# Patient Record
Sex: Female | Born: 1950 | Race: White | Hispanic: No | Marital: Married | Smoking: Former smoker
Health system: Southern US, Community
[De-identification: ages and names within clinical notes are randomized; demographics above are authoritative.]

## PROBLEM LIST (undated history)

## (undated) DIAGNOSIS — I5022 Chronic systolic (congestive) heart failure: Secondary | ICD-10-CM

## (undated) DIAGNOSIS — E079 Disorder of thyroid, unspecified: Secondary | ICD-10-CM

## (undated) DIAGNOSIS — F10920 Alcohol use, unspecified with intoxication, uncomplicated: Secondary | ICD-10-CM

## (undated) DIAGNOSIS — I509 Heart failure, unspecified: Secondary | ICD-10-CM

## (undated) DIAGNOSIS — H269 Unspecified cataract: Secondary | ICD-10-CM

## (undated) DIAGNOSIS — I255 Ischemic cardiomyopathy: Secondary | ICD-10-CM

## (undated) DIAGNOSIS — I214 Non-ST elevation (NSTEMI) myocardial infarction: Secondary | ICD-10-CM

## (undated) DIAGNOSIS — E785 Hyperlipidemia, unspecified: Secondary | ICD-10-CM

## (undated) DIAGNOSIS — S0181XA Laceration without foreign body of other part of head, initial encounter: Secondary | ICD-10-CM

## (undated) DIAGNOSIS — I10 Essential (primary) hypertension: Secondary | ICD-10-CM

## (undated) DIAGNOSIS — Z95810 Presence of automatic (implantable) cardiac defibrillator: Secondary | ICD-10-CM

## (undated) DIAGNOSIS — I200 Unstable angina: Secondary | ICD-10-CM

## (undated) DIAGNOSIS — R55 Syncope and collapse: Secondary | ICD-10-CM

## (undated) DIAGNOSIS — R9431 Abnormal electrocardiogram [ECG] [EKG]: Secondary | ICD-10-CM

## (undated) DIAGNOSIS — E871 Hypo-osmolality and hyponatremia: Secondary | ICD-10-CM

## (undated) DIAGNOSIS — I2510 Atherosclerotic heart disease of native coronary artery without angina pectoris: Secondary | ICD-10-CM

## (undated) DIAGNOSIS — I25118 Atherosclerotic heart disease of native coronary artery with other forms of angina pectoris: Secondary | ICD-10-CM

## (undated) DIAGNOSIS — W19XXXA Unspecified fall, initial encounter: Secondary | ICD-10-CM

## (undated) DIAGNOSIS — T7840XA Allergy, unspecified, initial encounter: Secondary | ICD-10-CM

## (undated) DIAGNOSIS — E039 Hypothyroidism, unspecified: Secondary | ICD-10-CM

## (undated) DIAGNOSIS — R079 Chest pain, unspecified: Secondary | ICD-10-CM

## (undated) HISTORY — DX: Chronic systolic (congestive) heart failure: I50.22

## (undated) HISTORY — DX: Ischemic cardiomyopathy: I25.5

## (undated) HISTORY — DX: Laceration without foreign body of other part of head, initial encounter: S01.81XA

## (undated) HISTORY — DX: Alcohol use, unspecified with intoxication, uncomplicated: F10.920

## (undated) HISTORY — DX: Hypothyroidism, unspecified: E03.9

## (undated) HISTORY — DX: Hypo-osmolality and hyponatremia: E87.1

## (undated) HISTORY — DX: Unspecified cataract: H26.9

## (undated) HISTORY — DX: Unspecified fall, initial encounter: W19.XXXA

## (undated) HISTORY — DX: Heart failure, unspecified: I50.9

## (undated) HISTORY — PX: CORONARY ANGIOPLASTY: SHX604

## (undated) HISTORY — DX: Non-ST elevation (NSTEMI) myocardial infarction: I21.4

## (undated) HISTORY — DX: Allergy, unspecified, initial encounter: T78.40XA

## (undated) HISTORY — DX: Abnormal electrocardiogram (ECG) (EKG): R94.31

## (undated) HISTORY — PX: OTHER SURGICAL HISTORY: SHX169

## (undated) HISTORY — PX: CATARACT EXTRACTION, BILATERAL: SHX1313

## (undated) HISTORY — DX: Syncope and collapse: R55

## (undated) HISTORY — PX: HAND SURGERY: SHX662

## (undated) HISTORY — PX: VAGINAL HYSTERECTOMY: SUR661

## (undated) HISTORY — DX: Unstable angina: I20.0

## (undated) HISTORY — DX: Atherosclerotic heart disease of native coronary artery with other forms of angina pectoris: I25.118

## (undated) HISTORY — DX: Chest pain, unspecified: R07.9

## (undated) HISTORY — DX: Essential (primary) hypertension: I10

---

## 2014-04-13 ENCOUNTER — Emergency Department (HOSPITAL_COMMUNITY)
Admission: EM | Admit: 2014-04-13 | Discharge: 2014-04-13 | Disposition: A | Discharge status: Home / Self Care | Payer: Federal, State, Local not specified - PPO | Attending: Emergency Medicine | Admitting: Emergency Medicine

## 2014-04-13 ENCOUNTER — Encounter (HOSPITAL_COMMUNITY): Payer: Self-pay | Admitting: Emergency Medicine

## 2014-04-13 ENCOUNTER — Emergency Department (HOSPITAL_COMMUNITY): Payer: Federal, State, Local not specified - PPO

## 2014-04-13 DIAGNOSIS — Y9289 Other specified places as the place of occurrence of the external cause: Secondary | ICD-10-CM | POA: Diagnosis not present

## 2014-04-13 DIAGNOSIS — S99919A Unspecified injury of unspecified ankle, initial encounter: Secondary | ICD-10-CM | POA: Diagnosis not present

## 2014-04-13 DIAGNOSIS — Z862 Personal history of diseases of the blood and blood-forming organs and certain disorders involving the immune mechanism: Secondary | ICD-10-CM | POA: Insufficient documentation

## 2014-04-13 DIAGNOSIS — X500XXA Overexertion from strenuous movement or load, initial encounter: Secondary | ICD-10-CM | POA: Diagnosis not present

## 2014-04-13 DIAGNOSIS — F17200 Nicotine dependence, unspecified, uncomplicated: Secondary | ICD-10-CM | POA: Diagnosis not present

## 2014-04-13 DIAGNOSIS — Y9389 Activity, other specified: Secondary | ICD-10-CM | POA: Diagnosis not present

## 2014-04-13 DIAGNOSIS — S8990XA Unspecified injury of unspecified lower leg, initial encounter: Secondary | ICD-10-CM | POA: Insufficient documentation

## 2014-04-13 DIAGNOSIS — R296 Repeated falls: Secondary | ICD-10-CM | POA: Diagnosis not present

## 2014-04-13 DIAGNOSIS — Z8639 Personal history of other endocrine, nutritional and metabolic disease: Secondary | ICD-10-CM | POA: Insufficient documentation

## 2014-04-13 DIAGNOSIS — S99921A Unspecified injury of right foot, initial encounter: Secondary | ICD-10-CM

## 2014-04-13 DIAGNOSIS — S99929A Unspecified injury of unspecified foot, initial encounter: Principal | ICD-10-CM

## 2014-04-13 HISTORY — DX: Disorder of thyroid, unspecified: E07.9

## 2014-04-13 NOTE — ED Provider Notes (Signed)
CSN: [id number]     Arrival date & time 04/13/14  6503 History   First MD Initiated Contact with Patient 04/13/14 0719     Chief Complaint  Patient presents with  . Foot Injury     (Consider location/radiation/quality/duration/timing/severity/associated sxs/prior Treatment) HPI 63 [place] Assessment 63 year old female who presents to the emergency department with chief complaint of right foot injury. Injury occurrence 10 hours ago. Patient states that she stepped wrong getting out of a chair and had an inversion of the right foot. She states that she did not have very much pain at the time of the injury and was able to walk and do normal activities last night. This morning when she awoke she noticed a large hematoma and swelling on the foot. She denies severe pain. She is able to ambulate however states that certain movements give her a small amount of pain. She denies numbness or tingling. She denies previous injury to the foot.  Past Medical History  Diagnosis Date  . Thyroid disease    History reviewed. No pertinent past surgical history. No family history on file. History  Substance Use Topics  . Smoking status: Current Every Day Smoker  . Smokeless tobacco: Not on file  . Alcohol Use: Yes   OB History   Grav Para Term Preterm Abortions TAB SAB Ect Mult Living                 Review of Systems  Ten systems reviewed and are negative for acute change, except as noted in the HPI.    Allergies  Review of patient's allergies indicates no known allergies.  Home Medications   Prior to Admission medications   Not on File   BP 144/62  Pulse 82  Temp(Src) 98.2 F (36.8 C) (Oral)  Resp 16  Ht 5\' 3"  (1.6 m)  Wt 120 lb (54.432 kg)  BMI 21.26 kg/m2  SpO2 95% Physical Exam  Nursing note and vitals reviewed. Constitutional: She is oriented to person, place, and time. She appears well-developed and well-nourished. No distress.  HENT:  Head: Normocephalic and atraumatic.   Eyes: Conjunctivae are normal. No scleral icterus.  Neck: Normal range of motion.  Cardiovascular: Normal rate, regular rhythm and normal heart sounds.  Exam reveals no gallop and no friction rub.   No murmur heard. Pulmonary/Chest: Effort normal and breath sounds normal. No respiratory distress.  Abdominal: Soft. Bowel sounds are normal. She exhibits no distension and no mass. There is no tenderness. There is no guarding.  Musculoskeletal: She exhibits edema and tenderness.  There is edema and tenderness of the proximal dorsum of the Right foot  without overt deformity. FROM and strength. Ambulates without difficulty. NVI  Neurological: She is alert and oriented to person, place, and time.  Skin: Skin is warm and dry. She is not diaphoretic.  Psychiatric: She has a normal mood and affect. Her behavior is normal.    ED Course  Procedures (including critical care time) Labs Review Labs Reviewed - No data to display  Imaging Review Dg Ankle Complete Right  04/13/2014   CLINICAL DATA:  Right ankle following fall  EXAM: RIGHT ANKLE - COMPLETE 3+ VIEW  COMPARISON:  None.  FINDINGS: Mild irregularity of the dorsal navicular is noted on the lateral and may represent small avulsion fracture of uncertain chronicity.  No other fracture, subluxation or dislocation identified.  No focal bony lesions are present.  Soft tissue structures are unremarkable.  IMPRESSION: Possible small avulsion fracture of the  dorsal navicular -age indeterminate. Correlate with pain.   Electronically Signed   By: Hassan Rowan M.D.   On: 04/13/2014 07:55   Dg Foot Complete Right  04/13/2014   CLINICAL DATA:  Right foot pain following fall  EXAM: RIGHT FOOT COMPLETE - 3+ VIEW  COMPARISON:  None.  FINDINGS: There is no evidence of fracture or dislocation. There is no evidence of arthropathy or other focal bone abnormality. Soft tissues are unremarkable.  IMPRESSION: Negative.   Electronically Signed   By: Hassan Rowan M.D.   On:  04/13/2014 07:46     EKG Interpretation None      MDM   Final diagnoses:  Foot injury, right, initial encounter    X-rays shows a small avulsion fracture of the navicular. This requires no intervention. Patient X-Ray negative for obvious fracture or dislocation. Pain managed in ED. Pt advised to follow up with orthopedics if symptoms persist for possibility of missed fracture diagnosis. Patient given Ace wrap while in ED, conservative therapy recommended and discussed. Patient will be dc home & is agreeable with above plan.   Margarita Mail, PA-C 04/13/14 [phone]

## 2014-04-13 NOTE — Discharge Instructions (Signed)
You have an avulsion fracture of the right navicular bone. This should heal on its own in 4-6 weeks without intervention. Follow up with Dr. Doran Durand if your symptoms worsen or do not resolve. The information below care instructions and reasons to seek immediate medical care at the emergency department.  Hematoma A hematoma is a collection of blood under the skin, in an organ, in a body space, in a joint space, or in other tissue. The blood can clot to form a lump that you can see and feel. The lump is often firm and may sometimes become sore and tender. Most hematomas get better in a few days to weeks. However, some hematomas may be serious and require medical care. Hematomas can range in size from very small to very large. CAUSES  A hematoma can be caused by a blunt or penetrating injury. It can also be caused by spontaneous leakage from a blood vessel under the skin. Spontaneous leakage from a blood vessel is more likely to occur in older people, especially those taking blood thinners. Sometimes, a hematoma can develop after certain medical procedures. SIGNS AND SYMPTOMS   A firm lump on the body.  Possible pain and tenderness in the area.  Bruising.Blue, dark blue, purple-red, or yellowish skin may appear at the site of the hematoma if the hematoma is close to the surface of the skin. For hematomas in deeper tissues or body spaces, the signs and symptoms may be subtle. For example, an intra-abdominal hematoma may cause abdominal pain, weakness, fainting, and shortness of breath. An intracranial hematoma may cause a headache or symptoms such as weakness, trouble speaking, or a change in consciousness. DIAGNOSIS  A hematoma can usually be diagnosed based on your medical history and a physical exam. Imaging tests may be needed if your health care provider suspects a hematoma in deeper tissues or body spaces, such as the abdomen, head, or chest. These tests may include ultrasonography or a CT scan.    TREATMENT  Hematomas usually go away on their own over time. Rarely does the blood need to be drained out of the body. Large hematomas or those that may affect vital organs will sometimes need surgical drainage or monitoring. HOME CARE INSTRUCTIONS   Apply ice to the injured area:   Put ice in a plastic bag.   Place a towel between your skin and the bag.   Leave the ice on for 20 minutes, 2-3 times a day for the first 1 to 2 days.   After the first 2 days, switch to using warm compresses on the hematoma.   Elevate the injured area to help decrease pain and swelling. Wrapping the area with an elastic bandage may also be helpful. Compression helps to reduce swelling and promotes shrinking of the hematoma. Make sure the bandage is not wrapped too tight.   If your hematoma is on a lower extremity and is painful, crutches may be helpful for a couple days.   Only take over-the-counter or prescription medicines as directed by your health care provider. SEEK IMMEDIATE MEDICAL CARE IF:   You have increasing pain, or your pain is not controlled with medicine.   You have a fever.   You have worsening swelling or discoloration.   Your skin over the hematoma breaks or starts bleeding.   Your hematoma is in your chest or abdomen and you have weakness, shortness of breath, or a change in consciousness.  Your hematoma is on your scalp (caused by a fall  or injury) and you have a worsening headache or a change in alertness or consciousness. MAKE SURE YOU:   Understand these instructions.  Will watch your condition.  Will get help right away if you are not doing well or get worse. Document Released: 03/01/2004 Document Revised: 03/20/2013 Document Reviewed: 12/26/2012 [facility] Patient Information 2015 [place], [place]. This information is not intended to replace advice given to you by your health care provider. Make sure you discuss any questions you have with your health care  provider.

## 2014-04-13 NOTE — ED Notes (Signed)
The pt fell yesterday and twisted her ankle.  She has pain and swelling with bruising tom her l;ateral foot

## 2014-04-13 NOTE — ED Provider Notes (Signed)
Medical screening examination/treatment/procedure(s) were performed by non-physician practitioner and as supervising physician I was immediately available for consultation/collaboration.   EKG Interpretation None       Richarda Blade, MD 04/13/14 1757

## 2016-07-07 ENCOUNTER — Inpatient Hospital Stay (HOSPITAL_COMMUNITY)
Admission: EM | Admit: 2016-07-07 | Discharge: 2016-07-12 | DRG: 281 | Disposition: A | Discharge status: Home / Self Care | Payer: Medicare Other | Attending: Internal Medicine | Admitting: Internal Medicine

## 2016-07-07 ENCOUNTER — Encounter (HOSPITAL_COMMUNITY): Payer: Self-pay | Admitting: Cardiovascular Disease

## 2016-07-07 ENCOUNTER — Encounter (HOSPITAL_COMMUNITY)
Admission: EM | Disposition: A | Discharge status: Home / Self Care | Payer: Self-pay | Source: Home / Self Care | Attending: Internal Medicine

## 2016-07-07 ENCOUNTER — Emergency Department (HOSPITAL_COMMUNITY): Payer: Medicare Other

## 2016-07-07 DIAGNOSIS — Z841 Family history of disorders of kidney and ureter: Secondary | ICD-10-CM

## 2016-07-07 DIAGNOSIS — Z23 Encounter for immunization: Secondary | ICD-10-CM

## 2016-07-07 DIAGNOSIS — R9431 Abnormal electrocardiogram [ECG] [EKG]: Secondary | ICD-10-CM | POA: Diagnosis not present

## 2016-07-07 DIAGNOSIS — E785 Hyperlipidemia, unspecified: Secondary | ICD-10-CM | POA: Diagnosis present

## 2016-07-07 DIAGNOSIS — I2584 Coronary atherosclerosis due to calcified coronary lesion: Secondary | ICD-10-CM | POA: Diagnosis present

## 2016-07-07 DIAGNOSIS — W19XXXA Unspecified fall, initial encounter: Secondary | ICD-10-CM | POA: Diagnosis present

## 2016-07-07 DIAGNOSIS — E039 Hypothyroidism, unspecified: Secondary | ICD-10-CM | POA: Diagnosis present

## 2016-07-07 DIAGNOSIS — F10229 Alcohol dependence with intoxication, unspecified: Secondary | ICD-10-CM | POA: Diagnosis present

## 2016-07-07 DIAGNOSIS — S022XXA Fracture of nasal bones, initial encounter for closed fracture: Secondary | ICD-10-CM | POA: Diagnosis present

## 2016-07-07 DIAGNOSIS — R55 Syncope and collapse: Secondary | ICD-10-CM | POA: Diagnosis not present

## 2016-07-07 DIAGNOSIS — F10920 Alcohol use, unspecified with intoxication, uncomplicated: Secondary | ICD-10-CM

## 2016-07-07 DIAGNOSIS — Y907 Blood alcohol level of 200-239 mg/100 ml: Secondary | ICD-10-CM | POA: Diagnosis present

## 2016-07-07 DIAGNOSIS — I252 Old myocardial infarction: Secondary | ICD-10-CM

## 2016-07-07 DIAGNOSIS — I5022 Chronic systolic (congestive) heart failure: Secondary | ICD-10-CM | POA: Diagnosis present

## 2016-07-07 DIAGNOSIS — F17210 Nicotine dependence, cigarettes, uncomplicated: Secondary | ICD-10-CM | POA: Diagnosis present

## 2016-07-07 DIAGNOSIS — W010XXA Fall on same level from slipping, tripping and stumbling without subsequent striking against object, initial encounter: Secondary | ICD-10-CM | POA: Diagnosis present

## 2016-07-07 DIAGNOSIS — I25118 Atherosclerotic heart disease of native coronary artery with other forms of angina pectoris: Secondary | ICD-10-CM

## 2016-07-07 DIAGNOSIS — I255 Ischemic cardiomyopathy: Secondary | ICD-10-CM | POA: Diagnosis present

## 2016-07-07 DIAGNOSIS — R4182 Altered mental status, unspecified: Secondary | ICD-10-CM | POA: Diagnosis not present

## 2016-07-07 DIAGNOSIS — I2582 Chronic total occlusion of coronary artery: Secondary | ICD-10-CM | POA: Diagnosis present

## 2016-07-07 DIAGNOSIS — S61412A Laceration without foreign body of left hand, initial encounter: Secondary | ICD-10-CM | POA: Diagnosis present

## 2016-07-07 DIAGNOSIS — S0181XA Laceration without foreign body of other part of head, initial encounter: Secondary | ICD-10-CM

## 2016-07-07 DIAGNOSIS — S0101XA Laceration without foreign body of scalp, initial encounter: Secondary | ICD-10-CM | POA: Diagnosis present

## 2016-07-07 DIAGNOSIS — I214 Non-ST elevation (NSTEMI) myocardial infarction: Secondary | ICD-10-CM | POA: Diagnosis not present

## 2016-07-07 DIAGNOSIS — I2510 Atherosclerotic heart disease of native coronary artery without angina pectoris: Secondary | ICD-10-CM | POA: Diagnosis present

## 2016-07-07 DIAGNOSIS — Z79899 Other long term (current) drug therapy: Secondary | ICD-10-CM

## 2016-07-07 DIAGNOSIS — I21A1 Myocardial infarction type 2: Secondary | ICD-10-CM | POA: Diagnosis present

## 2016-07-07 DIAGNOSIS — E871 Hypo-osmolality and hyponatremia: Secondary | ICD-10-CM

## 2016-07-07 HISTORY — DX: Unspecified fall, initial encounter: W19.XXXA

## 2016-07-07 HISTORY — DX: Hyperlipidemia, unspecified: E78.5

## 2016-07-07 LAB — CBC
HEMATOCRIT: 41.2 % (ref 36.0–46.0)
HEMOGLOBIN: 14.6 g/dL (ref 12.0–15.0)
MCH: 30.9 pg (ref 26.0–34.0)
MCHC: 35.4 g/dL (ref 30.0–36.0)
MCV: 87.3 fL (ref 78.0–100.0)
Platelets: 292 10*3/uL (ref 150–400)
RBC: 4.72 MIL/uL (ref 3.87–5.11)
RDW: 14.6 % (ref 11.5–15.5)
WBC: 14 10*3/uL — ABNORMAL HIGH (ref 4.0–10.5)

## 2016-07-07 LAB — COMPREHENSIVE METABOLIC PANEL
ALK PHOS: 58 U/L (ref 38–126)
ALT: 27 U/L (ref 14–54)
AST: 31 U/L (ref 15–41)
Albumin: 3.8 g/dL (ref 3.5–5.0)
Anion gap: 10 (ref 5–15)
BUN: 7 mg/dL (ref 6–20)
CHLORIDE: 94 mmol/L — AB (ref 101–111)
CO2: 21 mmol/L — AB (ref 22–32)
Calcium: 8.9 mg/dL (ref 8.9–10.3)
Creatinine, Ser: 0.68 mg/dL (ref 0.44–1.00)
GFR calc Af Amer: >60 mL/min (ref >60)
GFR calc non Af Amer: >60 mL/min (ref >60)
Glucose, Bld: 91 mg/dL (ref 65–99)
Potassium: 4.5 mmol/L (ref 3.5–5.1)
Sodium: 125 mmol/L — ABNORMAL LOW (ref 135–145)
Total Bilirubin: 0.8 mg/dL (ref 0.3–1.2)
Total Protein: 6.4 g/dL — ABNORMAL LOW (ref 6.5–8.1)

## 2016-07-07 LAB — DIFFERENTIAL
Basophils Absolute: 0.1 10*3/uL (ref 0.0–0.1)
Basophils Relative: 1 %
EOS ABS: 0.1 10*3/uL (ref 0.0–0.7)
Eosinophils Relative: 1 %
Lymphocytes Relative: 20 %
Lymphs Abs: 2.8 10*3/uL (ref 0.7–4.0)
MONO ABS: 0.7 10*3/uL (ref 0.1–1.0)
MONOS PCT: 5 %
Neutro Abs: 10.4 10*3/uL — ABNORMAL HIGH (ref 1.7–7.7)
Neutrophils Relative %: 73 %

## 2016-07-07 LAB — PROTIME-INR
INR: 1.04
Prothrombin Time: 13.7 seconds (ref 11.4–15.2)

## 2016-07-07 LAB — LIPID PANEL
CHOL/HDL RATIO: 2 ratio
Cholesterol: 174 mg/dL (ref 0–200)
HDL: 87 mg/dL (ref >40)
LDL CALC: 70 mg/dL (ref 0–99)
Triglycerides: 87 mg/dL (ref <150)
VLDL: 17 mg/dL (ref 0–40)

## 2016-07-07 LAB — APTT: APTT: 29 s (ref 24–36)

## 2016-07-07 LAB — TROPONIN I: Troponin I: <0.03 ng/mL (ref <0.03)

## 2016-07-07 LAB — ETHANOL: Alcohol, Ethyl (B): 221 mg/dL — ABNORMAL HIGH (ref <5)

## 2016-07-07 SURGERY — INVASIVE LAB ABORTED CASE

## 2016-07-07 MED ORDER — IOPAMIDOL (ISOVUE-370) INJECTION 76%
INTRAVENOUS | Status: AC
  Filled 2016-07-07: qty 200

## 2016-07-07 MED ORDER — SODIUM CHLORIDE 0.9 % IV SOLN
10.0000 mL/h | INTRAVENOUS | Status: DC
  Administered 2016-07-07: 10 mL/h via INTRAVENOUS

## 2016-07-07 MED ORDER — LIDOCAINE HCL (PF) 1 % IJ SOLN
INTRAMUSCULAR | Status: AC
  Filled 2016-07-07: qty 30

## 2016-07-07 MED ORDER — HEPARIN (PORCINE) IN NACL 2-0.9 UNIT/ML-% IJ SOLN
INTRAMUSCULAR | Status: AC
  Filled 2016-07-07: qty 1000

## 2016-07-07 MED ORDER — LIDOCAINE HCL (PF) 1 % IJ SOLN
30.0000 mL | Freq: Once | INTRAMUSCULAR | Status: AC
  Administered 2016-07-07: 30 mL via SUBCUTANEOUS
  Filled 2016-07-07: qty 30

## 2016-07-07 MED ORDER — NITROGLYCERIN 1 MG/10 ML FOR IR/CATH LAB
INTRA_ARTERIAL | Status: AC
  Filled 2016-07-07: qty 10

## 2016-07-07 MED ORDER — TETANUS-DIPHTH-ACELL PERTUSSIS 5-2.5-18.5 LF-MCG/0.5 IM SUSP
0.5000 mL | Freq: Once | INTRAMUSCULAR | Status: AC
  Administered 2016-07-07: 0.5 mL via INTRAMUSCULAR
  Filled 2016-07-07: qty 0.5

## 2016-07-07 MED ORDER — VERAPAMIL HCL 2.5 MG/ML IV SOLN
INTRAVENOUS | Status: AC
  Filled 2016-07-07: qty 2

## 2016-07-07 MED ORDER — ASPIRIN 325 MG PO TABS
325.0000 mg | ORAL_TABLET | Freq: Every day | ORAL | Status: DC
  Administered 2016-07-07 – 2016-07-08 (×2): 325 mg via ORAL
  Filled 2016-07-07 (×2): qty 1

## 2016-07-07 NOTE — ED Notes (Signed)
Pt placed on bedpan

## 2016-07-07 NOTE — ED Notes (Signed)
[facility] contacted to activate Code Stemi 

## 2016-07-07 NOTE — ED Triage Notes (Signed)
Patient came in [place] post fall. Patient had a few beers and shots. Patient was found down on sidewalk supine by husband. Unknown how long patient was down. Unknown LOC. Patient has no complaints of pain. EMS ekg showed elevated in v3, v4. Patient has trauma to her nose and forehead, abrasions to wrists and right knee. Patient confused but alert. Not on blood thinners.

## 2016-07-07 NOTE — Consult Note (Signed)
Patient ID: Pam [name] MRN: [id number]  Admit date: 07/07/2016  Primary Care Physician: Pcp Not In System  Primary Cardiologist: New  HPI: 65 yo female with history of tobacco abuse, HLD, hypothyroidism who presented to the ED via EMS with altered mental status, etoh intoxication. EKG with ST elevation precordial leads. Pt without chest pain or dyspnea. Slurred speech but oriented. Husband is present. He states she had been drinking. She went outside to carry out the trash and did not come back in. 15 minutes later he found her on the ground outside. She was awake but moaning. She has smoked 1 ppd for 50 years. Upon arrival to ED, head laceration noted. Pt taken to CT scanner for stat head CT. Head CT with no evidence of intracranial bleeding. Preliminary read by radiology with no cervical spine fractures. Her nose is broken. Her face is covered in blood. No prior cardiac issues.   During my exam, she has no complaints except for headache. No chest pain or dyspnea   Review of systems complete and found to be negative unless listed above   Past Medical History:  Diagnosis Date  . Hyperlipidemia   . Thyroid disease     Family History  Problem Relation Age of Onset  . Kidney failure Mother     Social History   Social History  . Marital status: Married    Spouse name: N/A  . Number of children: 2  . Years of education: N/A   Occupational History  . Retired-worked for [employer]    Social History Main Topics  . Smoking status: Current Every Day Smoker    Packs/day: 1.00    Years: 50.00    Types: Cigarettes  . Smokeless tobacco: Not on file  . Alcohol use Yes     Comment: she drinks 2-3 days per week  . Drug use: No  . Sexual activity: Not on file   Other Topics Concern  . Not on file   Social History Narrative  . No narrative on file    Past Surgical History:  Procedure Laterality Date  . CATARACT EXTRACTION, BILATERAL      No Known Allergies  Prior to  Admission Meds:  Lipitor Synthroid  Physical Exam: Blood pressure 140/83, pulse 83, resp. rate 14, SpO2 94 %.    General: Well developed, well nourished, awake.  HEENT: OP clear, mucus membranes moist  SKIN: warm, dry. No rashes. Laceration on forehead.  Neuro: She moves her fingers and toes Musculoskeletal: Moves all extremities Psychiatric: Mood and affect normal  Neck: No JVD, no carotid bruits, no thyromegaly, no lymphadenopathy.  Lungs:Clear bilaterally, no wheezes, rhonci, crackles  Cardiovascular: Regular rate and rhythm. No murmurs, gallops or rubs.  Abdomen:Soft. Bowel sounds present. Non-tender.  Extremities: No lower extremity edema. Pulses are 2 + in the bilateral DP/PT.   Labs:   Lab Results  Component Value Date   WBC 14.0 (H) 07/07/2016   HGB 14.6 07/07/2016   HCT 41.2 07/07/2016   MCV 87.3 07/07/2016   PLT 292 07/07/2016     CMP Latest Ref Rng & Units 07/07/2016  Glucose 65 - 99 mg/dL 91  BUN 6 - 20 mg/dL 7  Creatinine 0.44 - 1.00 mg/dL 0.68  Sodium 135 - 145 mmol/L 125(L)  Potassium 3.5 - 5.1 mmol/L 4.5  Chloride 101 - 111 mmol/L 94(L)  CO2 22 - 32 mmol/L 21(L)  Calcium 8.9 - 10.3 mg/dL 8.9  Total Protein 6.5 - 8.1  g/dL 6.4(L)  Total Bilirubin 0.3 - 1.2 mg/dL 0.8  Alkaline Phos 38 - 126 U/L 58  AST 15 - 41 U/L 31  ALT 14 - 54 U/L 27   Troponin: <0.03  EKG: sinus, poor R wave progression through the precordial leads with 0.5 mm ST elevation leads V2-V4.   ASSESSMENT AND PLAN:   1. Abnormal EKG/syncopal event: The patient was found on the ground at home by her husband. She had been drinking heavily tonight. She has had no recent illnesses. Her first EKG by EMS showed ST elevation in leads V2-V4. Code STEMI was called by the ED staff. Upon arrival the ST segment elevation has almost normalized. She is currently in a cervical collar and her c-spine has not yet been cleared. Her face is covered in blood from a forehead laceration. She has no chest pain or  dyspnea. Blood pressure is normal. No ectopy on telemetry. I cannot exclude ACS fully but it is reassuring that her first troponin which was drawn 60 minutes after her event is negative. I have spent 60 minutes in the ED tonight at the patients bedside speaking to her and her husband. She has a high probability of CAD given her history of heavy tobacco abuse. She does not currently have an indication for emergent cardiac cath. She is not an ideal candidate for cath right now anyway given the other issues following her fall.  This could have been a primary arrhythmic event related to coronary vasospasm.  I would have the IM team admit to telemetry. Follow serial cardiac enzymes and serial EKGs.  No heparin right now given her traumatic injuries.  Echo in the am. She may end up needing a cardiac cath before discharge.   Code stemi cancelled.   Darlina Guys, MD 07/07/2016, 8:29 PM

## 2016-07-07 NOTE — ED Notes (Signed)
Gold color ring and gold color earrings removed and given to spouse.

## 2016-07-07 NOTE — ED Provider Notes (Signed)
[facility] DEPT Provider Note   CSN: [id number] Arrival date & time: 07/07/16  [id number]     History   Chief Complaint Chief Complaint  Patient presents with  . Fall  . Facial Injury    HPI Pam [name] is a 65 y.o. female.  HPI  Patient is a 65 year old female who presents via EMS after a fall with possible syncope. Patient is very intoxicated on arrival and unable to provide further history. History obtained from EMS and the patient's husband. Per report, patient had had several shots of alcohol and then attempted to take out the trash. Husband realized he had not seen patient for about 15 minutes, went in search of her and found her lying on her back in the yard with bleeding facial lacerations. She was alert, but confused and unable to get up off the ground. EMS was called. EKG performed by EMS prior to arrival was concerning for possible ST elevations in anterior and lateral leads. Cardiology was alerted prior to patient arrival. On presentation patient is oriented to self only. She admits to drinking alcohol but is amnestic to the other events of today.  Past Medical History:  Diagnosis Date  . Hyperlipidemia   . Thyroid disease     Patient Active Problem List   Diagnosis Date Noted  . Fall 07/07/2016  . Abnormal EKG   . Syncope     Past Surgical History:  Procedure Laterality Date  . ABDOMINAL HYSTERECTOMY    . CATARACT EXTRACTION, BILATERAL      OB History    No data available       Home Medications    Prior to Admission medications   Medication Sig Start Date End Date Taking? Authorizing Provider  atorvastatin (LIPITOR) 40 MG tablet Take 40 mg by mouth daily.   Yes Historical Provider, MD  levothyroxine (SYNTHROID, LEVOTHROID) 25 MCG tablet Take 25 mcg by mouth daily before breakfast.   Yes Historical Provider, MD    Family History Family History  Problem Relation Age of Onset  . Kidney failure Mother     Social History Social History  Substance  Use Topics  . Smoking status: Current Every Day Smoker    Packs/day: 1.00    Years: 50.00    Types: Cigarettes  . Smokeless tobacco: Never Used  . Alcohol use Yes     Comment: she drinks 2-3 days per week     Allergies   Patient has no known allergies.   Review of Systems Review of Systems  Skin: Positive for wound.   ROS limited due to patient intoxicated on arrival.   Physical Exam Updated Vital Signs BP 132/83   Pulse 87   Resp 16   Ht 5\' 3"  (1.6 m)   Wt 54.9 kg   SpO2 94%   BMI 21.43 kg/m   Physical Exam  Constitutional:  Alert, smells strongly of alcohol.   HENT:  Head: Normocephalic.  Abrasion over left and middle forehead with venous oozing. Small irregular laceration over bridge of nose, also with minimal venous oozing.   Eyes: EOM are normal. Pupils are equal, round, and reactive to light.  Neck:  Cervical collar in place  Cardiovascular: Normal rate, regular rhythm, normal heart sounds and intact distal pulses.   No murmur heard. Pulmonary/Chest: Effort normal and breath sounds normal. No respiratory distress. She has no wheezes. She has no rales. She exhibits no tenderness.  Abdominal: Soft. She exhibits no distension. There is no tenderness.  Musculoskeletal:  Abrasions over bilateral dorsal hands. Small 1cm avulson type laceration over dorsum of left hand. Scattered abrasions on bilateral lower extremities.  No bony deformity or TTP over extremities. Pelvis stable to AP/lateral compression. No stepoffs on spine exam.   Neurological: She is alert.  Oriented to self only. Speech is slurred and patient appears intoxicated. At least 3/5 strength in all extremities. Sensation intact to light touch throughout.   Skin: Skin is warm and dry. Capillary refill takes less than 2 seconds.     ED Treatments / Results  Labs (all labs ordered are listed, but only abnormal results are displayed) Labs Reviewed  CBC - Abnormal; Notable for the following:        Result Value   WBC 14.0 (*)    All other components within normal limits  DIFFERENTIAL - Abnormal; Notable for the following:    Neutro Abs 10.4 (*)    All other components within normal limits  COMPREHENSIVE METABOLIC PANEL - Abnormal; Notable for the following:    Sodium 125 (*)    Chloride 94 (*)    CO2 21 (*)    Total Protein 6.4 (*)    All other components within normal limits  ETHANOL - Abnormal; Notable for the following:    Alcohol, Ethyl (B) 221 (*)    All other components within normal limits  PROTIME-INR  APTT  TROPONIN I  LIPID PANEL    EKG  EKG Interpretation  Date/Time:  Thursday July 07 2016 19:12:02 EST Ventricular Rate:  89 PR Interval:    QRS Duration: 103 QT Interval:  400 QTC Calculation: 487 R Axis:   78 Text Interpretation:  Atrial fibrillation Probable anteroseptal infarct, recent Lateral leads are also involved Baseline wander in lead(s) V6 ** ** ACUTE MI / STEMI ** ** Confirmed by LONG MD, JOSHUA [phone]) on 07/07/2016 7:16:25 PM       Radiology Ct Head Wo Contrast  Result Date: 07/07/2016 CLINICAL DATA:  Patient found down on the ground after alcohol consumption. EXAM: CT HEAD WITHOUT CONTRAST CT MAXILLOFACIAL WITHOUT CONTRAST CT CERVICAL SPINE WITHOUT CONTRAST TECHNIQUE: Multidetector CT imaging of the head, cervical spine, and maxillofacial structures were performed using the standard protocol without intravenous contrast. Multiplanar CT image reconstructions of the cervical spine and maxillofacial structures were also generated. COMPARISON:  None. FINDINGS: CT HEAD FINDINGS Brain: No evidence of acute infarction, hemorrhage, hydrocephalus, extra-axial collection or mass lesion/mass effect. Mild generalized atrophy. Vascular: Atherosclerosis of skullbase vasculature without hyperdense vessel or abnormal calcification. Skull: Right frontal scalp hematoma without calvarial fracture. Other: None. CT MAXILLOFACIAL FINDINGS Osseous: Nasal bone fracture,  mildly displaced. There is mild rightward nasal septal swelling with possible fracture posteriorly, no nasal septal hematoma. No additional facial bone fracture. Zygomatic arches are intact. No mandibular fracture, temporomandibular joints are congruent. Orbits: Right supraorbital soft tissue edema. No orbital fracture. Both globes are intact. Sinuses: Clear. Soft tissues: Soft tissue edema about the chin. CT CERVICAL SPINE FINDINGS Alignment: Straightening of normal lordosis. No listhesis, jumped or perched facets. Skull base and vertebrae: No acute fracture. No primary bone lesion or focal pathologic process. Soft tissues and spinal canal: No prevertebral fluid or swelling. No visible canal hematoma. Disc levels: Multilevel disc space narrowing and endplate spurring is most prominent at C5-C6 and C6-C7. There is multilevel facet arthropathy. Upper chest: Emphysema, advanced. Azygos fissure is incidentally noted. Other: Carotid atherosclerosis. IMPRESSION: 1. No acute intracranial abnormality. Right frontal scalp hematoma without skull fracture. 2. Minimally depressed nasal bone fracture.  No additional facial bone fracture. 3. Degenerative change throughout cervical spine. No acute fracture or subluxation. 4. Emphysema noted at the lung apices. 5. Carotid and intracranial atherosclerosis. Electronically Signed   By: Jeb Levering M.D.   On: 07/07/2016 20:38   Ct Cervical Spine Wo Contrast  Result Date: 07/07/2016 CLINICAL DATA:  Patient found down on the ground after alcohol consumption. EXAM: CT HEAD WITHOUT CONTRAST CT MAXILLOFACIAL WITHOUT CONTRAST CT CERVICAL SPINE WITHOUT CONTRAST TECHNIQUE: Multidetector CT imaging of the head, cervical spine, and maxillofacial structures were performed using the standard protocol without intravenous contrast. Multiplanar CT image reconstructions of the cervical spine and maxillofacial structures were also generated. COMPARISON:  None. FINDINGS: CT HEAD FINDINGS Brain:  No evidence of acute infarction, hemorrhage, hydrocephalus, extra-axial collection or mass lesion/mass effect. Mild generalized atrophy. Vascular: Atherosclerosis of skullbase vasculature without hyperdense vessel or abnormal calcification. Skull: Right frontal scalp hematoma without calvarial fracture. Other: None. CT MAXILLOFACIAL FINDINGS Osseous: Nasal bone fracture, mildly displaced. There is mild rightward nasal septal swelling with possible fracture posteriorly, no nasal septal hematoma. No additional facial bone fracture. Zygomatic arches are intact. No mandibular fracture, temporomandibular joints are congruent. Orbits: Right supraorbital soft tissue edema. No orbital fracture. Both globes are intact. Sinuses: Clear. Soft tissues: Soft tissue edema about the chin. CT CERVICAL SPINE FINDINGS Alignment: Straightening of normal lordosis. No listhesis, jumped or perched facets. Skull base and vertebrae: No acute fracture. No primary bone lesion or focal pathologic process. Soft tissues and spinal canal: No prevertebral fluid or swelling. No visible canal hematoma. Disc levels: Multilevel disc space narrowing and endplate spurring is most prominent at C5-C6 and C6-C7. There is multilevel facet arthropathy. Upper chest: Emphysema, advanced. Azygos fissure is incidentally noted. Other: Carotid atherosclerosis. IMPRESSION: 1. No acute intracranial abnormality. Right frontal scalp hematoma without skull fracture. 2. Minimally depressed nasal bone fracture. No additional facial bone fracture. 3. Degenerative change throughout cervical spine. No acute fracture or subluxation. 4. Emphysema noted at the lung apices. 5. Carotid and intracranial atherosclerosis. Electronically Signed   By: Jeb Levering M.D.   On: 07/07/2016 20:38   Ct Maxillofacial Wo Contrast  Result Date: 07/07/2016 CLINICAL DATA:  Patient found down on the ground after alcohol consumption. EXAM: CT HEAD WITHOUT CONTRAST CT MAXILLOFACIAL WITHOUT  CONTRAST CT CERVICAL SPINE WITHOUT CONTRAST TECHNIQUE: Multidetector CT imaging of the head, cervical spine, and maxillofacial structures were performed using the standard protocol without intravenous contrast. Multiplanar CT image reconstructions of the cervical spine and maxillofacial structures were also generated. COMPARISON:  None. FINDINGS: CT HEAD FINDINGS Brain: No evidence of acute infarction, hemorrhage, hydrocephalus, extra-axial collection or mass lesion/mass effect. Mild generalized atrophy. Vascular: Atherosclerosis of skullbase vasculature without hyperdense vessel or abnormal calcification. Skull: Right frontal scalp hematoma without calvarial fracture. Other: None. CT MAXILLOFACIAL FINDINGS Osseous: Nasal bone fracture, mildly displaced. There is mild rightward nasal septal swelling with possible fracture posteriorly, no nasal septal hematoma. No additional facial bone fracture. Zygomatic arches are intact. No mandibular fracture, temporomandibular joints are congruent. Orbits: Right supraorbital soft tissue edema. No orbital fracture. Both globes are intact. Sinuses: Clear. Soft tissues: Soft tissue edema about the chin. CT CERVICAL SPINE FINDINGS Alignment: Straightening of normal lordosis. No listhesis, jumped or perched facets. Skull base and vertebrae: No acute fracture. No primary bone lesion or focal pathologic process. Soft tissues and spinal canal: No prevertebral fluid or swelling. No visible canal hematoma. Disc levels: Multilevel disc space narrowing and endplate spurring is most prominent at C5-C6 and  C6-C7. There is multilevel facet arthropathy. Upper chest: Emphysema, advanced. Azygos fissure is incidentally noted. Other: Carotid atherosclerosis. IMPRESSION: 1. No acute intracranial abnormality. Right frontal scalp hematoma without skull fracture. 2. Minimally depressed nasal bone fracture. No additional facial bone fracture. 3. Degenerative change throughout cervical spine. No acute  fracture or subluxation. 4. Emphysema noted at the lung apices. 5. Carotid and intracranial atherosclerosis. Electronically Signed   By: Jeb Levering M.D.   On: 07/07/2016 20:38    Procedures .Marland KitchenLaceration Repair Date/Time: 07/08/2016 12:42 AM Performed by: Tamala Julian, Priya Matsen B Authorized by: Darnelle Bos B   Consent:    Consent obtained:  Verbal   Consent given by:  Patient Anesthesia (see MAR for exact dosages):    Anesthesia method:  Local infiltration   Local anesthetic:  Lidocaine 1% w/o epi Laceration details:    Location:  Face   Face location:  Nose   Length (cm):  3 Repair type:    Repair type:  Simple Pre-procedure details:    Preparation:  Patient was prepped and draped in usual sterile fashion Exploration:    Contaminated: no   Treatment:    Area cleansed with:  Saline   Amount of cleaning:  Standard   Irrigation solution:  Sterile saline and tap water   Irrigation method:  Syringe Skin repair:    Repair method:  Sutures   Suture size:  5-0   Suture material:  Nylon   Suture technique:  Simple interrupted   Number of sutures:  3 Approximation:    Approximation:  Close Post-procedure details:    Patient tolerance of procedure:  Tolerated well, no immediate complications .Marland KitchenLaceration Repair Date/Time: 07/08/2016 12:43 AM Performed by: Tamala Julian, Vaeda Westall B Authorized by: Darnelle Bos B   Consent:    Consent obtained:  Verbal   Consent given by:  Patient Laceration details:    Location:  Hand   Hand location:  L hand, dorsum   Length (cm):  1 Repair type:    Repair type:  Simple Pre-procedure details:    Preparation:  Patient was prepped and draped in usual sterile fashion Treatment:    Area cleansed with:  Saline   Amount of cleaning:  Standard   Irrigation solution:  Sterile saline   Irrigation method:  Syringe Skin repair:    Repair method:  Sutures   Suture size:  4-0   Suture material:  Prolene   Number of sutures:  1 Approximation:    Approximation:   Loose Post-procedure details:    Patient tolerance of procedure:  Tolerated well, no immediate complications   (including critical care time)  Medications Ordered in ED Medications  0.9 %  sodium chloride infusion (10 mL/hr Intravenous New Bag/Given 07/07/16 1956)  aspirin tablet 325 mg (325 mg Oral Given 07/07/16 2158)  Tdap (BOOSTRIX) injection 0.5 mL (0.5 mLs Intramuscular Given 07/07/16 1951)  lidocaine (PF) (XYLOCAINE) 1 % injection 30 mL (30 mLs Subcutaneous Given 07/07/16 2245)     Initial Impression / Assessment and Plan / ED Course  I have reviewed the triage vital signs and the nursing notes.  Pertinent labs & imaging results that were available during my care of the patient were reviewed by me and considered in my medical decision making (see chart for details).  Clinical Course    The patient is a 65 year old female possible history of hypokalemia and thyroid disease who presents with abnormal EKG and facial lacerations after a fall as described above. Unclear if syncopal episode or mechanical  fall due to intoxication. EKG obtained by EMS prior to arrival was concerning for ST elevations in anterior and lateral leads. Cardiology (Dr. Angelena Form) consulted prior to patient arrival.  On presentation patient is clearly intoxicated, with lacerations over nose and forehead as described above. She has scattered abrasions on her studies were no other signs of trauma. She denies chest pain but is unreliable due to severe intoxication. EKG was repeated and continues to show possible ST elevations in V2, V3 V4 but with considerable artifact. Cardiology reviewed EKG. As patient had a fall with obvious facial injuries recommended stat head CT to rule out intracranial bleed prior to proceeding with a cardiac intervention. CT head, face and cervical spine obtained. Positive for minimally depressed nasal bone fracture but no intracranial or spinal injuries. ASA given. EKG repeated and shows resolved  changes. Initial troponin undetectable. Given these results cardiology did not recommend acute intervention. Advised to admit patient for telemetry monitoring and trending cardiac enzymes.  Facial lacerations and left hand repaired as per procedure above. Will require suture removal in 5-7 days. Other labs reviewed - significant for elevated ETOH, mild hyponatremia. Leukocytosis likely associated with trauma.  [facility] hospitalist consulted for admission. Requested discussion of patient with trauma and ENT. Per trauma surgery discussion, no further interventions. Patient was discussed with Dr. Redmond Baseman (ENT) who does not recommend any acute intervention for nasal bone fracture. Patient will require outpatient follow-up with ENT after discharge for further evaluation. Patient will be admitted to the hospitalist service for telemetry observation and further cardiac rule out.   Patient seen and discussed with Dr. Laverta Baltimore, ED attending  Final Clinical Impressions(s) / ED Diagnoses   Final diagnoses:  Fall, initial encounter  Facial laceration, initial encounter  Alcoholic intoxication without complication ([place])  Hyponatremia    New Prescriptions New Prescriptions   No medications on file     Gibson Ramp, MD 07/08/16 [place], MD 07/08/16 [phone]

## 2016-07-07 NOTE — Progress Notes (Signed)
   07/07/16 1930  Clinical Encounter Type  Visited With Family  Visit Type ED  Spiritual Encounters  Spiritual Needs Emotional;Prayer  Stress Factors  Patient Stress Factors Not reviewed  Family Stress Factors Family relationships  Introduction to spouse. Pt. At procedure. Offered blessing and emotional support.

## 2016-07-07 NOTE — ED Notes (Signed)
PT ring still on Pt left ring-finger

## 2016-07-07 NOTE — ED Notes (Signed)
Po fluids provided.  

## 2016-07-08 ENCOUNTER — Encounter (HOSPITAL_COMMUNITY)
Admission: EM | Disposition: A | Discharge status: Home / Self Care | Payer: Self-pay | Source: Home / Self Care | Attending: Internal Medicine

## 2016-07-08 ENCOUNTER — Encounter (HOSPITAL_COMMUNITY): Payer: Self-pay | Admitting: Internal Medicine

## 2016-07-08 ENCOUNTER — Observation Stay (HOSPITAL_BASED_OUTPATIENT_CLINIC_OR_DEPARTMENT_OTHER): Payer: Medicare Other

## 2016-07-08 DIAGNOSIS — S0003XA Contusion of scalp, initial encounter: Secondary | ICD-10-CM

## 2016-07-08 DIAGNOSIS — E782 Mixed hyperlipidemia: Secondary | ICD-10-CM | POA: Diagnosis not present

## 2016-07-08 DIAGNOSIS — E039 Hypothyroidism, unspecified: Secondary | ICD-10-CM

## 2016-07-08 DIAGNOSIS — I214 Non-ST elevation (NSTEMI) myocardial infarction: Secondary | ICD-10-CM | POA: Diagnosis not present

## 2016-07-08 DIAGNOSIS — E7800 Pure hypercholesterolemia, unspecified: Secondary | ICD-10-CM | POA: Diagnosis not present

## 2016-07-08 DIAGNOSIS — W19XXXA Unspecified fall, initial encounter: Secondary | ICD-10-CM | POA: Diagnosis not present

## 2016-07-08 DIAGNOSIS — R9431 Abnormal electrocardiogram [ECG] [EKG]: Secondary | ICD-10-CM

## 2016-07-08 DIAGNOSIS — S022XXA Fracture of nasal bones, initial encounter for closed fracture: Secondary | ICD-10-CM

## 2016-07-08 DIAGNOSIS — E785 Hyperlipidemia, unspecified: Secondary | ICD-10-CM | POA: Diagnosis present

## 2016-07-08 DIAGNOSIS — F10920 Alcohol use, unspecified with intoxication, uncomplicated: Secondary | ICD-10-CM | POA: Insufficient documentation

## 2016-07-08 DIAGNOSIS — S61412A Laceration without foreign body of left hand, initial encounter: Secondary | ICD-10-CM | POA: Diagnosis not present

## 2016-07-08 DIAGNOSIS — I2510 Atherosclerotic heart disease of native coronary artery without angina pectoris: Secondary | ICD-10-CM | POA: Diagnosis not present

## 2016-07-08 HISTORY — PX: CARDIAC CATHETERIZATION: SHX172

## 2016-07-08 HISTORY — DX: Hypothyroidism, unspecified: E03.9

## 2016-07-08 LAB — BASIC METABOLIC PANEL
Anion gap: 12 (ref 5–15)
BUN: 5 mg/dL — AB (ref 6–20)
CALCIUM: 8.8 mg/dL — AB (ref 8.9–10.3)
CHLORIDE: 100 mmol/L — AB (ref 101–111)
CO2: 19 mmol/L — ABNORMAL LOW (ref 22–32)
CREATININE: 0.6 mg/dL (ref 0.44–1.00)
Glucose, Bld: 87 mg/dL (ref 65–99)
Potassium: 3.9 mmol/L (ref 3.5–5.1)
SODIUM: 131 mmol/L — AB (ref 135–145)

## 2016-07-08 LAB — CBC
HEMATOCRIT: 39.7 % (ref 36.0–46.0)
Hemoglobin: 14 g/dL (ref 12.0–15.0)
MCH: 30.3 pg (ref 26.0–34.0)
MCHC: 35.3 g/dL (ref 30.0–36.0)
MCV: 85.9 fL (ref 78.0–100.0)
PLATELETS: 273 10*3/uL (ref 150–400)
RBC: 4.62 MIL/uL (ref 3.87–5.11)
RDW: 14.6 % (ref 11.5–15.5)
WBC: 12.5 10*3/uL — AB (ref 4.0–10.5)

## 2016-07-08 LAB — TROPONIN I
TROPONIN I: 0.1 ng/mL — AB (ref <0.03)
TROPONIN I: 0.17 ng/mL — AB (ref <0.03)
TROPONIN I: 0.27 ng/mL — AB (ref <0.03)
TROPONIN I: 0.2 ng/mL — AB (ref <0.03)

## 2016-07-08 LAB — RAPID URINE DRUG SCREEN, HOSP PERFORMED
Amphetamines: NOT DETECTED
BARBITURATES: NOT DETECTED
Benzodiazepines: NOT DETECTED
Cocaine: NOT DETECTED
Opiates: NOT DETECTED
TETRAHYDROCANNABINOL: NOT DETECTED

## 2016-07-08 LAB — OSMOLALITY: OSMOLALITY: 273 mosm/kg — AB (ref 275–295)

## 2016-07-08 LAB — TSH: TSH: 2.454 u[IU]/mL (ref 0.350–4.500)

## 2016-07-08 LAB — ECHOCARDIOGRAM COMPLETE
Height: 63 in
WEIGHTICAEL: 1936 [oz_av]

## 2016-07-08 LAB — SODIUM, URINE, RANDOM: SODIUM UR: 56 mmol/L

## 2016-07-08 LAB — OSMOLALITY, URINE: Osmolality, Ur: 219 mOsm/kg — ABNORMAL LOW (ref 300–900)

## 2016-07-08 SURGERY — LEFT HEART CATH AND CORONARY ANGIOGRAPHY
Anesthesia: Moderate Sedation

## 2016-07-08 SURGERY — LEFT HEART CATH AND CORONARY ANGIOGRAPHY
Anesthesia: LOCAL

## 2016-07-08 MED ORDER — SODIUM CHLORIDE 0.9% FLUSH
3.0000 mL | Freq: Two times a day (BID) | INTRAVENOUS | Status: DC
  Administered 2016-07-08 – 2016-07-12 (×8): 3 mL via INTRAVENOUS

## 2016-07-08 MED ORDER — HEPARIN (PORCINE) IN NACL 100-0.45 UNIT/ML-% IJ SOLN
950.0000 [IU]/h | INTRAMUSCULAR | Status: DC
  Administered 2016-07-09: 700 [IU]/h via INTRAVENOUS
  Administered 2016-07-10 – 2016-07-11 (×2): 1050 [IU]/h via INTRAVENOUS
  Filled 2016-07-08 (×3): qty 250

## 2016-07-08 MED ORDER — FOLIC ACID 1 MG PO TABS
1.0000 mg | ORAL_TABLET | Freq: Every day | ORAL | Status: DC
  Administered 2016-07-08 – 2016-07-12 (×5): 1 mg via ORAL
  Filled 2016-07-08 (×6): qty 1

## 2016-07-08 MED ORDER — SODIUM CHLORIDE 0.9% FLUSH: 3.0000 mL | Freq: Two times a day (BID) | INTRAVENOUS | Status: DC

## 2016-07-08 MED ORDER — ONDANSETRON HCL 4 MG PO TABS: 4.0000 mg | ORAL_TABLET | Freq: Four times a day (QID) | ORAL | Status: DC | PRN

## 2016-07-08 MED ORDER — LORAZEPAM 1 MG PO TABS: 0.0000 mg | ORAL_TABLET | Freq: Two times a day (BID) | ORAL | Status: AC

## 2016-07-08 MED ORDER — IOPAMIDOL (ISOVUE-370) INJECTION 76%
INTRAVENOUS | Status: AC
  Filled 2016-07-08: qty 100

## 2016-07-08 MED ORDER — LORAZEPAM 1 MG PO TABS: 0.0000 mg | ORAL_TABLET | Freq: Four times a day (QID) | ORAL | Status: AC

## 2016-07-08 MED ORDER — ASPIRIN 81 MG PO CHEW
81.0000 mg | CHEWABLE_TABLET | Freq: Every day | ORAL | Status: DC
  Administered 2016-07-09 – 2016-07-12 (×4): 81 mg via ORAL
  Filled 2016-07-08 (×4): qty 1

## 2016-07-08 MED ORDER — THIAMINE HCL 100 MG/ML IJ SOLN: 100.0000 mg | Freq: Every day | INTRAMUSCULAR | Status: DC

## 2016-07-08 MED ORDER — ACETAMINOPHEN 650 MG RE SUPP: 650.0000 mg | Freq: Four times a day (QID) | RECTAL | Status: DC | PRN

## 2016-07-08 MED ORDER — VERAPAMIL HCL 2.5 MG/ML IV SOLN
INTRAVENOUS | Status: DC | PRN
  Administered 2016-07-08: 10 mL via INTRA_ARTERIAL

## 2016-07-08 MED ORDER — LORAZEPAM 2 MG/ML IJ SOLN: 1.0000 mg | Freq: Four times a day (QID) | INTRAMUSCULAR | Status: AC | PRN

## 2016-07-08 MED ORDER — OXYCODONE HCL 5 MG PO TABS
5.0000 mg | ORAL_TABLET | Freq: Once | ORAL | Status: AC
  Administered 2016-07-08: 5 mg via ORAL
  Filled 2016-07-08: qty 1

## 2016-07-08 MED ORDER — LIDOCAINE HCL (PF) 1 % IJ SOLN
INTRAMUSCULAR | Status: AC
  Filled 2016-07-08: qty 30

## 2016-07-08 MED ORDER — PERFLUTREN LIPID MICROSPHERE
1.0000 mL | INTRAVENOUS | Status: AC | PRN
  Administered 2016-07-08: 2 mL via INTRAVENOUS
  Filled 2016-07-08: qty 10

## 2016-07-08 MED ORDER — CARVEDILOL 3.125 MG PO TABS
3.1250 mg | ORAL_TABLET | Freq: Two times a day (BID) | ORAL | Status: DC
  Administered 2016-07-08 – 2016-07-12 (×7): 3.125 mg via ORAL
  Filled 2016-07-08 (×8): qty 1

## 2016-07-08 MED ORDER — HEPARIN SODIUM (PORCINE) 1000 UNIT/ML IJ SOLN
INTRAMUSCULAR | Status: AC
  Filled 2016-07-08: qty 1

## 2016-07-08 MED ORDER — SODIUM CHLORIDE 0.9% FLUSH: 3.0000 mL | INTRAVENOUS | Status: DC | PRN

## 2016-07-08 MED ORDER — SODIUM CHLORIDE 0.9 % IV SOLN
INTRAVENOUS | Status: AC
  Administered 2016-07-08: 1000 mL via INTRAVENOUS

## 2016-07-08 MED ORDER — IOPAMIDOL (ISOVUE-370) INJECTION 76%
INTRAVENOUS | Status: DC | PRN
  Administered 2016-07-08: 60 mL via INTRA_ARTERIAL

## 2016-07-08 MED ORDER — ONDANSETRON HCL 4 MG/2ML IJ SOLN
4.0000 mg | Freq: Four times a day (QID) | INTRAMUSCULAR | Status: DC | PRN
Start: 2016-07-08 — End: 2016-07-12
  Administered 2016-07-08: 4 mg via INTRAVENOUS
  Filled 2016-07-08: qty 2

## 2016-07-08 MED ORDER — HEPARIN (PORCINE) IN NACL 2-0.9 UNIT/ML-% IJ SOLN
INTRAMUSCULAR | Status: AC
Start: 2016-07-08 — End: 2016-07-08
  Filled 2016-07-08: qty 1500

## 2016-07-08 MED ORDER — HEPARIN BOLUS VIA INFUSION
3000.0000 [IU] | Freq: Once | INTRAVENOUS | Status: AC
  Administered 2016-07-08: 3000 [IU] via INTRAVENOUS
  Filled 2016-07-08: qty 3000

## 2016-07-08 MED ORDER — ASPIRIN 81 MG PO CHEW: 81.0000 mg | CHEWABLE_TABLET | ORAL | Status: DC

## 2016-07-08 MED ORDER — SODIUM CHLORIDE 0.9 % IV SOLN
INTRAVENOUS | Status: DC
  Administered 2016-07-08: 1000 mL via INTRAVENOUS

## 2016-07-08 MED ORDER — LEVOTHYROXINE SODIUM 25 MCG PO TABS
25.0000 ug | ORAL_TABLET | Freq: Every day | ORAL | Status: DC
  Administered 2016-07-08 – 2016-07-12 (×5): 25 ug via ORAL
  Filled 2016-07-08 (×5): qty 1

## 2016-07-08 MED ORDER — ACETAMINOPHEN 325 MG PO TABS
650.0000 mg | ORAL_TABLET | Freq: Four times a day (QID) | ORAL | Status: DC | PRN
  Administered 2016-07-09: 650 mg via ORAL
  Filled 2016-07-08: qty 2

## 2016-07-08 MED ORDER — MIDAZOLAM HCL 2 MG/2ML IJ SOLN
INTRAMUSCULAR | Status: DC | PRN
  Administered 2016-07-08: 1 mg via INTRAVENOUS

## 2016-07-08 MED ORDER — VITAMIN B-1 100 MG PO TABS
100.0000 mg | ORAL_TABLET | Freq: Every day | ORAL | Status: DC
  Administered 2016-07-08 – 2016-07-12 (×5): 100 mg via ORAL
  Filled 2016-07-08 (×6): qty 1

## 2016-07-08 MED ORDER — OXYCODONE HCL 5 MG PO TABS
5.0000 mg | ORAL_TABLET | Freq: Four times a day (QID) | ORAL | Status: DC | PRN
  Administered 2016-07-08 – 2016-07-10 (×4): 5 mg via ORAL
  Filled 2016-07-08 (×4): qty 1

## 2016-07-08 MED ORDER — LORAZEPAM 1 MG PO TABS: 1.0000 mg | ORAL_TABLET | Freq: Four times a day (QID) | ORAL | Status: AC | PRN

## 2016-07-08 MED ORDER — LIDOCAINE HCL (PF) 1 % IJ SOLN
INTRAMUSCULAR | Status: DC | PRN
  Administered 2016-07-08: 2 mL

## 2016-07-08 MED ORDER — SODIUM CHLORIDE 0.9 % IV SOLN: 250.0000 mL | INTRAVENOUS | Status: DC | PRN

## 2016-07-08 MED ORDER — FENTANYL CITRATE (PF) 100 MCG/2ML IJ SOLN
INTRAMUSCULAR | Status: DC | PRN
  Administered 2016-07-08: 25 ug via INTRAVENOUS

## 2016-07-08 MED ORDER — ADULT MULTIVITAMIN W/MINERALS CH
1.0000 | ORAL_TABLET | Freq: Every day | ORAL | Status: DC
  Administered 2016-07-08 – 2016-07-12 (×5): 1 via ORAL
  Filled 2016-07-08 (×6): qty 1

## 2016-07-08 MED ORDER — FENTANYL CITRATE (PF) 100 MCG/2ML IJ SOLN
INTRAMUSCULAR | Status: AC
  Filled 2016-07-08: qty 2

## 2016-07-08 MED ORDER — HEPARIN (PORCINE) IN NACL 100-0.45 UNIT/ML-% IJ SOLN
700.0000 [IU]/h | INTRAMUSCULAR | Status: DC
  Administered 2016-07-08: 700 [IU]/h via INTRAVENOUS
  Filled 2016-07-08: qty 250

## 2016-07-08 MED ORDER — MIDAZOLAM HCL 2 MG/2ML IJ SOLN
INTRAMUSCULAR | Status: AC
  Filled 2016-07-08: qty 2

## 2016-07-08 MED ORDER — VERAPAMIL HCL 2.5 MG/ML IV SOLN
INTRAVENOUS | Status: AC
  Filled 2016-07-08: qty 2

## 2016-07-08 MED ORDER — ATORVASTATIN CALCIUM 40 MG PO TABS
40.0000 mg | ORAL_TABLET | Freq: Every day | ORAL | Status: DC
  Administered 2016-07-08 – 2016-07-12 (×5): 40 mg via ORAL
  Filled 2016-07-08 (×6): qty 1

## 2016-07-08 MED ORDER — HEPARIN SODIUM (PORCINE) 1000 UNIT/ML IJ SOLN
INTRAMUSCULAR | Status: DC | PRN
  Administered 2016-07-08: 3000 [IU] via INTRAVENOUS

## 2016-07-08 MED FILL — Nitroglycerin IV Soln 100 MCG/ML in D5W: INTRA_ARTERIAL | Qty: 10 | Status: AC

## 2016-07-08 MED FILL — Verapamil HCl IV Soln 2.5 MG/ML: INTRAVENOUS | Qty: 2 | Status: AC

## 2016-07-08 SURGICAL SUPPLY — 10 items
CATH IMPULSE 5F ANG/FL3.5 (CATHETERS) ×2 IMPLANT
DEVICE RAD COMP TR BAND LRG (VASCULAR PRODUCTS) ×2 IMPLANT
GLIDESHEATH SLEND SS 6F .021 (SHEATH) ×2 IMPLANT
GUIDEWIRE INQWIRE 1.5J.035X260 (WIRE) ×1 IMPLANT
INQWIRE 1.5J .035X260CM (WIRE) ×2
KIT HEART LEFT (KITS) ×2 IMPLANT
PACK CARDIAC CATHETERIZATION (CUSTOM PROCEDURE TRAY) ×2 IMPLANT
SYR MEDRAD MARK V 150ML (SYRINGE) ×2 IMPLANT
TRANSDUCER W/STOPCOCK (MISCELLANEOUS) ×2 IMPLANT
TUBING CIL FLEX 10 FLL-RA (TUBING) ×2 IMPLANT

## 2016-07-08 NOTE — Progress Notes (Signed)
ANTICOAGULATION CONSULT NOTE   Pharmacy Consult for heparin  Indication: chest pain/ACS  No Known Allergies  Patient Measurements: Height: 5\' 3"  (160 cm) Weight: 121 lb (54.9 kg) IBW/kg (Calculated) : 52.4 Heparin Dosing Weight: 54.9 kg   Vital Signs: Temp: 98.4 F (36.9 C) (12/08 2014) Temp Source: Oral (12/08 2014) BP: 94/50 (12/08 2014) Pulse Rate: 73 (12/08 2014)  Labs:  Recent Labs  07/07/16 1914  07/08/16 0140 07/08/16 0645 07/08/16 1300  HGB 14.6  --  14.0  --   --   HCT 41.2  --  39.7  --   --   PLT 292  --  273  --   --   APTT 29  --   --   --   --   LABPROT 13.7  --   --   --   --   INR 1.04  --   --   --   --   CREATININE 0.68  --  0.60  --   --   TROPONINI <0.03  < > 0.17* 0.27* 0.20*  < > = values in this interval not displayed.  Estimated Creatinine Clearance: 58.8 mL/min (by C-G formula based on SCr of 0.6 mg/dL).   Medical History: Past Medical History:  Diagnosis Date  . Hyperlipidemia   . Thyroid disease    Assessment: Pam [name] is a 65 yo female admitted 12/8 with a fall. Patient does have ST elevations on EKG and Troponins are 0.27. Pharmacy has been consulted to restart heparin for ACS.  Cath shows multivessel CAD, plan is to do viability study for LAD revascularization. Heparin to restart after radial band removed. Note there was some bleeding earlier from TR band. Will follow closely.   Goal of Therapy:  Heparin level 0.3-0.7 units/ml Monitor platelets by anticoagulation protocol: Yes   Plan:  Start Heparin at 700 units/hr  6 hour heparin level  Daily Heparin level and CBC  Erin Hearing PharmD., BCPS Clinical Pharmacist Pager [phone] 07/08/2016 9:10 PM

## 2016-07-08 NOTE — Interval H&P Note (Signed)
History and Physical Interval Note:  07/08/2016 2:52 PM  Pam [name]  has presented today for cardiac catheterization, with the diagnosis of NSTEMI. The various methods of treatment have been discussed with the patient and family. After consideration of risks, benefits and other options for treatment, the patient has consented to  Procedure(s): Left Heart Cath and Coronary Angiography (N/A) as a surgical intervention .  The patient's history has been reviewed, patient examined, no change in status, stable for surgery.  I have reviewed the patient's chart and labs.  Questions were answered to the patient's satisfaction.    Cath Lab Visit (complete for each Cath Lab visit)  Clinical Evaluation Leading to the Procedure:   ACS: Yes.    Non-ACS: N/A  Akayla Brass

## 2016-07-08 NOTE — Progress Notes (Signed)
Patient Name: Pam [name] Date of Encounter: 07/08/2016  Principal Problem:   Fall Active Problems:   Abnormal EKG   HLD (hyperlipidemia)   Hypothyroidism   Length of Stay: 0  SUBJECTIVE  The patient feels sore after fall on her face, however denies any chest pain or shortness of breath.  CURRENT MEDS . aspirin  325 mg Oral Daily  . atorvastatin  40 mg Oral Daily  . folic acid  1 mg Oral Daily  . levothyroxine  25 mcg Oral QAC breakfast  . LORazepam  0-4 mg Oral Q6H   Followed by  . [START ON 07/10/2016] LORazepam  0-4 mg Oral Q12H  . multivitamin with minerals  1 tablet Oral Daily  . thiamine  100 mg Oral Daily   Or  . thiamine  100 mg Intravenous Daily   OBJECTIVE  Vitals:   07/08/16 0600 07/08/16 0630 07/08/16 0700 07/08/16 0820  BP: 126/62 123/81 129/85 127/64  Pulse: 77 86 60 78  Resp: 19 16 25 17   SpO2: 94% 92% 94% 90%  Weight:      Height:       No intake or output data in the 24 hours ending 07/08/16 0911 Filed Weights   07/07/16 2056  Weight: 121 lb (54.9 kg)    PHYSICAL EXAM  General: Alert and oriented X 3. Moves all extremities spontaneously. Facial laceration, swollen right eye HEENT:  Normal  Neck: Supple without bruits or JVD. Lungs:  Resp regular and unlabored, CTA. Heart: RRR no s3, s4, or murmurs. Abdomen: Soft, non-tender, non-distended, BS + x 4.  Extremities: No clubbing, cyanosis or edema. DP/PT/Radials 2+ and equal bilaterally.  Accessory Clinical Findings  CBC  Recent Labs  07/07/16 1914 07/08/16 0140  WBC 14.0* 12.5*  NEUTROABS 10.4*  --   HGB 14.6 14.0  HCT 41.2 39.7  MCV 87.3 85.9  PLT 292 [id number]   Basic Metabolic Panel  Recent Labs  07/07/16 1914 07/08/16 0140  NA 125* 131*  K 4.5 3.9  CL 94* 100*  CO2 21* 19*  GLUCOSE 91 87  BUN 7 5*  CREATININE 0.68 0.60  CALCIUM 8.9 8.8*   Liver Function Tests  Recent Labs  07/07/16 1914  AST 31  ALT 27  ALKPHOS 58  BILITOT 0.8  PROT 6.4*  ALBUMIN 3.8     Recent Labs  07/07/16 2313 07/08/16 0140 07/08/16 0645  TROPONINI 0.10* 0.17* 0.27*    Recent Labs  07/07/16 1914  CHOL 174  HDL 87  LDLCALC 70  TRIG 87  CHOLHDL 2.0   Thyroid Function Tests  Recent Labs  07/08/16 0617  TSH 2.454    Radiology/Studies  Ct Head Wo Contrast Ct Cervical Spine Wo Contrast Ct Maxillofacial Wo Contrast  Result Date: 07/07/2016 CLINICAL DATA:  Patient found down on the ground after alcohol consumption.  IMPRESSION: 1. No acute intracranial abnormality. Right frontal scalp hematoma without skull fracture. 2. Minimally depressed nasal bone fracture. No additional facial bone fracture. 3. Degenerative change throughout cervical spine. No acute fracture or subluxation. 4. Emphysema noted at the lung apices. 5. Carotid and intracranial atherosclerosis.   TELE: SR  ECG: SR, anterolateral ischemia    ASSESSMENT AND PLAN  65 year old female found unconscious at home by her husband, alcohol intoxicated time, CT head negative for any intracranial bleeding, there is only right frontal scalp hematoma without any fracture, her hemoglobin is normal.  1. STEMI - on presentation - resolved STE, now anterolateral ischemia with troponin elevation  0.10->0.17->0.27, not impressive for a STEMI, we will obtain echocardiogram to evaluate for WMA, ? Tako-tsubo, we will plan for a cardiac cath later today if negative , I will start heparin drip for now - continue aspirin and atorvastatin, her blood pressure is well controlled. - Start carvedilol 3.125 mg by mouth twice a day.  2. Hyperlipidemia - on atorvastatin  3. Alcohol abuse - placed on folic acid and Ativan, as a precaution for DTs, we'll also start vitamin B1  4. Smoking abuse - cessation discussed.  Signed, Ena Dawley MD, [facility] 07/08/2016

## 2016-07-08 NOTE — ED Notes (Signed)
Echo being performed at bedside

## 2016-07-08 NOTE — H&P (Signed)
History and Physical    Pam [name] [id number] DOB: Sep 03, 1950 DOA: 07/07/2016  PCP: Pcp Not In System  Patient coming from: Home.  Chief Complaint: Fall.  HPI: Pam [name] is a 65 y.o. female with history of hyperlipidemia hypothyroidism was brought to the ER after patient had a fall. Patient states she was planning to dispose the trash when she suddenly slipped and fell. She fell onto her face. Denies losing consciousness. Denies any chest pain shortness of breath palpitations. EMS was called and EKG was showing ST elevation in the anterior leads and on-call cardiologist was consulted. Initial troponin was negative and patient did not have chest pain. Cardiologist felt that patient at this time did not meet the criteria for ST elevation MI and advised observation.  CT head neck and maxillofacial was done and showed minimally displaced nasal bone fracture and frontal scalp hematoma. Patient had suturing done of the laceration on the face and the left hand. On call ENT surgeon Dr. Redmond Baseman was consulted by the ER physician and Dr. Redmond Baseman advised outpatient follow-up.  Patient's alcohol level were positive and patient states she was drinking tequila and beer prior to the fall.  Labs also show mild hyponatremia.  ED Course: On-call cardiologist was consulted for the ST elevation in the EKG. CT scan of the head Maxillofacial was done which showed minimally depressed nasal bone fracture. On-call ENT trauma was consulted. Suturing of the nasal and scalp and left hand laceration was done. Tetanus toxoid was administered.  Review of Systems: As per HPI, rest all negative.   Past Medical History:  Diagnosis Date  . Hyperlipidemia   . Thyroid disease     Past Surgical History:  Procedure Laterality Date  . ABDOMINAL HYSTERECTOMY    . CATARACT EXTRACTION, BILATERAL       reports that she has been smoking Cigarettes.  She has a 50.00 pack-year smoking history. She has never used smokeless  tobacco. She reports that she drinks alcohol. She reports that she does not use drugs.  No Known Allergies  Family History  Problem Relation Age of Onset  . Kidney failure Mother     Prior to Admission medications   Medication Sig Start Date End Date Taking? Authorizing Provider  atorvastatin (LIPITOR) 40 MG tablet Take 40 mg by mouth daily.   Yes Historical Provider, MD  levothyroxine (SYNTHROID, LEVOTHROID) 25 MCG tablet Take 25 mcg by mouth daily before breakfast.   Yes Historical Provider, MD    Physical Exam: Vitals:   07/07/16 2300 07/07/16 2330 07/08/16 0000 07/08/16 0030  BP: 124/76 132/83 134/80 145/74  Pulse: 90 87 88 90  Resp: 21 16 21 18   SpO2: 94% 94% 92% 92%  Weight:      Height:          Constitutional: Moderately built and nourished. Vitals:   07/07/16 2300 07/07/16 2330 07/08/16 0000 07/08/16 0030  BP: 124/76 132/83 134/80 145/74  Pulse: 90 87 88 90  Resp: 21 16 21 18   SpO2: 94% 94% 92% 92%  Weight:      Height:       Eyes: Anicteric no pallor. ENMT: Nasal laceration and hematoma. Frontal scalp hematoma. Neck: No neck rigidity no mass felt. Respiratory: No rhonchi or crepitations. Cardiovascular: S1 and S2 heard. No murmurs appreciated. Abdomen: Soft nontender bowel sounds present. Musculoskeletal: No edema. Left hand suturing done. Skin: Scalp hematoma. Nasal hematoma. Neurologic: Alert awake oriented to time place and person. Moves all extremities. Psychiatric: Appears normal.  Normal affect.   Labs on Admission: I have personally reviewed following labs and imaging studies  CBC:  Recent Labs Lab 07/07/16 1914  WBC 14.0*  NEUTROABS 10.4*  HGB 14.6  HCT 41.2  MCV 87.3  PLT 123456   Basic Metabolic Panel:  Recent Labs Lab 07/07/16 1914  NA 125*  K 4.5  CL 94*  CO2 21*  GLUCOSE 91  BUN 7  CREATININE 0.68  CALCIUM 8.9   GFR: Estimated Creatinine Clearance: 58.8 mL/min (by C-G formula based on SCr of 0.68 mg/dL). Liver Function  Tests:  Recent Labs Lab 07/07/16 1914  AST 31  ALT 27  ALKPHOS 58  BILITOT 0.8  PROT 6.4*  ALBUMIN 3.8   No results for input(s): LIPASE, AMYLASE in the last 168 hours. No results for input(s): AMMONIA in the last 168 hours. Coagulation Profile:  Recent Labs Lab 07/07/16 1914  INR 1.04   Cardiac Enzymes:  Recent Labs Lab 07/07/16 1914 07/07/16 2313  TROPONINI <0.03 0.10*   BNP (last 3 results) No results for input(s): PROBNP in the last 8760 hours. HbA1C: No results for input(s): HGBA1C in the last 72 hours. CBG: No results for input(s): GLUCAP in the last 168 hours. Lipid Profile:  Recent Labs  07/07/16 1914  CHOL 174  HDL 87  LDLCALC 70  TRIG 87  CHOLHDL 2.0   Thyroid Function Tests: No results for input(s): TSH, T4TOTAL, FREET4, T3FREE, THYROIDAB in the last 72 hours. Anemia Panel: No results for input(s): VITAMINB12, FOLATE, FERRITIN, TIBC, IRON, RETICCTPCT in the last 72 hours. Urine analysis: No results found for: COLORURINE, APPEARANCEUR, LABSPEC, PHURINE, GLUCOSEU, HGBUR, BILIRUBINUR, KETONESUR, PROTEINUR, UROBILINOGEN, NITRITE, LEUKOCYTESUR Sepsis Labs: @LABRCNTIP (procalcitonin:4,lacticidven:4) )No results found for this or any previous visit (from the past 240 hour(s)).   Radiological Exams on Admission: Ct Head Wo Contrast  Result Date: 07/07/2016 CLINICAL DATA:  Patient found down on the ground after alcohol consumption. EXAM: CT HEAD WITHOUT CONTRAST CT MAXILLOFACIAL WITHOUT CONTRAST CT CERVICAL SPINE WITHOUT CONTRAST TECHNIQUE: Multidetector CT imaging of the head, cervical spine, and maxillofacial structures were performed using the standard protocol without intravenous contrast. Multiplanar CT image reconstructions of the cervical spine and maxillofacial structures were also generated. COMPARISON:  None. FINDINGS: CT HEAD FINDINGS Brain: No evidence of acute infarction, hemorrhage, hydrocephalus, extra-axial collection or mass lesion/mass  effect. Mild generalized atrophy. Vascular: Atherosclerosis of skullbase vasculature without hyperdense vessel or abnormal calcification. Skull: Right frontal scalp hematoma without calvarial fracture. Other: None. CT MAXILLOFACIAL FINDINGS Osseous: Nasal bone fracture, mildly displaced. There is mild rightward nasal septal swelling with possible fracture posteriorly, no nasal septal hematoma. No additional facial bone fracture. Zygomatic arches are intact. No mandibular fracture, temporomandibular joints are congruent. Orbits: Right supraorbital soft tissue edema. No orbital fracture. Both globes are intact. Sinuses: Clear. Soft tissues: Soft tissue edema about the chin. CT CERVICAL SPINE FINDINGS Alignment: Straightening of normal lordosis. No listhesis, jumped or perched facets. Skull base and vertebrae: No acute fracture. No primary bone lesion or focal pathologic process. Soft tissues and spinal canal: No prevertebral fluid or swelling. No visible canal hematoma. Disc levels: Multilevel disc space narrowing and endplate spurring is most prominent at C5-C6 and C6-C7. There is multilevel facet arthropathy. Upper chest: Emphysema, advanced. Azygos fissure is incidentally noted. Other: Carotid atherosclerosis. IMPRESSION: 1. No acute intracranial abnormality. Right frontal scalp hematoma without skull fracture. 2. Minimally depressed nasal bone fracture. No additional facial bone fracture. 3. Degenerative change throughout cervical spine. No acute fracture or subluxation.  4. Emphysema noted at the lung apices. 5. Carotid and intracranial atherosclerosis. Electronically Signed   By: Jeb Levering M.D.   On: 07/07/2016 20:38   Ct Cervical Spine Wo Contrast  Result Date: 07/07/2016 CLINICAL DATA:  Patient found down on the ground after alcohol consumption. EXAM: CT HEAD WITHOUT CONTRAST CT MAXILLOFACIAL WITHOUT CONTRAST CT CERVICAL SPINE WITHOUT CONTRAST TECHNIQUE: Multidetector CT imaging of the head, cervical  spine, and maxillofacial structures were performed using the standard protocol without intravenous contrast. Multiplanar CT image reconstructions of the cervical spine and maxillofacial structures were also generated. COMPARISON:  None. FINDINGS: CT HEAD FINDINGS Brain: No evidence of acute infarction, hemorrhage, hydrocephalus, extra-axial collection or mass lesion/mass effect. Mild generalized atrophy. Vascular: Atherosclerosis of skullbase vasculature without hyperdense vessel or abnormal calcification. Skull: Right frontal scalp hematoma without calvarial fracture. Other: None. CT MAXILLOFACIAL FINDINGS Osseous: Nasal bone fracture, mildly displaced. There is mild rightward nasal septal swelling with possible fracture posteriorly, no nasal septal hematoma. No additional facial bone fracture. Zygomatic arches are intact. No mandibular fracture, temporomandibular joints are congruent. Orbits: Right supraorbital soft tissue edema. No orbital fracture. Both globes are intact. Sinuses: Clear. Soft tissues: Soft tissue edema about the chin. CT CERVICAL SPINE FINDINGS Alignment: Straightening of normal lordosis. No listhesis, jumped or perched facets. Skull base and vertebrae: No acute fracture. No primary bone lesion or focal pathologic process. Soft tissues and spinal canal: No prevertebral fluid or swelling. No visible canal hematoma. Disc levels: Multilevel disc space narrowing and endplate spurring is most prominent at C5-C6 and C6-C7. There is multilevel facet arthropathy. Upper chest: Emphysema, advanced. Azygos fissure is incidentally noted. Other: Carotid atherosclerosis. IMPRESSION: 1. No acute intracranial abnormality. Right frontal scalp hematoma without skull fracture. 2. Minimally depressed nasal bone fracture. No additional facial bone fracture. 3. Degenerative change throughout cervical spine. No acute fracture or subluxation. 4. Emphysema noted at the lung apices. 5. Carotid and intracranial  atherosclerosis. Electronically Signed   By: Jeb Levering M.D.   On: 07/07/2016 20:38   Ct Maxillofacial Wo Contrast  Result Date: 07/07/2016 CLINICAL DATA:  Patient found down on the ground after alcohol consumption. EXAM: CT HEAD WITHOUT CONTRAST CT MAXILLOFACIAL WITHOUT CONTRAST CT CERVICAL SPINE WITHOUT CONTRAST TECHNIQUE: Multidetector CT imaging of the head, cervical spine, and maxillofacial structures were performed using the standard protocol without intravenous contrast. Multiplanar CT image reconstructions of the cervical spine and maxillofacial structures were also generated. COMPARISON:  None. FINDINGS: CT HEAD FINDINGS Brain: No evidence of acute infarction, hemorrhage, hydrocephalus, extra-axial collection or mass lesion/mass effect. Mild generalized atrophy. Vascular: Atherosclerosis of skullbase vasculature without hyperdense vessel or abnormal calcification. Skull: Right frontal scalp hematoma without calvarial fracture. Other: None. CT MAXILLOFACIAL FINDINGS Osseous: Nasal bone fracture, mildly displaced. There is mild rightward nasal septal swelling with possible fracture posteriorly, no nasal septal hematoma. No additional facial bone fracture. Zygomatic arches are intact. No mandibular fracture, temporomandibular joints are congruent. Orbits: Right supraorbital soft tissue edema. No orbital fracture. Both globes are intact. Sinuses: Clear. Soft tissues: Soft tissue edema about the chin. CT CERVICAL SPINE FINDINGS Alignment: Straightening of normal lordosis. No listhesis, jumped or perched facets. Skull base and vertebrae: No acute fracture. No primary bone lesion or focal pathologic process. Soft tissues and spinal canal: No prevertebral fluid or swelling. No visible canal hematoma. Disc levels: Multilevel disc space narrowing and endplate spurring is most prominent at C5-C6 and C6-C7. There is multilevel facet arthropathy. Upper chest: Emphysema, advanced. Azygos fissure is incidentally  noted.  Other: Carotid atherosclerosis. IMPRESSION: 1. No acute intracranial abnormality. Right frontal scalp hematoma without skull fracture. 2. Minimally depressed nasal bone fracture. No additional facial bone fracture. 3. Degenerative change throughout cervical spine. No acute fracture or subluxation. 4. Emphysema noted at the lung apices. 5. Carotid and intracranial atherosclerosis. Electronically Signed   By: Jeb Levering M.D.   On: 07/07/2016 20:38    EKG: Independently reviewed. Normal sinus rhythm with anterior 8 showing ST elevation.  Assessment/Plan Principal Problem:   Fall Active Problems:   Abnormal EKG   HLD (hyperlipidemia)   Hypothyroidism    1. Fall with scalp hematoma nasal bone fracture and left hand laceration - patient had suturing of the scalp, nasal and left hand laceration. On-call ENT surgeon, Dr. Redmond Baseman was contacted by the ER physician. Dr. Redmond Baseman advised outpatient follow-up. 2. Abnormal EKG with ST elevation in anterior leads  - cardiology advised admission and serial  cycling of troponin. Will check 2-D echo. Not on aspirin due to bleeding from the nose and scalp. I have requested formal cardiology consult. 3. Positive alcohol levels - patient had Tequila and beer prior to the fall. For now we will keep patient on CIWA protocol. 4. Hyponatremia - probably from alcoholism. Check urine sodium osmolality and closely follow metabolic panel. Check TSH. 5.  Hypothyroidism on Synthroid. 6. Hyperlipidemia on statins.   DVT prophylaxis: SCDs. Code Status: Full code.  Family Communication: Patient's husband.  Disposition Plan: Home.  Consults called: Cardiology.  Admission status: Observation.    Rise Patience MD [facility] Pager [phone].  If 7PM-7AM, please contact night-coverage [email or web address] Password [email or web address]  07/08/2016, 1:16 AM

## 2016-07-08 NOTE — ED Notes (Signed)
Pt given po fluids and crackers  Pillow and repositioned in bed for comfort  Updated on bed statues PT NAD

## 2016-07-08 NOTE — Progress Notes (Signed)
Pt with small bleeding to Rt radial band.  Notified cath lab at [facility] from cath lab came to assess pt's Rt. Radial.   Noted good capillary refill to fingers and radial w/no swelling or tenderness noted.  Bleeding subsided.  Continue to monitor.  Karie Kirks, RN

## 2016-07-08 NOTE — ED Notes (Signed)
Took pt off nasal cannula, pt stated it was irritating her nose. Pt O2 reads at 95%.

## 2016-07-08 NOTE — Progress Notes (Signed)
  Echocardiogram 2D Echocardiogram with Definity has been performed.  Tresa Res 07/08/2016, 12:18 PM

## 2016-07-08 NOTE — ED Notes (Signed)
Assisted pt with bedpan, pt reported nausea

## 2016-07-08 NOTE — H&P (View-Only) (Signed)
Patient Name: Pam [name] Date of Encounter: 07/08/2016  Principal Problem:   Fall Active Problems:   Abnormal EKG   HLD (hyperlipidemia)   Hypothyroidism   Length of Stay: 0  SUBJECTIVE  The patient feels sore after fall on her face, however denies any chest pain or shortness of breath.  CURRENT MEDS . aspirin  325 mg Oral Daily  . atorvastatin  40 mg Oral Daily  . folic acid  1 mg Oral Daily  . levothyroxine  25 mcg Oral QAC breakfast  . LORazepam  0-4 mg Oral Q6H   Followed by  . [START ON 07/10/2016] LORazepam  0-4 mg Oral Q12H  . multivitamin with minerals  1 tablet Oral Daily  . thiamine  100 mg Oral Daily   Or  . thiamine  100 mg Intravenous Daily   OBJECTIVE  Vitals:   07/08/16 0600 07/08/16 0630 07/08/16 0700 07/08/16 0820  BP: 126/62 123/81 129/85 127/64  Pulse: 77 86 60 78  Resp: 19 16 25 17   SpO2: 94% 92% 94% 90%  Weight:      Height:       No intake or output data in the 24 hours ending 07/08/16 0911 Filed Weights   07/07/16 2056  Weight: 121 lb (54.9 kg)    PHYSICAL EXAM  General: Alert and oriented X 3. Moves all extremities spontaneously. Facial laceration, swollen right eye HEENT:  Normal  Neck: Supple without bruits or JVD. Lungs:  Resp regular and unlabored, CTA. Heart: RRR no s3, s4, or murmurs. Abdomen: Soft, non-tender, non-distended, BS + x 4.  Extremities: No clubbing, cyanosis or edema. DP/PT/Radials 2+ and equal bilaterally.  Accessory Clinical Findings  CBC  Recent Labs  07/07/16 1914 07/08/16 0140  WBC 14.0* 12.5*  NEUTROABS 10.4*  --   HGB 14.6 14.0  HCT 41.2 39.7  MCV 87.3 85.9  PLT 292 123456   Basic Metabolic Panel  Recent Labs  07/07/16 1914 07/08/16 0140  NA 125* 131*  K 4.5 3.9  CL 94* 100*  CO2 21* 19*  GLUCOSE 91 87  BUN 7 5*  CREATININE 0.68 0.60  CALCIUM 8.9 8.8*   Liver Function Tests  Recent Labs  07/07/16 1914  AST 31  ALT 27  ALKPHOS 58  BILITOT 0.8  PROT 6.4*  ALBUMIN 3.8     Recent Labs  07/07/16 2313 07/08/16 0140 07/08/16 0645  TROPONINI 0.10* 0.17* 0.27*    Recent Labs  07/07/16 1914  CHOL 174  HDL 87  LDLCALC 70  TRIG 87  CHOLHDL 2.0   Thyroid Function Tests  Recent Labs  07/08/16 0617  TSH 2.454    Radiology/Studies  Ct Head Wo Contrast Ct Cervical Spine Wo Contrast Ct Maxillofacial Wo Contrast  Result Date: 07/07/2016 CLINICAL DATA:  Patient found down on the ground after alcohol consumption.  IMPRESSION: 1. No acute intracranial abnormality. Right frontal scalp hematoma without skull fracture. 2. Minimally depressed nasal bone fracture. No additional facial bone fracture. 3. Degenerative change throughout cervical spine. No acute fracture or subluxation. 4. Emphysema noted at the lung apices. 5. Carotid and intracranial atherosclerosis.   TELE: SR  ECG: SR, anterolateral ischemia    ASSESSMENT AND PLAN  65 year old female found unconscious at home by her husband, alcohol intoxicated time, CT head negative for any intracranial bleeding, there is only right frontal scalp hematoma without any fracture, her hemoglobin is normal.  1. STEMI - on presentation - resolved STE, now anterolateral ischemia with troponin elevation  0.10->0.17->0.27, not impressive for a STEMI, we will obtain echocardiogram to evaluate for WMA, ? Tako-[id number], we will plan for a cardiac cath later today if negative , I will start heparin drip for now - continue aspirin and atorvastatin, her blood pressure is well controlled. - Start carvedilol 3.125 mg by mouth twice a day.  2. Hyperlipidemia - on atorvastatin  3. Alcohol abuse - placed on folic acid and Ativan, as a precaution for DTs, we'll also start vitamin B1  4. Smoking abuse - cessation discussed.  Signed, Ena Dawley MD, [facility] 07/08/2016

## 2016-07-08 NOTE — Progress Notes (Signed)
ANTICOAGULATION CONSULT NOTE - Initial Consult  Pharmacy Consult for heparin  Indication: chest pain/ACS  No Known Allergies  Patient Measurements: Height: 5\' 3"  (160 cm) Weight: 121 lb (54.9 kg) IBW/kg (Calculated) : 52.4 Heparin Dosing Weight: 54.9 kg   Vital Signs: BP: 124/56 (12/08 1100) Pulse Rate: 64 (12/08 1100)  Labs:  Recent Labs  07/07/16 1914 07/07/16 2313 07/08/16 0140 07/08/16 0645  HGB 14.6  --  14.0  --   HCT 41.2  --  39.7  --   PLT 292  --  273  --   APTT 29  --   --   --   LABPROT 13.7  --   --   --   INR 1.04  --   --   --   CREATININE 0.68  --  0.60  --   TROPONINI <0.03 0.10* 0.17* 0.27*    Estimated Creatinine Clearance: 58.8 mL/min (by C-G formula based on SCr of 0.6 mg/dL).   Medical History: Past Medical History:  Diagnosis Date  . Hyperlipidemia   . Thyroid disease       Assessment: FM is a 65 yo female admitted 12/8 with a fall. Patient does have ST elevations on EKG and Troponins are 0.27. Pharmacy has been consulted to start heparin for ACS. Baseline CBC within normal limits. Patient had a scalp hematoma but no signs of an intracranial bleed. Plan for cardiac cath later today.   Goal of Therapy:  Heparin level 0.3-0.7 units/ml Monitor platelets by anticoagulation protocol: Yes   Plan:  Heparin 3000 unit bolus  Then Heparin at 700 units/hr  6 hour heparin level  Daily Heparin level and CBC  Ihor Austin, PharmD PGY1 Pharmacy Resident Pager: [phone] 07/08/2016,11:15 AM

## 2016-07-08 NOTE — Progress Notes (Signed)
PT Cancellation Note  Patient Details Name: Pam [name] MRN: [id number] DOB: [birth year]   Cancelled Treatment:    Reason Eval/Treat Not Completed: Patient at procedure or test/unavailable; patient in cath lab.  Will attempt to see another day.    Pam [name] 07/08/2016, 4:06 PM  Pam [name], [place] 07/08/2016

## 2016-07-08 NOTE — ED Notes (Signed)
Placed pt on 3L nasal cannula pt's O2 read at 90%.

## 2016-07-08 NOTE — Brief Op Note (Signed)
Brief Cardiac Catheterization Note (Full Report to Follow)  Date: 07/08/2016 Time: 4:23 PM  PATIENT:  Pam [name]  65 y.o. female  PRE-OPERATIVE DIAGNOSIS:  NSTEMI  POST-OPERATIVE DIAGNOSIS:  Multivessel coronary artery disease  PROCEDURE:  Procedure(s): Left Heart Cath and Coronary Angiography (N/A)  SURGEON:  Surgeon(s) and Role:    * [name], MD - Primary  FINDINGS: 1. Occluded prox/mid LAD (likely chronic) with left to right and right to right filling of the mid/distal LAD. 2. 80% stenosis if LCx/OM1 with TIMI-3 flow. 3. Severely reduced LV contraction with dyskinetic anterior wall and apex. 4. Low normal left ventricular filling pressure.  RECOMMENDATIONS: 1. Restart heparin infusion 2 hours after TR band deflation. 2. Recommend viability study to evaluate utility of LAD revascularization.  If anterior wall is viable, would recommend consideration of 2-vessel CABG versus PCI to LAD CTO and LCx/OM stenosis.  If LAD territory is not viable, would recommend PCI to OM only. 3. Aggressive secondary prevention. 4. Consider EP consultation to evaluate for arrhythmogenic cause of syncope, given evidence of prior MI.  [name], MD [facility] Pager: [phone]

## 2016-07-08 NOTE — Progress Notes (Addendum)
PROGRESS NOTE                                                                                                                                                                                                             Patient Demographics:    Pam [name], is a 65 y.o. female, DOB - 08-Nov-1950, [id number]  Admit date - 07/07/2016   Admitting Physician Burnell Blanks, MD  Outpatient Primary MD for the patient is Pcp Not In System  LOS - 0  Chief Complaint  Patient presents with  . Fall  . Facial Injury       Brief Narrative    Pam [name] is a 65 y.o. female with history of hyperlipidemia hypothyroidism was brought to the ER after patient had a fall. Patient states she was planning to dispose the trash when she suddenly slipped and fell. She fell onto her face. Denies losing consciousness. Denies any chest pain shortness of breath palpitations. EMS was called and EKG was showing ST elevation in the anterior leads and on-call cardiologist was consulted. Initial troponin was negative and patient did not have chest pain. Cardiologist felt that patient at this time did not meet the criteria for ST elevation MI and advised observation.  CT head neck and maxillofacial was done and showed minimally displaced nasal bone fracture and frontal scalp hematoma. Patient had suturing done of the laceration on the face and the left hand. On call ENT surgeon Dr. Redmond Baseman was consulted by the ER physician and Dr. Redmond Baseman advised outpatient follow-up.  Patient's alcohol level were positive and patient states she was drinking tequila and beer prior to the fall.  Labs also show mild hyponatremia.   Subjective:    Pam [name] today has, No headache, No chest pain, No abdominal pain - No Nausea, No new weakness tingling or numbness, No Cough - SOB.     Assessment  & Plan :     1. Mechaniical fall with right-sided scalp hematoma,  depressed nasal bone fracture, left hand laceration. She was treated in the ER lacerations were sutured, cleared from trauma standpoint by ER, ER physician called Dr. Redmond Baseman ENT who requested outpatient follow-up. She does have substantial facial bruising. Will monitor for 24 hours, PT eval.  2. Incidental finding of nonspecific EKG changes ?? STEMI ( defer to Cards) . Mildly  elevated troponin. This is not an ACS pattern, no chest pain, she is currently on aspirin along with statin, will add low-dose beta blocker, echo to evaluate EF and wall motion. Cards following.  3. HX of smoking and occasional alcohol intake. Patient states that she drinks 1-2 times a week, counseled to quit both. Monitor on CIWA protocol.  4. Hypothyroidism. On and thyroid continue.  5. Dyslipidemia. On statin.  6. 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   Family Communication  :  none  Code Status :  Full  Diet : Diet Heart Room service appropriate? Yes; Fluid consistency: Thin Diet NPO time specified   Disposition Plan  :  TBD  Consults  :  ENT by EDP, Cards  Procedures  :    CT head and neck -  depressed nasal fracture  DVT Prophylaxis  :  SCDs  Lab Results  Component Value Date   PLT 273 07/08/2016    Inpatient Medications  Scheduled Meds: . aspirin  325 mg Oral Daily  . atorvastatin  40 mg Oral Daily  . folic acid  1 mg Oral Daily  . levothyroxine  25 mcg Oral QAC breakfast  . LORazepam  0-4 mg Oral Q6H   Followed by  . [START ON 07/10/2016] LORazepam  0-4 mg Oral Q12H  . multivitamin with minerals  1 tablet Oral Daily  . thiamine  100 mg Oral Daily   Or  . thiamine  100 mg Intravenous Daily   Continuous Infusions: PRN Meds:.acetaminophen **OR** acetaminophen, LORazepam **OR** LORazepam, ondansetron **OR** ondansetron (ZOFRAN) IV, oxyCODONE  Antibiotics  :    Anti-infectives    None          Objective:   Vitals:   07/08/16 1030 07/08/16 1045 07/08/16 1100 07/08/16 1115  BP: 125/58 125/57 124/56 126/70  Pulse: 66 66 64 73  Resp: 16 19 17 18   SpO2: 92%  93% 93%  Weight:      Height:        Wt Readings from Last 3 Encounters:  07/07/16 54.9 kg (121 lb)  04/13/14 54.4 kg (120 lb)    No intake or output data in the 24 hours ending 07/08/16 1123   Physical Exam  Awake Alert, Oriented X 3, No new F.N deficits, Normal affect Nasal fracture, right-sided scalp laceration and a bandage, right periorbital hematoma,PERRAL Supple Neck,No JVD, No cervical lymphadenopathy appriciated.  Symmetrical Chest wall movement, Good air movement bilaterally, CTAB RRR,No Gallops,Rubs or new Murmurs, No Parasternal Heave +ve B.Sounds, Abd Soft, No tenderness, No organomegaly appriciated, No rebound - guarding or rigidity. No Cyanosis, Clubbing or edema, No new Rash or bruise     Data Review:    CBC  Recent Labs Lab 07/07/16 1914 07/08/16 0140  WBC 14.0* 12.5*  HGB 14.6 14.0  HCT 41.2 39.7  PLT 292 273  MCV 87.3 85.9  MCH 30.9 30.3  MCHC 35.4 35.3  RDW 14.6 14.6  LYMPHSABS 2.8  --   MONOABS 0.7  --   EOSABS 0.1  --   BASOSABS 0.1  --     Chemistries   Recent Labs Lab 07/07/16 1914 07/08/16 0140  NA 125* 131*  K 4.5 3.9  CL 94* 100*  CO2 21* 19*  GLUCOSE 91 87  BUN 7 5*  CREATININE 0.68 0.60  CALCIUM 8.9 8.8*  AST 31  --   ALT 27  --   ALKPHOS 58  --  BILITOT 0.8  --    ------------------------------------------------------------------------------------------------------------------  Recent Labs  07/07/16 1914  CHOL 174  HDL 87  LDLCALC 70  TRIG 87  CHOLHDL 2.0    No results found for: HGBA1C ------------------------------------------------------------------------------------------------------------------  Recent Labs  07/08/16 0617  TSH 2.454    ------------------------------------------------------------------------------------------------------------------ No results for input(s): VITAMINB12, FOLATE, FERRITIN, TIBC, IRON, RETICCTPCT in the last 72 hours.  Coagulation profile  Recent Labs Lab 07/07/16 1914  INR 1.04    No results for input(s): DDIMER in the last 72 hours.  Cardiac Enzymes  Recent Labs Lab 07/07/16 2313 07/08/16 0140 07/08/16 0645  TROPONINI 0.10* 0.17* 0.27*   ------------------------------------------------------------------------------------------------------------------ No results found for: BNP  Micro Results No results found for this or any previous visit (from the past 240 hour(s)).  Radiology Reports Ct Head Wo Contrast  Result Date: 07/07/2016 CLINICAL DATA:  Patient found down on the ground after alcohol consumption. EXAM: CT HEAD WITHOUT CONTRAST CT MAXILLOFACIAL WITHOUT CONTRAST CT CERVICAL SPINE WITHOUT CONTRAST TECHNIQUE: Multidetector CT imaging of the head, cervical spine, and maxillofacial structures were performed using the standard protocol without intravenous contrast. Multiplanar CT image reconstructions of the cervical spine and maxillofacial structures were also generated. COMPARISON:  None. FINDINGS: CT HEAD FINDINGS Brain: No evidence of acute infarction, hemorrhage, hydrocephalus, extra-axial collection or mass lesion/mass effect. Mild generalized atrophy. Vascular: Atherosclerosis of skullbase vasculature without hyperdense vessel or abnormal calcification. Skull: Right frontal scalp hematoma without calvarial fracture. Other: None. CT MAXILLOFACIAL FINDINGS Osseous: Nasal bone fracture, mildly displaced. There is mild rightward nasal septal swelling with possible fracture posteriorly, no nasal septal hematoma. No additional facial bone fracture. Zygomatic arches are intact. No mandibular fracture, temporomandibular joints are congruent. Orbits: Right supraorbital soft tissue  edema. No orbital fracture. Both globes are intact. Sinuses: Clear. Soft tissues: Soft tissue edema about the chin. CT CERVICAL SPINE FINDINGS Alignment: Straightening of normal lordosis. No listhesis, jumped or perched facets. Skull base and vertebrae: No acute fracture. No primary bone lesion or focal pathologic process. Soft tissues and spinal canal: No prevertebral fluid or swelling. No visible canal hematoma. Disc levels: Multilevel disc space narrowing and endplate spurring is most prominent at C5-C6 and C6-C7. There is multilevel facet arthropathy. Upper chest: Emphysema, advanced. Azygos fissure is incidentally noted. Other: Carotid atherosclerosis. IMPRESSION: 1. No acute intracranial abnormality. Right frontal scalp hematoma without skull fracture. 2. Minimally depressed nasal bone fracture. No additional facial bone fracture. 3. Degenerative change throughout cervical spine. No acute fracture or subluxation. 4. Emphysema noted at the lung apices. 5. Carotid and intracranial atherosclerosis. Electronically Signed   By: Jeb Levering M.D.   On: 07/07/2016 20:38   Ct Cervical Spine Wo Contrast  Result Date: 07/07/2016 CLINICAL DATA:  Patient found down on the ground after alcohol consumption. EXAM: CT HEAD WITHOUT CONTRAST CT MAXILLOFACIAL WITHOUT CONTRAST CT CERVICAL SPINE WITHOUT CONTRAST TECHNIQUE: Multidetector CT imaging of the head, cervical spine, and maxillofacial structures were performed using the standard protocol without intravenous contrast. Multiplanar CT image reconstructions of the cervical spine and maxillofacial structures were also generated. COMPARISON:  None. FINDINGS: CT HEAD FINDINGS Brain: No evidence of acute infarction, hemorrhage, hydrocephalus, extra-axial collection or mass lesion/mass effect. Mild generalized atrophy. Vascular: Atherosclerosis of skullbase vasculature without hyperdense vessel or abnormal calcification. Skull: Right frontal scalp hematoma without  calvarial fracture. Other: None. CT MAXILLOFACIAL FINDINGS Osseous: Nasal bone fracture, mildly displaced. There is mild rightward nasal septal swelling with possible fracture posteriorly, no nasal septal hematoma. No additional facial bone fracture.  Zygomatic arches are intact. No mandibular fracture, temporomandibular joints are congruent. Orbits: Right supraorbital soft tissue edema. No orbital fracture. Both globes are intact. Sinuses: Clear. Soft tissues: Soft tissue edema about the chin. CT CERVICAL SPINE FINDINGS Alignment: Straightening of normal lordosis. No listhesis, jumped or perched facets. Skull base and vertebrae: No acute fracture. No primary bone lesion or focal pathologic process. Soft tissues and spinal canal: No prevertebral fluid or swelling. No visible canal hematoma. Disc levels: Multilevel disc space narrowing and endplate spurring is most prominent at C5-C6 and C6-C7. There is multilevel facet arthropathy. Upper chest: Emphysema, advanced. Azygos fissure is incidentally noted. Other: Carotid atherosclerosis. IMPRESSION: 1. No acute intracranial abnormality. Right frontal scalp hematoma without skull fracture. 2. Minimally depressed nasal bone fracture. No additional facial bone fracture. 3. Degenerative change throughout cervical spine. No acute fracture or subluxation. 4. Emphysema noted at the lung apices. 5. Carotid and intracranial atherosclerosis. Electronically Signed   By: Jeb Levering M.D.   On: 07/07/2016 20:38   Ct Maxillofacial Wo Contrast  Result Date: 07/07/2016 CLINICAL DATA:  Patient found down on the ground after alcohol consumption. EXAM: CT HEAD WITHOUT CONTRAST CT MAXILLOFACIAL WITHOUT CONTRAST CT CERVICAL SPINE WITHOUT CONTRAST TECHNIQUE: Multidetector CT imaging of the head, cervical spine, and maxillofacial structures were performed using the standard protocol without intravenous contrast. Multiplanar CT image reconstructions of the cervical spine and  maxillofacial structures were also generated. COMPARISON:  None. FINDINGS: CT HEAD FINDINGS Brain: No evidence of acute infarction, hemorrhage, hydrocephalus, extra-axial collection or mass lesion/mass effect. Mild generalized atrophy. Vascular: Atherosclerosis of skullbase vasculature without hyperdense vessel or abnormal calcification. Skull: Right frontal scalp hematoma without calvarial fracture. Other: None. CT MAXILLOFACIAL FINDINGS Osseous: Nasal bone fracture, mildly displaced. There is mild rightward nasal septal swelling with possible fracture posteriorly, no nasal septal hematoma. No additional facial bone fracture. Zygomatic arches are intact. No mandibular fracture, temporomandibular joints are congruent. Orbits: Right supraorbital soft tissue edema. No orbital fracture. Both globes are intact. Sinuses: Clear. Soft tissues: Soft tissue edema about the chin. CT CERVICAL SPINE FINDINGS Alignment: Straightening of normal lordosis. No listhesis, jumped or perched facets. Skull base and vertebrae: No acute fracture. No primary bone lesion or focal pathologic process. Soft tissues and spinal canal: No prevertebral fluid or swelling. No visible canal hematoma. Disc levels: Multilevel disc space narrowing and endplate spurring is most prominent at C5-C6 and C6-C7. There is multilevel facet arthropathy. Upper chest: Emphysema, advanced. Azygos fissure is incidentally noted. Other: Carotid atherosclerosis. IMPRESSION: 1. No acute intracranial abnormality. Right frontal scalp hematoma without skull fracture. 2. Minimally depressed nasal bone fracture. No additional facial bone fracture. 3. Degenerative change throughout cervical spine. No acute fracture or subluxation. 4. Emphysema noted at the lung apices. 5. Carotid and intracranial atherosclerosis. Electronically Signed   By: Jeb Levering M.D.   On: 07/07/2016 20:38    Time Spent in minutes  30   Delynda Sepulveda K M.D on 07/08/2016 at 11:23  AM  Between 7am to 7pm - Pager - [phone]  After 7pm go to [email or web address] - password [facility]  [facility] -  Office  [phone]

## 2016-07-09 DIAGNOSIS — Z841 Family history of disorders of kidney and ureter: Secondary | ICD-10-CM | POA: Diagnosis not present

## 2016-07-09 DIAGNOSIS — I2582 Chronic total occlusion of coronary artery: Secondary | ICD-10-CM | POA: Diagnosis present

## 2016-07-09 DIAGNOSIS — I25118 Atherosclerotic heart disease of native coronary artery with other forms of angina pectoris: Secondary | ICD-10-CM

## 2016-07-09 DIAGNOSIS — I21A1 Myocardial infarction type 2: Secondary | ICD-10-CM | POA: Diagnosis present

## 2016-07-09 DIAGNOSIS — E785 Hyperlipidemia, unspecified: Secondary | ICD-10-CM | POA: Diagnosis present

## 2016-07-09 DIAGNOSIS — I2584 Coronary atherosclerosis due to calcified coronary lesion: Secondary | ICD-10-CM | POA: Diagnosis present

## 2016-07-09 DIAGNOSIS — F17210 Nicotine dependence, cigarettes, uncomplicated: Secondary | ICD-10-CM | POA: Diagnosis present

## 2016-07-09 DIAGNOSIS — S0181XA Laceration without foreign body of other part of head, initial encounter: Secondary | ICD-10-CM | POA: Diagnosis not present

## 2016-07-09 DIAGNOSIS — I252 Old myocardial infarction: Secondary | ICD-10-CM | POA: Diagnosis not present

## 2016-07-09 DIAGNOSIS — E7800 Pure hypercholesterolemia, unspecified: Secondary | ICD-10-CM

## 2016-07-09 DIAGNOSIS — I2510 Atherosclerotic heart disease of native coronary artery without angina pectoris: Secondary | ICD-10-CM | POA: Diagnosis not present

## 2016-07-09 DIAGNOSIS — I255 Ischemic cardiomyopathy: Secondary | ICD-10-CM | POA: Diagnosis not present

## 2016-07-09 DIAGNOSIS — S61412A Laceration without foreign body of left hand, initial encounter: Secondary | ICD-10-CM | POA: Diagnosis present

## 2016-07-09 DIAGNOSIS — F10229 Alcohol dependence with intoxication, unspecified: Secondary | ICD-10-CM | POA: Diagnosis present

## 2016-07-09 DIAGNOSIS — R4182 Altered mental status, unspecified: Secondary | ICD-10-CM | POA: Diagnosis present

## 2016-07-09 DIAGNOSIS — W19XXXA Unspecified fall, initial encounter: Secondary | ICD-10-CM

## 2016-07-09 DIAGNOSIS — F10920 Alcohol use, unspecified with intoxication, uncomplicated: Secondary | ICD-10-CM | POA: Diagnosis not present

## 2016-07-09 DIAGNOSIS — W010XXA Fall on same level from slipping, tripping and stumbling without subsequent striking against object, initial encounter: Secondary | ICD-10-CM | POA: Diagnosis not present

## 2016-07-09 DIAGNOSIS — Y907 Blood alcohol level of 200-239 mg/100 ml: Secondary | ICD-10-CM | POA: Diagnosis not present

## 2016-07-09 DIAGNOSIS — I214 Non-ST elevation (NSTEMI) myocardial infarction: Secondary | ICD-10-CM | POA: Diagnosis not present

## 2016-07-09 DIAGNOSIS — I5022 Chronic systolic (congestive) heart failure: Secondary | ICD-10-CM | POA: Diagnosis present

## 2016-07-09 DIAGNOSIS — E871 Hypo-osmolality and hyponatremia: Secondary | ICD-10-CM | POA: Diagnosis not present

## 2016-07-09 DIAGNOSIS — Z79899 Other long term (current) drug therapy: Secondary | ICD-10-CM | POA: Diagnosis not present

## 2016-07-09 DIAGNOSIS — S022XXA Fracture of nasal bones, initial encounter for closed fracture: Secondary | ICD-10-CM | POA: Diagnosis present

## 2016-07-09 DIAGNOSIS — S0101XA Laceration without foreign body of scalp, initial encounter: Secondary | ICD-10-CM | POA: Diagnosis present

## 2016-07-09 DIAGNOSIS — Z23 Encounter for immunization: Secondary | ICD-10-CM | POA: Diagnosis present

## 2016-07-09 DIAGNOSIS — R9431 Abnormal electrocardiogram [ECG] [EKG]: Secondary | ICD-10-CM | POA: Diagnosis not present

## 2016-07-09 DIAGNOSIS — R748 Abnormal levels of other serum enzymes: Secondary | ICD-10-CM | POA: Diagnosis not present

## 2016-07-09 DIAGNOSIS — E039 Hypothyroidism, unspecified: Secondary | ICD-10-CM | POA: Diagnosis present

## 2016-07-09 LAB — BASIC METABOLIC PANEL
Anion gap: 7 (ref 5–15)
BUN: 5 mg/dL — AB (ref 6–20)
CHLORIDE: 101 mmol/L (ref 101–111)
CO2: 25 mmol/L (ref 22–32)
CREATININE: 0.65 mg/dL (ref 0.44–1.00)
Calcium: 8.8 mg/dL — ABNORMAL LOW (ref 8.9–10.3)
GFR calc Af Amer: >60 mL/min (ref >60)
GFR calc non Af Amer: >60 mL/min (ref >60)
Glucose, Bld: 98 mg/dL (ref 65–99)
POTASSIUM: 3.9 mmol/L (ref 3.5–5.1)
Sodium: 133 mmol/L — ABNORMAL LOW (ref 135–145)

## 2016-07-09 LAB — CBC
HEMATOCRIT: 36.6 % (ref 36.0–46.0)
HEMOGLOBIN: 12.7 g/dL (ref 12.0–15.0)
MCH: 30.5 pg (ref 26.0–34.0)
MCHC: 34.7 g/dL (ref 30.0–36.0)
MCV: 88 fL (ref 78.0–100.0)
Platelets: 229 10*3/uL (ref 150–400)
RBC: 4.16 MIL/uL (ref 3.87–5.11)
RDW: 14.9 % (ref 11.5–15.5)
WBC: 6.6 10*3/uL (ref 4.0–10.5)

## 2016-07-09 LAB — HEPARIN LEVEL (UNFRACTIONATED)
HEPARIN UNFRACTIONATED: 0.14 [IU]/mL — AB (ref 0.30–0.70)
HEPARIN UNFRACTIONATED: 0.18 [IU]/mL — AB (ref 0.30–0.70)

## 2016-07-09 NOTE — Evaluation (Signed)
Physical Therapy Evaluation Patient Details Name: Pam [name] MRN: [id number] DOB: [birth year] Today's Date: 07/09/2016   History of Present Illness  Pt is a 65 y/o female admitted secondary to falling at home. Pt had been drinking alcohol prior to fall and attempted to take the trash out and fell on her face. Head CT negative. Incidental findings of ST elevation. PMH including but not limited to hypothyroidism and hyperlipidemia.  Clinical Impression  Pt presented supine in bed with HOB elevated, awake and willing to participate in therapy session. Prior to admission pt reported that she was independent with functional mobility and ADLs. Pt currently requires min guard for all mobility for safety only, no physical assistance or AD needed. Pt would continue to benefit from skilled physical therapy services at this time while admitted to address her below listed limitations in order to improve her overall safety and independence with functional mobility.      Follow Up Recommendations No PT follow up;Supervision - Intermittent    Equipment Recommendations  None recommended by PT    Recommendations for Other Services       Precautions / Restrictions Precautions Precautions: Fall Restrictions Weight Bearing Restrictions: No      Mobility  Bed Mobility Overal bed mobility: Needs Assistance Bed Mobility: Supine to Sit;Sit to Supine     Supine to sit: Min guard;HOB elevated Sit to supine: Min guard   General bed mobility comments: pt required increased time, min guard for safety  Transfers Overall transfer level: Needs assistance Equipment used: None Transfers: Sit to/from Stand Sit to Stand: Min guard         General transfer comment: pt required increased time, min guard for safety  Ambulation/Gait Ambulation/Gait assistance: Min guard Ambulation Distance (Feet): 100 Feet Assistive device: 1 person hand held assist Gait Pattern/deviations: Step-through  pattern;Decreased stride length Gait velocity: decreased Gait velocity interpretation: Below normal speed for age/gender General Gait Details: no instability or LOB during ambulation. pt able to ambulate without 1 person HHA (husband was being overprotective)  Financial trader Rankin (Stroke Patients Only)       Balance Overall balance assessment: Needs assistance Sitting-balance support: Feet supported;No upper extremity supported Sitting balance-Leahy Scale: Good     Standing balance support: During functional activity;No upper extremity supported Standing balance-Leahy Scale: Fair                               Pertinent Vitals/Pain Pain Assessment: Faces Faces Pain Scale: Hurts a little bit Pain Location: forehead Pain Descriptors / Indicators: Headache Pain Intervention(s): Monitored during session    Home Living Family/patient expects to be discharged to:: Private residence Living Arrangements: Spouse/significant other Available Help at Discharge: Family;Available 24 hours/day Type of Home: House Home Access: Stairs to enter Entrance Stairs-Rails: None Entrance Stairs-Number of Steps: 1 Home Layout: One level Home Equipment: None      Prior Function Level of Independence: Independent               Hand Dominance        Extremity/Trunk Assessment   Upper Extremity Assessment: Overall WFL for tasks assessed           Lower Extremity Assessment: Overall WFL for tasks assessed      Cervical / Trunk Assessment: Normal  Communication   Communication: No difficulties  Cognition Arousal/Alertness: Awake/alert Behavior  During Therapy: WFL for tasks assessed/performed Overall Cognitive Status: Within Functional Limits for tasks assessed                      General Comments General comments (skin integrity, edema, etc.): edema and bruising around bilateral eyes (L > R)    Exercises      Assessment/Plan    PT Assessment Patient needs continued PT services  PT Problem List Decreased activity tolerance;Decreased balance;Decreased mobility;Decreased coordination;Pain          PT Treatment Interventions DME instruction;Gait training;Stair training;Functional mobility training;Therapeutic activities;Therapeutic exercise;Balance training;Neuromuscular re-education;Patient/family education    PT Goals (Current goals can be found in the Care Plan section)  Acute Rehab PT Goals Patient Stated Goal: return home PT Goal Formulation: With patient/family Time For Goal Achievement: 07/23/16 Potential to Achieve Goals: Good    Frequency Min 3X/week   Barriers to discharge        Co-evaluation               End of Session Equipment Utilized During Treatment: Gait belt Activity Tolerance: Patient tolerated treatment well Patient left: in bed;with call bell/phone within reach;with family/visitor present Nurse Communication: Mobility status    Functional Assessment Tool Used: clinical judgement Functional Limitation: Mobility: Walking and moving around Mobility: Walking and Moving Around Current Status [phone]): At least 1 percent but less than 20 percent impaired, limited or restricted Mobility: Walking and Moving Around Goal Status [phone]): 0 percent impaired, limited or restricted    Time: 1521-1547 PT Time Calculation (min) (ACUTE ONLY): 26 min   Charges:   PT Evaluation $PT Eval Moderate Complexity: 1 Procedure PT Treatments $Gait Training: 8-22 mins   PT G Codes:   PT G-Codes **NOT FOR INPATIENT CLASS** Functional Assessment Tool Used: clinical judgement Functional Limitation: Mobility: Walking and moving around Mobility: Walking and Moving Around Current Status [id number]): At least 1 percent but less than 20 percent impaired, limited or restricted Mobility: Walking and Moving Around Goal Status [phone]): 0 percent impaired, limited or restricted     [facility] 07/09/2016, 4:33 PM Sherie Don, [place], DPT [phone]

## 2016-07-09 NOTE — Progress Notes (Signed)
ANTICOAGULATION CONSULT NOTE  Pharmacy Consult for heparin  Indication: chest pain/ACS  No Known Allergies  Patient Measurements: Height: 5\' 3"  (160 cm) Weight: 120 lb 11.2 oz (54.7 kg) (scale a) IBW/kg (Calculated) : 52.4 Heparin Dosing Weight: 54.9 kg   Vital Signs: Temp: 97.6 F (36.4 C) (12/09 0925) Temp Source: Oral (12/09 0925) BP: 114/49 (12/09 0925) Pulse Rate: 66 (12/09 0925)  Labs:  Recent Labs  07/07/16 1914  07/08/16 0140 07/08/16 0645 07/08/16 1300 07/09/16 0427 07/09/16 0428 07/09/16 1417  HGB 14.6  --  14.0  --   --  12.7  --   --   HCT 41.2  --  39.7  --   --  36.6  --   --   PLT 292  --  273  --   --  229  --   --   APTT 29  --   --   --   --   --   --   --   LABPROT 13.7  --   --   --   --   --   --   --   INR 1.04  --   --   --   --   --   --   --   HEPARINUNFRC  --   --   --   --   --   --  0.14* 0.18*  CREATININE 0.68  --  0.60  --   --  0.65  --   --   TROPONINI <0.03  < > 0.17* 0.27* 0.20*  --   --   --   < > = values in this interval not displayed.  Estimated Creatinine Clearance: 58.8 mL/min (by C-G formula based on SCr of 0.65 mg/dL).  Assessment: 65 y.o. female with CAD awaiting PCI bs CABG, continues on heparin PM heparin level = 0.18  Goal of Therapy:  Heparin level 0.3-0.7 units/ml Monitor platelets by anticoagulation protocol: Yes   Plan:  Increase Heparin 1050 units/hr Check heparin level in 8 hours.   Thank you Anette Guarneri, PharmD [phone] 07/09/2016 3:11 PM

## 2016-07-09 NOTE — Progress Notes (Signed)
Patient Name: Pam [name] Date of Encounter: 07/09/2016  Primary Cardiologist: Dr. Lorretta Harp Problem List     Principal Problem:   Fall Active Problems:   Abnormal EKG   HLD (hyperlipidemia)   Hypothyroidism     Subjective   Suffered mechanical fall, facial laceration. Incidental finding of ST elevation anterior leads. Underwent heart catheterization which showed multivessel disease. Awaiting cardiac MRI.  No current chest pain. No shortness of breath. Sitting up in chair.  Inpatient Medications    Scheduled Meds: . aspirin  81 mg Oral Daily  . atorvastatin  40 mg Oral Daily  . carvedilol  3.125 mg Oral BID WC  . folic acid  1 mg Oral Daily  . levothyroxine  25 mcg Oral QAC breakfast  . LORazepam  0-4 mg Oral Q6H   Followed by  . [START ON 07/10/2016] LORazepam  0-4 mg Oral Q12H  . multivitamin with minerals  1 tablet Oral Daily  . sodium chloride flush  3 mL Intravenous Q12H  . thiamine  100 mg Oral Daily   Continuous Infusions: . heparin 850 Units/hr (07/09/16 0637)   PRN Meds: sodium chloride, acetaminophen **OR** acetaminophen, LORazepam **OR** LORazepam, ondansetron **OR** ondansetron (ZOFRAN) IV, oxyCODONE, sodium chloride flush   Vital Signs    Vitals:   07/09/16 0200 07/09/16 0454 07/09/16 0600 07/09/16 0925  BP: (!) 129/52 (!) 111/52  (!) 114/49  Pulse: 68 64 62 66  Resp:  18  18  Temp:  97.8 F (36.6 C)  97.6 F (36.4 C)  TempSrc:  Oral  Oral  SpO2:  95%  97%  Weight:  120 lb 11.2 oz (54.7 kg)    Height:        Intake/Output Summary (Last 24 hours) at 07/09/16 1130 Last data filed at 07/09/16 0900  Gross per 24 hour  Intake             1497 ml  Output             2875 ml  Net            -1378 ml   Filed Weights   07/07/16 2056 07/08/16 1337 07/09/16 0454  Weight: 121 lb (54.9 kg) 121 lb (54.9 kg) 120 lb 11.2 oz (54.7 kg)    Physical Exam    GEN:  Thin, in no acute distress. In chair HEENT: Facial laceration sutured, marked  ecchymosis surrounding eyes Neck: Supple, no JVD, carotid bruits, or masses. Cardiac: RRR, no murmurs, rubs, or gallops. No clubbing, cyanosis, edema.  Radials/DP/PT 2+ and equal bilaterally.  Respiratory:  Respirations regular and unlabored, clear to auscultation bilaterally. GI: Soft, nontender, nondistended, BS + x 4. MS: no deformity or atrophy. Skin: warm and dry, no rash. Neuro:  Strength and sensation are intact. Psych: AAOx3.  Normal affect.  Labs    CBC  Recent Labs  07/07/16 1914 07/08/16 0140 07/09/16 0427  WBC 14.0* 12.5* 6.6  NEUTROABS 10.4*  --   --   HGB 14.6 14.0 12.7  HCT 41.2 39.7 36.6  MCV 87.3 85.9 88.0  PLT 292 273 [id number]   Basic Metabolic Panel  Recent Labs  07/08/16 0140 07/09/16 0427  NA 131* 133*  K 3.9 3.9  CL 100* 101  CO2 19* 25  GLUCOSE 87 98  BUN 5* 5*  CREATININE 0.60 0.65  CALCIUM 8.8* 8.8*   Liver Function Tests  Recent Labs  07/07/16 1914  AST 31  ALT 27  ALKPHOS 58  BILITOT 0.8  PROT 6.4*  ALBUMIN 3.8   No results for input(s): LIPASE, AMYLASE in the last 72 hours. Cardiac Enzymes  Recent Labs  07/08/16 0140 07/08/16 0645 07/08/16 1300  TROPONINI 0.17* 0.27* 0.20*     Recent Labs  07/07/16 1914  CHOL 174  HDL 87  LDLCALC 70  TRIG 87  CHOLHDL 2.0   Thyroid Function Tests  Recent Labs  07/08/16 0617  TSH 2.454    Telemetry    NSR, no adverse arrhythmias - Personally Reviewed  ECG    07/08/16-biphasic T wave noted in V2-ischemic pattern - Personally Reviewed  Radiology    Ct Head Wo Contrast  Result Date: 07/07/2016 CLINICAL DATA:  Patient found down on the ground after alcohol consumption. EXAM: CT HEAD WITHOUT CONTRAST CT MAXILLOFACIAL WITHOUT CONTRAST CT CERVICAL SPINE WITHOUT CONTRAST TECHNIQUE: Multidetector CT imaging of the head, cervical spine, and maxillofacial structures were performed using the standard protocol without intravenous contrast. Multiplanar CT image reconstructions of the  cervical spine and maxillofacial structures were also generated. COMPARISON:  None. FINDINGS: CT HEAD FINDINGS Brain: No evidence of acute infarction, hemorrhage, hydrocephalus, extra-axial collection or mass lesion/mass effect. Mild generalized atrophy. Vascular: Atherosclerosis of skullbase vasculature without hyperdense vessel or abnormal calcification. Skull: Right frontal scalp hematoma without calvarial fracture. Other: None. CT MAXILLOFACIAL FINDINGS Osseous: Nasal bone fracture, mildly displaced. There is mild rightward nasal septal swelling with possible fracture posteriorly, no nasal septal hematoma. No additional facial bone fracture. Zygomatic arches are intact. No mandibular fracture, temporomandibular joints are congruent. Orbits: Right supraorbital soft tissue edema. No orbital fracture. Both globes are intact. Sinuses: Clear. Soft tissues: Soft tissue edema about the chin. CT CERVICAL SPINE FINDINGS Alignment: Straightening of normal lordosis. No listhesis, jumped or perched facets. Skull base and vertebrae: No acute fracture. No primary bone lesion or focal pathologic process. Soft tissues and spinal canal: No prevertebral fluid or swelling. No visible canal hematoma. Disc levels: Multilevel disc space narrowing and endplate spurring is most prominent at C5-C6 and C6-C7. There is multilevel facet arthropathy. Upper chest: Emphysema, advanced. Azygos fissure is incidentally noted. Other: Carotid atherosclerosis. IMPRESSION: 1. No acute intracranial abnormality. Right frontal scalp hematoma without skull fracture. 2. Minimally depressed nasal bone fracture. No additional facial bone fracture. 3. Degenerative change throughout cervical spine. No acute fracture or subluxation. 4. Emphysema noted at the lung apices. 5. Carotid and intracranial atherosclerosis. Electronically Signed   By: Jeb Levering M.D.   On: 07/07/2016 20:38   Ct Cervical Spine Wo Contrast  Result Date: 07/07/2016 CLINICAL  DATA:  Patient found down on the ground after alcohol consumption. EXAM: CT HEAD WITHOUT CONTRAST CT MAXILLOFACIAL WITHOUT CONTRAST CT CERVICAL SPINE WITHOUT CONTRAST TECHNIQUE: Multidetector CT imaging of the head, cervical spine, and maxillofacial structures were performed using the standard protocol without intravenous contrast. Multiplanar CT image reconstructions of the cervical spine and maxillofacial structures were also generated. COMPARISON:  None. FINDINGS: CT HEAD FINDINGS Brain: No evidence of acute infarction, hemorrhage, hydrocephalus, extra-axial collection or mass lesion/mass effect. Mild generalized atrophy. Vascular: Atherosclerosis of skullbase vasculature without hyperdense vessel or abnormal calcification. Skull: Right frontal scalp hematoma without calvarial fracture. Other: None. CT MAXILLOFACIAL FINDINGS Osseous: Nasal bone fracture, mildly displaced. There is mild rightward nasal septal swelling with possible fracture posteriorly, no nasal septal hematoma. No additional facial bone fracture. Zygomatic arches are intact. No mandibular fracture, temporomandibular joints are congruent. Orbits: Right supraorbital soft tissue edema. No orbital fracture. Both globes are intact. Sinuses: Clear. Soft  tissues: Soft tissue edema about the chin. CT CERVICAL SPINE FINDINGS Alignment: Straightening of normal lordosis. No listhesis, jumped or perched facets. Skull base and vertebrae: No acute fracture. No primary bone lesion or focal pathologic process. Soft tissues and spinal canal: No prevertebral fluid or swelling. No visible canal hematoma. Disc levels: Multilevel disc space narrowing and endplate spurring is most prominent at C5-C6 and C6-C7. There is multilevel facet arthropathy. Upper chest: Emphysema, advanced. Azygos fissure is incidentally noted. Other: Carotid atherosclerosis. IMPRESSION: 1. No acute intracranial abnormality. Right frontal scalp hematoma without skull fracture. 2. Minimally  depressed nasal bone fracture. No additional facial bone fracture. 3. Degenerative change throughout cervical spine. No acute fracture or subluxation. 4. Emphysema noted at the lung apices. 5. Carotid and intracranial atherosclerosis. Electronically Signed   By: Jeb Levering M.D.   On: 07/07/2016 20:38   Ct Maxillofacial Wo Contrast  Result Date: 07/07/2016 CLINICAL DATA:  Patient found down on the ground after alcohol consumption. EXAM: CT HEAD WITHOUT CONTRAST CT MAXILLOFACIAL WITHOUT CONTRAST CT CERVICAL SPINE WITHOUT CONTRAST TECHNIQUE: Multidetector CT imaging of the head, cervical spine, and maxillofacial structures were performed using the standard protocol without intravenous contrast. Multiplanar CT image reconstructions of the cervical spine and maxillofacial structures were also generated. COMPARISON:  None. FINDINGS: CT HEAD FINDINGS Brain: No evidence of acute infarction, hemorrhage, hydrocephalus, extra-axial collection or mass lesion/mass effect. Mild generalized atrophy. Vascular: Atherosclerosis of skullbase vasculature without hyperdense vessel or abnormal calcification. Skull: Right frontal scalp hematoma without calvarial fracture. Other: None. CT MAXILLOFACIAL FINDINGS Osseous: Nasal bone fracture, mildly displaced. There is mild rightward nasal septal swelling with possible fracture posteriorly, no nasal septal hematoma. No additional facial bone fracture. Zygomatic arches are intact. No mandibular fracture, temporomandibular joints are congruent. Orbits: Right supraorbital soft tissue edema. No orbital fracture. Both globes are intact. Sinuses: Clear. Soft tissues: Soft tissue edema about the chin. CT CERVICAL SPINE FINDINGS Alignment: Straightening of normal lordosis. No listhesis, jumped or perched facets. Skull base and vertebrae: No acute fracture. No primary bone lesion or focal pathologic process. Soft tissues and spinal canal: No prevertebral fluid or swelling. No visible canal  hematoma. Disc levels: Multilevel disc space narrowing and endplate spurring is most prominent at C5-C6 and C6-C7. There is multilevel facet arthropathy. Upper chest: Emphysema, advanced. Azygos fissure is incidentally noted. Other: Carotid atherosclerosis. IMPRESSION: 1. No acute intracranial abnormality. Right frontal scalp hematoma without skull fracture. 2. Minimally depressed nasal bone fracture. No additional facial bone fracture. 3. Degenerative change throughout cervical spine. No acute fracture or subluxation. 4. Emphysema noted at the lung apices. 5. Carotid and intracranial atherosclerosis. Electronically Signed   By: Jeb Levering M.D.   On: 07/07/2016 20:38    Cardiac Studies   Cardiac catheterization 07/08/16: Conclusions: 1.  Significant 2-vessel coronary artery disease including:  Heavily calcified chronic total occlusion of proximal/mid LAD at the origin of 1st septal branch with left-to-left and right-to-left collaterals.  Calcified 80% stenosis of proximal LCx/OM1 with TIMI-3 flow. 2.  Severely reduced LV contraction (LVEF ~30%) with anterior, apical, and apical inferior dyskinesis. 3.  Low normal left ventricular filling pressure (LVEDP 4 mmHg).  Recommendations: 1.  Medical management of NSTEMI, though I suspect mild troponin elevation most likely reflects supply-demand mismatch (type 2 MI) rather than acute plaque rupture.  I would treat with heparin infusion for 48-72 hours and continue low-dose aspirin. 2.  Recommend cardiac MRI to assess viability of LAD territory.  If there is viability, revascularization of LAD  and LCx/OM1 (by PCI or CABG) should be considered.  If the LAD territory is non-viable, PCI to LCx/OM1 should be considered. 3.  Continue secondary prevention with ASA and statin.  Will start carvediolol 3.125 mg BID for CAD and cardiomyopathy. 4.  Hydrate gently overnight, given low LVEDP with reduced LV contraction. 5.  Given unclear circumstances  surrounding the patient's fall yesterday, electrophysiology consultation may be beneficial, as tachyarrhythmia due to prior MI/cardiomyopathy could have precipitated yesterday's event (though alcohol intoxication confounds the picture).  [name], MD  Patient Profile     65 year old with mechanical fall, facial laceration, abnormal EKG with troponin elevation of 0.27, likely type II non-ST elevation myocardial infarction who underwent cardiac catheterization demonstrating a chronically occluded LAD. Awaiting cardiac MRI for viability.  Assessment & Plan    Myocardial infarction type II-supply demand mismatch likely from chronically occluded LAD  - Continue IV heparin for a total of 48 hours  - Aspirin 81  - Carvedilol 3.125 twice a day has been started given ejection fraction approximately 35%.  - Given what appears to be chronically occluded LAD, it was suggested that a cardiac MRI be performed to undergo viability study. If there is no obvious viability along the anterior wall in the LAD distribution, PCI to the obtuse marginal would be recommended. If there is viability along the anterior wall, 2 vessel bypass would be suggested.  Ischemic cardiomyopathy  - Carvedilol started.  - Low LVEDP.  - Was gently hydrated.  Fall with scalp laceration  - Patient adamant this was not syncope but mechanical.  - Alcohol intoxication, level 221 noted on admission.  Signed, Candee Furbish, MD  07/09/2016, 11:30 AM

## 2016-07-09 NOTE — Progress Notes (Signed)
PROGRESS NOTE                                                                                                                                                                                                             Patient Demographics:    Pam [name], is a 65 y.o. female, DOB - 1950-08-04, DY:[id number]  Admit date - 07/07/2016   Admitting Physician Rise Patience, MD  Outpatient Primary MD for the patient is Pcp Not In System  LOS - 0  Chief Complaint  Patient presents with  . Fall  . Facial Injury       Brief Narrative    Pam [name] is a 65 y.o. female with history of hyperlipidemia hypothyroidism was brought to the ER after patient had a fall. Patient states she was planning to dispose the trash when she suddenly slipped and fell. She fell onto her face. Denies losing consciousness. Denies any chest pain shortness of breath palpitations. EMS was called and EKG was showing ST elevation in the anterior leads and on-call cardiologist was consulted. Initial troponin was negative and patient did not have chest pain. Cardiologist felt that patient at this time did not meet the criteria for ST elevation MI and advised observation.  CT head neck and maxillofacial was done and showed minimally displaced nasal bone fracture and frontal scalp hematoma. Patient had suturing done of the laceration on the face and the left hand. On call ENT surgeon Dr. Redmond Baseman was consulted by the ER physician and Dr. Redmond Baseman advised outpatient follow-up.   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 Cardiology following further workup per cardiology.    Subjective:    [name] today has, No headache, No chest pain, No abdominal pain - No Nausea, No new weakness tingling or numbness, No Cough - SOB.     Assessment  & Plan :     1. Mechaniical fall with  right-sided scalp hematoma, depressed nasal bone fracture, left hand laceration. She was treated in the ER lacerations were sutured, cleared from trauma standpoint by ER, ER physician called Dr. Redmond Baseman ENT who requested outpatient follow-up. She does have substantial facial bruising. Will monitor for 24 hours, PT eval, he is adamant  that this was a mechanical trip fall and she  never syncopized.  2. Incidental finding of nonspecific EKG changes ?? STEMI ( defer to Cards) . Mildly elevated troponin. This is not an ACS pattern, no chest pain, code STEMI was discontinued by cardiology on the day of admission subsequently she underwent left heart cath per cardiology on 07/08/2016 showing multivessel disease, she is currently on heparin drip along with aspirin, Coreg and statin, cardiology is planning for a cardiac MRI to evaluate viability thereafter they will decide on PCI versus CABG.  3. HX of smoking and occasional alcohol intake. Patient states that she drinks 1-2 times a week, counseled to quit both. Monitor on CIWA protocol.  4. Hypothyroidism. On and thyroid continue.  5. Dyslipidemia. On statin.  6. 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   Family Communication  :  Husband  Code Status :  Full  Diet : Diet Heart Room service appropriate? Yes; Fluid consistency: Thin   Disposition Plan  :  Stay inpatient for further cardiac workup  Consults  :  ENT by EDP, Cards  Procedures  :    CT head and neck -  depressed nasal fracture  Left heart cath on 07/08/2016 showing multivessel disease  Due for cardiac MRI on 07/11/2016  DVT Prophylaxis  :  SCDs/Heparin  Lab Results  Component Value Date   PLT 229 07/09/2016    Inpatient Medications  Scheduled Meds: . aspirin  81 mg Oral Daily  . atorvastatin  40 mg Oral Daily  . carvedilol  3.125 mg Oral BID WC  . folic acid  1 mg Oral Daily  . levothyroxine   25 mcg Oral QAC breakfast  . LORazepam  0-4 mg Oral Q6H   Followed by  . [START ON 07/10/2016] LORazepam  0-4 mg Oral Q12H  . multivitamin with minerals  1 tablet Oral Daily  . sodium chloride flush  3 mL Intravenous Q12H  . thiamine  100 mg Oral Daily   Or  . thiamine  100 mg Intravenous Daily   Continuous Infusions: . heparin 850 Units/hr (07/09/16 0637)   PRN Meds:.sodium chloride, acetaminophen **OR** acetaminophen, LORazepam **OR** LORazepam, ondansetron **OR** ondansetron (ZOFRAN) IV, oxyCODONE, sodium chloride flush  Antibiotics  :    Anti-infectives    None         Objective:   Vitals:   07/09/16 0200 07/09/16 0454 07/09/16 0600 07/09/16 0925  BP: (!) 129/52 (!) 111/52  (!) 114/49  Pulse: 68 64 62 66  Resp:  18  18  Temp:  97.8 F (36.6 C)  97.6 F (36.4 C)  TempSrc:  Oral  Oral  SpO2:  95%  97%  Weight:  54.7 kg (120 lb 11.2 oz)    Height:        Wt Readings from Last 3 Encounters:  07/09/16 54.7 kg (120 lb 11.2 oz)  04/13/14 54.4 kg (120 lb)     Intake/Output Summary (Last 24 hours) at 07/09/16 1012 Last data filed at 07/09/16 0900  Gross per 24 hour  Intake             1497 ml  Output             2875 ml  Net            -1378 ml     Physical Exam  Awake Alert, Oriented X 3, No new F.N deficits, Normal affect Nasal fracture, right-sided scalp laceration and a bandage, right periorbital hematoma,PERRAL Supple  Neck,No JVD, No cervical lymphadenopathy appriciated.  Symmetrical Chest wall movement, Good air movement bilaterally, CTAB RRR,No Gallops,Rubs or new Murmurs, No Parasternal Heave +ve B.Sounds, Abd Soft, No tenderness, No organomegaly appriciated, No rebound - guarding or rigidity. No Cyanosis, Clubbing or edema, No new Rash or bruise     Data Review:    CBC  Recent Labs Lab 07/07/16 1914 07/08/16 0140 07/09/16 0427  WBC 14.0* 12.5* 6.6  HGB 14.6 14.0 12.7  HCT 41.2 39.7 36.6  PLT 292 273 229  MCV 87.3 85.9 88.0  MCH 30.9  30.3 30.5  MCHC 35.4 35.3 34.7  RDW 14.6 14.6 14.9  LYMPHSABS 2.8  --   --   MONOABS 0.7  --   --   EOSABS 0.1  --   --   BASOSABS 0.1  --   --     Chemistries   Recent Labs Lab 07/07/16 1914 07/08/16 0140 07/09/16 0427  NA 125* 131* 133*  K 4.5 3.9 3.9  CL 94* 100* 101  CO2 21* 19* 25  GLUCOSE 91 87 98  BUN 7 5* 5*  CREATININE 0.68 0.60 0.65  CALCIUM 8.9 8.8* 8.8*  AST 31  --   --   ALT 27  --   --   ALKPHOS 58  --   --   BILITOT 0.8  --   --    ------------------------------------------------------------------------------------------------------------------  Recent Labs  07/07/16 1914  CHOL 174  HDL 87  LDLCALC 70  TRIG 87  CHOLHDL 2.0    No results found for: HGBA1C ------------------------------------------------------------------------------------------------------------------  Recent Labs  07/08/16 0617  TSH 2.454   ------------------------------------------------------------------------------------------------------------------ No results for input(s): VITAMINB12, FOLATE, FERRITIN, TIBC, IRON, RETICCTPCT in the last 72 hours.  Coagulation profile  Recent Labs Lab 07/07/16 1914  INR 1.04    No results for input(s): DDIMER in the last 72 hours.  Cardiac Enzymes  Recent Labs Lab 07/08/16 0140 07/08/16 0645 07/08/16 1300  TROPONINI 0.17* 0.27* 0.20*   ------------------------------------------------------------------------------------------------------------------ No results found for: BNP  Micro Results No results found for this or any previous visit (from the past 240 hour(s)).  Radiology Reports Ct Head Wo Contrast  Result Date: 07/07/2016 CLINICAL DATA:  Patient found down on the ground after alcohol consumption. EXAM: CT HEAD WITHOUT CONTRAST CT MAXILLOFACIAL WITHOUT CONTRAST CT CERVICAL SPINE WITHOUT CONTRAST TECHNIQUE: Multidetector CT imaging of the head, cervical spine, and maxillofacial structures were performed using the  standard protocol without intravenous contrast. Multiplanar CT image reconstructions of the cervical spine and maxillofacial structures were also generated. COMPARISON:  None. FINDINGS: CT HEAD FINDINGS Brain: No evidence of acute infarction, hemorrhage, hydrocephalus, extra-axial collection or mass lesion/mass effect. Mild generalized atrophy. Vascular: Atherosclerosis of skullbase vasculature without hyperdense vessel or abnormal calcification. Skull: Right frontal scalp hematoma without calvarial fracture. Other: None. CT MAXILLOFACIAL FINDINGS Osseous: Nasal bone fracture, mildly displaced. There is mild rightward nasal septal swelling with possible fracture posteriorly, no nasal septal hematoma. No additional facial bone fracture. Zygomatic arches are intact. No mandibular fracture, temporomandibular joints are congruent. Orbits: Right supraorbital soft tissue edema. No orbital fracture. Both globes are intact. Sinuses: Clear. Soft tissues: Soft tissue edema about the chin. CT CERVICAL SPINE FINDINGS Alignment: Straightening of normal lordosis. No listhesis, jumped or perched facets. Skull base and vertebrae: No acute fracture. No primary bone lesion or focal pathologic process. Soft tissues and spinal canal: No prevertebral fluid or swelling. No visible canal hematoma. Disc levels: Multilevel disc space narrowing and endplate spurring is  most prominent at C5-C6 and C6-C7. There is multilevel facet arthropathy. Upper chest: Emphysema, advanced. Azygos fissure is incidentally noted. Other: Carotid atherosclerosis. IMPRESSION: 1. No acute intracranial abnormality. Right frontal scalp hematoma without skull fracture. 2. Minimally depressed nasal bone fracture. No additional facial bone fracture. 3. Degenerative change throughout cervical spine. No acute fracture or subluxation. 4. Emphysema noted at the lung apices. 5. Carotid and intracranial atherosclerosis. Electronically Signed   By: Jeb Levering M.D.    On: 07/07/2016 20:38   Ct Cervical Spine Wo Contrast  Result Date: 07/07/2016 CLINICAL DATA:  Patient found down on the ground after alcohol consumption. EXAM: CT HEAD WITHOUT CONTRAST CT MAXILLOFACIAL WITHOUT CONTRAST CT CERVICAL SPINE WITHOUT CONTRAST TECHNIQUE: Multidetector CT imaging of the head, cervical spine, and maxillofacial structures were performed using the standard protocol without intravenous contrast. Multiplanar CT image reconstructions of the cervical spine and maxillofacial structures were also generated. COMPARISON:  None. FINDINGS: CT HEAD FINDINGS Brain: No evidence of acute infarction, hemorrhage, hydrocephalus, extra-axial collection or mass lesion/mass effect. Mild generalized atrophy. Vascular: Atherosclerosis of skullbase vasculature without hyperdense vessel or abnormal calcification. Skull: Right frontal scalp hematoma without calvarial fracture. Other: None. CT MAXILLOFACIAL FINDINGS Osseous: Nasal bone fracture, mildly displaced. There is mild rightward nasal septal swelling with possible fracture posteriorly, no nasal septal hematoma. No additional facial bone fracture. Zygomatic arches are intact. No mandibular fracture, temporomandibular joints are congruent. Orbits: Right supraorbital soft tissue edema. No orbital fracture. Both globes are intact. Sinuses: Clear. Soft tissues: Soft tissue edema about the chin. CT CERVICAL SPINE FINDINGS Alignment: Straightening of normal lordosis. No listhesis, jumped or perched facets. Skull base and vertebrae: No acute fracture. No primary bone lesion or focal pathologic process. Soft tissues and spinal canal: No prevertebral fluid or swelling. No visible canal hematoma. Disc levels: Multilevel disc space narrowing and endplate spurring is most prominent at C5-C6 and C6-C7. There is multilevel facet arthropathy. Upper chest: Emphysema, advanced. Azygos fissure is incidentally noted. Other: Carotid atherosclerosis. IMPRESSION: 1. No acute  intracranial abnormality. Right frontal scalp hematoma without skull fracture. 2. Minimally depressed nasal bone fracture. No additional facial bone fracture. 3. Degenerative change throughout cervical spine. No acute fracture or subluxation. 4. Emphysema noted at the lung apices. 5. Carotid and intracranial atherosclerosis. Electronically Signed   By: Jeb Levering M.D.   On: 07/07/2016 20:38   Ct Maxillofacial Wo Contrast  Result Date: 07/07/2016 CLINICAL DATA:  Patient found down on the ground after alcohol consumption. EXAM: CT HEAD WITHOUT CONTRAST CT MAXILLOFACIAL WITHOUT CONTRAST CT CERVICAL SPINE WITHOUT CONTRAST TECHNIQUE: Multidetector CT imaging of the head, cervical spine, and maxillofacial structures were performed using the standard protocol without intravenous contrast. Multiplanar CT image reconstructions of the cervical spine and maxillofacial structures were also generated. COMPARISON:  None. FINDINGS: CT HEAD FINDINGS Brain: No evidence of acute infarction, hemorrhage, hydrocephalus, extra-axial collection or mass lesion/mass effect. Mild generalized atrophy. Vascular: Atherosclerosis of skullbase vasculature without hyperdense vessel or abnormal calcification. Skull: Right frontal scalp hematoma without calvarial fracture. Other: None. CT MAXILLOFACIAL FINDINGS Osseous: Nasal bone fracture, mildly displaced. There is mild rightward nasal septal swelling with possible fracture posteriorly, no nasal septal hematoma. No additional facial bone fracture. Zygomatic arches are intact. No mandibular fracture, temporomandibular joints are congruent. Orbits: Right supraorbital soft tissue edema. No orbital fracture. Both globes are intact. Sinuses: Clear. Soft tissues: Soft tissue edema about the chin. CT CERVICAL SPINE FINDINGS Alignment: Straightening of normal lordosis. No listhesis, jumped or perched facets. Skull base  and vertebrae: No acute fracture. No primary bone lesion or focal pathologic  process. Soft tissues and spinal canal: No prevertebral fluid or swelling. No visible canal hematoma. Disc levels: Multilevel disc space narrowing and endplate spurring is most prominent at C5-C6 and C6-C7. There is multilevel facet arthropathy. Upper chest: Emphysema, advanced. Azygos fissure is incidentally noted. Other: Carotid atherosclerosis. IMPRESSION: 1. No acute intracranial abnormality. Right frontal scalp hematoma without skull fracture. 2. Minimally depressed nasal bone fracture. No additional facial bone fracture. 3. Degenerative change throughout cervical spine. No acute fracture or subluxation. 4. Emphysema noted at the lung apices. 5. Carotid and intracranial atherosclerosis. Electronically Signed   By: Jeb Levering M.D.   On: 07/07/2016 20:38    Time Spent in minutes  30   Jakyri Brunkhorst K M.D on 07/09/2016 at 10:12 AM  Between 7am to 7pm - Pager - [phone]  After 7pm go to [email or web address] - password [facility]  [facility] -  Office  [phone]

## 2016-07-09 NOTE — Progress Notes (Signed)
ANTICOAGULATION CONSULT NOTE  Pharmacy Consult for heparin  Indication: chest pain/ACS  No Known Allergies  Patient Measurements: Height: 5\' 3"  (160 cm) Weight: 120 lb 11.2 oz (54.7 kg) (scale a) IBW/kg (Calculated) : 52.4 Heparin Dosing Weight: 54.9 kg   Vital Signs: Temp: 97.8 F (36.6 C) (12/09 0454) Temp Source: Oral (12/09 0454) BP: 111/52 (12/09 0454) Pulse Rate: 62 (12/09 0600)  Labs:  Recent Labs  07/07/16 1914  07/08/16 0140 07/08/16 0645 07/08/16 1300 07/09/16 0427 07/09/16 0428  HGB 14.6  --  14.0  --   --  12.7  --   HCT 41.2  --  39.7  --   --  36.6  --   PLT 292  --  273  --   --  229  --   APTT 29  --   --   --   --   --   --   LABPROT 13.7  --   --   --   --   --   --   INR 1.04  --   --   --   --   --   --   HEPARINUNFRC  --   --   --   --   --   --  0.14*  CREATININE 0.68  --  0.60  --   --  0.65  --   TROPONINI <0.03  < > 0.17* 0.27* 0.20*  --   --   < > = values in this interval not displayed.  Estimated Creatinine Clearance: 58.8 mL/min (by C-G formula based on SCr of 0.65 mg/dL).  Assessment: 65 y.o. female with CAD awaiting PCI bs CABG, for heparin  Goal of Therapy:  Heparin level 0.3-0.7 units/ml Monitor platelets by anticoagulation protocol: Yes   Plan:  Increase Heparin 850 units/hr Check heparin level in 8 hours.   Phillis Knack, PharmD, BCPS  07/09/2016 6:22 AM

## 2016-07-10 LAB — HEPARIN LEVEL (UNFRACTIONATED)
HEPARIN UNFRACTIONATED: 0.43 [IU]/mL (ref 0.30–0.70)
HEPARIN UNFRACTIONATED: 0.61 [IU]/mL (ref 0.30–0.70)

## 2016-07-10 LAB — CBC
HEMATOCRIT: 35.8 % — AB (ref 36.0–46.0)
HEMOGLOBIN: 12.3 g/dL (ref 12.0–15.0)
MCH: 30.3 pg (ref 26.0–34.0)
MCHC: 34.4 g/dL (ref 30.0–36.0)
MCV: 88.2 fL (ref 78.0–100.0)
Platelets: 208 10*3/uL (ref 150–400)
RBC: 4.06 MIL/uL (ref 3.87–5.11)
RDW: 15.1 % (ref 11.5–15.5)
WBC: 6.8 10*3/uL (ref 4.0–10.5)

## 2016-07-10 LAB — HEMOGLOBIN A1C
Hgb A1c MFr Bld: 5.3 % (ref 4.8–5.6)
MEAN PLASMA GLUCOSE: 105 mg/dL

## 2016-07-10 NOTE — Progress Notes (Signed)
ANTICOAGULATION CONSULT NOTE - Follow Up Consult  Pharmacy Consult for Heparin  Indication: Multi-vessel disease awaiting definitive treatment  No Known Allergies  Patient Measurements: Height: 5\' 3"  (160 cm) Weight: 119 lb 8 oz (54.2 kg) IBW/kg (Calculated) : 52.4  Vital Signs: Temp: 97.4 F (36.3 C) (12/10 0437) Temp Source: Oral (12/10 0437) BP: 116/51 (12/10 0805) Pulse Rate: 73 (12/10 0805)  Labs:  Recent Labs  07/07/16 1914  07/08/16 0140 07/08/16 0645 07/08/16 1300 07/09/16 0427  07/09/16 1417 07/09/16 2334 07/10/16 0738  HGB 14.6  --  14.0  --   --  12.7  --   --  12.3  --   HCT 41.2  --  39.7  --   --  36.6  --   --  35.8*  --   PLT 292  --  273  --   --  229  --   --  208  --   APTT 29  --   --   --   --   --   --   --   --   --   LABPROT 13.7  --   --   --   --   --   --   --   --   --   INR 1.04  --   --   --   --   --   --   --   --   --   HEPARINUNFRC  --   --   --   --   --   --   < > 0.18* 0.43 0.61  CREATININE 0.68  --  0.60  --   --  0.65  --   --   --   --   TROPONINI <0.03  < > 0.17* 0.27* 0.20*  --   --   --   --   --   < > = values in this interval not displayed.  Estimated Creatinine Clearance: 58.8 mL/min (by C-G formula based on SCr of 0.65 mg/dL).   Assessment: 65 y.o. female with CAD awaiting PCI bs CABG, continues on heparin. Pharmacy consulted to dose heparin. Heparin level therapeutic x 2. CBC stable.   Goal of Therapy:  Heparin level 0.3-0.7 units/ml Monitor platelets by anticoagulation protocol: Yes   Plan:  Continue heparin at 1050 units/hr Daily heparin level, CBC Monitor for S&S of bleed  Angela Burke, PharmD, BCPS Pharmacy Resident Pager: [phone] 07/10/2016,9:16 AM

## 2016-07-10 NOTE — Progress Notes (Signed)
ANTICOAGULATION CONSULT NOTE - Follow Up Consult  Pharmacy Consult for Heparin  Indication: Multi-vessel disease awaiting definitive treatment  No Known Allergies  Patient Measurements: Height: 5\' 3"  (160 cm) Weight: 120 lb 11.2 oz (54.7 kg) (scale a) IBW/kg (Calculated) : 52.4  Vital Signs: Temp: 97.5 F (36.4 C) (12/09 1947) Temp Source: Oral (12/09 1947) BP: 137/59 (12/09 1947) Pulse Rate: 70 (12/09 1947)  Labs:  Recent Labs  07/07/16 1914  07/08/16 0140 07/08/16 0645 07/08/16 1300 07/09/16 0427 07/09/16 0428 07/09/16 1417 07/09/16 2334  HGB 14.6  --  14.0  --   --  12.7  --   --  12.3  HCT 41.2  --  39.7  --   --  36.6  --   --  35.8*  PLT 292  --  273  --   --  229  --   --  208  APTT 29  --   --   --   --   --   --   --   --   LABPROT 13.7  --   --   --   --   --   --   --   --   INR 1.04  --   --   --   --   --   --   --   --   HEPARINUNFRC  --   --   --   --   --   --  0.14* 0.18* 0.43  CREATININE 0.68  --  0.60  --   --  0.65  --   --   --   TROPONINI <0.03  < > 0.17* 0.27* 0.20*  --   --   --   --   < > = values in this interval not displayed.  Estimated Creatinine Clearance: 58.8 mL/min (by C-G formula based on SCr of 0.65 mg/dL).   Assessment: On heparin s/p cath with multi-vessel disease, awaiting cardiac MRI to decide CABG vs PCI, heparin level therapeutic x 1   Goal of Therapy:  Heparin level 0.3-0.7 units/ml Monitor platelets by anticoagulation protocol: Yes   Plan:  -Cont heparin 1050 units/hr -0800 HL  Maureena Dabbs 07/10/2016,12:52 AM

## 2016-07-10 NOTE — Progress Notes (Signed)
Patient Name: Pam [name] Date of Encounter: 07/10/2016  Primary Cardiologist: Dr. Lorretta Harp Problem List     Principal Problem:   Fall Active Problems:   Abnormal EKG   HLD (hyperlipidemia)   Hypothyroidism   Facial laceration   Coronary artery disease involving native coronary artery of native heart without angina pectoris     Subjective   Suffered mechanical fall, facial laceration. Incidental finding of ST elevation anterior leads. Underwent heart catheterization which showed multivessel disease. Awaiting cardiac MRI.  No current chest pain. No shortness of breath. Sitting up in bed  Inpatient Medications    Scheduled Meds: . aspirin  81 mg Oral Daily  . atorvastatin  40 mg Oral Daily  . carvedilol  3.125 mg Oral BID WC  . folic acid  1 mg Oral Daily  . levothyroxine  25 mcg Oral QAC breakfast  . LORazepam  0-4 mg Oral Q12H  . multivitamin with minerals  1 tablet Oral Daily  . sodium chloride flush  3 mL Intravenous Q12H  . thiamine  100 mg Oral Daily   Continuous Infusions: . heparin 1,050 Units/hr (07/10/16 0735)   PRN Meds: sodium chloride, acetaminophen **OR** acetaminophen, LORazepam **OR** LORazepam, ondansetron **OR** ondansetron (ZOFRAN) IV, oxyCODONE, sodium chloride flush   Vital Signs    Vitals:   07/09/16 1230 07/09/16 1947 07/10/16 0437 07/10/16 0805  BP: (!) 154/59 (!) 137/59 (!) 122/57 (!) 116/51  Pulse: 69 70 75 73  Resp: 18 18 18    Temp: 97.8 F (36.6 C) 97.5 F (36.4 C) 97.4 F (36.3 C)   TempSrc: Oral Oral Oral   SpO2: 100% 98% 98%   Weight:   119 lb 8 oz (54.2 kg)   Height:        Intake/Output Summary (Last 24 hours) at 07/10/16 1126 Last data filed at 07/10/16 1031  Gross per 24 hour  Intake          1579.08 ml  Output             3300 ml  Net         -1720.92 ml   Filed Weights   07/08/16 1337 07/09/16 0454 07/10/16 0437  Weight: 121 lb (54.9 kg) 120 lb 11.2 oz (54.7 kg) 119 lb 8 oz (54.2 kg)    Physical Exam      GEN:  Thin, in no acute distress. In bed HEENT: Facial laceration sutured, marked ecchymosis surrounding eyes Neck: Supple, no JVD, carotid bruits, or masses. Cardiac: RRR, no murmurs, rubs, or gallops. No clubbing, cyanosis, edema.  Radials/DP/PT 2+ and equal bilaterally.  Respiratory:  Respirations regular and unlabored, clear to auscultation bilaterally. GI: Soft, nontender, nondistended, BS + x 4. MS: no deformity or atrophy. Skin: warm and dry, no rash. Neuro:  Strength and sensation are intact. Psych: AAOx3.  Normal affect.  Labs    CBC  Recent Labs  07/07/16 1914  07/09/16 0427 07/09/16 2334  WBC 14.0*  < > 6.6 6.8  NEUTROABS 10.4*  --   --   --   HGB 14.6  < > 12.7 12.3  HCT 41.2  < > 36.6 35.8*  MCV 87.3  < > 88.0 88.2  PLT 292  < > 229 208  < > = values in this interval not displayed. Basic Metabolic Panel  Recent Labs  07/08/16 0140 07/09/16 0427  NA 131* 133*  K 3.9 3.9  CL 100* 101  CO2 19* 25  GLUCOSE 87 98  BUN 5* 5*  CREATININE 0.60 0.65  CALCIUM 8.8* 8.8*   Liver Function Tests  Recent Labs  07/07/16 1914  AST 31  ALT 27  ALKPHOS 58  BILITOT 0.8  PROT 6.4*  ALBUMIN 3.8   No results for input(s): LIPASE, AMYLASE in the last 72 hours. Cardiac Enzymes  Recent Labs  07/08/16 0140 07/08/16 0645 07/08/16 1300  TROPONINI 0.17* 0.27* 0.20*     Recent Labs  07/07/16 1914  CHOL 174  HDL 87  LDLCALC 70  TRIG 87  CHOLHDL 2.0   Thyroid Function Tests  Recent Labs  07/08/16 0617  TSH 2.454    Telemetry    NSR, no adverse arrhythmias - Personally Reviewed  ECG    07/08/16-biphasic T wave noted in V2-ischemic pattern - Personally Reviewed  Radiology    No results found.  Cardiac Studies   Cardiac catheterization 07/08/16: Conclusions: 1.  Significant 2-vessel coronary artery disease including:  Heavily calcified chronic total occlusion of proximal/mid LAD at the origin of 1st septal branch with left-to-left and  right-to-left collaterals.  Calcified 80% stenosis of proximal LCx/OM1 with TIMI-3 flow. 2.  Severely reduced LV contraction (LVEF ~30%) with anterior, apical, and apical inferior dyskinesis. 3.  Low normal left ventricular filling pressure (LVEDP 4 mmHg).  Recommendations: 1.  Medical management of NSTEMI, though I suspect mild troponin elevation most likely reflects supply-demand mismatch (type 2 MI) rather than acute plaque rupture.  I would treat with heparin infusion for 48-72 hours and continue low-dose aspirin. 2.  Recommend cardiac MRI to assess viability of LAD territory.  If there is viability, revascularization of LAD and LCx/OM1 (by PCI or CABG) should be considered.  If the LAD territory is non-viable, PCI to LCx/OM1 should be considered. 3.  Continue secondary prevention with ASA and statin.  Will start carvediolol 3.125 mg BID for CAD and cardiomyopathy. 4.  Hydrate gently overnight, given low LVEDP with reduced LV contraction. 5.  Given unclear circumstances surrounding the patient's fall yesterday, electrophysiology consultation may be beneficial, as tachyarrhythmia due to prior MI/cardiomyopathy could have precipitated yesterday's event (though alcohol intoxication confounds the picture).  Pam Bush, MD  Patient Profile     65 year old with mechanical fall, facial laceration, abnormal EKG with troponin elevation of 0.27, likely type II non-ST elevation myocardial infarction who underwent cardiac catheterization demonstrating a chronically occluded LAD. Awaiting cardiac MRI for viability.  Assessment & Plan    Myocardial infarction type II-supply demand mismatch likely from chronically occluded LAD  - Continue IV heparin today and stop tomorrow (Monday). Hgb stable.   - Aspirin 81  - Carvedilol 3.125 twice a day has been started given ejection fraction approximately 35%.  - Given what appears to be chronically occluded LAD, it was suggested that a cardiac MRI be  performed to undergo viability study. If there is no obvious viability along the anterior wall in the LAD distribution, PCI to the obtuse marginal would be recommended. If there is viability along the anterior wall, 2 vessel bypass would be suggested.  - I wrote an order for cardiac MRI  Ischemic cardiomyopathy  - Carvedilol started.  - Low LVEDP.  - Was gently hydrated.  Fall with scalp laceration  - Patient adamant this was not syncope but mechanical.  - Alcohol intoxication, level 221 noted on admission.  Signed, Candee Furbish, MD  07/10/2016, 11:26 AM

## 2016-07-10 NOTE — Progress Notes (Signed)
PROGRESS NOTE                                                                                                                                                                                                             Patient Demographics:    Pam [name], is a 65 y.o. female, DOB - 06/18/1951, [id number]  Admit date - 07/07/2016   Admitting Physician Rise Patience, MD  Outpatient Primary MD for the patient is Pcp Not In System  LOS - 1  Chief Complaint  Patient presents with  . Fall  . Facial Injury       Brief Narrative    Pam [name] is a 65 y.o. female with history of hyperlipidemia hypothyroidism was brought to the ER after patient had a fall. Patient states she was planning to dispose the trash when she suddenly slipped and fell. She fell onto her face. Denies losing consciousness. Denies any chest pain shortness of breath palpitations. EMS was called and EKG was showing ST elevation in the anterior leads and on-call cardiologist was consulted. Initial troponin was negative and patient did not have chest pain. Cardiologist felt that patient at this time did not meet the criteria for ST elevation MI and advised observation.  CT head neck and maxillofacial was done and showed minimally displaced nasal bone fracture and frontal scalp hematoma. Patient had suturing done of the laceration on the face and the left hand. On call ENT surgeon Dr. Redmond Baseman was consulted by the ER physician and Dr. Redmond Baseman advised outpatient follow-up.   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 Cardiology following further workup per cardiology.    Subjective:    Pam [name] today has, No headache, No chest pain, No abdominal pain - No Nausea, No new weakness tingling or numbness, No Cough - SOB.     Assessment  & Plan :     1. Mechaniical fall with  right-sided scalp hematoma, depressed nasal bone fracture, left hand laceration. She was treated in the ER lacerations were sutured, cleared from trauma standpoint by ER, ER physician called Dr. Redmond Baseman ENT who requested outpatient follow-up. She does have substantial facial bruising. PT eval, she is adamant  that this was a mechanical trip fall and she never syncopized.   2.  Incidental finding of nonspecific EKG changes ?? STEMI ( defer to Cards) . Mildly elevated troponin. This was not in ACS pattern, no chest pain now, code STEMI was discontinued by cardiology on the day of admission subsequently she underwent left heart cath per cardiology on 07/08/2016 showing multivessel disease, she is currently on heparin drip along with aspirin, Coreg and statin, cardiology is planning for a cardiac MRI on Monday to evaluate viability thereafter they will decide on PCI versus CABG.   3. HX of smoking and occasional alcohol intake. Patient states that she drinks 1-2 times a week, counseled to quit both. Monitor on CIWA protocol.   4. Hypothyroidism. On and thyroid continue.   5. Dyslipidemia. On statin.   6. 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    Family Communication  :  Husband  Code Status :  Full  Diet : Diet Heart Room service appropriate? Yes; Fluid consistency: Thin   Disposition Plan  :  Stay inpatient for further cardiac workup  Consults  :  ENT by EDP, Cards  Procedures  :    CT head and neck -  depressed nasal fracture  Left heart cath on 07/08/2016 showing multivessel disease  Due for cardiac MRI on 07/11/2016  DVT Prophylaxis  :  SCDs/Heparin  Lab Results  Component Value Date   PLT 208 07/09/2016    Inpatient Medications  Scheduled Meds: . aspirin  81 mg Oral Daily  . atorvastatin  40 mg Oral Daily  . carvedilol  3.125 mg Oral BID WC  . folic acid  1 mg Oral Daily  . levothyroxine   25 mcg Oral QAC breakfast  . LORazepam  0-4 mg Oral Q12H  . multivitamin with minerals  1 tablet Oral Daily  . sodium chloride flush  3 mL Intravenous Q12H  . thiamine  100 mg Oral Daily   Continuous Infusions: . heparin 1,050 Units/hr (07/10/16 0735)   PRN Meds:.sodium chloride, acetaminophen **OR** acetaminophen, LORazepam **OR** LORazepam, ondansetron **OR** ondansetron (ZOFRAN) IV, oxyCODONE, sodium chloride flush  Antibiotics  :    Anti-infectives    None         Objective:   Vitals:   07/09/16 1230 07/09/16 1947 07/10/16 0437 07/10/16 0805  BP: (!) 154/59 (!) 137/59 (!) 122/57 (!) 116/51  Pulse: 69 70 75 73  Resp: 18 18 18    Temp: 97.8 F (36.6 C) 97.5 F (36.4 C) 97.4 F (36.3 C)   TempSrc: Oral Oral Oral   SpO2: 100% 98% 98%   Weight:   54.2 kg (119 lb 8 oz)   Height:        Wt Readings from Last 3 Encounters:  07/10/16 54.2 kg (119 lb 8 oz)  04/13/14 54.4 kg (120 lb)     Intake/Output Summary (Last 24 hours) at 07/10/16 0859 Last data filed at 07/10/16 0730  Gross per 24 hour  Intake          1645.66 ml  Output             3700 ml  Net         -2054.34 ml     Physical Exam  Awake Alert, Oriented X 3, No new F.N deficits, Normal affect Nasal fracture, right-sided scalp laceration and a bandage, right periorbital hematoma,PERRAL Supple Neck,No JVD, No cervical lymphadenopathy appriciated.  Symmetrical Chest wall movement, Good air movement bilaterally, CTAB RRR,No Gallops,Rubs or new Murmurs, No Parasternal Heave +  ve B.Sounds, Abd Soft, No tenderness, No organomegaly appriciated, No rebound - guarding or rigidity. No Cyanosis, Clubbing or edema, No new Rash or bruise     Data Review:    CBC  Recent Labs Lab 07/07/16 1914 07/08/16 0140 07/09/16 0427 07/09/16 2334  WBC 14.0* 12.5* 6.6 6.8  HGB 14.6 14.0 12.7 12.3  HCT 41.2 39.7 36.6 35.8*  PLT 292 273 229 208  MCV 87.3 85.9 88.0 88.2  MCH 30.9 30.3 30.5 30.3  MCHC 35.4 35.3 34.7 34.4   RDW 14.6 14.6 14.9 15.1  LYMPHSABS 2.8  --   --   --   MONOABS 0.7  --   --   --   EOSABS 0.1  --   --   --   BASOSABS 0.1  --   --   --     Chemistries   Recent Labs Lab 07/07/16 1914 07/08/16 0140 07/09/16 0427  NA 125* 131* 133*  K 4.5 3.9 3.9  CL 94* 100* 101  CO2 21* 19* 25  GLUCOSE 91 87 98  BUN 7 5* 5*  CREATININE 0.68 0.60 0.65  CALCIUM 8.9 8.8* 8.8*  AST 31  --   --   ALT 27  --   --   ALKPHOS 58  --   --   BILITOT 0.8  --   --    ------------------------------------------------------------------------------------------------------------------  Recent Labs  07/07/16 1914  CHOL 174  HDL 87  LDLCALC 70  TRIG 87  CHOLHDL 2.0    No results found for: HGBA1C ------------------------------------------------------------------------------------------------------------------  Recent Labs  07/08/16 0617  TSH 2.454   ------------------------------------------------------------------------------------------------------------------ No results for input(s): VITAMINB12, FOLATE, FERRITIN, TIBC, IRON, RETICCTPCT in the last 72 hours.  Coagulation profile  Recent Labs Lab 07/07/16 1914  INR 1.04    No results for input(s): DDIMER in the last 72 hours.  Cardiac Enzymes  Recent Labs Lab 07/08/16 0140 07/08/16 0645 07/08/16 1300  TROPONINI 0.17* 0.27* 0.20*   ------------------------------------------------------------------------------------------------------------------ No results found for: BNP  Micro Results No results found for this or any previous visit (from the past 240 hour(s)).  Radiology Reports Ct Head Wo Contrast  Result Date: 07/07/2016 CLINICAL DATA:  Patient found down on the ground after alcohol consumption. EXAM: CT HEAD WITHOUT CONTRAST CT MAXILLOFACIAL WITHOUT CONTRAST CT CERVICAL SPINE WITHOUT CONTRAST TECHNIQUE: Multidetector CT imaging of the head, cervical spine, and maxillofacial structures were performed using the standard  protocol without intravenous contrast. Multiplanar CT image reconstructions of the cervical spine and maxillofacial structures were also generated. COMPARISON:  None. FINDINGS: CT HEAD FINDINGS Brain: No evidence of acute infarction, hemorrhage, hydrocephalus, extra-axial collection or mass lesion/mass effect. Mild generalized atrophy. Vascular: Atherosclerosis of skullbase vasculature without hyperdense vessel or abnormal calcification. Skull: Right frontal scalp hematoma without calvarial fracture. Other: None. CT MAXILLOFACIAL FINDINGS Osseous: Nasal bone fracture, mildly displaced. There is mild rightward nasal septal swelling with possible fracture posteriorly, no nasal septal hematoma. No additional facial bone fracture. Zygomatic arches are intact. No mandibular fracture, temporomandibular joints are congruent. Orbits: Right supraorbital soft tissue edema. No orbital fracture. Both globes are intact. Sinuses: Clear. Soft tissues: Soft tissue edema about the chin. CT CERVICAL SPINE FINDINGS Alignment: Straightening of normal lordosis. No listhesis, jumped or perched facets. Skull base and vertebrae: No acute fracture. No primary bone lesion or focal pathologic process. Soft tissues and spinal canal: No prevertebral fluid or swelling. No visible canal hematoma. Disc levels: Multilevel disc space narrowing and endplate spurring is most prominent  at C5-C6 and C6-C7. There is multilevel facet arthropathy. Upper chest: Emphysema, advanced. Azygos fissure is incidentally noted. Other: Carotid atherosclerosis. IMPRESSION: 1. No acute intracranial abnormality. Right frontal scalp hematoma without skull fracture. 2. Minimally depressed nasal bone fracture. No additional facial bone fracture. 3. Degenerative change throughout cervical spine. No acute fracture or subluxation. 4. Emphysema noted at the lung apices. 5. Carotid and intracranial atherosclerosis. Electronically Signed   By: Jeb Levering M.D.   On:  07/07/2016 20:38   Ct Cervical Spine Wo Contrast  Result Date: 07/07/2016 CLINICAL DATA:  Patient found down on the ground after alcohol consumption. EXAM: CT HEAD WITHOUT CONTRAST CT MAXILLOFACIAL WITHOUT CONTRAST CT CERVICAL SPINE WITHOUT CONTRAST TECHNIQUE: Multidetector CT imaging of the head, cervical spine, and maxillofacial structures were performed using the standard protocol without intravenous contrast. Multiplanar CT image reconstructions of the cervical spine and maxillofacial structures were also generated. COMPARISON:  None. FINDINGS: CT HEAD FINDINGS Brain: No evidence of acute infarction, hemorrhage, hydrocephalus, extra-axial collection or mass lesion/mass effect. Mild generalized atrophy. Vascular: Atherosclerosis of skullbase vasculature without hyperdense vessel or abnormal calcification. Skull: Right frontal scalp hematoma without calvarial fracture. Other: None. CT MAXILLOFACIAL FINDINGS Osseous: Nasal bone fracture, mildly displaced. There is mild rightward nasal septal swelling with possible fracture posteriorly, no nasal septal hematoma. No additional facial bone fracture. Zygomatic arches are intact. No mandibular fracture, temporomandibular joints are congruent. Orbits: Right supraorbital soft tissue edema. No orbital fracture. Both globes are intact. Sinuses: Clear. Soft tissues: Soft tissue edema about the chin. CT CERVICAL SPINE FINDINGS Alignment: Straightening of normal lordosis. No listhesis, jumped or perched facets. Skull base and vertebrae: No acute fracture. No primary bone lesion or focal pathologic process. Soft tissues and spinal canal: No prevertebral fluid or swelling. No visible canal hematoma. Disc levels: Multilevel disc space narrowing and endplate spurring is most prominent at C5-C6 and C6-C7. There is multilevel facet arthropathy. Upper chest: Emphysema, advanced. Azygos fissure is incidentally noted. Other: Carotid atherosclerosis. IMPRESSION: 1. No acute  intracranial abnormality. Right frontal scalp hematoma without skull fracture. 2. Minimally depressed nasal bone fracture. No additional facial bone fracture. 3. Degenerative change throughout cervical spine. No acute fracture or subluxation. 4. Emphysema noted at the lung apices. 5. Carotid and intracranial atherosclerosis. Electronically Signed   By: Jeb Levering M.D.   On: 07/07/2016 20:38   Ct Maxillofacial Wo Contrast  Result Date: 07/07/2016 CLINICAL DATA:  Patient found down on the ground after alcohol consumption. EXAM: CT HEAD WITHOUT CONTRAST CT MAXILLOFACIAL WITHOUT CONTRAST CT CERVICAL SPINE WITHOUT CONTRAST TECHNIQUE: Multidetector CT imaging of the head, cervical spine, and maxillofacial structures were performed using the standard protocol without intravenous contrast. Multiplanar CT image reconstructions of the cervical spine and maxillofacial structures were also generated. COMPARISON:  None. FINDINGS: CT HEAD FINDINGS Brain: No evidence of acute infarction, hemorrhage, hydrocephalus, extra-axial collection or mass lesion/mass effect. Mild generalized atrophy. Vascular: Atherosclerosis of skullbase vasculature without hyperdense vessel or abnormal calcification. Skull: Right frontal scalp hematoma without calvarial fracture. Other: None. CT MAXILLOFACIAL FINDINGS Osseous: Nasal bone fracture, mildly displaced. There is mild rightward nasal septal swelling with possible fracture posteriorly, no nasal septal hematoma. No additional facial bone fracture. Zygomatic arches are intact. No mandibular fracture, temporomandibular joints are congruent. Orbits: Right supraorbital soft tissue edema. No orbital fracture. Both globes are intact. Sinuses: Clear. Soft tissues: Soft tissue edema about the chin. CT CERVICAL SPINE FINDINGS Alignment: Straightening of normal lordosis. No listhesis, jumped or perched facets. Skull base and vertebrae:  No acute fracture. No primary bone lesion or focal pathologic  process. Soft tissues and spinal canal: No prevertebral fluid or swelling. No visible canal hematoma. Disc levels: Multilevel disc space narrowing and endplate spurring is most prominent at C5-C6 and C6-C7. There is multilevel facet arthropathy. Upper chest: Emphysema, advanced. Azygos fissure is incidentally noted. Other: Carotid atherosclerosis. IMPRESSION: 1. No acute intracranial abnormality. Right frontal scalp hematoma without skull fracture. 2. Minimally depressed nasal bone fracture. No additional facial bone fracture. 3. Degenerative change throughout cervical spine. No acute fracture or subluxation. 4. Emphysema noted at the lung apices. 5. Carotid and intracranial atherosclerosis. Electronically Signed   By: Jeb Levering M.D.   On: 07/07/2016 20:38    Time Spent in minutes  30   Austen Oyster K M.D on 07/10/2016 at 8:59 AM  Between 7am to 7pm - Pager - [phone]  After 7pm go to [email or web address] - password [facility]  [facility] -  Office  [phone]

## 2016-07-11 ENCOUNTER — Inpatient Hospital Stay (HOSPITAL_COMMUNITY): Payer: Medicare Other

## 2016-07-11 ENCOUNTER — Encounter (HOSPITAL_COMMUNITY): Payer: Self-pay | Admitting: Internal Medicine

## 2016-07-11 DIAGNOSIS — I255 Ischemic cardiomyopathy: Secondary | ICD-10-CM

## 2016-07-11 DIAGNOSIS — E871 Hypo-osmolality and hyponatremia: Secondary | ICD-10-CM

## 2016-07-11 LAB — CBC
HCT: 38.9 % (ref 36.0–46.0)
HEMOGLOBIN: 13 g/dL (ref 12.0–15.0)
MCH: 30.1 pg (ref 26.0–34.0)
MCHC: 33.4 g/dL (ref 30.0–36.0)
MCV: 90 fL (ref 78.0–100.0)
Platelets: 236 10*3/uL (ref 150–400)
RBC: 4.32 MIL/uL (ref 3.87–5.11)
RDW: 15.2 % (ref 11.5–15.5)
WBC: 8.9 10*3/uL (ref 4.0–10.5)

## 2016-07-11 LAB — BASIC METABOLIC PANEL
Anion gap: 11 (ref 5–15)
BUN: 8 mg/dL (ref 6–20)
CHLORIDE: 102 mmol/L (ref 101–111)
CO2: 22 mmol/L (ref 22–32)
CREATININE: 0.76 mg/dL (ref 0.44–1.00)
Calcium: 9.3 mg/dL (ref 8.9–10.3)
GFR calc Af Amer: >60 mL/min (ref >60)
GFR calc non Af Amer: >60 mL/min (ref >60)
GLUCOSE: 104 mg/dL — AB (ref 65–99)
POTASSIUM: 4.4 mmol/L (ref 3.5–5.1)
SODIUM: 135 mmol/L (ref 135–145)

## 2016-07-11 LAB — HEPARIN LEVEL (UNFRACTIONATED): Heparin Unfractionated: 0.76 IU/mL — ABNORMAL HIGH (ref 0.30–0.70)

## 2016-07-11 MED ORDER — GADOBENATE DIMEGLUMINE 529 MG/ML IV SOLN
20.0000 mL | Freq: Once | INTRAVENOUS | Status: AC
  Administered 2016-07-11: 20 mL via INTRAVENOUS

## 2016-07-11 MED ORDER — ENOXAPARIN SODIUM 40 MG/0.4ML ~~LOC~~ SOLN
40.0000 mg | SUBCUTANEOUS | Status: DC
  Administered 2016-07-11: 40 mg via SUBCUTANEOUS
  Filled 2016-07-11: qty 0.4

## 2016-07-11 MED FILL — Heparin Sodium (Porcine) 2 Unit/ML in Sodium Chloride 0.9%: INTRAMUSCULAR | Qty: 1000 | Status: AC

## 2016-07-11 NOTE — Progress Notes (Signed)
Patient Name: Pam [name] Date of Encounter: 07/11/2016  Primary Cardiologist: Dr. Lorretta Harp Problem List     Principal Problem:   Fall Active Problems:   Abnormal EKG   HLD (hyperlipidemia)   Hypothyroidism   Facial laceration   Coronary artery disease involving native coronary artery of native heart without angina pectoris     Subjective   Suffered mechanical fall, facial laceration. Incidental finding of ST elevation anterior leads. Underwent heart catheterization which showed multivessel disease including chronically occluded LAD. Awaiting cardiac MRI to see if anterior wall was viable.  No current chest pain. No shortness of breath. Sitting up in bed. Just came back from MRI.  Inpatient Medications    Scheduled Meds: . aspirin  81 mg Oral Daily  . atorvastatin  40 mg Oral Daily  . carvedilol  3.125 mg Oral BID WC  . folic acid  1 mg Oral Daily  . levothyroxine  25 mcg Oral QAC breakfast  . LORazepam  0-4 mg Oral Q12H  . multivitamin with minerals  1 tablet Oral Daily  . sodium chloride flush  3 mL Intravenous Q12H  . thiamine  100 mg Oral Daily   Continuous Infusions:  PRN Meds: sodium chloride, acetaminophen **OR** acetaminophen, ondansetron **OR** ondansetron (ZOFRAN) IV, oxyCODONE, sodium chloride flush   Vital Signs    Vitals:   07/11/16 0000 07/11/16 0446 07/11/16 0544 07/11/16 0601  BP: 135/62 (!) 105/56 (!) 102/57 (!) 102/57  Pulse: 64 64 64 64  Resp:  18    Temp:  97.8 F (36.6 C)    TempSrc:  Oral    SpO2:  95%  95%  Weight:  122 lb 14.4 oz (55.7 kg)    Height:        Intake/Output Summary (Last 24 hours) at 07/11/16 1020 Last data filed at 07/11/16 0941  Gross per 24 hour  Intake          1862.01 ml  Output             4000 ml  Net         -2137.99 ml   Filed Weights   07/09/16 0454 07/10/16 0437 07/11/16 0446  Weight: 120 lb 11.2 oz (54.7 kg) 119 lb 8 oz (54.2 kg) 122 lb 14.4 oz (55.7 kg)    Physical Exam    GEN:  Thin, in  no acute distress. In bed HEENT: Facial laceration sutured, marked ecchymosis surrounding eyes Neck: Supple, no JVD, carotid bruits, or masses. Cardiac: RRR, no murmurs, rubs, or gallops. No clubbing, cyanosis, edema.  Radials/DP/PT 2+ and equal bilaterally.  Respiratory:  Respirations regular and unlabored, clear to auscultation bilaterally. GI: Soft, nontender, nondistended, BS + x 4. MS: no deformity or atrophy. Skin: bruising Neuro:  Strength and sensation are intact. Psych: AAOx3.  Normal affect.  Labs    CBC  Recent Labs  07/09/16 2334 07/11/16 0416  WBC 6.8 8.9  HGB 12.3 13.0  HCT 35.8* 38.9  MCV 88.2 90.0  PLT 208 [id number]   Basic Metabolic Panel  Recent Labs  07/09/16 0427 07/11/16 0416  NA 133* 135  K 3.9 4.4  CL 101 102  CO2 25 22  GLUCOSE 98 104*  BUN 5* 8  CREATININE 0.65 0.76  CALCIUM 8.8* 9.3   Liver Function Tests No results for input(s): AST, ALT, ALKPHOS, BILITOT, PROT, ALBUMIN in the last 72 hours. No results for input(s): LIPASE, AMYLASE in the last 72 hours. Cardiac Enzymes  Recent Labs  07/08/16 1300  TROPONINI 0.20*    No results for input(s): CHOL, HDL, LDLCALC, TRIG, CHOLHDL, LDLDIRECT in the last 72 hours. Thyroid Function Tests No results for input(s): TSH, T4TOTAL, T3FREE, THYROIDAB in the last 72 hours.  Invalid input(s): FREET3  Telemetry    NSR, no adverse arrhythmias - Personally Reviewed  ECG    07/08/16-biphasic T wave noted in V2-ischemic pattern - Personally Reviewed  Radiology    No results found.  Cardiac Studies   Cardiac catheterization 07/08/16: Conclusions: 1.  Significant 2-vessel coronary artery disease including:  Heavily calcified chronic total occlusion of proximal/mid LAD at the origin of 1st septal branch with left-to-left and right-to-left collaterals.  Calcified 80% stenosis of proximal LCx/OM1 with TIMI-3 flow. 2.  Severely reduced LV contraction (LVEF ~30%) with anterior, apical, and apical  inferior dyskinesis. 3.  Low normal left ventricular filling pressure (LVEDP 4 mmHg).  Recommendations: 1.  Medical management of NSTEMI, though I suspect mild troponin elevation most likely reflects supply-demand mismatch (type 2 MI) rather than acute plaque rupture.  I would treat with heparin infusion for 48-72 hours and continue low-dose aspirin. 2.  Recommend cardiac MRI to assess viability of LAD territory.  If there is viability, revascularization of LAD and LCx/OM1 (by PCI or CABG) should be considered.  If the LAD territory is non-viable, PCI to LCx/OM1 should be considered. 3.  Continue secondary prevention with ASA and statin.  Will start carvediolol 3.125 mg BID for CAD and cardiomyopathy. 4.  Hydrate gently overnight, given low LVEDP with reduced LV contraction. 5.  Given unclear circumstances surrounding the patient's fall yesterday, electrophysiology consultation may be beneficial, as tachyarrhythmia due to prior MI/cardiomyopathy could have precipitated yesterday's event (though alcohol intoxication confounds the picture).  [name], MD  Patient Profile     65 year old with mechanical fall, facial laceration, abnormal EKG with troponin elevation of 0.27, likely type II non-ST elevation myocardial infarction who underwent cardiac catheterization demonstrating a chronically occluded LAD. Awaiting cardiac MRI for viability of anterior wall.  Assessment & Plan    Myocardial infarction type II-supply demand mismatch likely from chronically occluded LAD  - Now off heparin. Hgb stable.   - Aspirin 81  - Carvedilol 3.125 twice a day has been started given ejection fraction approximately 35%.  - Given what appears to be chronically  cardiac MRI done to check anterior wall viability. If there is no obvious viability along the anterior wall in the LAD distribution, PCI to the obtuse marginal would be recommended. If there is viability along the anterior wall, 2 vessel bypass would  be considered vs. LAD CTO PCI attempt as well.  I personally reviewed the films with Dr. Saunders Revel.  Short, calcified lesion in the mid LAD at the origin of a septal.   Ischemic cardiomyopathy  - Carvedilol started.  BP too low to increase meds.  - Low LVEDP.  - Was gently hydrated.  Fall with scalp laceration  - Patient adamant this was not syncope but mechanical.  - Alcohol intoxication, level 221 noted on admission.  Signed, Larae Grooms, MD  07/11/2016, 10:20 AM

## 2016-07-11 NOTE — Progress Notes (Signed)
PRN for pain given, effective, after that patient with no complaints or concerns during 7pm - 7am shift.  Elania Crowl, RN

## 2016-07-11 NOTE — Progress Notes (Signed)
ANTICOAGULATION CONSULT NOTE - Follow Up Consult  Pharmacy Consult for Heparin  Indication: Multi-vessel disease awaiting definitive treatment  No Known Allergies  Patient Measurements: Height: 5\' 3"  (160 cm) Weight: 122 lb 14.4 oz (55.7 kg) IBW/kg (Calculated) : 52.4  Vital Signs: Temp: 97.8 F (36.6 C) (12/11 0446) Temp Source: Oral (12/11 0446) BP: 105/56 (12/11 0446) Pulse Rate: 64 (12/11 0446)  Labs:  Recent Labs  07/08/16 0645 07/08/16 1300  07/09/16 0427  07/09/16 2334 07/10/16 0738 07/11/16 0416  HGB  --   --   < > 12.7  --  12.3  --  13.0  HCT  --   --   --  36.6  --  35.8*  --  38.9  PLT  --   --   --  229  --  208  --  236  HEPARINUNFRC  --   --   --   --   < > 0.43 0.61 0.76*  CREATININE  --   --   --  0.65  --   --   --  0.76  TROPONINI 0.27* 0.20*  --   --   --   --   --   --   < > = values in this interval not displayed.  Estimated Creatinine Clearance: 58.8 mL/min (by C-G formula based on SCr of 0.76 mg/dL).   Assessment: On heparin s/p cath with multi-vessel disease, awaiting cardiac MRI to decide CABG vs PCI, heparin level elevated this AM, no issues per RN.   Goal of Therapy:  Heparin level 0.3-0.7 units/ml Monitor platelets by anticoagulation protocol: Yes   Plan:  -Dec heparin to 950 units/hr -1300 HL  Katelyn Broadnax 07/11/2016,5:16 AM

## 2016-07-11 NOTE — Progress Notes (Signed)
PROGRESS NOTE        PATIENT DETAILS Name: Pam [name] Age: 65 y.o. Sex: female Date of Birth: Nov 26, 1950 Admit Date: 07/07/2016 Admitting Physician Rise Patience, MD PCP:Pcp Not In System  Brief Narrative: Patient is a 65 y.o. female presented with mechanical fall, facial laceration subsequently found to have NSTEMI.Underwent cardiac catheterization demonstrating 2 vessel disease including a chronically occluded LAD. Awaiting cardiac MRI for viability  Subjective: Denies chest pain and shortness of breath.  Assessment/Plan: Mechanical fall with right-sided scalp hematoma, depressed nasal bone fracture, Nose and left hand laceration:She was treated in the ER lacerations were sutured, cleared from trauma standpoint by ER, ER physician called Dr. Redmond Baseman ENT who requested outpatient follow-up. Continues to have significant facial bruising. Note, per patient this was a mechanical fall, she denies ever losing consciousness. Evaluated by physical therapy, no follow-up needed at this point.  NSTEMI: Apparently initial EKG done by EMS showed transient ST elevation in precordial leads that resolved by the time patient presented to the ED. Urology was consulted, she underwent catheterization on 12/8 which showed significant two-vessel disease including a total but chronic occlusion of the LAD. Usually planning on a cardiac MRI today to assess for viability, following which PCI versus CABG will be considered. For now, continue with aspirin, statin, Coreg.  Chronic systolic heart failure (EF 35-40% by TTE on 07/08/16): Clinically compensated. Thought to have ischemic cardiomyopathy given cardiac cath findings.   Mild hyponatremia: Sodium is now normalized-very mild-plans are just monitor for now.  Hypothyroidism: Continue Synthroid.  Dyslipidemia: Continue statin  Tobacco abuse: Counseled  Alcohol intoxication/abuse: Alcohol level of 221 on presentation-continue  Ativan per protocol, no signs of withdrawal at present.  DVT Prophylaxis: Prophylactic Lovenox   Code Status: Full code   Family Communication: Spouse at bedside  Disposition Plan: Remain inpatient-home when cardiac w/u complete  Antimicrobial agents: None  Procedures: Left heart cath on 07/08/2016 showing multivessel disease  Echocardiogram 12/8: EF 123456, grade 1 diastolic dysfunction  CONSULTS:  cardiology  Time spent: 25- minutes-Greater than 50% of this time was spent in counseling, explanation of diagnosis, planning of further management, and coordination of care.  MEDICATIONS: Anti-infectives    None      Scheduled Meds: . aspirin  81 mg Oral Daily  . atorvastatin  40 mg Oral Daily  . carvedilol  3.125 mg Oral BID WC  . folic acid  1 mg Oral Daily  . levothyroxine  25 mcg Oral QAC breakfast  . LORazepam  0-4 mg Oral Q12H  . multivitamin with minerals  1 tablet Oral Daily  . sodium chloride flush  3 mL Intravenous Q12H  . thiamine  100 mg Oral Daily   Continuous Infusions: PRN Meds:.sodium chloride, acetaminophen **OR** acetaminophen, ondansetron **OR** ondansetron (ZOFRAN) IV, oxyCODONE, sodium chloride flush   PHYSICAL EXAM: Vital signs: Vitals:   07/11/16 0000 07/11/16 0446 07/11/16 0544 07/11/16 0601  BP: 135/62 (!) 105/56 (!) 102/57 (!) 102/57  Pulse: 64 64 64 64  Resp:  18    Temp:  97.8 F (36.6 C)    TempSrc:  Oral    SpO2:  95%  95%  Weight:  55.7 kg (122 lb 14.4 oz)    Height:       Filed Weights   07/09/16 0454 07/10/16 0437 07/11/16 0446  Weight: 54.7 kg (120 lb 11.2 oz)  54.2 kg (119 lb 8 oz) 55.7 kg (122 lb 14.4 oz)   Body mass index is 21.77 kg/m.   General appearance :Awake, alert, not in any distress. Speech Clear. Not toxic Looking.Marked ecchymosis surrounding eyes Eyes:, pupils equally reactive to light and accomodation,no scleral icterus.Pink conjunctiva HEENT: Atraumatic and Normocephalic Neck: supple, no JVD. No  cervical lymphadenopathy. No thyromegaly Resp:Good air entry bilaterally, no added sounds  CVS: S1 S2 regular, no murmurs.  GI: Bowel sounds present, Non tender and not distended with no gaurding, rigidity or rebound.No organomegaly Extremities: B/L Lower Ext shows no edema, both legs are warm to touch Neurology:  speech clear,Non focal, sensation is grossly intact. Psychiatric: Normal judgment and insight. Alert and oriented x 3. Normal mood. Musculoskeletal:gait appears to be normal.No digital cyanosis Skin:No Rash, warm and dry Wounds:N/A  I have personally reviewed following labs and imaging studies  LABORATORY DATA: CBC:  Recent Labs Lab 07/07/16 1914 07/08/16 0140 07/09/16 0427 07/09/16 2334 07/11/16 0416  WBC 14.0* 12.5* 6.6 6.8 8.9  NEUTROABS 10.4*  --   --   --   --   HGB 14.6 14.0 12.7 12.3 13.0  HCT 41.2 39.7 36.6 35.8* 38.9  MCV 87.3 85.9 88.0 88.2 90.0  PLT 292 273 229 208 [id number]    Basic Metabolic Panel:  Recent Labs Lab 07/07/16 1914 07/08/16 0140 07/09/16 0427 07/11/16 0416  NA 125* 131* 133* 135  K 4.5 3.9 3.9 4.4  CL 94* 100* 101 102  CO2 21* 19* 25 22  GLUCOSE 91 87 98 104*  BUN 7 5* 5* 8  CREATININE 0.68 0.60 0.65 0.76  CALCIUM 8.9 8.8* 8.8* 9.3    GFR: Estimated Creatinine Clearance: 58.8 mL/min (by C-G formula based on SCr of 0.76 mg/dL).  Liver Function Tests:  Recent Labs Lab 07/07/16 1914  AST 31  ALT 27  ALKPHOS 58  BILITOT 0.8  PROT 6.4*  ALBUMIN 3.8   No results for input(s): LIPASE, AMYLASE in the last 168 hours. No results for input(s): AMMONIA in the last 168 hours.  Coagulation Profile:  Recent Labs Lab 07/07/16 1914  INR 1.04    Cardiac Enzymes:  Recent Labs Lab 07/07/16 1914 07/07/16 2313 07/08/16 0140 07/08/16 0645 07/08/16 1300  TROPONINI <0.03 0.10* 0.17* 0.27* 0.20*    BNP (last 3 results) No results for input(s): PROBNP in the last 8760 hours.  HbA1C:  Recent Labs  07/09/16 0428  HGBA1C  5.3    CBG: No results for input(s): GLUCAP in the last 168 hours.  Lipid Profile: No results for input(s): CHOL, HDL, LDLCALC, TRIG, CHOLHDL, LDLDIRECT in the last 72 hours.  Thyroid Function Tests: No results for input(s): TSH, T4TOTAL, FREET4, T3FREE, THYROIDAB in the last 72 hours.  Anemia Panel: No results for input(s): VITAMINB12, FOLATE, FERRITIN, TIBC, IRON, RETICCTPCT in the last 72 hours.  Urine analysis: No results found for: COLORURINE, APPEARANCEUR, LABSPEC, PHURINE, GLUCOSEU, HGBUR, BILIRUBINUR, KETONESUR, PROTEINUR, UROBILINOGEN, NITRITE, LEUKOCYTESUR  Sepsis Labs: Lactic Acid, Venous No results found for: LATICACIDVEN  MICROBIOLOGY: No results found for this or any previous visit (from the past 240 hour(s)).  RADIOLOGY STUDIES/RESULTS: Ct Head Wo Contrast  Result Date: 07/07/2016 CLINICAL DATA:  Patient found down on the ground after alcohol consumption. EXAM: CT HEAD WITHOUT CONTRAST CT MAXILLOFACIAL WITHOUT CONTRAST CT CERVICAL SPINE WITHOUT CONTRAST TECHNIQUE: Multidetector CT imaging of the head, cervical spine, and maxillofacial structures were performed using the standard protocol without intravenous contrast. Multiplanar CT image reconstructions of the cervical  spine and maxillofacial structures were also generated. COMPARISON:  None. FINDINGS: CT HEAD FINDINGS Brain: No evidence of acute infarction, hemorrhage, hydrocephalus, extra-axial collection or mass lesion/mass effect. Mild generalized atrophy. Vascular: Atherosclerosis of skullbase vasculature without hyperdense vessel or abnormal calcification. Skull: Right frontal scalp hematoma without calvarial fracture. Other: None. CT MAXILLOFACIAL FINDINGS Osseous: Nasal bone fracture, mildly displaced. There is mild rightward nasal septal swelling with possible fracture posteriorly, no nasal septal hematoma. No additional facial bone fracture. Zygomatic arches are intact. No mandibular fracture, temporomandibular  joints are congruent. Orbits: Right supraorbital soft tissue edema. No orbital fracture. Both globes are intact. Sinuses: Clear. Soft tissues: Soft tissue edema about the chin. CT CERVICAL SPINE FINDINGS Alignment: Straightening of normal lordosis. No listhesis, jumped or perched facets. Skull base and vertebrae: No acute fracture. No primary bone lesion or focal pathologic process. Soft tissues and spinal canal: No prevertebral fluid or swelling. No visible canal hematoma. Disc levels: Multilevel disc space narrowing and endplate spurring is most prominent at C5-C6 and C6-C7. There is multilevel facet arthropathy. Upper chest: Emphysema, advanced. Azygos fissure is incidentally noted. Other: Carotid atherosclerosis. IMPRESSION: 1. No acute intracranial abnormality. Right frontal scalp hematoma without skull fracture. 2. Minimally depressed nasal bone fracture. No additional facial bone fracture. 3. Degenerative change throughout cervical spine. No acute fracture or subluxation. 4. Emphysema noted at the lung apices. 5. Carotid and intracranial atherosclerosis. Electronically Signed   By: Jeb Levering M.D.   On: 07/07/2016 20:38   Ct Cervical Spine Wo Contrast  Result Date: 07/07/2016 CLINICAL DATA:  Patient found down on the ground after alcohol consumption. EXAM: CT HEAD WITHOUT CONTRAST CT MAXILLOFACIAL WITHOUT CONTRAST CT CERVICAL SPINE WITHOUT CONTRAST TECHNIQUE: Multidetector CT imaging of the head, cervical spine, and maxillofacial structures were performed using the standard protocol without intravenous contrast. Multiplanar CT image reconstructions of the cervical spine and maxillofacial structures were also generated. COMPARISON:  None. FINDINGS: CT HEAD FINDINGS Brain: No evidence of acute infarction, hemorrhage, hydrocephalus, extra-axial collection or mass lesion/mass effect. Mild generalized atrophy. Vascular: Atherosclerosis of skullbase vasculature without hyperdense vessel or abnormal  calcification. Skull: Right frontal scalp hematoma without calvarial fracture. Other: None. CT MAXILLOFACIAL FINDINGS Osseous: Nasal bone fracture, mildly displaced. There is mild rightward nasal septal swelling with possible fracture posteriorly, no nasal septal hematoma. No additional facial bone fracture. Zygomatic arches are intact. No mandibular fracture, temporomandibular joints are congruent. Orbits: Right supraorbital soft tissue edema. No orbital fracture. Both globes are intact. Sinuses: Clear. Soft tissues: Soft tissue edema about the chin. CT CERVICAL SPINE FINDINGS Alignment: Straightening of normal lordosis. No listhesis, jumped or perched facets. Skull base and vertebrae: No acute fracture. No primary bone lesion or focal pathologic process. Soft tissues and spinal canal: No prevertebral fluid or swelling. No visible canal hematoma. Disc levels: Multilevel disc space narrowing and endplate spurring is most prominent at C5-C6 and C6-C7. There is multilevel facet arthropathy. Upper chest: Emphysema, advanced. Azygos fissure is incidentally noted. Other: Carotid atherosclerosis. IMPRESSION: 1. No acute intracranial abnormality. Right frontal scalp hematoma without skull fracture. 2. Minimally depressed nasal bone fracture. No additional facial bone fracture. 3. Degenerative change throughout cervical spine. No acute fracture or subluxation. 4. Emphysema noted at the lung apices. 5. Carotid and intracranial atherosclerosis. Electronically Signed   By: Jeb Levering M.D.   On: 07/07/2016 20:38   Ct Maxillofacial Wo Contrast  Result Date: 07/07/2016 CLINICAL DATA:  Patient found down on the ground after alcohol consumption. EXAM: CT HEAD WITHOUT CONTRAST  CT MAXILLOFACIAL WITHOUT CONTRAST CT CERVICAL SPINE WITHOUT CONTRAST TECHNIQUE: Multidetector CT imaging of the head, cervical spine, and maxillofacial structures were performed using the standard protocol without intravenous contrast. Multiplanar CT  image reconstructions of the cervical spine and maxillofacial structures were also generated. COMPARISON:  None. FINDINGS: CT HEAD FINDINGS Brain: No evidence of acute infarction, hemorrhage, hydrocephalus, extra-axial collection or mass lesion/mass effect. Mild generalized atrophy. Vascular: Atherosclerosis of skullbase vasculature without hyperdense vessel or abnormal calcification. Skull: Right frontal scalp hematoma without calvarial fracture. Other: None. CT MAXILLOFACIAL FINDINGS Osseous: Nasal bone fracture, mildly displaced. There is mild rightward nasal septal swelling with possible fracture posteriorly, no nasal septal hematoma. No additional facial bone fracture. Zygomatic arches are intact. No mandibular fracture, temporomandibular joints are congruent. Orbits: Right supraorbital soft tissue edema. No orbital fracture. Both globes are intact. Sinuses: Clear. Soft tissues: Soft tissue edema about the chin. CT CERVICAL SPINE FINDINGS Alignment: Straightening of normal lordosis. No listhesis, jumped or perched facets. Skull base and vertebrae: No acute fracture. No primary bone lesion or focal pathologic process. Soft tissues and spinal canal: No prevertebral fluid or swelling. No visible canal hematoma. Disc levels: Multilevel disc space narrowing and endplate spurring is most prominent at C5-C6 and C6-C7. There is multilevel facet arthropathy. Upper chest: Emphysema, advanced. Azygos fissure is incidentally noted. Other: Carotid atherosclerosis. IMPRESSION: 1. No acute intracranial abnormality. Right frontal scalp hematoma without skull fracture. 2. Minimally depressed nasal bone fracture. No additional facial bone fracture. 3. Degenerative change throughout cervical spine. No acute fracture or subluxation. 4. Emphysema noted at the lung apices. 5. Carotid and intracranial atherosclerosis. Electronically Signed   By: Jeb Levering M.D.   On: 07/07/2016 20:38     LOS: 2 days   Oren Binet,  MD  [facility] Pager:[phone]  If 7PM-7AM, please contact night-coverage [email or web address] Password [email or web address] 07/11/2016, 10:19 AM

## 2016-07-11 NOTE — Progress Notes (Signed)
Physical Therapy Treatment Patient Details Name: Pam [name] MRN: [id number] DOB: May 18, 1951 Today's Date: 07/11/2016    History of Present Illness Pt is a 65 y/o female admitted secondary to falling at home. Pt had been drinking alcohol prior to fall and attempted to take the trash out and fell on her face. Head CT negative. Incidental findings of ST elevation. PMH including but not limited to hypothyroidism and hyperlipidemia.    PT Comments    Patient seen for mobility progression. Ambulated increased distance today with very modest instability, no physical assist required. VSS throughout session, Saturations >95% throughout but patient did endorse some fatigue.   OF NOTE: patient spouse present during session. Very close guard by husband at all times while therapist trying to work with patient. Nsg made aware.  Follow Up Recommendations  No PT follow up;Supervision - Intermittent     Equipment Recommendations  None recommended by PT    Recommendations for Other Services       Precautions / Restrictions Precautions Precautions: Fall Restrictions Weight Bearing Restrictions: No    Mobility  Bed Mobility Overal bed mobility: Needs Assistance Bed Mobility: Sit to Supine       Sit to supine: Supervision   General bed mobility comments: supervision for safety and positioning  Transfers Overall transfer level: Needs assistance Equipment used: None Transfers: Sit to/from Stand Sit to Stand: Supervision         General transfer comment: pt required increased time, min guard for safety  Ambulation/Gait Ambulation/Gait assistance: Min guard Ambulation Distance (Feet): 310 Feet Assistive device: None Gait Pattern/deviations: Step-through pattern;Decreased stride length Gait velocity: decreased Gait velocity interpretation: Below normal speed for age/gender General Gait Details: modest instability and fatigue with increased activity   Stairs             Wheelchair Mobility    Modified Rankin (Stroke Patients Only)       Balance Overall balance assessment: Needs assistance Sitting-balance support: Feet supported;No upper extremity supported Sitting balance-Leahy Scale: Good     Standing balance support: During functional activity;No upper extremity supported Standing balance-Leahy Scale: Fair                      Cognition Arousal/Alertness: Awake/alert Behavior During Therapy: WFL for tasks assessed/performed Overall Cognitive Status: Within Functional Limits for tasks assessed                      Exercises      General Comments General comments (skin integrity, edema, etc.): brusing and swelling noted over bilateral eyes, laceration over forehead      Pertinent Vitals/Pain Pain Assessment: Faces Faces Pain Scale: Hurts little more Pain Location: forehead Pain Descriptors / Indicators: Headache Pain Intervention(s): Monitored during session    Home Living                      Prior Function            PT Goals (current goals can now be found in the care plan section) Acute Rehab PT Goals Patient Stated Goal: return home PT Goal Formulation: With patient/family Time For Goal Achievement: 07/23/16 Potential to Achieve Goals: Good Progress towards PT goals: Progressing toward goals    Frequency    Min 3X/week      PT Plan Current plan remains appropriate    Co-evaluation             End of Session Equipment Utilized  During Treatment: Gait belt Activity Tolerance: Patient tolerated treatment well Patient left: in bed;with call bell/phone within reach;with family/visitor present     Time: [id number] PT Time Calculation (min) (ACUTE ONLY): 18 min  Charges:  $Gait Training: 8-22 mins                    G Codes:      Duncan Dull 2016-08-09, 5:35 PM Alben Deeds, [place] DPT  [phone]

## 2016-07-12 DIAGNOSIS — R748 Abnormal levels of other serum enzymes: Secondary | ICD-10-CM

## 2016-07-12 MED ORDER — LEVOTHYROXINE SODIUM 25 MCG PO TABS: 25.0000 ug | ORAL_TABLET | Freq: Every day | ORAL | 0 refills | Status: DC

## 2016-07-12 MED ORDER — ASPIRIN 81 MG PO CHEW: 81.0000 mg | CHEWABLE_TABLET | Freq: Every day | ORAL | 0 refills | Status: DC

## 2016-07-12 MED ORDER — ATORVASTATIN CALCIUM 40 MG PO TABS: 40.0000 mg | ORAL_TABLET | Freq: Every day | ORAL | 0 refills | Status: DC

## 2016-07-12 MED ORDER — CARVEDILOL 3.125 MG PO TABS: 3.1250 mg | ORAL_TABLET | Freq: Two times a day (BID) | ORAL | 0 refills | Status: DC

## 2016-07-12 NOTE — Progress Notes (Signed)
Patient Name: Pam [name] Date of Encounter: 07/12/2016  Primary Cardiologist: Dr. Lorretta Harp Problem List     Principal Problem:   Fall Active Problems:   Abnormal EKG   HLD (hyperlipidemia)   Hypothyroidism   Facial laceration   Coronary artery disease involving native coronary artery of native heart without angina pectoris   Hyponatremia     Subjective   Suffered mechanical fall, facial laceration. Incidental finding of ST elevation anterior leads. Underwent heart catheterization which showed multivessel disease including chronically occluded LAD. Cardiac MRI shows that anterior wall is not viable.  No current chest pain. No shortness of breath. Sitting up in bed.   Inpatient Medications    Scheduled Meds: . aspirin  81 mg Oral Daily  . atorvastatin  40 mg Oral Daily  . carvedilol  3.125 mg Oral BID WC  . enoxaparin (LOVENOX) injection  40 mg Subcutaneous Q24H  . folic acid  1 mg Oral Daily  . levothyroxine  25 mcg Oral QAC breakfast  . multivitamin with minerals  1 tablet Oral Daily  . sodium chloride flush  3 mL Intravenous Q12H  . thiamine  100 mg Oral Daily   Continuous Infusions:  PRN Meds: sodium chloride, acetaminophen **OR** acetaminophen, ondansetron **OR** ondansetron (ZOFRAN) IV, oxyCODONE, sodium chloride flush   Vital Signs    Vitals:   07/11/16 0601 07/11/16 1200 07/11/16 2045 07/12/16 0423  BP: (!) 102/57 (!) 108/48 (!) 114/54 (!) 121/53  Pulse: 64 66 65 68  Resp:  18 18 16   Temp:  97.8 F (36.6 C) 97.8 F (36.6 C) 97.5 F (36.4 C)  TempSrc:  Oral Oral Oral  SpO2: 95% 98% 98% 96%  Weight:    121 lb 11.2 oz (55.2 kg)  Height:        Intake/Output Summary (Last 24 hours) at 07/12/16 0745 Last data filed at 07/12/16 0424  Gross per 24 hour  Intake              603 ml  Output             4100 ml  Net            -3497 ml   Filed Weights   07/10/16 0437 07/11/16 0446 07/12/16 0423  Weight: 119 lb 8 oz (54.2 kg) 122 lb 14.4 oz  (55.7 kg) 121 lb 11.2 oz (55.2 kg)    Physical Exam    GEN:  Thin, in no acute distress. In bed HEENT: Facial laceration sutured, marked ecchymosis surrounding eyes Neck: Supple, no JVD, carotid bruits, or masses. Cardiac: RRR, no murmurs, rubs, or gallops. No clubbing, cyanosis, edema.  Radials/DP/PT 2+ and equal bilaterally.  Respiratory:  Respirations regular and unlabored, clear to auscultation bilaterally. GI: Soft, nontender, nondistended, BS + x 4. MS: no deformity or atrophy. Skin: bruising Neuro:  Strength and sensation are intact. Psych: AAOx3.  Normal affect.  Labs    CBC  Recent Labs  07/09/16 2334 07/11/16 0416  WBC 6.8 8.9  HGB 12.3 13.0  HCT 35.8* 38.9  MCV 88.2 90.0  PLT 208 [id number]   Basic Metabolic Panel  Recent Labs  07/11/16 0416  NA 135  K 4.4  CL 102  CO2 22  GLUCOSE 104*  BUN 8  CREATININE 0.76  CALCIUM 9.3   Liver Function Tests No results for input(s): AST, ALT, ALKPHOS, BILITOT, PROT, ALBUMIN in the last 72 hours. No results for input(s): LIPASE, AMYLASE in the last 72 hours. Cardiac  Enzymes No results for input(s): CKTOTAL, CKMB, CKMBINDEX, TROPONINI in the last 72 hours.  No results for input(s): CHOL, HDL, LDLCALC, TRIG, CHOLHDL, LDLDIRECT in the last 72 hours. Thyroid Function Tests No results for input(s): TSH, T4TOTAL, T3FREE, THYROIDAB in the last 72 hours.  Invalid input(s): FREET3  Telemetry    NSR, no adverse arrhythmias - Personally Reviewed  ECG    07/08/16-biphasic T wave noted in V2-ischemic pattern - Personally Reviewed  Radiology    Mr Cardiac Morphology W Wo Contrast  Result Date: 07/11/2016 CLINICAL DATA:  65 year old female with ischemic cardiomyopathy. Evaluate for viability. EXAM: CARDIAC MRI TECHNIQUE: The patient was scanned on a 1.5 Tesla GE magnet. A dedicated cardiac coil was used. Functional imaging was done using Fiesta sequences. 2,3, and 4 chamber views were done to assess for RWMA's. Modified  Simpson's rule using a short axis stack was used to calculate an ejection fraction on a dedicated work Conservation officer, nature. The patient received 24 cc of Multihance. After 10 minutes inversion recovery sequences were used to assess for infiltration and scar tissue. CONTRAST:  24 cc  of Multihance FINDINGS: 1. Normal size and thickness of the left ventricle. Systolic function is moderately decreased (LVEF = 38%). 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 There is akinesis of the apical lateral, mid anterior, anteroseptal walls. There is late gadolinium enhancement in the following segments: True apex:  75-100% (dismal chance of recovery if revascularized) Apical anterior: 75-100% (dismal chance of recovery if revascularized) Apical septal: 75-100% (dismal chance of recovery if revascularized) Apical lateral: 50-75% (poor chance of recovery if revascularized) Mid anterior: 75-100% (dismal chance of recovery if revascularized) Mid anteroseptal: 50-75% (poor chance of recovery if revascularized) LVEDD:  52 mm LVESD:  40 mm LVEDV:  156 ml LVESV:  96 ml SV:  60 ml Myocardial mass:  122 g 2. Normal right ventricular size, thickness and systolic function (RVEF = 54%) with no regional wall motion abnormalities. 3.  Normal biatrial size. 4.  Mild mitral and mild to moderate tricuspid regurgitation. 5. Normal size of the aortic root, ascending aorta and pulmonary artery. 6.  Normal pericardium, trivial pericardial effusion. IMPRESSION: 1. Normal size and thickness of the left ventricle. Systolic function is moderately decreased (LVEF = 38%). 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 There is akinesis of the apical lateral, mid anterior, anteroseptal walls. The LAD territory is non-viable, RCA and LCX territory is viable. 2. Normal right ventricular size, thickness and systolic function (RVEF = 54%) with no regional wall  motion abnormalities. 3.  Normal biatrial size. 4.  Mild mitral and mild to moderate tricuspid regurgitation. 5. Normal size of the aortic root, ascending aorta and pulmonary artery. 6.  Normal pericardium, trivial pericardial effusion. Ena Dawley Electronically Signed   By: Ena Dawley   On: 07/11/2016 12:22    Cardiac Studies   Cardiac catheterization 07/08/16: Conclusions: 1.  Significant 2-vessel coronary artery disease including:  Heavily calcified chronic total occlusion of proximal/mid LAD at the origin of 1st septal branch with left-to-left and right-to-left collaterals.  Calcified 80% stenosis of proximal LCx/OM1 with TIMI-3 flow. 2.  Severely reduced LV contraction (LVEF ~30%) with anterior, apical, and apical inferior dyskinesis. 3.  Low normal left ventricular filling pressure (LVEDP 4 mmHg).  Recommendations: 1.  Medical management of NSTEMI, though I suspect mild troponin elevation most likely reflects supply-demand mismatch (type 2 MI)  rather than acute plaque rupture.  I would treat with heparin infusion for 48-72 hours and continue low-dose aspirin. 2.  Recommend cardiac MRI to assess viability of LAD territory.  If there is viability, revascularization of LAD and LCx/OM1 (by PCI or CABG) should be considered.  If the LAD territory is non-viable, PCI to LCx/OM1 should be considered. 3.  Continue secondary prevention with ASA and statin.  Will start carvediolol 3.125 mg BID for CAD and cardiomyopathy. 4.  Hydrate gently overnight, given low LVEDP with reduced LV contraction. 5.  Given unclear circumstances surrounding the patient's fall yesterday, electrophysiology consultation may be beneficial, as tachyarrhythmia due to prior MI/cardiomyopathy could have precipitated yesterday's event (though alcohol intoxication confounds the picture).  [name], MD  Patient Profile     65 year old with mechanical fall, facial laceration, abnormal EKG with troponin  elevation of 0.27, likely type II non-ST elevation myocardial infarction who underwent cardiac catheterization demonstrating a chronically occluded LAD.   Assessment & Plan    Myocardial infarction type II-supply demand mismatch likely from chronically occluded LAD  - Now off heparin. Hgb stable.   - Aspirin 81mg  daily.  WIll need clopidogrel eventually.  - Carvedilol 3.125 twice a day has been started given ejection fraction approximately 35%.  - Cardiac MRI shows no anterior wall viability.  Plan for PCI to the obtuse marginal.  EF 38% by MRI.   Ischemic cardiomyopathy  - Carvedilol started.  BP too low to increase meds.  - Low LVEDP at the time of diagnostic cath on 12/8.  - Was gently hydrated.  Fall with scalp laceration  - Patient adamant this was not syncope but mechanical.  - Alcohol intoxication, level 221 noted on admission. Wound care consult for facial and arm wounds.  She has stitches that will need to come out as well.    Discussed timing of PCI with Dr. Saunders Revel.  Will plan to see her back in the office in a few weeks and then have PCI done as outpatient by Dr. Saunders Revel. Would like her facial issues to be more healed before committing her to the anticoagulation needed for the procedure as well as the Plavix longterm.  Will have to see if any other ENT procedures will be required.      Signed, Larae Grooms, MD  07/12/2016, 7:45 AM

## 2016-07-12 NOTE — Progress Notes (Signed)
Pt is alert and oriented Ra. D/c instruction given, transportation services and husband is accompanying patient.

## 2016-07-12 NOTE — Discharge Summary (Signed)
PATIENT DETAILS Name: Pam [name] Age: 65 y.o. Sex: female Date of Birth: 11-05-1950 MRN: [id number]. Admitting Physician: Rise Patience, MD [id number] ALFRED, MD  Admit Date: 07/07/2016 Discharge date: 07/12/2016  Recommendations for Outpatient Follow-up:  1. Follow up with PCP in the next 2-3 days for suture removal 2. Please ensure follow up with cardiology and ENT 3. Please obtain BMP/CBC in one week 4. Counsel regarding cessation of tobacco use  Admitted From:  Home  Disposition: [place] Agency: No  Equipment/Devices: None  Discharge Condition: Stable  CODE STATUS: FULL CODE  Diet recommendation:  Heart Healthy  Brief Summary: See H&P, Labs, Consult and Test reports for all details in brief, Patient is a 65 y.o. female presented with mechanical fall, facial laceration subsequently found to have NSTEMI.Underwent cardiac catheterization demonstrating 2 vessel disease including a chronically occluded LAD. Cardiac MRI shows that anterior wall is not viable. Further work up including PCI now planned to be done in the outpatient setting by cardiology  Brief Hospital Course: Mechanical fall with right-sided scalp hematoma, depressed nasal bone fracture, Nose and left hand laceration:She was treated in the ER lacerations were sutured, cleared from trauma standpoint by ER, ER physician called Dr. Redmond Baseman ENT who requested outpatient follow-up.TdaP given in the ED. Continues to have significant facial bruising. Note, per patient this was a mechanical fall, she denies ever losing consciousness. Evaluated by physical therapy, no follow-up needed at this point.Evaluated by wound care-recommendation provided (see below)-patient and spouse instructed to follow with PCP over the next few days for suture removal.  NSTEMI: Apparently initial EKG done by EMS showed transient ST elevation in precordial leads that resolved by the time patient presented to the  ED.Cardiology was consulted, she underwent catheterization on 12/8 which showed significant two-vessel disease including a total but chronic occlusion of the LAD.Underwenta cardiac MRI that showed no viability of the anterior wall, cardiology now planning on PCI in the outpatient setting. Recommendations are to continue with aspirin, statin, Coreg on discharge-spoke with Dr Valentino Hue to discharge today.  Chronic systolic heart failure (EF 35-40% by TTE on 07/08/16): Clinically compensated. Thought to have ischemic cardiomyopathy given cardiac cath findings.   Mild hyponatremia: Resolved with just supportive measures.   Hypothyroidism: Continue Synthroid.  Dyslipidemia: Continue statin  Tobacco abuse: Counseled  Alcohol intoxication/abuse: Alcohol level of 221 on presentation-managed with Ativan per protocol, no signs of withdrawal at the time of discharge. She has been counseled regarding avoiding alcohol in the future.   Procedures/Studies: Left heart cath on 07/08/2016 showing multivessel disease  Echocardiogram 12/8: EF [id number], grade 1 diastolic dysfunction  Discharge Diagnoses:  Principal Problem:   Fall Active Problems:   Abnormal EKG   HLD (hyperlipidemia)   Hypothyroidism   Facial laceration   Coronary artery disease involving native coronary artery of native heart without angina pectoris   Hyponatremia  Discharge Instructions:  Activity:  As tolerated with Full fall precautions use walker/cane & assistance as needed   Discharge Instructions    (HEART FAILURE PATIENTS) Call MD:  Anytime you have any of the following symptoms: 1) 3 pound weight gain in 24 hours or 5 pounds in 1 week 2) shortness of breath, with or without a dry hacking cough 3) swelling in the hands, feet or stomach 4) if you have to sleep on extra pillows at night in order to breathe.    Complete by:  As directed    Call MD for:  difficulty breathing, headache or visual  disturbances    Complete  by:  As directed    Call MD for:  redness, tenderness, or signs of infection (pain, swelling, redness, odor or green/yellow discharge around incision site)    Complete by:  As directed    Call MD for:  severe uncontrolled pain    Complete by:  As directed    Diet - low sodium heart healthy    Complete by:  As directed    Discharge instructions    Complete by:  As directed    Follow with Primary MD, ENT MD (Dr Redmond Baseman) and Dr End (Cardiology)  Follow with Primary MD in the next 2-3 days for suture removal  Please get a complete blood count and chemistry panel checked by your Primary MD at your next visit, and again as instructed by your Primary MD.  Get Medicines reviewed and adjusted: Please take all your medications with you for your next visit with your Primary MD  Laboratory/radiological data: Please request your Primary MD to go over all hospital tests and procedure/radiological results at the follow up, please ask your Primary MD to get all Hospital records sent to his/her office.  In some cases, they will be blood work, cultures and biopsy results pending at the time of your discharge. Please request that your primary care M.D. follows up on these results.  Also Note the following: If you experience worsening of your admission symptoms, develop shortness of breath, life threatening emergency, suicidal or homicidal thoughts you must seek medical attention immediately by calling 911 or calling your MD immediately  if symptoms less severe.  You must read complete instructions/literature along with all the possible adverse reactions/side effects for all the Medicines you take and that have been prescribed to you. Take any new Medicines after you have completely understood and accpet all the possible adverse reactions/side effects.   Do not drive when taking Pain medications or sleeping medications (Benzodaizepines)  Do not take more than prescribed Pain, Sleep and Anxiety Medications.  It is not advisable to combine anxiety,sleep and pain medications without talking with your primary care practitioner  Special Instructions: If you have smoked or chewed Tobacco  in the last 2 yrs please stop smoking, stop any regular Alcohol  and or any Recreational drug use.  Wear Seat belts while driving.  Please note: You were cared for by a hospitalist during your hospital stay. Once you are discharged, your primary care physician will handle any further medical issues. Please note that NO REFILLS for any discharge medications will be authorized once you are discharged, as it is imperative that you return to your primary care physician (or establish a relationship with a primary care physician if you do not have one) for your post hospital discharge needs so that they can reassess your need for medications and monitor your lab values.   Discharge wound care:    Complete by:  As directed    Apply neosporin and gauze to bilateral hands and right knee Q day to promote moist healing. Apply neosporin BID and PRN to face and nose affected areas to soften and assist with removal of nonviable tissue; may leave open to air.  Follow with your Primary Care MD in next 2-3 days for suture removal   Increase activity slowly    Complete by:  As directed        Medication List    TAKE these medications   aspirin 81 MG chewable tablet Chew 1 tablet (81 mg  total) by mouth daily. Start taking on:  07/13/2016   atorvastatin 40 MG tablet Commonly known as:  LIPITOR Take 1 tablet (40 mg total) by mouth daily.   carvedilol 3.125 MG tablet Commonly known as:  COREG Take 1 tablet (3.125 mg total) by mouth 2 (two) times daily with a meal.   levothyroxine 25 MCG tablet Commonly known as:  SYNTHROID, LEVOTHROID Take 1 tablet (25 mcg total) by mouth daily before breakfast.      Follow-up Information    BATES, DWIGHT, MD. Schedule an appointment as soon as possible for a visit in 1 week(s).     Specialty:  Otolaryngology Contact information: [address] [place] [address] [phone]        [name], MD Follow up.   Specialty:  Cardiology Why:  office will call yor for a follow up appointment-if you do not hear from them, please give them a call. Contact information: [address] [place] [address] [phone]        Libby Maw, MD. Schedule an appointment as soon as possible for a visit in 3 day(s).   Specialty:  Family Medicine Why:  for post hospital discharge visit and to remove sutures Contact information: [address] [place] [facility] [place] [address] [phone]          No Known Allergies  Consultations:   cardiology   Other Procedures/Studies: Ct Head Wo Contrast  Result Date: 07/07/2016 CLINICAL DATA:  Patient found down on the ground after alcohol consumption. EXAM: CT HEAD WITHOUT CONTRAST CT MAXILLOFACIAL WITHOUT CONTRAST CT CERVICAL SPINE WITHOUT CONTRAST TECHNIQUE: Multidetector CT imaging of the head, cervical spine, and maxillofacial structures were performed using the standard protocol without intravenous contrast. Multiplanar CT image reconstructions of the cervical spine and maxillofacial structures were also generated. COMPARISON:  None. FINDINGS: CT HEAD FINDINGS Brain: No evidence of acute infarction, hemorrhage, hydrocephalus, extra-axial collection or mass lesion/mass effect. Mild generalized atrophy. Vascular: Atherosclerosis of skullbase vasculature without hyperdense vessel or abnormal calcification. Skull: Right frontal scalp hematoma without calvarial fracture. Other: None. CT MAXILLOFACIAL FINDINGS Osseous: Nasal bone fracture, mildly displaced. There is mild rightward nasal septal swelling with possible fracture posteriorly, no nasal septal hematoma. No additional facial bone fracture. Zygomatic arches are intact. No mandibular fracture,  temporomandibular joints are congruent. Orbits: Right supraorbital soft tissue edema. No orbital fracture. Both globes are intact. Sinuses: Clear. Soft tissues: Soft tissue edema about the chin. CT CERVICAL SPINE FINDINGS Alignment: Straightening of normal lordosis. No listhesis, jumped or perched facets. Skull base and vertebrae: No acute fracture. No primary bone lesion or focal pathologic process. Soft tissues and spinal canal: No prevertebral fluid or swelling. No visible canal hematoma. Disc levels: Multilevel disc space narrowing and endplate spurring is most prominent at C5-C6 and C6-C7. There is multilevel facet arthropathy. Upper chest: Emphysema, advanced. Azygos fissure is incidentally noted. Other: Carotid atherosclerosis. IMPRESSION: 1. No acute intracranial abnormality. Right frontal scalp hematoma without skull fracture. 2. Minimally depressed nasal bone fracture. No additional facial bone fracture. 3. Degenerative change throughout cervical spine. No acute fracture or subluxation. 4. Emphysema noted at the lung apices. 5. Carotid and intracranial atherosclerosis. Electronically Signed   By: Jeb Levering M.D.   On: 07/07/2016 20:38   Ct Cervical Spine Wo Contrast  Result Date: 07/07/2016 CLINICAL DATA:  Patient found down on the ground after alcohol consumption. EXAM: CT HEAD WITHOUT CONTRAST CT MAXILLOFACIAL WITHOUT CONTRAST CT CERVICAL  SPINE WITHOUT CONTRAST TECHNIQUE: Multidetector CT imaging of the head, cervical spine, and maxillofacial structures were performed using the standard protocol without intravenous contrast. Multiplanar CT image reconstructions of the cervical spine and maxillofacial structures were also generated. COMPARISON:  None. FINDINGS: CT HEAD FINDINGS Brain: No evidence of acute infarction, hemorrhage, hydrocephalus, extra-axial collection or mass lesion/mass effect. Mild generalized atrophy. Vascular: Atherosclerosis of skullbase vasculature without hyperdense vessel  or abnormal calcification. Skull: Right frontal scalp hematoma without calvarial fracture. Other: None. CT MAXILLOFACIAL FINDINGS Osseous: Nasal bone fracture, mildly displaced. There is mild rightward nasal septal swelling with possible fracture posteriorly, no nasal septal hematoma. No additional facial bone fracture. Zygomatic arches are intact. No mandibular fracture, temporomandibular joints are congruent. Orbits: Right supraorbital soft tissue edema. No orbital fracture. Both globes are intact. Sinuses: Clear. Soft tissues: Soft tissue edema about the chin. CT CERVICAL SPINE FINDINGS Alignment: Straightening of normal lordosis. No listhesis, jumped or perched facets. Skull base and vertebrae: No acute fracture. No primary bone lesion or focal pathologic process. Soft tissues and spinal canal: No prevertebral fluid or swelling. No visible canal hematoma. Disc levels: Multilevel disc space narrowing and endplate spurring is most prominent at C5-C6 and C6-C7. There is multilevel facet arthropathy. Upper chest: Emphysema, advanced. Azygos fissure is incidentally noted. Other: Carotid atherosclerosis. IMPRESSION: 1. No acute intracranial abnormality. Right frontal scalp hematoma without skull fracture. 2. Minimally depressed nasal bone fracture. No additional facial bone fracture. 3. Degenerative change throughout cervical spine. No acute fracture or subluxation. 4. Emphysema noted at the lung apices. 5. Carotid and intracranial atherosclerosis. Electronically Signed   By: Jeb Levering M.D.   On: 07/07/2016 20:38   Mr Cardiac Morphology W Wo Contrast  Result Date: 07/11/2016 CLINICAL DATA:  65 year old female with ischemic cardiomyopathy. Evaluate for viability. EXAM: CARDIAC MRI TECHNIQUE: The patient was scanned on a 1.5 Tesla GE magnet. A dedicated cardiac coil was used. Functional imaging was done using Fiesta sequences. 2,3, and 4 chamber views were done to assess for RWMA's. Modified Simpson's rule  using a short axis stack was used to calculate an ejection fraction on a dedicated work Conservation officer, nature. The patient received 24 cc of Multihance. After 10 minutes inversion recovery sequences were used to assess for infiltration and scar tissue. CONTRAST:  24 cc  of Multihance FINDINGS: 1. Normal size and thickness of the left ventricle. Systolic function is moderately decreased (LVEF = 38%). 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 There is akinesis of the apical lateral, mid anterior, anteroseptal walls. There is late gadolinium enhancement in the following segments: True apex:  75-100% (dismal chance of recovery if revascularized) Apical anterior: 75-100% (dismal chance of recovery if revascularized) Apical septal: 75-100% (dismal chance of recovery if revascularized) Apical lateral: 50-75% (poor chance of recovery if revascularized) Mid anterior: 75-100% (dismal chance of recovery if revascularized) Mid anteroseptal: 50-75% (poor chance of recovery if revascularized) LVEDD:  52 mm LVESD:  40 mm LVEDV:  156 ml LVESV:  96 ml SV:  60 ml Myocardial mass:  122 g 2. Normal right ventricular size, thickness and systolic function (RVEF = 54%) with no regional wall motion abnormalities. 3.  Normal biatrial size. 4.  Mild mitral and mild to moderate tricuspid regurgitation. 5. Normal size of the aortic root, ascending aorta and pulmonary artery. 6.  Normal pericardium, trivial pericardial effusion. IMPRESSION: 1. Normal size and thickness of the left ventricle. Systolic function is moderately decreased (LVEF =  38%). 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 There is akinesis of the apical lateral, mid anterior, anteroseptal walls. The LAD territory is non-viable, RCA and LCX territory is viable. 2. Normal right ventricular size, thickness and systolic function (RVEF = 54%) with no regional wall motion  abnormalities. 3.  Normal biatrial size. 4.  Mild mitral and mild to moderate tricuspid regurgitation. 5. Normal size of the aortic root, ascending aorta and pulmonary artery. 6.  Normal pericardium, trivial pericardial effusion. Ena Dawley Electronically Signed   By: Ena Dawley   On: 07/11/2016 12:22   Ct Maxillofacial Wo Contrast  Result Date: 07/07/2016 CLINICAL DATA:  Patient found down on the ground after alcohol consumption. EXAM: CT HEAD WITHOUT CONTRAST CT MAXILLOFACIAL WITHOUT CONTRAST CT CERVICAL SPINE WITHOUT CONTRAST TECHNIQUE: Multidetector CT imaging of the head, cervical spine, and maxillofacial structures were performed using the standard protocol without intravenous contrast. Multiplanar CT image reconstructions of the cervical spine and maxillofacial structures were also generated. COMPARISON:  None. FINDINGS: CT HEAD FINDINGS Brain: No evidence of acute infarction, hemorrhage, hydrocephalus, extra-axial collection or mass lesion/mass effect. Mild generalized atrophy. Vascular: Atherosclerosis of skullbase vasculature without hyperdense vessel or abnormal calcification. Skull: Right frontal scalp hematoma without calvarial fracture. Other: None. CT MAXILLOFACIAL FINDINGS Osseous: Nasal bone fracture, mildly displaced. There is mild rightward nasal septal swelling with possible fracture posteriorly, no nasal septal hematoma. No additional facial bone fracture. Zygomatic arches are intact. No mandibular fracture, temporomandibular joints are congruent. Orbits: Right supraorbital soft tissue edema. No orbital fracture. Both globes are intact. Sinuses: Clear. Soft tissues: Soft tissue edema about the chin. CT CERVICAL SPINE FINDINGS Alignment: Straightening of normal lordosis. No listhesis, jumped or perched facets. Skull base and vertebrae: No acute fracture. No primary bone lesion or focal pathologic process. Soft tissues and spinal canal: No prevertebral fluid or swelling. No visible  canal hematoma. Disc levels: Multilevel disc space narrowing and endplate spurring is most prominent at C5-C6 and C6-C7. There is multilevel facet arthropathy. Upper chest: Emphysema, advanced. Azygos fissure is incidentally noted. Other: Carotid atherosclerosis. IMPRESSION: 1. No acute intracranial abnormality. Right frontal scalp hematoma without skull fracture. 2. Minimally depressed nasal bone fracture. No additional facial bone fracture. 3. Degenerative change throughout cervical spine. No acute fracture or subluxation. 4. Emphysema noted at the lung apices. 5. Carotid and intracranial atherosclerosis. Electronically Signed   By: Jeb Levering M.D.   On: 07/07/2016 20:38     TODAY-DAY OF DISCHARGE:  Subjective:   Kensleigh Cuadra today has no headache,no chest abdominal pain,no new weakness tingling or numbness  Objective:   Blood pressure (!) 112/54, pulse 68, temperature 97.6 F (36.4 C), temperature source Oral, resp. rate 16, height 5\' 3"  (1.6 m), weight 55.2 kg (121 lb 11.2 oz), SpO2 97 %.  Intake/Output Summary (Last 24 hours) at 07/12/16 1205 Last data filed at 07/12/16 1104  Gross per 24 hour  Intake            27622 ml  Output             4320 ml  Net            23302 ml   Filed Weights   07/10/16 0437 07/11/16 0446 07/12/16 0423  Weight: 54.2 kg (119 lb 8 oz) 55.7 kg (122 lb 14.4 oz) 55.2 kg (121 lb 11.2 oz)    Exam: Awake Alert, Oriented *3, No new F.N deficits, Normal affect [place].AT,PERRAL Supple Neck,No  JVD, No cervical lymphadenopathy appriciated.  Symmetrical Chest wall movement, Good air movement bilaterally, CTAB RRR,No Gallops,Rubs or new Murmurs, No Parasternal Heave +ve B.Sounds, Abd Soft, Non tender, No organomegaly appriciated, No rebound -guarding or rigidity. No Cyanosis, Clubbing or edema, No new Rash or bruise   PERTINENT RADIOLOGIC STUDIES: Ct Head Wo Contrast  Result Date: 07/07/2016 CLINICAL DATA:  Patient found down on the ground after alcohol  consumption. EXAM: CT HEAD WITHOUT CONTRAST CT MAXILLOFACIAL WITHOUT CONTRAST CT CERVICAL SPINE WITHOUT CONTRAST TECHNIQUE: Multidetector CT imaging of the head, cervical spine, and maxillofacial structures were performed using the standard protocol without intravenous contrast. Multiplanar CT image reconstructions of the cervical spine and maxillofacial structures were also generated. COMPARISON:  None. FINDINGS: CT HEAD FINDINGS Brain: No evidence of acute infarction, hemorrhage, hydrocephalus, extra-axial collection or mass lesion/mass effect. Mild generalized atrophy. Vascular: Atherosclerosis of skullbase vasculature without hyperdense vessel or abnormal calcification. Skull: Right frontal scalp hematoma without calvarial fracture. Other: None. CT MAXILLOFACIAL FINDINGS Osseous: Nasal bone fracture, mildly displaced. There is mild rightward nasal septal swelling with possible fracture posteriorly, no nasal septal hematoma. No additional facial bone fracture. Zygomatic arches are intact. No mandibular fracture, temporomandibular joints are congruent. Orbits: Right supraorbital soft tissue edema. No orbital fracture. Both globes are intact. Sinuses: Clear. Soft tissues: Soft tissue edema about the chin. CT CERVICAL SPINE FINDINGS Alignment: Straightening of normal lordosis. No listhesis, jumped or perched facets. Skull base and vertebrae: No acute fracture. No primary bone lesion or focal pathologic process. Soft tissues and spinal canal: No prevertebral fluid or swelling. No visible canal hematoma. Disc levels: Multilevel disc space narrowing and endplate spurring is most prominent at C5-C6 and C6-C7. There is multilevel facet arthropathy. Upper chest: Emphysema, advanced. Azygos fissure is incidentally noted. Other: Carotid atherosclerosis. IMPRESSION: 1. No acute intracranial abnormality. Right frontal scalp hematoma without skull fracture. 2. Minimally depressed nasal bone fracture. No additional facial bone  fracture. 3. Degenerative change throughout cervical spine. No acute fracture or subluxation. 4. Emphysema noted at the lung apices. 5. Carotid and intracranial atherosclerosis. Electronically Signed   By: Jeb Levering M.D.   On: 07/07/2016 20:38   Ct Cervical Spine Wo Contrast  Result Date: 07/07/2016 CLINICAL DATA:  Patient found down on the ground after alcohol consumption. EXAM: CT HEAD WITHOUT CONTRAST CT MAXILLOFACIAL WITHOUT CONTRAST CT CERVICAL SPINE WITHOUT CONTRAST TECHNIQUE: Multidetector CT imaging of the head, cervical spine, and maxillofacial structures were performed using the standard protocol without intravenous contrast. Multiplanar CT image reconstructions of the cervical spine and maxillofacial structures were also generated. COMPARISON:  None. FINDINGS: CT HEAD FINDINGS Brain: No evidence of acute infarction, hemorrhage, hydrocephalus, extra-axial collection or mass lesion/mass effect. Mild generalized atrophy. Vascular: Atherosclerosis of skullbase vasculature without hyperdense vessel or abnormal calcification. Skull: Right frontal scalp hematoma without calvarial fracture. Other: None. CT MAXILLOFACIAL FINDINGS Osseous: Nasal bone fracture, mildly displaced. There is mild rightward nasal septal swelling with possible fracture posteriorly, no nasal septal hematoma. No additional facial bone fracture. Zygomatic arches are intact. No mandibular fracture, temporomandibular joints are congruent. Orbits: Right supraorbital soft tissue edema. No orbital fracture. Both globes are intact. Sinuses: Clear. Soft tissues: Soft tissue edema about the chin. CT CERVICAL SPINE FINDINGS Alignment: Straightening of normal lordosis. No listhesis, jumped or perched facets. Skull base and vertebrae: No acute fracture. No primary bone lesion or focal pathologic process. Soft tissues and spinal canal: No prevertebral fluid or swelling. No visible canal hematoma. Disc levels: Multilevel disc space narrowing  and endplate spurring is most prominent at C5-C6 and C6-C7. There is multilevel facet arthropathy. Upper chest: Emphysema, advanced. Azygos fissure is incidentally noted. Other: Carotid atherosclerosis. IMPRESSION: 1. No acute intracranial abnormality. Right frontal scalp hematoma without skull fracture. 2. Minimally depressed nasal bone fracture. No additional facial bone fracture. 3. Degenerative change throughout cervical spine. No acute fracture or subluxation. 4. Emphysema noted at the lung apices. 5. Carotid and intracranial atherosclerosis. Electronically Signed   By: Jeb Levering M.D.   On: 07/07/2016 20:38   Mr Cardiac Morphology W Wo Contrast  Result Date: 07/11/2016 CLINICAL DATA:  65 year old female with ischemic cardiomyopathy. Evaluate for viability. EXAM: CARDIAC MRI TECHNIQUE: The patient was scanned on a 1.5 Tesla GE magnet. A dedicated cardiac coil was used. Functional imaging was done using [employer] sequences. 2,3, and 4 chamber views were done to assess for RWMA's. Modified Simpson's rule using a short axis stack was used to calculate an ejection fraction on a dedicated work Conservation officer, nature. The patient received 24 cc of Multihance. After 10 minutes inversion recovery sequences were used to assess for infiltration and scar tissue. CONTRAST:  24 cc  of Multihance FINDINGS: 1. Normal size and thickness of the left ventricle. Systolic function is moderately decreased (LVEF = 38%). 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 There is akinesis of the apical lateral, mid anterior, anteroseptal walls. There is late gadolinium enhancement in the following segments: True apex:  75-100% (dismal chance of recovery if revascularized) Apical anterior: 75-100% (dismal chance of recovery if revascularized) Apical septal: 75-100% (dismal chance of recovery if revascularized) Apical lateral: 50-75% (poor chance of recovery if revascularized) Mid  anterior: 75-100% (dismal chance of recovery if revascularized) Mid anteroseptal: 50-75% (poor chance of recovery if revascularized) LVEDD:  52 mm LVESD:  40 mm LVEDV:  156 ml LVESV:  96 ml SV:  60 ml Myocardial mass:  122 g 2. Normal right ventricular size, thickness and systolic function (RVEF = 54%) with no regional wall motion abnormalities. 3.  Normal biatrial size. 4.  Mild mitral and mild to moderate tricuspid regurgitation. 5. Normal size of the aortic root, ascending aorta and pulmonary artery. 6.  Normal pericardium, trivial pericardial effusion. IMPRESSION: 1. Normal size and thickness of the left ventricle. Systolic function is moderately decreased (LVEF = 38%). 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 There is akinesis of the apical lateral, mid anterior, anteroseptal walls. The LAD territory is non-viable, RCA and LCX territory is viable. 2. Normal right ventricular size, thickness and systolic function (RVEF = 54%) with no regional wall motion abnormalities. 3.  Normal biatrial size. 4.  Mild mitral and mild to moderate tricuspid regurgitation. 5. Normal size of the aortic root, ascending aorta and pulmonary artery. 6.  Normal pericardium, trivial pericardial effusion. Ena Dawley Electronically Signed   By: Ena Dawley   On: 07/11/2016 12:22   Ct Maxillofacial Wo Contrast  Result Date: 07/07/2016 CLINICAL DATA:  Patient found down on the ground after alcohol consumption. EXAM: CT HEAD WITHOUT CONTRAST CT MAXILLOFACIAL WITHOUT CONTRAST CT CERVICAL SPINE WITHOUT CONTRAST TECHNIQUE: Multidetector CT imaging of the head, cervical spine, and maxillofacial structures were performed using the standard protocol without intravenous contrast. Multiplanar CT image reconstructions of the cervical spine and maxillofacial structures were also generated. COMPARISON:  None. FINDINGS: CT HEAD FINDINGS Brain: No evidence of acute infarction, hemorrhage,  hydrocephalus, extra-axial collection or mass  lesion/mass effect. Mild generalized atrophy. Vascular: Atherosclerosis of skullbase vasculature without hyperdense vessel or abnormal calcification. Skull: Right frontal scalp hematoma without calvarial fracture. Other: None. CT MAXILLOFACIAL FINDINGS Osseous: Nasal bone fracture, mildly displaced. There is mild rightward nasal septal swelling with possible fracture posteriorly, no nasal septal hematoma. No additional facial bone fracture. Zygomatic arches are intact. No mandibular fracture, temporomandibular joints are congruent. Orbits: Right supraorbital soft tissue edema. No orbital fracture. Both globes are intact. Sinuses: Clear. Soft tissues: Soft tissue edema about the chin. CT CERVICAL SPINE FINDINGS Alignment: Straightening of normal lordosis. No listhesis, jumped or perched facets. Skull base and vertebrae: No acute fracture. No primary bone lesion or focal pathologic process. Soft tissues and spinal canal: No prevertebral fluid or swelling. No visible canal hematoma. Disc levels: Multilevel disc space narrowing and endplate spurring is most prominent at C5-C6 and C6-C7. There is multilevel facet arthropathy. Upper chest: Emphysema, advanced. Azygos fissure is incidentally noted. Other: Carotid atherosclerosis. IMPRESSION: 1. No acute intracranial abnormality. Right frontal scalp hematoma without skull fracture. 2. Minimally depressed nasal bone fracture. No additional facial bone fracture. 3. Degenerative change throughout cervical spine. No acute fracture or subluxation. 4. Emphysema noted at the lung apices. 5. Carotid and intracranial atherosclerosis. Electronically Signed   By: Jeb Levering M.D.   On: 07/07/2016 20:38     PERTINENT LAB RESULTS: CBC:  Recent Labs  07/09/16 2334 07/11/16 0416  WBC 6.8 8.9  HGB 12.3 13.0  HCT 35.8* 38.9  PLT 208 236   CMET CMP     Component Value Date/Time   NA 135 07/11/2016 0416   K 4.4 07/11/2016  0416   CL 102 07/11/2016 0416   CO2 22 07/11/2016 0416   GLUCOSE 104 (H) 07/11/2016 0416   BUN 8 07/11/2016 0416   CREATININE 0.76 07/11/2016 0416   CALCIUM 9.3 07/11/2016 0416   PROT 6.4 (L) 07/07/2016 1914   ALBUMIN 3.8 07/07/2016 1914   AST 31 07/07/2016 1914   ALT 27 07/07/2016 1914   ALKPHOS 58 07/07/2016 1914   BILITOT 0.8 07/07/2016 1914   GFRNONAA >60 07/11/2016 0416   GFRAA >60 07/11/2016 0416    GFR Estimated Creatinine Clearance: 58.8 mL/min (by C-G formula based on SCr of 0.76 mg/dL). No results for input(s): LIPASE, AMYLASE in the last 72 hours. No results for input(s): CKTOTAL, CKMB, CKMBINDEX, TROPONINI in the last 72 hours. Invalid input(s): POCBNP No results for input(s): DDIMER in the last 72 hours. No results for input(s): HGBA1C in the last 72 hours. No results for input(s): CHOL, HDL, LDLCALC, TRIG, CHOLHDL, LDLDIRECT in the last 72 hours. No results for input(s): TSH, T4TOTAL, T3FREE, THYROIDAB in the last 72 hours.  Invalid input(s): FREET3 No results for input(s): VITAMINB12, FOLATE, FERRITIN, TIBC, IRON, RETICCTPCT in the last 72 hours. Coags: No results for input(s): INR in the last 72 hours.  Invalid input(s): PT Microbiology: No results found for this or any previous visit (from the past 240 hour(s)).  FURTHER DISCHARGE INSTRUCTIONS:  Get Medicines reviewed and adjusted: Please take all your medications with you for your next visit with your Primary MD  Laboratory/radiological data: Please request your Primary MD to go over all hospital tests and procedure/radiological results at the follow up, please ask your Primary MD to get all Hospital records sent to his/her office.  In some cases, they will be blood work, cultures and biopsy results pending at the time of your discharge. Please request that your primary care M.D. goes through  all the records of your hospital data and follows up on these results.  Also Note the following: If you  experience worsening of your admission symptoms, develop shortness of breath, life threatening emergency, suicidal or homicidal thoughts you must seek medical attention immediately by calling 911 or calling your MD immediately  if symptoms less severe.  You must read complete instructions/literature along with all the possible adverse reactions/side effects for all the Medicines you take and that have been prescribed to you. Take any new Medicines after you have completely understood and accpet all the possible adverse reactions/side effects.   Do not drive when taking Pain medications or sleeping medications (Benzodaizepines)  Do not take more than prescribed Pain, Sleep and Anxiety Medications. It is not advisable to combine anxiety,sleep and pain medications without talking with your primary care practitioner  Special Instructions: If you have smoked or chewed Tobacco  in the last 2 yrs please stop smoking, stop any regular Alcohol  and or any Recreational drug use.  Wear Seat belts while driving.  Please note: You were cared for by a hospitalist during your hospital stay. Once you are discharged, your primary care physician will handle any further medical issues. Please note that NO REFILLS for any discharge medications will be authorized once you are discharged, as it is imperative that you return to your primary care physician (or establish a relationship with a primary care physician if you do not have one) for your post hospital discharge needs so that they can reassess your need for medications and monitor your lab values.  Total Time spent coordinating discharge including counseling, education and face to face time equals 45 minutes.  SignedOren Binet 07/12/2016 12:05 PM

## 2016-07-12 NOTE — Consult Note (Addendum)
[facility] Nurse wound consult note Reason for Consult: Consult requested for multiple full thickness lacerations/abrasions related to a fall prior to admission.  Pt had ENT suture her nose and face in the ER according to the progress notes, and the ER physician sutured her left hand.   Wound type: Left hand with sutures surrounded by dried clotted blood.  Also has a dry hematoma.8X.8cm tightly adhered.  Both sites are dark purple-red, no odor or drainage. Right knee with same appearance; dry hematoma.1X.1cm tightly adhered, dark purple-red, no odor or drainage. Right hand with same appearance; dry hematoma.1.5X1.5cm  tightly adhered, dark purple-red, no odor or drainage. Face and nose with multiple areas of dry hematoma tightly adhered, dark purple-red, no odor or drainage; this is high risk to evolve into eschar to the tip of the nose and below the nose. Sutures are located underneath the old clotted blood areas. Dressing procedure/placement/frequency: Pt states she is planning to discharge home today.  She will need follow up apt with ENT next week to remove sutures and assess hematoma areas to face and nose and provide further plan of care. Reviewed topical treatment at home with the patient and her husband; shower without dressings to soften and assist with removal of nonviable tissue to bilat hands and right knee.  Apply neosporin and gauze to these sites Q day to promote moist healing. Apply neosporin BID and PRN to face and nose affected areas to soften and assist with removal of nonviable tissue; may leave open to air. They verbalized understanding. Please re-consult if further assistance is needed.  Thank-you,  Julien Girt MSN, [place], [place], [place], [place]

## 2016-07-12 NOTE — Progress Notes (Signed)
Physical Therapy Treatment Patient Details Name: Pam [name] MRN: [id number] DOB: [birth year] Today's Date: 07/12/2016    History of Present Illness Pt is a 65 y/o female admitted secondary to falling at home. Pt had been drinking alcohol prior to fall and attempted to take the trash out and fell on her face. Head CT negative. Incidental findings of ST elevation. PMH including but not limited to hypothyroidism and hyperlipidemia.    PT Comments    Patient continues to improve mobility and activity tolerance. Ambulated in hall without device, performed stair negotiation. All VSS at this time. Educated on safety with mobility for d/c home.  Follow Up Recommendations  No PT follow up;Supervision - Intermittent     Equipment Recommendations  None recommended by PT    Recommendations for Other Services       Precautions / Restrictions Precautions Precautions: Fall Restrictions Weight Bearing Restrictions: No    Mobility  Bed Mobility Overal bed mobility: Needs Assistance Bed Mobility: Sit to Supine       Sit to supine: Supervision   General bed mobility comments: supervision for safety and positioning  Transfers Overall transfer level: Needs assistance Equipment used: None Transfers: Sit to/from Stand Sit to Stand: Supervision            Ambulation/Gait Ambulation/Gait assistance: Supervision Ambulation Distance (Feet): 510 Feet Assistive device: None Gait Pattern/deviations: Step-through pattern;Decreased stride length Gait velocity: decreased   General Gait Details: improved stability today   Stairs Stairs: Yes   Stair Management: One rail Right Number of Stairs: 6 General stair comments: min guard for safety  Wheelchair Mobility    Modified Rankin (Stroke Patients Only)       Balance Overall balance assessment: Needs assistance Sitting-balance support: Feet supported;No upper extremity supported Sitting balance-Leahy Scale: Good      Standing balance support: During functional activity;No upper extremity supported Standing balance-Leahy Scale: Fair                      Cognition Arousal/Alertness: Awake/alert Behavior During Therapy: WFL for tasks assessed/performed Overall Cognitive Status: Within Functional Limits for tasks assessed                      Exercises      General Comments        Pertinent Vitals/Pain Pain Assessment: Faces Faces Pain Scale: Hurts little more Pain Location: head Pain Descriptors / Indicators: Headache Pain Intervention(s): Monitored during session    Home Living                      Prior Function            PT Goals (current goals can now be found in the care plan section) Acute Rehab PT Goals Patient Stated Goal: return home PT Goal Formulation: With patient/family Time For Goal Achievement: 07/23/16 Potential to Achieve Goals: Good Progress towards PT goals: Progressing toward goals    Frequency    Min 3X/week      PT Plan Current plan remains appropriate    Co-evaluation             End of Session Equipment Utilized During Treatment: Gait belt Activity Tolerance: Patient tolerated treatment well Patient left: in bed;with call bell/phone within reach;with family/visitor present     Time: 1210-1221 PT Time Calculation (min) (ACUTE ONLY): 11 min  Charges:  $Gait Training: 8-22 mins  G Codes:      Duncan Dull 07/12/2016, 4:10 PM Alben Deeds, [place] DPT  [phone]

## 2016-08-03 ENCOUNTER — Telehealth: Payer: Self-pay | Admitting: Internal Medicine

## 2016-08-03 MED ORDER — CARVEDILOL 3.125 MG PO TABS: 3.1250 mg | ORAL_TABLET | Freq: Two times a day (BID) | ORAL | 0 refills | Status: DC

## 2016-08-03 NOTE — Telephone Encounter (Signed)
New message  Pt call requesting to speak with RN. Pt states she would like to wait until the RN returns call to discuss. Please call back to discuss

## 2016-08-03 NOTE — Telephone Encounter (Signed)
Patient is needing refill on carvedilol and also follow up appointment post heart cath. Refill for carvedilol sent to pharmacy and f/u appt given for 08/25/16 @4 :00 pm with Dr. Saunders Revel.

## 2016-08-25 ENCOUNTER — Encounter: Payer: Self-pay | Admitting: *Deleted

## 2016-08-25 ENCOUNTER — Ambulatory Visit (INDEPENDENT_AMBULATORY_CARE_PROVIDER_SITE_OTHER): Payer: Federal, State, Local not specified - PPO | Admitting: Internal Medicine

## 2016-08-25 ENCOUNTER — Encounter: Payer: Self-pay | Admitting: Internal Medicine

## 2016-08-25 VITALS — BP 140/78 | HR 80 | Ht 63.0 in | Wt 125.0 lb

## 2016-08-25 DIAGNOSIS — Z01812 Encounter for preprocedural laboratory examination: Secondary | ICD-10-CM | POA: Diagnosis not present

## 2016-08-25 DIAGNOSIS — I25118 Atherosclerotic heart disease of native coronary artery with other forms of angina pectoris: Secondary | ICD-10-CM | POA: Diagnosis not present

## 2016-08-25 DIAGNOSIS — I255 Ischemic cardiomyopathy: Secondary | ICD-10-CM

## 2016-08-25 DIAGNOSIS — I5022 Chronic systolic (congestive) heart failure: Secondary | ICD-10-CM

## 2016-08-25 DIAGNOSIS — R42 Dizziness and giddiness: Secondary | ICD-10-CM

## 2016-08-25 MED ORDER — LISINOPRIL 5 MG PO TABS: 5.0000 mg | ORAL_TABLET | Freq: Every day | ORAL | 1 refills | Status: DC

## 2016-08-25 NOTE — Patient Instructions (Addendum)
Medication Instructions:  Start lisinopril 5mg  daily  Labwork: BMET/CBCd/PT/INR today  Testing/Procedures: Your physician has requested that you have a cardiac catheterization. Cardiac catheterization is used to diagnose and/or treat various heart conditions. Doctors may recommend this procedure for a number of different reasons. The most common reason is to evaluate chest pain. Chest pain can be a symptom of coronary artery disease (CAD), and cardiac catheterization can show whether plaque is narrowing or blocking your heart's arteries. This procedure is also used to evaluate the valves, as well as measure the blood flow and oxygen levels in different parts of your heart. For further information please visit [email or web address]. Please follow instruction sheet, as given.  Monday August 29, 2016  Follow-Up:  Your physician recommends that you schedule a follow-up appointment in: 1 month with Dr End.          If you need a refill on your cardiac medications before your next appointment, please call your pharmacy.

## 2016-08-25 NOTE — Progress Notes (Signed)
Follow-up Outpatient Visit Date: 08/25/2016  Primary Care Provider: Libby Maw, MD [address] [place] [address]  Chief Complaint: Chest pain, shortness of breath, and dizziness  HPI:  Pam [name] is a 66 y.o. year-old female with history of coronary artery disease, ischemic cardiomyopathy, hyperlipidemia, and prior tobacco use, who presents for follow-up after NSTEMI last month. The patient presented after a mechanical fall during which she fell forward while taking out her trash, striking her face and for head on her driveway. She had difficulty getting up and was ultimately brought to the emergency department by EMS. EKG demonstrated subtle anterolateral ST elevations, though the patient was chest pain-free. Cardiac catheterization was initially deferred but subsequently performed due to mild troponin elevation, which peaked at 0.27. Catheterization revealed chronic total occlusion of the mid LAD as well as high-grade disease of the proximal LCx. Cardiac MRI revealed anterior and apical scar without viable myocardium and an LVEF of 38%. Due to her lack of cardiac symptoms and recent fall with multiple facial abrasions and lacerations, the decision was made to defer intervention to the LCx.  Today, the patient reports that she has been feeling relatively well. She has noticeable fatigue as well as exertional dyspnea. She also endorses orthostatic lightheadedness and occasional sharp, substernal chest pains lasting a few minutes at a time. These can happen both at rest and with exertion. One particularly significant episode occurred while lying in bed last week. She noticed dyspnea and chest pain lasting several minutes. Upon standing, she was lightheaded. She denies palpitations, as well as falls and syncope. She notes 4 pillow orthopnea since leaving the hospital but no leg edema. She limits her salt intake but tries to stay well-hydrated. She is compliant with her medications,  including aspirin, atorvastatin, and carvedilol. She happily reports today that she has been abstinent from tobacco and alcohol since leaving the hospital. She has noted some fluctuations in her weight, which she attributes to appetite changes.  --------------------------------------------------------------------------------------------------  Cardiovascular History & Procedures: Cardiovascular Problems:  Coronary artery disease with NSTEMI and chronic total occlusion of the proximal/mid LAD  Ischemic cardiomyopathy  Risk Factors:  Known coronary artery disease, hyperlipidemia, tobacco use, age  Cath/PCI:  LHC (07/08/16): Significant two-vessel coronary artery disease including totally calcified chronic total occlusion of the proximal/mid LAD and calcified 80% stenosis of proximal LCx/OM one. LVEF 30% with anterior, apical, and apical inferior disc low normal LVEDP (4 mmHg).  CV Surgery:  None  EP Procedures and Devices:  None  Non-Invasive Evaluation(s):  Cardiac MRI (07/11/16): Normal LV size and wall thickness with moderately reduced contraction (EF 38%). Akinesis/dyskinesis of the mid and apical anterior/anteroseptal and apical segments. LAD territory is nonviable. RCA and LCx territories are viable. Normal RV size and function. Normal biatrial size.  Transthoracic echocardiogram (07/08/16): Normal LV size and wall thickness with LVEF of 35-40%. Anterior and apical wall motion abnormality present. Grade 1 diastolic dysfunction. Normal RV size and function. No significant valvular abnormalities.  Recent CV Pertinent Labs: Lab Results  Component Value Date   CHOL 174 07/07/2016   HDL 87 07/07/2016   LDLCALC 70 07/07/2016   TRIG 87 07/07/2016   CHOLHDL 2.0 07/07/2016   INR 1.04 07/07/2016   K 4.4 07/11/2016   BUN 8 07/11/2016   CREATININE 0.76 07/11/2016    Past medical and surgical history were reviewed and updated in EPIC.  Outpatient Encounter Prescriptions as of  08/25/2016  Medication Sig  . aspirin 81 MG chewable  tablet Chew 1 tablet (81 mg total) by mouth daily.  Marland Kitchen atorvastatin (LIPITOR) 40 MG tablet Take 1 tablet (40 mg total) by mouth daily.  . carvedilol (COREG) 3.125 MG tablet Take 1 tablet (3.125 mg total) by mouth 2 (two) times daily with a meal.  . levothyroxine (SYNTHROID, LEVOTHROID) 25 MCG tablet Take 1 tablet (25 mcg total) by mouth daily before breakfast.   No facility-administered encounter medications on file as of 08/25/2016.     Allergies: Patient has no known allergies.  Social History   Social History  . Marital status: Married    Spouse name: N/A  . Number of children: 2  . Years of education: N/A   Occupational History  . Retired-worked for post office    Social History Main Topics  . Smoking status: Former Smoker    Packs/day: 1.00    Years: 50.00    Types: Cigarettes    Quit date: 07/07/2016  . Smokeless tobacco: Never Used  . Alcohol use Yes     Comment: she drinks 2-3 days per week  . Drug use: No  . Sexual activity: Not on file   Other Topics Concern  . Not on file   Social History Narrative  . No narrative on file    Family History  Problem Relation Age of Onset  . Kidney failure Mother     Review of Systems: A 12-system review of systems was performed and was negative except as noted in the HPI.  --------------------------------------------------------------------------------------------------  Physical Exam: BP 140/78 (BP Location: Left Arm, Patient Position: Sitting, Cuff Size: Normal)   Pulse 80   Ht 5\' 3"  (1.6 m)   Wt 125 lb (56.7 kg)   BMI 22.14 kg/m   General:  Well-developed, well-nourished woman seated comfortably in the exam room. HEENT: No conjunctival pallor or scleral icterus.  Moist mucous membranes.  OP clear. Bruising under the right eye is present. Neck: Supple without lymphadenopathy, thyromegaly, JVD, or HJR.  No carotid bruit. Lungs: Normal work of breathing.  Clear  to auscultation bilaterally without wheezes or crackles. Heart: Regular rate and rhythm without murmurs, rubs, or gallops.  Non-displaced PMI. Abd: Bowel sounds present.  Soft, NT/ND without hepatosplenomegaly Ext: No lower extremity edema.  Radial, PT, and DP pulses are 2+ bilaterally. Right radial arteriotomy site is well-healed with normal Allen's test. Skin: warm and dry; healing abrasions along face and both hands.  EKG:  Normal sinus rhythm with biphasic T waves in V2 and V3 as well as T-wave inversions in 4 and V5. Compared with prior tracing from 07/08/16, anterior T-wave changes are more pronounced. (I have personally reviewed both tracings).  Lab Results  Component Value Date   WBC 8.9 07/11/2016   HGB 13.0 07/11/2016   HCT 38.9 07/11/2016   MCV 90.0 07/11/2016   PLT 236 07/11/2016    Lab Results  Component Value Date   NA 135 07/11/2016   K 4.4 07/11/2016   CL 102 07/11/2016   CO2 22 07/11/2016   BUN 8 07/11/2016   CREATININE 0.76 07/11/2016   GLUCOSE 104 (H) 07/11/2016   ALT 27 07/07/2016    Lab Results  Component Value Date   CHOL 174 07/07/2016   HDL 87 07/07/2016   LDLCALC 70 07/07/2016   TRIG 87 07/07/2016   CHOLHDL 2.0 07/07/2016    --------------------------------------------------------------------------------------------------  ASSESSMENT AND PLAN: Coronary artery disease Patient found to have significant two-vessel CAD including CTO of the proximal/mid LAD with anterior scar. She also  has significant disease involving proximal LCx and OM branch. Given that she has significant exertional dyspnea and intermittent chest pain, we have agreed to proceed with cardiac catheterization and planned PCI to the LCx. Given heavy calcification of the lesion, we will plan to use orbital atherectomy. I have reviewed the risks, indications, and alternatives to cardiac catheterization, possible angioplasty, and stenting with the patient. Risks include but are not limited  to bleeding, infection, vascular injury, stroke, myocardial infection, arrhythmia, kidney injury, radiation-related injury in the case of prolonged fluoroscopy use, emergency cardiac surgery, and death. The patient understands the risks of serious complication is 1-2 in 123XX123 with diagnostic cardiac cath and 1-2% or less with angioplasty/stenting. In the meantime, the patient should remain on low-dose aspirin and atorvastatin. We will check precath labs, including basic metabolic panel, CBC, and PT/INR today.  Ischemic cardiomyopathy with chronic systolic heart failure Moderately reduced LV function noted by echo and cardiac MRI. She appears euvolemic on exam today, though she notes orthopnea and exertional dyspnea. She was noted to have a low LVEDP at the time of cardiac catheterization last month. We will continue with carvedilol and add lisinopril 5 mg daily.  Dizziness This is predominantly positional suggestive of orthostatic lightheadedness. Orthostatic vital signs today demonstrate borderline blood pressure and heart rate response, though overall her standing blood pressure is still elevated. I suspect some of her symptoms may be related to concussion at the time of her fall last month. Of note, head CT at that time showed no intracranial abnormality. Another consideration would be transient arrhythmia, given her cardiomyopathy and extensive LV scar. After catheterization, we will consider placement of an event monitor to evaluate for arrhythmias. I encouraged the patient to remain well-hydrated but continue with salt restriction due to her heart and myopathy.  Follow-up: Return to clinic in one month.  Pam Bush, MD 08/25/2016 4:03 PM

## 2016-08-26 LAB — BASIC METABOLIC PANEL
BUN/Creatinine Ratio: 14 (ref 12–28)
BUN: 12 mg/dL (ref 8–27)
CALCIUM: 10 mg/dL (ref 8.7–10.3)
CO2: 25 mmol/L (ref 18–29)
CREATININE: 0.87 mg/dL (ref 0.57–1.00)
Chloride: 95 mmol/L — ABNORMAL LOW (ref 96–106)
GFR calc Af Amer: 81 mL/min/{1.73_m2} (ref >59)
GFR, EST NON AFRICAN AMERICAN: 70 mL/min/{1.73_m2} (ref >59)
Glucose: 96 mg/dL (ref 65–99)
POTASSIUM: 4.4 mmol/L (ref 3.5–5.2)
Sodium: 136 mmol/L (ref 134–144)

## 2016-08-26 LAB — CBC WITH DIFFERENTIAL/PLATELET
BASOS: 0 %
Basophils Absolute: 0 10*3/uL (ref 0.0–0.2)
EOS (ABSOLUTE): 0.2 10*3/uL (ref 0.0–0.4)
Eos: 3 %
Hematocrit: 42.1 % (ref 34.0–46.6)
Hemoglobin: 14 g/dL (ref 11.1–15.9)
IMMATURE GRANS (ABS): 0 10*3/uL (ref 0.0–0.1)
IMMATURE GRANULOCYTES: 0 %
LYMPHS: 41 %
Lymphocytes Absolute: 2.9 10*3/uL (ref 0.7–3.1)
MCH: 29.6 pg (ref 26.6–33.0)
MCHC: 33.3 g/dL (ref 31.5–35.7)
MCV: 89 fL (ref 79–97)
MONOCYTES: 9 %
Monocytes Absolute: 0.6 10*3/uL (ref 0.1–0.9)
NEUTROS PCT: 47 %
Neutrophils Absolute: 3.3 10*3/uL (ref 1.4–7.0)
PLATELETS: 302 10*3/uL (ref 150–379)
RBC: 4.73 x10E6/uL (ref 3.77–5.28)
RDW: 14.6 % (ref 12.3–15.4)
WBC: 7 10*3/uL (ref 3.4–10.8)

## 2016-08-26 LAB — PROTIME-INR
INR: 1.1 (ref 0.8–1.2)
Prothrombin Time: 11.4 s (ref 9.1–12.0)

## 2016-08-27 ENCOUNTER — Other Ambulatory Visit: Payer: Self-pay | Admitting: Internal Medicine

## 2016-08-27 DIAGNOSIS — I5021 Acute systolic (congestive) heart failure: Secondary | ICD-10-CM

## 2016-08-27 DIAGNOSIS — I25118 Atherosclerotic heart disease of native coronary artery with other forms of angina pectoris: Secondary | ICD-10-CM

## 2016-08-27 DIAGNOSIS — I255 Ischemic cardiomyopathy: Secondary | ICD-10-CM

## 2016-08-29 ENCOUNTER — Ambulatory Visit (HOSPITAL_COMMUNITY)
Admission: RE | Admit: 2016-08-29 | Discharge: 2016-08-31 | Disposition: A | Discharge status: Home / Self Care | Payer: Federal, State, Local not specified - PPO | Source: Ambulatory Visit | Attending: Internal Medicine | Admitting: Internal Medicine

## 2016-08-29 ENCOUNTER — Encounter (HOSPITAL_COMMUNITY)
Admission: RE | Disposition: A | Discharge status: Home / Self Care | Payer: Self-pay | Source: Ambulatory Visit | Attending: Internal Medicine

## 2016-08-29 DIAGNOSIS — E785 Hyperlipidemia, unspecified: Secondary | ICD-10-CM | POA: Insufficient documentation

## 2016-08-29 DIAGNOSIS — I200 Unstable angina: Secondary | ICD-10-CM | POA: Diagnosis present

## 2016-08-29 DIAGNOSIS — Z7982 Long term (current) use of aspirin: Secondary | ICD-10-CM | POA: Diagnosis not present

## 2016-08-29 DIAGNOSIS — I252 Old myocardial infarction: Secondary | ICD-10-CM | POA: Insufficient documentation

## 2016-08-29 DIAGNOSIS — I255 Ischemic cardiomyopathy: Secondary | ICD-10-CM | POA: Diagnosis not present

## 2016-08-29 DIAGNOSIS — Z79899 Other long term (current) drug therapy: Secondary | ICD-10-CM | POA: Insufficient documentation

## 2016-08-29 DIAGNOSIS — I5021 Acute systolic (congestive) heart failure: Secondary | ICD-10-CM

## 2016-08-29 DIAGNOSIS — Z87891 Personal history of nicotine dependence: Secondary | ICD-10-CM | POA: Insufficient documentation

## 2016-08-29 DIAGNOSIS — Z955 Presence of coronary angioplasty implant and graft: Secondary | ICD-10-CM

## 2016-08-29 DIAGNOSIS — R079 Chest pain, unspecified: Secondary | ICD-10-CM

## 2016-08-29 DIAGNOSIS — I25110 Atherosclerotic heart disease of native coronary artery with unstable angina pectoris: Secondary | ICD-10-CM

## 2016-08-29 DIAGNOSIS — I5022 Chronic systolic (congestive) heart failure: Secondary | ICD-10-CM

## 2016-08-29 DIAGNOSIS — I25118 Atherosclerotic heart disease of native coronary artery with other forms of angina pectoris: Secondary | ICD-10-CM

## 2016-08-29 DIAGNOSIS — I2584 Coronary atherosclerosis due to calcified coronary lesion: Secondary | ICD-10-CM | POA: Insufficient documentation

## 2016-08-29 DIAGNOSIS — I2582 Chronic total occlusion of coronary artery: Secondary | ICD-10-CM | POA: Insufficient documentation

## 2016-08-29 HISTORY — DX: Chronic systolic (congestive) heart failure: I50.22

## 2016-08-29 HISTORY — PX: CARDIAC CATHETERIZATION: SHX172

## 2016-08-29 HISTORY — DX: Unstable angina: I20.0

## 2016-08-29 HISTORY — DX: Chest pain, unspecified: R07.9

## 2016-08-29 LAB — CBC
HEMATOCRIT: 36.3 % (ref 36.0–46.0)
Hemoglobin: 12.6 g/dL (ref 12.0–15.0)
MCH: 30.1 pg (ref 26.0–34.0)
MCHC: 34.7 g/dL (ref 30.0–36.0)
MCV: 86.8 fL (ref 78.0–100.0)
Platelets: 256 10*3/uL (ref 150–400)
RBC: 4.18 MIL/uL (ref 3.87–5.11)
RDW: 14 % (ref 11.5–15.5)
WBC: 6.6 10*3/uL (ref 4.0–10.5)

## 2016-08-29 LAB — POCT ACTIVATED CLOTTING TIME
ACTIVATED CLOTTING TIME: 318 s
ACTIVATED CLOTTING TIME: 246 s
ACTIVATED CLOTTING TIME: 158 s
ACTIVATED CLOTTING TIME: 219 s
Activated Clotting Time: 329 seconds
Activated Clotting Time: 208 seconds

## 2016-08-29 LAB — CREATININE, SERUM
Creatinine, Ser: 0.7 mg/dL (ref 0.44–1.00)
GFR calc Af Amer: >60 mL/min (ref >60)
GFR calc non Af Amer: >60 mL/min (ref >60)

## 2016-08-29 LAB — MRSA PCR SCREENING: MRSA by PCR: NEGATIVE

## 2016-08-29 SURGERY — CORONARY ATHERECTOMY
Anesthesia: LOCAL

## 2016-08-29 MED ORDER — IOPAMIDOL (ISOVUE-370) INJECTION 76%
INTRAVENOUS | Status: DC | PRN
  Administered 2016-08-29: 135 mL via INTRA_ARTERIAL

## 2016-08-29 MED ORDER — NITROGLYCERIN 1 MG/10 ML FOR IR/CATH LAB
INTRA_ARTERIAL | Status: DC | PRN
  Administered 2016-08-29: 200 ug
  Administered 2016-08-29: 200 ug via INTRACORONARY

## 2016-08-29 MED ORDER — LABETALOL HCL 5 MG/ML IV SOLN: 10.0000 mg | INTRAVENOUS | Status: AC | PRN

## 2016-08-29 MED ORDER — MIDAZOLAM HCL 2 MG/2ML IJ SOLN
INTRAMUSCULAR | Status: AC
  Filled 2016-08-29: qty 2

## 2016-08-29 MED ORDER — CLOPIDOGREL BISULFATE 75 MG PO TABS
75.0000 mg | ORAL_TABLET | Freq: Every day | ORAL | Status: DC
  Administered 2016-08-30 – 2016-08-31 (×2): 75 mg via ORAL
  Filled 2016-08-29 (×2): qty 1

## 2016-08-29 MED ORDER — ONDANSETRON HCL 4 MG/2ML IJ SOLN
4.0000 mg | Freq: Four times a day (QID) | INTRAMUSCULAR | Status: DC | PRN
  Administered 2016-08-30: 4 mg via INTRAVENOUS
  Filled 2016-08-29: qty 2

## 2016-08-29 MED ORDER — SODIUM CHLORIDE 0.9% FLUSH
3.0000 mL | Freq: Two times a day (BID) | INTRAVENOUS | Status: DC
  Administered 2016-08-29 – 2016-08-31 (×4): 3 mL via INTRAVENOUS

## 2016-08-29 MED ORDER — SODIUM CHLORIDE 0.9% FLUSH: 3.0000 mL | INTRAVENOUS | Status: DC | PRN

## 2016-08-29 MED ORDER — NITROGLYCERIN 1 MG/10 ML FOR IR/CATH LAB
INTRA_ARTERIAL | Status: AC
  Filled 2016-08-29: qty 10

## 2016-08-29 MED ORDER — HEPARIN SODIUM (PORCINE) 1000 UNIT/ML IJ SOLN
INTRAMUSCULAR | Status: AC
  Filled 2016-08-29: qty 1

## 2016-08-29 MED ORDER — ATORVASTATIN CALCIUM 40 MG PO TABS
40.0000 mg | ORAL_TABLET | Freq: Every day | ORAL | Status: DC
  Administered 2016-08-30 – 2016-08-31 (×2): 40 mg via ORAL
  Filled 2016-08-29 (×2): qty 1

## 2016-08-29 MED ORDER — HEPARIN (PORCINE) IN NACL 2-0.9 UNIT/ML-% IJ SOLN
INTRAMUSCULAR | Status: AC
  Filled 2016-08-29: qty 1000

## 2016-08-29 MED ORDER — VERAPAMIL HCL 2.5 MG/ML IV SOLN
INTRAVENOUS | Status: DC | PRN
  Administered 2016-08-29: 10 mL via INTRA_ARTERIAL

## 2016-08-29 MED ORDER — FENTANYL CITRATE (PF) 100 MCG/2ML IJ SOLN
INTRAMUSCULAR | Status: DC | PRN
  Administered 2016-08-29: 50 ug via INTRAVENOUS
  Administered 2016-08-29 (×2): 25 ug via INTRAVENOUS

## 2016-08-29 MED ORDER — SODIUM CHLORIDE 0.9% FLUSH: 3.0000 mL | Freq: Two times a day (BID) | INTRAVENOUS | Status: DC

## 2016-08-29 MED ORDER — SODIUM CHLORIDE 0.9 % IV SOLN
INTRAVENOUS | Status: DC
Start: 2016-08-29 — End: 2016-08-29
  Administered 2016-08-29: 10:00:00 via INTRAVENOUS

## 2016-08-29 MED ORDER — CLOPIDOGREL BISULFATE 300 MG PO TABS
ORAL_TABLET | ORAL | Status: AC
  Filled 2016-08-29: qty 1

## 2016-08-29 MED ORDER — LISINOPRIL 5 MG PO TABS
5.0000 mg | ORAL_TABLET | Freq: Every day | ORAL | Status: DC
  Filled 2016-08-29: qty 1

## 2016-08-29 MED ORDER — HEPARIN (PORCINE) IN NACL 2-0.9 UNIT/ML-% IJ SOLN
INTRAMUSCULAR | Status: DC | PRN
Start: 2016-08-29 — End: 2016-08-29
  Administered 2016-08-29: 1500 mL

## 2016-08-29 MED ORDER — ENOXAPARIN SODIUM 40 MG/0.4ML ~~LOC~~ SOLN
40.0000 mg | SUBCUTANEOUS | Status: DC
  Administered 2016-08-30 – 2016-08-31 (×2): 40 mg via SUBCUTANEOUS
  Filled 2016-08-29 (×2): qty 0.4

## 2016-08-29 MED ORDER — LIDOCAINE HCL (PF) 1 % IJ SOLN
INTRAMUSCULAR | Status: AC
  Filled 2016-08-29: qty 30

## 2016-08-29 MED ORDER — SODIUM CHLORIDE 0.9 % IV SOLN: 250.0000 mL | INTRAVENOUS | Status: DC | PRN

## 2016-08-29 MED ORDER — MIDAZOLAM HCL 2 MG/2ML IJ SOLN
INTRAMUSCULAR | Status: DC | PRN
  Administered 2016-08-29: 0.5 mg via INTRAVENOUS
  Administered 2016-08-29: 1 mg via INTRAVENOUS

## 2016-08-29 MED ORDER — HEPARIN SODIUM (PORCINE) 1000 UNIT/ML IJ SOLN
INTRAMUSCULAR | Status: DC | PRN
  Administered 2016-08-29: 2000 [IU] via INTRAVENOUS
  Administered 2016-08-29: 5000 [IU] via INTRAVENOUS

## 2016-08-29 MED ORDER — LEVOTHYROXINE SODIUM 75 MCG PO TABS
37.5000 ug | ORAL_TABLET | Freq: Every day | ORAL | Status: DC
  Administered 2016-08-30 – 2016-08-31 (×2): 37.5 ug via ORAL
  Filled 2016-08-29 (×2): qty 1

## 2016-08-29 MED ORDER — FENTANYL CITRATE (PF) 100 MCG/2ML IJ SOLN
INTRAMUSCULAR | Status: AC
  Filled 2016-08-29: qty 2

## 2016-08-29 MED ORDER — IOPAMIDOL (ISOVUE-370) INJECTION 76%
INTRAVENOUS | Status: AC
Start: 2016-08-29 — End: 2016-08-29
  Filled 2016-08-29: qty 50

## 2016-08-29 MED ORDER — ASPIRIN 81 MG PO CHEW
81.0000 mg | CHEWABLE_TABLET | Freq: Every day | ORAL | Status: DC
  Administered 2016-08-30 – 2016-08-31 (×2): 81 mg via ORAL
  Filled 2016-08-29 (×2): qty 1

## 2016-08-29 MED ORDER — VERAPAMIL HCL 2.5 MG/ML IV SOLN
INTRAVENOUS | Status: AC
  Filled 2016-08-29: qty 2

## 2016-08-29 MED ORDER — CARVEDILOL 3.125 MG PO TABS
3.1250 mg | ORAL_TABLET | Freq: Two times a day (BID) | ORAL | Status: DC
  Administered 2016-08-29 – 2016-08-31 (×4): 3.125 mg via ORAL
  Filled 2016-08-29 (×4): qty 1

## 2016-08-29 MED ORDER — ACETAMINOPHEN 325 MG PO TABS
650.0000 mg | ORAL_TABLET | ORAL | Status: DC | PRN
  Administered 2016-08-29 – 2016-08-30 (×2): 650 mg via ORAL
  Filled 2016-08-29 (×2): qty 2

## 2016-08-29 MED ORDER — IOPAMIDOL (ISOVUE-370) INJECTION 76%
INTRAVENOUS | Status: AC
  Filled 2016-08-29: qty 125

## 2016-08-29 MED ORDER — CLOPIDOGREL BISULFATE 300 MG PO TABS
600.0000 mg | ORAL_TABLET | ORAL | Status: AC
  Administered 2016-08-29: 600 mg via ORAL

## 2016-08-29 MED ORDER — LIDOCAINE HCL (PF) 1 % IJ SOLN
INTRAMUSCULAR | Status: DC | PRN
  Administered 2016-08-29: 3 mL
  Administered 2016-08-29: 20 mL

## 2016-08-29 MED ORDER — HYDRALAZINE HCL 20 MG/ML IJ SOLN: 5.0000 mg | INTRAMUSCULAR | Status: AC | PRN

## 2016-08-29 MED ORDER — SODIUM CHLORIDE 0.9 % IV SOLN
INTRAVENOUS | Status: AC
  Administered 2016-08-29: 17:00:00 via INTRAVENOUS

## 2016-08-29 MED ORDER — ASPIRIN 81 MG PO CHEW: 81.0000 mg | CHEWABLE_TABLET | ORAL | Status: DC

## 2016-08-29 SURGICAL SUPPLY — 28 items
BALLN MAVERICK OTW 1.5X15 (BALLOONS) ×2
BALLN ~~LOC~~ EUPHORA RX 3.25X20 (BALLOONS) ×2
BALLOON MAVERICK OTW 1.5X15 (BALLOONS) ×1 IMPLANT
BALLOON ~~LOC~~ EUPHORA RX 3.25X20 (BALLOONS) ×1 IMPLANT
CATH EXPO 5FR FR4 (CATHETERS) ×2 IMPLANT
CATH OPTICROSS 40MHZ (CATHETERS) ×2 IMPLANT
CATH VISTA GUIDE 6FR XB3 (CATHETERS) ×2 IMPLANT
CROWN DIAMONDBACK CLASSIC 1.25 (BURR) ×2 IMPLANT
DEVICE RAD COMP TR BAND LRG (VASCULAR PRODUCTS) ×2 IMPLANT
GLIDESHEATH SLEND SS 6F .021 (SHEATH) ×2 IMPLANT
GUIDEWIRE INQWIRE 1.5J.035X260 (WIRE) ×1 IMPLANT
INQWIRE 1.5J .035X260CM (WIRE) ×2
KIT ENCORE 26 ADVANTAGE (KITS) ×4 IMPLANT
KIT HEART LEFT (KITS) ×2 IMPLANT
LUBRICANT VIPERSLIDE CORONARY (MISCELLANEOUS) ×4 IMPLANT
PACK CARDIAC CATHETERIZATION (CUSTOM PROCEDURE TRAY) ×2 IMPLANT
SET INTRODUCER MICROPUNCT 5F (INTRODUCER) ×2 IMPLANT
SHEATH PINNACLE 6F 10CM (SHEATH) ×2 IMPLANT
SLED PULL BACK IVUS (MISCELLANEOUS) ×2 IMPLANT
STENT SYNERGY DES 3X32 (Permanent Stent) ×2 IMPLANT
TRANSDUCER W/STOPCOCK (MISCELLANEOUS) ×2 IMPLANT
TUBING CIL FLEX 10 FLL-RA (TUBING) ×2 IMPLANT
VALVE GUARDIAN II ~~LOC~~ HEMO (MISCELLANEOUS) ×2 IMPLANT
WIRE EMERALD 3MM-J .035X150CM (WIRE) ×2 IMPLANT
WIRE HI TORQ VERSACORE-J 145CM (WIRE) ×2 IMPLANT
WIRE RUNTHROUGH .014X180CM (WIRE) ×2 IMPLANT
WIRE RUNTHROUGH EXTENSION (WIRE) ×2 IMPLANT
WIRE VIPER ADVANCE COR .012TIP (WIRE) ×2 IMPLANT

## 2016-08-29 NOTE — Brief Op Note (Signed)
Brief Cardiac Catheterization Note (Full Report to Follow)  Date: 08/29/2016 Time: 3:32 PM  PATIENT:  Pam [name]  66 y.o. female  PRE-OPERATIVE DIAGNOSIS:  Coronary artery disease, unstable angina, systolic heart failure, ischemic cardiomyopathy  POST-OPERATIVE DIAGNOSIS:  Same  PROCEDURE:  Procedure(s): Coronary Atherectomy (N/A) Coronary Stent Intervention (N/A)  SURGEON:  Surgeon(s) and Role:    * [name], MD - Primary  FINDINGS: 1.  Chronic total occlusion of mid LAD. 2.  Heavily calcified 80% stenosis of proximal LCx/OM1. 3.  Normal left ventricular filling pressure. 4.  Small right radial artery with significant spasm, precluding advancement of guide catheter. 5.  Successful orbital atherectomy and IVUS-guided PCI of proximal LCx/OM1 with placement of Synergy 3.0 x 32 mm drug-eluting stent with 0% residual stenosis and TIMI-3 flow.  RECOMMENDATIONS: 1.  Admit for overnight post-cath observation. 2.  Continue dual antiplatelet therapy with aspirin and clopidogrel for at least 12 months. 3.  Aggressive secondary prevention and medical therapy of chronic systolic heart failure. 4.  Remove right femoral artery sheath when ACT < 175 seconds.  [name], MD [facility] Pager: [phone]

## 2016-08-29 NOTE — Interval H&P Note (Signed)
History and Physical Interval Note:  08/29/2016 1:17 PM  Analei Dorian  has presented today for cardiac catheterization, with the diagnosis of chest pain, shortness of breath, known coronary artery disease, and systolic heart failure. The various methods of treatment have been discussed with the patient and family. After consideration of risks, benefits and other options for treatment, the patient has consented to  Procedure(s): Coronary Atherectomy (N/A) as a surgical intervention .  The patient's history has been reviewed, patient examined, no change in status, stable for surgery.  I have reviewed the patient's chart and labs.  Questions were answered to the patient's satisfaction.    Cath Lab Visit (complete for each Cath Lab visit)  Clinical Evaluation Leading to the Procedure:   ACS: No.  Non-ACS:    Anginal Classification: CCS III  Anti-ischemic medical therapy: Minimal Therapy (1 class of medications)  Non-Invasive Test Results: High-risk stress test findings: cardiac mortality >3%/year  Prior CABG: No previous CABG  Jourden Gilson

## 2016-08-29 NOTE — H&P (View-Only) (Signed)
Follow-up Outpatient Visit Date: 08/25/2016  Primary Care Provider: Libby Maw, MD [address] [place] [address]  Chief Complaint: Chest pain, shortness of breath, and dizziness  HPI:  Ms. Holk is a 66 y.o. year-old female with history of coronary artery disease, ischemic cardiomyopathy, hyperlipidemia, and prior tobacco use, who presents for follow-up after NSTEMI last month. The patient presented after a mechanical fall during which she fell forward while taking out her trash, striking her face and for head on her driveway. She had difficulty getting up and was ultimately brought to the emergency department by EMS. EKG demonstrated subtle anterolateral ST elevations, though the patient was chest pain-free. Cardiac catheterization was initially deferred but subsequently performed due to mild troponin elevation, which peaked at 0.27. Catheterization revealed chronic total occlusion of the mid LAD as well as high-grade disease of the proximal LCx. Cardiac MRI revealed anterior and apical scar without viable myocardium and an LVEF of 38%. Due to her lack of cardiac symptoms and recent fall with multiple facial abrasions and lacerations, the decision was made to defer intervention to the LCx.  Today, the patient reports that she has been feeling relatively well. She has noticeable fatigue as well as exertional dyspnea. She also endorses orthostatic lightheadedness and occasional sharp, substernal chest pains lasting a few minutes at a time. These can happen both at rest and with exertion. One particularly significant episode occurred while lying in bed last week. She noticed dyspnea and chest pain lasting several minutes. Upon standing, she was lightheaded. She denies palpitations, as well as falls and syncope. She notes 4 pillow orthopnea since leaving the hospital but no leg edema. She limits her salt intake but tries to stay well-hydrated. She is compliant with her medications,  including aspirin, atorvastatin, and carvedilol. She happily reports today that she has been abstinent from tobacco and alcohol since leaving the hospital. She has noted some fluctuations in her weight, which she attributes to appetite changes.  --------------------------------------------------------------------------------------------------  Cardiovascular History & Procedures: Cardiovascular Problems:  Coronary artery disease with NSTEMI and chronic total occlusion of the proximal/mid LAD  Ischemic cardiomyopathy  Risk Factors:  Known coronary artery disease, hyperlipidemia, tobacco use, age  Cath/PCI:  LHC (07/08/16): Significant two-vessel coronary artery disease including totally calcified chronic total occlusion of the proximal/mid LAD and calcified 80% stenosis of proximal LCx/OM one. LVEF 30% with anterior, apical, and apical inferior disc low normal LVEDP (4 mmHg).  CV Surgery:  None  EP Procedures and Devices:  None  Non-Invasive Evaluation(s):  Cardiac MRI (07/11/16): Normal LV size and wall thickness with moderately reduced contraction (EF 38%). Akinesis/dyskinesis of the mid and apical anterior/anteroseptal and apical segments. LAD territory is nonviable. RCA and LCx territories are viable. Normal RV size and function. Normal biatrial size.  Transthoracic echocardiogram (07/08/16): Normal LV size and wall thickness with LVEF of 35-40%. Anterior and apical wall motion abnormality present. Grade 1 diastolic dysfunction. Normal RV size and function. No significant valvular abnormalities.  Recent CV Pertinent Labs: Lab Results  Component Value Date   CHOL 174 07/07/2016   HDL 87 07/07/2016   LDLCALC 70 07/07/2016   TRIG 87 07/07/2016   CHOLHDL 2.0 07/07/2016   INR 1.04 07/07/2016   K 4.4 07/11/2016   BUN 8 07/11/2016   CREATININE 0.76 07/11/2016    Past medical and surgical history were reviewed and updated in EPIC.  Outpatient Encounter Prescriptions as of  08/25/2016  Medication Sig  . aspirin 81 MG chewable  tablet Chew 1 tablet (81 mg total) by mouth daily.  Marland Kitchen atorvastatin (LIPITOR) 40 MG tablet Take 1 tablet (40 mg total) by mouth daily.  . carvedilol (COREG) 3.125 MG tablet Take 1 tablet (3.125 mg total) by mouth 2 (two) times daily with a meal.  . levothyroxine (SYNTHROID, LEVOTHROID) 25 MCG tablet Take 1 tablet (25 mcg total) by mouth daily before breakfast.   No facility-administered encounter medications on file as of 08/25/2016.     Allergies: Patient has no known allergies.  Social History   Social History  . Marital status: Married    Spouse name: N/A  . Number of children: 2  . Years of education: N/A   Occupational History  . Retired-worked for post office    Social History Main Topics  . Smoking status: Former Smoker    Packs/day: 1.00    Years: 50.00    Types: Cigarettes    Quit date: 07/07/2016  . Smokeless tobacco: Never Used  . Alcohol use Yes     Comment: she drinks 2-3 days per week  . Drug use: No  . Sexual activity: Not on file   Other Topics Concern  . Not on file   Social History Narrative  . No narrative on file    Family History  Problem Relation Age of Onset  . Kidney failure Mother     Review of Systems: A 12-system review of systems was performed and was negative except as noted in the HPI.  --------------------------------------------------------------------------------------------------  Physical Exam: BP 140/78 (BP Location: Left Arm, Patient Position: Sitting, Cuff Size: Normal)   Pulse 80   Ht 5\' 3"  (1.6 m)   Wt 125 lb (56.7 kg)   BMI 22.14 kg/m   General:  Well-developed, well-nourished woman seated comfortably in the exam room. HEENT: No conjunctival pallor or scleral icterus.  Moist mucous membranes.  OP clear. Bruising under the right eye is present. Neck: Supple without lymphadenopathy, thyromegaly, JVD, or HJR.  No carotid bruit. Lungs: Normal work of breathing.  Clear  to auscultation bilaterally without wheezes or crackles. Heart: Regular rate and rhythm without murmurs, rubs, or gallops.  Non-displaced PMI. Abd: Bowel sounds present.  Soft, NT/ND without hepatosplenomegaly Ext: No lower extremity edema.  Radial, PT, and DP pulses are 2+ bilaterally. Right radial arteriotomy site is well-healed with normal Allen's test. Skin: warm and dry; healing abrasions along face and both hands.  EKG:  Normal sinus rhythm with biphasic T waves in V2 and V3 as well as T-wave inversions in 4 and V5. Compared with prior tracing from 07/08/16, anterior T-wave changes are more pronounced. (I have personally reviewed both tracings).  Lab Results  Component Value Date   WBC 8.9 07/11/2016   HGB 13.0 07/11/2016   HCT 38.9 07/11/2016   MCV 90.0 07/11/2016   PLT 236 07/11/2016    Lab Results  Component Value Date   NA 135 07/11/2016   K 4.4 07/11/2016   CL 102 07/11/2016   CO2 22 07/11/2016   BUN 8 07/11/2016   CREATININE 0.76 07/11/2016   GLUCOSE 104 (H) 07/11/2016   ALT 27 07/07/2016    Lab Results  Component Value Date   CHOL 174 07/07/2016   HDL 87 07/07/2016   LDLCALC 70 07/07/2016   TRIG 87 07/07/2016   CHOLHDL 2.0 07/07/2016    --------------------------------------------------------------------------------------------------  ASSESSMENT AND PLAN: Coronary artery disease Patient found to have significant two-vessel CAD including CTO of the proximal/mid LAD with anterior scar. She also  has significant disease involving proximal LCx and OM branch. Given that she has significant exertional dyspnea and intermittent chest pain, we have agreed to proceed with cardiac catheterization and planned PCI to the LCx. Given heavy calcification of the lesion, we will plan to use orbital atherectomy. I have reviewed the risks, indications, and alternatives to cardiac catheterization, possible angioplasty, and stenting with the patient. Risks include but are not limited  to bleeding, infection, vascular injury, stroke, myocardial infection, arrhythmia, kidney injury, radiation-related injury in the case of prolonged fluoroscopy use, emergency cardiac surgery, and death. The patient understands the risks of serious complication is 1-2 in 123XX123 with diagnostic cardiac cath and 1-2% or less with angioplasty/stenting. In the meantime, the patient should remain on low-dose aspirin and atorvastatin. We will check precath labs, including basic metabolic panel, CBC, and PT/INR today.  Ischemic cardiomyopathy with chronic systolic heart failure Moderately reduced LV function noted by echo and cardiac MRI. She appears euvolemic on exam today, though she notes orthopnea and exertional dyspnea. She was noted to have a low LVEDP at the time of cardiac catheterization last month. We will continue with carvedilol and add lisinopril 5 mg daily.  Dizziness This is predominantly positional suggestive of orthostatic lightheadedness. Orthostatic vital signs today demonstrate borderline blood pressure and heart rate response, though overall her standing blood pressure is still elevated. I suspect some of her symptoms may be related to concussion at the time of her fall last month. Of note, head CT at that time showed no intracranial abnormality. Another consideration would be transient arrhythmia, given her cardiomyopathy and extensive LV scar. After catheterization, we will consider placement of an event monitor to evaluate for arrhythmias. I encouraged the patient to remain well-hydrated but continue with salt restriction due to her heart and myopathy.  Follow-up: Return to clinic in one month.  [name], MD 08/25/2016 4:03 PM

## 2016-08-30 ENCOUNTER — Encounter (HOSPITAL_COMMUNITY): Payer: Self-pay | Admitting: Internal Medicine

## 2016-08-30 DIAGNOSIS — I200 Unstable angina: Secondary | ICD-10-CM

## 2016-08-30 DIAGNOSIS — I25110 Atherosclerotic heart disease of native coronary artery with unstable angina pectoris: Secondary | ICD-10-CM | POA: Diagnosis not present

## 2016-08-30 DIAGNOSIS — I5022 Chronic systolic (congestive) heart failure: Secondary | ICD-10-CM

## 2016-08-30 DIAGNOSIS — I2582 Chronic total occlusion of coronary artery: Secondary | ICD-10-CM | POA: Diagnosis not present

## 2016-08-30 DIAGNOSIS — I255 Ischemic cardiomyopathy: Secondary | ICD-10-CM | POA: Diagnosis not present

## 2016-08-30 DIAGNOSIS — I209 Angina pectoris, unspecified: Secondary | ICD-10-CM

## 2016-08-30 DIAGNOSIS — E782 Mixed hyperlipidemia: Secondary | ICD-10-CM

## 2016-08-30 LAB — BASIC METABOLIC PANEL
Anion gap: 6 (ref 5–15)
BUN: 10 mg/dL (ref 6–20)
CHLORIDE: 102 mmol/L (ref 101–111)
CO2: 26 mmol/L (ref 22–32)
Calcium: 9.1 mg/dL (ref 8.9–10.3)
Creatinine, Ser: 0.67 mg/dL (ref 0.44–1.00)
GFR calc Af Amer: >60 mL/min (ref >60)
GLUCOSE: 109 mg/dL — AB (ref 65–99)
Potassium: 3.8 mmol/L (ref 3.5–5.1)
Sodium: 134 mmol/L — ABNORMAL LOW (ref 135–145)

## 2016-08-30 LAB — CBC
HCT: 33.8 % — ABNORMAL LOW (ref 36.0–46.0)
Hemoglobin: 11.5 g/dL — ABNORMAL LOW (ref 12.0–15.0)
MCH: 29.9 pg (ref 26.0–34.0)
MCHC: 34 g/dL (ref 30.0–36.0)
MCV: 87.8 fL (ref 78.0–100.0)
PLATELETS: 219 10*3/uL (ref 150–400)
RBC: 3.85 MIL/uL — AB (ref 3.87–5.11)
RDW: 14 % (ref 11.5–15.5)
WBC: 7.3 10*3/uL (ref 4.0–10.5)

## 2016-08-30 MED ORDER — LISINOPRIL 2.5 MG PO TABS
2.5000 mg | ORAL_TABLET | Freq: Every day | ORAL | Status: DC
  Administered 2016-08-31: 2.5 mg via ORAL
  Filled 2016-08-30: qty 1

## 2016-08-30 NOTE — Progress Notes (Signed)
Patient Name: Pam [name] Date of Encounter: 08/30/2016  Principal Problem:   Chest pain Active Problems:   Chronic systolic heart failure ([place])   Cardiomyopathy, ischemic   Unstable angina ([place])   Length of Stay: 0  SUBJECTIVE  The patient feels nauseated and weak this morning, some right groin pain.  CURRENT MEDS . aspirin  81 mg Oral Daily  . atorvastatin  40 mg Oral Daily  . carvedilol  3.125 mg Oral BID WC  . clopidogrel  75 mg Oral Q breakfast  . enoxaparin (LOVENOX) injection  40 mg Subcutaneous Q24H  . levothyroxine  37.5 mcg Oral QAC breakfast  . lisinopril  5 mg Oral Daily  . sodium chloride flush  3 mL Intravenous Q12H    OBJECTIVE  Vitals:   08/30/16 0400 08/30/16 0500 08/30/16 0600 08/30/16 0700  BP: (!) 88/66 (!) 99/51 (!) 116/49 (!) 89/50  Pulse: 66 (!) 59 63 68  Resp: 16 13 12 16   Temp:    98.7 F (37.1 C)  TempSrc:    Oral  SpO2: 94% 94% 94% 95%  Weight:      Height:        Intake/Output Summary (Last 24 hours) at 08/30/16 0858 Last data filed at 08/29/16 2100  Gross per 24 hour  Intake           819.17 ml  Output                0 ml  Net           819.17 ml   Filed Weights   08/29/16 0936 08/29/16 1645  Weight: 125 lb (56.7 kg) 125 lb 1.6 oz (56.7 kg)   PHYSICAL EXAM  General: Pleasant, NAD. Neuro: Alert and oriented X 3. Moves all extremities spontaneously. Psych: Normal affect. HEENT:  Normal  Neck: Supple without bruits or JVD. Lungs:  Resp regular and unlabored, CTA. Heart: RRR no s3, s4, or murmurs. Abdomen: Soft, non-tender, non-distended, BS + x 4.  Extremities: No clubbing, cyanosis or edema. DP/PT/Radials 1+ and equal bilaterally. Mild bruise in the right inguinal insertion site, no bruit.   Accessory Clinical Findings  CBC  Recent Labs  08/29/16 1637 08/30/16 0240  WBC 6.6 7.3  HGB 12.6 11.5*  HCT 36.3 33.8*  MCV 86.8 87.8  PLT 256 [id number]   Basic Metabolic Panel  Recent Labs  08/29/16 1637 08/30/16 0240    NA  --  134*  K  --  3.8  CL  --  102  CO2  --  26  GLUCOSE  --  109*  BUN  --  10  CREATININE 0.70 0.67  CALCIUM  --  9.1   TELE: SR  LHC: 08/29/16 FINDINGS: 1.  Chronic total occlusion of mid LAD. 2.  Heavily calcified 80% stenosis of proximal LCx/OM1. 3.  Normal left ventricular filling pressure. 4.  Small right radial artery with significant spasm, precluding advancement of guide catheter. 5.  Successful orbital atherectomy and IVUS-guided PCI of proximal LCx/OM1 with placement of Synergy 3.0 x 32 mm drug-eluting stent with 0% residual stenosis and TIMI-3 flow.  RECOMMENDATIONS: 1.  Admit for overnight post-cath observation. 2.  Continue dual antiplatelet therapy with aspirin and clopidogrel for at least 12 months. 3.  Aggressive secondary prevention and medical therapy of chronic systolic heart failure. 4.  Remove right femoral artery sheath when ACT < 175 seconds.  TTE: 07/2016 - Left ventricle: The cavity size was normal. Wall thickness was  normal. Systolic function was moderately reduced. The estimated   ejection fraction was in the range of 35% to 40%. There is   akinesis of the basal and mid anteroseptal, apical anterior and   septal walls and hypokinesis of the anterior and anterolateral   walls. Doppler parameters are consistent with abnormal left   ventricular relaxation (grade 1 diastolic dysfunction). Doppler   parameters are consistent with elevated ventricular end-diastolic   filling pressure. - Aortic valve: Trileaflet; normal thickness leaflets. - Aortic root: The aortic root was normal in size. - Ascending aorta: The ascending aorta was normal in size. - Mitral valve: There was trivial regurgitation. - Left atrium: The atrium was normal in size. - Right ventricle: Systolic function was normal. - Right atrium: The atrium was normal in size. - Atrial septum: No defect or patent foramen ovale was identified. - Tricuspid valve: There was mild  regurgitation. - Pulmonary arteries: Systolic pressure was within the normal   range. - Pericardium, extracardiac: There was no pericardial effusion.  Impressions:  - LVEF is moderately reduced, the findings are consistent with CAD   in the LAD and possibly LM territory.   ASSESSMENT AND PLAN  Ms. Ayler is a 66 y.o. year-old female with history of coronary artery disease, ischemic cardiomyopathy, hyperlipidemia, and prior tobacco use, who presents for follow-up after NSTEMI last month. Cardiac MRI revealed anterior and apical scar without viable myocardium and an LVEF of 38%.   1. CAD,  Chronic total occlusion of mid LAD. Heavily calcified 80% stenosis of proximal LCx/OM1. Normal left ventricular filling pressure. Small right radial artery with significant spasm, precluding advancement of guide catheter. Successful orbital atherectomy and IVUS-guided PCI of proximal LCx/OM1 with placement of Synergy 3.0 x 32 mm drug-eluting stent with 0% residual stenosis and TIMI-3 flow. - continue low dose carvedilol, lisinopril - decrease to 2.5 mg po daily as she is hypotensive, atorvastatin, aspirin and plavix.  2. Ischemic cardiomyopathy - as above, LVEF 35-40% with viable myocardium in the RCA and LCX territory, expected improvement of LVEF after stenting, recheck TTE in 6-8 weeks.  3. HLP - onatorvastatin  Start regular food, start mobilizing, plan for a discharge tomorrow.   Signed, Ena Dawley MD, [facility] 08/30/2016

## 2016-08-30 NOTE — Progress Notes (Signed)
CARDIAC REHAB PHASE I   PRE:  Rate/Rhythm: 76 SR  BP:  Sitting: 111/53        SaO2: 96 RA  MODE:  Ambulation: 300 ft   POST:  Rate/Rhythm: 93 SR  BP:  Sitting: 120/71         SaO2: 96 RA  Pt ambulated 300 ft on RA, handheld assist, steady gait, tolerated fairly well.  Pt c/o mild DOE, feeling "a little light headed," denies cp, brief standing rest x1. Completed PCI/stent education.  Reviewed risk factors, anti-platelet therapy, stent card, activity restrictions, ntg, exercise, heart healthy diet, sodium restrictions, CHF booklet and zone tool, daily weights and phase 2 cardiac rehab. Pt verbalized understanding, receptive to education. Pt agrees to phase 2 cardiac rehab referral, will send to [facility] per pt request. Pt to recliner after walk, call bell within reach. Will follow.    [id number]  Lenna Sciara, RN, BSN 08/30/2016 12:04 PM

## 2016-08-31 DIAGNOSIS — I255 Ischemic cardiomyopathy: Secondary | ICD-10-CM | POA: Diagnosis not present

## 2016-08-31 DIAGNOSIS — I209 Angina pectoris, unspecified: Secondary | ICD-10-CM | POA: Diagnosis not present

## 2016-08-31 DIAGNOSIS — I200 Unstable angina: Secondary | ICD-10-CM | POA: Diagnosis not present

## 2016-08-31 DIAGNOSIS — I25110 Atherosclerotic heart disease of native coronary artery with unstable angina pectoris: Secondary | ICD-10-CM | POA: Diagnosis not present

## 2016-08-31 DIAGNOSIS — I5022 Chronic systolic (congestive) heart failure: Secondary | ICD-10-CM | POA: Diagnosis not present

## 2016-08-31 MED ORDER — LISINOPRIL 5 MG PO TABS: 2.5000 mg | ORAL_TABLET | Freq: Every day | ORAL | 1 refills | Status: DC

## 2016-08-31 MED ORDER — CLOPIDOGREL BISULFATE 75 MG PO TABS: 75.0000 mg | ORAL_TABLET | Freq: Every day | ORAL | 11 refills | Status: DC

## 2016-08-31 NOTE — Discharge Instructions (Signed)
Clopidogrel tablets What is this medicine? CLOPIDOGREL (kloh PID oh grel) helps to prevent blood clots. This medicine is used to prevent heart attack, stroke, or other vascular events in people who are at high risk. This medicine may be used for other purposes; ask your health care provider or pharmacist if you have questions. COMMON BRAND NAME(S): Plavix What should I tell my health care provider before I take this medicine? They need to know if you have any of the following conditions: -bleeding disorders -bleeding in the brain -having surgery -history of stomach bleeding -an unusual or allergic reaction to clopidogrel, other medicines, foods, dyes, or preservatives -pregnant or trying to get pregnant -breast-feeding How should I use this medicine? Take this medicine by mouth with a glass of water. Follow the directions on the prescription label. You may take this medicine with or without food. If it upsets your stomach, take it with food. Take your medicine at regular intervals. Do not take it more often than directed. Do not stop taking except on your doctor's advice. A special MedGuide will be given to you by the pharmacist with each prescription and refill. Be sure to read this information carefully each time. Talk to your pediatrician regarding the use of this medicine in children. Special care may be needed. Overdosage: If you think you have taken too much of this medicine contact a poison control center or emergency room at once. NOTE: This medicine is only for you. Do not share this medicine with others. What if I miss a dose? If you miss a dose, take it as soon as you can. If it is almost time for your next dose, take only that dose. Do not take double or extra doses. What may interact with this medicine? Do not take this medicine with the following medications: -dasabuvir; ombitasvir; paritaprevir; ritonavir -defibrotide This medicine may also interact with the following  medications: -antiviral medicines for HIV or AIDS -aspirin -certain medicines for depression like citalopram, fluoxetine, fluvoxamine -certain medicines for fungal infections like ketoconazole, fluconazole, voriconazole -certain medicines for seizures like felbamate, oxcarbazepine, phenytoin -certain medicines for stomach problems like cimetidine, omeprazole, esomeprazole -certain medicines that treat or prevent blood clots like warfarin, enoxaparin, dalteparin, apixaban, dabigatran, rivaroxaban, ticlopidine -chloramphenicol -cilostazol -fluvastatin -isoniazid -modafinil -nicardipine -NSAIDS, medicines for pain and inflammation, like ibuprofen or naproxen -quinine -repaglinide -tamoxifen -tolbutamide -topiramate -torsemide This list may not describe all possible interactions. Give your health care provider a list of all the medicines, herbs, non-prescription drugs, or dietary supplements you use. Also tell them if you smoke, drink alcohol, or use illegal drugs. Some items may interact with your medicine. What should I watch for while using this medicine? Visit your doctor or health care professional for regular check ups. Do not stop taking your medicine unless your doctor tells you to. Notify your doctor or health care professional and seek emergency treatment if you develop breathing problems; changes in vision; chest pain; severe, sudden headache; pain, swelling, warmth in the leg; trouble speaking; sudden numbness or weakness of the face, arm or leg. These can be signs that your condition has gotten worse. If you are going to have surgery or dental work, tell your doctor or health care professional that you are taking this medicine. Certain genetic factors may reduce the effect of this medicine. Your doctor may use genetic tests to determine treatment. What side effects may I notice from receiving this medicine? Side effects that you should report to your doctor or health care  professional as soon as possible: -allergic reactions like skin rash, itching or hives, swelling of the face, lips, or tongue -signs and symptoms of bleeding such as bloody or black, tarry stools; red or dark-brown urine; spitting up blood or brown material that looks like coffee grounds; red spots on the skin; unusual bruising or bleeding from the eye, gums, or nose -signs and symptoms of a blood clot such as breathing problems; changes in vision; chest pain; severe, sudden headache; pain, swelling, warmth in the leg; trouble speaking; sudden numbness or weakness of the face, arm or leg Side effects that usually do not require medical attention (report to your doctor or health care professional if they continue or are bothersome): -constipation -diarrhea -headache -upset stomach This list may not describe all possible side effects. Call your doctor for medical advice about side effects. You may report side effects to FDA at 1-800-FDA-1088. Where should I keep my medicine? Keep out of the reach of children. Store at room temperature of 59 to 86 degrees F (15 to 30 degrees C). Throw away any unused medicine after the expiration date. NOTE: This sheet is a summary. It may not cover all possible information. If you have questions about this medicine, talk to your doctor, pharmacist, or health care provider.  2017 Elsevier/Gold Standard (2015-04-23 10:00:44) Coronary Angiogram With Stent Coronary angiogram with stent placement is a procedure to widen or open a narrow blood vessel of the heart (coronary artery). Arteries may become blocked by cholesterol buildup (plaques) in the lining or wall. When a coronary artery becomes partially blocked, blood flow to that area decreases. This may lead to chest pain or a heart attack (myocardial infarction). A stent is a small piece of metal that looks like mesh or a spring. Stent placement may be done as treatment for a heart attack or right after a coronary  angiogram in which a blocked artery is found. Let your health care provider know about:  Any allergies you have.  All medicines you are taking, including vitamins, herbs, eye drops, creams, and over-the-counter medicines.  Any problems you or family members have had with anesthetic medicines.  Any blood disorders you have.  Any surgeries you have had.  Any medical conditions you have.  Whether you are pregnant or may be pregnant. What are the risks? Generally, this is a safe procedure. However, problems may occur, including:  Damage to the heart or its blood vessels.  A return of blockage.  Bleeding, infection, or bruising at the insertion site.  A collection of blood under the skin (hematoma) at the insertion site.  A blood clot in another part of the body.  Kidney injury.  Allergic reaction to the dye or contrast that is used.  Bleeding into the abdomen (retroperitoneal bleeding). What happens before the procedure? Staying hydrated  Follow instructions from your health care provider about hydration, which may include:  Up to 2 hours before the procedure - you may continue to drink clear liquids, such as water, clear fruit juice, black coffee, and plain tea. Eating and drinking restrictions  Follow instructions from your health care provider about eating and drinking, which may include:  8 hours before the procedure - stop eating heavy meals or foods such as meat, fried foods, or fatty foods.  6 hours before the procedure - stop eating light meals or foods, such as toast or cereal.  2 hours before the procedure - stop drinking clear liquids. Ask your health care provider about:  Changing or stopping your regular medicines. This is especially important if you are taking diabetes medicines or blood thinners.  Taking medicines such as ibuprofen. These medicines can thin your blood. Do not take these medicines before your procedure if your health care provider  instructs you not to. Generally, aspirin is recommended before a procedure of passing a small, thin tube (catheter) through a blood vessel and into the heart (cardiac catheterization). What happens during the procedure?  An IV tube will be inserted into one of your veins.  You will be given one or more of the following:  A medicine to help you relax (sedative).  A medicine to numb the area where the catheter will be inserted into an artery (local anesthetic).  To reduce your risk of infection:  Your health care team will wash or sanitize their hands.  Your skin will be washed with soap.  Hair may be removed from the area where the catheter will be inserted.  Using a guide wire, the catheter will be inserted into an artery. The location may be in your groin, in your wrist, or in the fold of your arm (near your elbow).  A type of X-ray (fluoroscopy) will be used to help guide the catheter to the opening of the arteries in the heart.  A dye will be injected into the catheter, and X-rays will be taken. The dye will help to show where any narrowing or blockages are located in the arteries.  A tiny wire will be guided to the blocked spot, and a balloon will be inflated to make the artery wider.  The stent will be expanded and will crush the plaques into the wall of the vessel. The stent will hold the area open and improve the blood flow. Most stents have a drug coating to reduce the risk of the stent narrowing over time.  The artery may be made wider using a drill, laser, or other tools to remove plaques.  When the blood flow is better, the catheter will be removed. The lining of the artery will grow over the stent, which stays where it was placed. This procedure may vary among health care providers and hospitals. What happens after the procedure?  If the procedure is done through the leg, you will be kept in bed lying flat for about 6 hours. You will be instructed to not bend and not  cross your legs.  The insertion site will be checked frequently.  The pulse in your foot or wrist will be checked frequently.  You may have additional blood tests, X-rays, and a test that records the electrical activity of your heart (electrocardiogram, or ECG). This information is not intended to replace advice given to you by your health care provider. Make sure you discuss any questions you have with your health care provider. Document Released: 01/22/2003 Document Revised: 03/17/2016 Document Reviewed: 02/21/2016 Elsevier Interactive Patient Education  2017 [employer].

## 2016-08-31 NOTE — Discharge Summary (Signed)
Discharge Summary    Patient ID: Pam [name],  MRN: [id number], DOB/AGE: Dec 07, 1950 66 y.o.  Admit date: 08/29/2016 Discharge date: 08/31/2016  Primary Care Provider: Libby [name] Primary Cardiologist: Dr. Saunders Revel  Discharge Diagnoses    Principal Problem:   Chest pain Active Problems:   Chronic systolic heart failure ([facility])   Cardiomyopathy, ischemic   Unstable angina ([facility])   Allergies No Known Allergies  Diagnostic Studies/Procedures    LHC: 08/29/16 FINDINGS: 1. Chronic total occlusion of mid LAD. 2. Heavily calcified 80% stenosis of proximal LCx/OM1. 3. Normal left ventricular filling pressure. 4. Small right radial artery with significant spasm, precluding advancement of guide catheter. 5. Successful orbital atherectomy and IVUS-guided PCI of proximal LCx/OM1 with placement of Synergy 3.0 x 32 mm drug-eluting stent with 0% residual stenosis and TIMI-3 flow.  RECOMMENDATIONS: 1. Admit for overnight post-cath observation. 2. Continue dual antiplatelet therapy with aspirin and clopidogrel for at least 12 months. 3. Aggressive secondary prevention and medical therapy of chronic systolic heart failure. 4. Remove right femoral artery sheath when ACT <175 seconds.   History of Present Illness     Pam [name] is a 66 y.o. year-old female with history of coronary artery disease, ischemic cardiomyopathy, hyperlipidemia,  prior tobacco use and recent NSTEMI presented for cath.   The patient presented after a mechanical fall during which she fell forward while taking out her trash, striking her face and for head on her driveway. She had difficulty getting up and was ultimately brought to the emergency department by EMS. EKG demonstrated subtle anterolateral ST elevations, though the patient was chest pain-free. Cardiac catheterization was initially deferred but subsequently performed due to mild troponin elevation, which peaked at 0.27. Catheterization revealed  chronic total occlusion of the mid LAD as well as high-grade disease of the proximal LCx. Cardiac MRI revealed anterior and apical scar without viable myocardium and an LVEF of 38%. Due to her lack of cardiac symptoms and recent fall with multiple facial abrasions and lacerations, the decision was made to defer intervention to the LCx.  Seen in clinic by Dr. Saunders Revel 08/25/16 for post hospital f/u. Had dizziness. This is predominantly positional suggestive of orthostatic lightheadedness. Orthostatic vital signs  demonstrate borderline blood pressure and heart rate response, though overall her standing blood pressure is still elevated.  Suspected some of her symptoms may be related to concussion at the time of her fall last month.  She is scheduled for orbital atherectomy  Hospital Course     Consultants: None  She is s/p successful orbital atherectomy and IVUS-guided PCI of proximal LCx/OM1 with placement of Synergy 3.0 x 32 mm drug-eluting stent with 0% residual stenosis and TIMI-3 flow. Continue low dose carvedilol, lisinopril 2.5 mg po daily, atorvastatin, aspirin and plavix. Her blood pressure was low initially that improved with resumption of meal. Reduced lisinopril dose. Ambulated well. Bp has been normalized. Will need close outpatient follow up. Will need echo in 3 months to reevaluate LV function.   The patient has been seen by Dr.Nelson  today and deemed ready for discharge home. All follow-up appointments have been scheduled. Discharge medications are listed below.  _____________   Discharge Vitals Blood pressure 103/63, pulse 72, temperature 98.1 F (36.7 C), temperature source Oral, resp. rate 15, height 5\' 3"  (1.6 m), weight 125 lb 1.6 oz (56.7 kg), SpO2 96 %.  Filed Weights   08/29/16 0936 08/29/16 1645  Weight: 125 lb (56.7 kg) 125 lb 1.6 oz (56.7 kg)  Labs & Radiologic Studies     CBC  Recent Labs  08/29/16 1637 08/30/16 0240  WBC 6.6 7.3  HGB 12.6 11.5*  HCT 36.3  33.8*  MCV 86.8 87.8  PLT 256 [id number]   Basic Metabolic Panel  Recent Labs  08/29/16 1637 08/30/16 0240  NA  --  134*  K  --  3.8  CL  --  102  CO2  --  26  GLUCOSE  --  109*  BUN  --  10  CREATININE 0.70 0.67  CALCIUM  --  9.1   Liver Function Tests No results for input(s): AST, ALT, ALKPHOS, BILITOT, PROT, ALBUMIN in the last 72 hours. No results for input(s): LIPASE, AMYLASE in the last 72 hours. Cardiac Enzymes No results for input(s): CKTOTAL, CKMB, CKMBINDEX, TROPONINI in the last 72 hours. BNP Invalid input(s): POCBNP D-Dimer No results for input(s): DDIMER in the last 72 hours. Hemoglobin A1C No results for input(s): HGBA1C in the last 72 hours. Fasting Lipid Panel No results for input(s): CHOL, HDL, LDLCALC, TRIG, CHOLHDL, LDLDIRECT in the last 72 hours. Thyroid Function Tests No results for input(s): TSH, T4TOTAL, T3FREE, THYROIDAB in the last 72 hours.  Invalid input(s): FREET3  No results found.  Disposition   Pt is being discharged home today in good condition.  Follow-up Plans & Appointments    Follow-up Information    [name], MD. Go on 09/15/2016.   Specialty:  Cardiology Why:  @1 :20 am for post hospital Contact information: [address] [place] [address] [phone]          Discharge Instructions    Amb Referral to Cardiac Rehabilitation    Complete by:  As directed    Diagnosis:  Coronary Stents   Diet - low sodium heart healthy    Complete by:  As directed    Discharge instructions    Complete by:  As directed    No driving for 48. No lifting over 5 lbs for 1 week. No sexual activity for 1 week.Keep procedure site clean & dry. If you notice increased pain, swelling, bleeding or pus, call/return!  You may shower, but no soaking baths/hot tubs/pools for 1 week.   Increase activity slowly    Complete by:  As directed       Discharge Medications   Current Discharge Medication List    START taking these  medications   Details  clopidogrel (PLAVIX) 75 MG tablet Take 1 tablet (75 mg total) by mouth daily with breakfast. Qty: 30 tablet, Refills: 11      CONTINUE these medications which have CHANGED   Details  lisinopril (PRINIVIL,ZESTRIL) 5 MG tablet Take 0.5 tablets (2.5 mg total) by mouth daily. Qty: 90 tablet, Refills: 1      CONTINUE these medications which have NOT CHANGED   Details  aspirin 81 MG chewable tablet Chew 1 tablet (81 mg total) by mouth daily. Qty: 30 tablet, Refills: 0    atorvastatin (LIPITOR) 40 MG tablet Take 1 tablet (40 mg total) by mouth daily. Qty: 30 tablet, Refills: 0    carvedilol (COREG) 3.125 MG tablet Take 1 tablet (3.125 mg total) by mouth 2 (two) times daily with a meal. Qty: 60 tablet, Refills: 0    levothyroxine (SYNTHROID, LEVOTHROID) 25 MCG tablet Take 1 tablet (25 mcg total) by mouth daily before breakfast. Qty: 30 tablet, Refills: 0         Outstanding Labs/Studies   Echo in 3 months  Duration of Discharge Encounter   Greater than 30 minutes including physician time.  Signed, Yuritzi Kamp PA-C 08/31/2016, 12:31 PM

## 2016-08-31 NOTE — Progress Notes (Signed)
RN went over discharge paper work with patient and her boyfriend. Patient verbalized understanding. Prescriptions sent to pharmacy for Plavix and lisinopril. RN spoke with patient about cath site post procedure precautions. IV removed and telemetry d/c'd. RN will take patient downstairs for discharge

## 2016-08-31 NOTE — Progress Notes (Signed)
CARDIAC REHAB PHASE I   PRE:  Rate/Rhythm: 80 SR  BP:  Supine:   Sitting: 127/90  Standing:    SaO2: 98%RA  MODE:  Ambulation: 470 ft   POST:  Rate/Rhythm: 108 ST  BP:  Supine:   Sitting: 131/69  Standing:    SaO2: 99%RA 0955-1015 Pt walked 470 ft on RA with hand held asst. No CP or dizziness. Pt quit smoking cold [place] 2 months ago. Congratulated pt on this. She stated ways she handles craving. No questions re ed done yesterday. Tolerated walk well. Gave [facility] support group card.   Graylon Good, RN BSN  08/31/2016 10:11 AM

## 2016-08-31 NOTE — Progress Notes (Signed)
Patient Name: Pam [name] Date of Encounter: 08/31/2016  Principal Problem:   Chest pain Active Problems:   Chronic systolic heart failure ([place])   Cardiomyopathy, ischemic   Unstable angina ([place])   Length of Stay: 0  SUBJECTIVE  The patient feels much better. No nausea, no chest pain.  CURRENT MEDS . aspirin  81 mg Oral Daily  . atorvastatin  40 mg Oral Daily  . carvedilol  3.125 mg Oral BID WC  . clopidogrel  75 mg Oral Q breakfast  . enoxaparin (LOVENOX) injection  40 mg Subcutaneous Q24H  . levothyroxine  37.5 mcg Oral QAC breakfast  . lisinopril  2.5 mg Oral Daily  . sodium chloride flush  3 mL Intravenous Q12H   OBJECTIVE  Vitals:   08/31/16 0332 08/31/16 0400 08/31/16 0600 08/31/16 0859  BP: (!) 117/53 (!) 120/54 (!) 106/58 103/63  Pulse:    72  Resp: 19 12 15 15   Temp: 97.5 F (36.4 C)   98.1 F (36.7 C)  TempSrc: Oral   Oral  SpO2: 97%   96%  Weight:      Height:        Intake/Output Summary (Last 24 hours) at 08/31/16 0911 Last data filed at 08/30/16 1945  Gross per 24 hour  Intake              600 ml  Output                0 ml  Net              600 ml   Filed Weights   08/29/16 0936 08/29/16 1645  Weight: 125 lb (56.7 kg) 125 lb 1.6 oz (56.7 kg)   PHYSICAL EXAM  General: Pleasant, NAD. Neuro: Alert and oriented X 3. Moves all extremities spontaneously. Psych: Normal affect. HEENT:  Normal  Neck: Supple without bruits or JVD. Lungs:  Resp regular and unlabored, CTA. Heart: RRR no s3, s4, or murmurs. Abdomen: Soft, non-tender, non-distended, BS + x 4.  Extremities: No clubbing, cyanosis or edema. DP/PT/Radials 1+ and equal bilaterally. Mild bruise in the right inguinal insertion site, no bruit.   Accessory Clinical Findings  CBC  Recent Labs  08/29/16 1637 08/30/16 0240  WBC 6.6 7.3  HGB 12.6 11.5*  HCT 36.3 33.8*  MCV 86.8 87.8  PLT 256 [id number]   Basic Metabolic Panel  Recent Labs  08/29/16 1637 08/30/16 0240  NA  --  134*    K  --  3.8  CL  --  102  CO2  --  26  GLUCOSE  --  109*  BUN  --  10  CREATININE 0.70 0.67  CALCIUM  --  9.1   TELE: SR  LHC: 08/29/16 FINDINGS: 1.  Chronic total occlusion of mid LAD. 2.  Heavily calcified 80% stenosis of proximal LCx/OM1. 3.  Normal left ventricular filling pressure. 4.  Small right radial artery with significant spasm, precluding advancement of guide catheter. 5.  Successful orbital atherectomy and IVUS-guided PCI of proximal LCx/OM1 with placement of Synergy 3.0 x 32 mm drug-eluting stent with 0% residual stenosis and TIMI-3 flow.  RECOMMENDATIONS: 1.  Admit for overnight post-cath observation. 2.  Continue dual antiplatelet therapy with aspirin and clopidogrel for at least 12 months. 3.  Aggressive secondary prevention and medical therapy of chronic systolic heart failure. 4.  Remove right femoral artery sheath when ACT < 175 seconds.  TTE: 07/2016 - Left ventricle: The cavity size was normal.  Wall thickness was   normal. Systolic function was moderately reduced. The estimated   ejection fraction was in the range of 35% to 40%. There is   akinesis of the basal and mid anteroseptal, apical anterior and   septal walls and hypokinesis of the anterior and anterolateral   walls. Doppler parameters are consistent with abnormal left   ventricular relaxation (grade 1 diastolic dysfunction). Doppler   parameters are consistent with elevated ventricular end-diastolic   filling pressure. - Aortic valve: Trileaflet; normal thickness leaflets. - Aortic root: The aortic root was normal in size. - Ascending aorta: The ascending aorta was normal in size. - Mitral valve: There was trivial regurgitation. - Left atrium: The atrium was normal in size. - Right ventricle: Systolic function was normal. - Right atrium: The atrium was normal in size. - Atrial septum: No defect or patent foramen ovale was identified. - Tricuspid valve: There was mild regurgitation. -  Pulmonary arteries: Systolic pressure was within the normal   range. - Pericardium, extracardiac: There was no pericardial effusion.  Impressions:  - LVEF is moderately reduced, the findings are consistent with CAD   in the LAD and possibly LM territory.   ASSESSMENT AND PLAN  [name] is a 66 y.o. year-old female with history of coronary artery disease, ischemic cardiomyopathy, hyperlipidemia, and prior tobacco use, who presents for follow-up after NSTEMI last month. Cardiac MRI revealed anterior and apical scar without viable myocardium and an LVEF of 38%.   1. CAD,  Chronic total occlusion of mid LAD. Heavily calcified 80% stenosis of proximal LCx/OM1. Normal left ventricular filling pressure. Small right radial artery with significant spasm, precluding advancement of guide catheter. Successful orbital atherectomy and IVUS-guided PCI of proximal LCx/OM1 with placement of Synergy 3.0 x 32 mm drug-eluting stent with 0% residual stenosis and TIMI-3 flow. - continue low dose carvedilol, lisinopril 2.5 mg po daily, atorvastatin, aspirin and plavix. - discharge home today, follow up in our clinic within 7-10 days.  2. Ischemic cardiomyopathy - as above, LVEF 35-40% with viable myocardium in the RCA and LCX territory, expected improvement of LVEF after stenting, recheck TTE in 6-8 weeks.  3. HLP - onatorvastatin  Signed, Ena Dawley MD, [facility] 08/31/2016

## 2016-09-07 ENCOUNTER — Telehealth: Payer: Self-pay | Admitting: Internal Medicine

## 2016-09-07 MED ORDER — CARVEDILOL 3.125 MG PO TABS: 3.1250 mg | ORAL_TABLET | Freq: Two times a day (BID) | ORAL | 9 refills | Status: DC

## 2016-09-07 NOTE — Telephone Encounter (Signed)
Pt's Rx was sent to pt's pharmacy as requested. Confirmation received.  °

## 2016-09-07 NOTE — Telephone Encounter (Signed)
New Message   *STAT* If patient is at the pharmacy, call can be transferred to refill team.   1. Which medications need to be refilled? (please list name of each medication and dose if known)  carvedilol (Coreg) 3.125 mg tablet twice daily  2. Which pharmacy/location (including street and city if local pharmacy) is medication to be sent to? [id number], [address] [place], [place]  3. Do they need a 30 day or 90 day supply? 30 day supply but would like an auto refill.

## 2016-09-15 ENCOUNTER — Ambulatory Visit (INDEPENDENT_AMBULATORY_CARE_PROVIDER_SITE_OTHER): Payer: Federal, State, Local not specified - PPO | Admitting: Internal Medicine

## 2016-09-15 ENCOUNTER — Encounter: Payer: Self-pay | Admitting: Internal Medicine

## 2016-09-15 VITALS — BP 122/68 | HR 72 | Ht 63.0 in | Wt 126.4 lb

## 2016-09-15 DIAGNOSIS — I5022 Chronic systolic (congestive) heart failure: Secondary | ICD-10-CM

## 2016-09-15 DIAGNOSIS — R1031 Right lower quadrant pain: Secondary | ICD-10-CM

## 2016-09-15 DIAGNOSIS — I2510 Atherosclerotic heart disease of native coronary artery without angina pectoris: Secondary | ICD-10-CM | POA: Diagnosis not present

## 2016-09-15 DIAGNOSIS — R0989 Other specified symptoms and signs involving the circulatory and respiratory systems: Secondary | ICD-10-CM

## 2016-09-15 DIAGNOSIS — I255 Ischemic cardiomyopathy: Secondary | ICD-10-CM | POA: Diagnosis not present

## 2016-09-15 MED ORDER — LISINOPRIL 5 MG PO TABS: 5.0000 mg | ORAL_TABLET | Freq: Every day | ORAL | 1 refills | Status: DC

## 2016-09-15 NOTE — Progress Notes (Signed)
Follow-up Outpatient Visit Date: 09/15/2016  Primary Care Provider: Libby Maw, MD [address] [place] [place] [address]  Chief Complaint: Chest pain, shortness of breath, and dizziness  HPI:  Pam [name] is a 66 y.o. year-old female with history of coronary artery disease, ischemic cardiomyopathy, hyperlipidemia, and prior tobacco use, who presents for follow-up after NSTEMI last month. The patient presented after a mechanical fall during which she fell forward while taking out her trash, striking her face and for head on her driveway. She had difficulty getting up and was ultimately brought to the emergency department by EMS. EKG demonstrated subtle anterolateral ST elevations, though the patient was chest pain-free. Cardiac catheterization was initially deferred but subsequently performed due to mild troponin elevation, which peaked at 0.27. Catheterization revealed chronic total occlusion of the mid LAD as well as high-grade disease of the proximal LCx. Cardiac MRI revealed anterior and apical scar without viable myocardium and an LVEF of 38%. Due to her lack of cardiac symptoms and recent fall with multiple facial abrasions and lacerations, the decision was made to defer intervention to the LCx.  I saw her for follow-up last month (08/25/16), at which time she noted occasional chest pain as well as fatigue and exertional dyspnea. She underwent PCI to the LCx with orbital atherectomy on 08/29/16. She tolerated the procedure well but spent an extra day in the hospital due to fatigue and borderline low blood pressure. Lisinopril was decreased to 2.5 mg daily prior to discharge. Today, Pam [name] reports feeling better compared with pre-PCI. She has more energy and less exertional dyspnea. Her orthostatic lightheadedness has also improved. She continues to have occasional sharp chest pains with exertion that last a few seconds at a time. She notes some pain and bruising in the right groin but is  able to ambulate without difficulty. She is tolerating dual antiplatelet therapy with aspirin and clopidogrel well. She would like to increase lisinopril back to 5 mg daily, if possible.  --------------------------------------------------------------------------------------------------  Cardiovascular History & Procedures: Cardiovascular Problems:  Coronary artery disease with NSTEMI and chronic total occlusion of the proximal/mid LAD  Ischemic cardiomyopathy  Risk Factors:  Known coronary artery disease, hyperlipidemia, tobacco use, age  Cath/PCI:  PCI (08/29/16): Significant left coronary artery disease, including CTO of the mid LAD and 80% calcified proximal LCx stenosis extending into large OM1 branch. Successful IVUS-guided PCI utilizing orbital atherectomy with placement of a Synergy 3.0 x 32 mm drug-eluting stent. Normal left ventricular filling pressure. Significant right radial artery spasm precluded guide catheter advancement. Therefore, femoral access was used for the intervention.  LHC (07/08/16): Significant two-vessel coronary artery disease including totally calcified chronic total occlusion of the proximal/mid LAD and calcified 80% stenosis of proximal LCx/OM one. LVEF 30% with anterior, apical, and apical inferior disc low normal LVEDP (4 mmHg).  CV Surgery:  None  EP Procedures and Devices:  None  Non-Invasive Evaluation(s):  Cardiac MRI (07/11/16): Normal LV size and wall thickness with moderately reduced contraction (EF 38%). Akinesis/dyskinesis of the mid and apical anterior/anteroseptal and apical segments. LAD territory is nonviable. RCA and LCx territories are viable. Normal RV size and function. Normal biatrial size.  Transthoracic echocardiogram (07/08/16): Normal LV size and wall thickness with LVEF of 35-40%. Anterior and apical wall motion abnormality present. Grade 1 diastolic dysfunction. Normal RV size and function. No significant valvular  abnormalities.  Recent CV Pertinent Labs: Lab Results  Component Value Date   CHOL 174 07/07/2016   HDL 87 07/07/2016  LDLCALC 70 07/07/2016   TRIG 87 07/07/2016   CHOLHDL 2.0 07/07/2016   INR 1.1 08/25/2016   K 3.8 08/30/2016   BUN 10 08/30/2016   BUN 12 08/25/2016   CREATININE 0.67 08/30/2016    Past medical and surgical history were reviewed and updated in EPIC.  Outpatient Encounter Prescriptions as of 09/15/2016  Medication Sig  . aspirin 81 MG chewable tablet Chew 1 tablet (81 mg total) by mouth daily.  Marland Kitchen atorvastatin (LIPITOR) 40 MG tablet Take 1 tablet (40 mg total) by mouth daily.  . carvedilol (COREG) 3.125 MG tablet Take 1 tablet (3.125 mg total) by mouth 2 (two) times daily with a meal.  . clopidogrel (PLAVIX) 75 MG tablet Take 1 tablet (75 mg total) by mouth daily with breakfast.  . levothyroxine (SYNTHROID, LEVOTHROID) 25 MCG tablet Take 1 tablet (25 mcg total) by mouth daily before breakfast. (Patient taking differently: Take 37.5 mcg by mouth daily before breakfast. )  . lisinopril (PRINIVIL,ZESTRIL) 5 MG tablet Take 0.5 tablets (2.5 mg total) by mouth daily.   No facility-administered encounter medications on file as of 09/15/2016.     Allergies: Patient has no known allergies.  Social History   Social History  . Marital status: Married    Spouse name: N/A  . Number of children: 2  . Years of education: N/A   Occupational History  . Retired-worked for [employer]    Social History Main Topics  . Smoking status: Former Smoker    Packs/day: 1.00    Years: 50.00    Types: Cigarettes    Quit date: 07/07/2016  . Smokeless tobacco: Never Used  . Alcohol use No  . Drug use: No  . Sexual activity: Not on file   Other Topics Concern  . Not on file   Social History Narrative  . No narrative on file    Family History  Problem Relation Age of Onset  . Kidney failure Mother     Review of Systems: A 12-system review of systems was performed and was  negative except as noted in the HPI.  --------------------------------------------------------------------------------------------------  Physical Exam: BP 122/68 (BP Location: Right Arm, Patient Position: Sitting, Cuff Size: Normal)   Pulse 72   Ht 5\' 3"  (1.6 m)   Wt 126 lb 6.4 oz (57.3 kg)   SpO2 98%   BMI 22.39 kg/m   General:  Well-developed, well-nourished woman seated comfortably in the exam room. HEENT: No conjunctival pallor or scleral icterus.  Moist mucous membranes.  OP clear. Bruising under the right eye is present, Similar to time of catheterization earlier this month. Neck: Supple without lymphadenopathy, thyromegaly, JVD, or HJR.  No carotid bruit. Lungs: Normal work of breathing.  Clear to auscultation bilaterally without wheezes or crackles. Heart: Regular rate and rhythm without murmurs, rubs, or gallops.  Non-displaced PMI. Abd: Bowel sounds present.  Soft, NT/ND without hepatosplenomegaly Ext: No lower extremity edema.  Radial, PT, and DP pulses are 2+ bilaterally. Bruising noted at the right inguinal crease and along the medial thigh. There is mild tenderness without palpable mass. Right femoral pulse is 2+. There is a faint bruit. Skin: warm and dry.  Lab Results  Component Value Date   WBC 7.3 08/30/2016   HGB 11.5 (L) 08/30/2016   HCT 33.8 (L) 08/30/2016   MCV 87.8 08/30/2016   PLT 219 08/30/2016    Lab Results  Component Value Date   NA 134 (L) 08/30/2016   K 3.8 08/30/2016   CL  102 08/30/2016   CO2 26 08/30/2016   BUN 10 08/30/2016   CREATININE 0.67 08/30/2016   GLUCOSE 109 (H) 08/30/2016   ALT 27 07/07/2016    Lab Results  Component Value Date   CHOL 174 07/07/2016   HDL 87 07/07/2016   LDLCALC 70 07/07/2016   TRIG 87 07/07/2016   CHOLHDL 2.0 07/07/2016    --------------------------------------------------------------------------------------------------  ASSESSMENT AND PLAN: Coronary artery disease Patient is doing well following PCI  to the proximal LCx earlier this month. She continues to have some atypical chest pain, albeit improved since our intervention. We will continue with her current medication regimen, including dual antiplatelet therapy with aspirin and clopidogrel, atorvastatin, and carvedilol. We will refer her to cardiac rehabilitation as well.  Ischemic cardiomyopathy with chronic systolic heart failure Ms. Bodley appears euvolemic and well-compensated with NYHA class II symptoms. During her recent hospitalization, lisinopril was decreased to 2.5 mg daily due to postprocedural hypotension. Her blood pressure is normal today and she is eager to increase lisinopril again to optimize her heart failure regimen. We will therefore return to 5 mg daily.  Right groin pain and bruit Patient had a rebleed after sheath removal following PCI earlier this month. She has considerable bruising without hematoma. Femoral and pedal pulses are 2+. Soft bruit is noted, which may be due to mild atherosclerotic disease in the common femoral arteries observed during the catheterization. However, given rebleed, pseudoaneurysm is a consideration. We will obtain a duplex study to exclude this.  Follow-up: Return to clinic in 2 months.  [name], MD 09/15/2016 1:40 PM

## 2016-09-15 NOTE — Patient Instructions (Signed)
Medication Instructions:  Increase lisinopril to 5 mg daily  Labwork: None  Testing/Procedures: Your physician has requested that you have a lower right leg  arterial duplex. This test is an ultrasound of the arteries in the right leg.   Follow-Up: Your physician recommends that you schedule a follow-up appointment in: 2 months with Dr End.   Any Other Special Instructions Will Be Listed Below (If Applicable). I will ask Cardiac Rehab at [facility] to get in touch with you about starting Cardiac Rehab. If you do not hear from them by the middle of next week give them a call, the phone number is [phone].    If you need a refill on your cardiac medications before your next appointment, please call your pharmacy.

## 2016-09-16 ENCOUNTER — Ambulatory Visit (HOSPITAL_COMMUNITY)
Admission: RE | Admit: 2016-09-16 | Discharge: 2016-09-16 | Disposition: A | Discharge status: Home / Self Care | Payer: Federal, State, Local not specified - PPO | Source: Ambulatory Visit | Attending: Cardiology | Admitting: Cardiology

## 2016-09-16 ENCOUNTER — Telehealth: Payer: Self-pay | Admitting: Internal Medicine

## 2016-09-16 DIAGNOSIS — R0989 Other specified symptoms and signs involving the circulatory and respiratory systems: Secondary | ICD-10-CM | POA: Diagnosis not present

## 2016-09-16 DIAGNOSIS — E785 Hyperlipidemia, unspecified: Secondary | ICD-10-CM | POA: Diagnosis not present

## 2016-09-16 DIAGNOSIS — R1031 Right lower quadrant pain: Secondary | ICD-10-CM | POA: Insufficient documentation

## 2016-09-16 DIAGNOSIS — I255 Ischemic cardiomyopathy: Secondary | ICD-10-CM | POA: Diagnosis not present

## 2016-09-16 DIAGNOSIS — I2510 Atherosclerotic heart disease of native coronary artery without angina pectoris: Secondary | ICD-10-CM | POA: Diagnosis not present

## 2016-09-16 DIAGNOSIS — I739 Peripheral vascular disease, unspecified: Secondary | ICD-10-CM | POA: Diagnosis not present

## 2016-09-16 NOTE — Telephone Encounter (Signed)
I spoke with the patient regarding the results of her right groin ultrasound. This did not show any evidence of pseudoaneurysm, AV fistula, or other vascular trauma. I advised her that it will take up to a few more weeks for the bruising in her groin to completely resolve. She should continue her current medications. I think it is reasonable for her to begin cardiac rehab.  [name], MD [facility] Pager: [phone]

## 2016-09-22 ENCOUNTER — Ambulatory Visit: Payer: Medicare Other | Admitting: Internal Medicine

## 2016-09-27 ENCOUNTER — Encounter (HOSPITAL_COMMUNITY)
Admission: RE | Admit: 2016-09-27 | Discharge: 2016-09-27 | Disposition: A | Discharge status: Home / Self Care | Payer: Federal, State, Local not specified - PPO | Source: Ambulatory Visit | Attending: Internal Medicine | Admitting: Internal Medicine

## 2016-09-27 ENCOUNTER — Telehealth (HOSPITAL_COMMUNITY): Payer: Self-pay | Admitting: Family Medicine

## 2016-09-27 ENCOUNTER — Encounter (HOSPITAL_COMMUNITY): Payer: Self-pay

## 2016-09-27 VITALS — BP 132/63 | HR 68 | Ht 63.0 in | Wt 125.7 lb

## 2016-09-27 DIAGNOSIS — Z955 Presence of coronary angioplasty implant and graft: Secondary | ICD-10-CM | POA: Insufficient documentation

## 2016-09-27 DIAGNOSIS — Z79899 Other long term (current) drug therapy: Secondary | ICD-10-CM | POA: Diagnosis not present

## 2016-09-27 DIAGNOSIS — Z87891 Personal history of nicotine dependence: Secondary | ICD-10-CM | POA: Insufficient documentation

## 2016-09-27 DIAGNOSIS — Z7902 Long term (current) use of antithrombotics/antiplatelets: Secondary | ICD-10-CM | POA: Diagnosis not present

## 2016-09-27 DIAGNOSIS — Z7982 Long term (current) use of aspirin: Secondary | ICD-10-CM | POA: Diagnosis not present

## 2016-09-27 NOTE — Progress Notes (Signed)
Cardiac Rehab Medication Review by a Pharmacist  Does the patient  feel that his/her medications are working for him/her?  yes  Has the patient been experiencing any side effects to the medications prescribed?  no  Does the patient measure his/her own blood pressure or blood glucose at home?  no   Does the patient have any problems obtaining medications due to transportation or finances?   no  Understanding of regimen: good Understanding of indications: good Potential of compliance: excellent    Pharmacist comments: Pam [name] presents today for medication review. She reports excellent compliance. She does report occasional dizziness that resulted in a fall previously. Counseled on orthostatic hypotension and recommended monitoring BP. She also reports occasional constipation- recommended docusate. Patient verbalized understanding.   Pam [name], PharmD PGY1 Pharmacy Resident Pager: [phone] 09/27/2016 8:20 AM

## 2016-09-27 NOTE — Progress Notes (Signed)
Cardiac Individual Treatment Plan  Patient Details  Name: Pam [name] MRN: [id number] Date of Birth: Dec 27, 1950 Referring Provider:   Flowsheet Row CARDIAC REHAB PHASE II ORIENTATION from 09/27/2016 in [place]  Referring Provider  End, Harrell Gave MD      Initial Encounter Date:  [place] PHASE II ORIENTATION from 09/27/2016 in [place]  Date  09/27/16  Referring Provider  End, Harrell Gave MD      Visit Diagnosis: 08/29/16 Status post coronary artery stent placement  Patient's Home Medications on Admission:  Current Outpatient Prescriptions:  .  acetaminophen (TYLENOL) 650 MG CR tablet, Take 650 mg by mouth every 8 (eight) hours as needed for pain (headache)., Disp: , Rfl:  .  aspirin 81 MG chewable tablet, Chew 1 tablet (81 mg total) by mouth daily., Disp: 30 tablet, Rfl: 0 .  atorvastatin (LIPITOR) 40 MG tablet, Take 1 tablet (40 mg total) by mouth daily., Disp: 30 tablet, Rfl: 0 .  carvedilol (COREG) 3.125 MG tablet, Take 1 tablet (3.125 mg total) by mouth 2 (two) times daily with a meal., Disp: 60 tablet, Rfl: 9 .  clopidogrel (PLAVIX) 75 MG tablet, Take 1 tablet (75 mg total) by mouth daily with breakfast., Disp: 30 tablet, Rfl: 11 .  levothyroxine (SYNTHROID, LEVOTHROID) 25 MCG tablet, Take 37.5 mcg by mouth daily before breakfast., Disp: , Rfl:  .  lisinopril (PRINIVIL,ZESTRIL) 5 MG tablet, Take 1 tablet (5 mg total) by mouth daily., Disp: 90 tablet, Rfl: 1  Past Medical History: Past Medical History:  Diagnosis Date  . Hyperlipidemia   . Thyroid disease     Tobacco Use: History  Smoking Status  . Former Smoker  . Packs/day: 1.00  . Years: 50.00  . Types: Cigarettes  . Quit date: 07/07/2016  Smokeless Tobacco  . Never Used    Labs: Recent Review Flowsheet Data    Labs for ITP Cardiac and Pulmonary Rehab Latest Ref Rng & Units 07/07/2016 07/09/2016   Cholestrol 0 - 200 mg/dL  174 -   LDLCALC 0 - 99 mg/dL 70 -   HDL >40 mg/dL 87 -   Trlycerides <150 mg/dL 87 -   Hemoglobin A1c 4.8 - 5.6 % - 5.3      Capillary Blood Glucose: No results found for: GLUCAP   Exercise Target Goals: Date: 09/27/16  Exercise Program Goal: Individual exercise prescription set with THRR, safety & activity barriers. Participant demonstrates ability to understand and report RPE using BORG scale, to self-measure pulse accurately, and to acknowledge the importance of the exercise prescription.  Exercise Prescription Goal: Starting with aerobic activity 30 plus minutes a day, 3 days per week for initial exercise prescription. Provide home exercise prescription and guidelines that participant acknowledges understanding prior to discharge.  Activity Barriers & Risk Stratification:     Activity Barriers & Cardiac Risk Stratification - 09/27/16 1112      Activity Barriers & Cardiac Risk Stratification   Activity Barriers None   Cardiac Risk Stratification Moderate      6 Minute Walk:     6 Minute Walk    Row Name 09/27/16 1029         6 Minute Walk   Phase Initial     Distance 1178 feet     Walk Time 6 minutes     # of Rest Breaks 0     MPH 2.23     METS 3.01     RPE 13  VO2 Peak 10.53     Symptoms Yes (comment)     Comments Patient c/o dyspnea.     Resting HR 68 bpm     Resting BP 132/63     Max Ex. HR 98 bpm     Max Ex. BP 114/60     2 Minute Post BP 120/70        Oxygen Initial Assessment:   Oxygen Re-Evaluation:   Oxygen Discharge (Final Oxygen Re-Evaluation):   Initial Exercise Prescription:     Initial Exercise Prescription - 09/27/16 1000      Date of Initial Exercise RX and Referring Provider   Date 09/27/16   Referring Provider End, Harrell Gave MD     Bike   Level 0.5   Minutes 10   METs 2.72     Recumbant Bike   Level 2   RPM 20   Watts 10   Minutes 10   METs 1.4     NuStep   Level 2   SPM 70   Minutes 10   METs 2      Track   Laps 7   Minutes 10   METs 2.23     Prescription Details   Frequency (times per week) 3   Duration Progress to 30 minutes of continuous aerobic without signs/symptoms of physical distress     Intensity   THRR 40-80% of Max Heartrate 62-124   Ratings of Perceived Exertion 11-13   Perceived Dyspnea 0-4     Progression   Progression Continue to progress workloads to maintain intensity without signs/symptoms of physical distress.     Resistance Training   Training Prescription Yes   Weight 2lbs   Reps 10-15      Perform Capillary Blood Glucose checks as needed.  Exercise Prescription Changes:   Exercise Comments:   Exercise Goals and Review:     Exercise Goals    Row Name 09/27/16 0849             Exercise Goals   Increase Physical Activity Yes       Intervention Provide advice, education, support and counseling about physical activity/exercise needs.;Develop an individualized exercise prescription for aerobic and resistive training based on initial evaluation findings, risk stratification, comorbidities and participant's personal goals.       Expected Outcomes Achievement of increased cardiorespiratory fitness and enhanced flexibility, muscular endurance and strength shown through measurements of functional capacity and personal statement of participant.       Increase Strength and Stamina Yes       Intervention Provide advice, education, support and counseling about physical activity/exercise needs.;Develop an individualized exercise prescription for aerobic and resistive training based on initial evaluation findings, risk stratification, comorbidities and participant's personal goals.       Expected Outcomes Achievement of increased cardiorespiratory fitness and enhanced flexibility, muscular endurance and strength shown through measurements of functional capacity and personal statement of participant.          Exercise Goals Re-Evaluation  :    Discharge Exercise Prescription (Final Exercise Prescription Changes):   Nutrition:  Target Goals: Understanding of nutrition guidelines, daily intake of sodium 1500mg , cholesterol 200mg , calories 30% from fat and 7% or less from saturated fats, daily to have 5 or more servings of fruits and vegetables.  Biometrics:     Pre Biometrics - 09/27/16 0841      Pre Biometrics   Waist Circumference 28.5 inches   Hip Circumference 38 inches   Waist to Hip Ratio  0.75 %   Triceps Skinfold 24 mm   % Body Fat 33 %   Grip Strength 22 kg   Flexibility 13.25 in   Single Leg Stand 30 seconds       Nutrition Therapy Plan and Nutrition Goals:   Nutrition Discharge: Nutrition Scores:   Nutrition Goals Re-Evaluation:   Nutrition Goals Re-Evaluation:   Nutrition Goals Discharge (Final Nutrition Goals Re-Evaluation):   Psychosocial: Target Goals: Acknowledge presence or absence of significant depression and/or stress, maximize coping skills, provide positive support system. Participant is able to verbalize types and ability to use techniques and skills needed for reducing stress and depression.  Initial Review & Psychosocial Screening:     Initial Psych Review & Screening - 09/27/16 1145      Initial Review   Current issues with None Identified     Family Dynamics   Good Support System? Yes   Comments Brief Psychosocial Assessment, no identifiable needs at this time.   Husband accompanied pt to orientation appt.     Barriers   Psychosocial barriers to participate in program There are no identifiable barriers or psychosocial needs.      Quality of Life Scores:     Quality of Life - 09/27/16 1007      Quality of Life Scores   Health/Function Pre 23.1 %   Socioeconomic Pre 26 %   Psych/Spiritual Pre 24 %   Family Pre 26.4 %   GLOBAL Pre 24.32 %      PHQ-9: Recent Review Flowsheet Data    There is no flowsheet data to display.     Interpretation of Total  Score  Total Score Depression Severity:  1-4 = Minimal depression, 5-9 = Mild depression, 10-14 = Moderate depression, 15-19 = Moderately severe depression, 20-27 = Severe depression   Psychosocial Evaluation and Intervention:   Psychosocial Re-Evaluation:   Psychosocial Discharge (Final Psychosocial Re-Evaluation):   Vocational Rehabilitation: Provide vocational rehab assistance to qualifying candidates.   Vocational Rehab Evaluation & Intervention:   Education: Education Goals: Education classes will be provided on a weekly basis, covering required topics. Participant will state understanding/return demonstration of topics presented.  Learning Barriers/Preferences:     Learning Barriers/Preferences - 09/27/16 0848      Learning Barriers/Preferences   Learning Barriers Sight   Learning Preferences Written Material;Video;Skilled Demonstration      Education Topics: Count Your Pulse:  -Group instruction provided by verbal instruction, demonstration, patient participation and written materials to support subject.  Instructors address importance of being able to find your pulse and how to count your pulse when at home without a heart monitor.  Patients get hands on experience counting their pulse with staff help and individually.   Heart Attack, Angina, and Risk Factor Modification:  -Group instruction provided by verbal instruction, video, and written materials to support subject.  Instructors address signs and symptoms of angina and heart attacks.    Also discuss risk factors for heart disease and how to make changes to improve heart health risk factors.   Functional Fitness:  -Group instruction provided by verbal instruction, demonstration, patient participation, and written materials to support subject.  Instructors address safety measures for doing things around the house.  Discuss how to get up and down off the floor, how to pick things up properly, how to safely get out  of a chair without assistance, and balance training.   Meditation and Mindfulness:  -Group instruction provided by verbal instruction, patient participation, and written materials to support  subject.  Instructor addresses importance of mindfulness and meditation practice to help reduce stress and improve awareness.  Instructor also leads participants through a meditation exercise.    Stretching for Flexibility and Mobility:  -Group instruction provided by verbal instruction, patient participation, and written materials to support subject.  Instructors lead participants through series of stretches that are designed to increase flexibility thus improving mobility.  These stretches are additional exercise for major muscle groups that are typically performed during regular warm up and cool down.   Hands Only CPR Anytime:  -Group instruction provided by verbal instruction, video, patient participation and written materials to support subject.  Instructors co-teach with AHA video for hands only CPR.  Participants get hands on experience with mannequins.   Nutrition I class: Heart Healthy Eating:  -Group instruction provided by PowerPoint slides, verbal discussion, and written materials to support subject matter. The instructor gives an explanation and review of the Therapeutic Lifestyle Changes diet recommendations, which includes a discussion on lipid goals, dietary fat, sodium, fiber, plant stanol/sterol esters, sugar, and the components of a well-balanced, healthy diet.   Nutrition II class: Lifestyle Skills:  -Group instruction provided by PowerPoint slides, verbal discussion, and written materials to support subject matter. The instructor gives an explanation and review of label reading, grocery shopping for heart health, heart healthy recipe modifications, and ways to make healthier choices when eating out.   Diabetes Question & Answer:  -Group instruction provided by PowerPoint slides,  verbal discussion, and written materials to support subject matter. The instructor gives an explanation and review of diabetes co-morbidities, pre- and post-prandial blood glucose goals, pre-exercise blood glucose goals, signs, symptoms, and treatment of hypoglycemia and hyperglycemia, and foot care basics.   Diabetes Blitz:  -Group instruction provided by PowerPoint slides, verbal discussion, and written materials to support subject matter. The instructor gives an explanation and review of the physiology behind type 1 and type 2 diabetes, diabetes medications and rational behind using different medications, pre- and post-prandial blood glucose recommendations and Hemoglobin A1c goals, diabetes diet, and exercise including blood glucose guidelines for exercising safely.    Portion Distortion:  -Group instruction provided by PowerPoint slides, verbal discussion, written materials, and food models to support subject matter. The instructor gives an explanation of serving size versus portion size, changes in portions sizes over the last 20 years, and what consists of a serving from each food group.   Stress Management:  -Group instruction provided by verbal instruction, video, and written materials to support subject matter.  Instructors review role of stress in heart disease and how to cope with stress positively.     Exercising on Your Own:  -Group instruction provided by verbal instruction, power point, and written materials to support subject.  Instructors discuss benefits of exercise, components of exercise, frequency and intensity of exercise, and end points for exercise.  Also discuss use of nitroglycerin and activating EMS.  Review options of places to exercise outside of rehab.  Review guidelines for sex with heart disease.   Cardiac Drugs I:  -Group instruction provided by verbal instruction and written materials to support subject.  Instructor reviews cardiac drug classes: antiplatelets,  anticoagulants, beta blockers, and statins.  Instructor discusses reasons, side effects, and lifestyle considerations for each drug class.   Cardiac Drugs II:  -Group instruction provided by verbal instruction and written materials to support subject.  Instructor reviews cardiac drug classes: angiotensin converting enzyme inhibitors (ACE-I), angiotensin II receptor blockers (ARBs), nitrates, and calcium channel blockers.  Instructor discusses reasons, side effects, and lifestyle considerations for each drug class.   Anatomy and Physiology of the Circulatory System:  -Group instruction provided by verbal instruction, video, and written materials to support subject.  Reviews functional anatomy of heart, how it relates to various diagnoses, and what role the heart plays in the overall system.   Knowledge Questionnaire Score:     Knowledge Questionnaire Score - 09/27/16 1007      Knowledge Questionnaire Score   Pre Score 20/24      Core Components/Risk Factors/Patient Goals at Admission:     Personal Goals and Risk Factors at Admission - 09/27/16 1027      Core Components/Risk Factors/Patient Goals on Admission   Improve shortness of breath with ADL's Yes   Intervention Provide education, individualized exercise plan and daily activity instruction to help decrease symptoms of SOB with activities of daily living.   Expected Outcomes Short Term: Achieves a reduction of symptoms when performing activities of daily living.   Heart Failure Yes   Intervention Provide a combined exercise and nutrition program that is supplemented with education, support and counseling about heart failure. Directed toward relieving symptoms such as shortness of breath, decreased exercise tolerance, and extremity edema.   Expected Outcomes Improve functional capacity of life;Short term: Attendance in program 2-3 days a week with increased exercise capacity. Reported lower sodium intake. Reported increased fruit  and vegetable intake. Reports medication compliance.;Short term: Daily weights obtained and reported for increase. Utilizing diuretic protocols set by physician.;Long term: Adoption of self-care skills and reduction of barriers for early signs and symptoms recognition and intervention leading to self-care maintenance.   Lipids Yes   Intervention Provide education and support for participant on nutrition & aerobic/resistive exercise along with prescribed medications to achieve LDL 70mg , HDL >40mg .   Expected Outcomes Short Term: Participant states understanding of desired cholesterol values and is compliant with medications prescribed. Participant is following exercise prescription and nutrition guidelines.;Long Term: Cholesterol controlled with medications as prescribed, with individualized exercise RX and with personalized nutrition plan. Value goals: LDL < 70mg , HDL > 40 mg.   Stress Yes   Intervention Offer individual and/or small group education and counseling on adjustment to heart disease, stress management and health-related lifestyle change. Teach and support self-help strategies.;Refer participants experiencing significant psychosocial distress to appropriate mental health specialists for further evaluation and treatment. When possible, include family members and significant others in education/counseling sessions.  medical stress   Expected Outcomes Short Term: Participant demonstrates changes in health-related behavior, relaxation and other stress management skills, ability to obtain effective social support, and compliance with psychotropic medications if prescribed.;Long Term: Emotional wellbeing is indicated by absence of clinically significant psychosocial distress or social isolation.      Core Components/Risk Factors/Patient Goals Review:    Core Components/Risk Factors/Patient Goals at Discharge (Final Review):    ITP Comments:     ITP Comments    Row Name 09/27/16 0840            ITP Comments Dr. Fransico Him, Medical Director          Comments:  Pt in today for cardiac rehab phase II orientation from 0800-0930. As a part of the orientation pt completed 5 warm up stretches and 6 minute walk test.  Pt tolerated walk test well with no complaints of cp or sob.  Monitor shows SR with inverted Twave.  T wave inversion noted on most recent 12 lead ekg.  Brief psychosocial assessment reveals no  barriers to participating in cardiac rehab. No immediate identifiable needs at this time.  Husband accompanies pt for orientation appt.  Pt is looking forward to participating in cardiac rehab. Cherre Huger, BSN

## 2016-09-27 NOTE — Telephone Encounter (Addendum)
Verified [employer] insurance benefits through [employer], Co-Pay $30.00, No Deductible, Co-Insurance 30% Out of Pocket $5500.00 pt has met $133.27, pt's responsibility is $5366.73.  Reference # is [id number]... KJ  Pt has Medicare A only [id number] A

## 2016-09-28 ENCOUNTER — Telehealth (HOSPITAL_COMMUNITY): Payer: Self-pay | Admitting: *Deleted

## 2016-09-28 NOTE — Telephone Encounter (Signed)
Received insurance benefits for cardiac rehab. Pt informed 30.00 per exercise session for cardiac rehab.  Pt indicated she was aware of this and can afford to pay this amount.  Questions answered. Cherre Huger, BSN

## 2016-10-03 ENCOUNTER — Telehealth: Payer: Self-pay | Admitting: Internal Medicine

## 2016-10-03 ENCOUNTER — Encounter (HOSPITAL_COMMUNITY)
Admission: RE | Admit: 2016-10-03 | Discharge: 2016-10-03 | Disposition: A | Discharge status: Home / Self Care | Payer: Federal, State, Local not specified - PPO | Source: Ambulatory Visit | Attending: Internal Medicine | Admitting: Internal Medicine

## 2016-10-03 ENCOUNTER — Encounter (HOSPITAL_COMMUNITY): Admission: RE | Admit: 2016-10-03 | Payer: Federal, State, Local not specified - PPO | Source: Ambulatory Visit

## 2016-10-03 DIAGNOSIS — Z955 Presence of coronary angioplasty implant and graft: Secondary | ICD-10-CM | POA: Diagnosis present

## 2016-10-03 DIAGNOSIS — Z87891 Personal history of nicotine dependence: Secondary | ICD-10-CM | POA: Diagnosis not present

## 2016-10-03 DIAGNOSIS — Z7982 Long term (current) use of aspirin: Secondary | ICD-10-CM | POA: Insufficient documentation

## 2016-10-03 DIAGNOSIS — Z79899 Other long term (current) drug therapy: Secondary | ICD-10-CM | POA: Insufficient documentation

## 2016-10-03 DIAGNOSIS — Z7902 Long term (current) use of antithrombotics/antiplatelets: Secondary | ICD-10-CM | POA: Insufficient documentation

## 2016-10-03 NOTE — Telephone Encounter (Signed)
Pam [name] from Cardiac Rehab is calling on behalf of patient. She states that [name] was light-headed and her BP dropped a little. Patient BP standing was 90/60 and sitting 110/60. Thanks.

## 2016-10-03 NOTE — Progress Notes (Signed)
Daily Session Note  Patient Details  Name: Pam [name] MRN: [id number] Date of Birth: [birth year] Referring Provider:   Flowsheet Row CARDIAC REHAB PHASE II ORIENTATION from 09/27/2016 in [facility]  Referring Provider  End, Harrell Gave MD      Encounter Date: 10/03/2016  Check In:     Session Check In - 10/03/16 1002      Check-In   Location [facility]   Staff Present Su Hilt, MS, ACSM RCEP, Exercise Physiologist;Maria Whitaker, RN, Lise Auer, MS, Exercise Physiologist   Supervising physician immediately available to respond to emergencies [facility] immediately available   Physician(s) Dr. Candiss Norse   Medication changes reported     No   Fall or balance concerns reported    No   Tobacco Cessation No Change   Warm-up and Cool-down Performed as group-led instruction   Resistance Training Performed Yes   VAD Patient? No     Pain Assessment   Currently in Pain? No/denies   Multiple Pain Sites No      Capillary Blood Glucose: No results found for this or any previous visit (from the past 24 hour(s)).    History  Smoking Status  . Former Smoker  . Packs/day: 1.00  . Years: 50.00  . Types: Cigarettes  . Quit date: 07/07/2016  Smokeless Tobacco  . Never Used    Goals Met:  Patient reported feeling slightly lightheaded at the end of exercise.  Goals Unmet:  Not Applicable  Comments: [name] started cardiac rehab today.  Pt tolerated light exercise without difficulty. VSS, telemetry-Sinus Rhythm with an inverted t wave, asymptomatic.  Medication list reconciled. Pt denies barriers to medicaiton compliance.  PSYCHOSOCIAL ASSESSMENT:  PHQ-0. Pt exhibits positive coping skills, hopeful outlook with supportive family. No psychosocial needs identified at this time, no psychosocial interventions necessary.    Pt [place] the mountains.   Pt oriented to exercise equipment and routine.    Understanding  verbalized.  [name] reported that she felt lightheaded today after standing up in the chair at the end of class. [name] also reported that she felt dizzy getting off the airdyne. [name] did not report that she felt lightheaded until after class was over. Entry blood pressure 128/60 with a heart rate of 79. Blood pressure 144/70 on the nustep with a heart rate 88. Blood pressure 140/78 on the walking track with a heart rate of 109. Blood pressure was 120/70 on the airdyne with a heart rate of 126.  Exit blood pressure 112/60 with a heart rate of 86. [name] drank about a 1/2 of cup of water today with exercise but had not drank any water today.  Sitting blood pressure 110/60. Standing blood pressure 98/60. [name] denied any further complaints of dizziness after she finished her cup of water.  Dr Darnelle Bos office called and notified about [name]'s complaint of feeling lightheaded.  I spoke with Theodosia Quay RN. Patient instructed to make sure that she is drinking enough water and we will continue to monitor [name]'s blood pressure. Will continue to monitor the patient. No other orders were received at this time.Barnet Pall, RN,BSN 10/03/2016 11:55 AM Dr. Fransico Him is Medical Director for Cardiac Rehab at [facility].

## 2016-10-03 NOTE — Telephone Encounter (Signed)
I spoke with Pam [name] at cardiac rehab and she called to report that the pt complained of being light headed when changing position at rehab today.  Initial BP per Pam [name]'s check was 112/60.  Pam [name] checked BP sitting 110/60 and then standing 98/60.  The pt was given water to drink. The pt does drink water at home through out the day but today she did not have any water to drink before attending rehab. I advised that the pt make sure she drinks water through out the day and check BP at home. Lisinopril was recently increased to 5mg .  Cardiac rehab will also continue to monitor the pt and contact the office if BP remains low or pt has issues with being lightheaded. I will forward this information to Dr End for review.

## 2016-10-03 NOTE — Telephone Encounter (Signed)
I agree with increasing water intake. If lightheadedness remains a problem, Pam [name] can cut lisinopril in half. Thanks.

## 2016-10-03 NOTE — Telephone Encounter (Signed)
I spoke with the pt and made her aware of Dr Darnelle Bos comments.

## 2016-10-05 ENCOUNTER — Encounter (HOSPITAL_COMMUNITY)
Admission: RE | Admit: 2016-10-05 | Discharge: 2016-10-05 | Disposition: A | Discharge status: Home / Self Care | Payer: Federal, State, Local not specified - PPO | Source: Ambulatory Visit | Attending: Internal Medicine | Admitting: Internal Medicine

## 2016-10-05 ENCOUNTER — Encounter (HOSPITAL_COMMUNITY): Payer: Federal, State, Local not specified - PPO

## 2016-10-05 DIAGNOSIS — Z955 Presence of coronary angioplasty implant and graft: Secondary | ICD-10-CM

## 2016-10-07 ENCOUNTER — Encounter (HOSPITAL_COMMUNITY)
Admission: RE | Admit: 2016-10-07 | Discharge: 2016-10-07 | Disposition: A | Discharge status: Home / Self Care | Payer: Federal, State, Local not specified - PPO | Source: Ambulatory Visit | Attending: Internal Medicine | Admitting: Internal Medicine

## 2016-10-07 ENCOUNTER — Encounter (HOSPITAL_COMMUNITY): Payer: Federal, State, Local not specified - PPO

## 2016-10-07 DIAGNOSIS — Z955 Presence of coronary angioplasty implant and graft: Secondary | ICD-10-CM

## 2016-10-10 ENCOUNTER — Encounter (HOSPITAL_COMMUNITY): Payer: Federal, State, Local not specified - PPO

## 2016-10-10 ENCOUNTER — Telehealth (HOSPITAL_COMMUNITY): Payer: Self-pay | Admitting: Family Medicine

## 2016-10-12 ENCOUNTER — Encounter (HOSPITAL_COMMUNITY): Payer: Federal, State, Local not specified - PPO

## 2016-10-12 ENCOUNTER — Encounter (HOSPITAL_COMMUNITY)
Admission: RE | Admit: 2016-10-12 | Discharge: 2016-10-12 | Disposition: A | Discharge status: Home / Self Care | Payer: Federal, State, Local not specified - PPO | Source: Ambulatory Visit | Attending: Internal Medicine | Admitting: Internal Medicine

## 2016-10-12 DIAGNOSIS — Z955 Presence of coronary angioplasty implant and graft: Secondary | ICD-10-CM

## 2016-10-12 NOTE — Progress Notes (Signed)
Reviewed home exercise program with pt.  Discussed mode/frequency of exercise, THRR, RPE scale and weather conditions for exercising outdoors.  Also discussed signs and symptoms, NTG use and when to call Dr./911.  Pt received copy of HEP and verbalized understanding.  Cleda Mccreedy, MS ACSM RCEP 10/12/2016 16:55

## 2016-10-13 ENCOUNTER — Inpatient Hospital Stay (HOSPITAL_COMMUNITY): Admission: RE | Admit: 2016-10-13 | Payer: Federal, State, Local not specified - PPO | Source: Ambulatory Visit

## 2016-10-14 ENCOUNTER — Encounter (HOSPITAL_COMMUNITY)
Admission: RE | Admit: 2016-10-14 | Discharge: 2016-10-14 | Disposition: A | Discharge status: Home / Self Care | Payer: Federal, State, Local not specified - PPO | Source: Ambulatory Visit | Attending: Internal Medicine | Admitting: Internal Medicine

## 2016-10-14 ENCOUNTER — Encounter (HOSPITAL_COMMUNITY): Payer: Federal, State, Local not specified - PPO

## 2016-10-14 DIAGNOSIS — Z955 Presence of coronary angioplasty implant and graft: Secondary | ICD-10-CM | POA: Diagnosis not present

## 2016-10-17 ENCOUNTER — Encounter (HOSPITAL_COMMUNITY)
Admission: RE | Admit: 2016-10-17 | Discharge: 2016-10-17 | Disposition: A | Discharge status: Home / Self Care | Payer: Federal, State, Local not specified - PPO | Source: Ambulatory Visit | Attending: Internal Medicine | Admitting: Internal Medicine

## 2016-10-17 ENCOUNTER — Encounter (HOSPITAL_COMMUNITY): Payer: Federal, State, Local not specified - PPO

## 2016-10-17 DIAGNOSIS — Z955 Presence of coronary angioplasty implant and graft: Secondary | ICD-10-CM

## 2016-10-17 NOTE — Progress Notes (Signed)
Pam [name] 66 y.o. female Nutrition Note Spoke with pt. Nutrition Plan and Nutrition Survey goals reviewed with pt. Pt is following Step 2 of the Therapeutic Lifestyle Changes diet. Pt reports "I used to weigh 200 lbs." Pt has maintained her current wt for "a long time." Pt with dx of CHF. Per discussion, pt does not use canned/convenience foods often. Pt does consume some high sodium foods (e.g. Ham, cheese, biscuits, muffins), but portion sizes consumed reportedly small. Pt eats out infrequently. Pt expressed understanding of the information reviewed. Pt aware of nutrition education classes offered and plans on attending nutrition classes.  Lab Results  Component Value Date   HGBA1C 5.3 07/09/2016   Wt Readings from Last 3 Encounters:  09/27/16 125 lb 10.6 oz (57 kg)  09/15/16 126 lb 6.4 oz (57.3 kg)  08/29/16 125 lb 1.6 oz (56.7 kg)   Nutrition Diagnosis ? Food-and nutrition-related knowledge deficit related to lack of exposure to information as related to diagnosis of: ? CVD  Nutrition Intervention ? Pt's individual nutrition plan reviewed with pt. ? Benefits of adopting Therapeutic Lifestyle Changes discussed when Medficts reviewed. ? Pt to attend the Portion Distortion class ? Pt to attend the   ? Nutrition I class                     ? Nutrition II class  ? Pt given handouts for: ? low sodium ? Continue client-centered nutrition education by RD, as part of interdisciplinary care. Goal(s) ? Pt to identify and limit food sources of sodium ? Pt to describe the benefit of including fruits, vegetables, whole grains, and low-fat dairy products in a heart healthy meal plan. Monitor and Evaluate progress toward nutrition goal with team. Derek Mound, M.Ed, RD, LDN, CDE 10/17/2016 10:43 AM

## 2016-10-19 ENCOUNTER — Encounter (HOSPITAL_COMMUNITY)
Admission: RE | Admit: 2016-10-19 | Discharge: 2016-10-19 | Disposition: A | Discharge status: Home / Self Care | Payer: Federal, State, Local not specified - PPO | Source: Ambulatory Visit | Attending: Internal Medicine | Admitting: Internal Medicine

## 2016-10-19 ENCOUNTER — Telehealth: Payer: Self-pay | Admitting: Cardiology

## 2016-10-19 ENCOUNTER — Other Ambulatory Visit: Payer: Self-pay | Admitting: Cardiology

## 2016-10-19 ENCOUNTER — Encounter (HOSPITAL_COMMUNITY): Payer: Federal, State, Local not specified - PPO

## 2016-10-19 ENCOUNTER — Telehealth: Payer: Self-pay

## 2016-10-19 ENCOUNTER — Ambulatory Visit (HOSPITAL_COMMUNITY)
Admission: RE | Admit: 2016-10-19 | Discharge: 2016-10-19 | Disposition: A | Discharge status: Home / Self Care | Payer: Federal, State, Local not specified - PPO | Source: Ambulatory Visit | Attending: Family Medicine | Admitting: Family Medicine

## 2016-10-19 DIAGNOSIS — Z7982 Long term (current) use of aspirin: Secondary | ICD-10-CM | POA: Diagnosis not present

## 2016-10-19 DIAGNOSIS — Z955 Presence of coronary angioplasty implant and graft: Secondary | ICD-10-CM

## 2016-10-19 DIAGNOSIS — Z79899 Other long term (current) drug therapy: Secondary | ICD-10-CM | POA: Diagnosis not present

## 2016-10-19 DIAGNOSIS — Z7902 Long term (current) use of antithrombotics/antiplatelets: Secondary | ICD-10-CM | POA: Diagnosis not present

## 2016-10-19 MED ORDER — CARVEDILOL 6.25 MG PO TABS: 6.2500 mg | ORAL_TABLET | Freq: Two times a day (BID) | ORAL | Status: DC

## 2016-10-19 MED ORDER — NITROGLYCERIN 0.4 MG SL SUBL
0.4000 mg | SUBLINGUAL_TABLET | SUBLINGUAL | 3 refills | Status: DC | PRN
Start: 2016-10-19 — End: 2017-12-04

## 2016-10-19 NOTE — Telephone Encounter (Signed)
    I was contacted by Cardiac Rehab RN, to evaluate the patient given development of exertional chest discomfort while exercising on the stair climber today.   In brief, the patient is a 66 y/o female, followed by Dr. Saunders Revel, with known CAD s/p recent [facility] 08/29/16, revealing significant coronary artery disease involving the LAD and LCx, including chronic total occlusion of the mid LAD and heavily calcified 80% stenosis in the proximal LCx extending into large OM1 branch. She underwent successful but complex PCI with atherectomy of the proximal LCx and OM1 followed by placement of a DES. Medical therapy was elected for her LAD CTO. EF was also notably reduced at 35-40%. She was placed on DAPT with ASA + Plavix, along with Lipitor, and low dose Coreg and lisinopril. No PRN SL NTG was prescribed. She reports that she has been fully complaint with her cardiac meds.    She has been doing well until recently. Several days ago, she had an episode of exertional CP that resolved with rest, however she did not tell anyone. Today during cardiac rehab, she had exertional chest pressure while exercising on the stair climber. RN was notified. Exercise was stopped and EKG was obtained. This shows NSR with TWIs in V4-V6, unchanged from EKG that was obtain after her PCI on 08/30/16. Her CP resolved with rest. She denies any resting symptoms. HR is in the 65V-37S and systolic BP in the 827M. I have instructed patient to increase her antianginal therapy. I've advised that she increase her Coreg to 6.25 mg BID as this will also help with LV dysfunction. Pt advised to monitor for bradycardia and hypotension and to report any symptoms. I recommended that she arrange close f/u with an APP in our office end of the week for repeat assessment. I also placed a Rx for PRN SL NTG and reviewed instructions with patient. The patient and her husband are in agreement with plan and verbalized understanding.  Pam [name] 10/19/2016 11:30  AM

## 2016-10-19 NOTE — Progress Notes (Signed)
Pam [name] reported that she had some chest discomfort today while on the nustep today at cardiac rehab. Pam [name] said the discomfort lasted about 5 minutes. Pam [name] reports the pain as a throbbing and rated it a 5 on a 1-10 scale. Pam [name] reports that the pain went away after she stopped exercising on the nustep and walked on the walking track. Pam [name] also reported feeling a little diaphoretic after walking the track. Exercise stopped. Blood pressure 118/70. Heart rate 84 Sinus rhythm. Oxygen saturation 99% on room air. 12 lead ECG obtained. Harrell Lark PA called and notified. [name] also told me she had chest pain on Monday on the nustep but did not notify the staff when it happened. [name] came down to cardiac rehab reviewed the 12 lead ECG  and talked with Pam [name] and Pam [name]. [name] increased Pam [name]'s coreg to 6.25 mg twice a day. Appointment made for Pam [name] to follow up with Dr End on Friday at 1:20 pm. Pam [name] will hold off on exercise until she is seen by Dr End. We will await instructions on when Pam [name] can resume exercise at cardiac rehab.  Pam [name] left cardiac rehab without complaints or chest pain. Exit blood pressure 122/74.Will fax exercise flow sheets to Dr. Darnelle Bos office for review with today's ECG tracing. Pam [name] does not have a prescription for sublingual nitroglycerin. Pam [name] instructed Pam [name] how to use sublingual nitroglycerin and said she will call in a prescription to the drug store.Barnet Pall, RN,BSN 10/19/2016 12:33 PM

## 2016-10-19 NOTE — Telephone Encounter (Signed)
SENT NOTES TO SCHEDULING 

## 2016-10-19 NOTE — Telephone Encounter (Signed)
SENT NOTES TO SCHEDULING WAS AN ERROR

## 2016-10-21 ENCOUNTER — Encounter (HOSPITAL_COMMUNITY): Admission: RE | Admit: 2016-10-21 | Payer: Federal, State, Local not specified - PPO | Source: Ambulatory Visit

## 2016-10-21 ENCOUNTER — Encounter (HOSPITAL_COMMUNITY): Payer: Federal, State, Local not specified - PPO

## 2016-10-21 ENCOUNTER — Encounter: Payer: Self-pay | Admitting: Internal Medicine

## 2016-10-21 ENCOUNTER — Ambulatory Visit (INDEPENDENT_AMBULATORY_CARE_PROVIDER_SITE_OTHER): Payer: Federal, State, Local not specified - PPO | Admitting: Internal Medicine

## 2016-10-21 VITALS — BP 122/78 | HR 74 | Ht 63.0 in | Wt 126.2 lb

## 2016-10-21 DIAGNOSIS — I5022 Chronic systolic (congestive) heart failure: Secondary | ICD-10-CM

## 2016-10-21 DIAGNOSIS — I25119 Atherosclerotic heart disease of native coronary artery with unspecified angina pectoris: Secondary | ICD-10-CM | POA: Diagnosis not present

## 2016-10-21 DIAGNOSIS — I255 Ischemic cardiomyopathy: Secondary | ICD-10-CM | POA: Diagnosis not present

## 2016-10-21 MED ORDER — CARVEDILOL 6.25 MG PO TABS: 6.2500 mg | ORAL_TABLET | Freq: Two times a day (BID) | ORAL | 1 refills | Status: DC

## 2016-10-21 NOTE — Progress Notes (Addendum)
Follow-up Outpatient Visit Date: 10/21/2016  Primary Care Provider: Libby Maw, MD [address] [place] [address]  Chief Complaint: Chest pain, shortness of breath, and dizziness  HPI:  [name] is a 66 y.o. year-old female with history of coronary artery disease, ischemic cardiomyopathy, hyperlipidemia, and prior tobacco use, who presents for follow-up after NSTEMI in 07/2016 and subsequent PCI to the LCx in 08/2016. The patient initially presented after a mechanical fall during which she fell forward while taking out her trash, striking her face and for head on her driveway. She had difficulty getting up and was ultimately brought to the emergency department by EMS. EKG demonstrated subtle anterolateral ST elevations, though the patient was chest pain-free. Cardiac catheterization was initially deferred but subsequently performed due to mild troponin elevation, which peaked at 0.27. Catheterization revealed chronic total occlusion of the mid LAD as well as high-grade disease of the proximal LCx. Cardiac MRI revealed anterior and apical scar without viable myocardium and an LVEF of 38%. Due to her lack of cardiac symptoms and recent fall with multiple facial abrasions and lacerations, the decision was made to defer intervention to the LCx. Staged PCI to the LCx with orbital atherectomy was subsequently performed on 08/29/16.  [name] had been doing well post-PCI. She began cardiac rehab last week and was progressing well. However, she noted chest pain while exercising at cardiac rehab during her last 2 session. Two days ago, she mentioned this to the rehab staff, prompting EKG and urgent evaluation by Lyda Jester, PA. Evaluation was unremarkable and carvedilol was increased to 6.25 mg BID, which the patient has been taking for the last 1.5 days without adverse effects. She has not had any further episodes of chest pain, but notes that she has not performed any strenuous  activities outside of cardiac rehab. She denies shortness of breath, lightheadedness, and edema. Her heart "beats fast" when she exercises, though she otherwise denies palpitations. She remains compliant with dual antiplatelet therapy with aspirin and clopidogrel.  --------------------------------------------------------------------------------------------------  Cardiovascular History & Procedures: Cardiovascular Problems:  Coronary artery disease with NSTEMI and chronic total occlusion of the proximal/mid LAD  Ischemic cardiomyopathy  Risk Factors:  Known coronary artery disease, hyperlipidemia, tobacco use, age  Cath/PCI:  PCI (08/29/16): Significant left coronary artery disease, including CTO of the mid LAD and 80% calcified proximal LCx stenosis extending into large OM1 branch. Successful IVUS-guided PCI utilizing orbital atherectomy with placement of a Synergy 3.0 x 32 mm drug-eluting stent. Normal left ventricular filling pressure. Significant right radial artery spasm precluded guide catheter advancement. Therefore, femoral access was used for the intervention.  LHC (07/08/16): Significant two-vessel coronary artery disease including totally calcified chronic total occlusion of the proximal/mid LAD and calcified 80% stenosis of proximal LCx/OM one. LVEF 30% with anterior, apical, and apical inferior disc low normal LVEDP (4 mmHg).  CV Surgery:  None  EP Procedures and Devices:  None  Non-Invasive Evaluation(s):  Right groin ultrasound (09/16/16): Patent arteries and veins without pseudoaneurysm, AV fistula, or obstruction.  Cardiac MRI (07/11/16): Normal LV size and wall thickness with moderately reduced contraction (EF 38%). Akinesis/dyskinesis of the mid and apical anterior/anteroseptal and apical segments. LAD territory is nonviable. RCA and LCx territories are viable. Normal RV size and function. Normal biatrial size.  Transthoracic echocardiogram (07/08/16): Normal LV size  and wall thickness with LVEF of 35-40%. Anterior and apical wall motion abnormality present. Grade 1 diastolic dysfunction. Normal RV size and function. No significant valvular abnormalities.  Recent CV Pertinent Labs: Lab Results  Component Value Date   CHOL 174 07/07/2016   HDL 87 07/07/2016   LDLCALC 70 07/07/2016   TRIG 87 07/07/2016   CHOLHDL 2.0 07/07/2016   INR 1.1 08/25/2016   K 3.8 08/30/2016   BUN 10 08/30/2016   BUN 12 08/25/2016   CREATININE 0.67 08/30/2016    Past medical and surgical history were reviewed and updated in EPIC.  Outpatient Encounter Prescriptions as of 10/21/2016  Medication Sig  . acetaminophen (TYLENOL) 650 MG CR tablet Take 650 mg by mouth every 8 (eight) hours as needed for pain (headache).  Marland Kitchen aspirin 81 MG chewable tablet Chew 1 tablet (81 mg total) by mouth daily.  Marland Kitchen atorvastatin (LIPITOR) 40 MG tablet Take 1 tablet (40 mg total) by mouth daily.  . carvedilol (COREG) 3.125 MG tablet Take 3.125 mg by mouth 4 (four) times daily.  . clopidogrel (PLAVIX) 75 MG tablet Take 1 tablet (75 mg total) by mouth daily with breakfast.  . levothyroxine (SYNTHROID, LEVOTHROID) 25 MCG tablet Take 37.5 mcg by mouth daily before breakfast.  . lisinopril (PRINIVIL,ZESTRIL) 5 MG tablet Take 1 tablet (5 mg total) by mouth daily.  . nitroGLYCERIN (NITROSTAT) 0.4 MG SL tablet Place 1 tablet (0.4 mg total) under the tongue every 5 (five) minutes as needed for chest pain.  . [DISCONTINUED] carvedilol (COREG) 6.25 MG tablet Take 1 tablet (6.25 mg total) by mouth 2 (two) times daily with a meal.   No facility-administered encounter medications on file as of 10/21/2016.     Allergies: Patient has no known allergies.  Social History   Social History  . Marital status: Married    Spouse name: N/A  . Number of children: 2  . Years of education: N/A   Occupational History  . Retired-worked for [employer]    Social History Main Topics  . Smoking status: Former Smoker      Packs/day: 1.00    Years: 50.00    Types: Cigarettes    Quit date: 07/07/2016  . Smokeless tobacco: Never Used  . Alcohol use No  . Drug use: No  . Sexual activity: Not on file   Other Topics Concern  . Not on file   Social History Narrative  . No narrative on file    Family History  Problem Relation Age of Onset  . Kidney failure Mother     Review of Systems: Right groin soreness and bruising have resolved. A 12-system review of systems was performed and was negative except as noted in the HPI.  --------------------------------------------------------------------------------------------------  Physical Exam: BP 122/78   Pulse 74   Ht 5\' 3"  (1.6 m)   Wt 126 lb 4 oz (57.3 kg)   SpO2 98%   BMI 22.36 kg/m   General:  Well-developed, well-nourished woman seated comfortably in the exam room. HEENT: No conjunctival pallor or scleral icterus.  Moist mucous membranes.  OP clear. Bruising under the right eye is present, improved from prior visit last month. Neck: Supple without lymphadenopathy, thyromegaly, JVD, or HJR. Lungs: Normal work of breathing.  Clear to auscultation bilaterally without wheezes or crackles. Heart: Regular rate and rhythm without murmurs, rubs, or gallops.  Non-displaced PMI. Abd: Bowel sounds present.  Soft, NT/ND without hepatosplenomegaly Ext: No lower extremity edema.  Radial, PT, and DP pulses are 2+ bilaterally.  Skin: warm and dry.  EKG: Normal sinus rhythm with septal MI and non-specific T-wave changes. No significant change from prior tracing on 10/19/16 (I  have personally reviewed both tracings).  Lab Results  Component Value Date   WBC 7.3 08/30/2016   HGB 11.5 (L) 08/30/2016   HCT 33.8 (L) 08/30/2016   MCV 87.8 08/30/2016   PLT 219 08/30/2016    Lab Results  Component Value Date   NA 134 (L) 08/30/2016   K 3.8 08/30/2016   CL 102 08/30/2016   CO2 26 08/30/2016   BUN 10 08/30/2016   CREATININE 0.67 08/30/2016   GLUCOSE 109 (H)  08/30/2016   ALT 27 07/07/2016    Lab Results  Component Value Date   CHOL 174 07/07/2016   HDL 87 07/07/2016   LDLCALC 70 07/07/2016   TRIG 87 07/07/2016   CHOLHDL 2.0 07/07/2016    --------------------------------------------------------------------------------------------------  ASSESSMENT AND PLAN: Coronary artery disease with angina pectoris The patient reports exertional chest pain at her last two cardiac rehab sessions, most recently 2 days ago. She notes that she had been pushing herself quite hard at the time that the pain began. The pain resolved over the course of a few minutes with rest. EKG at the time was stable and remains unchanged today. Carvedilol was increased to 6.25 mg BID at that time, which the patient is tolerating well. She is known to have a chronic total occlusion of the proximal/mid LAD with non-viable myocardium. LCx and RCA of mild to moderate disease involving small branches. I suspect these findings and deconditioning are responsible for her angina. We will continue with increased dose of carvedilol. I do not feel that a stress test will be helpful, given abnormal EKG at baseline and large anterior scar on cardiac MRI. I think it is reasonable for the patient to return to cardiac rehab but to pace herself a little more slowly. If she continues to have angina, we will consider a trial of isosorbide mononitrate or ranolazine if she has side effects with nitrates. She has a prescription for sublingual nitroglycerin to be taken as needed for chest pain. We will continue with DAPT for at least 12 months from LCx stent.  Ischemic cardiomyopathy with chronic systolic heart failure Ms. Kail appears euvolemic and well-compensated with NYHA class II symptoms. We will continue carvedilol 6.25 mg BID and lisinopril 5 mg daily; we will discuss dose escalation when the patient returns for follow-up.  Follow-up: Return to clinic in 4-6 weeks.  Pam Bush,  MD 10/21/2016 9:00 PM

## 2016-10-21 NOTE — Patient Instructions (Signed)
Medication Instructions:  Your physician recommends that you continue on your current medications as directed. Please refer to the Current Medication list given to you today.   Labwork: None   Testing/Procedures: None   Follow-Up: Your physician recommends that you schedule a follow-up appointment on Friday April 20,2018 at 11:20 AM.        If you need a refill on your cardiac medications before your next appointment, please call your pharmacy.

## 2016-10-24 ENCOUNTER — Encounter (HOSPITAL_COMMUNITY)
Admission: RE | Admit: 2016-10-24 | Discharge: 2016-10-24 | Disposition: A | Discharge status: Home / Self Care | Payer: Federal, State, Local not specified - PPO | Source: Ambulatory Visit | Attending: Internal Medicine | Admitting: Internal Medicine

## 2016-10-24 ENCOUNTER — Encounter (HOSPITAL_COMMUNITY): Payer: Federal, State, Local not specified - PPO

## 2016-10-24 DIAGNOSIS — Z955 Presence of coronary angioplasty implant and graft: Secondary | ICD-10-CM

## 2016-10-24 NOTE — Progress Notes (Signed)
Pam [name] returned to exercise at cardiac rehab today. Vital signs stable. Pam [name] had no complaints of chest pain. Will continue to monitor the patient throughout  the program. Barnet Pall, RN,BSN 10/24/2016 4:23 PM

## 2016-10-25 NOTE — Progress Notes (Signed)
Cardiac Individual Treatment Plan  Patient Details  Name: Pam [name] MRN: [id number] Date of Birth: [birth year] Referring Provider:     CARDIAC REHAB PHASE II ORIENTATION from 09/27/2016 in [facility]  Referring Provider  End, Harrell Gave MD      Initial Encounter Date:    CARDIAC REHAB PHASE II ORIENTATION from 09/27/2016 in [place]  Date  09/27/16  Referring Provider  End, Harrell Gave MD      Visit Diagnosis: 08/29/16 Status post coronary artery stent placement  Patient's Home Medications on Admission:  Current Outpatient Prescriptions:  .  acetaminophen (TYLENOL) 650 MG CR tablet, Take 650 mg by mouth every 8 (eight) hours as needed for pain (headache)., Disp: , Rfl:  .  aspirin 81 MG chewable tablet, Chew 1 tablet (81 mg total) by mouth daily., Disp: 30 tablet, Rfl: 0 .  atorvastatin (LIPITOR) 40 MG tablet, Take 1 tablet (40 mg total) by mouth daily., Disp: 30 tablet, Rfl: 0 .  carvedilol (COREG) 6.25 MG tablet, Take 1 tablet (6.25 mg total) by mouth 2 (two) times daily., Disp: 180 tablet, Rfl: 1 .  clopidogrel (PLAVIX) 75 MG tablet, Take 1 tablet (75 mg total) by mouth daily with breakfast., Disp: 30 tablet, Rfl: 11 .  levothyroxine (SYNTHROID, LEVOTHROID) 25 MCG tablet, Take 37.5 mcg by mouth daily before breakfast., Disp: , Rfl:  .  lisinopril (PRINIVIL,ZESTRIL) 5 MG tablet, Take 1 tablet (5 mg total) by mouth daily., Disp: 90 tablet, Rfl: 1 .  nitroGLYCERIN (NITROSTAT) 0.4 MG SL tablet, Place 1 tablet (0.4 mg total) under the tongue every 5 (five) minutes as needed for chest pain., Disp: 90 tablet, Rfl: 3  Past Medical History: Past Medical History:  Diagnosis Date  . Hyperlipidemia   . Thyroid disease     Tobacco Use: History  Smoking Status  . Former Smoker  . Packs/day: 1.00  . Years: 50.00  . Types: Cigarettes  . Quit date: 07/07/2016  Smokeless Tobacco  . Never Used    Labs: Recent Review  Flowsheet Data    Labs for ITP Cardiac and Pulmonary Rehab Latest Ref Rng & Units 07/07/2016 07/09/2016   Cholestrol 0 - 200 mg/dL 174 -   LDLCALC 0 - 99 mg/dL 70 -   HDL >40 mg/dL 87 -   Trlycerides <150 mg/dL 87 -   Hemoglobin A1c 4.8 - 5.6 % - 5.3      Capillary Blood Glucose: No results found for: GLUCAP   Exercise Target Goals:    Exercise Program Goal: Individual exercise prescription set with THRR, safety & activity barriers. Participant demonstrates ability to understand and report RPE using BORG scale, to self-measure pulse accurately, and to acknowledge the importance of the exercise prescription.  Exercise Prescription Goal: Starting with aerobic activity 30 plus minutes a day, 3 days per week for initial exercise prescription. Provide home exercise prescription and guidelines that participant acknowledges understanding prior to discharge.  Activity Barriers & Risk Stratification:     Activity Barriers & Cardiac Risk Stratification - 09/27/16 1112      Activity Barriers & Cardiac Risk Stratification   Activity Barriers None   Cardiac Risk Stratification Moderate      6 Minute Walk:     6 Minute Walk    Row Name 09/27/16 1029         6 Minute Walk   Phase Initial     Distance 1178 feet     Walk Time 6  minutes     # of Rest Breaks 0     MPH 2.23     METS 3.01     RPE 13     VO2 Peak 10.53     Symptoms Yes (comment)     Comments Patient c/o dyspnea.     Resting HR 68 bpm     Resting BP 132/63     Max Ex. HR 98 bpm     Max Ex. BP 114/60     2 Minute Post BP 120/70        Oxygen Initial Assessment:   Oxygen Re-Evaluation:   Oxygen Discharge (Final Oxygen Re-Evaluation):   Initial Exercise Prescription:     Initial Exercise Prescription - 09/27/16 1000      Date of Initial Exercise RX and Referring Provider   Date 09/27/16   Referring Provider End, Harrell Gave MD     Bike   Level 0.5   Minutes 10   METs 2.72     Recumbant Bike    Level 2   RPM 20   Watts 10   Minutes 10   METs 1.4     NuStep   Level 2   SPM 70   Minutes 10   METs 2     Track   Laps 7   Minutes 10   METs 2.23     Prescription Details   Frequency (times per week) 3   Duration Progress to 30 minutes of continuous aerobic without signs/symptoms of physical distress     Intensity   THRR 40-80% of Max Heartrate 62-124   Ratings of Perceived Exertion 11-13   Perceived Dyspnea 0-4     Progression   Progression Continue to progress workloads to maintain intensity without signs/symptoms of physical distress.     Resistance Training   Training Prescription Yes   Weight 2lbs   Reps 10-15      Perform Capillary Blood Glucose checks as needed.  Exercise Prescription Changes:     Exercise Prescription Changes    Row Name 10/17/16 1600             Response to Exercise   Blood Pressure (Admit) 122/76       Blood Pressure (Exercise) 108/78       Blood Pressure (Exit) 104/60       Heart Rate (Admit) 74 bpm       Heart Rate (Exercise) 126 bpm       Heart Rate (Exit) 78 bpm       Rating of Perceived Exertion (Exercise) 11       Duration Progress to 45 minutes of aerobic exercise without signs/symptoms of physical distress       Intensity THRR unchanged         Progression   Progression Continue to progress workloads to maintain intensity without signs/symptoms of physical distress.       Average METs 2.9         Resistance Training   Training Prescription Yes       Weight 2lbs       Reps 10-15         Recumbant Bike   Level 2       RPM 20       Watts 10       Minutes 10       METs 2.6         NuStep   Level 4       SPM 70  Minutes 10       METs 3.9         Track   Laps 14       Minutes 10       METs 3.43         Home Exercise Plan   Plans to continue exercise at Home (comment)       Frequency Add 4 additional days to program exercise sessions.       Initial Home Exercises Provided 10/12/16           Exercise Comments:     Exercise Comments    Row Name 10/24/16 1619           Exercise Comments Reviewed METs and goals with pt.  Pt is doing well with exercise.           Exercise Goals and Review:     Exercise Goals    Row Name 09/27/16 0849             Exercise Goals   Increase Physical Activity Yes       Intervention Provide advice, education, support and counseling about physical activity/exercise needs.;Develop an individualized exercise prescription for aerobic and resistive training based on initial evaluation findings, risk stratification, comorbidities and participant's personal goals.       Expected Outcomes Achievement of increased cardiorespiratory fitness and enhanced flexibility, muscular endurance and strength shown through measurements of functional capacity and personal statement of participant.       Increase Strength and Stamina Yes       Intervention Provide advice, education, support and counseling about physical activity/exercise needs.;Develop an individualized exercise prescription for aerobic and resistive training based on initial evaluation findings, risk stratification, comorbidities and participant's personal goals.       Expected Outcomes Achievement of increased cardiorespiratory fitness and enhanced flexibility, muscular endurance and strength shown through measurements of functional capacity and personal statement of participant.          Exercise Goals Re-Evaluation :     Exercise Goals Re-Evaluation    Row Name 10/24/16 1613             Exercise Goal Re-Evaluation   Exercise Goals Review Increase Physical Activity;Increase Strenth and Stamina       Comments Pt states that she feels great and she is tolerating workload increases very well.  Unfortunately, due to some chest pain, Nadelyn has had to decrease some of her workloads and we will gradually reintroduce WL increases as tolerated. w/o signs/symptoms.       Expected Outcomes  Continue with exercise prescription in order to continue to build strength, increase stamina and overall cardiorespiratory fitness.             Discharge Exercise Prescription (Final Exercise Prescription Changes):     Exercise Prescription Changes - 10/17/16 1600      Response to Exercise   Blood Pressure (Admit) 122/76   Blood Pressure (Exercise) 108/78   Blood Pressure (Exit) 104/60   Heart Rate (Admit) 74 bpm   Heart Rate (Exercise) 126 bpm   Heart Rate (Exit) 78 bpm   Rating of Perceived Exertion (Exercise) 11   Duration Progress to 45 minutes of aerobic exercise without signs/symptoms of physical distress   Intensity THRR unchanged     Progression   Progression Continue to progress workloads to maintain intensity without signs/symptoms of physical distress.   Average METs 2.9     Resistance Training   Training Prescription Yes  Weight 2lbs   Reps 10-15     Recumbant Bike   Level 2   RPM 20   Watts 10   Minutes 10   METs 2.6     NuStep   Level 4   SPM 70   Minutes 10   METs 3.9     Track   Laps 14   Minutes 10   METs 3.43     Home Exercise Plan   Plans to continue exercise at Home (comment)   Frequency Add 4 additional days to program exercise sessions.   Initial Home Exercises Provided 10/12/16      Nutrition:  Target Goals: Understanding of nutrition guidelines, daily intake of sodium 1500mg , cholesterol 200mg , calories 30% from fat and 7% or less from saturated fats, daily to have 5 or more servings of fruits and vegetables.  Biometrics:     Pre Biometrics - 09/27/16 0841      Pre Biometrics   Waist Circumference 28.5 inches   Hip Circumference 38 inches   Waist to Hip Ratio 0.75 %   Triceps Skinfold 24 mm   % Body Fat 33 %   Grip Strength 22 kg   Flexibility 13.25 in   Single Leg Stand 30 seconds       Nutrition Therapy Plan and Nutrition Goals:     Nutrition Therapy & Goals - 10/17/16 1041      Nutrition Therapy   Diet  Therapeutic Lifestyle Changes     Personal Nutrition Goals   Nutrition Goal Pt to maintain her wt while in Cardiac Rehab   Personal Goal #2 Pt to identify and limit foods high in sodium     Intervention Plan   Intervention Prescribe, educate and counsel regarding individualized specific dietary modifications aiming towards targeted core components such as weight, hypertension, lipid management, diabetes, heart failure and other comorbidities.;Nutrition handout(s) given to patient.  Heart Failure Nutrition Therapy   Expected Outcomes Short Term Goal: Understand basic principles of dietary content, such as calories, fat, sodium, cholesterol and nutrients.;Long Term Goal: Adherence to prescribed nutrition plan.      Nutrition Discharge: Nutrition Scores:     Nutrition Assessments - 10/17/16 1040      Rate Your Plate Scores   Pre Score 31      Nutrition Goals Re-Evaluation:   Nutrition Goals Re-Evaluation:   Nutrition Goals Discharge (Final Nutrition Goals Re-Evaluation):   Psychosocial: Target Goals: Acknowledge presence or absence of significant depression and/or stress, maximize coping skills, provide positive support system. Participant is able to verbalize types and ability to use techniques and skills needed for reducing stress and depression.  Initial Review & Psychosocial Screening:     Initial Psych Review & Screening - 10/24/16 1630      Screening Interventions   Interventions Encouraged to exercise      Quality of Life Scores:     Quality of Life - 09/27/16 1007      Quality of Life Scores   Health/Function Pre 23.1 %   Socioeconomic Pre 26 %   Psych/Spiritual Pre 24 %   Family Pre 26.4 %   GLOBAL Pre 24.32 %      PHQ-9: Recent Review Flowsheet Data    There is no flowsheet data to display.     Interpretation of Total Score  Total Score Depression Severity:  1-4 = Minimal depression, 5-9 = Mild depression, 10-14 = Moderate depression, 15-19 =  Moderately severe depression, 20-27 = Severe depression   Psychosocial  Evaluation and Intervention:   Psychosocial Re-Evaluation:     Psychosocial Re-Evaluation    [place] Name 10/25/16 1319             Psychosocial Re-Evaluation   Current issues with None Identified       Interventions Stress management education       Continue Psychosocial Services  No Follow up required          Psychosocial Discharge (Final Psychosocial Re-Evaluation):     Psychosocial Re-Evaluation - 10/25/16 1319      Psychosocial Re-Evaluation   Current issues with None Identified   Interventions Stress management education   Continue Psychosocial Services  No Follow up required      Vocational Rehabilitation: Provide vocational rehab assistance to qualifying candidates.   Vocational Rehab Evaluation & Intervention:     Vocational Rehab - 10/24/16 1623      Initial Vocational Rehab Evaluation & Intervention   Assessment shows need for Vocational Rehabilitation --      Education: Education Goals: Education classes will be provided on a weekly basis, covering required topics. Participant will state understanding/return demonstration of topics presented.  Learning Barriers/Preferences:     Learning Barriers/Preferences - 09/27/16 0848      Learning Barriers/Preferences   Learning Barriers Sight   Learning Preferences Written Material;Video;Skilled Demonstration      Education Topics: Count Your Pulse:  -Group instruction provided by verbal instruction, demonstration, patient participation and written materials to support subject.  Instructors address importance of being able to find your pulse and how to count your pulse when at home without a heart monitor.  Patients get hands on experience counting their pulse with staff help and individually.   Heart Attack, Angina, and Risk Factor Modification:  -Group instruction provided by verbal instruction, video, and written materials to  support subject.  Instructors address signs and symptoms of angina and heart attacks.    Also discuss risk factors for heart disease and how to make changes to improve heart health risk factors.   Functional Fitness:  -Group instruction provided by verbal instruction, demonstration, patient participation, and written materials to support subject.  Instructors address safety measures for doing things around the house.  Discuss how to get up and down off the floor, how to pick things up properly, how to safely get out of a chair without assistance, and balance training.   Meditation and Mindfulness:  -Group instruction provided by verbal instruction, patient participation, and written materials to support subject.  Instructor addresses importance of mindfulness and meditation practice to help reduce stress and improve awareness.  Instructor also leads participants through a meditation exercise.    Stretching for Flexibility and Mobility:  -Group instruction provided by verbal instruction, patient participation, and written materials to support subject.  Instructors lead participants through series of stretches that are designed to increase flexibility thus improving mobility.  These stretches are additional exercise for major muscle groups that are typically performed during regular warm up and cool down.   Hands Only CPR Anytime:  -Group instruction provided by verbal instruction, video, patient participation and written materials to support subject.  Instructors co-teach with AHA video for hands only CPR.  Participants get hands on experience with mannequins.   Nutrition I class: Heart Healthy Eating:  -Group instruction provided by PowerPoint slides, verbal discussion, and written materials to support subject matter. The instructor gives an explanation and review of the Therapeutic Lifestyle Changes diet recommendations, which includes a discussion on lipid goals, dietary  fat, sodium, fiber,  plant stanol/sterol esters, sugar, and the components of a well-balanced, healthy diet.   CARDIAC REHAB PHASE II EXERCISE from 10/12/2016 in [place]  Date  10/04/16  Educator  RD  Instruction Review Code  2- meets goals/outcomes      Nutrition II class: Lifestyle Skills:  -Group instruction provided by PowerPoint slides, verbal discussion, and written materials to support subject matter. The instructor gives an explanation and review of label reading, grocery shopping for heart health, heart healthy recipe modifications, and ways to make healthier choices when eating out.   Diabetes Question & Answer:  -Group instruction provided by PowerPoint slides, verbal discussion, and written materials to support subject matter. The instructor gives an explanation and review of diabetes co-morbidities, pre- and post-prandial blood glucose goals, pre-exercise blood glucose goals, signs, symptoms, and treatment of hypoglycemia and hyperglycemia, and foot care basics.   Diabetes Blitz:  -Group instruction provided by PowerPoint slides, verbal discussion, and written materials to support subject matter. The instructor gives an explanation and review of the physiology behind type 1 and type 2 diabetes, diabetes medications and rational behind using different medications, pre- and post-prandial blood glucose recommendations and Hemoglobin A1c goals, diabetes diet, and exercise including blood glucose guidelines for exercising safely.    Portion Distortion:  -Group instruction provided by PowerPoint slides, verbal discussion, written materials, and food models to support subject matter. The instructor gives an explanation of serving size versus portion size, changes in portions sizes over the last 20 years, and what consists of a serving from each food group.   Stress Management:  -Group instruction provided by verbal instruction, video, and written materials to support  subject matter.  Instructors review role of stress in heart disease and how to cope with stress positively.     CARDIAC REHAB PHASE II EXERCISE from 10/12/2016 in [place]  Date  10/05/16  Instruction Review Code  2- meets goals/outcomes      Exercising on Your Own:  -Group instruction provided by verbal instruction, power point, and written materials to support subject.  Instructors discuss benefits of exercise, components of exercise, frequency and intensity of exercise, and end points for exercise.  Also discuss use of nitroglycerin and activating EMS.  Review options of places to exercise outside of rehab.  Review guidelines for sex with heart disease.   Cardiac Drugs I:  -Group instruction provided by verbal instruction and written materials to support subject.  Instructor reviews cardiac drug classes: antiplatelets, anticoagulants, beta blockers, and statins.  Instructor discusses reasons, side effects, and lifestyle considerations for each drug class.   Cardiac Drugs II:  -Group instruction provided by verbal instruction and written materials to support subject.  Instructor reviews cardiac drug classes: angiotensin converting enzyme inhibitors (ACE-I), angiotensin II receptor blockers (ARBs), nitrates, and calcium channel blockers.  Instructor discusses reasons, side effects, and lifestyle considerations for each drug class.   CARDIAC REHAB PHASE II EXERCISE from 10/12/2016 in [place]  Date  10/12/16  Educator  pharm  Instruction Review Code  2- meets goals/outcomes      Anatomy and Physiology of the Circulatory System:  -Group instruction provided by verbal instruction, video, and written materials to support subject.  Reviews functional anatomy of heart, how it relates to various diagnoses, and what role the heart plays in the overall system.   Knowledge Questionnaire Score:     Knowledge Questionnaire Score -  09/27/16 1007      Knowledge Questionnaire Score   Pre Score 20/24      Core Components/Risk Factors/Patient Goals at Admission:     Personal Goals and Risk Factors at Admission - 09/27/16 1027      Core Components/Risk Factors/Patient Goals on Admission   Improve shortness of breath with ADL's Yes   Intervention Provide education, individualized exercise plan and daily activity instruction to help decrease symptoms of SOB with activities of daily living.   Expected Outcomes Short Term: Achieves a reduction of symptoms when performing activities of daily living.   Heart Failure Yes   Intervention Provide a combined exercise and nutrition program that is supplemented with education, support and counseling about heart failure. Directed toward relieving symptoms such as shortness of breath, decreased exercise tolerance, and extremity edema.   Expected Outcomes Improve functional capacity of life;Short term: Attendance in program 2-3 days a week with increased exercise capacity. Reported lower sodium intake. Reported increased fruit and vegetable intake. Reports medication compliance.;Short term: Daily weights obtained and reported for increase. Utilizing diuretic protocols set by physician.;Long term: Adoption of self-care skills and reduction of barriers for early signs and symptoms recognition and intervention leading to self-care maintenance.   Lipids Yes   Intervention Provide education and support for participant on nutrition & aerobic/resistive exercise along with prescribed medications to achieve LDL 70mg , HDL >40mg .   Expected Outcomes Short Term: Participant states understanding of desired cholesterol values and is compliant with medications prescribed. Participant is following exercise prescription and nutrition guidelines.;Long Term: Cholesterol controlled with medications as prescribed, with individualized exercise RX and with personalized nutrition plan. Value goals: LDL < 70mg , HDL >  40 mg.   Stress Yes   Intervention Offer individual and/or small group education and counseling on adjustment to heart disease, stress management and health-related lifestyle change. Teach and support self-help strategies.;Refer participants experiencing significant psychosocial distress to appropriate mental health specialists for further evaluation and treatment. When possible, include family members and significant others in education/counseling sessions.  medical stress   Expected Outcomes Short Term: Participant demonstrates changes in health-related behavior, relaxation and other stress management skills, ability to obtain effective social support, and compliance with psychotropic medications if prescribed.;Long Term: Emotional wellbeing is indicated by absence of clinically significant psychosocial distress or social isolation.      Core Components/Risk Factors/Patient Goals Review:    Core Components/Risk Factors/Patient Goals at Discharge (Final Review):    ITP Comments:     ITP Comments    Row Name 09/27/16 0840           ITP Comments Dr. Fransico Him, Medical Director          Comments: Toia  is making expected progress toward personal goals after completing 9 sessions. Recommend continued exercise and life style modification education including  stress management and relaxation techniques to decrease cardiac risk profile. We will continue to have Daurice progress slowly and monitor her for any complaints of chest pain.Barnet Pall, RN,BSN 10/25/2016 1:34 PM

## 2016-10-26 ENCOUNTER — Encounter (HOSPITAL_COMMUNITY): Payer: Federal, State, Local not specified - PPO

## 2016-10-26 ENCOUNTER — Encounter (HOSPITAL_COMMUNITY)
Admission: RE | Admit: 2016-10-26 | Discharge: 2016-10-26 | Disposition: A | Discharge status: Home / Self Care | Payer: Federal, State, Local not specified - PPO | Source: Ambulatory Visit | Attending: Internal Medicine | Admitting: Internal Medicine

## 2016-10-26 DIAGNOSIS — Z955 Presence of coronary angioplasty implant and graft: Secondary | ICD-10-CM

## 2016-10-28 ENCOUNTER — Encounter (HOSPITAL_COMMUNITY): Payer: Federal, State, Local not specified - PPO

## 2016-10-28 ENCOUNTER — Encounter (HOSPITAL_COMMUNITY)
Admission: RE | Admit: 2016-10-28 | Discharge: 2016-10-28 | Disposition: A | Discharge status: Home / Self Care | Payer: Federal, State, Local not specified - PPO | Source: Ambulatory Visit | Attending: Internal Medicine | Admitting: Internal Medicine

## 2016-10-28 DIAGNOSIS — Z955 Presence of coronary angioplasty implant and graft: Secondary | ICD-10-CM | POA: Diagnosis not present

## 2016-10-31 ENCOUNTER — Encounter (HOSPITAL_COMMUNITY): Payer: Federal, State, Local not specified - PPO

## 2016-10-31 ENCOUNTER — Encounter (HOSPITAL_COMMUNITY)
Admission: RE | Admit: 2016-10-31 | Discharge: 2016-10-31 | Disposition: A | Discharge status: Home / Self Care | Payer: Federal, State, Local not specified - PPO | Source: Ambulatory Visit | Attending: Internal Medicine | Admitting: Internal Medicine

## 2016-10-31 DIAGNOSIS — Z79899 Other long term (current) drug therapy: Secondary | ICD-10-CM | POA: Insufficient documentation

## 2016-10-31 DIAGNOSIS — Z87891 Personal history of nicotine dependence: Secondary | ICD-10-CM | POA: Insufficient documentation

## 2016-10-31 DIAGNOSIS — Z7902 Long term (current) use of antithrombotics/antiplatelets: Secondary | ICD-10-CM | POA: Insufficient documentation

## 2016-10-31 DIAGNOSIS — Z7982 Long term (current) use of aspirin: Secondary | ICD-10-CM | POA: Insufficient documentation

## 2016-10-31 DIAGNOSIS — Z955 Presence of coronary angioplasty implant and graft: Secondary | ICD-10-CM | POA: Insufficient documentation

## 2016-11-02 ENCOUNTER — Encounter (HOSPITAL_COMMUNITY): Payer: Federal, State, Local not specified - PPO

## 2016-11-02 ENCOUNTER — Encounter (HOSPITAL_COMMUNITY)
Admission: RE | Admit: 2016-11-02 | Discharge: 2016-11-02 | Disposition: A | Discharge status: Home / Self Care | Payer: Federal, State, Local not specified - PPO | Source: Ambulatory Visit | Attending: Internal Medicine | Admitting: Internal Medicine

## 2016-11-02 DIAGNOSIS — Z955 Presence of coronary angioplasty implant and graft: Secondary | ICD-10-CM

## 2016-11-03 ENCOUNTER — Encounter: Payer: Self-pay | Admitting: Internal Medicine

## 2016-11-04 ENCOUNTER — Encounter (HOSPITAL_COMMUNITY): Payer: Federal, State, Local not specified - PPO

## 2016-11-04 ENCOUNTER — Encounter (HOSPITAL_COMMUNITY)
Admission: RE | Admit: 2016-11-04 | Discharge: 2016-11-04 | Disposition: A | Discharge status: Home / Self Care | Payer: Federal, State, Local not specified - PPO | Source: Ambulatory Visit | Attending: Internal Medicine | Admitting: Internal Medicine

## 2016-11-04 DIAGNOSIS — Z955 Presence of coronary angioplasty implant and graft: Secondary | ICD-10-CM | POA: Diagnosis not present

## 2016-11-07 ENCOUNTER — Encounter (HOSPITAL_COMMUNITY): Payer: Federal, State, Local not specified - PPO

## 2016-11-07 ENCOUNTER — Encounter (HOSPITAL_COMMUNITY)
Admission: RE | Admit: 2016-11-07 | Discharge: 2016-11-07 | Disposition: A | Discharge status: Home / Self Care | Payer: Federal, State, Local not specified - PPO | Source: Ambulatory Visit | Attending: Internal Medicine | Admitting: Internal Medicine

## 2016-11-07 DIAGNOSIS — Z955 Presence of coronary angioplasty implant and graft: Secondary | ICD-10-CM

## 2016-11-09 ENCOUNTER — Encounter (HOSPITAL_COMMUNITY): Payer: Federal, State, Local not specified - PPO

## 2016-11-09 ENCOUNTER — Encounter (HOSPITAL_COMMUNITY)
Admission: RE | Admit: 2016-11-09 | Discharge: 2016-11-09 | Disposition: A | Discharge status: Home / Self Care | Payer: Federal, State, Local not specified - PPO | Source: Ambulatory Visit | Attending: Internal Medicine | Admitting: Internal Medicine

## 2016-11-09 DIAGNOSIS — Z955 Presence of coronary angioplasty implant and graft: Secondary | ICD-10-CM | POA: Diagnosis not present

## 2016-11-11 ENCOUNTER — Encounter (HOSPITAL_COMMUNITY)
Admission: RE | Admit: 2016-11-11 | Discharge: 2016-11-11 | Disposition: A | Discharge status: Home / Self Care | Payer: Federal, State, Local not specified - PPO | Source: Ambulatory Visit | Attending: Internal Medicine | Admitting: Internal Medicine

## 2016-11-11 ENCOUNTER — Encounter (HOSPITAL_COMMUNITY): Payer: Federal, State, Local not specified - PPO

## 2016-11-11 DIAGNOSIS — Z955 Presence of coronary angioplasty implant and graft: Secondary | ICD-10-CM

## 2016-11-14 ENCOUNTER — Encounter (HOSPITAL_COMMUNITY): Payer: Federal, State, Local not specified - PPO

## 2016-11-14 ENCOUNTER — Ambulatory Visit: Payer: Federal, State, Local not specified - PPO | Admitting: Internal Medicine

## 2016-11-14 ENCOUNTER — Encounter (HOSPITAL_COMMUNITY)
Admission: RE | Admit: 2016-11-14 | Discharge: 2016-11-14 | Disposition: A | Discharge status: Home / Self Care | Payer: Federal, State, Local not specified - PPO | Source: Ambulatory Visit | Attending: Internal Medicine | Admitting: Internal Medicine

## 2016-11-14 DIAGNOSIS — Z955 Presence of coronary angioplasty implant and graft: Secondary | ICD-10-CM

## 2016-11-16 ENCOUNTER — Encounter (HOSPITAL_COMMUNITY)
Admission: RE | Admit: 2016-11-16 | Discharge: 2016-11-16 | Disposition: A | Discharge status: Home / Self Care | Payer: Federal, State, Local not specified - PPO | Source: Ambulatory Visit | Attending: Internal Medicine | Admitting: Internal Medicine

## 2016-11-16 ENCOUNTER — Encounter (HOSPITAL_COMMUNITY): Payer: Federal, State, Local not specified - PPO

## 2016-11-16 DIAGNOSIS — Z955 Presence of coronary angioplasty implant and graft: Secondary | ICD-10-CM

## 2016-11-18 ENCOUNTER — Ambulatory Visit (INDEPENDENT_AMBULATORY_CARE_PROVIDER_SITE_OTHER): Payer: Federal, State, Local not specified - PPO | Admitting: Internal Medicine

## 2016-11-18 ENCOUNTER — Encounter: Payer: Self-pay | Admitting: Internal Medicine

## 2016-11-18 ENCOUNTER — Ambulatory Visit: Payer: Federal, State, Local not specified - PPO | Admitting: Internal Medicine

## 2016-11-18 ENCOUNTER — Encounter (HOSPITAL_COMMUNITY): Payer: Federal, State, Local not specified - PPO

## 2016-11-18 ENCOUNTER — Encounter (INDEPENDENT_AMBULATORY_CARE_PROVIDER_SITE_OTHER): Payer: Self-pay

## 2016-11-18 ENCOUNTER — Encounter (HOSPITAL_COMMUNITY)
Admission: RE | Admit: 2016-11-18 | Discharge: 2016-11-18 | Disposition: A | Discharge status: Home / Self Care | Payer: Federal, State, Local not specified - PPO | Source: Ambulatory Visit | Attending: Internal Medicine | Admitting: Internal Medicine

## 2016-11-18 VITALS — BP 106/50 | HR 68 | Resp 98 | Ht 63.0 in | Wt 123.8 lb

## 2016-11-18 DIAGNOSIS — I255 Ischemic cardiomyopathy: Secondary | ICD-10-CM

## 2016-11-18 DIAGNOSIS — I25118 Atherosclerotic heart disease of native coronary artery with other forms of angina pectoris: Secondary | ICD-10-CM | POA: Diagnosis not present

## 2016-11-18 DIAGNOSIS — Z955 Presence of coronary angioplasty implant and graft: Secondary | ICD-10-CM

## 2016-11-18 DIAGNOSIS — I5022 Chronic systolic (congestive) heart failure: Secondary | ICD-10-CM | POA: Diagnosis not present

## 2016-11-18 MED ORDER — RANOLAZINE ER 500 MG PO TB12: 500.0000 mg | ORAL_TABLET | Freq: Two times a day (BID) | ORAL | 6 refills | Status: DC

## 2016-11-18 NOTE — Progress Notes (Signed)
Follow-up Outpatient Visit Date: 11/18/2016  Primary Care Provider: Libby Maw, MD [address] [place] [address]  Chief Complaint: Chest pain, shortness of breath, and dizziness  HPI:  [name] is a 66 y.o. year-old female with history of coronary artery disease, ischemic cardiomyopathy, hyperlipidemia, and prior tobacco use, who presents for follow-up after NSTEMI in 07/2016 and subsequent PCI to the LCx in 08/2016. The patient initially presented after a mechanical fall during which she fell forward while taking out her trash, striking her face and for head on her driveway. She had difficulty getting up and was ultimately brought to the emergency department by EMS. EKG demonstrated subtle anterolateral ST elevations, though the patient was chest pain-free. Cardiac catheterization was initially deferred but subsequently performed due to mild troponin elevation, which peaked at 0.27. Catheterization revealed chronic total occlusion of the mid LAD as well as high-grade disease of the proximal LCx. Cardiac MRI revealed anterior and apical scar without viable myocardium and an LVEF of 38%. Due to her lack of cardiac symptoms and recent fall with multiple facial abrasions and lacerations, the decision was made to defer intervention to the LCx. Staged PCI to the LCx with orbital atherectomy was subsequently performed on 08/29/16.  I last saw [name] on 10/21/16 after 2 episodes of exertional chest pain and cardiac rehabilitation. Her carvedilol had been increased to 6.25 mg twice a day at that time by one of our EP piece. She notes that since then, she has felt better. She still has some chest tightness when she pushes herself at cardiac rehab but no significant pain like in the past. She has mild exertional dyspnea but is able to complete her activities at home and at cardiac rehab without difficulty. She notices that her heart races when she pushes herself too much but otherwise denies  palpitations. Her blood pressures tend to fluctuate at cardiac rehab and were somewhat low at the Pam [name] of today's session (98/52) with mild lightheadedness. The symptoms resolved after drinking water. [name] remains compliant with her medications, including dual antiplatelet therapy with aspirin and clopidogrel. She has not had any significant bleeding.  --------------------------------------------------------------------------------------------------  Cardiovascular History & Procedures: Cardiovascular Problems:  Coronary artery disease with NSTEMI and chronic total occlusion of the proximal/mid LAD  Ischemic cardiomyopathy  Risk Factors:  Known coronary artery disease, hyperlipidemia, tobacco use, age  Cath/PCI:  PCI (08/29/16): Significant left coronary artery disease, including CTO of the mid LAD and 80% calcified proximal LCx stenosis extending into large OM1 branch. Successful IVUS-guided PCI utilizing orbital atherectomy with placement of a Synergy 3.0 x 32 mm drug-eluting stent. Normal left ventricular filling pressure. Significant right radial artery spasm precluded guide catheter advancement. Therefore, femoral access was used for the intervention.  LHC (07/08/16): Significant two-vessel coronary artery disease including totally calcified chronic total occlusion of the proximal/mid LAD and calcified 80% stenosis of proximal LCx/OM one. LVEF 30% with anterior, apical, and apical inferior disc low normal LVEDP (4 mmHg).  CV Surgery:  None  EP Procedures and Devices:  None  Non-Invasive Evaluation(s):  Right groin ultrasound (09/16/16): Patent arteries and veins without pseudoaneurysm, AV fistula, or obstruction.  Cardiac MRI (07/11/16): Normal LV size and wall thickness with moderately reduced contraction (EF 38%). Akinesis/dyskinesis of the mid and apical anterior/anteroseptal and apical segments. LAD territory is nonviable. RCA and LCx territories are viable. Normal RV size  and function. Normal biatrial size.  Transthoracic echocardiogram (07/08/16): Normal LV size and wall thickness with  LVEF of 35-40%. Anterior and apical wall motion abnormality present. Grade 1 diastolic dysfunction. Normal RV size and function. No significant valvular abnormalities.  Recent CV Pertinent Labs: Lab Results  Component Value Date   CHOL 174 07/07/2016   HDL 87 07/07/2016   LDLCALC 70 07/07/2016   TRIG 87 07/07/2016   CHOLHDL 2.0 07/07/2016   INR 1.1 08/25/2016   K 3.8 08/30/2016   BUN 10 08/30/2016   BUN 12 08/25/2016   CREATININE 0.67 08/30/2016    Past medical and surgical history were reviewed and updated in EPIC.  Outpatient Encounter Prescriptions as of 11/18/2016  Medication Sig  . acetaminophen (TYLENOL) 650 MG CR tablet Take 650 mg by mouth every 8 (eight) hours as needed for pain (headache).  Marland Kitchen aspirin 81 MG chewable tablet Chew 1 tablet (81 mg total) by mouth daily.  Marland Kitchen atorvastatin (LIPITOR) 40 MG tablet Take 1 tablet (40 mg total) by mouth daily.  . carvedilol (COREG) 6.25 MG tablet Take 1 tablet (6.25 mg total) by mouth 2 (two) times daily.  . clopidogrel (PLAVIX) 75 MG tablet Take 1 tablet (75 mg total) by mouth daily with breakfast.  . levothyroxine (SYNTHROID, LEVOTHROID) 25 MCG tablet Take 37.5 mcg by mouth daily before breakfast.  . lisinopril (PRINIVIL,ZESTRIL) 5 MG tablet Take 1 tablet (5 mg total) by mouth daily.  . nitroGLYCERIN (NITROSTAT) 0.4 MG SL tablet Place 1 tablet (0.4 mg total) under the tongue every 5 (five) minutes as needed for chest pain.   No facility-administered encounter medications on file as of 11/18/2016.     Allergies: Patient has no known allergies.  Social History   Social History  . Marital status: Married    Spouse name: N/A  . Number of children: 2  . Years of education: N/A   Occupational History  . Retired-worked for [employer]    Social History Main Topics  . Smoking status: Former Smoker    Packs/day:  1.00    Years: 50.00    Types: Cigarettes    Quit date: 07/07/2016  . Smokeless tobacco: Never Used  . Alcohol use No  . Drug use: No  . Sexual activity: Not on file   Other Topics Concern  . Not on file   Social History Narrative  . No narrative on file    Family History  Problem Relation Age of Onset  . Kidney failure Mother     Review of Systems: Right groin soreness and bruising have resolved. A 12-system review of systems was performed and was negative except as noted in the HPI.  --------------------------------------------------------------------------------------------------  Physical Exam: BP (!) 106/50   Pulse 68   Resp (!) 98   Ht 5\' 3"  (1.6 m)   Wt 123 lb 12.8 oz (56.2 kg)   BMI 21.93 kg/m   General:  Slender woman seated comfortably in the exam room. HEENT: No conjunctival pallor or scleral icterus.  Moist mucous membranes.  OP clear. Bruising under the right eye is present, improved from prior visit last month. Neck: Supple without lymphadenopathy, thyromegaly, JVD, or HJR. Lungs: Normal work of breathing.  Clear to auscultation bilaterally without wheezes or crackles. Heart: Regular rate and rhythm without murmurs, rubs, or gallops.  Non-displaced PMI. Abd: Bowel sounds present.  Soft, NT/ND without hepatosplenomegaly Ext: No lower extremity edema.  Radial, PT, and DP pulses are 2+ bilaterally.  Skin: warm and dry.  Lab Results  Component Value Date   WBC 7.3 08/30/2016   HGB 11.5 (L)  08/30/2016   HCT 33.8 (L) 08/30/2016   MCV 87.8 08/30/2016   PLT 219 08/30/2016    Lab Results  Component Value Date   NA 134 (L) 08/30/2016   K 3.8 08/30/2016   CL 102 08/30/2016   CO2 26 08/30/2016   BUN 10 08/30/2016   CREATININE 0.67 08/30/2016   GLUCOSE 109 (H) 08/30/2016   ALT 27 07/07/2016    Lab Results  Component Value Date   CHOL 174 07/07/2016   HDL 87 07/07/2016   LDLCALC 70 07/07/2016   TRIG 87 07/07/2016   CHOLHDL 2.0 07/07/2016     --------------------------------------------------------------------------------------------------  ASSESSMENT AND PLAN: Coronary artery disease with stable angina [name] reports feeling well today, though she continues to have some exertional chest pressure when pushing herself at cardiac rehab. This could represent periinfarct ischemia in the LAD territory or small vessel disease involving the LCx/RCA territories. I have reviewed cath images from 07/2016 and 08/2016; given LAD CTO and infarct (non-viable by MRI) and PCI to mid LCx disease in 08/2016, our interventional targets are limited. We have agreed to add ranolazine 500 mg BID. I am hesitant to escalate metoprolol further at this point due to a resting heart rate in the low 60's and intermittently low blood pressure. I have encouraged [name] to continue cardiac rehab. She will remain on DAPT with aspirin and clopidogrel though at least 07/2017.  Ischemic cardiomyopathy with chronic systolic heart failure [name] appears euvolemic and well-compensated with NYHA class II symptoms. We will continue carvedilol 6.25 mg BID and lisinopril 5 mg daily. Given intermittently borderline low blood pressures with some lightheadedness, we will defer escalation today.  Follow-up: Return to clinic in 3 months.  [name], MD 11/18/2016 8:05 PM

## 2016-11-18 NOTE — Patient Instructions (Signed)
Medication Instructions:  Start Ranexa 500mg  every 12 hours  Labwork: None   Testing/Procedures: None   Follow-Up: Your physician recommends that you schedule a follow-up appointment in: 3 months with Dr End.        If you need a refill on your cardiac medications before your next appointment, please call your pharmacy.

## 2016-11-21 ENCOUNTER — Encounter (HOSPITAL_COMMUNITY): Payer: Federal, State, Local not specified - PPO

## 2016-11-21 ENCOUNTER — Encounter (HOSPITAL_COMMUNITY)
Admission: RE | Admit: 2016-11-21 | Discharge: 2016-11-21 | Disposition: A | Discharge status: Home / Self Care | Payer: Federal, State, Local not specified - PPO | Source: Ambulatory Visit | Attending: Internal Medicine | Admitting: Internal Medicine

## 2016-11-21 DIAGNOSIS — Z955 Presence of coronary angioplasty implant and graft: Secondary | ICD-10-CM | POA: Diagnosis not present

## 2016-11-23 ENCOUNTER — Encounter (HOSPITAL_COMMUNITY): Payer: Federal, State, Local not specified - PPO

## 2016-11-23 ENCOUNTER — Encounter (HOSPITAL_COMMUNITY)
Admission: RE | Admit: 2016-11-23 | Discharge: 2016-11-23 | Disposition: A | Discharge status: Home / Self Care | Payer: Federal, State, Local not specified - PPO | Source: Ambulatory Visit | Attending: Internal Medicine | Admitting: Internal Medicine

## 2016-11-23 DIAGNOSIS — Z955 Presence of coronary angioplasty implant and graft: Secondary | ICD-10-CM

## 2016-11-23 NOTE — Progress Notes (Signed)
Cardiac Individual Treatment Plan  Patient Details  Name: Pam [name] MRN: [id number] Date of Birth: [birth year] Referring Provider:     CARDIAC REHAB PHASE II ORIENTATION from 09/27/2016 in [place]  Referring Provider  End, Harrell Gave MD      Initial Encounter Date:    CARDIAC REHAB PHASE II ORIENTATION from 09/27/2016 in [facility]  Date  09/27/16  Referring Provider  End, Harrell Gave MD      Visit Diagnosis: 08/29/16 Status post coronary artery stent placement  Patient's Home Medications on Admission:  Current Outpatient Prescriptions:  .  acetaminophen (TYLENOL) 650 MG CR tablet, Take 650 mg by mouth every 8 (eight) hours as needed for pain (headache)., Disp: , Rfl:  .  aspirin 81 MG chewable tablet, Chew 1 tablet (81 mg total) by mouth daily., Disp: 30 tablet, Rfl: 0 .  atorvastatin (LIPITOR) 40 MG tablet, Take 1 tablet (40 mg total) by mouth daily., Disp: 30 tablet, Rfl: 0 .  carvedilol (COREG) 6.25 MG tablet, Take 1 tablet (6.25 mg total) by mouth 2 (two) times daily., Disp: 180 tablet, Rfl: 1 .  clopidogrel (PLAVIX) 75 MG tablet, Take 1 tablet (75 mg total) by mouth daily with breakfast., Disp: 30 tablet, Rfl: 11 .  levothyroxine (SYNTHROID, LEVOTHROID) 25 MCG tablet, Take 37.5 mcg by mouth daily before breakfast., Disp: , Rfl:  .  lisinopril (PRINIVIL,ZESTRIL) 5 MG tablet, Take 1 tablet (5 mg total) by mouth daily., Disp: 90 tablet, Rfl: 1 .  nitroGLYCERIN (NITROSTAT) 0.4 MG SL tablet, Place 1 tablet (0.4 mg total) under the tongue every 5 (five) minutes as needed for chest pain., Disp: 90 tablet, Rfl: 3 .  ranolazine (RANEXA) 500 MG 12 hr tablet, Take 1 tablet (500 mg total) by mouth 2 (two) times daily., Disp: 60 tablet, Rfl: 6  Past Medical History: Past Medical History:  Diagnosis Date  . Hyperlipidemia   . Thyroid disease     Tobacco Use: History  Smoking Status  . Former Smoker  . Packs/day:  1.00  . Years: 50.00  . Types: Cigarettes  . Quit date: 07/07/2016  Smokeless Tobacco  . Never Used    Labs: Recent Review Flowsheet Data    Labs for ITP Cardiac and Pulmonary Rehab Latest Ref Rng & Units 07/07/2016 07/09/2016   Cholestrol 0 - 200 mg/dL 174 -   LDLCALC 0 - 99 mg/dL 70 -   HDL >40 mg/dL 87 -   Trlycerides <150 mg/dL 87 -   Hemoglobin A1c 4.8 - 5.6 % - 5.3      Capillary Blood Glucose: No results found for: GLUCAP   Exercise Target Goals:    Exercise Program Goal: Individual exercise prescription set with THRR, safety & activity barriers. Participant demonstrates ability to understand and report RPE using BORG scale, to self-measure pulse accurately, and to acknowledge the importance of the exercise prescription.  Exercise Prescription Goal: Starting with aerobic activity 30 plus minutes a day, 3 days per week for initial exercise prescription. Provide home exercise prescription and guidelines that participant acknowledges understanding prior to discharge.  Activity Barriers & Risk Stratification:     Activity Barriers & Cardiac Risk Stratification - 09/27/16 1112      Activity Barriers & Cardiac Risk Stratification   Activity Barriers None   Cardiac Risk Stratification Moderate      6 Minute Walk:     6 Minute Walk    Row Name 09/27/16 1029  6 Minute Walk   Phase Initial     Distance 1178 feet     Walk Time 6 minutes     # of Rest Breaks 0     MPH 2.23     METS 3.01     RPE 13     VO2 Peak 10.53     Symptoms Yes (comment)     Comments Patient c/o dyspnea.     Resting HR 68 bpm     Resting BP 132/63     Max Ex. HR 98 bpm     Max Ex. BP 114/60     2 Minute Post BP 120/70        Oxygen Initial Assessment:   Oxygen Re-Evaluation:   Oxygen Discharge (Final Oxygen Re-Evaluation):   Initial Exercise Prescription:     Initial Exercise Prescription - 09/27/16 1000      Date of Initial Exercise RX and Referring Provider    Date 09/27/16   Referring Provider End, Harrell Gave MD     Bike   Level 0.5   Minutes 10   METs 2.72     Recumbant Bike   Level 2   RPM 20   Watts 10   Minutes 10   METs 1.4     NuStep   Level 2   SPM 70   Minutes 10   METs 2     Track   Laps 7   Minutes 10   METs 2.23     Prescription Details   Frequency (times per week) 3   Duration Progress to 30 minutes of continuous aerobic without signs/symptoms of physical distress     Intensity   THRR 40-80% of Max Heartrate 62-124   Ratings of Perceived Exertion 11-13   Perceived Dyspnea 0-4     Progression   Progression Continue to progress workloads to maintain intensity without signs/symptoms of physical distress.     Resistance Training   Training Prescription Yes   Weight 2lbs   Reps 10-15      Perform Capillary Blood Glucose checks as needed.  Exercise Prescription Changes:     Exercise Prescription Changes    Row Name 10/17/16 1600 11/17/16 1500           Response to Exercise   Blood Pressure (Admit) 122/76 130/70      Blood Pressure (Exercise) 108/78 154/74      Blood Pressure (Exit) 104/60 109/73      Heart Rate (Admit) 74 bpm 77 bpm      Heart Rate (Exercise) 126 bpm 110 bpm      Heart Rate (Exit) 78 bpm 75 bpm      Rating of Perceived Exertion (Exercise) 11 11      Duration Progress to 45 minutes of aerobic exercise without signs/symptoms of physical distress Progress to 45 minutes of aerobic exercise without signs/symptoms of physical distress      Intensity THRR unchanged THRR unchanged        Progression   Progression Continue to progress workloads to maintain intensity without signs/symptoms of physical distress. Continue to progress workloads to maintain intensity without signs/symptoms of physical distress.      Average METs 2.9 3.3        Resistance Training   Training Prescription Yes Yes      Weight 2lbs 3lb      Reps 10-15 10-15        Recumbant Bike   Level 2 2  RPM 20 20       Watts 10 10      Minutes 10 10      METs 2.6 2.6        NuStep   Level 4 4      SPM 70 70      Minutes 10 10      METs 3.9 4        Track   Laps 14 14      Minutes 10 10      METs 3.43 3.43        Home Exercise Plan   Plans to continue exercise at Home (comment) Home (comment)      Frequency Add 4 additional days to program exercise sessions. Add 4 additional days to program exercise sessions.      Initial Home Exercises Provided 10/12/16 10/12/16         Exercise Comments:     Exercise Comments    Row Name 10/24/16 1619 11/17/16 1527         Exercise Comments Reviewed METs and goals with pt.  Pt is doing well with exercise.  Reviewed METs and goals with pt.  Pt is doing well with exercise.          Exercise Goals and Review:     Exercise Goals    Row Name 09/27/16 0849             Exercise Goals   Increase Physical Activity Yes       Intervention Provide advice, education, support and counseling about physical activity/exercise needs.;Develop an individualized exercise prescription for aerobic and resistive training based on initial evaluation findings, risk stratification, comorbidities and participant's personal goals.       Expected Outcomes Achievement of increased cardiorespiratory fitness and enhanced flexibility, muscular endurance and strength shown through measurements of functional capacity and personal statement of participant.       Increase Strength and Stamina Yes       Intervention Provide advice, education, support and counseling about physical activity/exercise needs.;Develop an individualized exercise prescription for aerobic and resistive training based on initial evaluation findings, risk stratification, comorbidities and participant's personal goals.       Expected Outcomes Achievement of increased cardiorespiratory fitness and enhanced flexibility, muscular endurance and strength shown through measurements of functional capacity and  personal statement of participant.          Exercise Goals Re-Evaluation :     Exercise Goals Re-Evaluation    Row Name 10/24/16 1613 11/17/16 1527           Exercise Goal Re-Evaluation   Exercise Goals Review Increase Physical Activity;Increase Strenth and Stamina Increase Physical Activity;Increase Strenth and Stamina      Comments Pt states that she feels great and she is tolerating workload increases very well.  Unfortunately, due to some chest pain, Naijah has had to decrease some of her workloads and we will gradually reintroduce WL increases as tolerated. w/o signs/symptoms. Pt states that she feels great and she is tolerating workload increases very well.        Expected Outcomes Continue with exercise prescription in order to continue to build strength, increase stamina and overall cardiorespiratory fitness.   Continue with exercise prescription in order to continue to build strength, increase stamina and overall cardiorespiratory fitness.            Discharge Exercise Prescription (Final Exercise Prescription Changes):     Exercise Prescription Changes - 11/17/16 1500  Response to Exercise   Blood Pressure (Admit) 130/70   Blood Pressure (Exercise) 154/74   Blood Pressure (Exit) 109/73   Heart Rate (Admit) 77 bpm   Heart Rate (Exercise) 110 bpm   Heart Rate (Exit) 75 bpm   Rating of Perceived Exertion (Exercise) 11   Duration Progress to 45 minutes of aerobic exercise without signs/symptoms of physical distress   Intensity THRR unchanged     Progression   Progression Continue to progress workloads to maintain intensity without signs/symptoms of physical distress.   Average METs 3.3     Resistance Training   Training Prescription Yes   Weight 3lb   Reps 10-15     Recumbant Bike   Level 2   RPM 20   Watts 10   Minutes 10   METs 2.6     NuStep   Level 4   SPM 70   Minutes 10   METs 4     Track   Laps 14   Minutes 10   METs 3.43     Home  Exercise Plan   Plans to continue exercise at Home (comment)   Frequency Add 4 additional days to program exercise sessions.   Initial Home Exercises Provided 10/12/16      Nutrition:  Target Goals: Understanding of nutrition guidelines, daily intake of sodium 1500mg , cholesterol 200mg , calories 30% from fat and 7% or less from saturated fats, daily to have 5 or more servings of fruits and vegetables.  Biometrics:     Pre Biometrics - 09/27/16 0841      Pre Biometrics   Waist Circumference 28.5 inches   Hip Circumference 38 inches   Waist to Hip Ratio 0.75 %   Triceps Skinfold 24 mm   % Body Fat 33 %   Grip Strength 22 kg   Flexibility 13.25 in   Single Leg Stand 30 seconds       Nutrition Therapy Plan and Nutrition Goals:     Nutrition Therapy & Goals - 10/17/16 1041      Nutrition Therapy   Diet Therapeutic Lifestyle Changes     Personal Nutrition Goals   Nutrition Goal Pt to maintain her wt while in Cardiac Rehab   Personal Goal #2 Pt to identify and limit foods high in sodium     Intervention Plan   Intervention Prescribe, educate and counsel regarding individualized specific dietary modifications aiming towards targeted core components such as weight, hypertension, lipid management, diabetes, heart failure and other comorbidities.;Nutrition handout(s) given to patient.  Heart Failure Nutrition Therapy   Expected Outcomes Short Term Goal: Understand basic principles of dietary content, such as calories, fat, sodium, cholesterol and nutrients.;Long Term Goal: Adherence to prescribed nutrition plan.      Nutrition Discharge: Nutrition Scores:     Nutrition Assessments - 10/17/16 1040      Rate Your Plate Scores   Pre Score 31      Nutrition Goals Re-Evaluation:   Nutrition Goals Re-Evaluation:   Nutrition Goals Discharge (Final Nutrition Goals Re-Evaluation):   Psychosocial: Target Goals: Acknowledge presence or absence of significant depression  and/or stress, maximize coping skills, provide positive support system. Participant is able to verbalize types and ability to use techniques and skills needed for reducing stress and depression.  Initial Review & Psychosocial Screening:     Initial Psych Review & Screening - 10/24/16 1630      Screening Interventions   Interventions Encouraged to exercise      Quality of Life  Scores:     Quality of Life - 09/27/16 1007      Quality of Life Scores   Health/Function Pre 23.1 %   Socioeconomic Pre 26 %   Psych/Spiritual Pre 24 %   Family Pre 26.4 %   GLOBAL Pre 24.32 %      PHQ-9: Recent Review Flowsheet Data    There is no flowsheet data to display.     Interpretation of Total Score  Total Score Depression Severity:  1-4 = Minimal depression, 5-9 = Mild depression, 10-14 = Moderate depression, 15-19 = Moderately severe depression, 20-27 = Severe depression   Psychosocial Evaluation and Intervention:   Psychosocial Re-Evaluation:     Psychosocial Re-Evaluation    Row Name 10/25/16 1319 11/23/16 1742           Psychosocial Re-Evaluation   Current issues with None Identified None Identified      Interventions Stress management education Encouraged to attend Cardiac Rehabilitation for the exercise      Continue Psychosocial Services  No Follow up required No Follow up required         Psychosocial Discharge (Final Psychosocial Re-Evaluation):     Psychosocial Re-Evaluation - 11/23/16 1742      Psychosocial Re-Evaluation   Current issues with None Identified   Interventions Encouraged to attend Cardiac Rehabilitation for the exercise   Continue Psychosocial Services  No Follow up required      Vocational Rehabilitation: Provide vocational rehab assistance to qualifying candidates.   Vocational Rehab Evaluation & Intervention:     Vocational Rehab - 10/24/16 1623      Initial Vocational Rehab Evaluation & Intervention   Assessment shows need for  Vocational Rehabilitation --      Education: Education Goals: Education classes will be provided on a weekly basis, covering required topics. Participant will state understanding/return demonstration of topics presented.  Learning Barriers/Preferences:     Learning Barriers/Preferences - 09/27/16 0848      Learning Barriers/Preferences   Learning Barriers Sight   Learning Preferences Written Material;Video;Skilled Demonstration      Education Topics: Count Your Pulse:  -Group instruction provided by verbal instruction, demonstration, patient participation and written materials to support subject.  Instructors address importance of being able to find your pulse and how to count your pulse when at home without a heart monitor.  Patients get hands on experience counting their pulse with staff help and individually.   Heart Attack, Angina, and Risk Factor Modification:  -Group instruction provided by verbal instruction, video, and written materials to support subject.  Instructors address signs and symptoms of angina and heart attacks.    Also discuss risk factors for heart disease and how to make changes to improve heart health risk factors.   CARDIAC REHAB PHASE II EXERCISE from 11/18/2016 in [place]  Date  10/26/16  Instruction Review Code  2- meets goals/outcomes      Functional Fitness:  -Group instruction provided by verbal instruction, demonstration, patient participation, and written materials to support subject.  Instructors address safety measures for doing things around the house.  Discuss how to get up and down off the floor, how to pick things up properly, how to safely get out of a chair without assistance, and balance training.   Meditation and Mindfulness:  -Group instruction provided by verbal instruction, patient participation, and written materials to support subject.  Instructor addresses importance of mindfulness and meditation  practice to help reduce stress and improve awareness.  Instructor also leads participants through a meditation exercise.    Stretching for Flexibility and Mobility:  -Group instruction provided by verbal instruction, patient participation, and written materials to support subject.  Instructors lead participants through series of stretches that are designed to increase flexibility thus improving mobility.  These stretches are additional exercise for major muscle groups that are typically performed during regular warm up and cool down.   Hands Only CPR Anytime:  -Group instruction provided by verbal instruction, video, patient participation and written materials to support subject.  Instructors co-teach with AHA video for hands only CPR.  Participants get hands on experience with mannequins.   Nutrition I class: Heart Healthy Eating:  -Group instruction provided by PowerPoint slides, verbal discussion, and written materials to support subject matter. The instructor gives an explanation and review of the Therapeutic Lifestyle Changes diet recommendations, which includes a discussion on lipid goals, dietary fat, sodium, fiber, plant stanol/sterol esters, sugar, and the components of a well-balanced, healthy diet.   CARDIAC REHAB PHASE II EXERCISE from 11/18/2016 in [facility]  Date  10/04/16  Educator  RD  Instruction Review Code  2- meets goals/outcomes      Nutrition II class: Lifestyle Skills:  -Group instruction provided by PowerPoint slides, verbal discussion, and written materials to support subject matter. The instructor gives an explanation and review of label reading, grocery shopping for heart health, heart healthy recipe modifications, and ways to make healthier choices when eating out.   Diabetes Question & Answer:  -Group instruction provided by PowerPoint slides, verbal discussion, and written materials to support subject matter. The instructor gives  an explanation and review of diabetes co-morbidities, pre- and post-prandial blood glucose goals, pre-exercise blood glucose goals, signs, symptoms, and treatment of hypoglycemia and hyperglycemia, and foot care basics.   Diabetes Blitz:  -Group instruction provided by PowerPoint slides, verbal discussion, and written materials to support subject matter. The instructor gives an explanation and review of the physiology behind type 1 and type 2 diabetes, diabetes medications and rational behind using different medications, pre- and post-prandial blood glucose recommendations and Hemoglobin A1c goals, diabetes diet, and exercise including blood glucose guidelines for exercising safely.    Portion Distortion:  -Group instruction provided by PowerPoint slides, verbal discussion, written materials, and food models to support subject matter. The instructor gives an explanation of serving size versus portion size, changes in portions sizes over the last 20 years, and what consists of a serving from each food group.   Stress Management:  -Group instruction provided by verbal instruction, video, and written materials to support subject matter.  Instructors review role of stress in heart disease and how to cope with stress positively.     CARDIAC REHAB PHASE II EXERCISE from 11/18/2016 in [place]  Date  10/05/16  Instruction Review Code  2- meets goals/outcomes      Exercising on Your Own:  -Group instruction provided by verbal instruction, power point, and written materials to support subject.  Instructors discuss benefits of exercise, components of exercise, frequency and intensity of exercise, and end points for exercise.  Also discuss use of nitroglycerin and activating EMS.  Review options of places to exercise outside of rehab.  Review guidelines for sex with heart disease.   Cardiac Drugs I:  -Group instruction provided by verbal instruction and written materials  to support subject.  Instructor reviews cardiac drug classes: antiplatelets, anticoagulants, beta blockers, and statins.  Instructor discusses reasons,  side effects, and lifestyle considerations for each drug class.   Cardiac Drugs II:  -Group instruction provided by verbal instruction and written materials to support subject.  Instructor reviews cardiac drug classes: angiotensin converting enzyme inhibitors (ACE-I), angiotensin II receptor blockers (ARBs), nitrates, and calcium channel blockers.  Instructor discusses reasons, side effects, and lifestyle considerations for each drug class.   CARDIAC REHAB PHASE II EXERCISE from 11/18/2016 in [place]  Date  10/12/16  Educator  pharm  Instruction Review Code  2- meets goals/outcomes      Anatomy and Physiology of the Circulatory System:  -Group instruction provided by verbal instruction, video, and written materials to support subject.  Reviews functional anatomy of heart, how it relates to various diagnoses, and what role the heart plays in the overall system.   CARDIAC REHAB PHASE II EXERCISE from 11/18/2016 in [place] [facility]  Date  11/02/16  Instruction Review Code  2- meets goals/outcomes      Knowledge Questionnaire Score:     Knowledge Questionnaire Score - 09/27/16 1007      Knowledge Questionnaire Score   Pre Score 20/24      Core Components/Risk Factors/Patient Goals at Admission:     Personal Goals and Risk Factors at Admission - 09/27/16 1027      Core Components/Risk Factors/Patient Goals on Admission   Improve shortness of breath with ADL's Yes   Intervention Provide education, individualized exercise plan and daily activity instruction to help decrease symptoms of SOB with activities of daily living.   Expected Outcomes Short Term: Achieves a reduction of symptoms when performing activities of daily living.   Heart Failure Yes   Intervention Provide a  combined exercise and nutrition program that is supplemented with education, support and counseling about heart failure. Directed toward relieving symptoms such as shortness of breath, decreased exercise tolerance, and extremity edema.   Expected Outcomes Improve functional capacity of life;Short term: Attendance in program 2-3 days a week with increased exercise capacity. Reported lower sodium intake. Reported increased fruit and vegetable intake. Reports medication compliance.;Short term: Daily weights obtained and reported for increase. Utilizing diuretic protocols set by physician.;Long term: Adoption of self-care skills and reduction of barriers for early signs and symptoms recognition and intervention leading to self-care maintenance.   Lipids Yes   Intervention Provide education and support for participant on nutrition & aerobic/resistive exercise along with prescribed medications to achieve LDL 70mg , HDL >40mg .   Expected Outcomes Short Term: Participant states understanding of desired cholesterol values and is compliant with medications prescribed. Participant is following exercise prescription and nutrition guidelines.;Long Term: Cholesterol controlled with medications as prescribed, with individualized exercise RX and with personalized nutrition plan. Value goals: LDL < 70mg , HDL > 40 mg.   Stress Yes   Intervention Offer individual and/or small group education and counseling on adjustment to heart disease, stress management and health-related lifestyle change. Teach and support self-help strategies.;Refer participants experiencing significant psychosocial distress to appropriate mental health specialists for further evaluation and treatment. When possible, include family members and significant others in education/counseling sessions.  medical stress   Expected Outcomes Short Term: Participant demonstrates changes in health-related behavior, relaxation and other stress management skills, ability  to obtain effective social support, and compliance with psychotropic medications if prescribed.;Long Term: Emotional wellbeing is indicated by absence of clinically significant psychosocial distress or social isolation.      Core Components/Risk Factors/Patient Goals Review:    Core Components/Risk Factors/Patient Goals  at Discharge (Final Review):    ITP Comments:     ITP Comments    Row Name 09/27/16 0840           ITP Comments Dr. Fransico Him, Medical Director          Comments: Marti is making expected progress toward personal goals after completing 22 sessions. Recommend continued exercise and life style modification education including  stress management and relaxation techniques to decrease cardiac risk profile. Dessire has not complained of any further angina with exercise. Will continue to monitor. No complaints voiced since Dr End added Ranexa.Will continue to monitor the patient throughout  the program.Rolfe Hartsell Venetia Maxon, RN,BSN 11/23/2016 5:46 PM

## 2016-11-25 ENCOUNTER — Encounter (HOSPITAL_COMMUNITY): Payer: Federal, State, Local not specified - PPO

## 2016-11-25 ENCOUNTER — Encounter (HOSPITAL_COMMUNITY)
Admission: RE | Admit: 2016-11-25 | Discharge: 2016-11-25 | Disposition: A | Discharge status: Home / Self Care | Payer: Federal, State, Local not specified - PPO | Source: Ambulatory Visit | Attending: Internal Medicine | Admitting: Internal Medicine

## 2016-11-25 DIAGNOSIS — Z955 Presence of coronary angioplasty implant and graft: Secondary | ICD-10-CM

## 2016-11-28 ENCOUNTER — Encounter (HOSPITAL_COMMUNITY): Payer: Federal, State, Local not specified - PPO

## 2016-11-28 ENCOUNTER — Encounter (HOSPITAL_COMMUNITY)
Admission: RE | Admit: 2016-11-28 | Discharge: 2016-11-28 | Disposition: A | Discharge status: Home / Self Care | Payer: Federal, State, Local not specified - PPO | Source: Ambulatory Visit | Attending: Internal Medicine | Admitting: Internal Medicine

## 2016-11-28 DIAGNOSIS — Z955 Presence of coronary angioplasty implant and graft: Secondary | ICD-10-CM | POA: Diagnosis not present

## 2016-11-30 ENCOUNTER — Encounter (HOSPITAL_COMMUNITY): Payer: Federal, State, Local not specified - PPO

## 2016-11-30 ENCOUNTER — Encounter (HOSPITAL_COMMUNITY)
Admission: RE | Admit: 2016-11-30 | Discharge: 2016-11-30 | Disposition: A | Discharge status: Home / Self Care | Payer: Federal, State, Local not specified - PPO | Source: Ambulatory Visit | Attending: Internal Medicine | Admitting: Internal Medicine

## 2016-11-30 DIAGNOSIS — Z7982 Long term (current) use of aspirin: Secondary | ICD-10-CM | POA: Diagnosis not present

## 2016-11-30 DIAGNOSIS — Z7902 Long term (current) use of antithrombotics/antiplatelets: Secondary | ICD-10-CM | POA: Insufficient documentation

## 2016-11-30 DIAGNOSIS — Z955 Presence of coronary angioplasty implant and graft: Secondary | ICD-10-CM | POA: Insufficient documentation

## 2016-11-30 DIAGNOSIS — Z79899 Other long term (current) drug therapy: Secondary | ICD-10-CM | POA: Diagnosis not present

## 2016-11-30 DIAGNOSIS — Z87891 Personal history of nicotine dependence: Secondary | ICD-10-CM | POA: Insufficient documentation

## 2016-12-02 ENCOUNTER — Encounter (HOSPITAL_COMMUNITY): Payer: Federal, State, Local not specified - PPO

## 2016-12-02 ENCOUNTER — Encounter (HOSPITAL_COMMUNITY)
Admission: RE | Admit: 2016-12-02 | Discharge: 2016-12-02 | Disposition: A | Discharge status: Home / Self Care | Payer: Federal, State, Local not specified - PPO | Source: Ambulatory Visit | Attending: Internal Medicine | Admitting: Internal Medicine

## 2016-12-02 DIAGNOSIS — Z955 Presence of coronary angioplasty implant and graft: Secondary | ICD-10-CM | POA: Diagnosis not present

## 2016-12-05 ENCOUNTER — Encounter (HOSPITAL_COMMUNITY)
Admission: RE | Admit: 2016-12-05 | Discharge: 2016-12-05 | Disposition: A | Discharge status: Home / Self Care | Payer: Federal, State, Local not specified - PPO | Source: Ambulatory Visit | Attending: Internal Medicine | Admitting: Internal Medicine

## 2016-12-05 ENCOUNTER — Encounter (HOSPITAL_COMMUNITY): Payer: Federal, State, Local not specified - PPO

## 2016-12-05 DIAGNOSIS — Z955 Presence of coronary angioplasty implant and graft: Secondary | ICD-10-CM | POA: Diagnosis not present

## 2016-12-07 ENCOUNTER — Encounter (HOSPITAL_COMMUNITY): Payer: Federal, State, Local not specified - PPO

## 2016-12-07 ENCOUNTER — Encounter (HOSPITAL_COMMUNITY)
Admission: RE | Admit: 2016-12-07 | Discharge: 2016-12-07 | Disposition: A | Discharge status: Home / Self Care | Payer: Federal, State, Local not specified - PPO | Source: Ambulatory Visit | Attending: Internal Medicine | Admitting: Internal Medicine

## 2016-12-07 DIAGNOSIS — Z955 Presence of coronary angioplasty implant and graft: Secondary | ICD-10-CM | POA: Diagnosis not present

## 2016-12-09 ENCOUNTER — Encounter (HOSPITAL_COMMUNITY)
Admission: RE | Admit: 2016-12-09 | Discharge: 2016-12-09 | Disposition: A | Discharge status: Home / Self Care | Payer: Federal, State, Local not specified - PPO | Source: Ambulatory Visit | Attending: Internal Medicine | Admitting: Internal Medicine

## 2016-12-09 ENCOUNTER — Encounter (HOSPITAL_COMMUNITY): Payer: Federal, State, Local not specified - PPO

## 2016-12-09 DIAGNOSIS — Z955 Presence of coronary angioplasty implant and graft: Secondary | ICD-10-CM

## 2016-12-12 ENCOUNTER — Encounter (HOSPITAL_COMMUNITY)
Admission: RE | Admit: 2016-12-12 | Discharge: 2016-12-12 | Disposition: A | Discharge status: Home / Self Care | Payer: Federal, State, Local not specified - PPO | Source: Ambulatory Visit | Attending: Internal Medicine | Admitting: Internal Medicine

## 2016-12-12 ENCOUNTER — Encounter (HOSPITAL_COMMUNITY): Payer: Federal, State, Local not specified - PPO

## 2016-12-12 DIAGNOSIS — Z955 Presence of coronary angioplasty implant and graft: Secondary | ICD-10-CM | POA: Diagnosis not present

## 2016-12-14 ENCOUNTER — Encounter (HOSPITAL_COMMUNITY)
Admission: RE | Admit: 2016-12-14 | Discharge: 2016-12-14 | Disposition: A | Discharge status: Home / Self Care | Payer: Federal, State, Local not specified - PPO | Source: Ambulatory Visit | Attending: Internal Medicine | Admitting: Internal Medicine

## 2016-12-14 ENCOUNTER — Encounter (HOSPITAL_COMMUNITY): Payer: Federal, State, Local not specified - PPO

## 2016-12-14 DIAGNOSIS — Z955 Presence of coronary angioplasty implant and graft: Secondary | ICD-10-CM

## 2016-12-16 ENCOUNTER — Encounter (HOSPITAL_COMMUNITY)
Admission: RE | Admit: 2016-12-16 | Discharge: 2016-12-16 | Disposition: A | Discharge status: Home / Self Care | Payer: Federal, State, Local not specified - PPO | Source: Ambulatory Visit | Attending: Internal Medicine | Admitting: Internal Medicine

## 2016-12-16 ENCOUNTER — Encounter (HOSPITAL_COMMUNITY): Payer: Federal, State, Local not specified - PPO

## 2016-12-16 DIAGNOSIS — Z955 Presence of coronary angioplasty implant and graft: Secondary | ICD-10-CM | POA: Diagnosis not present

## 2016-12-19 ENCOUNTER — Encounter (HOSPITAL_COMMUNITY): Payer: Federal, State, Local not specified - PPO

## 2016-12-19 ENCOUNTER — Encounter (HOSPITAL_COMMUNITY)
Admission: RE | Admit: 2016-12-19 | Discharge: 2016-12-19 | Disposition: A | Discharge status: Home / Self Care | Payer: Federal, State, Local not specified - PPO | Source: Ambulatory Visit | Attending: Internal Medicine | Admitting: Internal Medicine

## 2016-12-19 DIAGNOSIS — Z955 Presence of coronary angioplasty implant and graft: Secondary | ICD-10-CM | POA: Diagnosis not present

## 2016-12-19 NOTE — Progress Notes (Signed)
Cardiac Individual Treatment Plan  Patient Details  Name: Pam [name] MRN: [id number] Date of Birth: 06-21-1951 Referring Provider:     CARDIAC REHAB PHASE II ORIENTATION from 09/27/2016 in [place]  Referring Provider  End, Harrell Gave MD      Initial Encounter Date:    CARDIAC REHAB PHASE II ORIENTATION from 09/27/2016 in [place]  Date  09/27/16  Referring Provider  End, Harrell Gave MD      Visit Diagnosis: 08/29/16 Status post coronary artery stent placement  Patient's Home Medications on Admission:  Current Outpatient Prescriptions:  .  acetaminophen (TYLENOL) 650 MG CR tablet, Take 650 mg by mouth every 8 (eight) hours as needed for pain (headache)., Disp: , Rfl:  .  aspirin 81 MG chewable tablet, Chew 1 tablet (81 mg total) by mouth daily., Disp: 30 tablet, Rfl: 0 .  atorvastatin (LIPITOR) 40 MG tablet, Take 1 tablet (40 mg total) by mouth daily., Disp: 30 tablet, Rfl: 0 .  carvedilol (COREG) 6.25 MG tablet, Take 1 tablet (6.25 mg total) by mouth 2 (two) times daily., Disp: 180 tablet, Rfl: 1 .  clopidogrel (PLAVIX) 75 MG tablet, Take 1 tablet (75 mg total) by mouth daily with breakfast., Disp: 30 tablet, Rfl: 11 .  levothyroxine (SYNTHROID, LEVOTHROID) 25 MCG tablet, Take 37.5 mcg by mouth daily before breakfast., Disp: , Rfl:  .  lisinopril (PRINIVIL,ZESTRIL) 5 MG tablet, Take 1 tablet (5 mg total) by mouth daily., Disp: 90 tablet, Rfl: 1 .  nitroGLYCERIN (NITROSTAT) 0.4 MG SL tablet, Place 1 tablet (0.4 mg total) under the tongue every 5 (five) minutes as needed for chest pain., Disp: 90 tablet, Rfl: 3 .  ranolazine (RANEXA) 500 MG 12 hr tablet, Take 1 tablet (500 mg total) by mouth 2 (two) times daily., Disp: 60 tablet, Rfl: 6  Past Medical History: Past Medical History:  Diagnosis Date  . Hyperlipidemia   . Thyroid disease     Tobacco Use: History  Smoking Status  . Former Smoker  . Packs/day:  1.00  . Years: 50.00  . Types: Cigarettes  . Quit date: 07/07/2016  Smokeless Tobacco  . Never Used    Labs: Recent Review Flowsheet Data    Labs for ITP Cardiac and Pulmonary Rehab Latest Ref Rng & Units 07/07/2016 07/09/2016   Cholestrol 0 - 200 mg/dL 174 -   LDLCALC 0 - 99 mg/dL 70 -   HDL >40 mg/dL 87 -   Trlycerides <150 mg/dL 87 -   Hemoglobin A1c 4.8 - 5.6 % - 5.3      Capillary Blood Glucose: No results found for: GLUCAP   Exercise Target Goals:    Exercise Program Goal: Individual exercise prescription set with THRR, safety & activity barriers. Participant demonstrates ability to understand and report RPE using BORG scale, to self-measure pulse accurately, and to acknowledge the importance of the exercise prescription.  Exercise Prescription Goal: Starting with aerobic activity 30 plus minutes a day, 3 days per week for initial exercise prescription. Provide home exercise prescription and guidelines that participant acknowledges understanding prior to discharge.  Activity Barriers & Risk Stratification:     Activity Barriers & Cardiac Risk Stratification - 09/27/16 1112      Activity Barriers & Cardiac Risk Stratification   Activity Barriers None   Cardiac Risk Stratification Moderate      6 Minute Walk:     6 Minute Walk    Row Name 09/27/16 1029  6 Minute Walk   Phase Initial     Distance 1178 feet     Walk Time 6 minutes     # of Rest Breaks 0     MPH 2.23     METS 3.01     RPE 13     VO2 Peak 10.53     Symptoms Yes (comment)     Comments Patient c/o dyspnea.     Resting HR 68 bpm     Resting BP 132/63     Max Ex. HR 98 bpm     Max Ex. BP 114/60     2 Minute Post BP 120/70        Oxygen Initial Assessment:   Oxygen Re-Evaluation:   Oxygen Discharge (Final Oxygen Re-Evaluation):   Initial Exercise Prescription:     Initial Exercise Prescription - 09/27/16 1000      Date of Initial Exercise RX and Referring Provider    Date 09/27/16   Referring Provider End, Harrell Gave MD     Bike   Level 0.5   Minutes 10   METs 2.72     Recumbant Bike   Level 2   RPM 20   Watts 10   Minutes 10   METs 1.4     NuStep   Level 2   SPM 70   Minutes 10   METs 2     Track   Laps 7   Minutes 10   METs 2.23     Prescription Details   Frequency (times per week) 3   Duration Progress to 30 minutes of continuous aerobic without signs/symptoms of physical distress     Intensity   THRR 40-80% of Max Heartrate 62-124   Ratings of Perceived Exertion 11-13   Perceived Dyspnea 0-4     Progression   Progression Continue to progress workloads to maintain intensity without signs/symptoms of physical distress.     Resistance Training   Training Prescription Yes   Weight 2lbs   Reps 10-15      Perform Capillary Blood Glucose checks as needed.  Exercise Prescription Changes:     Exercise Prescription Changes    Row Name 10/17/16 1600 11/17/16 1500 12/07/16 1600         Response to Exercise   Blood Pressure (Admit) 122/76 130/70 104/68     Blood Pressure (Exercise) 108/78 154/74 134/80     Blood Pressure (Exit) 104/60 109/73 116/74     Heart Rate (Admit) 74 bpm 77 bpm 61 bpm     Heart Rate (Exercise) 126 bpm 110 bpm 112 bpm     Heart Rate (Exit) 78 bpm 75 bpm 68 bpm     Rating of Perceived Exertion (Exercise) 11 11 11      Duration Progress to 45 minutes of aerobic exercise without signs/symptoms of physical distress Progress to 45 minutes of aerobic exercise without signs/symptoms of physical distress Progress to 45 minutes of aerobic exercise without signs/symptoms of physical distress     Intensity THRR unchanged THRR unchanged THRR unchanged       Progression   Progression Continue to progress workloads to maintain intensity without signs/symptoms of physical distress. Continue to progress workloads to maintain intensity without signs/symptoms of physical distress. Continue to progress workloads to  maintain intensity without signs/symptoms of physical distress.     Average METs 2.9 3.3 3.3       Resistance Training   Training Prescription Yes Yes Yes     Weight 2lbs 3lb  3lb     Reps 10-15 10-15 10-15       Recumbant Bike   Level 2 2 4      RPM 20 20 20      Watts 10 10 10      Minutes 10 10 10      METs 2.6 2.6 2.7       NuStep   Level 4 4 4      SPM 70 70 70     Minutes 10 10 10      METs 3.9 4 3.7       Track   Laps 14 14 13      Minutes 10 10 10      METs 3.43 3.43 3.26       Home Exercise Plan   Plans to continue exercise at Home (comment) Home (comment) Home (comment)     Frequency Add 4 additional days to program exercise sessions. Add 4 additional days to program exercise sessions. Add 4 additional days to program exercise sessions.     Initial Home Exercises Provided 10/12/16 10/12/16 10/12/16        Exercise Comments:     Exercise Comments    Row Name 10/24/16 1619 11/17/16 1527 12/14/16 1647       Exercise Comments Reviewed METs and goals with pt.  Pt is doing well with exercise.  Reviewed METs and goals with pt.  Pt is doing well with exercise.  Reviewed METs and goals with pt.  Pt is doing well with exercise.         Exercise Goals and Review:     Exercise Goals    Row Name 09/27/16 0849             Exercise Goals   Increase Physical Activity Yes       Intervention Provide advice, education, support and counseling about physical activity/exercise needs.;Develop an individualized exercise prescription for aerobic and resistive training based on initial evaluation findings, risk stratification, comorbidities and participant's personal goals.       Expected Outcomes Achievement of increased cardiorespiratory fitness and enhanced flexibility, muscular endurance and strength shown through measurements of functional capacity and personal statement of participant.       Increase Strength and Stamina Yes       Intervention Provide advice, education,  support and counseling about physical activity/exercise needs.;Develop an individualized exercise prescription for aerobic and resistive training based on initial evaluation findings, risk stratification, comorbidities and participant's personal goals.       Expected Outcomes Achievement of increased cardiorespiratory fitness and enhanced flexibility, muscular endurance and strength shown through measurements of functional capacity and personal statement of participant.          Exercise Goals Re-Evaluation :     Exercise Goals Re-Evaluation    Row Name 10/24/16 1613 11/17/16 1527 12/14/16 1647         Exercise Goal Re-Evaluation   Exercise Goals Review Increase Physical Activity;Increase Strenth and Stamina Increase Physical Activity;Increase Strenth and Stamina Increase Physical Activity;Increase Strenth and Stamina     Comments Pt states that she feels great and she is tolerating workload increases very well.  Unfortunately, due to some chest pain, Brenn has had to decrease some of her workloads and we will gradually reintroduce WL increases as tolerated. w/o signs/symptoms. Pt states that she feels great and she is tolerating workload increases very well.   Pt states that she feels great and she is tolerating workload increases very well.  Expected Outcomes Continue with exercise prescription in order to continue to build strength, increase stamina and overall cardiorespiratory fitness.   Continue with exercise prescription in order to continue to build strength, increase stamina and overall cardiorespiratory fitness.   Continue with exercise prescription in order to continue to build strength, increase stamina and overall cardiorespiratory fitness.           Discharge Exercise Prescription (Final Exercise Prescription Changes):     Exercise Prescription Changes - 12/07/16 1600      Response to Exercise   Blood Pressure (Admit) 104/68   Blood Pressure (Exercise) 134/80   Blood  Pressure (Exit) 116/74   Heart Rate (Admit) 61 bpm   Heart Rate (Exercise) 112 bpm   Heart Rate (Exit) 68 bpm   Rating of Perceived Exertion (Exercise) 11   Duration Progress to 45 minutes of aerobic exercise without signs/symptoms of physical distress   Intensity THRR unchanged     Progression   Progression Continue to progress workloads to maintain intensity without signs/symptoms of physical distress.   Average METs 3.3     Resistance Training   Training Prescription Yes   Weight 3lb   Reps 10-15     Recumbant Bike   Level 4   RPM 20   Watts 10   Minutes 10   METs 2.7     NuStep   Level 4   SPM 70   Minutes 10   METs 3.7     Track   Laps 13   Minutes 10   METs 3.26     Home Exercise Plan   Plans to continue exercise at Home (comment)   Frequency Add 4 additional days to program exercise sessions.   Initial Home Exercises Provided 10/12/16      Nutrition:  Target Goals: Understanding of nutrition guidelines, daily intake of sodium 1500mg , cholesterol 200mg , calories 30% from fat and 7% or less from saturated fats, daily to have 5 or more servings of fruits and vegetables.  Biometrics:     Pre Biometrics - 09/27/16 0841      Pre Biometrics   Waist Circumference 28.5 inches   Hip Circumference 38 inches   Waist to Hip Ratio 0.75 %   Triceps Skinfold 24 mm   % Body Fat 33 %   Grip Strength 22 kg   Flexibility 13.25 in   Single Leg Stand 30 seconds       Nutrition Therapy Plan and Nutrition Goals:     Nutrition Therapy & Goals - 10/17/16 1041      Nutrition Therapy   Diet Therapeutic Lifestyle Changes     Personal Nutrition Goals   Nutrition Goal Pt to maintain her wt while in Cardiac Rehab   Personal Goal #2 Pt to identify and limit foods high in sodium     Intervention Plan   Intervention Prescribe, educate and counsel regarding individualized specific dietary modifications aiming towards targeted core components such as weight,  hypertension, lipid management, diabetes, heart failure and other comorbidities.;Nutrition handout(s) given to patient.  Heart Failure Nutrition Therapy   Expected Outcomes Short Term Goal: Understand basic principles of dietary content, such as calories, fat, sodium, cholesterol and nutrients.;Long Term Goal: Adherence to prescribed nutrition plan.      Nutrition Discharge: Nutrition Scores:     Nutrition Assessments - 10/17/16 1040      Rate Your Plate Scores   Pre Score 31      Nutrition Goals Re-Evaluation:   Nutrition Goals  Re-Evaluation:   Nutrition Goals Discharge (Final Nutrition Goals Re-Evaluation):   Psychosocial: Target Goals: Acknowledge presence or absence of significant depression and/or stress, maximize coping skills, provide positive support system. Participant is able to verbalize types and ability to use techniques and skills needed for reducing stress and depression.  Initial Review & Psychosocial Screening:     Initial Psych Review & Screening - 10/24/16 1630      Screening Interventions   Interventions Encouraged to exercise      Quality of Life Scores:     Quality of Life - 09/27/16 1007      Quality of Life Scores   Health/Function Pre 23.1 %   Socioeconomic Pre 26 %   Psych/Spiritual Pre 24 %   Family Pre 26.4 %   GLOBAL Pre 24.32 %      PHQ-9: Recent Review Flowsheet Data    There is no flowsheet data to display.     Interpretation of Total Score  Total Score Depression Severity:  1-4 = Minimal depression, 5-9 = Mild depression, 10-14 = Moderate depression, 15-19 = Moderately severe depression, 20-27 = Severe depression   Psychosocial Evaluation and Intervention:   Psychosocial Re-Evaluation:     Psychosocial Re-Evaluation    Row Name 10/25/16 1319 11/23/16 1742 12/19/16 1736         Psychosocial Re-Evaluation   Current issues with None Identified None Identified None Identified     Interventions Stress management  education Encouraged to attend Cardiac Rehabilitation for the exercise Encouraged to attend Cardiac Rehabilitation for the exercise     Continue Psychosocial Services  No Follow up required No Follow up required No Follow up required        Psychosocial Discharge (Final Psychosocial Re-Evaluation):     Psychosocial Re-Evaluation - 12/19/16 1736      Psychosocial Re-Evaluation   Current issues with None Identified   Interventions Encouraged to attend Cardiac Rehabilitation for the exercise   Continue Psychosocial Services  No Follow up required      Vocational Rehabilitation: Provide vocational rehab assistance to qualifying candidates.   Vocational Rehab Evaluation & Intervention:     Vocational Rehab - 10/24/16 1623      Initial Vocational Rehab Evaluation & Intervention   Assessment shows need for Vocational Rehabilitation --      Education: Education Goals: Education classes will be provided on a weekly basis, covering required topics. Participant will state understanding/return demonstration of topics presented.  Learning Barriers/Preferences:     Learning Barriers/Preferences - 09/27/16 0848      Learning Barriers/Preferences   Learning Barriers Sight   Learning Preferences Written Material;Video;Skilled Demonstration      Education Topics: Count Your Pulse:  -Group instruction provided by verbal instruction, demonstration, patient participation and written materials to support subject.  Instructors address importance of being able to find your pulse and how to count your pulse when at home without a heart monitor.  Patients get hands on experience counting their pulse with staff help and individually.   Heart Attack, Angina, and Risk Factor Modification:  -Group instruction provided by verbal instruction, video, and written materials to support subject.  Instructors address signs and symptoms of angina and heart attacks.    Also discuss risk factors for heart  disease and how to make changes to improve heart health risk factors.   CARDIAC REHAB PHASE II EXERCISE from 11/18/2016 in [place]  Date  10/26/16  Instruction Review Code  2- meets goals/outcomes  Functional Fitness:  -Group instruction provided by verbal instruction, demonstration, patient participation, and written materials to support subject.  Instructors address safety measures for doing things around the house.  Discuss how to get up and down off the floor, how to pick things up properly, how to safely get out of a chair without assistance, and balance training.   Meditation and Mindfulness:  -Group instruction provided by verbal instruction, patient participation, and written materials to support subject.  Instructor addresses importance of mindfulness and meditation practice to help reduce stress and improve awareness.  Instructor also leads participants through a meditation exercise.    Stretching for Flexibility and Mobility:  -Group instruction provided by verbal instruction, patient participation, and written materials to support subject.  Instructors lead participants through series of stretches that are designed to increase flexibility thus improving mobility.  These stretches are additional exercise for major muscle groups that are typically performed during regular warm up and cool down.   Hands Only CPR:  -Group verbal, video, and participation provides a basic overview of AHA guidelines for community CPR. Role-play of emergencies allow participants the opportunity to practice calling for help and chest compression technique with discussion of AED use.   Hypertension: -Group verbal and written instruction that provides a basic overview of hypertension including the most recent diagnostic guidelines, risk factor reduction with self-care instructions and medication management.    Nutrition I class: Heart Healthy Eating:  -Group  instruction provided by PowerPoint slides, verbal discussion, and written materials to support subject matter. The instructor gives an explanation and review of the Therapeutic Lifestyle Changes diet recommendations, which includes a discussion on lipid goals, dietary fat, sodium, fiber, plant stanol/sterol esters, sugar, and the components of a well-balanced, healthy diet.   CARDIAC REHAB PHASE II EXERCISE from 11/18/2016 in [place]  Date  10/04/16  Educator  RD  Instruction Review Code  2- meets goals/outcomes      Nutrition II class: Lifestyle Skills:  -Group instruction provided by PowerPoint slides, verbal discussion, and written materials to support subject matter. The instructor gives an explanation and review of label reading, grocery shopping for heart health, heart healthy recipe modifications, and ways to make healthier choices when eating out.   Diabetes Question & Answer:  -Group instruction provided by PowerPoint slides, verbal discussion, and written materials to support subject matter. The instructor gives an explanation and review of diabetes co-morbidities, pre- and post-prandial blood glucose goals, pre-exercise blood glucose goals, signs, symptoms, and treatment of hypoglycemia and hyperglycemia, and foot care basics.   Diabetes Blitz:  -Group instruction provided by PowerPoint slides, verbal discussion, and written materials to support subject matter. The instructor gives an explanation and review of the physiology behind type 1 and type 2 diabetes, diabetes medications and rational behind using different medications, pre- and post-prandial blood glucose recommendations and Hemoglobin A1c goals, diabetes diet, and exercise including blood glucose guidelines for exercising safely.    Portion Distortion:  -Group instruction provided by PowerPoint slides, verbal discussion, written materials, and food models to support subject matter. The  instructor gives an explanation of serving size versus portion size, changes in portions sizes over the last 20 years, and what consists of a serving from each food group.   Stress Management:  -Group instruction provided by verbal instruction, video, and written materials to support subject matter.  Instructors review role of stress in heart disease and how to cope with stress positively.     CARDIAC  REHAB PHASE II EXERCISE from 11/18/2016 in [place]  Date  10/05/16  Instruction Review Code  2- meets goals/outcomes      Exercising on Your Own:  -Group instruction provided by verbal instruction, power point, and written materials to support subject.  Instructors discuss benefits of exercise, components of exercise, frequency and intensity of exercise, and end points for exercise.  Also discuss use of nitroglycerin and activating EMS.  Review options of places to exercise outside of rehab.  Review guidelines for sex with heart disease.   Cardiac Drugs I:  -Group instruction provided by verbal instruction and written materials to support subject.  Instructor reviews cardiac drug classes: antiplatelets, anticoagulants, beta blockers, and statins.  Instructor discusses reasons, side effects, and lifestyle considerations for each drug class.   Cardiac Drugs II:  -Group instruction provided by verbal instruction and written materials to support subject.  Instructor reviews cardiac drug classes: angiotensin converting enzyme inhibitors (ACE-I), angiotensin II receptor blockers (ARBs), nitrates, and calcium channel blockers.  Instructor discusses reasons, side effects, and lifestyle considerations for each drug class.   CARDIAC REHAB PHASE II EXERCISE from 11/18/2016 in [place]  Date  10/12/16  Educator  pharm  Instruction Review Code  2- meets goals/outcomes      Anatomy and Physiology of the Circulatory System:  Group verbal  and written instruction and models provide basic cardiac anatomy and physiology, with the coronary electrical and arterial systems. Review of: AMI, Angina, Valve disease, Heart Failure, Peripheral Artery Disease, Cardiac Arrhythmia, Pacemakers, and the ICD.   CARDIAC REHAB PHASE II EXERCISE from 11/18/2016 in [place]  Date  11/02/16  Instruction Review Code  2- meets goals/outcomes      Other Education:  -Group or individual verbal, written, or video instructions that support the educational goals of the cardiac rehab program.   Knowledge Questionnaire Score:     Knowledge Questionnaire Score - 09/27/16 1007      Knowledge Questionnaire Score   Pre Score 20/24      Core Components/Risk Factors/Patient Goals at Admission:     Personal Goals and Risk Factors at Admission - 09/27/16 1027      Core Components/Risk Factors/Patient Goals on Admission   Improve shortness of breath with ADL's Yes   Intervention Provide education, individualized exercise plan and daily activity instruction to help decrease symptoms of SOB with activities of daily living.   Expected Outcomes Short Term: Achieves a reduction of symptoms when performing activities of daily living.   Heart Failure Yes   Intervention Provide a combined exercise and nutrition program that is supplemented with education, support and counseling about heart failure. Directed toward relieving symptoms such as shortness of breath, decreased exercise tolerance, and extremity edema.   Expected Outcomes Improve functional capacity of life;Short term: Attendance in program 2-3 days a week with increased exercise capacity. Reported lower sodium intake. Reported increased fruit and vegetable intake. Reports medication compliance.;Short term: Daily weights obtained and reported for increase. Utilizing diuretic protocols set by physician.;Long term: Adoption of self-care skills and reduction of barriers for  early signs and symptoms recognition and intervention leading to self-care maintenance.   Lipids Yes   Intervention Provide education and support for participant on nutrition & aerobic/resistive exercise along with prescribed medications to achieve LDL 70mg , HDL >40mg .   Expected Outcomes Short Term: Participant states understanding of desired cholesterol values and is compliant with medications prescribed. Participant is  following exercise prescription and nutrition guidelines.;Long Term: Cholesterol controlled with medications as prescribed, with individualized exercise RX and with personalized nutrition plan. Value goals: LDL < 70mg , HDL > 40 mg.   Stress Yes   Intervention Offer individual and/or small group education and counseling on adjustment to heart disease, stress management and health-related lifestyle change. Teach and support self-help strategies.;Refer participants experiencing significant psychosocial distress to appropriate mental health specialists for further evaluation and treatment. When possible, include family members and significant others in education/counseling sessions.  medical stress   Expected Outcomes Short Term: Participant demonstrates changes in health-related behavior, relaxation and other stress management skills, ability to obtain effective social support, and compliance with psychotropic medications if prescribed.;Long Term: Emotional wellbeing is indicated by absence of clinically significant psychosocial distress or social isolation.      Core Components/Risk Factors/Patient Goals Review:    Core Components/Risk Factors/Patient Goals at Discharge (Final Review):    ITP Comments:     ITP Comments    Row Name 09/27/16 0840           ITP Comments Dr. Fransico Him, Medical Director          Comments: Emmajane is making expected progress toward personal goals after completing 33 sessions. Recommend continued exercise and life style modification education  including  stress management and relaxation techniques to decrease cardiac risk profile. Saysha continues to enjoy exercise at cardiac rehab without complaints of chest pain or discomfort. Elliona says her current medication regimen is working for her.Barnet Pall, RN,BSN 12/19/2016 5:42 PM

## 2016-12-21 ENCOUNTER — Encounter (HOSPITAL_COMMUNITY): Payer: Federal, State, Local not specified - PPO

## 2016-12-21 ENCOUNTER — Encounter (HOSPITAL_COMMUNITY)
Admission: RE | Admit: 2016-12-21 | Discharge: 2016-12-21 | Disposition: A | Discharge status: Home / Self Care | Payer: Federal, State, Local not specified - PPO | Source: Ambulatory Visit | Attending: Internal Medicine | Admitting: Internal Medicine

## 2016-12-21 DIAGNOSIS — Z955 Presence of coronary angioplasty implant and graft: Secondary | ICD-10-CM

## 2016-12-23 ENCOUNTER — Encounter (HOSPITAL_COMMUNITY): Payer: Federal, State, Local not specified - PPO

## 2016-12-23 ENCOUNTER — Encounter (HOSPITAL_COMMUNITY)
Admission: RE | Admit: 2016-12-23 | Discharge: 2016-12-23 | Disposition: A | Discharge status: Home / Self Care | Payer: Federal, State, Local not specified - PPO | Source: Ambulatory Visit | Attending: Internal Medicine | Admitting: Internal Medicine

## 2016-12-23 DIAGNOSIS — Z955 Presence of coronary angioplasty implant and graft: Secondary | ICD-10-CM | POA: Diagnosis not present

## 2016-12-28 ENCOUNTER — Encounter (HOSPITAL_COMMUNITY)
Admission: RE | Admit: 2016-12-28 | Discharge: 2016-12-28 | Disposition: A | Discharge status: Home / Self Care | Payer: Federal, State, Local not specified - PPO | Source: Ambulatory Visit | Attending: Internal Medicine | Admitting: Internal Medicine

## 2016-12-28 ENCOUNTER — Encounter (HOSPITAL_COMMUNITY): Payer: Federal, State, Local not specified - PPO

## 2016-12-28 VITALS — Wt 123.7 lb

## 2016-12-28 DIAGNOSIS — Z955 Presence of coronary angioplasty implant and graft: Secondary | ICD-10-CM

## 2016-12-28 NOTE — Progress Notes (Signed)
Discharge Summary  Patient Details  Name: Pam [name] MRN: [id number] Date of Birth: Nov 30, 1950 Referring Provider:     [place] from 09/27/2016 in [place]  Referring Provider  End, Harrell Gave MD       Number of Visits: 36  Reason for Discharge:  Patient independent in their exercise.  Smoking History:  History  Smoking Status  . Former Smoker  . Packs/day: 1.00  . Years: 50.00  . Types: Cigarettes  . Quit date: 07/07/2016  Smokeless Tobacco  . Never Used    Diagnosis:  08/29/16 Status post coronary artery stent placement  ADL UCSD:   Initial Exercise Prescription:     Initial Exercise Prescription - 09/27/16 1000      Date of Initial Exercise RX and Referring Provider   Date 09/27/16   Referring Provider End, Harrell Gave MD     Bike   Level 0.5   Minutes 10   METs 2.72     Recumbant Bike   Level 2   RPM 20   Watts 10   Minutes 10   METs 1.4     NuStep   Level 2   SPM 70   Minutes 10   METs 2     Track   Laps 7   Minutes 10   METs 2.23     Prescription Details   Frequency (times per week) 3   Duration Progress to 30 minutes of continuous aerobic without signs/symptoms of physical distress     Intensity   THRR 40-80% of Max Heartrate 62-124   Ratings of Perceived Exertion 11-13   Perceived Dyspnea 0-4     Progression   Progression Continue to progress workloads to maintain intensity without signs/symptoms of physical distress.     Resistance Training   Training Prescription Yes   Weight 2lbs   Reps 10-15      Discharge Exercise Prescription (Final Exercise Prescription Changes):     Exercise Prescription Changes - 01/04/17 1700      Response to Exercise   Blood Pressure (Admit) 104/70   Blood Pressure (Exercise) 130/80   Blood Pressure (Exit) 132/78   Heart Rate (Admit) 64 bpm   Heart Rate (Exercise) 101 bpm   Heart Rate (Exit) 72 bpm   Rating of Perceived  Exertion (Exercise) 9   Duration Progress to 45 minutes of aerobic exercise without signs/symptoms of physical distress   Intensity THRR unchanged     Progression   Progression Continue to progress workloads to maintain intensity without signs/symptoms of physical distress.   Average METs 3.5     Resistance Training   Training Prescription Yes   Weight 3lb   Reps 10-15     Recumbant Bike   Level 4   RPM 20   Watts 10   Minutes 10   METs 2.7     NuStep   Level 4   SPM 70   Minutes 10   METs 3.7     Track   Laps 13   Minutes 10   METs 3.26     Home Exercise Plan   Plans to continue exercise at Home (comment)   Frequency Add 4 additional days to program exercise sessions.   Initial Home Exercises Provided 10/12/16      Functional Capacity:     6 Minute Walk    Row Name 09/27/16 1029 01/04/17 1709       6 Minute Walk   Phase  Initial Discharge    Distance 1178 feet 1843 feet    Distance % Change  - 56.5 %    Walk Time 6 minutes 6 minutes    # of Rest Breaks 0 0    MPH 2.23 3.5    METS 3.01 4.7    RPE 13 10    VO2 Peak 10.53 16.5    Symptoms Yes (comment) No    Comments Patient c/o dyspnea.  -    Resting HR 68 bpm 67 bpm    Resting BP 132/63 104/60    Max Ex. HR 98 bpm 111 bpm    Max Ex. BP 114/60 164/90    2 Minute Post BP 120/70 132/78       Psychological, QOL, Others - Outcomes: PHQ 2/9: Depression screen PHQ 2/9 01/12/2017  Decreased Interest 0  Down, Depressed, Hopeless 0  PHQ - 2 Score 0    Quality of Life:     Quality of Life - 01/05/17 0724      Quality of Life Scores   Health/Function Pre 23.1 %   Health/Function Post 28.5 %   Health/Function % Change 23.38 %   Socioeconomic Pre 26 %   Socioeconomic Post 30 %   Socioeconomic % Change  15.38 %   Psych/Spiritual Pre 24 %   Psych/Spiritual Post 30 %   Psych/Spiritual % Change 25 %   Family Pre 26.4 %   Family Post 30 %   Family % Change 13.64 %   GLOBAL Pre 24.32 %   GLOBAL  Post 29.32 %   GLOBAL % Change 20.56 %      Personal Goals: Goals established at orientation with interventions provided to work toward goal.     Personal Goals and Risk Factors at Admission - 09/27/16 1027      Core Components/Risk Factors/Patient Goals on Admission   Improve shortness of breath with ADL's Yes   Intervention Provide education, individualized exercise plan and daily activity instruction to help decrease symptoms of SOB with activities of daily living.   Expected Outcomes Short Term: Achieves a reduction of symptoms when performing activities of daily living.   Heart Failure Yes   Intervention Provide a combined exercise and nutrition program that is supplemented with education, support and counseling about heart failure. Directed toward relieving symptoms such as shortness of breath, decreased exercise tolerance, and extremity edema.   Expected Outcomes Improve functional capacity of life;Short term: Attendance in program 2-3 days a week with increased exercise capacity. Reported lower sodium intake. Reported increased fruit and vegetable intake. Reports medication compliance.;Short term: Daily weights obtained and reported for increase. Utilizing diuretic protocols set by physician.;Long term: Adoption of self-care skills and reduction of barriers for early signs and symptoms recognition and intervention leading to self-care maintenance.   Lipids Yes   Intervention Provide education and support for participant on nutrition & aerobic/resistive exercise along with prescribed medications to achieve LDL '70mg'$ , HDL >'40mg'$ .   Expected Outcomes Short Term: Participant states understanding of desired cholesterol values and is compliant with medications prescribed. Participant is following exercise prescription and nutrition guidelines.;Long Term: Cholesterol controlled with medications as prescribed, with individualized exercise RX and with personalized nutrition plan. Value goals: LDL <  '70mg'$ , HDL > 40 mg.   Stress Yes   Intervention Offer individual and/or small group education and counseling on adjustment to heart disease, stress management and health-related lifestyle change. Teach and support self-help strategies.;Refer participants experiencing significant psychosocial distress to appropriate mental health  specialists for further evaluation and treatment. When possible, include family members and significant others in education/counseling sessions.  medical stress   Expected Outcomes Short Term: Participant demonstrates changes in health-related behavior, relaxation and other stress management skills, ability to obtain effective social support, and compliance with psychotropic medications if prescribed.;Long Term: Emotional wellbeing is indicated by absence of clinically significant psychosocial distress or social isolation.       Personal Goals Discharge:   Nutrition & Weight - Outcomes:     Pre Biometrics - 09/27/16 0841      Pre Biometrics   Waist Circumference 28.5 inches   Hip Circumference 38 inches   Waist to Hip Ratio 0.75 %   Triceps Skinfold 24 mm   % Body Fat 33 %   Grip Strength 22 kg   Flexibility 13.25 in   Single Leg Stand 30 seconds         Post Biometrics - 01/05/17 0721       Post  Biometrics   Weight 123 lb 10.9 oz (56.1 kg)   Waist Circumference 28.5 inches   Hip Circumference 37.75 inches   Waist to Hip Ratio 0.75 %   Triceps Skinfold 28 mm   % Body Fat 33.6 %   Grip Strength 30 kg   Flexibility 11 in   Single Leg Stand 17.33 seconds      Nutrition:     Nutrition Therapy & Goals - 10/17/16 1041      Nutrition Therapy   Diet Therapeutic Lifestyle Changes     Personal Nutrition Goals   Nutrition Goal Pt to maintain her wt while in Cardiac Rehab   Personal Goal #2 Pt to identify and limit foods high in sodium     Intervention Plan   Intervention Prescribe, educate and counsel regarding individualized specific dietary  modifications aiming towards targeted core components such as weight, hypertension, lipid management, diabetes, heart failure and other comorbidities.;Nutrition handout(s) given to patient.  Heart Failure Nutrition Therapy   Expected Outcomes Short Term Goal: Understand basic principles of dietary content, such as calories, fat, sodium, cholesterol and nutrients.;Long Term Goal: Adherence to prescribed nutrition plan.      Nutrition Discharge:     Nutrition Assessments - 01/09/17 1341      MEDFICTS Scores   Pre Score 31   Post Score 29   Score Difference -2      Education Questionnaire Score:     Knowledge Questionnaire Score - 01/04/17 1709      Knowledge Questionnaire Score   Post Score 20/24      Goals reviewed with patient; copy given to patient.Kendallyn graduated from cardiac rehab program today with completion of 36 exercise sessions in Phase II. Pt maintained good attendance and progressed nicely during his participation in rehab as evidenced by increased MET level.   Medication list reconciled. Repeat  PHQ score-  0.  Iviana  has made significant lifestyle changes and should be commended for his success. Tenya feels she has achieved her goals during cardiac rehab.   Tera plans to continue exercise by walking and may consider participating in the maintenance program at a later date and time.Barnet Pall, RN,BSN 01/12/2017 11:58 AM

## 2016-12-30 ENCOUNTER — Encounter (HOSPITAL_COMMUNITY): Payer: Federal, State, Local not specified - PPO

## 2017-01-02 ENCOUNTER — Encounter (HOSPITAL_COMMUNITY): Payer: Federal, State, Local not specified - PPO

## 2017-01-04 ENCOUNTER — Encounter (HOSPITAL_COMMUNITY): Payer: Federal, State, Local not specified - PPO

## 2017-01-06 ENCOUNTER — Encounter (HOSPITAL_COMMUNITY): Payer: Federal, State, Local not specified - PPO

## 2017-01-09 ENCOUNTER — Encounter (HOSPITAL_COMMUNITY): Payer: Federal, State, Local not specified - PPO

## 2017-01-11 ENCOUNTER — Encounter (HOSPITAL_COMMUNITY): Payer: Federal, State, Local not specified - PPO

## 2017-01-13 ENCOUNTER — Encounter (HOSPITAL_COMMUNITY): Payer: Federal, State, Local not specified - PPO

## 2017-01-16 ENCOUNTER — Encounter (HOSPITAL_COMMUNITY): Payer: Federal, State, Local not specified - PPO

## 2017-01-18 ENCOUNTER — Encounter (HOSPITAL_COMMUNITY): Payer: Federal, State, Local not specified - PPO

## 2017-01-20 ENCOUNTER — Encounter (HOSPITAL_COMMUNITY): Payer: Federal, State, Local not specified - PPO

## 2017-01-23 ENCOUNTER — Encounter (HOSPITAL_COMMUNITY): Payer: Federal, State, Local not specified - PPO

## 2017-01-25 ENCOUNTER — Encounter (HOSPITAL_COMMUNITY): Payer: Federal, State, Local not specified - PPO

## 2017-01-27 ENCOUNTER — Encounter (HOSPITAL_COMMUNITY): Payer: Federal, State, Local not specified - PPO

## 2017-01-30 ENCOUNTER — Encounter (HOSPITAL_COMMUNITY): Payer: Federal, State, Local not specified - PPO

## 2017-02-01 ENCOUNTER — Encounter (HOSPITAL_COMMUNITY): Payer: Federal, State, Local not specified - PPO

## 2017-02-03 ENCOUNTER — Encounter (HOSPITAL_COMMUNITY): Payer: Federal, State, Local not specified - PPO

## 2017-02-06 ENCOUNTER — Encounter (HOSPITAL_COMMUNITY): Payer: Federal, State, Local not specified - PPO

## 2017-02-08 ENCOUNTER — Encounter (HOSPITAL_COMMUNITY): Payer: Federal, State, Local not specified - PPO

## 2017-02-10 ENCOUNTER — Encounter (HOSPITAL_COMMUNITY): Payer: Federal, State, Local not specified - PPO

## 2017-02-13 ENCOUNTER — Encounter (HOSPITAL_COMMUNITY): Payer: Federal, State, Local not specified - PPO

## 2017-02-17 ENCOUNTER — Telehealth: Payer: Self-pay | Admitting: Internal Medicine

## 2017-02-17 ENCOUNTER — Ambulatory Visit (INDEPENDENT_AMBULATORY_CARE_PROVIDER_SITE_OTHER): Payer: Federal, State, Local not specified - PPO | Admitting: Internal Medicine

## 2017-02-17 ENCOUNTER — Encounter: Payer: Self-pay | Admitting: Internal Medicine

## 2017-02-17 VITALS — BP 112/60 | HR 59 | Ht 63.0 in | Wt 119.8 lb

## 2017-02-17 DIAGNOSIS — I5022 Chronic systolic (congestive) heart failure: Secondary | ICD-10-CM | POA: Diagnosis not present

## 2017-02-17 DIAGNOSIS — I255 Ischemic cardiomyopathy: Secondary | ICD-10-CM

## 2017-02-17 DIAGNOSIS — I25118 Atherosclerotic heart disease of native coronary artery with other forms of angina pectoris: Secondary | ICD-10-CM | POA: Diagnosis not present

## 2017-02-17 LAB — BASIC METABOLIC PANEL
BUN/Creatinine Ratio: 14 (ref 12–28)
BUN: 13 mg/dL (ref 8–27)
CALCIUM: 10.2 mg/dL (ref 8.7–10.3)
CO2: 23 mmol/L (ref 20–29)
CREATININE: 0.95 mg/dL (ref 0.57–1.00)
Chloride: 91 mmol/L — ABNORMAL LOW (ref 96–106)
GFR calc Af Amer: 73 mL/min/{1.73_m2} (ref >59)
GFR calc non Af Amer: 63 mL/min/{1.73_m2} (ref >59)
GLUCOSE: 89 mg/dL (ref 65–99)
POTASSIUM: 5.4 mmol/L — AB (ref 3.5–5.2)
SODIUM: 130 mmol/L — AB (ref 134–144)

## 2017-02-17 MED ORDER — RANOLAZINE ER 1000 MG PO TB12: 1000.0000 mg | ORAL_TABLET | Freq: Two times a day (BID) | ORAL | 3 refills | Status: DC

## 2017-02-17 MED ORDER — LOSARTAN POTASSIUM 25 MG PO TABS: 25.0000 mg | ORAL_TABLET | Freq: Every day | ORAL | 1 refills | Status: DC

## 2017-02-17 NOTE — Patient Instructions (Signed)
Medication Instructions:   Increase ranexa (ranolozine) to 1000 mg two times a day. You can take 2 of your 500mg  tablets two times a day and use your current supply.  Stop lisinopril Start losartan 25 mg daily  Labwork: BMET today. BMET in 2 weeks  Testing/Procedures: None   Follow-Up: Your physician recommends that you schedule a follow-up appointment in: 2 weeks in the [facility]pert[facility]. Your physician recommends that you schedule a follow-up appointment in: 3 months with Dr End.           If you need a refill on your cardiac medications before your next appointment, please call your pharmacy.

## 2017-02-17 NOTE — Telephone Encounter (Signed)
°  Follow Up   Pt has some additional questions for you. Please call.

## 2017-02-17 NOTE — Telephone Encounter (Signed)
I spoke with patient and answered her questions.

## 2017-02-17 NOTE — Progress Notes (Signed)
Follow-up Outpatient Visit Date: 02/17/2017  Primary Care Provider: Libby Maw, MD [address] [place] [address]  Chief Complaint: Chest pain and fatigue  HPI:  Pam [name] is a 66 y.o. year-old female with history of coronary artery disease, ischemic cardiomyopathy, hyperlipidemia, and prior tobacco use, who presents for follow-up after NSTEMI in 07/2016 and subsequent PCI to the LCx in 08/2016. The patient initially presented after a mechanical fall during which she fell forward while taking out her trash, striking her face and for head on her driveway. She had difficulty getting up and was ultimately brought to the emergency department by EMS. EKG demonstrated subtle anterolateral ST elevations, though the patient was chest pain-free. Cardiac catheterization was initially deferred but subsequently performed due to mild troponin elevation, which peaked at 0.27. Catheterization revealed chronic total occlusion of the mid LAD as well as high-grade disease of the proximal LCx. Cardiac MRI revealed anterior and apical scar without viable myocardium and an LVEF of 38%. Due to her lack of cardiac symptoms and recent fall with multiple facial abrasions and lacerations, the decision was made to defer intervention to the LCx. Staged PCI to the LCx with orbital atherectomy was subsequently performed on 08/29/16.  I last saw the patient on 11/18/16, at which time she was doing relatively well. She had experienced 2 episodes of chest discomfort while at cardiac rehabilitation in March, prompting escalation of carvedilol. She continued to have mild chest discomfort when pushing herself, though the pain was mild. She had noted some fluctuations of blood pressure at cardiac rehabilitation, sometimes borderline low with brief mild lightheadedness. We elected to defer escalating her heart failure and antianginal regimen at that time. Today, she reports feeling "pretty good." She continues to have  exertional dyspnea and fatigue when she pushes herself. She also has occasional chest pain with activity, though this resolves quickly with rest. She has not needed to use NTG. She denies palpitations but has occasional brief orthostatic lightheadedness. She continues with ranolazine, which improved her exertional angina. She is not exercising much after completing cardiac rehab but would like to join the continuation program [facility] in order to get back into exercising. She is tolerating her medications well, though she has noted an intermittent dry cough for the last several months, occasionally accompanied by sneezing.  --------------------------------------------------------------------------------------------------  Cardiovascular History & Procedures: Cardiovascular Problems:  Coronary artery disease with NSTEMI and chronic total occlusion of the proximal/mid LAD  Ischemic cardiomyopathy  Risk Factors:  Known coronary artery disease, hyperlipidemia, tobacco use, age  Cath/PCI:  PCI (08/29/16): Significant left coronary artery disease, including CTO of the mid LAD and 80% calcified proximal LCx stenosis extending into large OM1 branch. Successful IVUS-guided PCI utilizing orbital atherectomy with placement of a Synergy 3.0 x 32 mm drug-eluting stent. Normal left ventricular filling pressure. Significant right radial artery spasm precluded guide catheter advancement. Therefore, femoral access was used for the intervention.  LHC (07/08/16): Significant two-vessel coronary artery disease including totally calcified chronic total occlusion of the proximal/mid LAD and calcified 80% stenosis of proximal LCx/OM one. LVEF 30% with anterior, apical, and apical inferior disc low normal LVEDP (4 mmHg).  CV Surgery:  None  EP Procedures and Devices:  None  Non-Invasive Evaluation(s):  Right groin ultrasound (09/16/16): Patent arteries and veins without pseudoaneurysm, AV fistula, or  obstruction.  Cardiac MRI (07/11/16): Normal LV size and wall thickness with moderately reduced contraction (EF 38%). Akinesis/dyskinesis of the mid and apical anterior/anteroseptal and apical  segments. LAD territory is nonviable. RCA and LCx territories are viable. Normal RV size and function. Normal biatrial size.  Transthoracic echocardiogram (07/08/16): Normal LV size and wall thickness with LVEF of 35-40%. Anterior and apical wall motion abnormality present. Grade 1 diastolic dysfunction. Normal RV size and function. No significant valvular abnormalities.  Recent CV Pertinent Labs: Lab Results  Component Value Date   CHOL 174 07/07/2016   HDL 87 07/07/2016   LDLCALC 70 07/07/2016   TRIG 87 07/07/2016   CHOLHDL 2.0 07/07/2016   INR 1.1 08/25/2016   K 3.8 08/30/2016   BUN 10 08/30/2016   BUN 12 08/25/2016   CREATININE 0.67 08/30/2016    Past medical and surgical history were reviewed and updated in EPIC.  Outpatient Encounter Prescriptions as of 02/17/2017  Medication Sig  . acetaminophen (TYLENOL) 650 MG CR tablet Take 650 mg by mouth every 8 (eight) hours as needed for pain (headache).  Marland Kitchen aspirin 81 MG chewable tablet Chew 1 tablet (81 mg total) by mouth daily.  Marland Kitchen atorvastatin (LIPITOR) 40 MG tablet Take 1 tablet (40 mg total) by mouth daily.  . carvedilol (COREG) 6.25 MG tablet Take 1 tablet (6.25 mg total) by mouth 2 (two) times daily.  . clopidogrel (PLAVIX) 75 MG tablet Take 1 tablet (75 mg total) by mouth daily with breakfast.  . levothyroxine (SYNTHROID, LEVOTHROID) 25 MCG tablet Take 37.5 mcg by mouth daily before breakfast.  . lisinopril (PRINIVIL,ZESTRIL) 5 MG tablet Take 1 tablet (5 mg total) by mouth daily.  . nitroGLYCERIN (NITROSTAT) 0.4 MG SL tablet Place 1 tablet (0.4 mg total) under the tongue every 5 (five) minutes as needed for chest pain.  . ranolazine (RANEXA) 500 MG 12 hr tablet Take 1 tablet (500 mg total) by mouth 2 (two) times daily.   No  facility-administered encounter medications on file as of 02/17/2017.     Allergies: Patient has no known allergies.  Social History   Social History  . Marital status: Married    Spouse name: N/A  . Number of children: 2  . Years of education: N/A   Occupational History  . Retired-worked for [employer]    Social History Main Topics  . Smoking status: Former Smoker    Packs/day: 1.00    Years: 50.00    Types: Cigarettes    Quit date: 07/07/2016  . Smokeless tobacco: Never Used  . Alcohol use No  . Drug use: No  . Sexual activity: Not on file   Other Topics Concern  . Not on file   Social History Narrative  . No narrative on file    Family History  Problem Relation Age of Onset  . Kidney failure Mother     Review of Systems: Discoloration under both eyes still present (significant bruising was present after fall in December). Otherwise, a 12 system review of systems was performed and was negative, except as noted in the HPI.  --------------------------------------------------------------------------------------------------  Physical Exam: BP 112/60   Pulse (!) 59   Ht 5\' 3"  (1.6 m)   Wt 119 lb 12.8 oz (54.3 kg)   SpO2 99%   BMI 21.22 kg/m   General:  Slender woman, seated comfortably in the exam room. HEENT: No conjunctival pallor or scleral icterus.  Moist mucous membranes.  OP clear. Neck: Supple without lymphadenopathy, thyromegaly, JVD, or HJR.  No carotid bruit. Lungs: Normal work of breathing.  Clear to auscultation bilaterally without wheezes or crackles. Heart: Regular rate and rhythm without murmurs, rubs,  or gallops.  Non-displaced PMI. Abd: Bowel sounds present.  Soft, NT/ND without hepatosplenomegaly Ext: No lower extremity edema.  Radial, PT, and DP pulses are 2+ bilaterally. Skin: Warm and dry without rash.   Lab Results  Component Value Date   WBC 7.3 08/30/2016   HGB 11.5 (L) 08/30/2016   HCT 33.8 (L) 08/30/2016   MCV 87.8 08/30/2016     PLT 219 08/30/2016    Lab Results  Component Value Date   NA 134 (L) 08/30/2016   K 3.8 08/30/2016   CL 102 08/30/2016   CO2 26 08/30/2016   BUN 10 08/30/2016   CREATININE 0.67 08/30/2016   GLUCOSE 109 (H) 08/30/2016   ALT 27 07/07/2016    Lab Results  Component Value Date   CHOL 174 07/07/2016   HDL 87 07/07/2016   LDLCALC 70 07/07/2016   TRIG 87 07/07/2016   CHOLHDL 2.0 07/07/2016   --------------------------------------------------------------------------------------------------  ASSESSMENT AND PLAN: Coronary artery disease with stable angina Pam [name] still has some exertional chest pain (CCS class II), though this resolves promptly with rest. We have agreed to increase ranolazine to 1000 mg BID to see if this improves her symptoms. There were no targets for further revascularization following her last catheterization, as mid and distal LAD territory did not show viability on cardiac MRI. We will continue with 12 months of DAPT from time of PCI in January.  Chronic systolic heart failure secondary to ischemic cardiomyopathy Pam [name] appears euvolemic with NYHA class II symptoms. Given her non-productive cough, we will try switching from lisinopril to losartan. We will continue with carvedilol 6.25 mg BID, given resting heart rate in the 50's today. We will check a BMP today and again in ~2 weeks, given switch from lisinopril to losartan. Once ARB and beta-blocker are maximized, we could consider adding spironolactone.  Follow-up: Return to clinic in 3 months.  [name], MD 02/17/2017 10:53 AM

## 2017-03-02 NOTE — Progress Notes (Addendum)
Patient ID: Pam [name]                 DOB: 03/19/1951                      MRN: [id number]     HPI: Pam [name] is a 66 y.o. female referred by Dr. Saunders Revel to HTN clinic. PMH is significant for coronary artery disease w/ NSTEMI in 2017 s/p PCI, ischemic cardiomyopathy, hyperlipidemia, CHF (LV EF 35-40% in 07/2016), and prior tobacco use. Underwent LHC in 07/08/16 and PCI on 08/29/16. At the 02/17/17 office visit, pt reported an intermittent dry cough that persisted for several months, prompting a switch from lisinopril to losartan. Pt's ranolazine dose was increased to 1,000 mg BID at this visit as well.  Plan is to optimize HF medications including addition of spironolactone if BP and BMET allows. BMET checked 2 weeks ago showed elevated K at 5.4, rechecking today.   Pt presents today in good spirits. She states she feels different while on losartan and higher dose of Ranexa, which she has been taking for a week. Had a bad dizziness spell the day after her medication changes, which caused her to take a dose of nitroglycerin. This improved her symptoms and she has had no recurrent episodes. She still gets a little dizzy from time to time, and dizziness is worse when she stands up fast or moving. No falls. No blurred vision or headaches. Still has cough from lisinopril but feels like it's getting better. BP significantly elevated at today's visit compared to previous BP readings. BP checked manually 3x and was consistently in the 150s/80s. Pt does not have a BP cuff to check readings at home.  Current HTN meds: Carvedilol 6.25 mg PO 1 tab BID, losartan 25 mg PO 1 tab daily, ranolazine 1,000 mg BID Previously tried: Lisinopril - stopped due to cough BP goal: <130/80 mmHg  Family History: Mother had kidney failure  Social History: Former smoker, quit in 07/07/2016.   Diet: Stays well hydrated. Some coffee, about half a pot daily no sugar just cream.Trying to eat healthy with fruits, looks for low sodium  options. Does not add salt to her food.   Exercise: Trying to walk more recently. Looking to start cardiac rehab program at [facility].   Home BP readings: No cuff at home.   Wt Readings from Last 3 Encounters:  02/17/17 119 lb 12.8 oz (54.3 kg)  01/05/17 123 lb 10.9 oz (56.1 kg)  11/18/16 123 lb 12.8 oz (56.2 kg)   BP Readings from Last 3 Encounters:  02/17/17 112/60  11/18/16 (!) 106/50  10/21/16 122/78   Pulse Readings from Last 3 Encounters:  02/17/17 (!) 59  11/18/16 68  10/21/16 74    Renal function: Estimated Creatinine Clearance: 48.8 mL/min (by C-G formula based on SCr of 0.95 mg/dL).  Past Medical History:  Diagnosis Date  . Hyperlipidemia   . Thyroid disease     Current Outpatient Prescriptions on File Prior to Visit  Medication Sig Dispense Refill  . acetaminophen (TYLENOL) 650 MG CR tablet Take 650 mg by mouth every 8 (eight) hours as needed for pain (headache).    Marland Kitchen aspirin 81 MG chewable tablet Chew 1 tablet (81 mg total) by mouth daily. 30 tablet 0  . atorvastatin (LIPITOR) 40 MG tablet Take 1 tablet (40 mg total) by mouth daily. 30 tablet 0  . carvedilol (COREG) 6.25 MG tablet Take 1 tablet (6.25 mg total) by  mouth 2 (two) times daily. 180 tablet 1  . clopidogrel (PLAVIX) 75 MG tablet Take 1 tablet (75 mg total) by mouth daily with breakfast. 30 tablet 11  . levothyroxine (SYNTHROID, LEVOTHROID) 25 MCG tablet Take 37.5 mcg by mouth daily before breakfast.    . losartan (COZAAR) 25 MG tablet Take 1 tablet (25 mg total) by mouth daily. 90 tablet 1  . nitroGLYCERIN (NITROSTAT) 0.4 MG SL tablet Place 1 tablet (0.4 mg total) under the tongue every 5 (five) minutes as needed for chest pain. 90 tablet 3  . ranolazine (RANEXA) 1000 MG SR tablet Take 1 tablet (1,000 mg total) by mouth 2 (two) times daily. 180 tablet 3   No current facility-administered medications on file prior to visit.     No Known Allergies   Assessment/Plan:  1. Hypertension/HF  medication optimization - BP is elevated above goal <130/82mmHg at today's visit. BP is abnormally high compared to patient's usual clinic readings. BMET drawn today shows her K improved to 4.8. Therefore will start pt on spironolactone 12.5mg  daily. Rx called in and pt is aware. She was also advised to increase sodium intake slightly due to low Na. Will continue carvedilol 6.25mg  BID due to HR in low 60s. Continue losartan 25mg  daily and Ranexa 1,000mg  BID. Recommend pt purchase a home BP cuff. Follow up with pt in 1 week for BMET and 4 weeks for HTN follow up.  -Barkley Boards, PharmD Student  Megan E. Supple, PharmD, CPP, [place] [address] [place], [place] [address] Phone: [phone]; Fax: [phone] 03/03/2017 10:22 AM

## 2017-03-03 ENCOUNTER — Ambulatory Visit (INDEPENDENT_AMBULATORY_CARE_PROVIDER_SITE_OTHER): Payer: Federal, State, Local not specified - PPO | Admitting: Pharmacist

## 2017-03-03 ENCOUNTER — Other Ambulatory Visit: Payer: Federal, State, Local not specified - PPO | Admitting: *Deleted

## 2017-03-03 ENCOUNTER — Encounter: Payer: Self-pay | Admitting: Pharmacist

## 2017-03-03 DIAGNOSIS — I10 Essential (primary) hypertension: Secondary | ICD-10-CM | POA: Insufficient documentation

## 2017-03-03 DIAGNOSIS — I5022 Chronic systolic (congestive) heart failure: Secondary | ICD-10-CM

## 2017-03-03 HISTORY — DX: Essential (primary) hypertension: I10

## 2017-03-03 LAB — BASIC METABOLIC PANEL
BUN/Creatinine Ratio: 12 (ref 12–28)
BUN: 11 mg/dL (ref 8–27)
CALCIUM: 9.9 mg/dL (ref 8.7–10.3)
CO2: 23 mmol/L (ref 20–29)
Chloride: 89 mmol/L — ABNORMAL LOW (ref 96–106)
Creatinine, Ser: 0.93 mg/dL (ref 0.57–1.00)
GFR calc Af Amer: 75 mL/min/{1.73_m2} (ref >59)
GFR calc non Af Amer: 65 mL/min/{1.73_m2} (ref >59)
GLUCOSE: 85 mg/dL (ref 65–99)
Potassium: 4.8 mmol/L (ref 3.5–5.2)
Sodium: 127 mmol/L — ABNORMAL LOW (ref 134–144)

## 2017-03-03 MED ORDER — SPIRONOLACTONE 25 MG PO TABS: 12.5000 mg | ORAL_TABLET | Freq: Every day | ORAL | 11 refills | Status: DC

## 2017-03-03 NOTE — Patient Instructions (Signed)
Continue your medications for now - we will check lab work today. If everything is stable, we will plan to start spironolactone 12.5mg  (1/2 tablet) once a day with close follow up in clinic in 1 week for lab and blood pressure check  Try to purchase a blood pressure cuff and keep track of your readings at home

## 2017-03-03 NOTE — Addendum Note (Signed)
Addended by: Mady Oubre E on: 03/03/2017 04:20 PM   Modules accepted: Orders

## 2017-03-07 ENCOUNTER — Telehealth: Payer: Self-pay | Admitting: *Deleted

## 2017-03-07 DIAGNOSIS — E871 Hypo-osmolality and hyponatremia: Secondary | ICD-10-CM

## 2017-03-07 NOTE — Telephone Encounter (Signed)
I think it is fine to increase losartan to 50 mg daily. We will have to watch her BP carefully, though, as it is typically on the low side. Maybe we can recheck her BP when she comes in for the f/u BMP. Thanks.

## 2017-03-07 NOTE — Telephone Encounter (Signed)
Notes recorded by [name], MD on 03/05/2017 at 8:17 PM EDT Please let Pam [name] know that her kidney function and potassium are normal. However, her sodium continues to decline. I recommend that she stop taking spironolactone and that we recheck her sodium in about 2 weeks. She should continue the remainder of her medications. Thanks.   03/07/17--  I spoke with patient, she is aware to stop spironolactone, repeat BMET scheduled for 03/21/17.  I will forward to [facility], pharmacist in HTN Clinic for follow-up with pt about BP and BMET scheduled for 03/10/17

## 2017-03-07 NOTE — Telephone Encounter (Signed)
Pt was hypertensive at last visit - will forward to Dr End to see if he is ok with increasing losartan from 25mg  to 50mg  daily (< 2% incidence of hyponatremia) prior to next BMET to help control BP or if he'd prefer to wait until after f/u BMET on 8/21 to ensure sodium is improving.

## 2017-03-07 NOTE — Telephone Encounter (Signed)
Pt will increase losartan to 50 mg daily (she will take 2 of her 25 mg tablets for now), repeat BMET and BP check in HTN Clinic scheduled for 03/21/17.

## 2017-03-10 ENCOUNTER — Other Ambulatory Visit: Payer: Federal, State, Local not specified - PPO

## 2017-03-20 NOTE — Progress Notes (Deleted)
Patient ID: Pam [name]                 DOB: 10-09-1950                      MRN: [id number]     HPI: Pam [name] is a 66 y.o. female referred by Dr. Saunders Revel to HTN clinic. PMH is significant for coronary artery disease w/ NSTEMI in 2017 s/p PCI, ischemic cardiomyopathy, hyperlipidemia, CHF (LV EF 35-40% in 07/2016), and prior tobacco use. Underwent LHC in 07/08/16 and PCI on 08/29/16. At the 02/17/17 office visit, pt reported an intermittent dry cough that persisted for several months, prompting a switch from lisinopril to losartan. Pt's ranolazine dose was increased to 1,000 mg BID at this visit as well. Plan is to optimize HF medications. At pt's last HTN clinic visit, pt's BP was found to be significantly elevated compared to previous readings. Spironolactone 12.5 mg was started given the improvement in her K level to 4.8 per BMET drawn on 8/3. However given low, declining Na level at 127, spironolactone was ultimately stopped on 8/7, and her losartan dose was increased to 50 mg daily instead. Pt is to get f/u BMET on 8/21 and BP check in clinic per Dr. Saunders Revel.   Current HTN meds: Carvedilol 6.25 mg PO 1 tab BID, losartan 50 mg PO 1 tab daily, ranolazine 1,000 mg BID  Previously tried: Lisinopril - stopped due to cough  BP goal: <130/80 mmHg  Family History: Mother had kidney failure  Social History: Former smoker, quit in 07/07/2016.   Diet: Stays well hydrated. Some coffee, about half a pot daily no sugar just cream.Trying to eat healthy with fruits, looks for low sodium options. Does not add salt to her food.    Exercise: Trying to walk more recently. Looking to start cardiac rehab program at [facility] BP readings: No cuff at home.   Wt Readings from Last 3 Encounters:  03/03/17 122 lb 8 oz (55.6 kg)  02/17/17 119 lb 12.8 oz (54.3 kg)  01/05/17 123 lb 10.9 oz (56.1 kg)   BP Readings from Last 3 Encounters:  03/03/17 (!) 154/74  02/17/17 112/60  11/18/16 (!) 106/50   Pulse  Readings from Last 3 Encounters:  03/03/17 64  02/17/17 (!) 59  11/18/16 68    Renal function: CrCl cannot be calculated (Unknown ideal weight.).  Past Medical History:  Diagnosis Date  . Hyperlipidemia   . Thyroid disease     Current Outpatient Prescriptions on File Prior to Visit  Medication Sig Dispense Refill  . acetaminophen (TYLENOL) 650 MG CR tablet Take 650 mg by mouth every 8 (eight) hours as needed for pain (headache).    Marland Kitchen aspirin 81 MG chewable tablet Chew 1 tablet (81 mg total) by mouth daily. 30 tablet 0  . atorvastatin (LIPITOR) 40 MG tablet Take 1 tablet (40 mg total) by mouth daily. 30 tablet 0  . carvedilol (COREG) 6.25 MG tablet Take 1 tablet (6.25 mg total) by mouth 2 (two) times daily. 180 tablet 1  . clopidogrel (PLAVIX) 75 MG tablet Take 1 tablet (75 mg total) by mouth daily with breakfast. 30 tablet 11  . levothyroxine (SYNTHROID, LEVOTHROID) 25 MCG tablet Take 37.5 mcg by mouth daily before breakfast.    . losartan (COZAAR) 50 MG tablet Pt will take 2 of her 25 mg tablets daily at the same time    . nitroGLYCERIN (NITROSTAT) 0.4 MG SL tablet Place  1 tablet (0.4 mg total) under the tongue every 5 (five) minutes as needed for chest pain. 90 tablet 3  . ranolazine (RANEXA) 1000 MG SR tablet Take 1 tablet (1,000 mg total) by mouth 2 (two) times daily. 180 tablet 3   No current facility-administered medications on file prior to visit.     Allergies  Allergen Reactions  . Lisinopril     Dry cough  . Spironolactone Other (See Comments)    Low sodium     Assessment/Plan:

## 2017-03-21 ENCOUNTER — Ambulatory Visit (INDEPENDENT_AMBULATORY_CARE_PROVIDER_SITE_OTHER): Payer: Federal, State, Local not specified - PPO | Admitting: Pharmacist

## 2017-03-21 ENCOUNTER — Encounter: Payer: Self-pay | Admitting: Pharmacist

## 2017-03-21 ENCOUNTER — Other Ambulatory Visit: Payer: Federal, State, Local not specified - PPO | Admitting: *Deleted

## 2017-03-21 VITALS — BP 118/64 | HR 60

## 2017-03-21 DIAGNOSIS — E871 Hypo-osmolality and hyponatremia: Secondary | ICD-10-CM

## 2017-03-21 DIAGNOSIS — I10 Essential (primary) hypertension: Secondary | ICD-10-CM | POA: Diagnosis not present

## 2017-03-21 LAB — BASIC METABOLIC PANEL
BUN / CREAT RATIO: 12 (ref 12–28)
BUN: 12 mg/dL (ref 8–27)
CHLORIDE: 92 mmol/L — AB (ref 96–106)
CO2: 23 mmol/L (ref 20–29)
Calcium: 9.9 mg/dL (ref 8.7–10.3)
Creatinine, Ser: 0.97 mg/dL (ref 0.57–1.00)
GFR calc Af Amer: 71 mL/min/{1.73_m2} (ref >59)
GFR calc non Af Amer: 61 mL/min/{1.73_m2} (ref >59)
GLUCOSE: 78 mg/dL (ref 65–99)
POTASSIUM: 5.2 mmol/L (ref 3.5–5.2)
Sodium: 129 mmol/L — ABNORMAL LOW (ref 134–144)

## 2017-03-21 NOTE — Progress Notes (Signed)
Patient ID: Pam [name]                 DOB: August 10, 1950                      MRN: [id number]     HPI: Pam [name] is a 66 y.o. female referred by Dr. Saunders Revel to HTN clinic. PMH is significant for coronary artery disease w/ NSTEMI in 2017 s/p PCI, ischemic cardiomyopathy, hyperlipidemia, CHF (LV EF 35-40% in 07/2016), and prior tobacco use. Underwent LHC in 07/08/16 and PCI on 08/29/16. At the 02/17/17 office visit, pt reported an intermittent dry cough that persisted for several months, prompting a switch from lisinopril to losartan. Pt's ranolazine dose was increased to 1,000 mg BID at this visit as well. Plan is to optimize HF medications. At pt's last HTN clinic visit, pt's BP was found to be significantly elevated compared to previous readings. Spironolactone 12.5 mg was started given the improvement in her K level to 4.8 per BMET drawn on 8/3. However given low, declining Na level at 127, spironolactone was ultimately stopped on 8/7, and her losartan dose was increased to 50 mg daily instead. Pt with f/u BMET today and BP check in clinic per Dr. Saunders Revel.   She presents today for follow up. She states she feels queasy and dizzy in the mornings which is slowly improving. She is unsure if this is related to the increase in losartan or the increase in Ranexa though she states she was ok on the increase of Ranexa for several days prior to the losartan increase. She states she feels this queasiness in the morning but it improves throughout the day. She denies SOB and chest pain. She states she would like to consider change back to lisinopril if the nausea does not improve as she states she would rather deal with cough, but she will continue losartan for now.   Has not checked her blood pressure since last visit.   Current HTN meds: Carvedilol 6.25 mg PO 1 tab BID, losartan 50 mg PO 1 tab daily, ranolazine 1,000 mg BID  Previously tried: Lisinopril - stopped due to cough  BP goal: <130/80 mmHg  Family History:  Mother had kidney failure  Social History: Former smoker, quit in 07/07/2016.   Diet: Stays well hydrated. Some coffee, about half a pot daily no sugar just cream.Trying to eat healthy with fruits, looks for low sodium options. Does not add salt to her food.   Exercise: Trying to walk more recently. Looking to start cardiac rehab program at [facility] BP readings: No cuff at home.   Wt Readings from Last 3 Encounters:  03/03/17 122 lb 8 oz (55.6 kg)  02/17/17 119 lb 12.8 oz (54.3 kg)  01/05/17 123 lb 10.9 oz (56.1 kg)   BP Readings from Last 3 Encounters:  03/03/17 (!) 154/74  02/17/17 112/60  11/18/16 (!) 106/50   Pulse Readings from Last 3 Encounters:  03/03/17 64  02/17/17 (!) 59  11/18/16 68    Renal function: CrCl cannot be calculated (Unknown ideal weight.).  Past Medical History:  Diagnosis Date  . Hyperlipidemia   . Thyroid disease     Current Outpatient Prescriptions on File Prior to Visit  Medication Sig Dispense Refill  . acetaminophen (TYLENOL) 650 MG CR tablet Take 650 mg by mouth every 8 (eight) hours as needed for pain (headache).    Marland Kitchen aspirin 81 MG chewable tablet Chew 1 tablet (81 mg  total) by mouth daily. 30 tablet 0  . atorvastatin (LIPITOR) 40 MG tablet Take 1 tablet (40 mg total) by mouth daily. 30 tablet 0  . carvedilol (COREG) 6.25 MG tablet Take 1 tablet (6.25 mg total) by mouth 2 (two) times daily. 180 tablet 1  . clopidogrel (PLAVIX) 75 MG tablet Take 1 tablet (75 mg total) by mouth daily with breakfast. 30 tablet 11  . levothyroxine (SYNTHROID, LEVOTHROID) 25 MCG tablet Take 37.5 mcg by mouth daily before breakfast.    . losartan (COZAAR) 50 MG tablet Pt will take 2 of her 25 mg tablets daily at the same time    . nitroGLYCERIN (NITROSTAT) 0.4 MG SL tablet Place 1 tablet (0.4 mg total) under the tongue every 5 (five) minutes as needed for chest pain. 90 tablet 3  . ranolazine (RANEXA) 1000 MG SR tablet Take 1 tablet (1,000 mg total) by  mouth 2 (two) times daily. 180 tablet 3   No current facility-administered medications on file prior to visit.     Allergies  Allergen Reactions  . Lisinopril     Dry cough  . Spironolactone Other (See Comments)    Low sodium     Assessment/Plan: Hypertension: BMET today. BP controlled today on current regimen. Pending results will continue all medications as prescribed and she will follow up with Dr. Saunders Revel as scheduled. She will call if nausea persists and she wishes to change losartan due to nausea.      Thank you, Pam [name]. Patterson [facility]ers[facility], [place]  [address], [place], [place] [address]  Phone: [phone]; Fax: [phone] 03/21/2017 7:40 AM

## 2017-03-21 NOTE — Patient Instructions (Addendum)
Thank you for coming in today.   Our office will call you with the results for your blood work  Return for a follow up appointment as schedule with Dr. Saunders Revel. Please call [phone] if the nausea continues.   Check your blood pressure at home daily (if able) and keep record of the readings.  Take your BP meds as follows: CONTINUE ALL AS PRESCRIBED.   Bring all of your meds, your BP cuff and your record of home blood pressures to your next appointment.  Exercise as you're able, try to walk approximately 30 minutes per day.  Keep salt intake to a minimum, especially watch canned and prepared boxed foods.  Eat more fresh fruits and vegetables and fewer canned items.  Avoid eating in fast food restaurants.

## 2017-03-30 ENCOUNTER — Ambulatory Visit: Payer: Federal, State, Local not specified - PPO

## 2017-03-31 ENCOUNTER — Ambulatory Visit: Payer: Federal, State, Local not specified - PPO

## 2017-04-07 ENCOUNTER — Other Ambulatory Visit: Payer: Self-pay | Admitting: *Deleted

## 2017-04-07 MED ORDER — LOSARTAN POTASSIUM 50 MG PO TABS: 50.0000 mg | ORAL_TABLET | Freq: Every day | ORAL | 3 refills | Status: DC

## 2017-04-07 NOTE — Telephone Encounter (Signed)
Patient called and stated that Dr End changed her losartan dose from 25 mg to 50 mg. She has been taking two of the 25 mg tablets but she is almost out of them. She is requesting an rx for the 50 mg tablets. She is aware that I will send this in for her as requested.

## 2017-04-27 ENCOUNTER — Other Ambulatory Visit: Payer: Self-pay | Admitting: Internal Medicine

## 2017-04-27 DIAGNOSIS — I5022 Chronic systolic (congestive) heart failure: Secondary | ICD-10-CM

## 2017-04-27 DIAGNOSIS — I25119 Atherosclerotic heart disease of native coronary artery with unspecified angina pectoris: Secondary | ICD-10-CM

## 2017-04-27 MED ORDER — CARVEDILOL 6.25 MG PO TABS: 6.2500 mg | ORAL_TABLET | Freq: Two times a day (BID) | ORAL | 2 refills | Status: DC

## 2017-05-22 ENCOUNTER — Ambulatory Visit (INDEPENDENT_AMBULATORY_CARE_PROVIDER_SITE_OTHER): Payer: Federal, State, Local not specified - PPO | Admitting: Internal Medicine

## 2017-05-22 ENCOUNTER — Encounter: Payer: Self-pay | Admitting: Internal Medicine

## 2017-05-22 ENCOUNTER — Encounter (INDEPENDENT_AMBULATORY_CARE_PROVIDER_SITE_OTHER): Payer: Self-pay

## 2017-05-22 VITALS — BP 156/72 | HR 60 | Ht 63.0 in | Wt 122.8 lb

## 2017-05-22 DIAGNOSIS — E785 Hyperlipidemia, unspecified: Secondary | ICD-10-CM

## 2017-05-22 DIAGNOSIS — I10 Essential (primary) hypertension: Secondary | ICD-10-CM

## 2017-05-22 DIAGNOSIS — I25118 Atherosclerotic heart disease of native coronary artery with other forms of angina pectoris: Secondary | ICD-10-CM | POA: Diagnosis not present

## 2017-05-22 DIAGNOSIS — I5022 Chronic systolic (congestive) heart failure: Secondary | ICD-10-CM

## 2017-05-22 DIAGNOSIS — E871 Hypo-osmolality and hyponatremia: Secondary | ICD-10-CM | POA: Diagnosis not present

## 2017-05-22 DIAGNOSIS — I255 Ischemic cardiomyopathy: Secondary | ICD-10-CM | POA: Diagnosis not present

## 2017-05-22 MED ORDER — ATORVASTATIN CALCIUM 80 MG PO TABS: 80.0000 mg | ORAL_TABLET | Freq: Every day | ORAL | 1 refills | Status: DC

## 2017-05-22 NOTE — Progress Notes (Signed)
Follow-up Outpatient Visit Date: 05/22/2017  Primary Care Provider: Libby Maw, MD [address] [address]  Chief Complaint: Follow-up coronary artery disease  HPI:  Ms. Chrystal is a 66 y.o. year-old female with history of coronary artery disease with chronic total occlusion of the mid LAD and PCI to the LCx in 08/1998, ischemic cardiomyopathy, hyperlipidemia, and prior tobacco use, who presents for follow-up of coronary artery disease. I last saw Ms. Noon in July, at which time she continued to have brief episodes of chest pain. Today, she has continued to have brief episodes of chest pain, somewhat more atypical with a pulsating sensation in her chest. She notes stable exertional dyspnea. She had one episode of severe chest pain rest with accompanying lightheadedness and difficulty speaking that resolved within a few minutes of taking a single some nitroglycerin. The episode occurred about a month ago and has not happened again.  Ms. Krawiec has not been checking her blood pressure at home but notes that it was normal when she saw her PCP earlier this month. Additional labs were performed for further evaluation of her chronic hyponatremia that has persisted despite discontinuation of thiazide diuretics.  Ms. Cifuentes notes that her weight has been steady. She denies edema, orthopnea, and PND. She has occasional orthostatic lightheadedness and brief palpitations, unchanged. She has not had any further falls.  --------------------------------------------------------------------------------------------------  Cardiovascular History & Procedures: Cardiovascular Problems:  Coronary artery disease with NSTEMI and chronic total occlusion of the proximal/mid LAD  Ischemic cardiomyopathy  Risk Factors:  Known coronary artery disease, hyperlipidemia, tobacco use, age  Cath/PCI:  PCI (08/29/16): Significant left coronary artery disease, including CTO of the mid LAD and 80%  calcified proximal LCx stenosis extending into large OM1 branch. Successful IVUS-guided PCI utilizing orbital atherectomy with placement of a Synergy 3.0 x 32 mm drug-eluting stent. Normal left ventricular filling pressure. Significant right radial artery spasm precluded guide catheter advancement. Therefore, femoral access was used for the intervention.  LHC (07/08/16): Significant two-vessel coronary artery disease including totally calcified chronic total occlusion of the proximal/mid LAD and calcified 80% stenosis of proximal LCx/OM one. LVEF 30% with anterior, apical, and apical inferior disc low normal LVEDP (4 mmHg).  CV Surgery:  None  EP Procedures and Devices:  None  Non-Invasive Evaluation(s):  Right groin ultrasound (09/16/16): Patent arteries and veins without pseudoaneurysm, AV fistula, or obstruction.  Cardiac MRI (07/11/16): Normal LV size and wall thickness with moderately reduced contraction (EF 38%). Akinesis/dyskinesis of the mid and apical anterior/anteroseptal and apical segments. LAD territory is nonviable. RCA and LCx territories are viable. Normal RV size and function. Normal biatrial size.  Transthoracic echocardiogram (07/08/16): Normal LV size and wall thickness with LVEF of 35-40%. Anterior and apical wall motion abnormality present. Grade 1 diastolic dysfunction. Normal RV size and function. No significant valvular abnormalities.  Recent CV Pertinent Labs: Lab Results  Component Value Date   CHOL 174 07/07/2016   HDL 87 07/07/2016   LDLCALC 70 07/07/2016   TRIG 87 07/07/2016   CHOLHDL 2.0 07/07/2016   INR 1.1 08/25/2016   K 5.2 03/21/2017   BUN 12 03/21/2017   CREATININE 0.97 03/21/2017    Past medical and surgical history were reviewed and updated in EPIC.  Current Meds  Medication Sig  . acetaminophen (TYLENOL) 650 MG CR tablet Take 650 mg by mouth every 8 (eight) hours as needed for pain (headache).  Marland Kitchen aspirin 81 MG chewable tablet Chew 1 tablet  (81 mg  total) by mouth daily.  Marland Kitchen atorvastatin (LIPITOR) 40 MG tablet Take 1 tablet (40 mg total) by mouth daily.  . carvedilol (COREG) 6.25 MG tablet Take 1 tablet (6.25 mg total) by mouth 2 (two) times daily.  . clopidogrel (PLAVIX) 75 MG tablet Take 1 tablet (75 mg total) by mouth daily with breakfast.  . levothyroxine (SYNTHROID, LEVOTHROID) 25 MCG tablet Take 37.5 mcg by mouth daily before breakfast.  . losartan (COZAAR) 50 MG tablet Take 1 tablet (50 mg total) by mouth daily.  . nitroGLYCERIN (NITROSTAT) 0.4 MG SL tablet Place 1 tablet (0.4 mg total) under the tongue every 5 (five) minutes as needed for chest pain.  . ranolazine (RANEXA) 1000 MG SR tablet Take 1 tablet (1,000 mg total) by mouth 2 (two) times daily.    Allergies: Lisinopril and Spironolactone  Social History   Social History  . Marital status: Married    Spouse name: N/A  . Number of children: 2  . Years of education: N/A   Occupational History  . Retired-worked for post office    Social History Main Topics  . Smoking status: Former Smoker    Packs/day: 1.00    Years: 50.00    Types: Cigarettes    Quit date: 07/07/2016  . Smokeless tobacco: Never Used  . Alcohol use No  . Drug use: No  . Sexual activity: Not on file   Other Topics Concern  . Not on file   Social History Narrative  . No narrative on file    Family History  Problem Relation Age of Onset  . Kidney failure Mother     Review of Systems: A 12-system review of systems was performed and was negative except as noted in the HPI.  --------------------------------------------------------------------------------------------------  Physical Exam: BP (!) 156/72   Pulse 60   Ht 5\' 3"  (1.6 m)   Wt 122 lb 12.8 oz (55.7 kg)   SpO2 97%   BMI 21.75 kg/m   General:  Thin woman, seated comfortably in exam room. HEENT: No conjunctival pallor or scleral icterus. Moist mucous membranes.  OP clear. Neck: Supple without lymphadenopathy,  thyromegaly, JVD, or HJR.  Lungs: Normal work of breathing. Clear to auscultation bilaterally without wheezes or crackles. Heart: Regular rate and rhythm without murmurs, rubs, or gallops. Non-displaced PMI. Abd: Bowel sounds present. Soft, NT/ND without hepatosplenomegaly Ext: No lower extremity edema. Radial, PT, and DP pulses are 2+ bilaterally. Skin: Warm and dry without rash.  Lab Results  Component Value Date   WBC 7.3 08/30/2016   HGB 11.5 (L) 08/30/2016   HCT 33.8 (L) 08/30/2016   MCV 87.8 08/30/2016   PLT 219 08/30/2016    Lab Results  Component Value Date   NA 129 (L) 03/21/2017   K 5.2 03/21/2017   CL 92 (L) 03/21/2017   CO2 23 03/21/2017   BUN 12 03/21/2017   CREATININE 0.97 03/21/2017   GLUCOSE 78 03/21/2017   ALT 27 07/07/2016    Lab Results  Component Value Date   CHOL 174 07/07/2016   HDL 87 07/07/2016   LDLCALC 70 07/07/2016   TRIG 87 07/07/2016   CHOLHDL 2.0 07/07/2016    --------------------------------------------------------------------------------------------------  ASSESSMENT AND PLAN: Coronary artery disease with stable angina Ms. Santaella continues to have occasional brief episodes of chest pain. Overall, frequency and intensity are stable. We will continue with carvedilol and ranolazine as well as as needed nitroglycerin. She will need to remain on dual antiplatelet therapy through at least January. I encouraged  her to continue exercising.  Chronic systolic heart failure secondary to ischemic cardiomyopathy Ms. Dwiggins appears euvolemic with NYHA class II symptoms. Low heart rate precludes further up titration of carvedilol. We discussed escalation of losartan, but given her systemic lightheadedness, we have agreed to defer this.  Hypertension Blood pressure is mildly elevated today but has been normal at most recent prior visits, including with her PCP earlier this month. Given her orthostatic lightheadedness, we have agreed to defer medication  changes today.  Hyperlipidemia Goal LDL is less than 70. Lipid panel earlier this month by her PCP actually showed rise in the LDL to 99. We have agreed to increase atorvastatin to 80 mg daily and recheck a lipid panel and ALT in about 3 months.  Hyponatremia This has become a chronic problem without clear etiology. Ms. Dereck Leep appears euvolemic. She is no longer on thiazide diuretic. I will defer further workup and management to her PCP.  Follow-up: Return to clinic in 3 months.  [name], MD 05/22/2017 10:30 AM

## 2017-05-22 NOTE — Patient Instructions (Addendum)
Medication Instructions:  Increase lipitor (atorvastatin) to 80 mg daily. You can take 2 of your 40 mg tablets daily at the same time and use your current supply.  Labwork: Your physician recommends that you return for a FASTING lipid profile /ALT in about 3 months.   Testing/Procedures: None   Follow-Up: Your physician recommends that you schedule a follow-up appointment in: about 3 months with Dr End a few days after you have the lab done.      If you need a refill on your cardiac medications before your next appointment, please call your pharmacy.

## 2017-08-21 ENCOUNTER — Encounter (INDEPENDENT_AMBULATORY_CARE_PROVIDER_SITE_OTHER): Payer: Self-pay

## 2017-08-21 ENCOUNTER — Other Ambulatory Visit: Payer: Federal, State, Local not specified - PPO | Admitting: *Deleted

## 2017-08-21 DIAGNOSIS — I25118 Atherosclerotic heart disease of native coronary artery with other forms of angina pectoris: Secondary | ICD-10-CM

## 2017-08-21 DIAGNOSIS — I10 Essential (primary) hypertension: Secondary | ICD-10-CM

## 2017-08-21 DIAGNOSIS — E785 Hyperlipidemia, unspecified: Secondary | ICD-10-CM

## 2017-08-21 LAB — LIPID PANEL
CHOL/HDL RATIO: 1.9 ratio (ref 0.0–4.4)
Cholesterol, Total: 179 mg/dL (ref 100–199)
HDL: 95 mg/dL (ref >39)
LDL Calculated: 74 mg/dL (ref 0–99)
Triglycerides: 50 mg/dL (ref 0–149)
VLDL Cholesterol Cal: 10 mg/dL (ref 5–40)

## 2017-08-21 LAB — BASIC METABOLIC PANEL
BUN / CREAT RATIO: 13 (ref 12–28)
BUN: 12 mg/dL (ref 8–27)
CHLORIDE: 98 mmol/L (ref 96–106)
CO2: 24 mmol/L (ref 20–29)
CREATININE: 0.92 mg/dL (ref 0.57–1.00)
Calcium: 9.5 mg/dL (ref 8.7–10.3)
GFR calc Af Amer: 75 mL/min/{1.73_m2} (ref >59)
GFR calc non Af Amer: 65 mL/min/{1.73_m2} (ref >59)
GLUCOSE: 87 mg/dL (ref 65–99)
Potassium: 4.7 mmol/L (ref 3.5–5.2)
Sodium: 135 mmol/L (ref 134–144)

## 2017-08-21 LAB — ALT: ALT: 22 IU/L (ref 0–32)

## 2017-08-24 NOTE — Progress Notes (Signed)
Follow-up Outpatient Visit Date: 08/28/2017  Primary Care Provider: Loraine Leriche., MD [address] [address]  Chief Complaint: Shortness of breath and palpitation  HPI:  Pam [name] is a 67 y.o. year-old female with history of coronary artery disease with chronic total occlusion of the mid LAD and PCI to the LCx in 08/1998, ischemic cardiomyopathy, hyperlipidemia, and prior tobacco use, who presents for follow-up of coronary artery disease and ischemic cardiomyopathy. I last saw Pam [name] in 05/2017, at which time she reported continued brief episodes of chest pain from time to time. Given stability of symptoms, we elected not to change her regiment of carvedilol and ranolazine. We opted to increase atorvastatin to 80 mg daily, as her LDL was still above goal.  Today, Pam [name] reports that she has been doing "okay." After our last visit in October, Pam [name] was started on salt tablets by her PCP due to hyponatremia. While taking the salt tabs, she felt more swollen and short of breath. She also gained some weight. She stopped taking the tablets ~4 days ago. Over the last few weeks, Pam [name] has noted intermittent palpitations that she describes as a rapid heart beat. She is most aware of the episodes at night. They typically occur about 1-2x/week and last 2-3 minutes. She notes accompanying shortness of breath and chest pain. She took sublingual NTG once with improvement in the discomfort. She has not felt lightheaded nor passed out. There are no clear precipitants for the episodes, though she is concerned that recent salt tablet use may have contributed to these symptoms.  --------------------------------------------------------------------------------------------------  Cardiovascular History & Procedures: Cardiovascular Problems:  Coronary artery disease with NSTEMI and chronic total occlusion of the proximal/mid LAD  Ischemic cardiomyopathy  Risk Factors:  Known  coronary artery disease, hyperlipidemia, tobacco use, age  Cath/PCI:  PCI (08/29/16): Significant left coronary artery disease, including CTO of the mid LAD and 80% calcified proximal LCx stenosis extending into large OM1 branch. Successful IVUS-guided PCI utilizing orbital atherectomy with placement of a Synergy 3.0 x 32 mm drug-eluting stent. Normal left ventricular filling pressure. Significant right radial artery spasm precluded guide catheter advancement. Therefore, femoral access was used for the intervention.  LHC (07/08/16): Significant two-vessel coronary artery disease including totally calcified chronic total occlusion of the proximal/mid LAD and calcified 80% stenosis of proximal LCx/OM one. LVEF 30% with anterior, apical, and apical inferior disc low normal LVEDP (4 mmHg).  CV Surgery:  None  EP Procedures and Devices:  None  Non-Invasive Evaluation(s):  Right groin ultrasound (09/16/16): Patent arteries and veins without pseudoaneurysm, AV fistula, or obstruction.  Cardiac MRI (07/11/16): Normal LV size and wall thickness with moderately reduced contraction (EF 38%). Akinesis/dyskinesis of the mid and apical anterior/anteroseptal and apical segments. LAD territory is nonviable. RCA and LCx territories are viable. Normal RV size and function. Normal biatrial size.  Transthoracic echocardiogram (07/08/16): Normal LV size and wall thickness with LVEF of 35-40%. Anterior and apical wall motion abnormality present. Grade 1 diastolic dysfunction. Normal RV size and function. No significant valvular abnormalities.  Recent CV Pertinent Labs: Lab Results  Component Value Date   CHOL 179 08/21/2017   HDL 95 08/21/2017   LDLCALC 74 08/21/2017   TRIG 50 08/21/2017   CHOLHDL 1.9 08/21/2017   CHOLHDL 2.0 07/07/2016   INR 1.1 08/25/2016   K 4.7 08/21/2017   BUN 12 08/21/2017   CREATININE 0.92 08/21/2017    Past medical and surgical history were reviewed and  updated in  EPIC.  Current Meds  Medication Sig  . acetaminophen (TYLENOL) 650 MG CR tablet Take 650 mg by mouth every 8 (eight) hours as needed for pain (headache).  Marland Kitchen aspirin 81 MG chewable tablet Chew 1 tablet (81 mg total) by mouth daily.  . carvedilol (COREG) 6.25 MG tablet Take 1 tablet (6.25 mg total) by mouth 2 (two) times daily.  . clopidogrel (PLAVIX) 75 MG tablet Take 1 tablet (75 mg total) by mouth daily with breakfast.  . levothyroxine (SYNTHROID, LEVOTHROID) 25 MCG tablet Take 37.5 mcg by mouth daily before breakfast.  . losartan (COZAAR) 50 MG tablet Take 1 tablet (50 mg total) by mouth daily.  . ranolazine (RANEXA) 1000 MG SR tablet Take 1 tablet (1,000 mg total) by mouth 2 (two) times daily.    Allergies: Lisinopril and Spironolactone  Social History   Socioeconomic History  . Marital status: Married    Spouse name: Not on file  . Number of children: 2  . Years of education: Not on file  . Highest education level: Not on file  Social Needs  . Financial resource strain: Not on file  . Food insecurity - worry: Not on file  . Food insecurity - inability: Not on file  . Transportation needs - medical: Not on file  . Transportation needs - non-medical: Not on file  Occupational History  . Occupation: Retired-worked for post office  Tobacco Use  . Smoking status: Former Smoker    Packs/day: 1.00    Years: 50.00    Pack years: 50.00    Types: Cigarettes    Last attempt to quit: 07/07/2016    Years since quitting: 1.1  . Smokeless tobacco: Never Used  Substance and Sexual Activity  . Alcohol use: No  . Drug use: No  . Sexual activity: Not on file  Other Topics Concern  . Not on file  Social History Narrative  . Not on file    Family History  Problem Relation Age of Onset  . Kidney failure Mother     Review of Systems: A 12-system review of systems was performed and was negative except as noted in the  HPI.  --------------------------------------------------------------------------------------------------  Physical Exam: BP 126/66   Pulse 63   Ht 5\' 3"  (1.6 m)   Wt 126 lb 6.4 oz (57.3 kg)   SpO2 98%   BMI 22.39 kg/m   General:  Thin woman, seated comfortably in the exam room. HEENT: No conjunctival pallor or scleral icterus. Moist mucous membranes.  OP clear. Neck: Supple without lymphadenopathy, thyromegaly, JVD, or HJR. No carotid bruit. Lungs: Normal work of breathing. Clear to auscultation bilaterally without wheezes or crackles. Heart: Regular rate and rhythm without murmurs, rubs, or gallops. Non-displaced PMI. Abd: Bowel sounds present. Soft, NT/ND without hepatosplenomegaly Ext: 1+ pretibial edema bilaterally. Radial, PT, and DP pulses are 2+ bilaterally. Skin: Warm and dry without rash.  EKG:  NSR with anterolateral T-wave inversions, slightly more pronounced than on the prior tracing.  Lab Results  Component Value Date   WBC 7.3 08/30/2016   HGB 11.5 (L) 08/30/2016   HCT 33.8 (L) 08/30/2016   MCV 87.8 08/30/2016   PLT 219 08/30/2016    Lab Results  Component Value Date   NA 135 08/21/2017   K 4.7 08/21/2017   CL 98 08/21/2017   CO2 24 08/21/2017   BUN 12 08/21/2017   CREATININE 0.92 08/21/2017   GLUCOSE 87 08/21/2017   ALT 22 08/21/2017  Lab Results  Component Value Date   CHOL 179 08/21/2017   HDL 95 08/21/2017   LDLCALC 74 08/21/2017   TRIG 50 08/21/2017   CHOLHDL 1.9 08/21/2017    --------------------------------------------------------------------------------------------------  ASSESSMENT AND PLAN: Coronary artery disease with stable angina Other than chest pain associated with intermittent palpitations, angina has been well-controlled. We will continue with current doses of carvedilol and ranolazine. We will also plan to continue indefinite DAPT with aspirin and clopidogrel.  Chronic systolic heart failure secondary to ischemic  cardiomyopathy Pam [name] appears mildly volume overloaded with 4 pound weight gain since 05/22/17 and NYHA class II symptoms. I suspect this is driving by salt tablets recommended by her primary care provider. I have advised Pam [name] to avoid salt (including salt tablets), as this could be very detrimental in the setting of her cardiomyopathy. We discussed adding low-dose furosemide, but Pam [name] wishes to defer this. I will have her return to our office in 2 weeks to reassess her volume status. If she continues to have edema or be above her baseline weight from 05/2017, we will need to add low-dose furosemide.  Hyperlipidemia LDL improved but still slightly above goal. We will continue with atorvastatin 80 mg daily and work on lifestyle modifications with the hope of achieving our goal of LDL < 70.  Palpitations New development over the last few weeks. EKG today shows NSR. I have recommended ambulatory event monitoring, given risk for ventricular arrhythmia with history of ischemic cardiomyopathy. Pam [name] wishes to defer this for now; it will needed to be readdressed when she returns for follow-up in 2 weeks.  Hypertension BP well-controlled today. No medication changes at this time.  Hyponatremia Sodium normal on last check a week ago. No medication changes today. Sodium supplementation should be avoided in the future, given history of heart failure and low likelihood that her hyponatremia is due to depleted sodium stores.  Follow-up: Return to clinic in 2 weeks.  [name], MD 08/28/2017 10:02 AM

## 2017-08-28 ENCOUNTER — Encounter: Payer: Self-pay | Admitting: Internal Medicine

## 2017-08-28 ENCOUNTER — Ambulatory Visit: Payer: Federal, State, Local not specified - PPO | Admitting: Internal Medicine

## 2017-08-28 VITALS — BP 126/66 | HR 63 | Ht 63.0 in | Wt 126.4 lb

## 2017-08-28 DIAGNOSIS — I5022 Chronic systolic (congestive) heart failure: Secondary | ICD-10-CM

## 2017-08-28 DIAGNOSIS — I25118 Atherosclerotic heart disease of native coronary artery with other forms of angina pectoris: Secondary | ICD-10-CM | POA: Diagnosis not present

## 2017-08-28 DIAGNOSIS — E871 Hypo-osmolality and hyponatremia: Secondary | ICD-10-CM

## 2017-08-28 DIAGNOSIS — E785 Hyperlipidemia, unspecified: Secondary | ICD-10-CM | POA: Diagnosis not present

## 2017-08-28 DIAGNOSIS — I10 Essential (primary) hypertension: Secondary | ICD-10-CM | POA: Diagnosis not present

## 2017-08-28 DIAGNOSIS — I255 Ischemic cardiomyopathy: Secondary | ICD-10-CM | POA: Diagnosis not present

## 2017-08-28 DIAGNOSIS — R002 Palpitations: Secondary | ICD-10-CM | POA: Insufficient documentation

## 2017-08-28 MED ORDER — CLOPIDOGREL BISULFATE 75 MG PO TABS
75.0000 mg | ORAL_TABLET | Freq: Every day | ORAL | 3 refills | Status: DC
Start: 2017-08-28 — End: 2018-08-16

## 2017-08-28 NOTE — Patient Instructions (Signed)
Medication Instructions:  Your physician recommends that you continue on your current medications as directed. Please refer to the Current Medication list given to you today.  Do not take salt tablets  -- If you need a refill on your cardiac medications before your next appointment, please call your pharmacy. --  Labwork: None ordered  Testing/Procedures: None ordered  Follow-Up: Your physician wants you to follow-up with APP in 2 weeks   Thank you for choosing [facility]!!    Any Other Special Instructions Will Be Listed Below (If Applicable).

## 2017-09-06 ENCOUNTER — Encounter: Payer: Self-pay | Admitting: Physician Assistant

## 2017-09-13 ENCOUNTER — Ambulatory Visit: Payer: Federal, State, Local not specified - PPO | Admitting: Physician Assistant

## 2017-09-13 ENCOUNTER — Other Ambulatory Visit: Payer: Self-pay | Admitting: Physician Assistant

## 2017-09-13 ENCOUNTER — Telehealth: Payer: Self-pay | Admitting: Nurse Practitioner

## 2017-09-13 ENCOUNTER — Encounter: Payer: Self-pay | Admitting: Physician Assistant

## 2017-09-13 VITALS — BP 136/60 | HR 64 | Ht 63.0 in | Wt 126.0 lb

## 2017-09-13 DIAGNOSIS — R002 Palpitations: Secondary | ICD-10-CM

## 2017-09-13 DIAGNOSIS — R0602 Shortness of breath: Secondary | ICD-10-CM

## 2017-09-13 DIAGNOSIS — I10 Essential (primary) hypertension: Secondary | ICD-10-CM | POA: Diagnosis not present

## 2017-09-13 DIAGNOSIS — I5022 Chronic systolic (congestive) heart failure: Secondary | ICD-10-CM | POA: Diagnosis not present

## 2017-09-13 DIAGNOSIS — I25118 Atherosclerotic heart disease of native coronary artery with other forms of angina pectoris: Secondary | ICD-10-CM | POA: Diagnosis not present

## 2017-09-13 LAB — PRO B NATRIURETIC PEPTIDE: NT-Pro BNP: 774 pg/mL — ABNORMAL HIGH (ref 0–301)

## 2017-09-13 MED ORDER — FUROSEMIDE 20 MG PO TABS: 20.0000 mg | ORAL_TABLET | ORAL | 3 refills | Status: DC

## 2017-09-13 MED ORDER — FUROSEMIDE 20 MG PO TABS: ORAL_TABLET | ORAL | 3 refills | Status: DC

## 2017-09-13 NOTE — Telephone Encounter (Signed)
-----   Message from Liliane Shi, [place] sent at 09/13/2017  4:56 PM EST ----- Please call patient. BNP slightly elevated.  She would like feel better with a small dose of Lasix.  PLAN:  1.  Recommend she take Lasix 20 mg every Monday, Wednesday, Friday 2.  BMET 2 weeks 3.  Weigh daily, call if weight increases 3 pounds or more in 1 day or 5 pounds in 1 week 4.  Follow-up as planned Richardson Dopp, PA-C 09/13/2017 4:55 PM

## 2017-09-13 NOTE — Patient Instructions (Addendum)
Medication Instructions:  No changes.  Labwork: Today - BNP  Testing/Procedures: None If the palpitations get worse, call to schedule a heart monitor  Follow-Up: Dr. Harrell Gave End or Richardson Dopp, PA-C in 3 months.  Any Other Special Instructions Will Be Listed Below (If Applicable).  If you need a refill on your cardiac medications before your next appointment, please call your pharmacy.    If you are interested, you can look into the [place] by [employer].   This device is purchased by you and it connects to an application you download to your smart phone.  It can detect abnormal heart rhythms and alert you to contact your doctor for further evaluation. The web site is:  https://www.alivecor.com .in

## 2017-09-13 NOTE — Progress Notes (Signed)
Cardiology Office Note:    Date:  09/13/2017   ID:  Pam [name], DOB [birth year], MRN [id number]  PCP:  Loraine Leriche., MD  Cardiologist:  [name], MD   Referring MD: Loraine Leriche.,*   Chief Complaint  Patient presents with  . Follow-up    Shortness of breath    History of Present Illness:    Pam [name] is a 67 y.o. female with a hx of coronary artery disease with chronic total occlusion of the mid LAD, status post DES to the proximal LCx in January 7169, systolic heart failure secondary to ischemic cardiomyopathy with an EF of 38 by cardiac MRI, hyperlipidemia.  Last seen by Dr. Saunders Revel 08/20/17.  She was somewhat volume overloaded secondary to increased sodium.  Consideration was given towards adding furosemide.  However, the patient opted to discontinue increased sodium in follow-up today.  The patient also complained of palpitations.  Pam [name] returns for follow-up.  She still feels somewhat bloated and does get short of breath at times.  She sleeps on an incline chronically without significant change.  She denies PND.  She has occasional chest discomfort relieved by nitroglycerin.  This is a chronic symptom without change.  Actually, she feels that it is becoming less frequent.  She denies syncope.  She continues to have occasional palpitations.  These seem to be quite infrequent.  Prior CV studies:   The following studies were reviewed today:  Cardiac catheterization 08/29/16 LAD proximal 99, mid 100; first septal ostial 90 LCx ostial 30, proximal 80, OM1 ostial 80 PCI: Orbital atherectomy and IVUS guided PCI with 3 x 32 mm Synergy DES to the proximal LCx and OM1  Cardiac MRI 07/11/16 IMPRESSION: 1. Normal size and thickness of the left ventricle. Systolic function is moderately decreased (LVEF = 38%). 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 There is akinesis of the apical lateral, mid anterior,  anteroseptal walls. The LAD territory is non-viable, RCA and LCX territory is viable. 2. Normal right ventricular size, thickness and systolic function (RVEF = 54%) with no regional wall motion abnormalities. 3.  Normal biatrial size. 4.  Mild mitral and mild to moderate tricuspid regurgitation. 5. Normal size of the aortic root, ascending aorta and pulmonary artery. 6.  Normal pericardium, trivial pericardial effusion.  Cardiac catheterization 07/08/16 CTO of proximal/mid LAD at origin of first septal branch with left to left and right-to-left collaterals LCx/OM1 proximal 80 EF 30 with anterior/apical/apical inferior dyskinesis  Echo 07/08/16 EF 35-40, anteroseptal/apical anterior/septal akinesis; anterior and anterolateral hypokinesis, grade 1 diastolic dysfunction, trivial MR  Past Medical History:  Diagnosis Date  . Abnormal EKG   . Alcoholic intoxication without complication ([place])   . Cardiomyopathy, ischemic   . Chest pain 08/29/2016  . Chronic systolic heart failure ([place]) 08/29/2016  . Coronary artery disease of native artery of native heart with stable angina pectoris ([place])   . Facial laceration   . Fall 07/07/2016  . Hyperlipidemia   . Hypertension 03/03/2017  . Hyponatremia   . Hypothyroidism 07/08/2016  . NSTEMI (non-ST elevated myocardial infarction) ([place])   . Syncope   . Thyroid disease   . Unstable angina ([place]) 08/29/2016    Past Surgical History:  Procedure Laterality Date  . ABDOMINAL HYSTERECTOMY    . CARDIAC CATHETERIZATION N/A 07/08/2016   Procedure: Left Heart Cath and Coronary Angiography;  Surgeon: [name], MD;  Location: [facility];  Service: Cardiovascular;  Laterality:  N/A;  . CARDIAC CATHETERIZATION N/A 08/29/2016   Procedure: Coronary Atherectomy;  Surgeon: [name], MD;  Location: [facility];  Service: Cardiovascular;  Laterality: N/A;  . CARDIAC CATHETERIZATION N/A 08/29/2016   Procedure: Coronary Stent Intervention;   Surgeon: [name], MD;  Location: [facility];  Service: Cardiovascular;  Laterality: N/A;  . CATARACT EXTRACTION, BILATERAL      Current Medications: Current Meds  Medication Sig  . acetaminophen (TYLENOL) 650 MG CR tablet Take 650 mg by mouth every 8 (eight) hours as needed for pain (headache).  Marland Kitchen aspirin 81 MG chewable tablet Chew 1 tablet (81 mg total) by mouth daily.  Marland Kitchen atorvastatin (LIPITOR) 80 MG tablet Take 1 tablet (80 mg total) by mouth daily.  . carvedilol (COREG) 6.25 MG tablet Take 1 tablet (6.25 mg total) by mouth 2 (two) times daily.  . clopidogrel (PLAVIX) 75 MG tablet Take 1 tablet (75 mg total) by mouth daily with breakfast.  . levothyroxine (SYNTHROID, LEVOTHROID) 25 MCG tablet Take 37.5 mcg by mouth daily before breakfast.  . losartan (COZAAR) 50 MG tablet Take 1 tablet (50 mg total) by mouth daily.  . nitroGLYCERIN (NITROSTAT) 0.4 MG SL tablet Place 1 tablet (0.4 mg total) under the tongue every 5 (five) minutes as needed for chest pain.  . ranolazine (RANEXA) 1000 MG SR tablet Take 1 tablet (1,000 mg total) by mouth 2 (two) times daily.     Allergies:   Lisinopril and Spironolactone   Social History   Tobacco Use  . Smoking status: Former Smoker    Packs/day: 1.00    Years: 50.00    Pack years: 50.00    Types: Cigarettes    Last attempt to quit: 07/07/2016    Years since quitting: 1.1  . Smokeless tobacco: Never Used  Substance Use Topics  . Alcohol use: No  . Drug use: No     Family Hx: The patient's family history includes Kidney failure in her mother.  ROS:   Please see the history of present illness.    ROS All other systems reviewed and are negative.   EKGs/Labs/Other Test Reviewed:    EKG:  EKG is not  ordered today.   Recent Labs: 08/21/2017: ALT 22; BUN 12; Creatinine, Ser 0.92; Potassium 4.7; Sodium 135 09/13/2017: NT-Pro BNP 774   Recent Lipid Panel Lab Results  Component Value Date/Time   CHOL 179 08/21/2017 07:46 AM     TRIG 50 08/21/2017 07:46 AM   HDL 95 08/21/2017 07:46 AM   CHOLHDL 1.9 08/21/2017 07:46 AM   CHOLHDL 2.0 07/07/2016 07:14 PM   LDLCALC 74 08/21/2017 07:46 AM    Physical Exam:    VS:  BP 136/60   Pulse 64   Ht 5\' 3"  (1.6 m)   Wt 126 lb (57.2 kg)   SpO2 93%   BMI 22.32 kg/m     Wt Readings from Last 3 Encounters:  09/13/17 126 lb (57.2 kg)  08/28/17 126 lb 6.4 oz (57.3 kg)  05/22/17 122 lb 12.8 oz (55.7 kg)     Physical Exam  Constitutional: She is oriented to person, place, and time. She appears well-developed and well-nourished. No distress.  HENT:  Head: Normocephalic and atraumatic.  Eyes: No scleral icterus.  Neck: No JVD present.  Cardiovascular: Normal rate and regular rhythm. Exam reveals no gallop.  No murmur heard. Pulmonary/Chest: Effort normal. She has no rales.  Abdominal: Soft.  Musculoskeletal: She exhibits edema (very trace bilat LE edema).  Neurological: She is alert and oriented to person, place, and time.  Skin: Skin is warm and dry.    ASSESSMENT & PLAN:    1.  Shortness of breath Her weight remains elevated.  She continues to have some feelings of being bloated and short of breath.  She does have substrate for volume excess.  I will obtain a BNP.  If this is significantly elevated, I will place her on Lasix.  2.  Chronic systolic heart failure (HCC) EF 38 by cardiac MRI in 2017.  Obtain BNP as noted.  Continue beta-blocker, ARB.  3.  Coronary artery disease of native artery of native heart with stable angina pectoris [facility]) She is status post drug-eluting stent to the proximal LCx in June 2018.  She has a known chronically occluded LAD.  She has a stable anginal pattern.  Continue aspirin, Plavix, statin, beta-blocker, ranolazine.  4.  Palpitations She is still not interested in pursuing a Holter or event monitor.  I did give her the name of the Kardia mobile device if she is interested in obtaining this.  She knows to contact us if she has more  frequent palpitations to arrange an event monitor.  5.  Essential hypertension The patient's blood pressure is controlled on her current regimen.  Continue current therapy.    Dispo:  Return in about 3 months (around 12/11/2017) for Routine Follow Up, w/ Dr. Saunders Revel, or Richardson Dopp, PA-C.   Medication Adjustments/Labs and Tests Ordered: Current medicines are reviewed at length with the patient today.  Concerns regarding medicines are outlined above.  Tests Ordered: Orders Placed This Encounter  Procedures  . Pro b natriuretic peptide (BNP)   Medication Changes: No orders of the defined types were placed in this encounter.   Signed, Richardson Dopp, PA-C  09/13/2017 5:48 PM    [facility] [address], [place], [place]  [address] Phone: [phone]; Fax: [phone]

## 2017-09-13 NOTE — Telephone Encounter (Signed)
Lab results and plan of care reviewed with patient. She wrote down the instructions and repeated them back to me. She is scheduled for lab appointment on 3/1. I advised her to call back sooner with questions or concerns prior to follow-up. She thanked me for the call.

## 2017-09-29 ENCOUNTER — Other Ambulatory Visit: Payer: Federal, State, Local not specified - PPO | Admitting: *Deleted

## 2017-09-29 ENCOUNTER — Telehealth: Payer: Self-pay | Admitting: *Deleted

## 2017-09-29 DIAGNOSIS — I5022 Chronic systolic (congestive) heart failure: Secondary | ICD-10-CM

## 2017-09-29 LAB — BASIC METABOLIC PANEL
BUN / CREAT RATIO: 12 (ref 12–28)
BUN: 11 mg/dL (ref 8–27)
CO2: 25 mmol/L (ref 20–29)
CREATININE: 0.93 mg/dL (ref 0.57–1.00)
Calcium: 9.6 mg/dL (ref 8.7–10.3)
Chloride: 94 mmol/L — ABNORMAL LOW (ref 96–106)
GFR calc non Af Amer: 64 mL/min/{1.73_m2} (ref >59)
GFR, EST AFRICAN AMERICAN: 74 mL/min/{1.73_m2} (ref >59)
GLUCOSE: 92 mg/dL (ref 65–99)
Potassium: 4.6 mmol/L (ref 3.5–5.2)
Sodium: 132 mmol/L — ABNORMAL LOW (ref 134–144)

## 2017-09-29 NOTE — Telephone Encounter (Signed)
Pt has been notified of lab results by phone with verbal understanding. Pt advised to continue on her current Tx plan. Pt thanked me for my call.

## 2017-09-29 NOTE — Telephone Encounter (Signed)
-----   Message from Liliane Shi, [place] sent at 09/29/2017  4:44 PM EST ----- Renal function, potassium normal. Continue current medications and follow up as planned.  Richardson Dopp, PA-C    09/29/2017 4:43 PM

## 2017-10-13 IMAGING — MR MR CARD MORPHOLOGY WO/W CM
9 of 10 series · 15 of 16 positions shown · IV contrast (25    Multihance)
Comparison: none

CLINICAL DATA: 64-year-old female with ischemic cardiomyopathy.
Evaluate for viability.

EXAM:
CARDIAC MRI
TECHNIQUE: The patient was scanned on a 1.5 Tesla GE magnet. A dedicated
cardiac coil was used. Functional imaging was done using Fiesta
sequences. [DATE], and 4 chamber views were done to assess for RWMA's.
Modified Pullen rule using a short axis stack was used to
calculate an ejection fraction on a dedicated work station using
Circle software. The patient received 24 cc of Multihance. After 10
minutes inversion recovery sequences were used to assess for
infiltration and scar tissue.
CONTRAST:  24 cc  of Multihance

[Series 3: bSSFP · sagittal · 8.0mm · 1.29mm/px · 1 of 16 slices shown (1 of 4)]
[im 1/16]
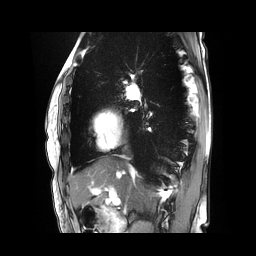

[Series 4: bSSFP · axial · 8.0mm · 1.25mm/px · 1 of 20 slices shown (2 of 4)]
[im 1/20]
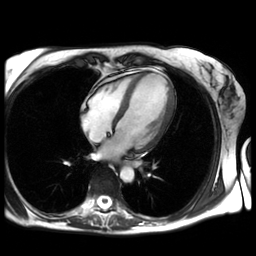

[Series 5: bSSFP · coronal · 8.0mm · 1.41mm/px · 7 of 320 slices shown (3 of 4)]
[im 1/320]
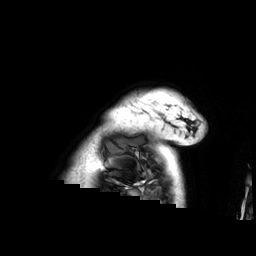
[im 54/320]
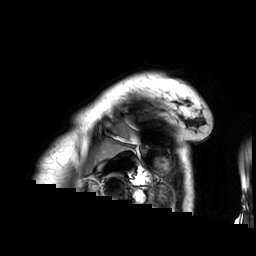
[im 107/320]
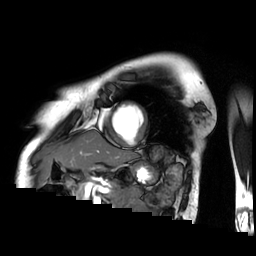
[im 160/320]
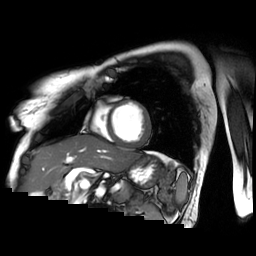
[im 213/320]
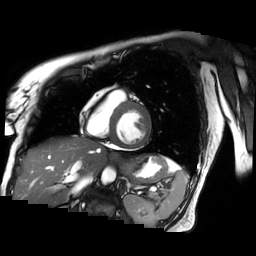
[im 266/320]
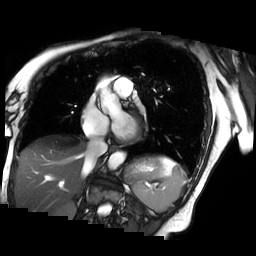
[im 320/320]
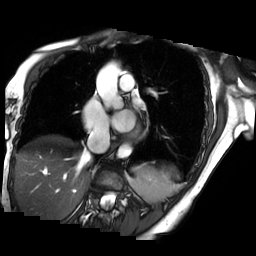

[Series 6: bSSFP · oblique · 8.0mm · 1.33mm/px · 1 of 60 slices shown (4 of 4)]
[im 1/60]
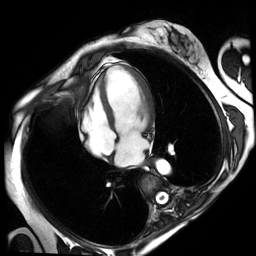

[Series 7: cine ir · oblique · 8.0mm · 1.37mm/px · 1 of 30 slices shown]
[im 1/30]
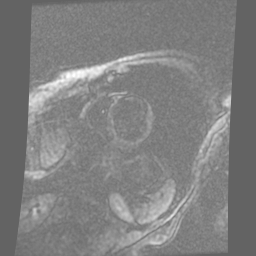

[Series 13: delayed ir prep · coronal · 8.0mm · 1.41mm/px · 1 of 7 slices shown (1 of 2)]
[im 1/7]
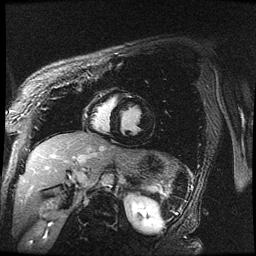

[Series 14: delayed ir prep · coronal · 8.0mm · 1.41mm/px · 1 of 11 slices shown (2 of 2)]
[im 1/11]
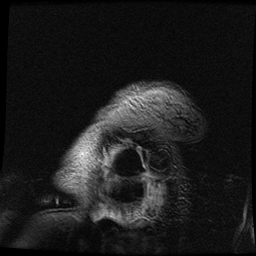

[Series 15: rad delayed ir · oblique · 8.0mm · 1.33mm/px · 1 of 3 slices shown (1 of 2)]
[im 1/3]
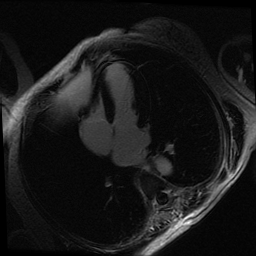

[Series 16: rad delayed ir · oblique · 8.0mm · 1.33mm/px · 1 of 3 slices shown (2 of 2)]
[im 1/3]
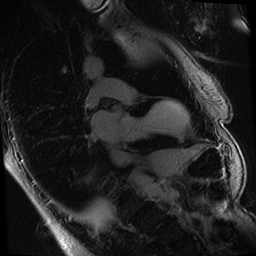

[15 of 16 positions shown; findings below may reference images not displayed]

FINDINGS: 1. Normal size and thickness of the left ventricle. Systolic
function is moderately decreased (LVEF = 38%).

There is aneurysmal dilatation of the apical septal, anterior walls
and of the true apex. There is akinesis of the apical lateral, mid
anterior, anteroseptal walls.

There is late gadolinium enhancement in the following segments:

True apex:  75-100% (dismal chance of recovery if revascularized)

Apical anterior: 75-100% (dismal chance of recovery if
revascularized)

Apical septal: 75-100% (dismal chance of recovery if revascularized)

Apical lateral: 50-75% (poor chance of recovery if revascularized)

Mid anterior: 75-100% (dismal chance of recovery if revascularized)

Mid anteroseptal: 50-75% (poor chance of recovery if revascularized)

LVEDD:  52 mm

LVESD:  40 mm

LVEDV:  156 ml

LVESV:  96 ml

SV:  60 ml

Myocardial mass:  122 g

2. Normal right ventricular size, thickness and systolic function
(RVEF = 54%) with no regional wall motion abnormalities.

3.  Normal biatrial size.

4.  Mild mitral and mild to moderate tricuspid regurgitation.

5. Normal size of the aortic root, ascending aorta and pulmonary
artery.

6.  Normal pericardium, trivial pericardial effusion.
IMPRESSION: 1. Normal size and thickness of the left ventricle. Systolic
function is moderately decreased (LVEF = 38%).

There is aneurysmal dilatation of the apical septal, anterior walls
and of the true apex. There is akinesis of the apical lateral, mid
anterior, anteroseptal walls.

The LAD territory is non-viable, RCA and LCX territory is viable.

2. Normal right ventricular size, thickness and systolic function
(RVEF = 54%) with no regional wall motion abnormalities.

3.  Normal biatrial size.

4.  Mild mitral and mild to moderate tricuspid regurgitation.

5. Normal size of the aortic root, ascending aorta and pulmonary
artery.

6.  Normal pericardium, trivial pericardial effusion.

Labelle Salha Brack Tiger

## 2017-12-04 ENCOUNTER — Ambulatory Visit: Payer: Federal, State, Local not specified - PPO | Admitting: Internal Medicine

## 2017-12-04 ENCOUNTER — Encounter: Payer: Self-pay | Admitting: Internal Medicine

## 2017-12-04 VITALS — BP 104/62 | HR 62 | Ht 63.0 in | Wt 123.2 lb

## 2017-12-04 DIAGNOSIS — I5022 Chronic systolic (congestive) heart failure: Secondary | ICD-10-CM

## 2017-12-04 DIAGNOSIS — E785 Hyperlipidemia, unspecified: Secondary | ICD-10-CM

## 2017-12-04 DIAGNOSIS — I25118 Atherosclerotic heart disease of native coronary artery with other forms of angina pectoris: Secondary | ICD-10-CM

## 2017-12-04 DIAGNOSIS — I255 Ischemic cardiomyopathy: Secondary | ICD-10-CM

## 2017-12-04 MED ORDER — EZETIMIBE 10 MG PO TABS: 10.0000 mg | ORAL_TABLET | Freq: Every day | ORAL | 3 refills | Status: DC

## 2017-12-04 MED ORDER — NITROGLYCERIN 0.4 MG SL SUBL: 0.4000 mg | SUBLINGUAL_TABLET | SUBLINGUAL | 3 refills | Status: DC | PRN

## 2017-12-04 NOTE — Progress Notes (Addendum)
Follow-up Outpatient Visit Date: 12/04/2017  Primary Care Provider: Loraine Leriche., MD [address] [place] [address]  Chief Complaint: Follow-up coronary artery disease and heart failure  HPI:  Ms. Hottle is a 67 y.o. year-old female with history of coronary artery diseasewith chronic total occlusion of the mid LAD and PCI to the LCx in 08/4479, chronic systolic heart failure due to ischemic cardiomyopathy, hyperlipidemia, and prior tobacco use, who presents for follow-up of coronary artery and chronic heart failure.  I last saw Ms. Gaba in January, at which time she reported increased edema and shortness of breath after having been started on salt tablets by her PCP for hyponatremia.  We agreed to stop salt tablets and defer adding a standing diuretic.  She returned to see Richardson Dopp, PA, 2 weeks later and noted continued bloating and intermittent shortness of breath.  Pro-BNP was found to be elevated, prompting initiation of furosemide 20 mg on Mondays, Wednesdays, and Fridays.  Today, Ms. Carrara reports she is feeling about the same or slightly better than at her last visit.  She still has exertional dyspnea when walking quickly or for extended periods.  She has not had any significant leg edema and remains on furosemide 20 mg 3 days a week.  She has rare episodes of chest pain, most often when lying down at night.  She occasionally will use sublingual nitroglycerin.  She has not had any falls but still notes some lightheadedness when she stands up quickly or turns her head too fast.  She denies palpitations.  She has stable 3 pillow orthopnea.  She remains on aspirin and clopidogrel without significant bleeding, though she notes easy bruising.  --------------------------------------------------------------------------------------------------  Cardiovascular History & Procedures: Cardiovascular Problems:  Coronary artery disease with NSTEMI and chronic total occlusion of the  proximal/mid LAD  Ischemic cardiomyopathy  Risk Factors:  Known coronary artery disease, hyperlipidemia, tobacco use, age  Cath/PCI:  PCI (08/29/16): Significant left coronary artery disease, including CTO of the mid LAD and 80% calcified proximal LCx stenosis extending into large OM1 branch. Successful IVUS-guided PCI utilizing orbital atherectomy with placement of a Synergy 3.0 x 32 mm drug-eluting stent. Normal left ventricular filling pressure. Significant right radial artery spasm precluded guide catheter advancement. Therefore, femoral access was used for the intervention.  LHC (07/08/16): Significant two-vessel coronary artery disease including totally calcified chronic total occlusion of the proximal/mid LAD and calcified 80% stenosis of proximal LCx/OM one. LVEF 30% with anterior, apical, and apical inferior disc low normal LVEDP (4 mmHg).  CV Surgery:  None  EP Procedures and Devices:  None  Non-Invasive Evaluation(s):  Right groin ultrasound (09/16/16): Patent arteries and veins without pseudoaneurysm, AV fistula, or obstruction.  Cardiac MRI (07/11/16): Normal LV size and wall thickness with moderately reduced contraction (EF 38%). Akinesis/dyskinesis of the mid and apical anterior/anteroseptal and apical segments. LAD territory is nonviable. RCA and LCx territories are viable. Normal RV size and function. Normal biatrial size.  Transthoracic echocardiogram (07/08/16): Normal LV size and wall thickness with LVEF of 35-40%. Anterior and apical wall motion abnormality present. Grade 1 diastolic dysfunction. Normal RV size and function. No significant valvular abnormalities.   Recent CV Pertinent Labs: Lab Results  Component Value Date   CHOL 179 08/21/2017   HDL 95 08/21/2017   LDLCALC 74 08/21/2017   TRIG 50 08/21/2017   CHOLHDL 1.9 08/21/2017   CHOLHDL 2.0 07/07/2016   INR 1.1 08/25/2016   K 4.6 09/29/2017   BUN 11 09/29/2017  CREATININE 0.93 09/29/2017     Past medical and surgical history were reviewed and updated in EPIC.  Current Meds  Medication Sig  . acetaminophen (TYLENOL) 650 MG CR tablet Take 650 mg by mouth every 8 (eight) hours as needed for pain (headache).  Marland Kitchen aspirin 81 MG chewable tablet Chew 1 tablet (81 mg total) by mouth daily.  Marland Kitchen atorvastatin (LIPITOR) 80 MG tablet Take 80 mg by mouth daily.  . carvedilol (COREG) 6.25 MG tablet Take 1 tablet (6.25 mg total) by mouth 2 (two) times daily.  . clopidogrel (PLAVIX) 75 MG tablet Take 1 tablet (75 mg total) by mouth daily with breakfast.  . furosemide (LASIX) 20 MG tablet Take 1 pill every Monday, Wednesday, and Friday  . levothyroxine (SYNTHROID, LEVOTHROID) 25 MCG tablet Take 37.5 mcg by mouth daily before breakfast.  . losartan (COZAAR) 50 MG tablet Take 1 tablet (50 mg total) by mouth daily.  . nitroGLYCERIN (NITROSTAT) 0.4 MG SL tablet Place 0.4 mg under the tongue every 5 (five) minutes x 3 doses as needed for chest pain.  . ranolazine (RANEXA) 1000 MG SR tablet Take 1 tablet (1,000 mg total) by mouth 2 (two) times daily.    Allergies: Lisinopril and Spironolactone  Social History   Tobacco Use  . Smoking status: Former Smoker    Packs/day: 1.00    Years: 50.00    Pack years: 50.00    Types: Cigarettes    Last attempt to quit: 07/07/2016    Years since quitting: 1.4  . Smokeless tobacco: Never Used  Substance Use Topics  . Alcohol use: No  . Drug use: No    Family History  Problem Relation Age of Onset  . Kidney failure Mother    Review of Systems: Review of Systems  Constitutional: Negative.   HENT: Negative.   Eyes: Negative.   Respiratory: Positive for shortness of breath (with activity).   Cardiovascular: Positive for chest pain and orthopnea.  Gastrointestinal: Positive for constipation.  Genitourinary: Negative.   Musculoskeletal: Negative.   Skin: Negative.   Neurological: Positive for headaches.  Endo/Heme/Allergies: Bruises/bleeds easily.   Psychiatric/Behavioral: Negative.    --------------------------------------------------------------------------------------------------  Physical Exam: BP 104/62   Pulse 62   Ht 5\' 3"  (1.6 m)   Wt 123 lb 3.2 oz (55.9 kg)   BMI 21.82 kg/m   General:  Thin woman, seated comfortably in the exam room. HEENT: No conjunctival pallor or scleral icterus. Moist mucous membranes.  OP clear. Neck: Supple without lymphadenopathy, thyromegaly, JVD, or HJR. Lungs: Normal work of breathing. Clear to auscultation bilaterally without wheezes or crackles. Heart: Regular rate and rhythm without murmurs, rubs, or gallops. Non-displaced PMI. Abd: Bowel sounds present. Soft, NT/ND without hepatosplenomegaly Ext: No lower extremity edema. Radial, PT, and DP pulses are 2+ bilaterally. Skin: Warm and dry without rash.  EKG: NSR with anterior T-wave inversions and non-specific ST-segment changes, similar to prior tracings.  Lab Results  Component Value Date   WBC 7.3 08/30/2016   HGB 11.5 (L) 08/30/2016   HCT 33.8 (L) 08/30/2016   MCV 87.8 08/30/2016   PLT 219 08/30/2016    Lab Results  Component Value Date   NA 132 (L) 09/29/2017   K 4.6 09/29/2017   CL 94 (L) 09/29/2017   CO2 25 09/29/2017   BUN 11 09/29/2017   CREATININE 0.93 09/29/2017   GLUCOSE 92 09/29/2017   ALT 22 08/21/2017    Lab Results  Component Value Date   CHOL 179 08/21/2017  HDL 95 08/21/2017   LDLCALC 74 08/21/2017   TRIG 50 08/21/2017   CHOLHDL 1.9 08/21/2017    --------------------------------------------------------------------------------------------------  ASSESSMENT AND PLAN: Coronary artery disease with stable angina Ms. Wirz continues to have occasional episodes of chest pain, most often when lying down at night.  She is tolerating ranolazine well and noticed significant improvement with addition of this medication.  We will continue her current regimen of ranolazine 1000 mg twice daily and carvedilol 6.25  mg twice daily.  We have agreed to continue with indefinite dual antiplatelet therapy as well.  Chronic systolic heart failure secondary to ischemic cardiomyopathy Ms. Halabi appears euvolemic on exam today.  Her weight is down 3 pounds since February.  She still has NYHA class II symptoms.  I think it is reasonable to continue with furosemide 20 mg on Mondays, Wednesdays, and Fridays.  I am hesitant to uptitrate carvedilol and losartan today due to borderline low blood pressure and heart rate.  Hyperlipidemia Repeat labs last month through [facility] revealed an LDL of 90 despite escalation of atorvastatin to 80 mg daily.  Given a goal LDL of less than 70, we have agreed to start ezetimibe 10 mg daily and repeat a lipid panel and ALT in 3 months.  If her LDL remains suboptimally controlled, we will need to refer Ms. Faulcon to the lipid clinic to discuss PCSK9 inhibitor therapy.  Follow-up: Return to clinic in 3 months.  [name], MD 12/04/2017 9:35 AM\

## 2017-12-04 NOTE — Patient Instructions (Addendum)
Medication Instructions:  START Zetia 10mg  daily by mouth   -- If you need a refill on your cardiac medications before your next appointment, please call your pharmacy. --  Labwork: 3 MONTHS  Lipid Profile ALT Testing/Procedures: None ordered  Follow-Up: Your physician wants you to follow-up in: After Labs Drawn with Dr. Saunders Revel.    Thank you for choosing [facility]!!    Any Other Special Instructions Will Be Listed Below (If Applicable).   Ezetimibe Tablets What is this medicine? EZETIMIBE (ez ET i mibe) blocks the absorption of cholesterol from the stomach. It can help lower blood cholesterol for patients who are at risk of getting heart disease or a stroke. It is only for patients whose cholesterol level is not controlled by diet. This medicine may be used for other purposes; ask your health care provider or pharmacist if you have questions. COMMON BRAND NAME(S): Zetia What should I tell my health care provider before I take this medicine? They need to know if you have any of these conditions: -liver disease -an unusual or allergic reaction to ezetimibe, medicines, foods, dyes, or preservatives -pregnant or trying to get pregnant -breast-feeding How should I use this medicine? Take this medicine by mouth with a glass of water. Follow the directions on the prescription label. This medicine can be taken with or without food. Take your doses at regular intervals. Do not take your medicine more often than directed. Talk to your pediatrician regarding the use of this medicine in children. Special care may be needed. Overdosage: If you think you have taken too much of this medicine contact a poison control center or emergency room at once. NOTE: This medicine is only for you. Do not share this medicine with others. What if I miss a dose? If you miss a dose, take it as soon as you can. If it is almost time for your next dose, take only that dose. Do not take double or extra  doses. What may interact with this medicine? Do not take this medicine with any of the following medications: -fenofibrate -gemfibrozil This medicine may also interact with the following medications: -antacids -cyclosporine -herbal medicines like red yeast rice -other medicines to lower cholesterol or triglycerides This list may not describe all possible interactions. Give your health care provider a list of all the medicines, herbs, non-prescription drugs, or dietary supplements you use. Also tell them if you smoke, drink alcohol, or use illegal drugs. Some items may interact with your medicine. What should I watch for while using this medicine? Visit your doctor or health care professional for regular checks on your progress. You will need to have your cholesterol levels checked. If you are also taking some other cholesterol medicines, you will also need to have tests to make sure your liver is working properly. Tell your doctor or health care professional if you get any unexplained muscle pain, tenderness, or weakness, especially if you also have a fever and tiredness. You need to follow a low-cholesterol, low-fat diet while you are taking this medicine. This will decrease your risk of getting heart and blood vessel disease. Exercising and avoiding alcohol and smoking can also help. Ask your doctor or dietician for advice. What side effects may I notice from receiving this medicine? Side effects that you should report to your doctor or health care professional as soon as possible: -allergic reactions like skin rash, itching or hives, swelling of the face, lips, or tongue -dark yellow or brown urine -unusually weak or  tired -yellowing of the skin or eyes Side effects that usually do not require medical attention (report to your doctor or health care professional if they continue or are bothersome): -diarrhea -dizziness -headache -stomach upset or pain This list may not describe all  possible side effects. Call your doctor for medical advice about side effects. You may report side effects to FDA at 1-800-FDA-1088. Where should I keep my medicine? Keep out of the reach of children. Store at room temperature between 15 and 30 degrees C (59 and 86 degrees F). Protect from moisture. Keep container tightly closed. Throw away any unused medicine after the expiration date. NOTE: This sheet is a summary. It may not cover all possible information. If you have questions about this medicine, talk to your doctor, pharmacist, or health care provider.  2018 Elsevier/Gold Standard (2012-01-23 15:39:09)

## 2017-12-16 ENCOUNTER — Other Ambulatory Visit: Payer: Self-pay | Admitting: Internal Medicine

## 2017-12-16 DIAGNOSIS — E785 Hyperlipidemia, unspecified: Secondary | ICD-10-CM

## 2017-12-16 DIAGNOSIS — I25118 Atherosclerotic heart disease of native coronary artery with other forms of angina pectoris: Secondary | ICD-10-CM

## 2017-12-18 NOTE — Telephone Encounter (Signed)
Please review for refill. Thanks!  

## 2017-12-20 ENCOUNTER — Other Ambulatory Visit: Payer: Self-pay | Admitting: Internal Medicine

## 2017-12-20 MED ORDER — NITROGLYCERIN 0.4 MG SL SUBL: 0.4000 mg | SUBLINGUAL_TABLET | SUBLINGUAL | 2 refills | Status: DC | PRN

## 2018-01-13 ENCOUNTER — Other Ambulatory Visit: Payer: Self-pay | Admitting: Internal Medicine

## 2018-01-13 DIAGNOSIS — I25119 Atherosclerotic heart disease of native coronary artery with unspecified angina pectoris: Secondary | ICD-10-CM

## 2018-01-13 DIAGNOSIS — I5022 Chronic systolic (congestive) heart failure: Secondary | ICD-10-CM

## 2018-01-15 NOTE — Telephone Encounter (Signed)
Refill Request.  

## 2018-02-14 ENCOUNTER — Other Ambulatory Visit: Payer: Self-pay | Admitting: Internal Medicine

## 2018-02-14 DIAGNOSIS — I5022 Chronic systolic (congestive) heart failure: Secondary | ICD-10-CM

## 2018-02-14 NOTE — Telephone Encounter (Signed)
Refill Request.  

## 2018-03-01 ENCOUNTER — Other Ambulatory Visit: Payer: Federal, State, Local not specified - PPO | Admitting: *Deleted

## 2018-03-01 DIAGNOSIS — E785 Hyperlipidemia, unspecified: Secondary | ICD-10-CM

## 2018-03-01 LAB — LIPID PANEL
CHOLESTEROL TOTAL: 153 mg/dL (ref 100–199)
Chol/HDL Ratio: 1.6 ratio (ref 0.0–4.4)
HDL: 93 mg/dL (ref >39)
LDL Calculated: 52 mg/dL (ref 0–99)
Triglycerides: 41 mg/dL (ref 0–149)
VLDL CHOLESTEROL CAL: 8 mg/dL (ref 5–40)

## 2018-03-01 LAB — ALT: ALT: 15 IU/L (ref 0–32)

## 2018-03-02 ENCOUNTER — Telehealth: Payer: Self-pay

## 2018-03-02 NOTE — Telephone Encounter (Signed)
Notes recorded by Frederik Schmidt, RN on 03/02/2018 at 10:13 AM EDT Informed patient of results/recommendations. She verbalized understanding.

## 2018-03-02 NOTE — Telephone Encounter (Signed)
-----   Message from [name], MD sent at 03/02/2018 10:07 AM EDT ----- Please let Ms. Boeh know that her cholesterol is now under good control with an LDL of 52.  Her liver function is also normal.  I recommend that she continue her current medications.

## 2018-03-05 ENCOUNTER — Encounter: Payer: Self-pay | Admitting: Internal Medicine

## 2018-03-05 ENCOUNTER — Ambulatory Visit: Payer: Federal, State, Local not specified - PPO | Admitting: Internal Medicine

## 2018-03-05 VITALS — BP 126/76 | HR 59 | Ht 63.0 in | Wt 121.6 lb

## 2018-03-05 DIAGNOSIS — E785 Hyperlipidemia, unspecified: Secondary | ICD-10-CM

## 2018-03-05 DIAGNOSIS — I25118 Atherosclerotic heart disease of native coronary artery with other forms of angina pectoris: Secondary | ICD-10-CM

## 2018-03-05 DIAGNOSIS — I255 Ischemic cardiomyopathy: Secondary | ICD-10-CM

## 2018-03-05 DIAGNOSIS — I5022 Chronic systolic (congestive) heart failure: Secondary | ICD-10-CM

## 2018-03-05 NOTE — Patient Instructions (Addendum)
Medication Instructions:  Your physician recommends that you continue on your current medications as directed. Please refer to the Current Medication list given to you today.  -- If you need a refill on your cardiac medications before your next appointment, please call your pharmacy. --  Labwork: None ordered  Testing/Procedures: None ordered  Follow-Up: Your physician wants you to follow-up in: 3 MONTHS with Dr. Angelena Form    Thank you for choosing [facility]!!    Any Other Special Instructions Will Be Listed Below (If Applicable).

## 2018-03-05 NOTE — Progress Notes (Signed)
Follow-up Outpatient Visit Date: 03/05/2018  Primary Care Provider: Loraine Leriche., MD [address] [place] [address]  Chief Complaint: Follow-up coronary artery disease and heart failure  HPI:  Ms. Solomon is a 67 y.o. year-old female with history of  coronary artery diseasewith chronic total occlusion of the mid LAD and PCI to the LCx in 01/8587, chronic systolic heart failure due to ischemic cardiomyopathy, hyperlipidemia, and prior tobacco use, who presents for follow-up of CAD and CHF.  I last saw her in May, at which time she felt relatively stable with exertional dyspnea when walking briskly or for extended periods.  Due to suboptimally controlled LDL, we added ezetimibe to atorvastatin 80 mg daily.  Follow-up lipid panel showed improvement in LDL, now at goal.  Today, the patient reports that she feels about the same as at our last visit.  She still has shortness of breath with modest activity.  She also has sporadic episodes of chest pain and has taken sublingual nitroglycerin on 4 occasions in the last 3 months.  She denies palpitations and lightheadedness but lost her balance and fell while she was shaving her legs.  She has bruises on her arms and legs but did not have any serious injuries.  She otherwise has not fallen.  She remains compliant with her medications including dual antiplatelet therapy with aspirin and clopidogrel.  She has not had any significant edema.  She has stable 2-3 pillow orthopnea.  --------------------------------------------------------------------------------------------------  Cardiovascular History & Procedures: Cardiovascular Problems:  Coronary artery disease with NSTEMI and chronic total occlusion of the proximal/mid LAD  Ischemic cardiomyopathy  Risk Factors:  Known coronary artery disease, hyperlipidemia, tobacco use, age  Cath/PCI:  PCI (08/29/16): Significant left coronary artery disease, including CTO of the mid LAD and 80%  calcified proximal LCx stenosis extending into large OM1 branch. Successful IVUS-guided PCI utilizing orbital atherectomy with placement of a Synergy 3.0 x 32 mm drug-eluting stent. Normal left ventricular filling pressure. Significant right radial artery spasm precluded guide catheter advancement. Therefore, femoral access was used for the intervention.  LHC (07/08/16): Significant two-vessel coronary artery disease including totally calcified chronic total occlusion of the proximal/mid LAD and calcified 80% stenosis of proximal LCx/OM one. LVEF 30% with anterior, apical, and apical inferior disc low normal LVEDP (4 mmHg).  CV Surgery:  None  EP Procedures and Devices:  None  Non-Invasive Evaluation(s):  Right groin ultrasound (09/16/16): Patent arteries and veins without pseudoaneurysm, AV fistula, or obstruction.  Cardiac MRI (07/11/16): Normal LV size and wall thickness with moderately reduced contraction (EF 38%). Akinesis/dyskinesis of the mid and apical anterior/anteroseptal and apical segments. LAD territory is nonviable. RCA and LCx territories are viable. Normal RV size and function. Normal biatrial size.  Transthoracic echocardiogram (07/08/16): Normal LV size and wall thickness with LVEF of 35-40%. Anterior and apical wall motion abnormality present. Grade 1 diastolic dysfunction. Normal RV size and function. No significant valvular abnormalities.  Recent CV Pertinent Labs: Lab Results  Component Value Date   CHOL 153 03/01/2018   HDL 93 03/01/2018   LDLCALC 52 03/01/2018   TRIG 41 03/01/2018   CHOLHDL 1.6 03/01/2018   CHOLHDL 2.0 07/07/2016   INR 1.1 08/25/2016   K 4.6 09/29/2017   BUN 11 09/29/2017   CREATININE 0.93 09/29/2017    Past medical and surgical history were reviewed and updated in EPIC.  Current Meds  Medication Sig  . acetaminophen (TYLENOL) 650 MG CR tablet Take 650 mg by mouth every 8 (  eight) hours as needed for pain (headache).  Marland Kitchen aspirin 81 MG  chewable tablet Chew 1 tablet (81 mg total) by mouth daily.  Marland Kitchen atorvastatin (LIPITOR) 80 MG tablet TAKE 1 TABLET(80 MG) BY MOUTH DAILY  . carvedilol (COREG) 6.25 MG tablet TAKE 1 TABLET(6.25 MG) BY MOUTH TWICE DAILY  . clopidogrel (PLAVIX) 75 MG tablet Take 1 tablet (75 mg total) by mouth daily with breakfast.  . ezetimibe (ZETIA) 10 MG tablet Take 1 tablet (10 mg total) by mouth daily.  . furosemide (LASIX) 20 MG tablet Take 1 pill every Monday, Wednesday, and Friday  . levothyroxine (SYNTHROID, LEVOTHROID) 25 MCG tablet Take 37.5 mcg by mouth daily before breakfast.  . losartan (COZAAR) 50 MG tablet Take 1 tablet (50 mg total) by mouth daily.  . nitroGLYCERIN (NITROSTAT) 0.4 MG SL tablet Place 1 tablet (0.4 mg total) under the tongue every 5 (five) minutes x 3 doses as needed for chest pain.  Marland Kitchen RANEXA 1000 MG SR tablet TAKE 1 TABLET BY MOUTH TWICE DAILY    Allergies: Lisinopril and Spironolactone  Social History   Tobacco Use  . Smoking status: Former Smoker    Packs/day: 1.00    Years: 50.00    Pack years: 50.00    Types: Cigarettes    Last attempt to quit: 07/07/2016    Years since quitting: 1.6  . Smokeless tobacco: Never Used  Substance Use Topics  . Alcohol use: No  . Drug use: No    Family History  Problem Relation Age of Onset  . Kidney failure Mother     Review of Systems: A 12-system review of systems was performed and was negative except as noted in the HPI.  --------------------------------------------------------------------------------------------------  Physical Exam: BP 126/76   Pulse (!) 59   Ht 5\' 3"  (1.6 m)   Wt 121 lb 9.6 oz (55.2 kg)   SpO2 99%   BMI 21.54 kg/m   General: NAD. HEENT: No conjunctival pallor or scleral icterus. Moist mucous membranes.  OP clear. Neck: Supple without lymphadenopathy, thyromegaly, JVD, or HJR. No carotid bruit without wheezes or crackles. Heart: Regular rate and rhythm without murmurs, rubs, or gallops.  Non-displaced PMI. Abd: Bowel sounds present. Soft, NT/ND without hepatosplenomegaly Ext: No lower extremity edema. Radial, PT, and DP pulses are 2+ bilaterally. Skin: Warm and dry.  Ecchymoses noted on right upper arm and left ankle.  EKG: Sinus bradycardia (heart rate 59 bpm) with anterolateral T wave inversions.  No significant change from prior tracing.  Lab Results  Component Value Date   WBC 7.3 08/30/2016   HGB 11.5 (L) 08/30/2016   HCT 33.8 (L) 08/30/2016   MCV 87.8 08/30/2016   PLT 219 08/30/2016    Lab Results  Component Value Date   NA 132 (L) 09/29/2017   K 4.6 09/29/2017   CL 94 (L) 09/29/2017   CO2 25 09/29/2017   BUN 11 09/29/2017   CREATININE 0.93 09/29/2017   GLUCOSE 92 09/29/2017   ALT 15 03/01/2018    Lab Results  Component Value Date   CHOL 153 03/01/2018   HDL 93 03/01/2018   LDLCALC 52 03/01/2018   TRIG 41 03/01/2018   CHOLHDL 1.6 03/01/2018    --------------------------------------------------------------------------------------------------  ASSESSMENT AND PLAN: Coronary artery disease with stable angina Overall, symptoms are relatively stable.  We have agreed to continue with her current regimen consisting of carvedilol and ranolazine.  She can continue to use nitroglycerin as needed.  If symptoms worsen, we would need to consider  repeat cardiac catheterization as I feel that noninvasive imaging would be challenging to interpret given her large LAD territory scar.  In the setting of her extensive multivessel CAD and LMCA/LCx stent, we will plan to continue indefinite dual antiplatelet therapy with aspirin and clopidogrel unless falls become more frequent in which case the risks of continuing clopidogrel may outweigh benefits.  Chronic systolic heart failure due to ischemic cardiomyopathy Ms. Zaro appears euvolemic with NYHA class II-III symptoms.  Her weight has also been stable.  We will continue her current regimen of carvedilol 6.25 mg twice  daily and losartan 50 mg daily.  Soft blood pressure and heart rate precludes up titration at this time.  Hyperlipidemia (goal LDL less than 70) LDL at goal with combination of atorvastatin 80 mg daily and ezetimibe 10 mg daily.  We will continue this regimen indefinitely.  Follow-up: Given my transition to the [place] office, I will have Ms. Fata follow-up with Dr. Angelena Form in approximately 3 months.  [name], MD 03/05/2018 2:04 PM

## 2018-03-28 ENCOUNTER — Other Ambulatory Visit: Payer: Self-pay | Admitting: Internal Medicine

## 2018-03-28 NOTE — Telephone Encounter (Signed)
Refill Request.  

## 2018-05-14 ENCOUNTER — Encounter: Payer: Self-pay | Admitting: Gastroenterology

## 2018-06-11 ENCOUNTER — Encounter: Payer: Self-pay | Admitting: Cardiovascular Disease

## 2018-06-11 ENCOUNTER — Ambulatory Visit: Payer: Federal, State, Local not specified - PPO | Admitting: Cardiovascular Disease

## 2018-06-11 VITALS — BP 118/60 | HR 60 | Ht 63.0 in | Wt 121.2 lb

## 2018-06-11 DIAGNOSIS — I5022 Chronic systolic (congestive) heart failure: Secondary | ICD-10-CM | POA: Diagnosis not present

## 2018-06-11 DIAGNOSIS — E785 Hyperlipidemia, unspecified: Secondary | ICD-10-CM

## 2018-06-11 DIAGNOSIS — I255 Ischemic cardiomyopathy: Secondary | ICD-10-CM | POA: Diagnosis not present

## 2018-06-11 DIAGNOSIS — I25118 Atherosclerotic heart disease of native coronary artery with other forms of angina pectoris: Secondary | ICD-10-CM | POA: Diagnosis not present

## 2018-06-11 NOTE — Patient Instructions (Signed)
Medication Instructions:  Your physician has recommended you make the following change in your medication: Stop aspirin   If you need a refill on your cardiac medications before your next appointment, please call your pharmacy.   Lab work: none If you have labs (blood work) drawn today and your tests are completely normal, you will receive your results only by: . MyChart Message (if you have MyChart) OR . A paper copy in the mail If you have any lab test that is abnormal or we need to change your treatment, we will call you to review the results.  Testing/Procedures: none  Follow-Up: At [facility], you and your health needs are our priority.  As part of our continuing mission to provide you with exceptional heart care, we have created designated Provider Care Teams.  These Care Teams include your primary Cardiologist (physician) and Advanced Practice Providers (APPs -  Physician Assistants and Nurse Practitioners) who all work together to provide you with the care you need, when you need it. You will need a follow up appointment in 6 months.  Please call our office 2 months in advance to schedule this appointment.  You may see Christopher McAlhany, MD or one of the following Advanced Practice Providers on your designated Care Team:   Brittainy Simmons, PA-C Dayna Dunn, PA-C . Michele Lenze, PA-C  Any Other Special Instructions Will Be Listed Below (If Applicable).     

## 2018-06-11 NOTE — Progress Notes (Signed)
Chief Complaint  Patient presents with  . Follow-up    CAD   History of Present Illness: 67 yo female with history of CAD, ischemic cardiomyopathy, chronic systolic CHF, HTN, HLD and hypothyroidism who is here today for follow up. She has been followed by Dr. Saunders Revel. I am meeting her for the first time today. She was admitted to [facility] in December 2017 with a NSTEMI. She was found to have a chronic occlusion of the mid LAD and severe, calcified stenosis in the proximal Circumflex.  LVEF was 30% by LV gram and 35-40% by echo. Cardiac MRI December 2017 with non-viability of the anterior wall in the LAD territory. LVEF=38% by cardiac MRI. The Circumflex stenosis was treated with orbital atherectomy and stenting using a drug eluting stent in a staged procedure in January 2018.  She is here today for follow up. She has rare chest pains that respond to NTG. No falls. The patient denies any palpitations, lower extremity edema, orthopnea, PND, dizziness, near syncope or syncope. She has baseline dyspnea with moderate exertion. She has had nosebleeds several days per week for the past month.   Primary Care Physician: Loraine Leriche., MD   Past Medical History:  Diagnosis Date  . Abnormal EKG   . Alcoholic intoxication without complication ([place])   . Cardiomyopathy, ischemic   . Chest pain 08/29/2016  . Chronic systolic heart failure ([place]) 08/29/2016  . Coronary artery disease of native artery of native heart with stable angina pectoris ([place])   . Facial laceration   . Fall 07/07/2016  . Hyperlipidemia   . Hypertension 03/03/2017  . Hyponatremia   . Hypothyroidism 07/08/2016  . NSTEMI (non-ST elevated myocardial infarction) ([place])   . Syncope   . Thyroid disease   . Unstable angina ([place]) 08/29/2016    Past Surgical History:  Procedure Laterality Date  . ABDOMINAL HYSTERECTOMY    . CARDIAC CATHETERIZATION N/A 07/08/2016   Procedure: Left Heart Cath and Coronary Angiography;  Surgeon: [name], MD;  Location: [facility];  Service: Cardiovascular;  Laterality: N/A;  . CARDIAC CATHETERIZATION N/A 08/29/2016   Procedure: Coronary Atherectomy;  Surgeon: [name], MD;  Location: [facility];  Service: Cardiovascular;  Laterality: N/A;  . CARDIAC CATHETERIZATION N/A 08/29/2016   Procedure: Coronary Stent Intervention;  Surgeon: [name], MD;  Location: [facility];  Service: Cardiovascular;  Laterality: N/A;  . CATARACT EXTRACTION, BILATERAL      Current Outpatient Medications  Medication Sig Dispense Refill  . acetaminophen (TYLENOL) 650 MG CR tablet Take 650 mg by mouth every 8 (eight) hours as needed for pain (headache).    Marland Kitchen atorvastatin (LIPITOR) 80 MG tablet TAKE 1 TABLET(80 MG) BY MOUTH DAILY 90 tablet 3  . carvedilol (COREG) 6.25 MG tablet TAKE 1 TABLET(6.25 MG) BY MOUTH TWICE DAILY 180 tablet 3  . clopidogrel (PLAVIX) 75 MG tablet Take 1 tablet (75 mg total) by mouth daily with breakfast. 90 tablet 3  . ezetimibe (ZETIA) 10 MG tablet Take 1 tablet (10 mg total) by mouth daily. 90 tablet 3  . furosemide (LASIX) 20 MG tablet Take 1 pill every Monday, Wednesday, and Friday 45 tablet 3  . levothyroxine (SYNTHROID, LEVOTHROID) 25 MCG tablet Take 37.5 mcg by mouth daily before breakfast.    . losartan (COZAAR) 50 MG tablet TAKE 1 TABLET(50 MG) BY MOUTH DAILY 90 tablet 3  . nitroGLYCERIN (NITROSTAT) 0.4 MG SL tablet Place 1 tablet (0.4 mg total) under the  tongue every 5 (five) minutes x 3 doses as needed for chest pain. 75 tablet 2  . RANEXA 1000 MG SR tablet TAKE 1 TABLET BY MOUTH TWICE DAILY 180 tablet 3   No current facility-administered medications for this visit.     Allergies  Allergen Reactions  . Lisinopril     Dry cough  . Spironolactone Other (See Comments)    Low sodium    Social History   Socioeconomic History  . Marital status: Married    Spouse name: Not on file  . Number of children: 2  . Years of education: Not on file   . Highest education level: Not on file  Occupational History  . Occupation: Retired-worked for post office  Social Needs  . Financial resource strain: Not on file  . Food insecurity:    Worry: Not on file    Inability: Not on file  . Transportation needs:    Medical: Not on file    Non-medical: Not on file  Tobacco Use  . Smoking status: Former Smoker    Packs/day: 1.00    Years: 50.00    Pack years: 50.00    Types: Cigarettes    Last attempt to quit: 07/07/2016    Years since quitting: 1.9  . Smokeless tobacco: Never Used  Substance and Sexual Activity  . Alcohol use: No  . Drug use: No  . Sexual activity: Not on file  Lifestyle  . Physical activity:    Days per week: Not on file    Minutes per session: Not on file  . Stress: Not on file  Relationships  . Social connections:    Talks on phone: Not on file    Gets together: Not on file    Attends religious service: Not on file    Active member of club or organization: Not on file    Attends meetings of clubs or organizations: Not on file    Relationship status: Not on file  . Intimate partner violence:    Fear of current or ex partner: Not on file    Emotionally abused: Not on file    Physically abused: Not on file    Forced sexual activity: Not on file  Other Topics Concern  . Not on file  Social History Narrative  . Not on file    Family History  Problem Relation Age of Onset  . Kidney failure Mother     Review of Systems:  As stated in the HPI and otherwise negative.   BP 118/60   Pulse 60   Ht 5\' 3"  (1.6 m)   Wt 121 lb 3.2 oz (55 kg)   SpO2 99%   BMI 21.47 kg/m   Physical Examination: General: Well developed, well nourished, NAD  HEENT: OP clear, mucus membranes moist  SKIN: warm, dry. No rashes. Neuro: No focal deficits  Musculoskeletal: Muscle strength 5/5 all ext  Psychiatric: Mood and affect normal  Neck: No JVD, no carotid bruits, no thyromegaly, no lymphadenopathy.  Lungs:Clear  bilaterally, no wheezes, rhonci, crackles Cardiovascular: Regular rate and rhythm. No murmurs, gallops or rubs. Abdomen:Soft. Bowel sounds present. Non-tender.  Extremities: No lower extremity edema. Pulses are 2 + in the bilateral DP/PT.  EKG:  EKG is not ordered today. The ekg ordered today demonstrates   Recent Labs: 09/13/2017: NT-Pro BNP 774 09/29/2017: BUN 11; Creatinine, Ser 0.93; Potassium 4.6; Sodium 132 03/01/2018: ALT 15   Lipid Panel    Component Value Date/Time   CHOL 153  03/01/2018 0801   TRIG 41 03/01/2018 0801   HDL 93 03/01/2018 0801   CHOLHDL 1.6 03/01/2018 0801   CHOLHDL 2.0 07/07/2016 1914   VLDL 17 07/07/2016 1914   LDLCALC 52 03/01/2018 0801     Wt Readings from Last 3 Encounters:  06/11/18 121 lb 3.2 oz (55 kg)  03/05/18 121 lb 9.6 oz (55.2 kg)  12/04/17 123 lb 3.2 oz (55.9 kg)     Other studies Reviewed: Additional studies/ records that were reviewed today include: . Review of the above records demonstrates:    Assessment and Plan:   1. CAD with stable angina: Rare chest pains. Will continue Plavix but will stop ASA given more frequent nosebleeds. Continue statin, Coreg and Ranexa.   2. Chronic systolic CHF/Ischemic cardiomyopathy: Volume status is ok today. She is NYHA class 2. Continue Coreg and Losartan. Cannot titrate due to soft BP. Cannot consider Entresto due to her soft BP.   3. Hyperlipidemia: LDL at goal. Continue statin and Zetia.   4. Tobacco abuse, in remission: She stopped smoking in 2017.   Current medicines are reviewed at length with the patient today.  The patient does not have concerns regarding medicines.  The following changes have been made:  no change  Labs/ tests ordered today include:  No orders of the defined types were placed in this encounter.  Disposition:   FU with me in 6 months  Signed, Lauree Chandler, MD 06/11/2018 9:46 AM    [facility] [place], [place], [place]   [address] Phone: [phone]; Fax: [phone]

## 2018-06-13 ENCOUNTER — Encounter: Payer: Self-pay | Admitting: Gastroenterology

## 2018-06-13 ENCOUNTER — Ambulatory Visit: Payer: Federal, State, Local not specified - PPO | Admitting: Gastroenterology

## 2018-06-13 ENCOUNTER — Telehealth: Payer: Self-pay | Admitting: Emergency Medicine

## 2018-06-13 VITALS — BP 106/58 | HR 68 | Ht 62.5 in | Wt 122.1 lb

## 2018-06-13 DIAGNOSIS — Z1211 Encounter for screening for malignant neoplasm of colon: Secondary | ICD-10-CM

## 2018-06-13 DIAGNOSIS — Z7901 Long term (current) use of anticoagulants: Secondary | ICD-10-CM

## 2018-06-13 DIAGNOSIS — K5904 Chronic idiopathic constipation: Secondary | ICD-10-CM | POA: Diagnosis not present

## 2018-06-13 MED ORDER — NA SULFATE-K SULFATE-MG SULF 17.5-3.13-1.6 GM/177ML PO SOLN: 1.0000 | ORAL | 0 refills | Status: AC

## 2018-06-13 NOTE — Telephone Encounter (Signed)
Routing to Dr. Angelena Form regarding holding of Plavix.   Dr. Angelena Form, do you agree with holding Plavix and if so, for how long?  Please route your response back to CV Basalt, RN, [place] [address] [place], [place]  [address] [phone]

## 2018-06-13 NOTE — Telephone Encounter (Signed)
[facility] Pre-operative Risk Assessment     Request for surgical clearance:     Endoscopy Procedure  What type of surgery is being performed?     colonoscopy  When is this surgery scheduled?     07-16-18  What type of clearance is required ?   Pharmacy  Are there any medications that need to be held prior to surgery and how long? plavix - 5 days  Practice name and name of physician performing surgery?      [facility]  What is your office phone and fax number?      Phone- [phone]  [phone]  Anesthesia type (None, local, MAC, general) ?       MAC

## 2018-06-13 NOTE — Telephone Encounter (Signed)
Routing to requesting provider.   Brittnye Josephs C. Chantel Teti, RN, ANP-C [facility] [address] [place], [place]  [address] [phone]  

## 2018-06-13 NOTE — Patient Instructions (Addendum)
I recommend increasing the water that you drink every day. Your goal should be at least 64 ounces of water daily.   Metamucil, Citrucel, or Benefiber may be helpful.   Tips for colonoscopy:  -STAY WELL HYDRATED FOR 3-4 DAYS PRIOR TO THE EXAM. This reduces nausea and dehydration.  -TO PREVENT SKIN/HEMORRHOID IRRITATION- prior to wiping, put A&Dointment or vaseline on the toilet paper. -Keep a towel or pad on the bed.  -DRINK 64oz of clear liquids in the morning of prep day (PRIOR TO STARTING THE PREP) to be sure that there is enough fluid to flush the colon and stay hydrated!!!! This is in addition to the fluids required for preparation.

## 2018-06-13 NOTE — Progress Notes (Signed)
Referring Provider: Loraine Leriche.,* Primary Care Physician:  Nickola Major   Reason for Consultation:  Screening colonoscopy   IMPRESSION:  Need for colon cancer screening Chronic constipation On Plavix  Discussed options for colon cancer screening. She would like to proceed with colonoscopy. Ideally, would hold Plavix for 5 days before endoscopy.  I discussed with the patient that there is a low, but real, risk of a cardiovascular event such as heart attack, stroke, or embolism/thrombosis while off Plavix. Will communicate by phone or EMR with patient's prescribing provider to confirm that holding the Plavix is appropriate at this time.   PLAN: Colonoscopy off Plavix if approved by prescribing physician Increase daily water and fiber intake, add daily psyllium for stool bulking  I consented the patient at the bedside today discussing the risks, benefits, and alternatives to endoscopic evaluation. In particular, we discussed the risks that include, but are not limited to, reaction to medication, cardiopulmonary compromise, bleeding requiring blood transfusion, aspiration resulting in pneumonia, perforation requiring surgery, and even death. We reviewed the risk of missed lesion including polyps or even cancer. The patient acknowledges these risks and asks that we proceed.   HPI: Pam [name] is a 67 y.o. female seen for colon cancer screening. Office visit recommended given her daily use of Plavix. She has CAD s/p MI, ischemic cardiomyopathy, chronic systolic heart failure, hypertension, hypercholesterolemia, hyperlipidemia, hypertension, hypothyroidism. On Plavix.   GI ROS is negative except for chronic constipation. She will have one to two bowel movements each week. Will use Dulcolax on a rare occasional if needed.  She does not feel that a change in her regimen is necessary at this time.   No prior colon cancer screening per the patient. However the referral records  note a colonoscopy over 10 years ago.  No known family history of colon cancer or polyps.    Past Medical History:  Diagnosis Date  . Abnormal EKG   . Alcoholic intoxication without complication ([place])   . Cardiomyopathy, ischemic   . Chest pain 08/29/2016  . Chronic systolic heart failure ([place]) 08/29/2016  . Coronary artery disease of native artery of native heart with stable angina pectoris ([place])   . Facial laceration   . Fall 07/07/2016  . Hyperlipidemia   . Hypertension 03/03/2017  . Hyponatremia   . Hypothyroidism 07/08/2016  . NSTEMI (non-ST elevated myocardial infarction) ([place])   . Syncope   . Thyroid disease   . Unstable angina ([place]) 08/29/2016    Past Surgical History:  Procedure Laterality Date  . ABDOMINAL HYSTERECTOMY    . CARDIAC CATHETERIZATION N/A 07/08/2016   Procedure: Left Heart Cath and Coronary Angiography;  Surgeon: [name], MD;  Location: [facility];  Service: Cardiovascular;  Laterality: N/A;  . CARDIAC CATHETERIZATION N/A 08/29/2016   Procedure: Coronary Atherectomy;  Surgeon: [name], MD;  Location: [facility];  Service: Cardiovascular;  Laterality: N/A;  . CARDIAC CATHETERIZATION N/A 08/29/2016   Procedure: Coronary Stent Intervention;  Surgeon: [name], MD;  Location: [facility];  Service: Cardiovascular;  Laterality: N/A;  . CATARACT EXTRACTION, BILATERAL      Current Outpatient Medications  Medication Sig Dispense Refill  . acetaminophen (TYLENOL) 650 MG CR tablet Take 650 mg by mouth every 8 (eight) hours as needed for pain (headache).    Marland Kitchen atorvastatin (LIPITOR) 80 MG tablet TAKE 1 TABLET(80 MG) BY MOUTH DAILY 90 tablet 3  . carvedilol (COREG) 6.25 MG tablet TAKE 1  TABLET(6.25 MG) BY MOUTH TWICE DAILY 180 tablet 3  . clopidogrel (PLAVIX) 75 MG tablet Take 1 tablet (75 mg total) by mouth daily with breakfast. 90 tablet 3  . ezetimibe (ZETIA) 10 MG tablet Take 1 tablet (10 mg total) by mouth daily. 90 tablet 3    . furosemide (LASIX) 20 MG tablet Take 1 pill every Monday, Wednesday, and Friday 45 tablet 3  . levothyroxine (SYNTHROID, LEVOTHROID) 25 MCG tablet Take 37.5 mcg by mouth daily before breakfast.    . losartan (COZAAR) 50 MG tablet TAKE 1 TABLET(50 MG) BY MOUTH DAILY 90 tablet 3  . nitroGLYCERIN (NITROSTAT) 0.4 MG SL tablet Place 1 tablet (0.4 mg total) under the tongue every 5 (five) minutes x 3 doses as needed for chest pain. 75 tablet 2  . RANEXA 1000 MG SR tablet TAKE 1 TABLET BY MOUTH TWICE DAILY 180 tablet 3   No current facility-administered medications for this visit.     Allergies as of 06/13/2018 - Review Complete 06/11/2018  Allergen Reaction Noted  . Lisinopril  03/03/2017  . Spironolactone Other (See Comments) 03/07/2017    Family History  Problem Relation Age of Onset  . Kidney failure Mother     Social History   Socioeconomic History  . Marital status: Married    Spouse name: Not on file  . Number of children: 2  . Years of education: Not on file  . Highest education level: Not on file  Occupational History  . Occupation: Retired-worked for post office  Social Needs  . Financial resource strain: Not on file  . Food insecurity:    Worry: Not on file    Inability: Not on file  . Transportation needs:    Medical: Not on file    Non-medical: Not on file  Tobacco Use  . Smoking status: Former Smoker    Packs/day: 1.00    Years: 50.00    Pack years: 50.00    Types: Cigarettes    Last attempt to quit: 07/07/2016    Years since quitting: 1.9  . Smokeless tobacco: Never Used  Substance and Sexual Activity  . Alcohol use: No  . Drug use: No  . Sexual activity: Not on file  Lifestyle  . Physical activity:    Days per week: Not on file    Minutes per session: Not on file  . Stress: Not on file  Relationships  . Social connections:    Talks on phone: Not on file    Gets together: Not on file    Attends religious service: Not on file    Active member of  club or organization: Not on file    Attends meetings of clubs or organizations: Not on file    Relationship status: Not on file  . Intimate partner violence:    Fear of current or ex partner: Not on file    Emotionally abused: Not on file    Physically abused: Not on file    Forced sexual activity: Not on file  Other Topics Concern  . Not on file  Social History Narrative  . Not on file    Review of Systems: 12 system ROS is negative except as noted above except for nosebleeds and urine leakage.   Physical Exam: Vital signs were reviewed. General:   Alert, well-nourished, pleasant and cooperative in NAD Head:  Normocephalic and atraumatic. Eyes:  Sclera clear, no icterus.   Conjunctiva pink. Mouth:  No deformity or lesions.   Neck:  Supple; no thyromegaly. Lungs:  Clear throughout to auscultation.   No wheezes.  Heart:  Regular rate and rhythm; no murmurs Abdomen:  Soft, nontender, normal bowel sounds. No rebound or guarding. No hepatosplenomegaly Rectal:  Deferred  Msk:  Symmetrical without gross deformities. Extremities:  No gross deformities or edema. Neurologic:  Alert and  oriented x4;  grossly nonfocal Skin:  No rash or bruise. Psych:  Alert and cooperative. Normal mood and affect.   Siddharth Babington L. Tarri Glenn Md, MPH [facility] 06/13/2018, 8:11 AM

## 2018-06-13 NOTE — Telephone Encounter (Signed)
OK to hold Plavix 5 days before procedure.    

## 2018-06-18 NOTE — Telephone Encounter (Signed)
Spoke to patient she is aware she will need to hold Plavix 5 days before the procedure and then resume it per her discharge instructions after her colonoscopy.

## 2018-07-16 ENCOUNTER — Ambulatory Visit (AMBULATORY_SURGERY_CENTER): Payer: Federal, State, Local not specified - PPO | Admitting: Gastroenterology

## 2018-07-16 ENCOUNTER — Encounter: Payer: Self-pay | Admitting: Gastroenterology

## 2018-07-16 VITALS — BP 113/55 | HR 68 | Temp 97.7°F | Resp 11 | Ht 62.0 in | Wt 122.0 lb

## 2018-07-16 DIAGNOSIS — D124 Benign neoplasm of descending colon: Secondary | ICD-10-CM | POA: Diagnosis not present

## 2018-07-16 DIAGNOSIS — D128 Benign neoplasm of rectum: Secondary | ICD-10-CM | POA: Diagnosis not present

## 2018-07-16 DIAGNOSIS — Z1211 Encounter for screening for malignant neoplasm of colon: Secondary | ICD-10-CM

## 2018-07-16 MED ORDER — SODIUM CHLORIDE 0.9 % IV SOLN: 500.0000 mL | Freq: Once | INTRAVENOUS | Status: DC

## 2018-07-16 NOTE — Op Note (Addendum)
[facility] [place] Patient Name: Pam [name] Procedure Date: 07/16/2018 2:17 PM MRN: [id number] Endoscopist: Thornton Park MD, MD Age: 67 Referring MD:  Date of Birth: 1950/11/09 Gender: Female Account #: [id number] Procedure:                Colonoscopy Indications:              Screening for colorectal malignant neoplasm, This                            is the patient's first colonoscopy. Chronic                            constipation. No known family history of colon                            cancer or polyps. Medicines:                See the Anesthesia note for documentation of the                            administered medications Procedure:                Pre-Anesthesia Assessment:                           - Prior to the procedure, a History and Physical                            was performed, and patient medications and                            allergies were reviewed. The patient's tolerance of                            previous anesthesia was also reviewed. The risks                            and benefits of the procedure and the sedation                            options and risks were discussed with the patient.                            All questions were answered, and informed consent                            was obtained. Prior Anticoagulants: The patient has                            taken Plavix (clopidogrel), last dose was 5 days                            prior to procedure. ASA Grade Assessment: III - A  patient with severe systemic disease. After                            reviewing the risks and benefits, the patient was                            deemed in satisfactory condition to undergo the                            procedure.                           After obtaining informed consent, the colonoscope                            was passed under direct vision. Throughout the                             procedure, the patient's blood pressure, pulse, and                            oxygen saturations were monitored continuously. The                            Colonoscope was introduced through the anus and                            advanced to the the terminal ileum, with                            identification of the appendiceal orifice and IC                            valve. The colonoscopy was performed without                            difficulty. The patient tolerated the procedure                            well. The quality of the bowel preparation was good. Scope In: 2:33:01 PM Scope Out: 2:52:28 PM Scope Withdrawal Time: 0 hours 10 minutes 6 seconds  Total Procedure Duration: 0 hours 19 minutes 27 seconds  Findings:                 The perianal and digital rectal examinations were                            normal.                           A 5 mm polyp was found in the proximal descending                            colon. The polyp was sessile. The polyp was removed  with a cold snare. Resection and retrieval were                            complete. Estimated blood loss was minimal.                           A 2 mm polyp was found in the descending colon. The                            polyp was sessile. The polyp was removed with a                            cold snare. Resection and retrieval were complete.                            Estimated blood loss was minimal.                           A 10 mm polyp was found in the rectum. The polyp                            was semi-pedunculated. The polyp was removed with a                            hot snare. Resection and retrieval were complete.                            Estimated blood loss: none.                           The exam was otherwise without abnormality on                            direct and retroflexion views. Complications:            No immediate complications. Estimated blood  loss:                            Minimal. Estimated Blood Loss:     Estimated blood loss was minimal. Impression:               - One 5 mm polyp in the proximal descending colon,                            removed with a cold snare. Resected and retrieved.                           - One 2 mm polyp in the descending colon, removed                            with a cold snare. Resected and retrieved.                           - One 10 mm  polyp in the rectum, removed with a hot                            snare. Resected and retrieved.                           - The examination was otherwise normal on direct                            and retroflexion views. Recommendation:           - Discharge patient to home.                           - Resume regular diet.                           - Continue present medications.                           - Await pathology results.                           - Repeat colonoscopy in 3 years if all three polyps                            are adenomas. I will send you a letter when the                            pathology results are available.                           - May resume Plavix tomorrow. Thornton Park MD, MD 07/16/2018 3:00:32 PM This report has been signed electronically.

## 2018-07-16 NOTE — Progress Notes (Signed)
Called to room to assist during endoscopic procedure.  Patient ID and intended procedure confirmed with present staff. Received instructions for my participation in the procedure from the performing physician.  

## 2018-07-16 NOTE — Progress Notes (Signed)
PT taken to PACU. Monitors in place. VSS. Report given to RN. 

## 2018-07-16 NOTE — Patient Instructions (Signed)
YOU HAD AN ENDOSCOPIC PROCEDURE TODAY AT THE [facility]:   Refer to the procedure report that was given to you for any specific questions about what was found during the examination.  If the procedure report does not answer your questions, please call your gastroenterologist to clarify.  If you requested that your care partner not be given the details of your procedure findings, then the procedure report has been included in a sealed envelope for you to review at your convenience later.  YOU SHOULD EXPECT: Some feelings of bloating in the abdomen. Passage of more gas than usual.  Walking can help get rid of the air that was put into your GI tract during the procedure and reduce the bloating. If you had a lower endoscopy (such as a colonoscopy or flexible sigmoidoscopy) you may notice spotting of blood in your stool or on the toilet paper. If you underwent a bowel prep for your procedure, you may not have a normal bowel movement for a few days.  Please Note:  You might notice some irritation and congestion in your nose or some drainage.  This is from the oxygen used during your procedure.  There is no need for concern and it should clear up in a day or so.  SYMPTOMS TO REPORT IMMEDIATELY:   Following lower endoscopy (colonoscopy or flexible sigmoidoscopy):  Excessive amounts of blood in the stool  Significant tenderness or worsening of abdominal pains  Swelling of the abdomen that is new, acute  Fever of 100F or higher  For urgent or emergent issues, a gastroenterologist can be reached at any hour by calling [phone].   DIET:  We do recommend a small meal at first, but then you may proceed to your regular diet.  Drink plenty of fluids but you should avoid alcoholic beverages for 24 hours.  ACTIVITY:  You should plan to take it easy for the rest of today and you should NOT DRIVE or use heavy machinery until tomorrow (because of the sedation medicines used during the test).     FOLLOW UP: Our staff will call the number listed on your records the next business day following your procedure to check on you and address any questions or concerns that you may have regarding the information given to you following your procedure. If we do not reach you, we will leave a message.  However, if you are feeling well and you are not experiencing any problems, there is no need to return our call.  We will assume that you have returned to your regular daily activities without incident.  If any biopsies were taken you will be contacted by phone or by letter within the next 1-3 weeks.  Please call us at [phone] if you have not heard about the biopsies in 3 weeks.    SIGNATURES/CONFIDENTIALITY: You and/or your care partner have signed paperwork which will be entered into your electronic medical record.  These signatures attest to the fact that that the information above on your After Visit Summary has been reviewed and is understood.  Full responsibility of the confidentiality of this discharge information lies with you and/or your care-partner. 

## 2018-07-17 ENCOUNTER — Telehealth: Payer: Self-pay

## 2018-07-17 NOTE — Telephone Encounter (Signed)
  Follow up Call-  Call back number 07/16/2018  Post procedure Call Back phone  # [phone]  Permission to leave phone message Yes  Some recent data might be hidden     Patient questions:  Do you have a fever, pain , or abdominal swelling? No. Pain Score  0 *  Have you tolerated food without any problems? Yes.    Have you been able to return to your normal activities? Yes.    Do you have any questions about your discharge instructions: Diet   No. Medications  No. Follow up visit  No.  Do you have questions or concerns about your Care? No.  Actions: * If pain score is 4 or above: No action needed, pain <4.

## 2018-07-20 ENCOUNTER — Encounter: Payer: Self-pay | Admitting: Gastroenterology

## 2018-08-15 ENCOUNTER — Other Ambulatory Visit: Payer: Self-pay | Admitting: Internal Medicine

## 2018-08-15 NOTE — Telephone Encounter (Signed)
Please review for refill, Thanks !  

## 2018-08-23 ENCOUNTER — Other Ambulatory Visit: Payer: Self-pay | Admitting: Physician Assistant

## 2018-11-25 ENCOUNTER — Other Ambulatory Visit: Payer: Self-pay | Admitting: Internal Medicine

## 2018-11-25 DIAGNOSIS — I25118 Atherosclerotic heart disease of native coronary artery with other forms of angina pectoris: Secondary | ICD-10-CM

## 2018-11-25 DIAGNOSIS — E785 Hyperlipidemia, unspecified: Secondary | ICD-10-CM

## 2018-11-26 NOTE — Telephone Encounter (Signed)
Refill Request.  

## 2018-12-05 ENCOUNTER — Telehealth: Payer: Self-pay | Admitting: Cardiovascular Disease

## 2018-12-05 NOTE — Telephone Encounter (Signed)
PHONE/Doximity Visit on 12/10/2018.   Patient is going to call back if she can get camera to work on smartphone to change to video visit.     Phone Call to obtain consent     -   12/05/2018          Virtual Visit Pre-Appointment Phone Call  "(Name), I am calling you today to discuss your upcoming appointment. We are currently trying to limit exposure to the virus that causes COVID-19 by seeing patients at home rather than in the office."  1. "What is the BEST phone number to call the day of the visit?" - include this in appointment notes  2. Do you have or have access to (through a family member/friend) a smartphone with video capability that we can use for your visit?" a. If yes - list this number in appt notes as cell (if different from BEST phone #) and list the appointment type as a VIDEO visit in appointment notes b. If no - list the appointment type as a PHONE visit in appointment notes  3. Confirm consent - "In the setting of the current Covid19 crisis, you are scheduled for a (phone or video) visit with your provider on (date) at (time).  Just as we do with many in-office visits, in order for you to participate in this visit, we must obtain consent.  If you'd like, I can send this to your mychart (if signed up) or email for you to review.  Otherwise, I can obtain your verbal consent now.  All virtual visits are billed to your insurance company just like a normal visit would be.  By agreeing to a virtual visit, we'd like you to understand that the technology does not allow for your provider to perform an examination, and thus may limit your provider's ability to fully assess your condition. If your provider identifies any concerns that need to be evaluated in person, we will make arrangements to do so.  Finally, though the technology is pretty good, we cannot assure that it will always work on either your or our end, and in the setting of a video visit, we may have to convert  it to a phone-only visit.  In either situation, we cannot ensure that we have a secure connection.  Are you willing to proceed?" STAFF: Did the patient verbally acknowledge consent to telehealth visit? Document YES/NO here: YES  4. Advise patient to be prepared - "Two hours prior to your appointment, go ahead and check your blood pressure, pulse, oxygen saturation, and your weight (if you have the equipment to check those) and write them all down. When your visit starts, your provider will ask you for this information. If you have an Apple Watch or Kardia device, please plan to have heart rate information ready on the day of your appointment. Please have a pen and paper handy nearby the day of the visit as well."  5. Give patient instructions for MyChart download to smartphone OR Doximity/Doxy.me as below if video visit (depending on what platform provider is using)  6. Inform patient they will receive a phone call 15 minutes prior to their appointment time (may be from unknown caller ID) so they should be prepared to answer    TELEPHONE CALL NOTE  Pam [name] has been deemed a candidate for a follow-up tele-health visit to limit community exposure during the Covid-19 pandemic. I spoke with the patient via phone to ensure availability of phone/video source, confirm preferred email &  phone number, and discuss instructions and expectations.  I reminded Pam [name] to be prepared with any vital sign and/or heart rhythm information that could potentially be obtained via home monitoring, at the time of her visit. I reminded Pam [name] to expect a phone call prior to her visit.  Pam [name] 12/05/2018 10:08 AM   INSTRUCTIONS FOR DOWNLOADING THE MYCHART APP TO SMARTPHONE  - The patient must first make sure to have activated MyChart and know their login information - If Apple, go to [employer] and type in MyChart in the search bar and download the app. If Android, ask patient to go to [employer]  and type in [place] in the search bar and download the app. The app is free but as with any other app downloads, their phone may require them to verify saved payment information or Apple/Android password.  - The patient will need to then log into the app with their MyChart username and password, and select [facility] as their healthcare provider to link the account. When it is time for your visit, go to the MyChart app, find appointments, and click Begin Video Visit. Be sure to Select Allow for your device to access the Microphone and Camera for your visit. You will then be connected, and your provider will be with you shortly.  **If they have any issues connecting, or need assistance please contact MyChart service desk [phone])**  **If using a computer, in order to ensure the best quality for their visit they will need to use either of the following Internet Browsers: [employer], or Google Chrome**  IF USING DOXIMITY or DOXY.ME - The patient will receive a link just prior to their visit by text.     FULL LENGTH CONSENT FOR TELE-HEALTH VISIT   I hereby voluntarily request, consent and authorize [facility] and its employed or contracted physicians, physician assistants, nurse practitioners or other licensed health care professionals (the Practitioner), to provide me with telemedicine health care services (the Services") as deemed necessary by the treating Practitioner. I acknowledge and consent to receive the Services by the Practitioner via telemedicine. I understand that the telemedicine visit will involve communicating with the Practitioner through live audiovisual communication technology and the disclosure of certain medical information by electronic transmission. I acknowledge that I have been given the opportunity to request an in-person assessment or other available alternative prior to the telemedicine visit and am voluntarily participating in the telemedicine  visit.  I understand that I have the right to withhold or withdraw my consent to the use of telemedicine in the course of my care at any time, without affecting my right to future care or treatment, and that the Practitioner or I may terminate the telemedicine visit at any time. I understand that I have the right to inspect all information obtained and/or recorded in the course of the telemedicine visit and may receive copies of available information for a reasonable fee.  I understand that some of the potential risks of receiving the Services via telemedicine include:   Delay or interruption in medical evaluation due to technological equipment failure or disruption;  Information transmitted may not be sufficient (e.g. poor resolution of images) to allow for appropriate medical decision making by the Practitioner; and/or   In rare instances, security protocols could fail, causing a breach of personal health information.  Furthermore, I acknowledge that it is my responsibility to provide information about my medical history, conditions and care that is complete and  accurate to the best of my ability. I acknowledge that Practitioner's advice, recommendations, and/or decision may be based on factors not within their control, such as incomplete or inaccurate data provided by me or distortions of diagnostic images or specimens that may result from electronic transmissions. I understand that the practice of medicine is not an exact science and that Practitioner makes no warranties or guarantees regarding treatment outcomes. I acknowledge that I will receive a copy of this consent concurrently upon execution via email to the email address I last provided but may also request a printed copy by calling the office of [facility].    I understand that my insurance will be billed for this visit.   I have read or had this consent read to me.  I understand the contents of this consent, which adequately explains  the benefits and risks of the Services being provided via telemedicine.   I have been provided ample opportunity to ask questions regarding this consent and the Services and have had my questions answered to my satisfaction.  I give my informed consent for the services to be provided through the use of telemedicine in my medical care  By participating in this telemedicine visit I agree to the above.

## 2018-12-10 ENCOUNTER — Other Ambulatory Visit: Payer: Self-pay

## 2018-12-10 ENCOUNTER — Encounter: Payer: Self-pay | Admitting: Cardiovascular Disease

## 2018-12-10 ENCOUNTER — Telehealth (INDEPENDENT_AMBULATORY_CARE_PROVIDER_SITE_OTHER): Payer: Federal, State, Local not specified - PPO | Admitting: Cardiovascular Disease

## 2018-12-10 VITALS — Ht 62.0 in | Wt 124.0 lb

## 2018-12-10 DIAGNOSIS — I25118 Atherosclerotic heart disease of native coronary artery with other forms of angina pectoris: Secondary | ICD-10-CM | POA: Diagnosis not present

## 2018-12-10 DIAGNOSIS — I255 Ischemic cardiomyopathy: Secondary | ICD-10-CM | POA: Diagnosis not present

## 2018-12-10 DIAGNOSIS — E782 Mixed hyperlipidemia: Secondary | ICD-10-CM | POA: Diagnosis not present

## 2018-12-10 DIAGNOSIS — I5022 Chronic systolic (congestive) heart failure: Secondary | ICD-10-CM

## 2018-12-10 DIAGNOSIS — E785 Hyperlipidemia, unspecified: Secondary | ICD-10-CM

## 2018-12-10 NOTE — Progress Notes (Signed)
Virtual Visit via Video Note   This visit type was conducted due to national recommendations for restrictions regarding the COVID-19 Pandemic (e.g. social distancing) in an effort to limit this patient's exposure and mitigate transmission in our community.  Due to her co-morbid illnesses, this patient is at least at moderate risk for complications without adequate follow up.  This format is felt to be most appropriate for this patient at this time.  All issues noted in this document were discussed and addressed.  A limited physical exam was performed with this format.  Please refer to the patient's chart for her consent to telehealth for [facility].   Date:  12/10/2018   ID:  Pam [name], DOB Jun 05, 1951, MRN [id number]  Patient Location: Home Provider Location: Office  PCP:  Jamesetta Orleans, PA-C  Cardiologist:  Lauree Chandler, MD  Electrophysiologist:  None   Evaluation Performed:  Follow-Up Visit  Chief Complaint:  Follow up- CAD  History of Present Illness:    Pam [name] is a 68 y.o. female with history of CAD, ischemic cardiomyopathy, chronic systolic CHF, HTN, HLD and hypothyroidism who is being seen today by virtual e-visit due to the [place] pandemic. She had been followed by Dr. Saunders Revel. I met her in November 2019. She was admitted to [facility] in December 2017 with a NSTEMI. She was found to have a chronic occlusion of the mid LAD and severe, calcified stenosis in the proximal Circumflex.  LVEF was 30% by LV gram and 35-40% by echo. Cardiac MRI December 2017 with non-viability of the anterior wall in the LAD territory. LVEF=38% by cardiac MRI. The Circumflex stenosis was treated with orbital atherectomy and stenting using a drug eluting stent in a staged procedure in January 2018.  The patient denies palpitations, dizziness, near syncope or syncope. No lower extremity edema. Rare chest pains.   The patient does not have symptoms concerning for COVID-19 infection (fever,  chills, cough, or new shortness of breath).    Past Medical History:  Diagnosis Date  . Abnormal EKG   . Alcoholic intoxication without complication ([place])   . Allergy   . Cardiomyopathy, ischemic   . Cataract   . Chest pain 08/29/2016  . CHF (congestive heart failure) ([place])   . Chronic systolic heart failure ([place]) 08/29/2016  . Coronary artery disease of native artery of native heart with stable angina pectoris ([place])   . Facial laceration   . Fall 07/07/2016  . Hyperlipidemia   . Hypertension 03/03/2017  . Hyponatremia   . Hypothyroidism 07/08/2016  . NSTEMI (non-ST elevated myocardial infarction) ([place])   . Syncope   . Thyroid disease   . Unstable angina ([place]) 08/29/2016   Past Surgical History:  Procedure Laterality Date  . CARDIAC CATHETERIZATION N/A 07/08/2016   Procedure: Left Heart Cath and Coronary Angiography;  Surgeon: [name], MD;  Location: [place] [facility];  Service: Cardiovascular;  Laterality: N/A;  . CARDIAC CATHETERIZATION N/A 08/29/2016   Procedure: Coronary Atherectomy;  Surgeon: [name], MD;  Location: [place] [facility];  Service: Cardiovascular;  Laterality: N/A;  . CARDIAC CATHETERIZATION N/A 08/29/2016   Procedure: Coronary Stent Intervention;  Surgeon: [name], MD;  Location: [place] [facility];  Service: Cardiovascular;  Laterality: N/A;  . CATARACT EXTRACTION, BILATERAL    . VAGINAL HYSTERECTOMY       Current Meds  Medication Sig  . acetaminophen (TYLENOL) 650 MG CR tablet Take 650 mg by mouth every 8 (eight) hours  as needed for pain (headache).  Marland Kitchen atorvastatin (LIPITOR) 80 MG tablet TAKE 1 TABLET(80 MG) BY MOUTH DAILY  . carvedilol (COREG) 6.25 MG tablet TAKE 1 TABLET(6.25 MG) BY MOUTH TWICE DAILY  . clopidogrel (PLAVIX) 75 MG tablet TAKE 1 TABLET(75 MG) BY MOUTH DAILY WITH BREAKFAST  . furosemide (LASIX) 20 MG tablet TAKE ONE TABLET BY MOUTH EVERY MONDAY, WEDNESDAY AND FRIDAY  . levothyroxine (SYNTHROID, LEVOTHROID) 25 MCG tablet  Take 37.5 mcg by mouth daily before breakfast.  . losartan (COZAAR) 50 MG tablet TAKE 1 TABLET(50 MG) BY MOUTH DAILY  . nitroGLYCERIN (NITROSTAT) 0.4 MG SL tablet Place 1 tablet (0.4 mg total) under the tongue every 5 (five) minutes x 3 doses as needed for chest pain.  Marland Kitchen RANEXA 1000 MG SR tablet TAKE 1 TABLET BY MOUTH TWICE DAILY     Allergies:   Lisinopril and Spironolactone   Social History   Tobacco Use  . Smoking status: Former Smoker    Packs/day: 1.00    Years: 50.00    Pack years: 50.00    Types: Cigarettes    Last attempt to quit: 07/07/2016    Years since quitting: 2.4  . Smokeless tobacco: Never Used  Substance Use Topics  . Alcohol use: Yes    Comment: rarely-mixed drink once in a blue moon  . Drug use: No     Family Hx: The patient's family history includes Heart disease in her mother and sister; Kidney failure in her mother. There is no history of Colon cancer, Esophageal cancer, Rectal cancer, or Stomach cancer.  ROS:   Please see the history of present illness.    All other systems reviewed and are negative.   Prior CV studies:   The following studies were reviewed today:   Labs/Other Tests and Data Reviewed:    EKG:  No ECG reviewed.  Recent Labs: 03/01/2018: ALT 15   Recent Lipid Panel Lab Results  Component Value Date/Time   CHOL 153 03/01/2018 08:01 AM   TRIG 41 03/01/2018 08:01 AM   HDL 93 03/01/2018 08:01 AM   CHOLHDL 1.6 03/01/2018 08:01 AM   CHOLHDL 2.0 07/07/2016 07:14 PM   LDLCALC 52 03/01/2018 08:01 AM    Wt Readings from Last 3 Encounters:  12/10/18 124 lb (56.2 kg)  07/16/18 122 lb (55.3 kg)  06/13/18 122 lb 2 oz (55.4 kg)     Objective:    Vital Signs:  Ht '5\' 2"'$  (1.575 m)   Wt 124 lb (56.2 kg)   BMI 22.68 kg/m    No exam Phone visit  ASSESSMENT & PLAN:    1. CAD with stable angina: She has no chest pain. Will continue Plavix, statin, beta blocker and Ranexa.    2. Chronic systolic CHF/Ischemic cardiomyopathy: Weight  is stable. No lower extremity edema. Will continue Coreg and Losartan. Will not start Entresto due to soft BP in the past. She cannot check her BP today. Continue Lasix  3. Hyperlipidemia: Lipids are well controlled. Continue statin and Zetia.    4. Tobacco abuse, in remission: She stopped smoking in 2017.   COVID-19 Education: The signs and symptoms of COVID-19 were discussed with the patient and how to seek care for testing (follow up with PCP or arrange E-visit).  The importance of social distancing was discussed today.  Time:   Today, I have spent 16 minutes with the patient with telehealth technology discussing the above problems.     Medication Adjustments/Labs and Tests Ordered: Current medicines are  reviewed at length with the patient today.  Concerns regarding medicines are outlined above.   Tests Ordered: No orders of the defined types were placed in this encounter.   Medication Changes: No orders of the defined types were placed in this encounter.   Disposition:  Follow up in 6 month(s)  Signed, Lauree Chandler, MD  12/10/2018 9:15 AM    [place]

## 2018-12-10 NOTE — Patient Instructions (Signed)

## 2019-01-17 ENCOUNTER — Other Ambulatory Visit: Payer: Self-pay | Admitting: Cardiovascular Disease

## 2019-01-17 DIAGNOSIS — I5022 Chronic systolic (congestive) heart failure: Secondary | ICD-10-CM

## 2019-01-17 DIAGNOSIS — I25119 Atherosclerotic heart disease of native coronary artery with unspecified angina pectoris: Secondary | ICD-10-CM

## 2019-01-17 MED ORDER — CARVEDILOL 6.25 MG PO TABS: ORAL_TABLET | ORAL | 3 refills | Status: DC

## 2019-03-11 ENCOUNTER — Telehealth: Payer: Self-pay | Admitting: Cardiovascular Disease

## 2019-03-11 ENCOUNTER — Other Ambulatory Visit: Payer: Self-pay | Admitting: *Deleted

## 2019-03-11 DIAGNOSIS — I5022 Chronic systolic (congestive) heart failure: Secondary | ICD-10-CM

## 2019-03-11 MED ORDER — RANOLAZINE ER 1000 MG PO TB12: 1000.0000 mg | ORAL_TABLET | Freq: Two times a day (BID) | ORAL | 2 refills | Status: DC

## 2019-03-11 NOTE — Telephone Encounter (Signed)
°*  STAT* If patient is at the pharmacy, call can be transferred to refill team.   1. Which medications need to be refilled? (please list name of each medication and dose if known) Ranolazine 1000 mg  2. Which pharmacy/location (including street and city if local pharmacy) is medication to be sent to? [facility] [phone]  3. Do they need a 30 day or 90 day supply? 90 patient not sure if should still be taken this medication.

## 2019-03-18 ENCOUNTER — Other Ambulatory Visit: Payer: Self-pay | Admitting: Cardiovascular Disease

## 2019-03-18 MED ORDER — LOSARTAN POTASSIUM 50 MG PO TABS: ORAL_TABLET | ORAL | 2 refills | Status: DC

## 2019-06-09 NOTE — Progress Notes (Signed)
Chief Complaint  Patient presents with  . Follow-up    CAD   History of Present Illness: 68 yo female with history of CAD, ischemic cardiomyopathy, chronic systolic CHF, HTN, HLD and hypothyroidism who is here today for cardiac follow up. She had been followed by Dr. Saunders Revel. I met her in November 2019. She was admitted to [facility] in December 2017 with a NSTEMI. She was found to have a chronic occlusion of the mid LAD and severe, calcified stenosis in the proximal Circumflex.LVEF was 30% by LV gram and 35-40% by echo. Cardiac MRI December 2017 with non-viability of the anterior wall in the LAD territory. LVEF=38% by cardiac MRI. The Circumflex stenosiswas treated with orbital atherectomy and stenting using a drug eluting stent in a staged procedure in January 2018.   She is here today for follow up. She has rare chest pains. She has dyspnea over the past six months with exertion. No weight gain. No palpitations, lower extremity edema, orthopnea, PND, dizziness, near syncope or syncope.   Primary Care Physician: Drosinis, [name], PA-C  Past Medical History:  Diagnosis Date  . Abnormal EKG   . Alcoholic intoxication without complication ([place])   . Allergy   . Cardiomyopathy, ischemic   . Cataract   . Chest pain 08/29/2016  . CHF (congestive heart failure) ([place])   . Chronic systolic heart failure ([place]) 08/29/2016  . Coronary artery disease of native artery of native heart with stable angina pectoris ([place])   . Facial laceration   . Fall 07/07/2016  . Hyperlipidemia   . Hypertension 03/03/2017  . Hyponatremia   . Hypothyroidism 07/08/2016  . NSTEMI (non-ST elevated myocardial infarction) ([place])   . Syncope   . Thyroid disease   . Unstable angina ([place]) 08/29/2016    Past Surgical History:  Procedure Laterality Date  . CARDIAC CATHETERIZATION N/A 07/08/2016   Procedure: Left Heart Cath and Coronary Angiography;  Surgeon: [name], MD;  Location: [place] [facility];  Service:  Cardiovascular;  Laterality: N/A;  . CARDIAC CATHETERIZATION N/A 08/29/2016   Procedure: Coronary Atherectomy;  Surgeon: [name], MD;  Location: [place] [facility];  Service: Cardiovascular;  Laterality: N/A;  . CARDIAC CATHETERIZATION N/A 08/29/2016   Procedure: Coronary Stent Intervention;  Surgeon: [name], MD;  Location: [place] [facility];  Service: Cardiovascular;  Laterality: N/A;  . CATARACT EXTRACTION, BILATERAL    . VAGINAL HYSTERECTOMY      Current Outpatient Medications  Medication Sig Dispense Refill  . acetaminophen (TYLENOL) 650 MG CR tablet Take 650 mg by mouth every 8 (eight) hours as needed for pain (headache).    Marland Kitchen atorvastatin (LIPITOR) 80 MG tablet TAKE 1 TABLET(80 MG) BY MOUTH DAILY 90 tablet 1  . carvedilol (COREG) 6.25 MG tablet TAKE 1 TABLET(6.25 MG) BY MOUTH TWICE DAILY 180 tablet 3  . clopidogrel (PLAVIX) 75 MG tablet TAKE 1 TABLET(75 MG) BY MOUTH DAILY WITH BREAKFAST 90 tablet 3  . furosemide (LASIX) 20 MG tablet TAKE ONE TABLET BY MOUTH EVERY MONDAY, WEDNESDAY AND FRIDAY 45 tablet 3  . levothyroxine (SYNTHROID, LEVOTHROID) 25 MCG tablet Take 37.5 mcg by mouth daily before breakfast.    . losartan (COZAAR) 50 MG tablet TAKE 1 TABLET(50 MG) BY MOUTH DAILY 90 tablet 2  . nitroGLYCERIN (NITROSTAT) 0.4 MG SL tablet Place 1 tablet (0.4 mg total) under the tongue every 5 (five) minutes x 3 doses as needed for chest pain. 75 tablet 2  . ranolazine (RANEXA) 1000 MG SR  tablet Take 1 tablet (1,000 mg total) by mouth 2 (two) times daily. 180 tablet 2   No current facility-administered medications for this visit.     Allergies  Allergen Reactions  . Lisinopril     Dry cough  . Spironolactone Other (See Comments)    Low sodium    Social History   Socioeconomic History  . Marital status: Married    Spouse name: Not on file  . Number of children: 2  . Years of education: Not on file  . Highest education level: Not on file  Occupational History  .  Occupation: Retired-worked for post [employer]  Social Needs  . Financial resource strain: Not on file  . Food insecurity    Worry: Not on file    Inability: Not on file  . Transportation needs    Medical: Not on file    Non-medical: Not on file  Tobacco Use  . Smoking status: Former Smoker    Packs/day: 1.00    Years: 50.00    Pack years: 50.00    Types: Cigarettes    Quit date: 07/07/2016    Years since quitting: 2.9  . Smokeless tobacco: Never Used  Substance and Sexual Activity  . Alcohol use: Yes    Comment: rarely-mixed drink once in a blue moon  . Drug use: No  . Sexual activity: Not on file  Lifestyle  . Physical activity    Days per week: Not on file    Minutes per session: Not on file  . Stress: Not on file  Relationships  . Social Herbalist on phone: Not on file    Gets together: Not on file    Attends religious service: Not on file    Active member of club or organization: Not on file    Attends meetings of clubs or organizations: Not on file    Relationship status: Not on file  . Intimate partner violence    Fear of current or ex partner: Not on file    Emotionally abused: Not on file    Physically abused: Not on file    Forced sexual activity: Not on file  Other Topics Concern  . Not on file  Social History Narrative  . Not on file    Family History  Problem Relation Age of Onset  . Kidney failure Mother   . Heart disease Mother        CHF  . Heart disease Sister   . Colon cancer Neg Hx   . Esophageal cancer Neg Hx   . Rectal cancer Neg Hx   . Stomach cancer Neg Hx     Review of Systems:  As stated in the HPI and otherwise negative.   BP 140/64   Pulse 61   Ht '5\' 2"'$  (1.575 m)   Wt 122 lb 12.8 oz (55.7 kg)   SpO2 99%   BMI 22.46 kg/m   Physical Examination: General: Well developed, well nourished, NAD  HEENT: OP clear, mucus membranes moist  SKIN: warm, dry. No rashes. Neuro: No focal deficits  Musculoskeletal: Muscle  strength 5/5 all ext  Psychiatric: Mood and affect normal  Neck: No JVD, no carotid bruits, no thyromegaly, no lymphadenopathy.  Lungs:Clear bilaterally, no wheezes, rhonci, crackles Cardiovascular: Regular rate and rhythm. No murmurs, gallops or rubs. Abdomen:Soft. Bowel sounds present. Non-tender.  Extremities: No lower extremity edema. Pulses are 2 + in the bilateral DP/PT.  EKG:  EKG is ordered today. The ekg  ordered today demonstrates NSR, rate 61 bpm. T wave abnormality, unchanged.   Recent Labs: No results found for requested labs within last 8760 hours.   Lipid Panel    Component Value Date/Time   CHOL 153 03/01/2018 0801   TRIG 41 03/01/2018 0801   HDL 93 03/01/2018 0801   CHOLHDL 1.6 03/01/2018 0801   CHOLHDL 2.0 07/07/2016 1914   VLDL 17 07/07/2016 1914   LDLCALC 52 03/01/2018 0801     Wt Readings from Last 3 Encounters:  06/10/19 122 lb 12.8 oz (55.7 kg)  12/10/18 124 lb (56.2 kg)  07/16/18 122 lb (55.3 kg)     Other studies Reviewed: Additional studies/ records that were reviewed today include: . Review of the above records demonstrates:    Assessment and Plan:   1.CAD with stable angina: No chest pain. Continue Plavix, beta blocker, statin and Ranexa.     2. Chronic systolic CHF/Ischemic cardiomyopathy: No evidence of volume overload. Weight is stable. Continue Coreg and Losartan. Delene Loll has not been started due to soft BP in the past. Continue Lasix. Echo now to assess LV systolic function  3. Hyperlipidemia: LDL near goal in October 2020 in primary care. Continue statin and Zetia.    4. Tobacco abuse, in remission: She stopped smoking in 2017.  5. Dyspnea: No evidence of volume overload. No murmurs on exam. She no longer smokes. I suspect she likely has some degree of underlying lung disease given her heaving smoking history. Will arrange an echo now to assess LVEF. I would anticipate it to be no better than 35% with her known anterior wall scar  post MI  Current medicines are reviewed at length with the patient today.  The patient does not have concerns regarding medicines.  The following changes have been made:  no change  Labs/ tests ordered today include:   Orders Placed This Encounter  Procedures  . EKG 12-Lead  . ECHOCARDIOGRAM COMPLETE   Disposition:   FU with me in 2-3 months  Signed, Lauree Chandler, MD 06/10/2019 9:42 AM    [facility] [place], [place], [place]  [address] Phone: [phone]; Fax: [phone]

## 2019-06-10 ENCOUNTER — Ambulatory Visit: Payer: Federal, State, Local not specified - PPO | Admitting: Cardiovascular Disease

## 2019-06-10 ENCOUNTER — Encounter (INDEPENDENT_AMBULATORY_CARE_PROVIDER_SITE_OTHER): Payer: Self-pay

## 2019-06-10 ENCOUNTER — Encounter: Payer: Self-pay | Admitting: Cardiovascular Disease

## 2019-06-10 ENCOUNTER — Other Ambulatory Visit: Payer: Self-pay

## 2019-06-10 VITALS — BP 140/64 | HR 61 | Ht 62.0 in | Wt 122.8 lb

## 2019-06-10 DIAGNOSIS — E785 Hyperlipidemia, unspecified: Secondary | ICD-10-CM

## 2019-06-10 DIAGNOSIS — I255 Ischemic cardiomyopathy: Secondary | ICD-10-CM | POA: Diagnosis not present

## 2019-06-10 DIAGNOSIS — I25118 Atherosclerotic heart disease of native coronary artery with other forms of angina pectoris: Secondary | ICD-10-CM | POA: Diagnosis not present

## 2019-06-10 DIAGNOSIS — I5022 Chronic systolic (congestive) heart failure: Secondary | ICD-10-CM

## 2019-06-10 MED ORDER — NITROGLYCERIN 0.4 MG SL SUBL: 0.4000 mg | SUBLINGUAL_TABLET | SUBLINGUAL | 2 refills | Status: DC | PRN

## 2019-06-10 MED ORDER — NITROGLYCERIN 0.4 MG SL SUBL: 0.4000 mg | SUBLINGUAL_TABLET | SUBLINGUAL | 3 refills | Status: DC | PRN

## 2019-06-10 NOTE — Patient Instructions (Signed)
Medication Instructions:  No changes *If you need a refill on your cardiac medications before your next appointment, please call your pharmacy*  Lab Work: none If you have labs (blood work) drawn today and your tests are completely normal, you will receive your results only by: Marland Kitchen MyChart Message (if you have MyChart) OR . A paper copy in the mail If you have any lab test that is abnormal or we need to change your treatment, we will call you to review the results.  Testing/Procedures: Your physician has requested that you have an echocardiogram. Echocardiography is a painless test that uses sound waves to create images of your heart. It provides your doctor with information about the size and shape of your heart and how well your heart's chambers and valves are working. This procedure takes approximately one hour. There are no restrictions for this procedure.   Follow-Up: At [facility], you and your health needs are our priority.  As part of our continuing mission to provide you with exceptional heart care, we have created designated Provider Care Teams.  These Care Teams include your primary Cardiologist (physician) and Advanced Practice Providers (APPs -  Physician Assistants and Nurse Practitioners) who all work together to provide you with the care you need, when you need it.  Your next appointment:   2-3 months  The format for your next appointment:   In Person  Provider:   Lauree Chandler, MD  Other Instructions

## 2019-06-19 ENCOUNTER — Ambulatory Visit (HOSPITAL_COMMUNITY): Payer: Federal, State, Local not specified - PPO | Attending: Cardiovascular Disease

## 2019-06-19 ENCOUNTER — Other Ambulatory Visit: Payer: Self-pay | Admitting: *Deleted

## 2019-06-19 ENCOUNTER — Other Ambulatory Visit: Payer: Self-pay

## 2019-06-19 DIAGNOSIS — I5022 Chronic systolic (congestive) heart failure: Secondary | ICD-10-CM

## 2019-06-19 DIAGNOSIS — I255 Ischemic cardiomyopathy: Secondary | ICD-10-CM

## 2019-06-19 DIAGNOSIS — I25118 Atherosclerotic heart disease of native coronary artery with other forms of angina pectoris: Secondary | ICD-10-CM

## 2019-06-19 DIAGNOSIS — E785 Hyperlipidemia, unspecified: Secondary | ICD-10-CM | POA: Diagnosis not present

## 2019-06-19 MED ORDER — PERFLUTREN LIPID MICROSPHERE
1.0000 mL | INTRAVENOUS | Status: AC | PRN
  Administered 2019-06-19: 3 mL via INTRAVENOUS

## 2019-06-25 ENCOUNTER — Ambulatory Visit: Payer: Federal, State, Local not specified - PPO | Admitting: Cardiology

## 2019-06-25 ENCOUNTER — Other Ambulatory Visit: Payer: Self-pay

## 2019-06-25 ENCOUNTER — Encounter: Payer: Self-pay | Admitting: Cardiology

## 2019-06-25 VITALS — BP 162/74 | HR 65 | Ht 62.0 in | Wt 123.2 lb

## 2019-06-25 DIAGNOSIS — I255 Ischemic cardiomyopathy: Secondary | ICD-10-CM | POA: Diagnosis not present

## 2019-06-25 MED ORDER — LOSARTAN POTASSIUM 100 MG PO TABS: 100.0000 mg | ORAL_TABLET | Freq: Every day | ORAL | 1 refills | Status: DC

## 2019-06-25 NOTE — Progress Notes (Signed)
Electrophysiology Office Note   Date:  06/25/2019   ID:  Pam [name], DOB 01/26/1951, MRN [id number]  PCP:  Drosinis, [name], PA-C  Cardiologist:  Angelena Form Primary Electrophysiologist:  Pellegrino Kennard Meredith Leeds, MD    Chief Complaint: CHF   History of Present Illness: Pam [name] is a 68 y.o. female who is being seen today for the evaluation of CHF at the request of Burnell Blanks*. Presenting today for electrophysiology evaluation.  She has a history of coronary artery disease, ischemic cardiomyopathy, chronic systolic heart failure, hypertension, hyperlipidemia.  She had a non-STEMI December 2017, found to have a chronic total occlusion of the mid LAD.  She had severe calcific stenosis of the circumflex.  Cardiac MRI showed nonviability of the LAD territory.  Ejection fraction was 38% at the time.  She had stenting January 2018 of her circumflex.  Today, she denies symptoms of palpitations, chest pain, shortness of breath, orthopnea, PND, lower extremity edema, claudication, dizziness, presyncope, syncope, bleeding, or neurologic sequela. The patient is tolerating medications without difficulties.    Past Medical History:  Diagnosis Date  . Abnormal EKG   . Alcoholic intoxication without complication ([place])   . Allergy   . Cardiomyopathy, ischemic   . Cataract   . Chest pain 08/29/2016  . CHF (congestive heart failure) ([place])   . Chronic systolic heart failure ([place]) 08/29/2016  . Coronary artery disease of native artery of native heart with stable angina pectoris ([place])   . Facial laceration   . Fall 07/07/2016  . Hyperlipidemia   . Hypertension 03/03/2017  . Hyponatremia   . Hypothyroidism 07/08/2016  . NSTEMI (non-ST elevated myocardial infarction) ([place])   . Syncope   . Thyroid disease   . Unstable angina ([place]) 08/29/2016   Past Surgical History:  Procedure Laterality Date  . CARDIAC CATHETERIZATION N/A 07/08/2016   Procedure: Left Heart Cath and Coronary Angiography;   Surgeon: [name], MD;  Location: [place] [facility];  Service: Cardiovascular;  Laterality: N/A;  . CARDIAC CATHETERIZATION N/A 08/29/2016   Procedure: Coronary Atherectomy;  Surgeon: [name], MD;  Location: [place] [facility];  Service: Cardiovascular;  Laterality: N/A;  . CARDIAC CATHETERIZATION N/A 08/29/2016   Procedure: Coronary Stent Intervention;  Surgeon: [name], MD;  Location: [place] [facility];  Service: Cardiovascular;  Laterality: N/A;  . CATARACT EXTRACTION, BILATERAL    . VAGINAL HYSTERECTOMY       Current Outpatient Medications  Medication Sig Dispense Refill  . acetaminophen (TYLENOL) 650 MG CR tablet Take 650 mg by mouth every 8 (eight) hours as needed for pain (headache).    Marland Kitchen atorvastatin (LIPITOR) 80 MG tablet TAKE 1 TABLET(80 MG) BY MOUTH DAILY 90 tablet 1  . carvedilol (COREG) 6.25 MG tablet TAKE 1 TABLET(6.25 MG) BY MOUTH TWICE DAILY 180 tablet 3  . clopidogrel (PLAVIX) 75 MG tablet TAKE 1 TABLET(75 MG) BY MOUTH DAILY WITH BREAKFAST 90 tablet 3  . furosemide (LASIX) 20 MG tablet TAKE ONE TABLET BY MOUTH EVERY MONDAY, WEDNESDAY AND FRIDAY 45 tablet 3  . levothyroxine (SYNTHROID, LEVOTHROID) 25 MCG tablet Take 37.5 mcg by mouth daily before breakfast.    . nitroGLYCERIN (NITROSTAT) 0.4 MG SL tablet Place 1 tablet (0.4 mg total) under the tongue every 5 (five) minutes x 3 doses as needed for chest pain. 75 tablet 2  . ranolazine (RANEXA) 1000 MG SR tablet Take 1 tablet (1,000 mg total) by mouth 2 (two) times daily. 180 tablet 2  . losartan (  COZAAR) 100 MG tablet Take 1 tablet (100 mg total) by mouth daily. 90 tablet 1   No current facility-administered medications for this visit.     Allergies:   Lisinopril and Spironolactone   Social History:  The patient  reports that she quit smoking about 2 years ago. Her smoking use included cigarettes. She has a 50.00 pack-year smoking history. She has never used smokeless tobacco. She reports current  alcohol use. She reports that she does not use drugs.   Family History:  The patient's family history includes Heart disease in her mother and sister; Kidney failure in her mother.    ROS:  Please see the history of present illness.   Otherwise, review of systems is positive for none.   All other systems are reviewed and negative.    PHYSICAL EXAM: VS:  BP (!) 162/74   Pulse 65   Ht 5\' 2"  (1.575 m)   Wt 123 lb 3.2 oz (55.9 kg)   SpO2 98%   BMI 22.53 kg/m  , BMI Body mass index is 22.53 kg/m. GEN: Well nourished, well developed, in no acute distress  HEENT: normal  Neck: no JVD, carotid bruits, or masses Cardiac: RRR; no murmurs, rubs, or gallops,no edema  Respiratory:  clear to auscultation bilaterally, normal work of breathing GI: soft, nontender, nondistended, + BS MS: no deformity or atrophy  Skin: warm and dry Neuro:  Strength and sensation are intact Psych: euthymic mood, full affect  EKG:  EKG is ordered today. Personal review of the ekg ordered shows sinus rhythm, rate 61, anterior T wave inversions  Recent Labs: No results found for requested labs within last 8760 hours.    Lipid Panel     Component Value Date/Time   CHOL 153 03/01/2018 0801   TRIG 41 03/01/2018 0801   HDL 93 03/01/2018 0801   CHOLHDL 1.6 03/01/2018 0801   CHOLHDL 2.0 07/07/2016 1914   VLDL 17 07/07/2016 1914   LDLCALC 52 03/01/2018 0801     Wt Readings from Last 3 Encounters:  06/25/19 123 lb 3.2 oz (55.9 kg)  06/10/19 122 lb 12.8 oz (55.7 kg)  12/10/18 124 lb (56.2 kg)      Other studies Reviewed: Additional studies/ records that were reviewed today include: TTE 06/19/19  Review of the above records today demonstrates:   1. Left ventricular ejection fraction, by visual estimation, is 25 to 30%. The left ventricle has severely decreased function. Left ventricular septal wall thickness was normal. There is no left ventricular hypertrophy.  2. Severely dilated left ventricular  internal cavity size.  3. Mid and apical segments severely hypokinetic Preserved basal funciton.  4. Global right ventricle has normal systolic function.The right ventricular size is normal. No increase in right ventricular wall thickness.  5. Left atrial size was mildly dilated.  6. Right atrial size was normal.  7. The mitral valve is normal in structure. Mild mitral valve regurgitation. No evidence of mitral stenosis.  8. The tricuspid valve is normal in structure. Tricuspid valve regurgitation is not demonstrated.  9. The aortic valve is normal in structure. Aortic valve regurgitation is not visualized. No evidence of aortic valve sclerosis or stenosis. 10. The pulmonic valve was normal in structure. Pulmonic valve regurgitation is not visualized. 11. Mildly elevated pulmonary artery systolic pressure. 12. The inferior vena cava is normal in size with greater than 50% respiratory variability, suggesting right atrial pressure of 3 mmHg.   ASSESSMENT AND PLAN:  1.  Chronic systolic heart  failure due to ischemic cardiomyopathy: Currently on losartan and Coreg.  Has not been able to tolerate Entresto.  Echo shows reduced LV systolic function.  Due to that, she would benefit from an ICD.  Risks and benefits were discussed and include bleeding, tamponade, infection, pneumothorax.  She understands these risks and is agreed to the procedure.  2.  Coronary artery disease: No current chest pain.  Continue Plavix, beta-blocker, statin, Ranexa.  3.  Hyperlipidemia: Currently on statin and Zetia per primary cardiology.  4.  Hypertension: Blood pressure significantly elevated today and has been elevated on prior checks.  Loyd Salvador increase losartan to 100 mg.  Current medicines are reviewed at length with the patient today.   The patient does not have concerns regarding her medicines.  The following changes were made today: Increase losartan  Labs/ tests ordered today include:  No orders of the defined  types were placed in this encounter.  Discussed with referring cardiologist  Disposition:   FU with Gaston Dase 3 months  Signed, Niala Stcharles Meredith Leeds, MD  06/25/2019 9:23 AM     [facility] [address] [place] [address] [phone] (office) [phone] (fax)

## 2019-06-25 NOTE — Patient Instructions (Addendum)
Medication Instructions:  Your physician has recommended you make the following change in your medication:  1. INCREASE Losartan to 100 mg daily  * If you need a refill on your cardiac medications before your next appointment, please call your pharmacy.   Labwork: None ordered  Testing/Procedures: Your physician has recommended that you have a defibrillator inserted. An implantable cardioverter defibrillator (ICD) is a small device that is placed in your chest or, in rare cases, your abdomen. This device uses electrical pulses or shocks to help control life-threatening, irregular heartbeats that could lead the heart to suddenly stop beating (sudden cardiac arrest). Leads are attached to the ICD that goes into your heart. This is done in the hospital and usually requires an overnight stay. Please see the instruction sheet given to you today for more information.  Follow-Up: Call the office if you would like to proceed with defibrillator implant  *Please note that any paperwork needing to be filled out by the provider will need to be addressed at the front desk prior to seeing the provider. Please note that any FMLA, disability or other documents regarding health condition is subject to a $25.00 charge that must be received prior to completion of paperwork in the form of a money order or check.  Thank you for choosing [facility]!!   Trinidad Curet, RN [phone]  Any Other Special Instructions Will Be Listed Below (If Applicable).    Cardioverter Defibrillator Implantation  An implantable cardioverter defibrillator (ICD) is a small device that is placed under the skin in the chest or abdomen. An ICD consists of a battery, a small computer (pulse generator), and wires (leads) that go into the heart. An ICD is used to detect and correct two types of dangerous irregular heartbeats (arrhythmias):  A rapid heart rhythm (tachycardia).  An arrhythmia in which the lower chambers of the  heart (ventricles) contract in an uncoordinated way (fibrillation). When an ICD detects tachycardia, it sends a low-energy shock to the heart to restore the heartbeat to normal (cardioversion). This signal is usually painless. If cardioversion does not work or if the ICD detects fibrillation, it delivers a high-energy shock to the heart (defibrillation) to restart the heart. This shock may feel like a strong jolt in the chest. Your health care provider may prescribe an ICD if:  You have had an arrhythmia that originated in the ventricles.  Your heart has been damaged by a disease or heart condition. Sometimes, ICDs are programmed to act as a device called a pacemaker. Pacemakers can be used to treat a slow heartbeat (bradycardia) or tachycardia by taking over the heart rate with electrical impulses. Tell a health care provider about:  Any allergies you have.  All medicines you are taking, including vitamins, herbs, eye drops, creams, and over-the-counter medicines.  Any problems you or family members have had with anesthetic medicines.  Any blood disorders you have.  Any surgeries you have had.  Any medical conditions you have.  Whether you are pregnant or may be pregnant. What are the risks? Generally, this is a safe procedure. However, problems may occur, including:  Swelling, bleeding, or bruising.  Infection.  Blood clots.  Damage to other structures or organs, such as nerves, blood vessels, or the heart.  Allergic reactions to medicines used during the procedure. What happens before the procedure? Staying hydrated Follow instructions from your health care provider about hydration, which may include:  Up to 2 hours before the procedure - you may continue to  drink clear liquids, such as water, clear fruit juice, black coffee, and plain tea. Eating and drinking restrictions Follow instructions from your health care provider about eating and drinking, which may include:  8  hours before the procedure - stop eating heavy meals or foods such as meat, fried foods, or fatty foods.  6 hours before the procedure - stop eating light meals or foods, such as toast or cereal.  6 hours before the procedure - stop drinking milk or drinks that contain milk.  2 hours before the procedure - stop drinking clear liquids. Medicine Ask your health care provider about:  Changing or stopping your normal medicines. This is important if you take diabetes medicines or blood thinners.  Taking medicines such as aspirin and ibuprofen. These medicines can thin your blood. Do not take these medicines before your procedure if your doctor tells you not to. Tests  You may have blood tests.  You may have a test to check the electrical signals in your heart (electrocardiogram, ECG).  You may have imaging tests, such as a chest X-ray. General instructions  For 24 hours before the procedure, stop using products that contain nicotine or tobacco, such as cigarettes and e-cigarettes. If you need help quitting, ask your health care provider.  Plan to have someone take you home from the hospital or clinic.  You may be asked to shower with a germ-killing soap. What happens during the procedure?  To reduce your risk of infection: ? Your health care team will wash or sanitize their hands. ? Your skin will be washed with soap. ? Hair may be removed from the surgical area.  Small monitors will be put on your body. They will be used to check your heart, blood pressure, and oxygen level.  An IV tube will be inserted into one of your veins.  You will be given one or more of the following: ? A medicine to help you relax (sedative). ? A medicine to numb the area (local anesthetic). ? A medicine to make you fall asleep (general anesthetic).  Leads will be guided through a blood vessel into your heart and attached to your heart muscles. Depending on the ICD, the leads may go into one ventricle  or they may go into both ventricles and into an upper chamber of the heart. An X-ray machine (fluoroscope) will be usedto help guide the leads.  A small incision will be made to create a deep pocket under your skin.  The pulse generator will be placed into the pocket.  The ICD will be tested.  The incision will be closed with stitches (sutures), skin glue, or staples.  A bandage (dressing) will be placed over the incision. This procedure may vary among health care providers and hospitals. What happens after the procedure?  Your blood pressure, heart rate, breathing rate, and blood oxygen level will be monitored often until the medicines you were given have worn off.  A chest X-ray will be taken to check that the ICD is in the right place.  You will need to stay in the hospital for 1-2 days so your health care provider can make sure your ICD is working.  Do not drive for 24 hours if you received a sedative. Ask your health care provider when it is safe for you to drive.  You may be given an identification card explaining that you have an ICD. Summary  An implantable cardioverter defibrillator (ICD) is a small device that is placed under the  skin in the chest or abdomen. It is used to detect and correct dangerous irregular heartbeats (arrhythmias).  An ICD consists of a battery, a small computer (pulse generator), and wires (leads) that go into the heart.  When an ICD detects rapid heart rhythm (tachycardia), it sends a low-energy shock to the heart to restore the heartbeat to normal (cardioversion). If cardioversion does not work or if the ICD detects uncoordinated heart contractions (fibrillation), it delivers a high-energy shock to the heart (defibrillation) to restart the heart.  You will need to stay in the hospital for 1-2 days to make sure your ICD is working. This information is not intended to replace advice given to you by your health care provider. Make sure you discuss any  questions you have with your health care provider. Document Released: 04/09/2002 Document Revised: 06/30/2017 Document Reviewed: 07/27/2016 Elsevier Patient Education  2020 [employer].

## 2019-06-26 ENCOUNTER — Other Ambulatory Visit: Payer: Self-pay

## 2019-07-01 ENCOUNTER — Ambulatory Visit: Payer: Federal, State, Local not specified - PPO | Admitting: Endocrinology

## 2019-07-01 ENCOUNTER — Encounter: Payer: Self-pay | Admitting: Endocrinology

## 2019-07-01 ENCOUNTER — Other Ambulatory Visit: Payer: Self-pay

## 2019-07-01 VITALS — BP 140/70 | HR 68 | Ht 62.0 in | Wt 123.6 lb

## 2019-07-01 DIAGNOSIS — M40204 Unspecified kyphosis, thoracic region: Secondary | ICD-10-CM | POA: Diagnosis not present

## 2019-07-01 DIAGNOSIS — E213 Hyperparathyroidism, unspecified: Secondary | ICD-10-CM

## 2019-07-01 DIAGNOSIS — E039 Hypothyroidism, unspecified: Secondary | ICD-10-CM | POA: Diagnosis not present

## 2019-07-01 NOTE — Progress Notes (Signed)
Patient ID: Pam [name], female   DOB: 1951-03-14, 68 y.o.   MRN: [id number]                  Chief complaint: High PTH  History of Present Illness:  Referring PCP: Drosinis, Wynona Dove, PA-C   Review of records show that she has had a high calcium since 05/10/2018 when her calcium was 10.6. Since then calcium has ranged between 10.6-10.8 She has been referred here because of the finding of a PTH of 107 done in 05/2019  Last calcium was 10.8 done on 05/10/19  Lab Results  Component Value Date   CALCIUM 9.6 09/29/2017   CALCIUM 9.5 08/21/2017   CALCIUM 9.9 03/21/2017   CALCIUM 9.9 03/03/2017   CALCIUM 10.2 02/17/2017   CALCIUM 9.1 08/30/2016   CALCIUM 10.0 08/25/2016   CALCIUM 9.3 07/11/2016   CALCIUM 8.8 (L) 07/09/2016    The hypercalcemia is not associated with any low trauma fractures, renal insufficiency, nephrolithiasis, sarcoidosis, known carcinoma Or height loss  She has not had a bone density  25 (OH) Vitamin D level not available  No results found for: VD25OH    Allergies as of 07/01/2019      Reactions   Lisinopril    Dry cough   Spironolactone Other (See Comments)   Low sodium      Medication List       Accurate as of July 01, 2019  2:36 PM. If you have any questions, ask your nurse or doctor.        acetaminophen 650 MG CR tablet Commonly known as: TYLENOL Take 650 mg by mouth every 8 (eight) hours as needed for pain (headache).   atorvastatin 80 MG tablet Commonly known as: LIPITOR TAKE 1 TABLET(80 MG) BY MOUTH DAILY   carvedilol 6.25 MG tablet Commonly known as: COREG TAKE 1 TABLET(6.25 MG) BY MOUTH TWICE DAILY   clopidogrel 75 MG tablet Commonly known as: PLAVIX TAKE 1 TABLET(75 MG) BY MOUTH DAILY WITH BREAKFAST   furosemide 20 MG tablet Commonly known as: LASIX TAKE ONE TABLET BY MOUTH EVERY MONDAY, WEDNESDAY AND FRIDAY   levothyroxine 25 MCG tablet Commonly known as: SYNTHROID Take 37.5 mcg by mouth daily before  breakfast. Take 1.5 tablets(37.5mg  total) by mouth once daily.   losartan 100 MG tablet Commonly known as: COZAAR Take 1 tablet (100 mg total) by mouth daily.   nitroGLYCERIN 0.4 MG SL tablet Commonly known as: NITROSTAT Place 1 tablet (0.4 mg total) under the tongue every 5 (five) minutes x 3 doses as needed for chest pain.   ranolazine 1000 MG SR tablet Commonly known as: Ranexa Take 1 tablet (1,000 mg total) by mouth 2 (two) times daily.       Allergies:  Allergies  Allergen Reactions  . Lisinopril     Dry cough  . Spironolactone Other (See Comments)    Low sodium    Past Medical History:  Diagnosis Date  . Abnormal EKG   . Alcoholic intoxication without complication ([place])   . Allergy   . Cardiomyopathy, ischemic   . Cataract   . Chest pain 08/29/2016  . CHF (congestive heart failure) ([place])   . Chronic systolic heart failure ([place]) 08/29/2016  . Coronary artery disease of native artery of native heart with stable angina pectoris ([place])   . Facial laceration   . Fall 07/07/2016  . Hyperlipidemia   . Hypertension 03/03/2017  . Hyponatremia   . Hypothyroidism 07/08/2016  . NSTEMI (non-ST elevated  myocardial infarction) ([place])   . Syncope   . Thyroid disease   . Unstable angina ([place]) 08/29/2016    Past Surgical History:  Procedure Laterality Date  . CARDIAC CATHETERIZATION N/A 07/08/2016   Procedure: Left Heart Cath and Coronary Angiography;  Surgeon: [name], MD;  Location: [facility];  Service: Cardiovascular;  Laterality: N/A;  . CARDIAC CATHETERIZATION N/A 08/29/2016   Procedure: Coronary Atherectomy;  Surgeon: [name], MD;  Location: [facility];  Service: Cardiovascular;  Laterality: N/A;  . CARDIAC CATHETERIZATION N/A 08/29/2016   Procedure: Coronary Stent Intervention;  Surgeon: [name], MD;  Location: [facility];  Service: Cardiovascular;  Laterality: N/A;  . CATARACT EXTRACTION, BILATERAL    . VAGINAL HYSTERECTOMY       Family History  Problem Relation Age of Onset  . Kidney failure Mother   . Heart disease Mother        CHF  . Heart disease Sister   . Colon cancer Neg Hx   . Esophageal cancer Neg Hx   . Rectal cancer Neg Hx   . Stomach cancer Neg Hx     Social History:  reports that she quit smoking about 2 years ago. Her smoking use included cigarettes. She has a 50.00 pack-year smoking history. She has never used smokeless tobacco. She reports current alcohol use. She reports that she does not use drugs.  Review of Systems  Constitutional: Negative for weight loss and weight gain.  HENT: Negative for trouble swallowing.   Respiratory: Positive for cough and shortness of breath.        She gets occasional cough.  Also has some shortness of breath on exertion  Cardiovascular: Negative for leg swelling.  Gastrointestinal: Positive for constipation.  Endocrine: Negative for fatigue.       She was told about 10 years ago to have a low thyroid level and started on supplement, has not felt any different with taking levothyroxine.  She thinks her thyroid tests are abnormal when she does not take her thyroid supplement  Musculoskeletal: Positive for back pain.       She will sometimes get pain on the right lower back area  Psychiatric/Behavioral: Negative for nervousness.   She is up-to-date with her colonoscopy but not mammogram which was done in 2019  EXAM:  BP 140/70 (BP Location: Left Arm, Patient Position: Sitting, Cuff Size: Normal)   Pulse 68   Ht 5\' 2"  (1.575 m)   Wt 123 lb 9.6 oz (56.1 kg)   SpO2 98%   BMI 22.61 kg/m    GENERAL: Averagely built and nourished  No pallor, clubbing, lymphadenopathy or edema.  Skin:  no rash or pigmentation.  EYES:  Externally normal.   ENT: Exam not done, patient wearing a mask  THYROID:  Not palpable.   HEART:  Normal  S1 and S2; no murmur or click.  CHEST:  Normal shape Lungs:   Vescicular breath sounds heard equally.  No crepitations/  wheeze.  ABDOMEN:  No distention.  Liver and spleen not palpable.  No other mass or tenderness.  NEUROLOGICAL: .Reflexes are brisk bilaterally at biceps but relaxation phase is slightly slow.  SPINE AND JOINTS: Mild dorsal kyphosis without any specific area of tenderness or spine prominence. 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  Have shown her the anatomical model of the thyroid and parathyroid and explained to her the location of the parathyroid glands Also explained that hyperparathyroidism usually from excessive growth and function of one of the 4 glands  Discussed potential  effects of hyperparathyroidism long-term on bone health, kidney stones and kidney function Explained to patient that surgery is indicated only there are symptoms of high calcium, calcium level over 1 point above the normal range or known osteoporosis. Up-to-date handout on hyperparathyroidism given  She will be scheduled for bone density to establish need for any intervention If she has osteoporosis may consider parathyroid exploration Otherwise can be followed periodically with calcium levels  PRIMARY hypothyroidism: She appears to have been asymptomatic from this and is requiring relatively low stable doses of levothyroxine supplement, this is going to be followed by her PCP as before.   Elayne Snare 07/01/2019, 2:36 PM

## 2019-07-02 ENCOUNTER — Other Ambulatory Visit: Payer: Self-pay

## 2019-07-02 ENCOUNTER — Ambulatory Visit (INDEPENDENT_AMBULATORY_CARE_PROVIDER_SITE_OTHER)
Admission: RE | Admit: 2019-07-02 | Discharge: 2019-07-02 | Disposition: A | Discharge status: Home / Self Care | Payer: Federal, State, Local not specified - PPO | Source: Ambulatory Visit | Attending: Endocrinology | Admitting: Endocrinology

## 2019-07-02 ENCOUNTER — Telehealth: Payer: Self-pay | Admitting: Cardiology

## 2019-07-02 DIAGNOSIS — E213 Hyperparathyroidism, unspecified: Secondary | ICD-10-CM

## 2019-07-02 NOTE — Telephone Encounter (Signed)
Patient called to schedule her procedure to have her defib inserted. She stated she will not be home until after 11:30 am today.

## 2019-07-03 ENCOUNTER — Telehealth: Payer: Self-pay | Admitting: Cardiovascular Disease

## 2019-07-03 NOTE — Telephone Encounter (Signed)
Informed pt I will discuss if there are any options for performing procedure this month w/ Dr. Curt Bears. Aware I will call her to arrange procedure for this month/next. Patient verbalized understanding and agreeable to plan.

## 2019-07-03 NOTE — Telephone Encounter (Signed)
Zobro, Selena at 07/03/2019 8:59 AM  Status: Signed    Patient wanting to speak with Wentworth Edelen about scheduling her procedure to have her defib inserted.

## 2019-07-03 NOTE — Telephone Encounter (Signed)
Patient wanting to speak with [name] about scheduling her procedure to have her defib inserted.

## 2019-07-03 NOTE — Telephone Encounter (Signed)
Open encounter from yesterday. Please see 12/1 telephone note for documentation

## 2019-07-05 DIAGNOSIS — E213 Hyperparathyroidism, unspecified: Secondary | ICD-10-CM | POA: Diagnosis not present

## 2019-07-15 ENCOUNTER — Other Ambulatory Visit: Payer: Self-pay

## 2019-07-15 MED ORDER — ALENDRONATE SODIUM 70 MG PO TABS: 70.0000 mg | ORAL_TABLET | ORAL | 3 refills | Status: DC

## 2019-07-18 NOTE — Telephone Encounter (Signed)
Follow Up:  Pt calling to find out if a date have been scheduled for her procedure.

## 2019-07-18 NOTE — Telephone Encounter (Signed)
Pt informed w/ Covid surge we were unable to arrange procedure for this year. 08/28/19 date held.  Pt aware I would follow up soon to go over instructions. Patient verbalized understanding and agreeable to plan.

## 2019-08-12 ENCOUNTER — Other Ambulatory Visit: Payer: Self-pay | Admitting: Cardiovascular Disease

## 2019-08-12 MED ORDER — CLOPIDOGREL BISULFATE 75 MG PO TABS: ORAL_TABLET | ORAL | 3 refills | Status: DC

## 2019-08-15 ENCOUNTER — Telehealth: Payer: Self-pay | Admitting: *Deleted

## 2019-08-15 DIAGNOSIS — I255 Ischemic cardiomyopathy: Secondary | ICD-10-CM

## 2019-08-15 DIAGNOSIS — Z01812 Encounter for preprocedural laboratory examination: Secondary | ICD-10-CM

## 2019-08-15 NOTE — Telephone Encounter (Signed)
ICD implant 1/27. Instructions reviewed w/ Pre procedure labs scheduled for 1/18 Covid screening scheduled for 1/23 Aware office will call to arrange post procedure follow up. Patient verbalized understanding and agreeable to plan.

## 2019-08-19 ENCOUNTER — Other Ambulatory Visit: Payer: Self-pay

## 2019-08-19 ENCOUNTER — Other Ambulatory Visit: Payer: Federal, State, Local not specified - PPO | Admitting: *Deleted

## 2019-08-19 DIAGNOSIS — I255 Ischemic cardiomyopathy: Secondary | ICD-10-CM

## 2019-08-19 DIAGNOSIS — Z01812 Encounter for preprocedural laboratory examination: Secondary | ICD-10-CM

## 2019-08-19 LAB — CBC
Hematocrit: 39 % (ref 34.0–46.6)
Hemoglobin: 13.6 g/dL (ref 11.1–15.9)
MCH: 30.6 pg (ref 26.6–33.0)
MCHC: 34.9 g/dL (ref 31.5–35.7)
MCV: 88 fL (ref 79–97)
Platelets: 333 10*3/uL (ref 150–450)
RBC: 4.45 x10E6/uL (ref 3.77–5.28)
RDW: 14.1 % (ref 11.7–15.4)
WBC: 5.1 10*3/uL (ref 3.4–10.8)

## 2019-08-19 LAB — BASIC METABOLIC PANEL
BUN/Creatinine Ratio: 11 — ABNORMAL LOW (ref 12–28)
BUN: 10 mg/dL (ref 8–27)
CO2: 27 mmol/L (ref 20–29)
Calcium: 10.8 mg/dL — ABNORMAL HIGH (ref 8.7–10.3)
Chloride: 98 mmol/L (ref 96–106)
Creatinine, Ser: 0.88 mg/dL (ref 0.57–1.00)
GFR calc Af Amer: 78 mL/min/{1.73_m2} (ref >59)
GFR calc non Af Amer: 68 mL/min/{1.73_m2} (ref >59)
Glucose: 83 mg/dL (ref 65–99)
Potassium: 5 mmol/L (ref 3.5–5.2)
Sodium: 131 mmol/L — ABNORMAL LOW (ref 134–144)

## 2019-08-24 ENCOUNTER — Other Ambulatory Visit (HOSPITAL_COMMUNITY)
Admission: RE | Admit: 2019-08-24 | Discharge: 2019-08-24 | Disposition: A | Discharge status: Home / Self Care | Payer: Federal, State, Local not specified - PPO | Source: Ambulatory Visit | Attending: Cardiology | Admitting: Cardiology

## 2019-08-24 DIAGNOSIS — Z20822 Contact with and (suspected) exposure to covid-19: Secondary | ICD-10-CM | POA: Insufficient documentation

## 2019-08-24 DIAGNOSIS — Z01812 Encounter for preprocedural laboratory examination: Secondary | ICD-10-CM | POA: Diagnosis not present

## 2019-08-24 LAB — SARS CORONAVIRUS 2 (TAT 6-24 HRS): SARS Coronavirus 2: NEGATIVE

## 2019-08-26 ENCOUNTER — Ambulatory Visit: Payer: Federal, State, Local not specified - PPO | Admitting: Cardiovascular Disease

## 2019-08-28 ENCOUNTER — Ambulatory Visit (HOSPITAL_COMMUNITY)
Admission: RE | Disposition: A | Discharge status: Home / Self Care | Payer: Self-pay | Source: Home / Self Care | Attending: Cardiology

## 2019-08-28 ENCOUNTER — Other Ambulatory Visit: Payer: Self-pay

## 2019-08-28 ENCOUNTER — Ambulatory Visit (HOSPITAL_COMMUNITY)
Admission: RE | Admit: 2019-08-28 | Discharge: 2019-08-28 | Disposition: A | Discharge status: Home / Self Care | Payer: Federal, State, Local not specified - PPO | Attending: Cardiology | Admitting: Cardiology

## 2019-08-28 ENCOUNTER — Ambulatory Visit (HOSPITAL_COMMUNITY): Payer: Federal, State, Local not specified - PPO

## 2019-08-28 DIAGNOSIS — I110 Hypertensive heart disease with heart failure: Secondary | ICD-10-CM | POA: Diagnosis not present

## 2019-08-28 DIAGNOSIS — Z006 Encounter for examination for normal comparison and control in clinical research program: Secondary | ICD-10-CM | POA: Insufficient documentation

## 2019-08-28 DIAGNOSIS — I5022 Chronic systolic (congestive) heart failure: Secondary | ICD-10-CM | POA: Diagnosis not present

## 2019-08-28 DIAGNOSIS — Z95818 Presence of other cardiac implants and grafts: Secondary | ICD-10-CM

## 2019-08-28 DIAGNOSIS — Z955 Presence of coronary angioplasty implant and graft: Secondary | ICD-10-CM | POA: Insufficient documentation

## 2019-08-28 DIAGNOSIS — E039 Hypothyroidism, unspecified: Secondary | ICD-10-CM | POA: Diagnosis not present

## 2019-08-28 DIAGNOSIS — I2510 Atherosclerotic heart disease of native coronary artery without angina pectoris: Secondary | ICD-10-CM | POA: Insufficient documentation

## 2019-08-28 DIAGNOSIS — I252 Old myocardial infarction: Secondary | ICD-10-CM | POA: Diagnosis not present

## 2019-08-28 DIAGNOSIS — Z87891 Personal history of nicotine dependence: Secondary | ICD-10-CM | POA: Diagnosis not present

## 2019-08-28 DIAGNOSIS — I700 Atherosclerosis of aorta: Secondary | ICD-10-CM | POA: Insufficient documentation

## 2019-08-28 DIAGNOSIS — E785 Hyperlipidemia, unspecified: Secondary | ICD-10-CM | POA: Insufficient documentation

## 2019-08-28 DIAGNOSIS — Z888 Allergy status to other drugs, medicaments and biological substances status: Secondary | ICD-10-CM | POA: Insufficient documentation

## 2019-08-28 DIAGNOSIS — I255 Ischemic cardiomyopathy: Secondary | ICD-10-CM | POA: Diagnosis not present

## 2019-08-28 DIAGNOSIS — Z8249 Family history of ischemic heart disease and other diseases of the circulatory system: Secondary | ICD-10-CM | POA: Diagnosis not present

## 2019-08-28 HISTORY — PX: ICD IMPLANT: EP1208

## 2019-08-28 SURGERY — ICD IMPLANT

## 2019-08-28 MED ORDER — ONDANSETRON HCL 4 MG/2ML IJ SOLN: 4.0000 mg | Freq: Four times a day (QID) | INTRAMUSCULAR | Status: DC | PRN

## 2019-08-28 MED ORDER — LIDOCAINE HCL (PF) 1 % IJ SOLN
INTRAMUSCULAR | Status: DC | PRN
  Administered 2019-08-28: 60 mL

## 2019-08-28 MED ORDER — CEFAZOLIN SODIUM-DEXTROSE 1-4 GM/50ML-% IV SOLN: 1.0000 g | Freq: Four times a day (QID) | INTRAVENOUS | Status: DC

## 2019-08-28 MED ORDER — FENTANYL CITRATE (PF) 100 MCG/2ML IJ SOLN
INTRAMUSCULAR | Status: AC
  Filled 2019-08-28: qty 2

## 2019-08-28 MED ORDER — LIDOCAINE HCL 1 % IJ SOLN
INTRAMUSCULAR | Status: AC
  Filled 2019-08-28: qty 60

## 2019-08-28 MED ORDER — FENTANYL CITRATE (PF) 100 MCG/2ML IJ SOLN
INTRAMUSCULAR | Status: DC | PRN
  Administered 2019-08-28: 25 ug via INTRAVENOUS

## 2019-08-28 MED ORDER — MIDAZOLAM HCL 5 MG/5ML IJ SOLN
INTRAMUSCULAR | Status: AC
  Filled 2019-08-28: qty 5

## 2019-08-28 MED ORDER — NITROGLYCERIN 0.4 MG SL SUBL
SUBLINGUAL_TABLET | SUBLINGUAL | Status: AC
  Filled 2019-08-28: qty 1

## 2019-08-28 MED ORDER — CEFAZOLIN SODIUM-DEXTROSE 1-4 GM/50ML-% IV SOLN
INTRAVENOUS | Status: AC
  Administered 2019-08-28: 1 g via INTRAVENOUS
  Filled 2019-08-28: qty 50

## 2019-08-28 MED ORDER — SODIUM CHLORIDE 0.9 % IV SOLN
80.0000 mg | INTRAVENOUS | Status: AC
  Administered 2019-08-28: 09:00:00 80 mg
  Filled 2019-08-28: qty 2

## 2019-08-28 MED ORDER — ACETAMINOPHEN 325 MG PO TABS: 325.0000 mg | ORAL_TABLET | ORAL | Status: DC | PRN

## 2019-08-28 MED ORDER — IOHEXOL 350 MG/ML SOLN
INTRAVENOUS | Status: DC | PRN
  Administered 2019-08-28: 15 mL via INTRAVENOUS

## 2019-08-28 MED ORDER — CHLORHEXIDINE GLUCONATE 4 % EX LIQD: 60.0000 mL | Freq: Once | CUTANEOUS | Status: DC

## 2019-08-28 MED ORDER — SODIUM CHLORIDE 0.9 % IV SOLN
INTRAVENOUS | Status: AC
  Filled 2019-08-28: qty 2

## 2019-08-28 MED ORDER — SODIUM CHLORIDE 0.9 % IV SOLN: INTRAVENOUS | Status: DC

## 2019-08-28 MED ORDER — HEPARIN (PORCINE) IN NACL 1000-0.9 UT/500ML-% IV SOLN
INTRAVENOUS | Status: DC | PRN
  Administered 2019-08-28: 500 mL

## 2019-08-28 MED ORDER — CHLORHEXIDINE GLUCONATE 4 % EX LIQD
60.0000 mL | Freq: Once | CUTANEOUS | Status: DC
  Filled 2019-08-28: qty 60

## 2019-08-28 MED ORDER — MIDAZOLAM HCL 5 MG/5ML IJ SOLN
INTRAMUSCULAR | Status: DC | PRN
  Administered 2019-08-28: 1 mg via INTRAVENOUS

## 2019-08-28 MED ORDER — NITROGLYCERIN 0.4 MG SL SUBL
SUBLINGUAL_TABLET | SUBLINGUAL | Status: DC | PRN
  Administered 2019-08-28: .4 mg via SUBLINGUAL

## 2019-08-28 MED ORDER — CEFAZOLIN SODIUM-DEXTROSE 2-4 GM/100ML-% IV SOLN
2.0000 g | INTRAVENOUS | Status: AC
  Administered 2019-08-28: 09:00:00 2 g via INTRAVENOUS

## 2019-08-28 MED ORDER — HEPARIN (PORCINE) IN NACL 1000-0.9 UT/500ML-% IV SOLN
INTRAVENOUS | Status: AC
  Filled 2019-08-28: qty 500

## 2019-08-28 MED ORDER — ACETAMINOPHEN 325 MG PO TABS
ORAL_TABLET | ORAL | Status: AC
  Administered 2019-08-28: 650 mg via ORAL
  Filled 2019-08-28: qty 2

## 2019-08-28 MED ORDER — CEFAZOLIN SODIUM-DEXTROSE 2-4 GM/100ML-% IV SOLN
INTRAVENOUS | Status: AC
  Filled 2019-08-28: qty 100

## 2019-08-28 SURGICAL SUPPLY — 7 items
CABLE SURGICAL S-101-97-12 (CABLE) ×2 IMPLANT
ICD VISIA MRI VR DVFB1D4 (ICD Generator) ×1 IMPLANT
LEAD SPRINT QUAT SEC 6935M-62 (Lead) ×2 IMPLANT
PAD PRO RADIOLUCENT 2001M-C (PAD) ×2 IMPLANT
SHEATH 9FR PRELUDE SNAP 13 (SHEATH) ×2 IMPLANT
TRAY PACEMAKER INSERTION (PACKS) ×2 IMPLANT
VISIA MRI VR DVFB1D4 (ICD Generator) ×2 IMPLANT

## 2019-08-28 NOTE — H&P (Signed)
Electrophysiology Office Note   Date:  08/28/2019   ID:  Pam [name], DOB [birth year], MRN [id number]  PCP:  Drosinis, [name], PA-C  Cardiologist:  Angelena Form Primary Electrophysiologist:  Pam Obara Meredith Leeds, MD    Chief Complaint: CHF   History of Present Illness: Pam [name] is a 69 y.o. female who is being seen today for the evaluation of CHF at the request of No ref. provider found. Presenting today for electrophysiology evaluation.  She has a history of coronary artery disease, ischemic cardiomyopathy, chronic systolic heart failure, hypertension, hyperlipidemia.  She had a non-STEMI December 2017, found to have a chronic total occlusion of the mid LAD.  She had severe calcific stenosis of the circumflex.  Cardiac MRI showed nonviability of the LAD territory.  Ejection fraction was 38% at the time.  She had stenting January 2018 of her circumflex.  Today, denies symptoms of palpitations, chest pain, shortness of breath, orthopnea, PND, lower extremity edema, claudication, dizziness, presyncope, syncope, bleeding, or neurologic sequela. The patient is tolerating medications without difficulties. Plan for ICD today    Past Medical History:  Diagnosis Date  . Abnormal EKG   . Alcoholic intoxication without complication ([place])   . Allergy   . Cardiomyopathy, ischemic   . Cataract   . Chest pain 08/29/2016  . CHF (congestive heart failure) ([place])   . Chronic systolic heart failure ([place]) 08/29/2016  . Coronary artery disease of native artery of native heart with stable angina pectoris ([place])   . Facial laceration   . Fall 07/07/2016  . Hyperlipidemia   . Hypertension 03/03/2017  . Hyponatremia   . Hypothyroidism 07/08/2016  . NSTEMI (non-ST elevated myocardial infarction) ([place])   . Syncope   . Thyroid disease   . Unstable angina ([place]) 08/29/2016   Past Surgical History:  Procedure Laterality Date  . CARDIAC CATHETERIZATION N/A 07/08/2016   Procedure: Left Heart Cath and Coronary  Angiography;  Surgeon: [name], MD;  Location: [facility];  Service: Cardiovascular;  Laterality: N/A;  . CARDIAC CATHETERIZATION N/A 08/29/2016   Procedure: Coronary Atherectomy;  Surgeon: [name], MD;  Location: [facility];  Service: Cardiovascular;  Laterality: N/A;  . CARDIAC CATHETERIZATION N/A 08/29/2016   Procedure: Coronary Stent Intervention;  Surgeon: [name], MD;  Location: [facility];  Service: Cardiovascular;  Laterality: N/A;  . CATARACT EXTRACTION, BILATERAL    . VAGINAL HYSTERECTOMY       Current Facility-Administered Medications  Medication Dose Route Frequency Provider Last Rate Last Admin  . 0.9 %  sodium chloride infusion   Intravenous Continuous Pam Haw, MD 50 mL/hr at 08/28/19 0714 New Bag at 08/28/19 [id number]  . ceFAZolin (ANCEF) IVPB 2g/100 mL premix  2 g Intravenous On Call Esther Broyles Hassell Done, MD      . chlorhexidine (HIBICLENS) 4 % liquid 4 application  60 mL Topical Once Tyronza Happe Hassell Done, MD      . chlorhexidine (HIBICLENS) 4 % liquid 4 application  60 mL Topical Once Khloee Garza Hassell Done, MD      . gentamicin (GARAMYCIN) 80 mg in sodium chloride 0.9 % 500 mL irrigation  80 mg Irrigation On Call Kindle Strohmeier, Ocie Doyne, MD        Allergies:   Lisinopril and Spironolactone   Social History:  The patient  reports that she quit smoking about 3 years ago. Her smoking use included cigarettes. She has a 50.00 pack-year smoking history. She has never used smokeless tobacco. She  reports current alcohol use. She reports that she does not use drugs.   Family History:  The patient's family history includes Heart disease in her mother and sister; Kidney failure in her mother.    ROS:  Please see the history of present illness.   Otherwise, review of systems is positive for none.   All other systems are reviewed and negative.   PHYSICAL EXAM: VS:  BP (!) 115/54   Pulse 64   Temp 97.7 F (36.5 C) (Oral)   Resp 15    Ht 5\' 3"  (1.6 m)   Wt 55.8 kg   SpO2 99%   BMI 21.79 kg/m  , BMI Body mass index is 21.79 kg/m. GEN: Well nourished, well developed, in no acute distress  HEENT: normal  Neck: no JVD, carotid bruits, or masses Cardiac: RRR; no murmurs, rubs, or gallops,no edema  Respiratory:  clear to auscultation bilaterally, normal work of breathing GI: soft, nontender, nondistended, + BS MS: no deformity or atrophy  Skin: warm and dry Neuro:  Strength and sensation are intact Psych: euthymic mood, full affect  EKG:  EKG is not ordered today. Personal review of the ekg ordered shows sinus rhythm   Recent Labs: 08/19/2019: BUN 10; Creatinine, Ser 0.88; Hemoglobin 13.6; Platelets 333; Potassium 5.0; Sodium 131    Lipid Panel     Component Value Date/Time   CHOL 153 03/01/2018 0801   TRIG 41 03/01/2018 0801   HDL 93 03/01/2018 0801   CHOLHDL 1.6 03/01/2018 0801   CHOLHDL 2.0 07/07/2016 1914   VLDL 17 07/07/2016 1914   LDLCALC 52 03/01/2018 0801     Wt Readings from Last 3 Encounters:  08/28/19 55.8 kg  07/01/19 56.1 kg  06/25/19 55.9 kg      Other studies Reviewed: Additional studies/ records that were reviewed today include: TTE 06/19/19  Review of the above records today demonstrates:   1. Left ventricular ejection fraction, by visual estimation, is 25 to 30%. The left ventricle has severely decreased function. Left ventricular septal wall thickness was normal. There is no left ventricular hypertrophy.  2. Severely dilated left ventricular internal cavity size.  3. Mid and apical segments severely hypokinetic Preserved basal funciton.  4. Global right ventricle has normal systolic function.The right ventricular size is normal. No increase in right ventricular wall thickness.  5. Left atrial size was mildly dilated.  6. Right atrial size was normal.  7. The mitral valve is normal in structure. Mild mitral valve regurgitation. No evidence of mitral stenosis.  8. The tricuspid  valve is normal in structure. Tricuspid valve regurgitation is not demonstrated.  9. The aortic valve is normal in structure. Aortic valve regurgitation is not visualized. No evidence of aortic valve sclerosis or stenosis. 10. The pulmonic valve was normal in structure. Pulmonic valve regurgitation is not visualized. 11. Mildly elevated pulmonary artery systolic pressure. 12. The inferior vena cava is normal in size with greater than 50% respiratory variability, suggesting right atrial pressure of 3 mmHg.   ASSESSMENT AND PLAN:  1.  Chronic systolic heart failure due to ischemic cardiomyopathy: On optimal medical therapy. Nitesh Pitstick plan for ICD implant today.  ICD Criteria  Current LVEF:25-30%. Within 12 months prior to implant: Yes   Heart failure history: Yes, Class II  Cardiomyopathy history: Yes, Ischemic Cardiomyopathy - Prior MI.  Atrial Fibrillation/Atrial Flutter: No.  Ventricular tachycardia history: No.  Cardiac arrest history: No.  History of syndromes with risk of sudden death: No.  Previous ICD: No.  Current ICD indication: Primary  PPM indication: No.  Class I or II Bradycardia indication present: No  Beta Blocker therapy for 3 or more months: Yes, prescribed.   Ace Inhibitor/ARB therapy for 3 or more months: Yes, prescribed.    I have seen Kymberlee Machovec is a 69 y.o. femalepre-procedural and has been referred by Darlina Guys for consideration of ICD implant for primary prevention of sudden death.  The patient's chart has been reviewed and they meet criteria for ICD implant.  I have had a thorough discussion with the patient reviewing options.  The patient and their family (if available) have had opportunities to ask questions and have them answered. The patient and I have decided together through the [place] [employer] to implant ICD at this time.  Risks, benefits, alternatives to ICD implantation were discussed in detail with the patient  today. The patient  understands that the risks include but are not limited to bleeding, infection, pneumothorax, perforation, tamponade, vascular damage, renal failure, MI, stroke, death, inappropriate shocks, and lead dislodgement and  wishes to proceed.

## 2019-08-28 NOTE — Discharge Instructions (Signed)
After Your ICD (Implantable Cardiac Defibrillator)   . You have a Medtronic ICD  . Do not lift your arm above shoulder height for 1 week after your procedure. After 7 days, you may progress as below.     Wednesday September 04, 2019  Thursday September 05, 2019 Friday September 06, 2019 Saturday September 07, 2019   . Do not lift, push, pull, or carry anything over 10 pounds with the affected arm until 6 weeks (Wednesday October 09, 2019 ) after your procedure.   . Do not drive until you have been seen for your wound check, or as long as instructed by your healthcare provider.   . Monitor your defibrillator site for redness, swelling, and drainage. Call the device clinic at [phone] if you experience these symptoms or fever/chills.  . If your incision is sealed with Steri-strips or staples, you may shower 7 days after your procedure. Do not remove the steri-strips or let the shower hit directly on your site. You may wash around your site with soap and water. Avoid lotions, ointments, or perfumes over your incision until it is well-healed.  . You may use a hot tub or a pool AFTER your wound check appointment if the incision is completely closed.  . Your ICD  may be MRI compatible. This will be discussed at your next office visit/wound check.   . Your ICD is designed to protect you from life threatening heart rhythms. Because of this, you may receive a shock.   o 1 shock with no symptoms:  Call the office during business hours. o 1 shock with symptoms (chest pain, chest pressure, dizziness, lightheadedness, shortness of breath, overall feeling unwell):  Call 911. o If you experience 2 or more shocks in 24 hours:  Call 911. o If you receive a shock, you should not drive for 6 months per the [place] DMV IF you receive appropriate therapy from your ICD.   . ICD Alerts:  Some alerts are vibratory and others beep. These are NOT emergencies. Please call our office to let us know. If this occurs at night  or on weekends, it can wait until the next business day. Send a remote transmission.  . If your device is capable of reading fluid status (for heart failure), you will be offered monthly monitoring to review this with you.   . Remote monitoring is used to monitor your ICD from home. This monitoring is scheduled every 91 days by our office. It allows [place] to keep an eye on the functioning of your device to ensure it is working properly. You will routinely see your Electrophysiologist annually (more often if necessary).    Cardioverter Defibrillator Implantation, Care After This sheet gives you information about how to care for yourself after your procedure. Your health care provider may also give you more specific instructions. If you have problems or questions, contact your health care provider. What can I expect after the procedure? After the procedure, it is common to have:  Some pain. It may last a few days.  A slight bump over the skin where the device was placed. Sometimes, it is possible to feel the device under the skin. This is normal.  During the months and years after your procedure, your health care provider will check the device, the leads, and the battery every few months. Eventually, when the battery is low, the device will be replaced.  You should receive your defibrillator ID card for your new device in the next 4-8  weeks.  Follow these instructions at home: Medicines  Take over-the-counter and prescription medicines only as told by your health care provider.  If you were prescribed an antibiotic medicine, take it as told by your health care provider. Do not stop taking the antibiotic even if you start to feel better. Incision care        Follow instructions from your health care provider about how to take care of your incision area. Make sure you: ? Leave adhesive strips in place. These skin closures may need to stay in place for 2 weeks or longer. If adhesive strip  edges start to loosen and curl up, you may trim the loose edges. Do not remove adhesive strips completely unless your health care provider tells you to do that.  Check your incision area every day for signs of infection. Check for: ? More redness, swelling, or pain. ? More fluid or blood. ? Warmth. ? Pus or a bad smell.  Do not use lotions or ointments near the incision area unless told by your health care provider.  Keep the incision area clean and dry for 7 days after the procedure or for as long as told by your health care provider. It takes several weeks for the incision site to heal completely.  Do not take baths, swim, or use a hot tub until your health care provider approves. Activity  Try to walk a little every day. Exercising is important after this procedure. Also, use your shoulder on the side of the defibrillator in daily tasks that do not require a lot of motion.  For at least 1 week: ? Do not lift your upper arm above your shoulders. This means no tennis, golf, or swimming for this period of time. If you tend to sleep with your arm above your head, use a restraint to prevent this during sleep.  For at least 6 weeks: ? Avoid sudden jerking, pulling, or chopping movements that pull your upper arm far away from your body.  Ask your health care provider when you may go back to work.  Check with your health care provider before you start to drive or play sports. Electric and magnetic fields  Tell all health care providers that you have a defibrillator. This may prevent them from giving you an MRI scan because strong magnets are used for that test.  If you must pass through a metal detector, quickly walk through it. Do not stop under the detector, and do not stand near it.  Avoid places or objects that have a strong electric or magnetic field, including: ? Airport [employer]. At the airport, let officials know that you have a defibrillator. Your defibrillator ID card will  let you be checked in a way that is safe for you and will not damage your defibrillator. Also, do not let a security person wave a magnetic wand near your defibrillator. That can make it stop working. ? Power plants. ? Large electrical generators. ? Anti-theft systems or electronic article surveillance (EAS). ? Radiofrequency transmission towers, such as cell phone and radio towers.  Do not use amateur (ham) radio equipment or electric (arc) welding torches. Some devices are safe to use if held at least 12 inches (30 cm) from your defibrillator. These include power tools, lawn mowers, and speakers. If you are unsure if something is safe to use, ask your health care provider.  Do not use MP3 player headphones. They have magnets.  You may safely use electric blankets, heating pads,  computers, and microwave ovens.  When using your cell phone, hold it to the ear that is on the opposite side from the defibrillator. Do not leave your cell phone in a pocket over the defibrillator. General instructions  Follow diet instructions from your health care provider, if this applies.  Always keep your defibrillator ID card with you. The card should list the implant date, device model, and manufacturer. Consider wearing a medical alert bracelet or necklace.  Have your defibrillator checked every 3-6 months or as often as told by your health care provider. Most defibrillators last for 4-8 years.  Keep all follow-up visits as told by your health care provider. This is important for your health care provider to make sure your chest is healing the way it should. Ask your health care provider when you should come back to have your stitches or staples taken out. Contact a health care provider if:  You gain weight suddenly.  Your legs or feet swell more than they have before.  It feels like your heart is fluttering or skipping beats (heart palpitations).  You have more redness, swelling, or pain around your  incision.  You have more fluid or blood coming from your incision.  Your incision feels warm to the touch.  You have pus or a bad smell coming from your incision.  You have a fever. Get help right away if:  You have chest pain.  You feel more than one shock.  You feel more short of breath than you have felt before.  You feel more light-headed than you have felt before.  Your incision starts to open up. This information is not intended to replace advice given to you by your health care provider. Make sure you discuss any questions you have with your health care provider.

## 2019-08-29 MED FILL — Lidocaine HCl Local Inj 1%: INTRAMUSCULAR | Qty: 60 | Status: AC

## 2019-09-03 ENCOUNTER — Other Ambulatory Visit: Payer: Self-pay | Admitting: Physician Assistant

## 2019-09-10 ENCOUNTER — Other Ambulatory Visit: Payer: Self-pay

## 2019-09-10 ENCOUNTER — Ambulatory Visit (INDEPENDENT_AMBULATORY_CARE_PROVIDER_SITE_OTHER): Payer: Federal, State, Local not specified - PPO | Admitting: *Deleted

## 2019-09-10 DIAGNOSIS — I255 Ischemic cardiomyopathy: Secondary | ICD-10-CM | POA: Diagnosis not present

## 2019-09-10 DIAGNOSIS — I5022 Chronic systolic (congestive) heart failure: Secondary | ICD-10-CM | POA: Diagnosis not present

## 2019-09-10 DIAGNOSIS — Z95810 Presence of automatic (implantable) cardiac defibrillator: Secondary | ICD-10-CM

## 2019-09-10 LAB — CUP PACEART INCLINIC DEVICE CHECK
Battery Remaining Longevity: 137 mo
Battery Voltage: 3.16 V
Brady Statistic RV Percent Paced: 0.02 %
Date Time Interrogation Session: 20210209093000
HighPow Impedance: 58 Ohm
Implantable Lead Implant Date: 20210127
Implantable Lead Location: 753860
Implantable Pulse Generator Implant Date: 20210127
Lead Channel Impedance Value: 380 Ohm
Lead Channel Impedance Value: 456 Ohm
Lead Channel Pacing Threshold Amplitude: 0.875 V
Lead Channel Pacing Threshold Pulse Width: 0.4 ms
Lead Channel Sensing Intrinsic Amplitude: 6.1 mV
Lead Channel Setting Pacing Amplitude: 3.5 V
Lead Channel Setting Pacing Pulse Width: 0.4 ms
Lead Channel Setting Sensing Sensitivity: 0.3 mV

## 2019-09-10 NOTE — Patient Instructions (Signed)
Call if swelling or pain  increases, or you have drainage, or  Redness at incision site. Wear pocket pal device except for when you shower until follow up appointment on 09/17/19.

## 2019-09-10 NOTE — Progress Notes (Signed)
Wound check appointment. Steri-strips removed. Wound without redness. Hematoma noted to at device wound site. Incision edges approximated. Dr. Curt Bears assessed patient and wants to return to office on 09/17/19. Vo order obtained from Dr. Curt Bears for [place] applied. . Normal device function. Thresholds, sensing, and impedances consistent with implant measurements. Device programmed at 3.5V for extra safety margin until 11/28/19. Histogram distribution appropriate for patient and level of activity. No ventricular arrhythmias noted. Patient educated about wound care, arm mobility, lifting restrictions, pocket pal, shock plan. Patient advised not to drive until assessed next week. Pt verbalizes understanding. Follow up with Dr. Curt Bears 09/17/19. Next home remote 11/28/19.

## 2019-09-17 ENCOUNTER — Other Ambulatory Visit: Payer: Self-pay

## 2019-09-17 ENCOUNTER — Ambulatory Visit (INDEPENDENT_AMBULATORY_CARE_PROVIDER_SITE_OTHER): Payer: Federal, State, Local not specified - PPO | Admitting: *Deleted

## 2019-09-17 DIAGNOSIS — I255 Ischemic cardiomyopathy: Secondary | ICD-10-CM

## 2019-09-17 NOTE — Progress Notes (Signed)
Recheck of wound site due to hematoma. Pocket Pal removed. Wound site without redness or drainage. Bruising at and around wound site. Decreased edema from last visit. Dr Curt Bears in to see patient and gave instructions to discontinue use of Pocket Pal.  Patient to follow up 11/28/19 as scheduled.Instrutions given to call office for any s/sx of infection or increased edema.

## 2019-09-17 NOTE — Patient Instructions (Signed)
Call the office if you have any increased swelling, increased pain, redness or drainage. [phone]

## 2019-10-02 ENCOUNTER — Other Ambulatory Visit (INDEPENDENT_AMBULATORY_CARE_PROVIDER_SITE_OTHER): Payer: Federal, State, Local not specified - PPO

## 2019-10-02 ENCOUNTER — Other Ambulatory Visit: Payer: Self-pay

## 2019-10-02 DIAGNOSIS — E213 Hyperparathyroidism, unspecified: Secondary | ICD-10-CM

## 2019-10-02 LAB — BASIC METABOLIC PANEL
BUN: 13 mg/dL (ref 6–23)
CO2: 28 mEq/L (ref 19–32)
Calcium: 11 mg/dL — ABNORMAL HIGH (ref 8.4–10.5)
Chloride: 95 mEq/L — ABNORMAL LOW (ref 96–112)
Creatinine, Ser: 0.9 mg/dL (ref 0.40–1.20)
GFR: 62.23 mL/min (ref >60.00)
Glucose, Bld: 90 mg/dL (ref 70–99)
Potassium: 4.8 mEq/L (ref 3.5–5.1)
Sodium: 127 mEq/L — ABNORMAL LOW (ref 135–145)

## 2019-10-02 LAB — VITAMIN D 25 HYDROXY (VIT D DEFICIENCY, FRACTURES): VITD: 31.81 ng/mL (ref 30.00–100.00)

## 2019-10-07 ENCOUNTER — Other Ambulatory Visit: Payer: Self-pay

## 2019-10-08 ENCOUNTER — Ambulatory Visit: Payer: Federal, State, Local not specified - PPO | Admitting: Endocrinology

## 2019-10-08 ENCOUNTER — Encounter: Payer: Self-pay | Admitting: Endocrinology

## 2019-10-08 DIAGNOSIS — M810 Age-related osteoporosis without current pathological fracture: Secondary | ICD-10-CM | POA: Diagnosis not present

## 2019-10-08 DIAGNOSIS — E871 Hypo-osmolality and hyponatremia: Secondary | ICD-10-CM

## 2019-10-08 NOTE — Progress Notes (Signed)
Patient ID: Pam [name], female   DOB: 17-Nov-1950, 69 y.o.   MRN: [id number]                  Chief complaint: Follow-up  History of Present Illness:   Referring PCP: Drosinis, Wynona Dove, PA-C  HYPERCALCEMIA:   Review of records show that she has had a high calcium since 05/10/2018 when her calcium was 10.6. Previously highest calcium was 10.2 done in 2018 She had been referred here because of the finding of a PTH of 107 done in 05/2019  Most recent calcium was 10.8 done on 05/10/19 prior to her initial consultation  Lab Results  Component Value Date   CALCIUM 11.0 (H) 10/02/2019   CALCIUM 10.8 (H) 08/19/2019   CALCIUM 9.6 09/29/2017   CALCIUM 9.5 08/21/2017   CALCIUM 9.9 03/21/2017   CALCIUM 9.9 03/03/2017   CALCIUM 10.2 02/17/2017   CALCIUM 9.1 08/30/2016   CALCIUM 10.0 08/25/2016    The hypercalcemia is not associated with any low trauma fractures, renal insufficiency, nephrolithiasis, sarcoidosis, known carcinoma  OSTEOPOROSIS:  She has not had any height loss  Screening bone density showed osteoporosis at the hip and radius as of 07/2019  Since then she has been started on alendronate 70 mg weekly She does not have any difficulties taking this every week on empty stomach She does not take any vitamin D supplements   Lumbar spine L1-L3 (L4) Femoral neck (FN) 33% distal radius Ultra distal radius  T-score -1.0 RFN: -2.8 LFN: -2.4  -1.6  -2.7     25 (OH) Vitamin D level as follows:  Lab Results  Component Value Date   VD25OH 31.81 10/02/2019   HYPONATREMIA: See review of systems   Allergies as of 10/08/2019      Reactions   Lisinopril    Dry cough   Spironolactone Other (See Comments)   Low sodium      Medication List       Accurate as of October 08, 2019  8:58 AM. If you have any questions, ask your nurse or doctor.        acetaminophen 500 MG tablet Commonly known as: TYLENOL Take 1,000 mg by mouth every 6 (six) hours as needed (for pain.).    alendronate 70 MG tablet Commonly known as: FOSAMAX Take 1 tablet (70 mg total) by mouth once a week. Take with a full glass of water on an empty stomach. What changed: when to take this   atorvastatin 80 MG tablet Commonly known as: LIPITOR TAKE 1 TABLET(80 MG) BY MOUTH DAILY What changed: See the new instructions.   carvedilol 6.25 MG tablet Commonly known as: COREG TAKE 1 TABLET(6.25 MG) BY MOUTH TWICE DAILY What changed:   how much to take  how to take this  when to take this  additional instructions   clopidogrel 75 MG tablet Commonly known as: PLAVIX TAKE 1 TABLET(75 MG) BY MOUTH DAILY WITH BREAKFAST What changed:   how much to take  how to take this  when to take this   furosemide 20 MG tablet Commonly known as: LASIX TAKE 1 TABLET BY MOUTH EVERY MONDAY, WEDNESDAY AND FRIDAY   levothyroxine 25 MCG tablet Commonly known as: SYNTHROID Take 37.5 mcg by mouth daily before breakfast. Take 1.5 tablets(37.5mg  total) by mouth once daily.   losartan 100 MG tablet Commonly known as: COZAAR Take 1 tablet (100 mg total) by mouth daily.   nitroGLYCERIN 0.4 MG SL tablet Commonly known as: NITROSTAT Place  1 tablet (0.4 mg total) under the tongue every 5 (five) minutes x 3 doses as needed for chest pain.   ranolazine 1000 MG SR tablet Commonly known as: Ranexa Take 1 tablet (1,000 mg total) by mouth 2 (two) times daily.       Allergies:  Allergies  Allergen Reactions  . Lisinopril     Dry cough  . Spironolactone Other (See Comments)    Low sodium    Past Medical History:  Diagnosis Date  . Abnormal EKG   . Alcoholic intoxication without complication ([place])   . Allergy   . Cardiomyopathy, ischemic   . Cataract   . Chest pain 08/29/2016  . CHF (congestive heart failure) ([place])   . Chronic systolic heart failure ([place]) 08/29/2016  . Coronary artery disease of native artery of native heart with stable angina pectoris ([place])   . Facial laceration   . Fall  07/07/2016  . Hyperlipidemia   . Hypertension 03/03/2017  . Hyponatremia   . Hypothyroidism 07/08/2016  . NSTEMI (non-ST elevated myocardial infarction) ([place])   . Syncope   . Thyroid disease   . Unstable angina ([place]) 08/29/2016    Past Surgical History:  Procedure Laterality Date  . CARDIAC CATHETERIZATION N/A 07/08/2016   Procedure: Left Heart Cath and Coronary Angiography;  Surgeon: [name], MD;  Location: [facility];  Service: Cardiovascular;  Laterality: N/A;  . CARDIAC CATHETERIZATION N/A 08/29/2016   Procedure: Coronary Atherectomy;  Surgeon: [name], MD;  Location: [facility];  Service: Cardiovascular;  Laterality: N/A;  . CARDIAC CATHETERIZATION N/A 08/29/2016   Procedure: Coronary Stent Intervention;  Surgeon: [name], MD;  Location: [facility];  Service: Cardiovascular;  Laterality: N/A;  . CATARACT EXTRACTION, BILATERAL    . ICD IMPLANT N/A 08/28/2019   Procedure: ICD IMPLANT;  Surgeon: Constance Haw, MD;  Location: [facility];  Service: Cardiovascular;  Laterality: N/A;  . VAGINAL HYSTERECTOMY      Family History  Problem Relation Age of Onset  . Kidney failure Mother   . Heart disease Mother        CHF  . Heart disease Sister   . Colon cancer Neg Hx   . Esophageal cancer Neg Hx   . Rectal cancer Neg Hx   . Stomach cancer Neg Hx     Social History:  reports that she quit smoking about 3 years ago. Her smoking use included cigarettes. She has a 50.00 pack-year smoking history. She has never used smokeless tobacco. She reports current alcohol use. She reports that she does not use drugs.  Review of Systems  She has mild hypothyroidism, apparently asymptomatic She has been prescribed levothyroxine 37.5 mcg daily by her PCP  TSH 1.6 in 10/20  HYPONATREMIA: She appears to have had long-term hyponatremia of unclear etiology, not on thiazide diuretics except Lasix Also not on any other drugs causing  hyponatremia She does try to drink a lot of water Has not had any issues with nausea or weight loss  Wt Readings from Last 3 Encounters:  10/08/19 124 lb 6.4 oz (56.4 kg)  08/28/19 123 lb (55.8 kg)  07/01/19 123 lb 9.6 oz (56.1 kg)   Lab Results  Component Value Date   NA 127 (L) 10/02/2019   K 4.8 10/02/2019   CL 95 (L) 10/02/2019   CO2 28 10/02/2019     EXAM:  BP 118/60 (BP Location: Left Arm, Patient Position: Sitting, Cuff Size: Normal)  Pulse 66   Ht 5\' 3"  (1.6 m)   Wt 124 lb 6.4 oz (56.4 kg)   SpO2 96%   BMI 22.04 kg/m    No ankle edema present  Assessment/Plan:   HYPERCALCEMIA:  She has had mild hypercalcemia secondary to primary hyperparathyroidism since at least 2019 with the highest level now 11.0 compared to 10.8  She is again asymptomatic However does have osteoporosis  Although she may benefit from parathyroid exploration she has only mild osteoporosis and this may be able to be treated adequately with bisphosphonates She will continue a trial of alendronate which she is already Also advised her to take vitamin D3 1000 units daily  If she has any symptoms, rising calcium above 11.5 or height loss as well as decrease in bone density in the future may consider parathyroid surgery  Otherwise can be followed periodically with calcium levels  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  Patient Instructions  Vitamin D3, [phone] units daily    Elayne Snare 10/08/2019, 8:58 AM   Addendum: Urine osmolality 332, needs fluid restriction under 1 L/day

## 2019-10-08 NOTE — Patient Instructions (Signed)
Vitamin D3, [phone] units daily

## 2019-10-09 DIAGNOSIS — M810 Age-related osteoporosis without current pathological fracture: Secondary | ICD-10-CM | POA: Insufficient documentation

## 2019-10-09 LAB — SODIUM, URINE, RANDOM: Sodium, Ur: 39 mmol/L

## 2019-10-09 LAB — OSMOLALITY, URINE: Osmolality, Ur: 332 mOsmol/kg

## 2019-10-11 ENCOUNTER — Telehealth: Payer: Self-pay | Admitting: Endocrinology

## 2019-10-11 NOTE — Telephone Encounter (Signed)
Patient called asking which D3 she needed to be taking. She is looking for it to purchase it and there are many D3's - she requests to be called back at [phone].

## 2019-10-11 NOTE — Telephone Encounter (Signed)
Was pt instructed to take OTC Vit D?

## 2019-10-13 NOTE — Telephone Encounter (Signed)
She was told to take 1000 units daily of vitamin D3

## 2019-10-14 NOTE — Telephone Encounter (Signed)
Called pt and gave her MD message. Pt verbalized understanding. 

## 2019-11-04 ENCOUNTER — Ambulatory Visit: Payer: Federal, State, Local not specified - PPO | Admitting: Cardiovascular Disease

## 2019-11-04 ENCOUNTER — Other Ambulatory Visit: Payer: Self-pay

## 2019-11-04 ENCOUNTER — Encounter (INDEPENDENT_AMBULATORY_CARE_PROVIDER_SITE_OTHER): Payer: Self-pay

## 2019-11-04 ENCOUNTER — Encounter: Payer: Self-pay | Admitting: Cardiovascular Disease

## 2019-11-04 VITALS — BP 130/60 | HR 68 | Ht 63.0 in | Wt 123.8 lb

## 2019-11-04 DIAGNOSIS — E785 Hyperlipidemia, unspecified: Secondary | ICD-10-CM

## 2019-11-04 DIAGNOSIS — I255 Ischemic cardiomyopathy: Secondary | ICD-10-CM | POA: Diagnosis not present

## 2019-11-04 DIAGNOSIS — I25118 Atherosclerotic heart disease of native coronary artery with other forms of angina pectoris: Secondary | ICD-10-CM

## 2019-11-04 DIAGNOSIS — I5022 Chronic systolic (congestive) heart failure: Secondary | ICD-10-CM

## 2019-11-04 NOTE — Patient Instructions (Signed)
Medication Instructions:  Your physician recommends that you continue on your current medications as directed. Please refer to the Current Medication list given to you today.  *If you need a refill on your cardiac medications before your next appointment, please call your pharmacy*   Lab Work: None If you have labs (blood work) drawn today and your tests are completely normal, you will receive your results only by: . MyChart Message (if you have MyChart) OR . A paper copy in the mail If you have any lab test that is abnormal or we need to change your treatment, we will call you to review the results.   Testing/Procedures: None   Follow-Up: At [facility], you and your health needs are our priority.  As part of our continuing mission to provide you with exceptional heart care, we have created designated Provider Care Teams.  These Care Teams include your primary Cardiologist (physician) and Advanced Practice Providers (APPs -  Physician Assistants and Nurse Practitioners) who all work together to provide you with the care you need, when you need it.  We recommend signing up for the patient portal called "MyChart".  Sign up information is provided on this After Visit Summary.  MyChart is used to connect with patients for Virtual Visits (Telemedicine).  Patients are able to view lab/test results, encounter notes, upcoming appointments, etc.  Non-urgent messages can be sent to your provider as well.   To learn more about what you can do with MyChart, go to [email or web address].    Your next appointment:   12 month(s)  The format for your next appointment:   In Person  Provider:   You may see Christopher McAlhany, MD or one of the following Advanced Practice Providers on your designated Care Team:    Dayna Dunn, PA-C  Michele Lenze, PA-C    Other Instructions   

## 2019-11-04 NOTE — Progress Notes (Signed)
Chief Complaint  Patient presents with  . Follow-up    CAD   History of Present Illness: 69 yo female with history of CAD, ischemic cardiomyopathy, chronic systolic CHF, HTN, HLD and hypothyroidism who is here today for cardiac follow up. She had been followed by Dr. Saunders Revel. I met her in November 2019. She was admitted to [facility] in December 2017 with a NSTEMI. She was found to have a chronic occlusion of the mid LAD and severe, calcified stenosis in the proximal Circumflex.LVEF was 30% by LV gram and 35-40% by echo. Cardiac MRI December 2017 with non-viability of the anterior wall in the LAD territory. LVEF=38% by cardiac MRI. The Circumflex stenosiswas treated with orbital atherectomy and stenting using a drug eluting stent in a staged procedure in January 2018. Echo November 2020 with LVEF=25-30%. Dilated LV. Mild MR. ICD placed in January 2021.   She is here today for follow up. The patient denies any chest pain, dyspnea, palpitations, lower extremity edema, orthopnea, PND, dizziness, near syncope or syncope.   Primary Care Physician: Drosinis, [name], PA-C  Past Medical History:  Diagnosis Date  . Abnormal EKG   . Alcoholic intoxication without complication ([place])   . Allergy   . Cardiomyopathy, ischemic   . Cataract   . Chest pain 08/29/2016  . CHF (congestive heart failure) ([place])   . Chronic systolic heart failure ([place]) 08/29/2016  . Coronary artery disease of native artery of native heart with stable angina pectoris ([place])   . Facial laceration   . Fall 07/07/2016  . Hyperlipidemia   . Hypertension 03/03/2017  . Hyponatremia   . Hypothyroidism 07/08/2016  . NSTEMI (non-ST elevated myocardial infarction) ([place])   . Syncope   . Thyroid disease   . Unstable angina ([place]) 08/29/2016    Past Surgical History:  Procedure Laterality Date  . CARDIAC CATHETERIZATION N/A 07/08/2016   Procedure: Left Heart Cath and Coronary Angiography;  Surgeon: [name], MD;  Location: [place];  Service: Cardiovascular;  Laterality: N/A;  . CARDIAC CATHETERIZATION N/A 08/29/2016   Procedure: Coronary Atherectomy;  Surgeon: [name], MD;  Location: [place] [facility];  Service: Cardiovascular;  Laterality: N/A;  . CARDIAC CATHETERIZATION N/A 08/29/2016   Procedure: Coronary Stent Intervention;  Surgeon: [name], MD;  Location: [place] [facility];  Service: Cardiovascular;  Laterality: N/A;  . CATARACT EXTRACTION, BILATERAL    . ICD IMPLANT N/A 08/28/2019   Procedure: ICD IMPLANT;  Surgeon: Constance Haw, MD;  Location: [facility];  Service: Cardiovascular;  Laterality: N/A;  . VAGINAL HYSTERECTOMY      Current Outpatient Medications  Medication Sig Dispense Refill  . acetaminophen (TYLENOL) 500 MG tablet Take 1,000 mg by mouth every 6 (six) hours as needed (for pain.).    Marland Kitchen alendronate (FOSAMAX) 70 MG tablet Take 1 tablet (70 mg total) by mouth once a week. Take with a full glass of water on an empty stomach. (Patient taking differently: Take 70 mg by mouth every Tuesday. Take with a full glass of water on an empty stomach. ) 12 tablet 3  . atorvastatin (LIPITOR) 80 MG tablet TAKE 1 TABLET(80 MG) BY MOUTH DAILY (Patient taking differently: Take 80 mg by mouth every evening. ) 90 tablet 1  . carvedilol (COREG) 6.25 MG tablet TAKE 1 TABLET(6.25 MG) BY MOUTH TWICE DAILY (Patient taking differently: Take 6.25 mg by mouth 2 (two) times daily. ) 180 tablet 3  . clopidogrel (PLAVIX) 75 MG  tablet TAKE 1 TABLET(75 MG) BY MOUTH DAILY WITH BREAKFAST (Patient taking differently: Take 75 mg by mouth daily. TAKE 1 TABLET(75 MG) BY MOUTH DAILY WITH BREAKFAST) 90 tablet 3  . furosemide (LASIX) 20 MG tablet TAKE 1 TABLET BY MOUTH EVERY MONDAY, WEDNESDAY AND FRIDAY 45 tablet 2  . levothyroxine (SYNTHROID, LEVOTHROID) 25 MCG tablet Take 37.5 mcg by mouth daily before breakfast. Take 1.5 tablets(37.'5mg'$  total) by mouth once daily.    Marland Kitchen losartan (COZAAR) 100 MG tablet Take 1  tablet (100 mg total) by mouth daily. 90 tablet 1  . nitroGLYCERIN (NITROSTAT) 0.4 MG SL tablet Place 1 tablet (0.4 mg total) under the tongue every 5 (five) minutes x 3 doses as needed for chest pain. 75 tablet 2  . ranolazine (RANEXA) 1000 MG SR tablet Take 1 tablet (1,000 mg total) by mouth 2 (two) times daily. 180 tablet 2   No current facility-administered medications for this visit.    Allergies  Allergen Reactions  . Lisinopril     Dry cough  . Spironolactone Other (See Comments)    Low sodium    Social History   Socioeconomic History  . Marital status: Married    Spouse name: Not on file  . Number of children: 2  . Years of education: Not on file  . Highest education level: Not on file  Occupational History  . Occupation: Retired-worked for post office  Tobacco Use  . Smoking status: Former Smoker    Packs/day: 1.00    Years: 50.00    Pack years: 50.00    Types: Cigarettes    Quit date: 07/07/2016    Years since quitting: 3.3  . Smokeless tobacco: Never Used  Substance and Sexual Activity  . Alcohol use: Yes    Comment: rarely-mixed drink once in a blue moon  . Drug use: No  . Sexual activity: Not on file  Other Topics Concern  . Not on file  Social History Narrative  . Not on file   Social Determinants of Health   Financial Resource Strain:   . Difficulty of Paying Living Expenses:   Food Insecurity:   . Worried About Charity fundraiser in the Last Year:   . Arboriculturist in the Last Year:   Transportation Needs:   . Film/video editor (Medical):   Marland Kitchen Lack of Transportation (Non-Medical):   Physical Activity:   . Days of Exercise per Week:   . Minutes of Exercise per Session:   Stress:   . Feeling of Stress :   Social Connections:   . Frequency of Communication with Friends and Family:   . Frequency of Social Gatherings with Friends and Family:   . Attends Religious Services:   . Active Member of Clubs or Organizations:   . Attends Theatre manager Meetings:   Marland Kitchen Marital Status:   Intimate Partner Violence:   . Fear of Current or Ex-Partner:   . Emotionally Abused:   Marland Kitchen Physically Abused:   . Sexually Abused:     Family History  Problem Relation Age of Onset  . Kidney failure Mother   . Heart disease Mother        CHF  . Heart disease Sister   . Colon cancer Neg Hx   . Esophageal cancer Neg Hx   . Rectal cancer Neg Hx   . Stomach cancer Neg Hx     Review of Systems:  As stated in the HPI and otherwise negative.  BP 130/60   Pulse 68   Ht '5\' 3"'$  (1.6 m)   Wt 123 lb 12.8 oz (56.2 kg)   SpO2 98%   BMI 21.93 kg/m   Physical Examination: General: Well developed, well nourished, NAD  HEENT: OP clear, mucus membranes moist  SKIN: warm, dry. No rashes. Neuro: No focal deficits  Musculoskeletal: Muscle strength 5/5 all ext  Psychiatric: Mood and affect normal  Neck: No JVD, no carotid bruits, no thyromegaly, no lymphadenopathy.  Lungs:Clear bilaterally, no wheezes, rhonci, crackles Cardiovascular: Regular rate and rhythm. No murmurs, gallops or rubs. Abdomen:Soft. Bowel sounds present. Non-tender.  Extremities: No lower extremity edema. Pulses are 2 + in the bilateral DP/PT.  EKG:  EKG is not ordered today. The ekg ordered today demonstrates   Echo November 2020: 1. Left ventricular ejection fraction, by visual estimation, is 25 to  30%. The left ventricle has severely decreased function. Left ventricular  septal wall thickness was normal. There is no left ventricular  hypertrophy.  2. Severely dilated left ventricular internal cavity size.  3. Mid and apical segments severely hypokinetic Preserved basal funciton.  4. Global right ventricle has normal systolic function.The right  ventricular size is normal. No increase in right ventricular wall  thickness.  5. Left atrial size was mildly dilated.  6. Right atrial size was normal.  7. The mitral valve is normal in structure. Mild mitral valve   regurgitation. No evidence of mitral stenosis.  8. The tricuspid valve is normal in structure. Tricuspid valve  regurgitation is not demonstrated.  9. The aortic valve is normal in structure. Aortic valve regurgitation is  not visualized. No evidence of aortic valve sclerosis or stenosis.  10. The pulmonic valve was normal in structure. Pulmonic valve  regurgitation is not visualized.  11. Mildly elevated pulmonary artery systolic pressure.  12. The inferior vena cava is normal in size with greater than 50%  respiratory variability, suggesting right atrial pressure of 3 mmHg.   Recent Labs: 08/19/2019: Hemoglobin 13.6; Platelets 333 10/02/2019: BUN 13; Creatinine, Ser 0.90; Potassium 4.8; Sodium 127   Lipid Panel    Component Value Date/Time   CHOL 153 03/01/2018 0801   TRIG 41 03/01/2018 0801   HDL 93 03/01/2018 0801   CHOLHDL 1.6 03/01/2018 0801   CHOLHDL 2.0 07/07/2016 1914   VLDL 17 07/07/2016 1914   LDLCALC 52 03/01/2018 0801     Wt Readings from Last 3 Encounters:  11/04/19 123 lb 12.8 oz (56.2 kg)  10/08/19 124 lb 6.4 oz (56.4 kg)  08/28/19 123 lb (55.8 kg)     Other studies Reviewed: Additional studies/ records that were reviewed today include: . Review of the above records demonstrates:    Assessment and Plan:   1.CAD with stable angina: She has no chest pain. Will continue Plavix, statin, Ranexa and beta blocker. .     2. Chronic systolic CHF/Ischemic cardiomyopathy: Weight is stable. No evidence of volume overload on exam. ICD in place. Continue Coreg and Losartan. Continue Lasix.  Delene Loll has not been started due to soft BP in the past.   3. Hyperlipidemia: Lipids followed in primary care. No recent lipids. She sees primary care on Friday of this week and is scheduled for labs. Continue statin.     4. Tobacco abuse, in remission: She stopped smoking in 2017.  Current medicines are reviewed at length with the patient today.  The patient does not have  concerns regarding medicines.  The following changes have been made:  no change  Labs/ tests ordered today include:   No orders of the defined types were placed in this encounter.  Disposition:   FU with me in 12 months  Signed, Lauree Chandler, MD 11/04/2019 2:00 PM    [address], [place], [place]  [address] Phone: [phone]; Fax: [phone]

## 2019-11-28 ENCOUNTER — Ambulatory Visit (INDEPENDENT_AMBULATORY_CARE_PROVIDER_SITE_OTHER): Payer: Federal, State, Local not specified - PPO | Admitting: *Deleted

## 2019-11-28 ENCOUNTER — Other Ambulatory Visit: Payer: Self-pay

## 2019-11-28 ENCOUNTER — Ambulatory Visit: Payer: Federal, State, Local not specified - PPO | Admitting: Cardiology

## 2019-11-28 ENCOUNTER — Encounter: Payer: Self-pay | Admitting: Cardiology

## 2019-11-28 VITALS — BP 114/52 | HR 67 | Ht 63.0 in | Wt 124.0 lb

## 2019-11-28 DIAGNOSIS — I255 Ischemic cardiomyopathy: Secondary | ICD-10-CM

## 2019-11-28 LAB — CUP PACEART REMOTE DEVICE CHECK
Battery Remaining Longevity: 136 mo
Battery Voltage: 3.15 V
Brady Statistic RV Percent Paced: 0.02 %
Date Time Interrogation Session: 20210429022603
HighPow Impedance: 60 Ohm
Implantable Lead Implant Date: 20210127
Implantable Lead Location: 753860
Implantable Pulse Generator Implant Date: 20210127
Lead Channel Impedance Value: 323 Ohm
Lead Channel Impedance Value: 399 Ohm
Lead Channel Pacing Threshold Amplitude: 0.75 V
Lead Channel Pacing Threshold Pulse Width: 0.4 ms
Lead Channel Sensing Intrinsic Amplitude: 6.5 mV
Lead Channel Sensing Intrinsic Amplitude: 6.5 mV
Lead Channel Setting Pacing Amplitude: 3.5 V
Lead Channel Setting Pacing Pulse Width: 0.4 ms
Lead Channel Setting Sensing Sensitivity: 0.3 mV

## 2019-11-28 NOTE — Progress Notes (Signed)
Electrophysiology Office Note   Date:  11/28/2019   ID:  Pam [name], DOB [birth year], MRN [id number]  PCP:  Drosinis, [name], PA-C  Cardiologist:  Angelena Form Primary Electrophysiologist:  Brandley Aldrete Meredith Leeds, MD    Chief Complaint: CHF   History of Present Illness: Pam [name] is a 69 y.o. female who is being seen today for the evaluation of CHF at the request of Drosinis, [name], PA-C. Presenting today for electrophysiology evaluation.  She has a history of coronary artery disease, ischemic cardiomyopathy, chronic systolic heart failure, hypertension, hyperlipidemia.  She had a non-STEMI December 2017, found to have a chronic total occlusion of the mid LAD.  She had severe calcific stenosis of the circumflex.  Cardiac MRI showed nonviability of the LAD territory.  Ejection fraction was 38% at the time.  She had stenting January 2018 of her circumflex.  She is status post a Medtronic ICD implanted 08/28/2019.  Today, denies symptoms of palpitations, chest pain, shortness of breath, orthopnea, PND, lower extremity edema, claudication, dizziness, presyncope, syncope, bleeding, or neurologic sequela. The patient is tolerating medications without difficulties.  Since her device was implanted she has done well.  She has been able to do all of her daily activities without restriction.  She is planning on a trip this summer to the mountains.  Aside from no complaints.   Past Medical History:  Diagnosis Date  . Abnormal EKG   . Alcoholic intoxication without complication ([place])   . Allergy   . Cardiomyopathy, ischemic   . Cataract   . Chest pain 08/29/2016  . CHF (congestive heart failure) ([place])   . Chronic systolic heart failure ([place]) 08/29/2016  . Coronary artery disease of native artery of native heart with stable angina pectoris ([place])   . Facial laceration   . Fall 07/07/2016  . Hyperlipidemia   . Hypertension 03/03/2017  . Hyponatremia   . Hypothyroidism 07/08/2016  . NSTEMI (non-ST  elevated myocardial infarction) ([place])   . Syncope   . Thyroid disease   . Unstable angina ([place]) 08/29/2016   Past Surgical History:  Procedure Laterality Date  . CARDIAC CATHETERIZATION N/A 07/08/2016   Procedure: Left Heart Cath and Coronary Angiography;  Surgeon: [name], MD;  Location: [facility];  Service: Cardiovascular;  Laterality: N/A;  . CARDIAC CATHETERIZATION N/A 08/29/2016   Procedure: Coronary Atherectomy;  Surgeon: [name], MD;  Location: [facility];  Service: Cardiovascular;  Laterality: N/A;  . CARDIAC CATHETERIZATION N/A 08/29/2016   Procedure: Coronary Stent Intervention;  Surgeon: [name], MD;  Location: [facility];  Service: Cardiovascular;  Laterality: N/A;  . CATARACT EXTRACTION, BILATERAL    . ICD IMPLANT N/A 08/28/2019   Procedure: ICD IMPLANT;  Surgeon: Constance Haw, MD;  Location: [facility];  Service: Cardiovascular;  Laterality: N/A;  . VAGINAL HYSTERECTOMY       Current Outpatient Medications  Medication Sig Dispense Refill  . acetaminophen (TYLENOL) 500 MG tablet Take 1,000 mg by mouth every 6 (six) hours as needed (for pain.).    Marland Kitchen alendronate (FOSAMAX) 70 MG tablet Take 1 tablet (70 mg total) by mouth once a week. Take with a full glass of water on an empty stomach. (Patient taking differently: Take 70 mg by mouth every Tuesday. Take with a full glass of water on an empty stomach. ) 12 tablet 3  . atorvastatin (LIPITOR) 80 MG tablet TAKE 1 TABLET(80 MG) BY MOUTH DAILY (Patient taking differently: Take 80  mg by mouth every evening. ) 90 tablet 1  . carvedilol (COREG) 6.25 MG tablet TAKE 1 TABLET(6.25 MG) BY MOUTH TWICE DAILY (Patient taking differently: Take 6.25 mg by mouth 2 (two) times daily. ) 180 tablet 3  . clopidogrel (PLAVIX) 75 MG tablet TAKE 1 TABLET(75 MG) BY MOUTH DAILY WITH BREAKFAST (Patient taking differently: Take 75 mg by mouth daily. TAKE 1 TABLET(75 MG) BY MOUTH DAILY WITH BREAKFAST)  90 tablet 3  . furosemide (LASIX) 20 MG tablet TAKE 1 TABLET BY MOUTH EVERY MONDAY, WEDNESDAY AND FRIDAY 45 tablet 2  . levothyroxine (SYNTHROID, LEVOTHROID) 25 MCG tablet Take 37.5 mcg by mouth daily before breakfast. Take 1.5 tablets(37.5mg  total) by mouth once daily.    Marland Kitchen losartan (COZAAR) 100 MG tablet Take 1 tablet (100 mg total) by mouth daily. 90 tablet 1  . nitroGLYCERIN (NITROSTAT) 0.4 MG SL tablet Place 1 tablet (0.4 mg total) under the tongue every 5 (five) minutes x 3 doses as needed for chest pain. 75 tablet 2  . ranolazine (RANEXA) 1000 MG SR tablet Take 1 tablet (1,000 mg total) by mouth 2 (two) times daily. 180 tablet 2   No current facility-administered medications for this visit.    Allergies:   Lisinopril and Spironolactone   Social History:  The patient  reports that she quit smoking about 3 years ago. Her smoking use included cigarettes. She has a 50.00 pack-year smoking history. She has never used smokeless tobacco. She reports current alcohol use. She reports that she does not use drugs.   Family History:  The patient's family history includes Heart disease in her mother and sister; Kidney failure in her mother.    ROS:  Please see the history of present illness.   Otherwise, review of systems is positive for none.   All other systems are reviewed and negative.   PHYSICAL EXAM: VS:  BP (!) 114/52   Pulse 67   Ht 5\' 3"  (1.6 m)   Wt 124 lb (56.2 kg)   BMI 21.97 kg/m  , BMI Body mass index is 21.97 kg/m. GEN: Well nourished, well developed, in no acute distress  HEENT: normal  Neck: no JVD, carotid bruits, or masses Cardiac: RRR; no murmurs, rubs, or gallops,no edema  Respiratory:  clear to auscultation bilaterally, normal work of breathing GI: soft, nontender, nondistended, + BS MS: no deformity or atrophy  Skin: warm and dry, device site well healed Neuro:  Strength and sensation are intact Psych: euthymic mood, full affect  EKG:  EKG is ordered  today. Personal review of the ekg ordered shows sinus rhythm, rate 67  Personal review of the device interrogation today. Results in [facility]: 08/19/2019: Hemoglobin 13.6; Platelets 333 10/02/2019: BUN 13; Creatinine, Ser 0.90; Potassium 4.8; Sodium 127    Lipid Panel     Component Value Date/Time   CHOL 153 03/01/2018 0801   TRIG 41 03/01/2018 0801   HDL 93 03/01/2018 0801   CHOLHDL 1.6 03/01/2018 0801   CHOLHDL 2.0 07/07/2016 1914   VLDL 17 07/07/2016 1914   LDLCALC 52 03/01/2018 0801     Wt Readings from Last 3 Encounters:  11/28/19 124 lb (56.2 kg)  11/04/19 123 lb 12.8 oz (56.2 kg)  10/08/19 124 lb 6.4 oz (56.4 kg)      Other studies Reviewed: Additional studies/ records that were reviewed today include: TTE 06/19/19  Review of the above records today demonstrates:   1. Left ventricular ejection fraction, by visual estimation,  is 25 to 30%. The left ventricle has severely decreased function. Left ventricular septal wall thickness was normal. There is no left ventricular hypertrophy.  2. Severely dilated left ventricular internal cavity size.  3. Mid and apical segments severely hypokinetic Preserved basal funciton.  4. Global right ventricle has normal systolic function.The right ventricular size is normal. No increase in right ventricular wall thickness.  5. Left atrial size was mildly dilated.  6. Right atrial size was normal.  7. The mitral valve is normal in structure. Mild mitral valve regurgitation. No evidence of mitral stenosis.  8. The tricuspid valve is normal in structure. Tricuspid valve regurgitation is not demonstrated.  9. The aortic valve is normal in structure. Aortic valve regurgitation is not visualized. No evidence of aortic valve sclerosis or stenosis. 10. The pulmonic valve was normal in structure. Pulmonic valve regurgitation is not visualized. 11. Mildly elevated pulmonary artery systolic pressure. 12. The inferior vena cava is normal  in size with greater than 50% respiratory variability, suggesting right atrial pressure of 3 mmHg.   ASSESSMENT AND PLAN:  1.  Chronic systolic heart failure due to ischemic cardiomyopathy: Currently on losartan and carvedilol.  Was unable to tolerate Entresto.  She is now status post Medtronic ICD implanted 08/28/2019.  Device functioning appropriately.  No changes.  2.  Coronary artery disease: Currently on Plavix, beta-blocker, statin, Ranexa.  No current chest pain.   3.  Hyperlipidemia: Continue Zetia and statin per primary cardiology  4.  Hypertension: Currently well controlled  Current medicines are reviewed at length with the patient today.   The patient does not have concerns regarding her medicines.  The following changes were made today: none  Labs/ tests ordered today include:  Orders Placed This Encounter  Procedures  . EKG 12-Lead    Disposition:   FU with Blaise Palladino  months  Signed, Salahuddin Arismendez Meredith Leeds, MD  11/28/2019 2:06 PM     [address] [place] [address] [phone] (office) [phone] (fax)

## 2019-11-29 NOTE — Progress Notes (Signed)
ICD Remote  

## 2019-12-02 ENCOUNTER — Other Ambulatory Visit: Payer: Self-pay

## 2019-12-02 DIAGNOSIS — I5022 Chronic systolic (congestive) heart failure: Secondary | ICD-10-CM

## 2019-12-02 MED ORDER — RANOLAZINE ER 1000 MG PO TB12: 1000.0000 mg | ORAL_TABLET | Freq: Two times a day (BID) | ORAL | 3 refills | Status: DC

## 2019-12-31 ENCOUNTER — Other Ambulatory Visit: Payer: Federal, State, Local not specified - PPO

## 2020-01-03 ENCOUNTER — Ambulatory Visit: Payer: Federal, State, Local not specified - PPO | Admitting: Endocrinology

## 2020-01-09 ENCOUNTER — Ambulatory Visit: Payer: Federal, State, Local not specified - PPO | Admitting: Endocrinology

## 2020-01-13 ENCOUNTER — Other Ambulatory Visit: Payer: Self-pay | Admitting: *Deleted

## 2020-01-13 DIAGNOSIS — I25119 Atherosclerotic heart disease of native coronary artery with unspecified angina pectoris: Secondary | ICD-10-CM

## 2020-01-13 DIAGNOSIS — I5022 Chronic systolic (congestive) heart failure: Secondary | ICD-10-CM

## 2020-01-13 MED ORDER — CARVEDILOL 6.25 MG PO TABS: ORAL_TABLET | ORAL | 2 refills | Status: DC

## 2020-01-14 ENCOUNTER — Ambulatory Visit: Payer: Federal, State, Local not specified - PPO | Admitting: Endocrinology

## 2020-01-14 ENCOUNTER — Other Ambulatory Visit: Payer: Self-pay | Admitting: Endocrinology

## 2020-01-14 ENCOUNTER — Other Ambulatory Visit: Payer: Self-pay

## 2020-01-14 ENCOUNTER — Other Ambulatory Visit (INDEPENDENT_AMBULATORY_CARE_PROVIDER_SITE_OTHER): Payer: Federal, State, Local not specified - PPO

## 2020-01-14 DIAGNOSIS — E039 Hypothyroidism, unspecified: Secondary | ICD-10-CM | POA: Diagnosis not present

## 2020-01-14 DIAGNOSIS — E213 Hyperparathyroidism, unspecified: Secondary | ICD-10-CM

## 2020-01-14 DIAGNOSIS — M810 Age-related osteoporosis without current pathological fracture: Secondary | ICD-10-CM

## 2020-01-14 LAB — COMPREHENSIVE METABOLIC PANEL
ALT: 20 U/L (ref 0–35)
AST: 18 U/L (ref 0–37)
Albumin: 4.2 g/dL (ref 3.5–5.2)
Alkaline Phosphatase: 50 U/L (ref 39–117)
BUN: 9 mg/dL (ref 6–23)
CO2: 28 mEq/L (ref 19–32)
Calcium: 11 mg/dL — ABNORMAL HIGH (ref 8.4–10.5)
Chloride: 95 mEq/L — ABNORMAL LOW (ref 96–112)
Creatinine, Ser: 0.79 mg/dL (ref 0.40–1.20)
GFR: 72.27 mL/min (ref >60.00)
Glucose, Bld: 95 mg/dL (ref 70–99)
Potassium: 4.7 mEq/L (ref 3.5–5.1)
Sodium: 125 mEq/L — ABNORMAL LOW (ref 135–145)
Total Bilirubin: 0.5 mg/dL (ref 0.2–1.2)
Total Protein: 6.7 g/dL (ref 6.0–8.3)

## 2020-01-14 LAB — TSH: TSH: 2.01 u[IU]/mL (ref 0.35–4.50)

## 2020-01-16 ENCOUNTER — Encounter: Payer: Self-pay | Admitting: Endocrinology

## 2020-01-16 ENCOUNTER — Ambulatory Visit: Payer: Federal, State, Local not specified - PPO | Admitting: Endocrinology

## 2020-01-16 ENCOUNTER — Other Ambulatory Visit: Payer: Self-pay

## 2020-01-16 VITALS — BP 124/72 | HR 64 | Ht 63.0 in | Wt 124.2 lb

## 2020-01-16 DIAGNOSIS — E213 Hyperparathyroidism, unspecified: Secondary | ICD-10-CM | POA: Diagnosis not present

## 2020-01-16 DIAGNOSIS — E871 Hypo-osmolality and hyponatremia: Secondary | ICD-10-CM

## 2020-01-16 DIAGNOSIS — M810 Age-related osteoporosis without current pathological fracture: Secondary | ICD-10-CM

## 2020-01-16 NOTE — Progress Notes (Signed)
Patient ID: Pam [name], female   DOB: 01/16/1951, 69 y.o.   MRN: [id number]                  Chief complaint: Follow-up  History of Present Illness:   Referring PCP: Drosinis, Wynona Dove, PA-C  HYPERCALCEMIA:   Review of records show that she has had a high calcium since 05/10/2018 when her calcium was 10.6. Previously highest calcium was 10.2 done in 2018 She had been referred here because of the finding of a PTH of 107 done in 05/2019  Calcium has leveled off at 11  She thinks her sister had surgery for similar problem but she cannot be sure whether it is a thyroid or parathyroid problem  Lab Results  Component Value Date   CALCIUM 11.0 (H) 01/14/2020   CALCIUM 11.0 (H) 10/02/2019   CALCIUM 10.8 (H) 08/19/2019   CALCIUM 9.6 09/29/2017   CALCIUM 9.5 08/21/2017   CALCIUM 9.9 03/21/2017   CALCIUM 9.9 03/03/2017   CALCIUM 10.2 02/17/2017   CALCIUM 9.1 08/30/2016    The hypercalcemia is not associated with any low trauma fractures, renal insufficiency, nephrolithiasis, sarcoidosis, known carcinoma  OSTEOPOROSIS:   Screening bone density showed osteoporosis at the hip and radius as of 07/2019  Since then she has been started on alendronate 70 mg weekly  She does follow instructions on taking this on empty stomach every week but is asking about why this needs to be done  She does not take calcium regularly  Has taken vitamin D supplements using 1000 units of vitamin D3 as directed   Lumbar spine L1-L3 (L4) Femoral neck (FN) 33% distal radius Ultra distal radius  T-score -1.0 RFN: -2.8 LFN: -2.4  -1.6  -2.7     25 (OH) Vitamin D level as follows:  Lab Results  Component Value Date   VD25OH 31.81 10/02/2019   HYPONATREMIA: See review of systems   Allergies as of 01/16/2020      Reactions   Lisinopril    Dry cough   Spironolactone Other (See Comments)   Low sodium      Medication List       Accurate as of January 16, 2020 11:59 PM. If you have any  questions, ask your nurse or doctor.        acetaminophen 500 MG tablet Commonly known as: TYLENOL Take 1,000 mg by mouth every 6 (six) hours as needed (for pain.).   alendronate 70 MG tablet Commonly known as: FOSAMAX Take 1 tablet (70 mg total) by mouth once a week. Take with a full glass of water on an empty stomach. What changed: when to take this   atorvastatin 80 MG tablet Commonly known as: LIPITOR TAKE 1 TABLET(80 MG) BY MOUTH DAILY What changed: See the new instructions.   carvedilol 6.25 MG tablet Commonly known as: COREG TAKE 1 TABLET(6.25 MG) BY MOUTH TWICE DAILY   clopidogrel 75 MG tablet Commonly known as: PLAVIX TAKE 1 TABLET(75 MG) BY MOUTH DAILY WITH BREAKFAST What changed:   how much to take  how to take this  when to take this   furosemide 20 MG tablet Commonly known as: LASIX TAKE 1 TABLET BY MOUTH EVERY MONDAY, WEDNESDAY AND FRIDAY   levothyroxine 25 MCG tablet Commonly known as: SYNTHROID Take 25 mcg by mouth daily before breakfast. Take 1.5 tablets (37.5mg  total) by mouth once daily. What changed: Another medication with the same name was removed. Continue taking this medication, and follow the directions you  see here. Changed by: Elayne Snare, MD   losartan 100 MG tablet Commonly known as: COZAAR Take 1 tablet (100 mg total) by mouth daily.   nitroGLYCERIN 0.4 MG SL tablet Commonly known as: NITROSTAT Place 1 tablet (0.4 mg total) under the tongue every 5 (five) minutes x 3 doses as needed for chest pain.   ranolazine 1000 MG SR tablet Commonly known as: Ranexa Take 1 tablet (1,000 mg total) by mouth 2 (two) times daily.       Allergies:  Allergies  Allergen Reactions  . Lisinopril     Dry cough  . Spironolactone Other (See Comments)    Low sodium    Past Medical History:  Diagnosis Date  . Abnormal EKG   . Alcoholic intoxication without complication ([place])   . Allergy   . Cardiomyopathy, ischemic   . Cataract   . Chest  pain 08/29/2016  . CHF (congestive heart failure) ([place])   . Chronic systolic heart failure ([place]) 08/29/2016  . Coronary artery disease of native artery of native heart with stable angina pectoris ([place])   . Facial laceration   . Fall 07/07/2016  . Hyperlipidemia   . Hypertension 03/03/2017  . Hyponatremia   . Hypothyroidism 07/08/2016  . NSTEMI (non-ST elevated myocardial infarction) ([place])   . Syncope   . Thyroid disease   . Unstable angina ([place]) 08/29/2016    Past Surgical History:  Procedure Laterality Date  . CARDIAC CATHETERIZATION N/A 07/08/2016   Procedure: Left Heart Cath and Coronary Angiography;  Surgeon: [name], MD;  Location: [place];  Service: Cardiovascular;  Laterality: N/A;  . CARDIAC CATHETERIZATION N/A 08/29/2016   Procedure: Coronary Atherectomy;  Surgeon: [name], MD;  Location: [facility];  Service: Cardiovascular;  Laterality: N/A;  . CARDIAC CATHETERIZATION N/A 08/29/2016   Procedure: Coronary Stent Intervention;  Surgeon: [name], MD;  Location: [facility];  Service: Cardiovascular;  Laterality: N/A;  . CATARACT EXTRACTION, BILATERAL    . ICD IMPLANT N/A 08/28/2019   Procedure: ICD IMPLANT;  Surgeon: Constance Haw, MD;  Location: [place];  Service: Cardiovascular;  Laterality: N/A;  . VAGINAL HYSTERECTOMY      Family History  Problem Relation Age of Onset  . Kidney failure Mother   . Heart disease Mother        CHF  . Heart disease Sister   . Colon cancer Neg Hx   . Esophageal cancer Neg Hx   . Rectal cancer Neg Hx   . Stomach cancer Neg Hx     Social History:  reports that she quit smoking about 3 years ago. Her smoking use included cigarettes. She has a 50.00 pack-year smoking history. She has never used smokeless tobacco. She reports current alcohol use. She reports that she does not use drugs.  Review of Systems  She has mild hypothyroidism, apparently asymptomatic She has been  prescribed levothyroxine 37.5 mcg daily by her PCP  Lab Results  Component Value Date   TSH 2.01 01/14/2020     HYPONATREMIA: She has had long-term hyponatremia of unclear etiology, not on thiazide diuretics except Lasix Also not on any other drugs causing hyponatremia She does try to drink a lot of water and has not cut back even though she was told to do so Previously urine osmolality was relatively high at 332  Has not had any recent problems with nausea or weight loss  Sodium is relatively lower now  Wt Readings  from Last 3 Encounters:  01/16/20 124 lb 3.2 oz (56.3 kg)  11/28/19 124 lb (56.2 kg)  11/04/19 123 lb 12.8 oz (56.2 kg)   Lab Results  Component Value Date   NA 125 (L) 01/14/2020   K 4.7 01/14/2020   CL 95 (L) 01/14/2020   CO2 28 01/14/2020     EXAM:  BP 124/72 (BP Location: Left Arm, Patient Position: Sitting, Cuff Size: Normal)   Pulse 64   Ht 5\' 3"  (1.6 m)   Wt 124 lb 3.2 oz (56.3 kg)   SpO2 97%   BMI 22.00 kg/m    No ankle edema present   Assessment/Plan:   HYPERCALCEMIA:  She has had mild hypercalcemia secondary to primary hyperparathyroidism since at least 2019 with the highest level now 11.0 and stable  She is again asymptomatic She does have osteoporosis  Not clear if her osteoporosis is mostly postmenopausal or partly caused by hyperparathyroidism We will need to assess efficacy of her bisphosphonate treatment when bone density is due  Currently with her cardiac history is not a good candidate for surgery  If she has any symptoms, rising calcium above 11.5 or height loss as well as decrease in bone density in the future may consider parathyroid surgery  She will have calcium levels were done regularly  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   Elayne Snare 01/17/2020, 8:51 AM

## 2020-02-04 ENCOUNTER — Other Ambulatory Visit: Payer: Self-pay | Admitting: Cardiology

## 2020-02-27 ENCOUNTER — Ambulatory Visit (INDEPENDENT_AMBULATORY_CARE_PROVIDER_SITE_OTHER): Payer: Federal, State, Local not specified - PPO | Admitting: *Deleted

## 2020-02-27 DIAGNOSIS — I5022 Chronic systolic (congestive) heart failure: Secondary | ICD-10-CM | POA: Diagnosis not present

## 2020-02-27 DIAGNOSIS — I255 Ischemic cardiomyopathy: Secondary | ICD-10-CM

## 2020-02-27 LAB — CUP PACEART REMOTE DEVICE CHECK
Battery Remaining Longevity: 135 mo
Battery Voltage: 3.1 V
Brady Statistic RV Percent Paced: 0.01 %
Date Time Interrogation Session: 20210729033322
HighPow Impedance: 60 Ohm
Implantable Lead Implant Date: 20210127
Implantable Lead Location: 753860
Implantable Pulse Generator Implant Date: 20210127
Lead Channel Impedance Value: 323 Ohm
Lead Channel Impedance Value: 380 Ohm
Lead Channel Pacing Threshold Amplitude: 0.75 V
Lead Channel Pacing Threshold Pulse Width: 0.4 ms
Lead Channel Sensing Intrinsic Amplitude: 7.5 mV
Lead Channel Sensing Intrinsic Amplitude: 7.5 mV
Lead Channel Setting Pacing Amplitude: 2 V
Lead Channel Setting Pacing Pulse Width: 0.4 ms
Lead Channel Setting Sensing Sensitivity: 0.3 mV

## 2020-03-02 NOTE — Progress Notes (Signed)
Remote ICD transmission.   

## 2020-03-16 ENCOUNTER — Other Ambulatory Visit (INDEPENDENT_AMBULATORY_CARE_PROVIDER_SITE_OTHER): Payer: Federal, State, Local not specified - PPO

## 2020-03-16 ENCOUNTER — Other Ambulatory Visit: Payer: Self-pay

## 2020-03-16 DIAGNOSIS — E871 Hypo-osmolality and hyponatremia: Secondary | ICD-10-CM | POA: Diagnosis not present

## 2020-03-16 LAB — BASIC METABOLIC PANEL
BUN: 10 mg/dL (ref 6–23)
CO2: 27 mEq/L (ref 19–32)
Calcium: 11.1 mg/dL — ABNORMAL HIGH (ref 8.4–10.5)
Chloride: 97 mEq/L (ref 96–112)
Creatinine, Ser: 0.94 mg/dL (ref 0.40–1.20)
GFR: 59.1 mL/min — ABNORMAL LOW (ref >60.00)
Glucose, Bld: 97 mg/dL (ref 70–99)
Potassium: 4.5 mEq/L (ref 3.5–5.1)
Sodium: 131 mEq/L — ABNORMAL LOW (ref 135–145)

## 2020-03-19 ENCOUNTER — Encounter: Payer: Self-pay | Admitting: Endocrinology

## 2020-03-19 ENCOUNTER — Ambulatory Visit (INDEPENDENT_AMBULATORY_CARE_PROVIDER_SITE_OTHER): Payer: Federal, State, Local not specified - PPO | Admitting: Endocrinology

## 2020-03-19 ENCOUNTER — Other Ambulatory Visit: Payer: Self-pay

## 2020-03-19 VITALS — BP 130/70 | HR 66 | Ht 63.0 in | Wt 121.2 lb

## 2020-03-19 DIAGNOSIS — E871 Hypo-osmolality and hyponatremia: Secondary | ICD-10-CM | POA: Diagnosis not present

## 2020-03-19 DIAGNOSIS — E039 Hypothyroidism, unspecified: Secondary | ICD-10-CM | POA: Diagnosis not present

## 2020-03-19 DIAGNOSIS — E213 Hyperparathyroidism, unspecified: Secondary | ICD-10-CM

## 2020-03-19 NOTE — Progress Notes (Signed)
Patient ID: Pam [name], female   DOB: 06-Feb-1951, 69 y.o.   MRN: [id number]                  Chief complaint: Follow-up of low sodium and other questions  History of Present Illness:   Referring PCP: Drosinis, Wynona Dove, PA-C  HYPERCALCEMIA:   Review of records show that she has had a high calcium since 05/10/2018 when her calcium was 10.6. Previously highest calcium was 10.2 done in 2018 She had been referred here because of the finding of a PTH of 107 done in 05/2019  Calcium has been persistently high but about the same at 11.1  Patient reports that her sister had neck surgery but she cannot be sure whether it is a thyroid or parathyroid problem  Lab Results  Component Value Date   CALCIUM 11.1 (H) 03/16/2020   CALCIUM 11.0 (H) 01/14/2020   CALCIUM 11.0 (H) 10/02/2019   CALCIUM 10.8 (H) 08/19/2019   CALCIUM 9.6 09/29/2017   CALCIUM 9.5 08/21/2017   CALCIUM 9.9 03/21/2017   CALCIUM 9.9 03/03/2017   CALCIUM 10.2 02/17/2017    The hypercalcemia is not associated with any low trauma fractures, renal insufficiency, nephrolithiasis, sarcoidosis, known carcinoma  OSTEOPOROSIS:  Screening bone density showed osteoporosis at the hip and radius as of 07/2019  Since then she has been started on alendronate 70 mg weekly  She does follow instructions on taking this on empty stomach every week but is asking the nature of this medication  She does not take calcium regularly  Has taken vitamin D supplements using 1000 units of vitamin D3 as directed with adequate levels   Lumbar spine L1-L3 (L4) Femoral neck (FN) 33% distal radius Ultra distal radius  T-score -1.0 RFN: -2.8 LFN: -2.4  -1.6  -2.7     25 (OH) Vitamin D level as follows:  Lab Results  Component Value Date   VD25OH 31.81 10/02/2019   HYPONATREMIA: See review of systems   Allergies as of 03/19/2020      Reactions   Lisinopril    Dry cough   Spironolactone Other (See Comments)   Low sodium       Medication List       Accurate as of March 19, 2020  9:07 AM. If you have any questions, ask your nurse or doctor.        acetaminophen 500 MG tablet Commonly known as: TYLENOL Take 1,000 mg by mouth every 6 (six) hours as needed (for pain.).   alendronate 70 MG tablet Commonly known as: FOSAMAX Take 1 tablet (70 mg total) by mouth once a week. Take with a full glass of water on an empty stomach. What changed: when to take this   atorvastatin 80 MG tablet Commonly known as: LIPITOR TAKE 1 TABLET(80 MG) BY MOUTH DAILY What changed: See the new instructions.   carvedilol 6.25 MG tablet Commonly known as: COREG TAKE 1 TABLET(6.25 MG) BY MOUTH TWICE DAILY   clopidogrel 75 MG tablet Commonly known as: PLAVIX TAKE 1 TABLET(75 MG) BY MOUTH DAILY WITH BREAKFAST What changed:   how much to take  how to take this  when to take this   furosemide 20 MG tablet Commonly known as: LASIX TAKE 1 TABLET BY MOUTH EVERY MONDAY, WEDNESDAY AND FRIDAY   levothyroxine 25 MCG tablet Commonly known as: SYNTHROID Take 25 mcg by mouth daily before breakfast. Take 1.5 tablets (37.5mg  total) by mouth once daily.   losartan 100 MG tablet Commonly  known as: COZAAR TAKE 1 TABLET(100 MG TOTAL) BEFORE Y MOUTH DAILY.   nitroGLYCERIN 0.4 MG SL tablet Commonly known as: NITROSTAT Place 1 tablet (0.4 mg total) under the tongue every 5 (five) minutes x 3 doses as needed for chest pain.   ranolazine 1000 MG SR tablet Commonly known as: Ranexa Take 1 tablet (1,000 mg total) by mouth 2 (two) times daily.       Allergies:  Allergies  Allergen Reactions  . Lisinopril     Dry cough  . Spironolactone Other (See Comments)    Low sodium    Past Medical History:  Diagnosis Date  . Abnormal EKG   . Alcoholic intoxication without complication ([place])   . Allergy   . Cardiomyopathy, ischemic   . Cataract   . Chest pain 08/29/2016  . CHF (congestive heart failure) ([place])   . Chronic systolic  heart failure ([place]) 08/29/2016  . Coronary artery disease of native artery of native heart with stable angina pectoris ([place])   . Facial laceration   . Fall 07/07/2016  . Hyperlipidemia   . Hypertension 03/03/2017  . Hyponatremia   . Hypothyroidism 07/08/2016  . NSTEMI (non-ST elevated myocardial infarction) ([place])   . Syncope   . Thyroid disease   . Unstable angina ([place]) 08/29/2016    Past Surgical History:  Procedure Laterality Date  . CARDIAC CATHETERIZATION N/A 07/08/2016   Procedure: Left Heart Cath and Coronary Angiography;  Surgeon: [name], MD;  Location: [facility];  Service: Cardiovascular;  Laterality: N/A;  . CARDIAC CATHETERIZATION N/A 08/29/2016   Procedure: Coronary Atherectomy;  Surgeon: [name], MD;  Location: [facility];  Service: Cardiovascular;  Laterality: N/A;  . CARDIAC CATHETERIZATION N/A 08/29/2016   Procedure: Coronary Stent Intervention;  Surgeon: [name], MD;  Location: [facility];  Service: Cardiovascular;  Laterality: N/A;  . CATARACT EXTRACTION, BILATERAL    . ICD IMPLANT N/A 08/28/2019   Procedure: ICD IMPLANT;  Surgeon: Constance Haw, MD;  Location: [facility];  Service: Cardiovascular;  Laterality: N/A;  . VAGINAL HYSTERECTOMY      Family History  Problem Relation Age of Onset  . Kidney failure Mother   . Heart disease Mother        CHF  . Heart disease Sister   . Colon cancer Neg Hx   . Esophageal cancer Neg Hx   . Rectal cancer Neg Hx   . Stomach cancer Neg Hx     Social History:  reports that she quit smoking about 3 years ago. Her smoking use included cigarettes. She has a 50.00 pack-year smoking history. She has never used smokeless tobacco. She reports current alcohol use. She reports that she does not use drugs.  Review of Systems  She has mild hypothyroidism, apparently asymptomatic She has been prescribed levothyroxine 37.5 mcg daily by her PCP  Lab Results  Component Value  Date   TSH 2.01 01/14/2020     HYPONATREMIA: She has had long-term hyponatremia of unclear etiology, not on thiazide diuretics except Lasix Also not on any other drugs causing hyponatremia Previously urine osmolality was relatively high at 332 and urine sodium this year 39  Has not had any recent problems with nausea or change in appetite She was told to cut back on her water intake at least 30% and she thinks she is reducing her intake significantly  Sodium is relatively improved at 131 compared to 125 now  Wt Readings from Last  3 Encounters:  03/19/20 121 lb 3.2 oz (55 kg)  01/16/20 124 lb 3.2 oz (56.3 kg)  11/28/19 124 lb (56.2 kg)   Lab Results  Component Value Date   NA 131 (L) 03/16/2020   K 4.5 03/16/2020   CL 97 03/16/2020   CO2 27 03/16/2020     EXAM:  BP 130/70 (BP Location: Left Arm, Patient Position: Sitting, Cuff Size: Normal)   Pulse 66   Ht 5\' 3"  (1.6 m)   Wt 121 lb 3.2 oz (55 kg)   SpO2 94%   BMI 21.47 kg/m    No ankle edema present   Assessment/Plan:   HYPERCALCEMIA:  She has had stable hypercalcemia secondary to primary hyperparathyroidism since at least 2019 with the highest level 11.1  Osteoporosis: Treated with alendronate  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   Elayne Snare 03/19/2020, 9:07 AM

## 2020-05-28 ENCOUNTER — Ambulatory Visit (INDEPENDENT_AMBULATORY_CARE_PROVIDER_SITE_OTHER): Payer: Federal, State, Local not specified - PPO

## 2020-05-28 DIAGNOSIS — I255 Ischemic cardiomyopathy: Secondary | ICD-10-CM | POA: Diagnosis not present

## 2020-05-28 LAB — CUP PACEART REMOTE DEVICE CHECK
Battery Remaining Longevity: 134 mo
Battery Voltage: 3.06 V
Brady Statistic RV Percent Paced: 0.02 %
Date Time Interrogation Session: 20211028012404
HighPow Impedance: 62 Ohm
Implantable Lead Implant Date: 20210127
Implantable Lead Location: 753860
Implantable Pulse Generator Implant Date: 20210127
Lead Channel Impedance Value: 304 Ohm
Lead Channel Impedance Value: 380 Ohm
Lead Channel Pacing Threshold Amplitude: 0.75 V
Lead Channel Pacing Threshold Pulse Width: 0.4 ms
Lead Channel Sensing Intrinsic Amplitude: 8.25 mV
Lead Channel Sensing Intrinsic Amplitude: 8.25 mV
Lead Channel Setting Pacing Amplitude: 2 V
Lead Channel Setting Pacing Pulse Width: 0.4 ms
Lead Channel Setting Sensing Sensitivity: 0.3 mV

## 2020-06-02 NOTE — Progress Notes (Signed)
Remote ICD transmission.   

## 2020-07-30 ENCOUNTER — Other Ambulatory Visit: Payer: Self-pay

## 2020-07-30 MED ORDER — CLOPIDOGREL BISULFATE 75 MG PO TABS: ORAL_TABLET | ORAL | 0 refills | Status: DC

## 2020-08-02 ENCOUNTER — Other Ambulatory Visit: Payer: Self-pay | Admitting: Physician Assistant

## 2020-08-25 ENCOUNTER — Encounter: Payer: Self-pay | Admitting: Cardiology

## 2020-08-25 ENCOUNTER — Other Ambulatory Visit: Payer: Self-pay

## 2020-08-25 ENCOUNTER — Ambulatory Visit: Payer: Federal, State, Local not specified - PPO | Admitting: Cardiology

## 2020-08-25 VITALS — BP 140/68 | HR 62 | Ht 63.0 in | Wt 126.2 lb

## 2020-08-25 DIAGNOSIS — I255 Ischemic cardiomyopathy: Secondary | ICD-10-CM

## 2020-08-25 NOTE — Progress Notes (Signed)
Electrophysiology Office Note   Date:  08/25/2020   ID:  Pam [name], DOB 22-Jan-1951, MRN [id number]  PCP:  Drosinis, [name], PA-C  Cardiologist:  Angelena Form Primary Electrophysiologist:  Denisse Whitenack Meredith Leeds, MD    Chief Complaint: CHF   History of Present Illness: Pam [name] is a 70 y.o. female who is being seen today for the evaluation of CHF at the request of Drosinis, [name], PA-C. Presenting today for electrophysiology evaluation.  She has a history of coronary artery disease, ischemic cardiomyopathy, chronic systolic heart failure, hypertension, hyperlipidemia.  She had a non-STEMI December 2017 and was found to have a chronic total occlusion of the mid LAD.  She had severe calcific stenosis of the circumflex.  Cardiac MRI showed nonviability of the LAD territory.  Her ejection fraction was 38%.  She had stenting of her circumflex January 2018.  She is now status post Medtronic ICD implanted 08/28/2019.  Today, denies symptoms of palpitations, chest pain, shortness of breath, orthopnea, PND, lower extremity edema, claudication, dizziness, presyncope, syncope, bleeding, or neurologic sequela. The patient is tolerating medications without difficulties.  She currently feels well.  She does get mildly fatigued when she walks long distances, but otherwise has been doing well.  She has had no chest pain.   Past Medical History:  Diagnosis Date  . Abnormal EKG   . Alcoholic intoxication without complication ([place])   . Allergy   . Cardiomyopathy, ischemic   . Cataract   . Chest pain 08/29/2016  . CHF (congestive heart failure) ([place])   . Chronic systolic heart failure ([place]) 08/29/2016  . Coronary artery disease of native artery of native heart with stable angina pectoris ([place])   . Facial laceration   . Fall 07/07/2016  . Hyperlipidemia   . Hypertension 03/03/2017  . Hyponatremia   . Hypothyroidism 07/08/2016  . NSTEMI (non-ST elevated myocardial infarction) ([place])   . Syncope   .  Thyroid disease   . Unstable angina ([place]) 08/29/2016   Past Surgical History:  Procedure Laterality Date  . CARDIAC CATHETERIZATION N/A 07/08/2016   Procedure: Left Heart Cath and Coronary Angiography;  Surgeon: [name], MD;  Location: [facility];  Service: Cardiovascular;  Laterality: N/A;  . CARDIAC CATHETERIZATION N/A 08/29/2016   Procedure: Coronary Atherectomy;  Surgeon: [name], MD;  Location: [facility];  Service: Cardiovascular;  Laterality: N/A;  . CARDIAC CATHETERIZATION N/A 08/29/2016   Procedure: Coronary Stent Intervention;  Surgeon: [name], MD;  Location: [facility];  Service: Cardiovascular;  Laterality: N/A;  . CATARACT EXTRACTION, BILATERAL    . ICD IMPLANT N/A 08/28/2019   Procedure: ICD IMPLANT;  Surgeon: Constance Haw, MD;  Location: [facility];  Service: Cardiovascular;  Laterality: N/A;  . VAGINAL HYSTERECTOMY       Current Outpatient Medications  Medication Sig Dispense Refill  . acetaminophen (TYLENOL) 500 MG tablet Take 1,000 mg by mouth every 6 (six) hours as needed (for pain.).    Marland Kitchen alendronate (FOSAMAX) 70 MG tablet Take 1 tablet (70 mg total) by mouth once a week. Take with a full glass of water on an empty stomach. 12 tablet 3  . atorvastatin (LIPITOR) 80 MG tablet TAKE 1 TABLET(80 MG) BY MOUTH DAILY 90 tablet 1  . carvedilol (COREG) 6.25 MG tablet TAKE 1 TABLET(6.25 MG) BY MOUTH TWICE DAILY 180 tablet 2  . clopidogrel (PLAVIX) 75 MG tablet TAKE 1 TABLET(75 MG) BY MOUTH DAILY WITH BREAKFAST - Pt  must keep appt in Jan for further refills 60 tablet 0  . furosemide (LASIX) 20 MG tablet TAKE 1 TABLET BY MOUTH EVERY MONDAY, [facility] AND Friday. Please make yearly appt with Dr. Angelena Form for April 2022. Thanks 1st attempt 45 tablet 0  . levothyroxine (SYNTHROID) 25 MCG tablet Take 25 mcg by mouth daily before breakfast. Take 1.5 tablets (37.5mg  total) by mouth once daily.    Marland Kitchen losartan (COZAAR) 100 MG tablet  TAKE 1 TABLET(100 MG TOTAL) BEFORE Y MOUTH DAILY. 90 tablet 2  . nitroGLYCERIN (NITROSTAT) 0.4 MG SL tablet Place 1 tablet (0.4 mg total) under the tongue every 5 (five) minutes x 3 doses as needed for chest pain. 75 tablet 2  . ranolazine (RANEXA) 1000 MG SR tablet Take 1 tablet (1,000 mg total) by mouth 2 (two) times daily. 180 tablet 3   No current facility-administered medications for this visit.    Allergies:   Lisinopril and Spironolactone   Social History:  The patient  reports that she quit smoking about 4 years ago. Her smoking use included cigarettes. She has a 50.00 pack-year smoking history. She has never used smokeless tobacco. She reports current alcohol use. She reports that she does not use drugs.   Family History:  The patient's family history includes Heart disease in her mother and sister; Kidney failure in her mother.   ROS:  Please see the history of present illness.   Otherwise, review of systems is positive for none.   All other systems are reviewed and negative.   PHYSICAL EXAM: VS:  BP 140/68   Pulse 62   Ht 5\' 3"  (1.6 m)   Wt 126 lb 3.2 oz (57.2 kg)   SpO2 98%   BMI 22.36 kg/m  , BMI Body mass index is 22.36 kg/m. GEN: Well nourished, well developed, in no acute distress  HEENT: normal  Neck: no JVD, carotid bruits, or masses Cardiac: RRR; no murmurs, rubs, or gallops,no edema  Respiratory:  clear to auscultation bilaterally, normal work of breathing GI: soft, nontender, nondistended, + BS MS: no deformity or atrophy  Skin: warm and dry, device site well healed Neuro:  Strength and sensation are intact Psych: euthymic mood, full affect  EKG:  EKG is ordered today. Personal review of the ekg ordered shows sinus rhythm, T wave inversions, rate 62  Personal review of the device interrogation today. Results in [place]: 01/14/2020: ALT 20; TSH 2.01 03/16/2020: BUN 10; Creatinine, Ser 0.94; Potassium 4.5; Sodium 131    Lipid Panel      Component Value Date/Time   CHOL 153 03/01/2018 0801   TRIG 41 03/01/2018 0801   HDL 93 03/01/2018 0801   CHOLHDL 1.6 03/01/2018 0801   CHOLHDL 2.0 07/07/2016 1914   VLDL 17 07/07/2016 1914   LDLCALC 52 03/01/2018 0801     Wt Readings from Last 3 Encounters:  08/25/20 126 lb 3.2 oz (57.2 kg)  03/19/20 121 lb 3.2 oz (55 kg)  01/16/20 124 lb 3.2 oz (56.3 kg)      Other studies Reviewed: Additional studies/ records that were reviewed today include: TTE 06/19/19  Review of the above records today demonstrates:   1. Left ventricular ejection fraction, by visual estimation, is 25 to 30%. The left ventricle has severely decreased function. Left ventricular septal wall thickness was normal. There is no left ventricular hypertrophy.  2. Severely dilated left ventricular internal cavity size.  3. Mid and apical segments severely hypokinetic Preserved basal funciton.  4. Global right ventricle has normal systolic function.The right ventricular size is normal. No increase in right ventricular wall thickness.  5. Left atrial size was mildly dilated.  6. Right atrial size was normal.  7. The mitral valve is normal in structure. Mild mitral valve regurgitation. No evidence of mitral stenosis.  8. The tricuspid valve is normal in structure. Tricuspid valve regurgitation is not demonstrated.  9. The aortic valve is normal in structure. Aortic valve regurgitation is not visualized. No evidence of aortic valve sclerosis or stenosis. 10. The pulmonic valve was normal in structure. Pulmonic valve regurgitation is not visualized. 11. Mildly elevated pulmonary artery systolic pressure. 12. The inferior vena cava is normal in size with greater than 50% respiratory variability, suggesting right atrial pressure of 3 mmHg.   ASSESSMENT AND PLAN:  1.  Chronic systolic heart failure due to ischemic cardiomyopathy: Currently on losartan and carvedilol.  Was unable to tolerate Entresto.  Is now status post  Medtronic ICD implanted 08/28/2019.  Device is functioning appropriately.  No changes at this time.    2.  Coronary artery disease: Currently on Plavix, beta-blocker, statin, Ranexa.  No current chest pain.    3.  Hyperlipidemia: Continue Zetia and statin per primary cardiology.  4.  Hypertension: Blood pressure is mildly elevated.  It is usually better controlled.  No changes at this time.  Current medicines are reviewed at length with the patient today.   The patient does not have concerns regarding her medicines.  The following changes were made today: None  Labs/ tests ordered today include:  Orders Placed This Encounter  Procedures  . EKG 12-Lead    Disposition:   FU with Evadne Ose 12 months  Signed, Brendan Gadson Meredith Leeds, MD  08/25/2020 3:37 PM     [address] [place] [address] [phone] (office) [phone] (fax)

## 2020-08-27 ENCOUNTER — Other Ambulatory Visit: Payer: Self-pay

## 2020-08-27 ENCOUNTER — Telehealth: Payer: Self-pay | Admitting: Cardiology

## 2020-08-27 ENCOUNTER — Ambulatory Visit (INDEPENDENT_AMBULATORY_CARE_PROVIDER_SITE_OTHER): Payer: Federal, State, Local not specified - PPO

## 2020-08-27 DIAGNOSIS — I255 Ischemic cardiomyopathy: Secondary | ICD-10-CM | POA: Diagnosis not present

## 2020-08-27 LAB — CUP PACEART REMOTE DEVICE CHECK
Battery Remaining Longevity: 132 mo
Battery Voltage: 3.04 V
Brady Statistic RV Percent Paced: 0.03 %
Date Time Interrogation Session: 20220127043723
HighPow Impedance: 65 Ohm
Implantable Lead Implant Date: 20210127
Implantable Lead Location: 753860
Implantable Pulse Generator Implant Date: 20210127
Lead Channel Impedance Value: 304 Ohm
Lead Channel Impedance Value: 380 Ohm
Lead Channel Pacing Threshold Amplitude: 0.75 V
Lead Channel Pacing Threshold Pulse Width: 0.4 ms
Lead Channel Sensing Intrinsic Amplitude: 9 mV
Lead Channel Sensing Intrinsic Amplitude: 9 mV
Lead Channel Setting Pacing Amplitude: 2 V
Lead Channel Setting Pacing Pulse Width: 0.4 ms
Lead Channel Setting Sensing Sensitivity: 0.3 mV

## 2020-08-27 MED ORDER — NITROGLYCERIN 0.4 MG SL SUBL: 0.4000 mg | SUBLINGUAL_TABLET | SUBLINGUAL | 1 refills | Status: DC | PRN

## 2020-08-27 NOTE — Telephone Encounter (Signed)
Pt's medication was sent to pt's pharmacy as requested. Confirmation received.  °

## 2020-08-27 NOTE — Telephone Encounter (Signed)
Followed up with pt.  Advised that she didn't get her paperwork because the MD let he go while I was in another room. Aware that I will place the recall for one year and that she would get a reminder letter when time to make yearly appt. Aware I will mail the AVS to home address (address confirmed). Patient verbalized understanding and agreeable to plan.

## 2020-08-27 NOTE — Patient Instructions (Signed)
Medication Instructions:  Your physician recommends that you continue on your current medications as directed. Please refer to the Current Medication list given to you today.  *If you need a refill on your cardiac medications before your next appointment, please call your pharmacy*   Lab Work: None ordered   Testing/Procedures: None ordered   Follow-Up: At [facility], you and your health needs are our priority.  As part of our continuing mission to provide you with exceptional heart care, we have created designated Provider Care Teams.  These Care Teams include your primary Cardiologist (physician) and Advanced Practice Providers (APPs -  Physician Assistants and Nurse Practitioners) who all work together to provide you with the care you need, when you need it.  We recommend signing up for the patient portal called "MyChart".  Sign up information is provided on this After Visit Summary.  MyChart is used to connect with patients for Virtual Visits (Telemedicine).  Patients are able to view lab/test results, encounter notes, upcoming appointments, etc.  Non-urgent messages can be sent to your provider as well.   To learn more about what you can do with MyChart, go to [email or web address].    Remote monitoring is used to monitor your Pacemaker or ICD from home. This monitoring reduces the number of office visits required to check your device to one time per year. It allows [place] to keep an eye on the functioning of your device to ensure it is working properly. You are scheduled for a device check from home on 11/26/20. You may send your transmission at any time that day. If you have a wireless device, the transmission will be sent automatically. After your physician reviews your transmission, you will receive a postcard with your next transmission date.  Your next appointment:   1 year(s)  The format for your next appointment:   In Person  Provider:   Allegra Lai, MD   Thank you for  choosing [facility]!!   Trinidad Curet, RN [phone]

## 2020-08-27 NOTE — Telephone Encounter (Signed)
Pam [name] is calling stating she forgot to go by checkout after her appointment on 08/25/20 so she did not get any of her paperwork. She is requesting it be mailed to her to the address listed on file. Please advise.

## 2020-09-07 NOTE — Progress Notes (Signed)
Remote ICD transmission.   

## 2020-09-21 ENCOUNTER — Other Ambulatory Visit: Payer: Self-pay

## 2020-09-21 ENCOUNTER — Other Ambulatory Visit (INDEPENDENT_AMBULATORY_CARE_PROVIDER_SITE_OTHER): Payer: Federal, State, Local not specified - PPO

## 2020-09-21 DIAGNOSIS — E213 Hyperparathyroidism, unspecified: Secondary | ICD-10-CM

## 2020-09-21 DIAGNOSIS — E039 Hypothyroidism, unspecified: Secondary | ICD-10-CM | POA: Diagnosis not present

## 2020-09-21 DIAGNOSIS — E871 Hypo-osmolality and hyponatremia: Secondary | ICD-10-CM

## 2020-09-21 LAB — VITAMIN D 25 HYDROXY (VIT D DEFICIENCY, FRACTURES): VITD: 56.5 ng/mL (ref 30.00–100.00)

## 2020-09-21 LAB — COMPREHENSIVE METABOLIC PANEL
ALT: 17 U/L (ref 0–35)
AST: 16 U/L (ref 0–37)
Albumin: 4.2 g/dL (ref 3.5–5.2)
Alkaline Phosphatase: 52 U/L (ref 39–117)
BUN: 12 mg/dL (ref 6–23)
CO2: 29 mEq/L (ref 19–32)
Calcium: 11.9 mg/dL — ABNORMAL HIGH (ref 8.4–10.5)
Chloride: 95 mEq/L — ABNORMAL LOW (ref 96–112)
Creatinine, Ser: 0.88 mg/dL (ref 0.40–1.20)
GFR: 67.17 mL/min (ref >60.00)
Glucose, Bld: 96 mg/dL (ref 70–99)
Potassium: 5.3 mEq/L — ABNORMAL HIGH (ref 3.5–5.1)
Sodium: 128 mEq/L — ABNORMAL LOW (ref 135–145)
Total Bilirubin: 0.5 mg/dL (ref 0.2–1.2)
Total Protein: 6.9 g/dL (ref 6.0–8.3)

## 2020-09-21 LAB — TSH: TSH: 3.21 u[IU]/mL (ref 0.35–4.50)

## 2020-09-21 LAB — T4, FREE: Free T4: 0.95 ng/dL (ref 0.60–1.60)

## 2020-09-24 ENCOUNTER — Ambulatory Visit: Payer: Federal, State, Local not specified - PPO | Admitting: Endocrinology

## 2020-09-24 ENCOUNTER — Encounter: Payer: Self-pay | Admitting: Endocrinology

## 2020-09-24 VITALS — BP 130/70 | HR 64 | Ht 63.0 in | Wt 124.0 lb

## 2020-09-24 DIAGNOSIS — E213 Hyperparathyroidism, unspecified: Secondary | ICD-10-CM | POA: Diagnosis not present

## 2020-09-24 DIAGNOSIS — E871 Hypo-osmolality and hyponatremia: Secondary | ICD-10-CM

## 2020-09-24 DIAGNOSIS — E039 Hypothyroidism, unspecified: Secondary | ICD-10-CM | POA: Diagnosis not present

## 2020-09-24 NOTE — Patient Instructions (Addendum)
No more than 4 cups of fluids daily  Stop Vitamin D, no calcium

## 2020-09-24 NOTE — Progress Notes (Signed)
Patient ID: Pam [name], female   DOB: 12/08/1950, 70 y.o.   MRN: [id number]                  Chief complaint: Follow-up of low sodium and other questions  History of Present Illness:   Referring PCP: Drosinis, Wynona Dove, PA-C  HYPERCALCEMIA:   Review of records show that she has had a high calcium since 05/10/2018 when her calcium was 10.6. Previously highest calcium was 10.2 done in 2018 She had been referred here because of the finding of a PTH of 107 done in 05/2019  Calcium has been persistently high but now higher at 11.9 compared to 11.1 No recent complaints of nausea or drowsiness   Lab Results  Component Value Date   CALCIUM 11.9 (H) 09/21/2020   CALCIUM 11.1 (H) 03/16/2020   CALCIUM 11.0 (H) 01/14/2020   CALCIUM 11.0 (H) 10/02/2019   CALCIUM 10.8 (H) 08/19/2019   CALCIUM 9.6 09/29/2017   CALCIUM 9.5 08/21/2017   CALCIUM 9.9 03/21/2017   CALCIUM 9.9 03/03/2017    No history of low trauma fractures, renal insufficiency, nephrolithiasis, sarcoidosis, known carcinoma  OSTEOPOROSIS:  Screening bone density showed osteoporosis at the hip and radius as of 07/2019  Since then she has been on alendronate 70 mg weekly  She does follow instructions on taking this on empty stomach every week with a glass of water  She does not take calcium   Has taken vitamin D supplements using 1000 units of vitamin D3 as directed with improvement in levels   Lumbar spine L1-L3 (L4) Femoral neck (FN) 33% distal radius Ultra distal radius  T-score -1.0 RFN: -2.8 LFN: -2.4  -1.6  -2.7     25 (OH) Vitamin D level as follows:  Lab Results  Component Value Date   VD25OH 56.50 09/21/2020   VD25OH 31.81 10/02/2019   HYPONATREMIA: See review of systems   Allergies as of 09/24/2020      Reactions   Lisinopril    Dry cough   Spironolactone Other (See Comments)   Low sodium      Medication List       Accurate as of September 24, 2020  9:12 AM. If you have any questions,  ask your nurse or doctor.        acetaminophen 500 MG tablet Commonly known as: TYLENOL Take 1,000 mg by mouth every 6 (six) hours as needed (for pain.).   alendronate 70 MG tablet Commonly known as: FOSAMAX Take 1 tablet (70 mg total) by mouth once a week. Take with a full glass of water on an empty stomach.   atorvastatin 80 MG tablet Commonly known as: LIPITOR TAKE 1 TABLET(80 MG) BY MOUTH DAILY   carvedilol 6.25 MG tablet Commonly known as: COREG TAKE 1 TABLET(6.25 MG) BY MOUTH TWICE DAILY   cholecalciferol 25 MCG (1000 UNIT) tablet Commonly known as: VITAMIN D3 Take 1,000 Units by mouth daily.   clopidogrel 75 MG tablet Commonly known as: PLAVIX TAKE 1 TABLET(75 MG) BY MOUTH DAILY WITH BREAKFAST - Pt must keep appt in Jan for further refills   furosemide 20 MG tablet Commonly known as: LASIX TAKE 1 TABLET BY MOUTH EVERY MONDAY, [place] Friday. Please make yearly appt with Dr. Angelena Form for April 2022. Thanks 1st attempt   levothyroxine 25 MCG tablet Commonly known as: SYNTHROID Take 25 mcg by mouth daily before breakfast. Take 1.5 tablets (37.5mg  total) by mouth once daily.   losartan 100 MG tablet Commonly known  as: COZAAR TAKE 1 TABLET(100 MG TOTAL) BEFORE Y MOUTH DAILY.   nitroGLYCERIN 0.4 MG SL tablet Commonly known as: NITROSTAT Place 1 tablet (0.4 mg total) under the tongue every 5 (five) minutes x 3 doses as needed for chest pain.   ranolazine 1000 MG SR tablet Commonly known as: Ranexa Take 1 tablet (1,000 mg total) by mouth 2 (two) times daily.       Allergies:  Allergies  Allergen Reactions  . Lisinopril     Dry cough  . Spironolactone Other (See Comments)    Low sodium    Past Medical History:  Diagnosis Date  . Abnormal EKG   . Alcoholic intoxication without complication ([place])   . Allergy   . Cardiomyopathy, ischemic   . Cataract   . Chest pain 08/29/2016  . CHF (congestive heart failure) ([place])   . Chronic systolic heart  failure ([place]) 08/29/2016  . Coronary artery disease of native artery of native heart with stable angina pectoris ([place])   . Facial laceration   . Fall 07/07/2016  . Hyperlipidemia   . Hypertension 03/03/2017  . Hyponatremia   . Hypothyroidism 07/08/2016  . NSTEMI (non-ST elevated myocardial infarction) ([place])   . Syncope   . Thyroid disease   . Unstable angina ([place]) 08/29/2016    Past Surgical History:  Procedure Laterality Date  . CARDIAC CATHETERIZATION N/A 07/08/2016   Procedure: Left Heart Cath and Coronary Angiography;  Surgeon: [name], MD;  Location: [facility];  Service: Cardiovascular;  Laterality: N/A;  . CARDIAC CATHETERIZATION N/A 08/29/2016   Procedure: Coronary Atherectomy;  Surgeon: [name], MD;  Location: [facility];  Service: Cardiovascular;  Laterality: N/A;  . CARDIAC CATHETERIZATION N/A 08/29/2016   Procedure: Coronary Stent Intervention;  Surgeon: [name], MD;  Location: [facility];  Service: Cardiovascular;  Laterality: N/A;  . CATARACT EXTRACTION, BILATERAL    . ICD IMPLANT N/A 08/28/2019   Procedure: ICD IMPLANT;  Surgeon: Constance Haw, MD;  Location: [facility];  Service: Cardiovascular;  Laterality: N/A;  . VAGINAL HYSTERECTOMY      Family History  Problem Relation Age of Onset  . Kidney failure Mother   . Heart disease Mother        CHF  . Heart disease Sister   . Colon cancer Neg Hx   . Esophageal cancer Neg Hx   . Rectal cancer Neg Hx   . Stomach cancer Neg Hx     Social History:  reports that she quit smoking about 4 years ago. Her smoking use included cigarettes. She has a 50.00 pack-year smoking history. She has never used smokeless tobacco. She reports current alcohol use. She reports that she does not use drugs.  Review of Systems    She has mild hypothyroidism, apparently asymptomatic She has been prescribed levothyroxine 37.5 mcg daily by her PCP  Lab Results  Component Value Date    TSH 3.21 09/21/2020     HYPONATREMIA: She has had long-term hyponatremia of unclear etiology, not on thiazide diuretics except Lasix Also not on any other drugs causing hyponatremia Previously urine osmolality was relatively high at 332 and urine sodium last 39  Has not had any recent problems with nausea, weakness or change in appetite She does not think she has restricted her fluids much and is still drinking fluids throughout the day including water, coffee, juices and occasional diet drinks  Sodium is relatively lower at 128 compared to 131  Potassium is high but hemolyzed  Wt Readings from Last 3 Encounters:  09/24/20 124 lb (56.2 kg)  08/25/20 126 lb 3.2 oz (57.2 kg)  03/19/20 121 lb 3.2 oz (55 kg)   Lab Results  Component Value Date   NA 128 (L) 09/21/2020   K 5.3 slight hemolysis (H) 09/21/2020   CL 95 (L) 09/21/2020   CO2 29 09/21/2020     EXAM:  BP 130/70 (BP Location: Left Arm, Patient Position: Sitting, Cuff Size: Normal)   Pulse 64   Ht 5\' 3"  (1.6 m)   Wt 124 lb (56.2 kg)   SpO2 99%   BMI 21.97 kg/m     Assessment/Plan:   HYPERCALCEMIA:  She has had persistent hypercalcemia secondary to primary hyperparathyroidism since at least 2019 with the highest level 11.1 However now her level is up to 11.9 and not clear the reason for the increase  Discussed possibility of needing parathyroid surgery because of her concomitant osteoporosis This may be relatively difficult because of her history of cardiomyopathy We will recheck in about a month In the meantime she will try to leave off her vitamin D  Osteoporosis: Treated with alendronate and will continue for now  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   Elayne Snare 09/24/2020, 9:12 AM

## 2020-09-26 ENCOUNTER — Other Ambulatory Visit: Payer: Self-pay | Admitting: Cardiovascular Disease

## 2020-09-26 DIAGNOSIS — I25119 Atherosclerotic heart disease of native coronary artery with unspecified angina pectoris: Secondary | ICD-10-CM

## 2020-09-26 DIAGNOSIS — I5022 Chronic systolic (congestive) heart failure: Secondary | ICD-10-CM

## 2020-09-28 ENCOUNTER — Other Ambulatory Visit: Payer: Self-pay | Admitting: Endocrinology

## 2020-09-28 ENCOUNTER — Other Ambulatory Visit: Payer: Self-pay

## 2020-09-28 DIAGNOSIS — I5022 Chronic systolic (congestive) heart failure: Secondary | ICD-10-CM

## 2020-09-28 DIAGNOSIS — I25119 Atherosclerotic heart disease of native coronary artery with unspecified angina pectoris: Secondary | ICD-10-CM

## 2020-09-28 MED ORDER — CARVEDILOL 6.25 MG PO TABS: ORAL_TABLET | ORAL | 0 refills | Status: DC

## 2020-10-13 ENCOUNTER — Other Ambulatory Visit (INDEPENDENT_AMBULATORY_CARE_PROVIDER_SITE_OTHER): Payer: Federal, State, Local not specified - PPO

## 2020-10-13 ENCOUNTER — Other Ambulatory Visit: Payer: Self-pay

## 2020-10-13 DIAGNOSIS — E871 Hypo-osmolality and hyponatremia: Secondary | ICD-10-CM | POA: Diagnosis not present

## 2020-10-13 LAB — BASIC METABOLIC PANEL
BUN: 12 mg/dL (ref 6–23)
CO2: 29 mEq/L (ref 19–32)
Calcium: 11.7 mg/dL — ABNORMAL HIGH (ref 8.4–10.5)
Chloride: 99 mEq/L (ref 96–112)
Creatinine, Ser: 0.94 mg/dL (ref 0.40–1.20)
GFR: 62.03 mL/min (ref >60.00)
Glucose, Bld: 90 mg/dL (ref 70–99)
Potassium: 5.1 mEq/L (ref 3.5–5.1)
Sodium: 132 mEq/L — ABNORMAL LOW (ref 135–145)

## 2020-10-15 NOTE — Progress Notes (Addendum)
Patient ID: Pam [name], female   DOB: [birth year], 70 y.o.   MRN: [id number]                  Chief complaint: Follow-up of low sodium and other questions  History of Present Illness:   Referring PCP: Drosinis, Wynona Dove, PA-C  HYPERCALCEMIA:   Review of records show that she has had a high calcium since 05/10/2018 when her calcium was 10.6. Previously highest calcium was 10.2 done in 2018 She had been referred here because of the finding of a PTH of 107 done in 05/2019  Calcium has been persistently high but now consistently above 11.5 the last 2 measurements She was told to leave off her vitamin D supplement last month   Lab Results  Component Value Date   CALCIUM 11.7 (H) 10/13/2020   CALCIUM 11.9 (H) 09/21/2020   CALCIUM 11.1 (H) 03/16/2020   CALCIUM 11.0 (H) 01/14/2020   CALCIUM 11.0 (H) 10/02/2019   CALCIUM 10.8 (H) 08/19/2019   CALCIUM 9.6 09/29/2017   CALCIUM 9.5 08/21/2017   CALCIUM 9.9 03/21/2017    No history of low trauma fractures, renal insufficiency, nephrolithiasis, sarcoidosis, known carcinoma  OSTEOPOROSIS:  Screening bone density showed osteoporosis at the hip and radius as of 07/2019  Since then she has been on alendronate 70 mg weekly  She does follow instructions on taking this on empty stomach every week with a glass of water  She does not take calcium   Has taken vitamin D supplements using 1000 units of vitamin D3, last level was normal   Lumbar spine L1-L3 (L4) Femoral neck (FN) 33% distal radius Ultra distal radius  T-score -1.0 RFN: -2.8 LFN: -2.4  -1.6  -2.7     25 (OH) Vitamin D level as follows:  Lab Results  Component Value Date   VD25OH 56.50 09/21/2020   VD25OH 31.81 10/02/2019   HYPONATREMIA: See review of systems   Allergies as of 10/16/2020      Reactions   Lisinopril    Dry cough   Spironolactone Other (See Comments)   Low sodium      Medication List       Accurate as of October 16, 2020 10:07 AM. If you  have any questions, ask your nurse or doctor.        acetaminophen 500 MG tablet Commonly known as: TYLENOL Take 1,000 mg by mouth every 6 (six) hours as needed (for pain.).   alendronate 70 MG tablet Commonly known as: FOSAMAX TAKE 1 TABLET(70 MG) BY MOUTH 1 TIME A WEEK WITH A FULL GLASS OF WATER AND ON AN EMPTY STOMACH   atorvastatin 80 MG tablet Commonly known as: LIPITOR TAKE 1 TABLET(80 MG) BY MOUTH DAILY   carvedilol 6.25 MG tablet Commonly known as: COREG TAKE 1 TABLET(6.25 MG) BY MOUTH TWICE DAILY. Please keep upcoming with Dr. Angelena Form in May 2022 before anymore refills. Thank you   cholecalciferol 25 MCG (1000 UNIT) tablet Commonly known as: VITAMIN D3 Take 1,000 Units by mouth daily.   clopidogrel 75 MG tablet Commonly known as: PLAVIX TAKE 1 TABLET(75 MG) BY MOUTH DAILY WITH BREAKFAST   furosemide 20 MG tablet Commonly known as: LASIX TAKE 1 TABLET BY MOUTH EVERY MONDAY, [facility] AND Friday. Please make yearly appt with Dr. Angelena Form for April 2022. Thanks 1st attempt   levothyroxine 25 MCG tablet Commonly known as: SYNTHROID Take 25 mcg by mouth daily before breakfast. Take 1.5 tablets (37.5mg  total) by mouth once daily.  losartan 100 MG tablet Commonly known as: COZAAR TAKE 1 TABLET(100 MG TOTAL) BEFORE Y MOUTH DAILY.   nitroGLYCERIN 0.4 MG SL tablet Commonly known as: NITROSTAT Place 1 tablet (0.4 mg total) under the tongue every 5 (five) minutes x 3 doses as needed for chest pain.   ranolazine 1000 MG SR tablet Commonly known as: Ranexa Take 1 tablet (1,000 mg total) by mouth 2 (two) times daily.       Allergies:  Allergies  Allergen Reactions  . Lisinopril     Dry cough  . Spironolactone Other (See Comments)    Low sodium    Past Medical History:  Diagnosis Date  . Abnormal EKG   . Alcoholic intoxication without complication ([place])   . Allergy   . Cardiomyopathy, ischemic   . Cataract   . Chest pain 08/29/2016  . CHF (congestive  heart failure) ([place])   . Chronic systolic heart failure ([place]) 08/29/2016  . Coronary artery disease of native artery of native heart with stable angina pectoris ([place])   . Facial laceration   . Fall 07/07/2016  . Hyperlipidemia   . Hypertension 03/03/2017  . Hyponatremia   . Hypothyroidism 07/08/2016  . NSTEMI (non-ST elevated myocardial infarction) ([place])   . Syncope   . Thyroid disease   . Unstable angina ([place]) 08/29/2016    Past Surgical History:  Procedure Laterality Date  . CARDIAC CATHETERIZATION N/A 07/08/2016   Procedure: Left Heart Cath and Coronary Angiography;  Surgeon: [name], MD;  Location: [facility];  Service: Cardiovascular;  Laterality: N/A;  . CARDIAC CATHETERIZATION N/A 08/29/2016   Procedure: Coronary Atherectomy;  Surgeon: [name], MD;  Location: [facility];  Service: Cardiovascular;  Laterality: N/A;  . CARDIAC CATHETERIZATION N/A 08/29/2016   Procedure: Coronary Stent Intervention;  Surgeon: [name], MD;  Location: [facility];  Service: Cardiovascular;  Laterality: N/A;  . CATARACT EXTRACTION, BILATERAL    . ICD IMPLANT N/A 08/28/2019   Procedure: ICD IMPLANT;  Surgeon: Constance Haw, MD;  Location: [facility];  Service: Cardiovascular;  Laterality: N/A;  . VAGINAL HYSTERECTOMY      Family History  Problem Relation Age of Onset  . Kidney failure Mother   . Heart disease Mother        CHF  . Heart disease Sister   . Colon cancer Neg Hx   . Esophageal cancer Neg Hx   . Rectal cancer Neg Hx   . Stomach cancer Neg Hx     Social History:  reports that she quit smoking about 4 years ago. Her smoking use included cigarettes. She has a 50.00 pack-year smoking history. She has never used smokeless tobacco. She reports current alcohol use. She reports that she does not use drugs.  Review of Systems  She has mild hypothyroidism, apparently asymptomatic She has been prescribed levothyroxine 37.5 mcg daily by  her PCP  Lab Results  Component Value Date   TSH 3.21 09/21/2020     HYPONATREMIA: She has had long-term hyponatremia of unclear etiology, not on thiazide diuretics except Lasix Also not on any other drugs causing hyponatremia Previously urine osmolality was relatively high at 332 and urine sodium last 39   In 2/22 because of her sodium going down to 128 she was told to start restricting her fluids and cut back drinking fluids throughout the day including water, coffee, juices and occasional diet drinks Has not had any recent problems with nausea, weakness or change  in appetite Sodium is improving at 132  She has had upper normal potassium levels at times  Wt Readings from Last 3 Encounters:  10/16/20 123 lb 3.2 oz (55.9 kg)  09/24/20 124 lb (56.2 kg)  08/25/20 126 lb 3.2 oz (57.2 kg)   Lab Results  Component Value Date   NA 132 (L) 10/13/2020   K 5.1 10/13/2020   CL 99 10/13/2020   CO2 29 10/13/2020     EXAM:  BP 130/72 (BP Location: Left Arm, Patient Position: Sitting, Cuff Size: Normal)   Pulse 62   Resp 20   Ht 5\' 4"  (1.626 m)   Wt 123 lb 3.2 oz (55.9 kg)   SpO2 99%   BMI 21.15 kg/m     Assessment/Plan:   HYPERCALCEMIA:  She has had persistent hypercalcemia secondary to primary hyperparathyroidism since at least 2019 with the highest level 11.1 However now her level is consistently higher and above 11.5 twice  Considering her age and rising calcium she likely needs parathyroid surgery because of her concomitant osteoporosis Because of her cardiac history would prefer that she have minimally invasive surgery For this will need to do her parathyroid scan to see if she has a single adenoma  Nuclear scan will be ordered   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   Elayne Snare 10/16/2020, 10:07 AM   11/26/20:  Sestamibi scan was not definitive but CT scan shows right upper parathyroid lesion.  Will refer for surgery

## 2020-10-16 ENCOUNTER — Other Ambulatory Visit: Payer: Self-pay | Admitting: Cardiology

## 2020-10-16 ENCOUNTER — Encounter: Payer: Self-pay | Admitting: Endocrinology

## 2020-10-16 ENCOUNTER — Other Ambulatory Visit: Payer: Self-pay

## 2020-10-16 ENCOUNTER — Ambulatory Visit: Payer: Federal, State, Local not specified - PPO | Admitting: Endocrinology

## 2020-10-16 VITALS — BP 130/72 | HR 62 | Resp 20 | Ht 64.0 in | Wt 123.2 lb

## 2020-10-16 DIAGNOSIS — E213 Hyperparathyroidism, unspecified: Secondary | ICD-10-CM | POA: Diagnosis not present

## 2020-10-16 DIAGNOSIS — E871 Hypo-osmolality and hyponatremia: Secondary | ICD-10-CM | POA: Diagnosis not present

## 2020-10-16 DIAGNOSIS — E039 Hypothyroidism, unspecified: Secondary | ICD-10-CM | POA: Diagnosis not present

## 2020-10-23 ENCOUNTER — Other Ambulatory Visit: Payer: Self-pay

## 2020-10-23 ENCOUNTER — Encounter (HOSPITAL_COMMUNITY)
Admission: RE | Admit: 2020-10-23 | Discharge: 2020-10-23 | Disposition: A | Discharge status: Home / Self Care | Payer: Federal, State, Local not specified - PPO | Source: Ambulatory Visit | Attending: Endocrinology | Admitting: Endocrinology

## 2020-10-23 DIAGNOSIS — E213 Hyperparathyroidism, unspecified: Secondary | ICD-10-CM

## 2020-10-23 MED ORDER — TECHNETIUM TC 99M SESTAMIBI GENERIC - CARDIOLITE
26.5000 | Freq: Once | INTRAVENOUS | Status: AC | PRN
  Administered 2020-10-23: 26.5 via INTRAVENOUS

## 2020-10-28 ENCOUNTER — Telehealth: Payer: Self-pay | Admitting: Endocrinology

## 2020-10-28 NOTE — Telephone Encounter (Signed)
Pt requests a call regarding her appt on Friday 3/25 for her NM parathyroid scan. Pt requests a call regarding those results and what her next steps are at ph # [phone]

## 2020-10-30 NOTE — Telephone Encounter (Signed)
Notifies pt husband regarding parathyroid  gland results --is enlarge per Dr. Charlett Lango and will contact the pt back as soon Dr. Dwyane Dee come back in the office for the next step.

## 2020-10-30 NOTE — Telephone Encounter (Signed)
The scan shows that one of her parathyroid gland is enlarged , this is why her calcium is high. Dr. Dwyane Dee ordered because he was considering surgery . She will have to wait on him for a definitive plan as he is her endocrinologist . Let her know he is out of [place] and will take care of it upon his return

## 2020-10-30 NOTE — Telephone Encounter (Signed)
Please advise 

## 2020-10-31 ENCOUNTER — Other Ambulatory Visit: Payer: Self-pay | Admitting: Cardiovascular Disease

## 2020-11-10 ENCOUNTER — Telehealth: Payer: Self-pay

## 2020-11-10 NOTE — Progress Notes (Signed)
Please let her know that the nuclear scan for parathyroid suggests overactive gland on the right side but cannot be definite.  Recommend doing a CT scan to confirm this before sending to surgeon.  Will order this if agreeable with her

## 2020-11-10 NOTE — Telephone Encounter (Addendum)
-----   Message from Elayne Snare, MD sent at 11/10/2020  2:38 PM EDT ----- Please let her know that the nuclear scan for parathyroid suggests overactive gland on the right side but cannot be definite.  Recommend doing a CT scan to confirm this before sending to surgeon.  Will order this if agreeable with her  Pt is agreeable to having a CT scan. Will await further information regarding date and time.

## 2020-11-11 ENCOUNTER — Other Ambulatory Visit: Payer: Self-pay | Admitting: Endocrinology

## 2020-11-11 DIAGNOSIS — E213 Hyperparathyroidism, unspecified: Secondary | ICD-10-CM

## 2020-11-11 NOTE — Progress Notes (Signed)
CT scan ordered

## 2020-11-18 ENCOUNTER — Other Ambulatory Visit (INDEPENDENT_AMBULATORY_CARE_PROVIDER_SITE_OTHER): Payer: Federal, State, Local not specified - PPO

## 2020-11-18 ENCOUNTER — Other Ambulatory Visit: Payer: Self-pay

## 2020-11-18 DIAGNOSIS — E039 Hypothyroidism, unspecified: Secondary | ICD-10-CM | POA: Diagnosis not present

## 2020-11-18 DIAGNOSIS — E213 Hyperparathyroidism, unspecified: Secondary | ICD-10-CM

## 2020-11-18 LAB — BASIC METABOLIC PANEL
BUN: 14 mg/dL (ref 6–23)
CO2: 28 mEq/L (ref 19–32)
Calcium: 12.2 mg/dL — ABNORMAL HIGH (ref 8.4–10.5)
Chloride: 97 mEq/L (ref 96–112)
Creatinine, Ser: 0.84 mg/dL (ref 0.40–1.20)
GFR: 70.94 mL/min (ref >60.00)
Glucose, Bld: 90 mg/dL (ref 70–99)
Potassium: 5 mEq/L (ref 3.5–5.1)
Sodium: 130 mEq/L — ABNORMAL LOW (ref 135–145)

## 2020-11-18 LAB — TSH: TSH: 2.97 u[IU]/mL (ref 0.35–4.50)

## 2020-11-19 ENCOUNTER — Ambulatory Visit (HOSPITAL_COMMUNITY): Payer: Federal, State, Local not specified - PPO

## 2020-11-20 ENCOUNTER — Other Ambulatory Visit: Payer: Self-pay | Admitting: Cardiovascular Disease

## 2020-11-20 DIAGNOSIS — I5022 Chronic systolic (congestive) heart failure: Secondary | ICD-10-CM

## 2020-11-25 ENCOUNTER — Ambulatory Visit (HOSPITAL_COMMUNITY)
Admission: RE | Admit: 2020-11-25 | Discharge: 2020-11-25 | Disposition: A | Discharge status: Home / Self Care | Payer: Federal, State, Local not specified - PPO | Source: Ambulatory Visit | Attending: Endocrinology | Admitting: Endocrinology

## 2020-11-25 ENCOUNTER — Other Ambulatory Visit: Payer: Self-pay

## 2020-11-25 ENCOUNTER — Encounter (HOSPITAL_COMMUNITY): Payer: Self-pay

## 2020-11-25 DIAGNOSIS — E213 Hyperparathyroidism, unspecified: Secondary | ICD-10-CM | POA: Insufficient documentation

## 2020-11-25 MED ORDER — IOHEXOL 300 MG/ML  SOLN
75.0000 mL | Freq: Once | INTRAMUSCULAR | Status: AC | PRN
  Administered 2020-11-25: 75 mL via INTRAVENOUS

## 2020-11-26 ENCOUNTER — Ambulatory Visit (INDEPENDENT_AMBULATORY_CARE_PROVIDER_SITE_OTHER): Payer: Federal, State, Local not specified - PPO

## 2020-11-26 DIAGNOSIS — I255 Ischemic cardiomyopathy: Secondary | ICD-10-CM | POA: Diagnosis not present

## 2020-11-26 LAB — CUP PACEART REMOTE DEVICE CHECK
Battery Remaining Longevity: 131 mo
Battery Voltage: 3.03 V
Brady Statistic RV Percent Paced: 0.02 %
Date Time Interrogation Session: 20220428012505
HighPow Impedance: 66 Ohm
Implantable Lead Implant Date: 20210127
Implantable Lead Location: 753860
Implantable Pulse Generator Implant Date: 20210127
Lead Channel Impedance Value: 304 Ohm
Lead Channel Impedance Value: 399 Ohm
Lead Channel Pacing Threshold Amplitude: 0.75 V
Lead Channel Pacing Threshold Pulse Width: 0.4 ms
Lead Channel Sensing Intrinsic Amplitude: 9.125 mV
Lead Channel Sensing Intrinsic Amplitude: 9.125 mV
Lead Channel Setting Pacing Amplitude: 2 V
Lead Channel Setting Pacing Pulse Width: 0.4 ms
Lead Channel Setting Sensing Sensitivity: 0.3 mV

## 2020-11-26 NOTE — Progress Notes (Signed)
She does have a parathyroid enlarged behind the right upper pole of the thyroid and will refer her to Dr. Harlow Asa for surgery

## 2020-11-26 NOTE — Addendum Note (Signed)
Addended by: Elayne Snare on: 11/26/2020 12:16 PM   Modules accepted: Orders

## 2020-12-17 NOTE — Progress Notes (Signed)
Remote ICD transmission.   

## 2020-12-21 ENCOUNTER — Ambulatory Visit: Payer: Federal, State, Local not specified - PPO | Admitting: Cardiovascular Disease

## 2020-12-21 ENCOUNTER — Encounter: Payer: Self-pay | Admitting: Cardiovascular Disease

## 2020-12-21 ENCOUNTER — Other Ambulatory Visit: Payer: Self-pay

## 2020-12-21 VITALS — BP 140/62 | HR 62 | Ht 64.0 in | Wt 121.0 lb

## 2020-12-21 DIAGNOSIS — E785 Hyperlipidemia, unspecified: Secondary | ICD-10-CM | POA: Diagnosis not present

## 2020-12-21 DIAGNOSIS — I255 Ischemic cardiomyopathy: Secondary | ICD-10-CM | POA: Diagnosis not present

## 2020-12-21 DIAGNOSIS — I5022 Chronic systolic (congestive) heart failure: Secondary | ICD-10-CM | POA: Diagnosis not present

## 2020-12-21 DIAGNOSIS — I25119 Atherosclerotic heart disease of native coronary artery with unspecified angina pectoris: Secondary | ICD-10-CM | POA: Diagnosis not present

## 2020-12-21 MED ORDER — RANOLAZINE ER 1000 MG PO TB12: 1000.0000 mg | ORAL_TABLET | Freq: Two times a day (BID) | ORAL | 3 refills | Status: DC

## 2020-12-21 MED ORDER — CLOPIDOGREL BISULFATE 75 MG PO TABS: ORAL_TABLET | ORAL | 3 refills | Status: DC

## 2020-12-21 MED ORDER — FUROSEMIDE 20 MG PO TABS: ORAL_TABLET | ORAL | 3 refills | Status: DC

## 2020-12-21 MED ORDER — CARVEDILOL 6.25 MG PO TABS: ORAL_TABLET | ORAL | 3 refills | Status: DC

## 2020-12-21 NOTE — Progress Notes (Signed)
Chief Complaint  Patient presents with  . Follow-up    CAD    History of Present Illness: 70 yo female with history of CAD, ischemic cardiomyopathy, chronic systolic CHF, HTN, HLD and hypothyroidism who is here today for cardiac follow up. She had been followed by Dr. Saunders Revel. I met her in November 2019. She was admitted to [facility] in December 2017 with a NSTEMI. She was found to have a chronic occlusion of the mid LAD and severe, calcified stenosis in the proximal Circumflex.LVEF was 30% by LV gram and 35-40% by echo. Cardiac MRI December 2017 with non-viability of the anterior wall in the LAD territory. LVEF=38% by cardiac MRI. The Circumflex stenosiswas treated with orbital atherectomy and stenting using a drug eluting stent in a staged procedure in January 2018. Echo November 2020 with LVEF=25-30%. Dilated LV. Mild MR. ICD placed in January 2021.   She is here today for follow up. The patient denies any chest pain, dyspnea, palpitations, lower extremity edema, orthopnea, PND, dizziness, near syncope or syncope. She has some fatigue.    Primary Care Physician: Loraine Leriche., MD  Past Medical History:  Diagnosis Date  . Abnormal EKG   . Alcoholic intoxication without complication ([place])   . Allergy   . Cardiomyopathy, ischemic   . Cataract   . Chest pain 08/29/2016  . CHF (congestive heart failure) ([place])   . Chronic systolic heart failure ([place]) 08/29/2016  . Coronary artery disease of native artery of native heart with stable angina pectoris ([place])   . Facial laceration   . Fall 07/07/2016  . Hyperlipidemia   . Hypertension 03/03/2017  . Hyponatremia   . Hypothyroidism 07/08/2016  . NSTEMI (non-ST elevated myocardial infarction) ([place])   . Syncope   . Thyroid disease   . Unstable angina ([place]) 08/29/2016    Past Surgical History:  Procedure Laterality Date  . CARDIAC CATHETERIZATION N/A 07/08/2016   Procedure: Left Heart Cath and Coronary Angiography;  Surgeon: [name],  MD;  Location: [place] [facility];  Service: Cardiovascular;  Laterality: N/A;  . CARDIAC CATHETERIZATION N/A 08/29/2016   Procedure: Coronary Atherectomy;  Surgeon: [name], MD;  Location: [place] [facility];  Service: Cardiovascular;  Laterality: N/A;  . CARDIAC CATHETERIZATION N/A 08/29/2016   Procedure: Coronary Stent Intervention;  Surgeon: [name], MD;  Location: [place] [facility];  Service: Cardiovascular;  Laterality: N/A;  . CATARACT EXTRACTION, BILATERAL    . ICD IMPLANT N/A 08/28/2019   Procedure: ICD IMPLANT;  Surgeon: Constance Haw, MD;  Location: [place] [facility];  Service: Cardiovascular;  Laterality: N/A;  . VAGINAL HYSTERECTOMY      Current Outpatient Medications  Medication Sig Dispense Refill  . acetaminophen (TYLENOL) 500 MG tablet Take 1,000 mg by mouth every 6 (six) hours as needed (for pain.).    Marland Kitchen alendronate (FOSAMAX) 70 MG tablet TAKE 1 TABLET(70 MG) BY MOUTH 1 TIME A WEEK WITH A FULL GLASS OF WATER AND ON AN EMPTY STOMACH 12 tablet 3  . atorvastatin (LIPITOR) 80 MG tablet TAKE 1 TABLET(80 MG) BY MOUTH DAILY 90 tablet 1  . levothyroxine (SYNTHROID) 25 MCG tablet Take 25 mcg by mouth daily before breakfast. Take 1.5 tablets (37.$RemoveBeforeD'5mg'XPgiCsnUiuCCrn$  total) by mouth once daily.    Marland Kitchen losartan (COZAAR) 100 MG tablet TAKE 1 TABLET(100 MG TOTAL) BEFORE Y MOUTH DAILY. 90 tablet 2  . nitroGLYCERIN (NITROSTAT) 0.4 MG SL tablet Place 1 tablet (0.4 mg total) under the tongue every 5 (five)  minutes x 3 doses as needed for chest pain. 75 tablet 1  . carvedilol (COREG) 6.25 MG tablet TAKE 1 TABLET(6.25 MG) BY MOUTH TWICE DAILY. 180 tablet 3  . cholecalciferol (VITAMIN D3) 25 MCG (1000 UNIT) tablet Take 1,000 Units by mouth daily. (Patient not taking: Reported on 12/21/2020)    . clopidogrel (PLAVIX) 75 MG tablet TAKE 1 TABLET(75 MG) BY MOUTH DAILY WITH BREAKFAST 90 tablet 3  . furosemide (LASIX) 20 MG tablet TAKE 1 TABLET BY MOUTH EVERY MONDAY, WEDNESDAY, AND FRIDAY. 45 tablet 3   . ranolazine (RANEXA) 1000 MG SR tablet Take 1 tablet (1,000 mg total) by mouth 2 (two) times daily. 180 tablet 3   No current facility-administered medications for this visit.    Allergies  Allergen Reactions  . Lisinopril     Dry cough  . Spironolactone Other (See Comments)    Low sodium    Social History   Socioeconomic History  . Marital status: Married    Spouse name: Not on file  . Number of children: 2  . Years of education: Not on file  . Highest education level: Not on file  Occupational History  . Occupation: Retired-worked for post [employer]  Tobacco Use  . Smoking status: Former Smoker    Packs/day: 1.00    Years: 50.00    Pack years: 50.00    Types: Cigarettes    Quit date: 07/07/2016    Years since quitting: 4.4  . Smokeless tobacco: Never Used  Vaping Use  . Vaping Use: Never used  Substance and Sexual Activity  . Alcohol use: Yes    Comment: rarely-mixed drink once in a blue moon  . Drug use: No  . Sexual activity: Not on file  Other Topics Concern  . Not on file  Social History Narrative  . Not on file   Social Determinants of Health   Financial Resource Strain: Not on file  Food Insecurity: Not on file  Transportation Needs: Not on file  Physical Activity: Not on file  Stress: Not on file  Social Connections: Not on file  Intimate Partner Violence: Not on file    Family History  Problem Relation Age of Onset  . Kidney failure Mother   . Heart disease Mother        CHF  . Heart disease Sister   . Colon cancer Neg Hx   . Esophageal cancer Neg Hx   . Rectal cancer Neg Hx   . Stomach cancer Neg Hx     Review of Systems:  As stated in the HPI and otherwise negative.   BP 140/62   Pulse 62   Ht $R'5\' 4"'vp$  (1.626 m)   Wt 121 lb (54.9 kg)   SpO2 98%   BMI 20.77 kg/m   Physical Examination: General: Well developed, well nourished, NAD  HEENT: OP clear, mucus membranes moist  SKIN: warm, dry. No rashes. Neuro: No focal deficits   Musculoskeletal: Muscle strength 5/5 all ext  Psychiatric: Mood and affect normal  Neck: No JVD, no carotid bruits, no thyromegaly, no lymphadenopathy.  Lungs:Clear bilaterally, no wheezes, rhonci, crackles Cardiovascular: Regular rate and rhythm. No murmurs, gallops or rubs. Abdomen:Soft. Bowel sounds present. Non-tender.  Extremities: No lower extremity edema. Pulses are 2 + in the bilateral DP/PT.  EKG:  EKG is not ordered today. The ekg ordered today demonstrates   Echo November 2020: 1. Left ventricular ejection fraction, by visual estimation, is 25 to  30%. The left ventricle has severely  decreased function. Left ventricular  septal wall thickness was normal. There is no left ventricular  hypertrophy.  2. Severely dilated left ventricular internal cavity size.  3. Mid and apical segments severely hypokinetic Preserved basal funciton.  4. Global right ventricle has normal systolic function.The right  ventricular size is normal. No increase in right ventricular wall  thickness.  5. Left atrial size was mildly dilated.  6. Right atrial size was normal.  7. The mitral valve is normal in structure. Mild mitral valve  regurgitation. No evidence of mitral stenosis.  8. The tricuspid valve is normal in structure. Tricuspid valve  regurgitation is not demonstrated.  9. The aortic valve is normal in structure. Aortic valve regurgitation is  not visualized. No evidence of aortic valve sclerosis or stenosis.  10. The pulmonic valve was normal in structure. Pulmonic valve  regurgitation is not visualized.  11. Mildly elevated pulmonary artery systolic pressure.  12. The inferior vena cava is normal in size with greater than 50%  respiratory variability, suggesting right atrial pressure of 3 mmHg.   Recent Labs: 09/21/2020: ALT 17 11/18/2020: BUN 14; Creatinine, Ser 0.84; Potassium 5.0; Sodium 130; TSH 2.97   Lipid Panel    Component Value Date/Time   CHOL 153 03/01/2018  0801   TRIG 41 03/01/2018 0801   HDL 93 03/01/2018 0801   CHOLHDL 1.6 03/01/2018 0801   CHOLHDL 2.0 07/07/2016 1914   VLDL 17 07/07/2016 1914   LDLCALC 52 03/01/2018 0801     Wt Readings from Last 3 Encounters:  12/21/20 121 lb (54.9 kg)  10/16/20 123 lb 3.2 oz (55.9 kg)  09/24/20 124 lb (56.2 kg)     Other studies Reviewed: Additional studies/ records that were reviewed today include: . Review of the above records demonstrates:    Assessment and Plan:   1.CAD with stable angina: Rare chest pains. She has used NTG several times over the past year. Continue Plavix, beta blocker, Ranexa and statin.       2. Chronic systolic CHF/Ischemic cardiomyopathy: No volume overload. ICD in place. Will continue Coreg, Losartan and Lasix. Delene Loll has not been started due to soft BP in the past.   3. Hyperlipidemia: Lipids followed in primary care. Good HDL/LDL ratio with LDL 74. See Care Everywhere. Continue statin.   4. Tobacco abuse, in remission: She stopped smoking in 2017.  Current medicines are reviewed at length with the patient today.  The patient does not have concerns regarding medicines.  The following changes have been made:  no change  Labs/ tests ordered today include:   No orders of the defined types were placed in this encounter.  Disposition:   F/U with me in 12 months  Signed, Lauree Chandler, MD 12/21/2020 9:33 AM    [facility] [address], [place], [place]  [address] Phone: [phone]; Fax: [phone]

## 2020-12-21 NOTE — Patient Instructions (Signed)
Medication Instructions:  No changes *If you need a refill on your cardiac medications before your next appointment, please call your pharmacy*   Lab Work: none If you have labs (blood work) drawn today and your tests are completely normal, you will receive your results only by: . MyChart Message (if you have MyChart) OR . A paper copy in the mail If you have any lab test that is abnormal or we need to change your treatment, we will call you to review the results.   Testing/Procedures: none   Follow-Up: At [facility], you and your health needs are our priority.  As part of our continuing mission to provide you with exceptional heart care, we have created designated Provider Care Teams.  These Care Teams include your primary Cardiologist (physician) and Advanced Practice Providers (APPs -  Physician Assistants and Nurse Practitioners) who all work together to provide you with the care you need, when you need it.  Your next appointment:   12 month(s)  The format for your next appointment:   In Person  Provider:   You may see Christopher McAlhany, MD or one of the following Advanced Practice Providers on your designated Care Team:    Dayna Dunn, PA-C  Michele Lenze, PA-C  

## 2020-12-22 ENCOUNTER — Ambulatory Visit: Payer: Self-pay | Admitting: Surgery

## 2020-12-22 ENCOUNTER — Telehealth: Payer: Self-pay | Admitting: *Deleted

## 2020-12-22 NOTE — Telephone Encounter (Signed)
  [facility] Pre-operative Risk Assessment    Patient Name: Pam [name]  DOB: 30-Nov-2050 MRN: [id number]  HEARTCARE STAFF: - Please ensure there is not already an duplicate clearance open for this procedure. - Under Visit Info/Reason for Call, type in Other and utilize the format Clearance MM/DD/YY or Clearance TBD. Do not use dashes or single digits. - If request is for dental extraction, please clarify the # of teeth to be extracted.  Request for surgical clearance:  1. What type of surgery is being performed? PARATHYROIDECTOMY    2. When is this surgery scheduled? TBD   3. What type of clearance is required (medical clearance vs. Pharmacy clearance to hold med vs. Both)? MEDICAL  4. Are there any medications that need to be held prior to surgery and how long? PLAVIX    5. Practice name and name of physician performing surgery? [facility]; DR. Sherren Mocha GERKIN   6. What is the office phone number? [phone]   7.   What is the office fax number? [place]: Mammie Lorenzo, LPN  8.   Anesthesia type (None, local, MAC, general) ? GENERAL   Julaine Hua 12/22/2020, 4:15 PM  _________________________________________________________________   (provider comments below)

## 2020-12-22 NOTE — Telephone Encounter (Signed)
Pt was just seen in office yesterday.  Dr. Angelena Form -  Based upon your assessment yesterday, is the patient able to proceed with surgery as noted and hold Plavix for 5 days prior? Please route to P CV DIV PREOP  Richardson Dopp, PA-C    12/22/2020 4:36 PM

## 2020-12-23 NOTE — Telephone Encounter (Signed)
She is good for surgery based on our visit. OK to hold Plavix. Pam [name]

## 2020-12-23 NOTE — Telephone Encounter (Signed)
Notes faxed to surgeon. This phone note will be removed from the preop pool. Richardson Dopp, PA-C  12/23/2020 8:34 AM

## 2020-12-23 NOTE — Telephone Encounter (Signed)
   Name: Pam [name] DOB: 04/27/1951  MRN: [id number]  Primary Cardiologist: Lauree Chandler, MD  Chart reviewed as part of pre-operative protocol coverage.   Please see recent OV note from Dr. Angelena Form (Faxed separately).    Recommendations: . Based on ACC/AHA guidelines, the patient is at acceptable risk for the planned procedure and may proceed without further cardiovascular testing.  Marland Kitchen Clopidogrel (Plavix) can be held for 5 days prior to surgery and should be resumed as soon as possible postop when felt to be safe.   Please call with questions. Richardson Dopp, PA-C 12/23/2020, 8:29 AM

## 2020-12-25 NOTE — Telephone Encounter (Signed)
Pam [name] from [facility] states they received the clearance from Dr. Angelena Form, but they also need clearance from Dr. Curt Bears in regards to her defibrillator. She states she faxed both, but will fax the one for Dr. Curt Bears again.

## 2020-12-25 NOTE — Telephone Encounter (Signed)
Claiborne Billings at [facility] provided with device clinic fax # [phone]) to fax prop device clearance form.

## 2020-12-29 NOTE — Telephone Encounter (Signed)
S/w Pam Schlatter LPN at [facility] in regards to device clearance. Pam [name], states they have not yet received device clearance. I assured Pam [name], as FYI that I will send a message to device clinic, clearance not yet received. Pam [name], thanked me for the help.

## 2021-01-01 ENCOUNTER — Telehealth: Payer: Self-pay | Admitting: Cardiovascular Disease

## 2021-01-01 NOTE — Telephone Encounter (Signed)
Follow up:    Patient calling to check the status of clearance.

## 2021-01-01 NOTE — Telephone Encounter (Signed)
Device clearance routed via Right fax to ATTN: Mammie Lorenzo LPN [phone]

## 2021-01-01 NOTE — Telephone Encounter (Addendum)
[place] DEVICE PROGRAMMING   Patient Information: Name Pam [name]   DOB: 26-Jun-1951  MRN: [id number]    Planned Procedure:  Parathyroidectomy Surgeon:  [facility]- Dr. Earnstine Regal Date of Procedure:  TBD    Device Information:   Clinic EP Physician:   Allegra Lai, MD Device Type:  Defibrillator Manufacturer and Phone #:  Medtronic: [phone] Pacemaker Dependent?:  No Date of Last Device Check:  11/26/20        Normal Device Function?:  Yes     Electrophysiologist's Recommendations:    Have magnet available.  Provide continuous ECG monitoring when magnet is used or reprogramming is to be performed.   Procedure may interfere with device function.  Magnet should be placed over device during procedure.   If magnet placement is not possible throughout procedure, please have industry present for reprogramming to prevent tachy therapy.    Per Device Clinic Standing Orders, York Ram  01/01/2021 2:40 PM

## 2021-01-01 NOTE — Telephone Encounter (Signed)
Contacted patient to make her aware clearance had been sent. See telephone encounter 12/22/20 regarding clearance. She was appreciative of the call.   Will remove from preop pool.  Loel Dubonnet, NP

## 2021-01-19 ENCOUNTER — Other Ambulatory Visit: Payer: Self-pay

## 2021-01-19 ENCOUNTER — Other Ambulatory Visit: Payer: Federal, State, Local not specified - PPO

## 2021-01-19 ENCOUNTER — Other Ambulatory Visit: Payer: Self-pay | Admitting: Endocrinology

## 2021-01-19 DIAGNOSIS — E213 Hyperparathyroidism, unspecified: Secondary | ICD-10-CM

## 2021-01-19 DIAGNOSIS — E871 Hypo-osmolality and hyponatremia: Secondary | ICD-10-CM

## 2021-01-22 ENCOUNTER — Ambulatory Visit: Payer: Federal, State, Local not specified - PPO | Admitting: Endocrinology

## 2021-01-22 NOTE — Progress Notes (Signed)
DUE TO COVID-19 ONLY ONE VISITOR IS ALLOWED TO COME WITH YOU AND STAY IN THE WAITING ROOM ONLY DURING PRE OP AND PROCEDURE DAY OF SURGERY. THE 1 VISITOR  MAY VISIT WITH YOU AFTER SURGERY IN YOUR PRIVATE ROOM DURING VISITING HOURS ONLY!  YOU NEED TO HAVE A COVID 19 TEST ON__7/08/2020 _____ @_______ , THIS TEST MUST BE DONE BEFORE SURGERY,  COVID TESTING SITE [address] [place] [address], IT IS ON THE RIGHT GOING OUT [address] APPROXIMATELY  2 MINUTES PAST [employer] ON THE RIGHT. ONCE YOUR COVID TEST IS COMPLETED,  PLEASE BEGIN THE QUARANTINE INSTRUCTIONS AS OUTLINED IN YOUR HANDOUT.                Pam [name]  01/22/2021   Your procedure is scheduled on:         02/02/2021   Report to [facility] Main  Entrance   Report to admitting at    [phone]     Call this number if you have problems the morning of surgery [phone]    REMEMBER: NO  SOLID FOOD CANDY OR GUM AFTER MIDNIGHT. CLEAR LIQUIDS UNTIL   0430am          . NOTHING BY MOUTH EXCEPT CLEAR LIQUIDS UNTIL    0430am    . PLEASE FINISH ENSURE DRINK PER SURGEON ORDER  WHICH NEEDS TO BE COMPLETED AT    0430am   .      CLEAR LIQUID DIET   Foods Allowed                                                                    Coffee and tea, regular and decaf                            Fruit ices (not with fruit pulp)                                      Iced Popsicles                                    Carbonated beverages, regular and diet                                    Cranberry, grape and apple juices Sports drinks like Gatorade Lightly seasoned clear broth or consume(fat free) Sugar, honey syrup ___________________________________________________________________      BRUSH YOUR TEETH MORNING OF SURGERY AND RINSE YOUR MOUTH OUT, NO CHEWING GUM CANDY OR MINTS.     Take these medicines the morning of surgery with A SIP OF WATER:  ranexa, coreg, synthroid  DO NOT TAKE ANY DIABETIC MEDICATIONS  DAY OF YOUR SURGERY                               You may not have any metal on your body including hair pins and  piercings  Do not wear jewelry, make-up, lotions, powders or perfumes, deodorant             Do not wear nail polish on your fingernails.  Do not shave  48 hours prior to surgery.              Men may shave face and neck.   Do not bring valuables to the hospital. [place].  Contacts, dentures or bridgework may not be worn into surgery.  Leave suitcase in the car. After surgery it may be brought to your room.     Patients discharged the day of surgery will not be allowed to drive home. IF YOU ARE HAVING SURGERY AND GOING HOME THE SAME DAY, YOU MUST HAVE AN ADULT TO DRIVE YOU HOME AND BE WITH YOU FOR 24 HOURS. YOU MAY GO HOME BY TAXI OR UBER OR ORTHERWISE, BUT AN ADULT MUST ACCOMPANY YOU HOME AND STAY WITH YOU FOR 24 HOURS.  Name and phone number of your driver:  Special Instructions: N/A              Please read over the following fact sheets you were given: _____________________________________________________________________  [employer] - Preparing for Surgery Before surgery, you can play an important role.  Because skin is not sterile, your skin needs to be as free of germs as possible.  You can reduce the number of germs on your skin by washing with CHG (chlorahexidine gluconate) soap before surgery.  CHG is an antiseptic cleaner which kills germs and bonds with the skin to continue killing germs even after washing. Please DO NOT use if you have an allergy to CHG or antibacterial soaps.  If your skin becomes reddened/irritated stop using the CHG and inform your nurse when you arrive at Short Stay. Do not shave (including legs and underarms) for at least 48 hours prior to the first CHG shower.  You may shave your face/neck. Please follow these instructions carefully:  1.  Shower with CHG Soap the night before  surgery and the  morning of Surgery.  2.  If you choose to wash your hair, wash your hair first as usual with your  normal  shampoo.  3.  After you shampoo, rinse your hair and body thoroughly to remove the  shampoo.                           4.  Use CHG as you would any other liquid soap.  You can apply chg directly  to the skin and wash                       Gently with a scrungie or clean washcloth.  5.  Apply the CHG Soap to your body ONLY FROM THE NECK DOWN.   Do not use on face/ open                           Wound or open sores. Avoid contact with eyes, ears mouth and genitals (private parts).                       Wash face,  Genitals (private parts) with your normal soap.             6.  Wash thoroughly, paying special attention to the area where your surgery  will be performed.  7.  Thoroughly rinse your body with warm water from the neck down.  8.  DO NOT shower/wash with your normal soap after using and rinsing off  the CHG Soap.                9.  Pat yourself dry with a clean towel.            10.  Wear clean pajamas.            11.  Place clean sheets on your bed the night of your first shower and do not  sleep with pets. Day of Surgery : Do not apply any lotions/deodorants the morning of surgery.  Please wear clean clothes to the hospital/surgery center.  FAILURE TO FOLLOW THESE INSTRUCTIONS MAY RESULT IN THE CANCELLATION OF YOUR SURGERY PATIENT SIGNATURE_________________________________  NURSE SIGNATURE__________________________________  ________________________________________________________________________

## 2021-01-25 ENCOUNTER — Other Ambulatory Visit: Payer: Self-pay

## 2021-01-25 ENCOUNTER — Encounter (HOSPITAL_COMMUNITY): Payer: Self-pay

## 2021-01-25 ENCOUNTER — Encounter (HOSPITAL_COMMUNITY)
Admission: RE | Admit: 2021-01-25 | Discharge: 2021-01-25 | Disposition: A | Discharge status: Home / Self Care | Payer: Federal, State, Local not specified - PPO | Source: Ambulatory Visit | Attending: Surgery | Admitting: Surgery

## 2021-01-25 DIAGNOSIS — Z01812 Encounter for preprocedural laboratory examination: Secondary | ICD-10-CM | POA: Diagnosis present

## 2021-01-25 HISTORY — DX: Presence of automatic (implantable) cardiac defibrillator: Z95.810

## 2021-01-25 LAB — CBC
HCT: 41.4 % (ref 36.0–46.0)
Hemoglobin: 13.7 g/dL (ref 12.0–15.0)
MCH: 30.2 pg (ref 26.0–34.0)
MCHC: 33.1 g/dL (ref 30.0–36.0)
MCV: 91.4 fL (ref 80.0–100.0)
Platelets: 305 10*3/uL (ref 150–400)
RBC: 4.53 MIL/uL (ref 3.87–5.11)
RDW: 14.9 % (ref 11.5–15.5)
WBC: 5.1 10*3/uL (ref 4.0–10.5)
nRBC: 0 % (ref 0.0–0.2)

## 2021-01-25 LAB — BASIC METABOLIC PANEL
Anion gap: 3 — ABNORMAL LOW (ref 5–15)
BUN: 13 mg/dL (ref 8–23)
CO2: 28 mmol/L (ref 22–32)
Calcium: 11.1 mg/dL — ABNORMAL HIGH (ref 8.9–10.3)
Chloride: 100 mmol/L (ref 98–111)
Creatinine, Ser: 0.8 mg/dL (ref 0.44–1.00)
GFR, Estimated: >60 mL/min (ref >60)
Glucose, Bld: 96 mg/dL (ref 70–99)
Potassium: 5.1 mmol/L (ref 3.5–5.1)
Sodium: 131 mmol/L — ABNORMAL LOW (ref 135–145)

## 2021-01-25 NOTE — Progress Notes (Signed)
Anesthesia Chart Review   Case: [id number] Date/Time: 02/02/21 0715   Procedure: RIGHT SUPERIOR PARATHYROIDECTOMY (Right)   Anesthesia type: General   Pre-op diagnosis: PRIMARY HYPERPARATHYROIDISM   Location: [facility] ROOM 03 / [facility]   Surgeons: Armandina Gemma, MD       DISCUSSION:69 y.o. former smoker with h/o HTN, ischemic cardiomyopathy s/p AICD (device orders in progress note 01/01/2021), primary hyperparathyroidism scheduled for above procedure 02/02/2021 with Dr. Armandina Gemma.   Per cardiology note 12/23/2020,   "Recommendations: Based on ACC/AHA guidelines, the patient is at acceptable risk for the planned procedure and may proceed without further cardiovascular testing.  Clopidogrel (Plavix) can be held for 5 days prior to surgery and should be resumed as soon as possible postop when felt to be safe."  Anticipate pt can proceed with planned procedure barring acute status change.   VS: BP (!) 160/69   Pulse 62   Temp 36.8 C (Oral)   Resp 16   Ht 5\' 3"  (1.6 m)   Wt 54.9 kg   SpO2 100%   BMI 21.43 kg/m   PROVIDERS: Loraine Leriche., MD is PCP   Lauree Chandler, MD is Cardiologist  LABS: Labs reviewed: Acceptable for surgery. (all labs ordered are listed, but only abnormal results are displayed)  Labs Reviewed  BASIC METABOLIC PANEL - Abnormal; Notable for the following components:      Result Value   Sodium 131 (*)    Calcium 11.1 (*)    Anion gap 3 (*)    All other components within normal limits  CBC     IMAGES:   EKG: 08/25/2020 Rate 62 bpm   CV: Echo 06/19/2019  1. Left ventricular ejection fraction, by visual estimation, is 25 to  30%. The left ventricle has severely decreased function. Left ventricular  septal wall thickness was normal. There is no left ventricular  hypertrophy.   2. Severely dilated left ventricular internal cavity size.   3. Mid and apical segments severely hypokinetic Preserved basal funciton.   4. Global right ventricle has  normal systolic function.The right  ventricular size is normal. No increase in right ventricular wall  thickness.   5. Left atrial size was mildly dilated.   6. Right atrial size was normal.   7. The mitral valve is normal in structure. Mild mitral valve  regurgitation. No evidence of mitral stenosis.   8. The tricuspid valve is normal in structure. Tricuspid valve  regurgitation is not demonstrated.   9. The aortic valve is normal in structure. Aortic valve regurgitation is  not visualized. No evidence of aortic valve sclerosis or stenosis.  10. The pulmonic valve was normal in structure. Pulmonic valve  regurgitation is not visualized.  11. Mildly elevated pulmonary artery systolic pressure.  12. The inferior vena cava is normal in size with greater than 50%  respiratory variability, suggesting right atrial pressure of 3 mmHg. \ Past Medical History:  Diagnosis Date   Abnormal EKG    AICD (automatic cardioverter/defibrillator) present    Alcoholic intoxication without complication (HCC)    Allergy    Cardiomyopathy, ischemic    Cataract    Chest pain 08/29/2016   CHF (congestive heart failure) ([place])    Chronic systolic heart failure ([place]) 08/29/2016   Facial laceration    Fall 07/07/2016   Hyperlipidemia    Hypertension 03/03/2017   Hyponatremia    Hypothyroidism 07/08/2016   NSTEMI (non-ST elevated myocardial infarction) [facility])    Syncope    Thyroid disease  Past Surgical History:  Procedure Laterality Date   CARDIAC CATHETERIZATION N/A 07/08/2016   Procedure: Left Heart Cath and Coronary Angiography;  Surgeon: [name], MD;  Location: [place] [facility];  Service: Cardiovascular;  Laterality: N/A;   CARDIAC CATHETERIZATION N/A 08/29/2016   Procedure: Coronary Atherectomy;  Surgeon: [name], MD;  Location: [place] [facility];  Service: Cardiovascular;  Laterality: N/A;   CARDIAC CATHETERIZATION N/A 08/29/2016   Procedure: Coronary Stent Intervention;   Surgeon: [name], MD;  Location: [place] [facility];  Service: Cardiovascular;  Laterality: N/A;   CATARACT EXTRACTION, BILATERAL     CORONARY ANGIOPLASTY     coronary stents      ICD IMPLANT N/A 08/28/2019   Procedure: ICD IMPLANT;  Surgeon: Constance Haw, MD;  Location: [place] [facility];  Service: Cardiovascular;  Laterality: N/A;   VAGINAL HYSTERECTOMY      MEDICATIONS:  acetaminophen (TYLENOL) 500 MG tablet   alendronate (FOSAMAX) 70 MG tablet   atorvastatin (LIPITOR) 80 MG tablet   carvedilol (COREG) 6.25 MG tablet   clopidogrel (PLAVIX) 75 MG tablet   furosemide (LASIX) 20 MG tablet   levothyroxine (SYNTHROID) 25 MCG tablet   losartan (COZAAR) 100 MG tablet   nitroGLYCERIN (NITROSTAT) 0.4 MG SL tablet   ranolazine (RANEXA) 1000 MG SR tablet   No current facility-administered medications for this encounter.    Konrad Felix, PA-C [facility] [phone]

## 2021-01-25 NOTE — Anesthesia Preprocedure Evaluation (Addendum)
Anesthesia Evaluation  Patient identified by MRN, date of birth, ID band Patient awake    Reviewed: Allergy & Precautions, NPO status , Patient's Chart, lab work & pertinent test results  Airway Mallampati: II  TM Distance: >3 FB Neck ROM: Full    Dental no notable dental hx.    Pulmonary neg pulmonary ROS, former smoker,    Pulmonary exam normal breath sounds clear to auscultation       Cardiovascular hypertension, + CAD, + Past MI and +CHF  Normal cardiovascular exam+ Cardiac Defibrillator  Rhythm:Regular Rate:Normal     Neuro/Psych negative neurological ROS  negative psych ROS   GI/Hepatic negative GI ROS, Neg liver ROS,   Endo/Other  Hypothyroidism   Renal/GU negative Renal ROS  negative genitourinary   Musculoskeletal negative musculoskeletal ROS (+)   Abdominal   Peds negative pediatric ROS (+)  Hematology negative hematology ROS (+)   Anesthesia Other Findings   Reproductive/Obstetrics negative OB ROS                            Anesthesia Physical Anesthesia Plan  ASA: 3  Anesthesia Plan: General   Post-op Pain Management:    Induction: Intravenous  PONV Risk Score and Plan: 3 and Ondansetron, Dexamethasone and Treatment may vary due to age or medical condition  Airway Management Planned: Oral ETT  Additional Equipment:   Intra-op Plan:   Post-operative Plan: Extubation in OR  Informed Consent: I have reviewed the patients History and Physical, chart, labs and discussed the procedure including the risks, benefits and alternatives for the proposed anesthesia with the patient or authorized representative who has indicated his/her understanding and acceptance.     Dental advisory given  Plan Discussed with: CRNA and Surgeon  Anesthesia Plan Comments: (See PAT note 01/25/2021, Konrad Felix, PA-C)       Anesthesia Quick Evaluation

## 2021-01-25 NOTE — Progress Notes (Addendum)
        Anesthesia Review:  PCP: DR Idamae Schuller  Cardiologist : DR Angelena Form  LOV 12/21/20  DR Will Camnitz  LOV 08/25/20.   Lat Device check 11/26/20  Device orders requested on 01/25/21.   Chest x-ray : 06/27/20  EKG : 08/25/20  Echo : 2020  Stress test: Cardiac Cath : 2017  Activity level:  can do a flgiht of stairs without difficulty  Sleep Study/ CPAP : none  Fasting Blood Sugar :      / Checks Blood Sugar -- times a day:   Blood Thinner/ Instructions /Last Dose: ASA / Instructions/ Last Dose :   Plavix- Last dose 01/28/2021 BMp done 01/25/21 routed to Dr Harlow Asa.

## 2021-01-29 ENCOUNTER — Other Ambulatory Visit (HOSPITAL_COMMUNITY)
Admission: RE | Admit: 2021-01-29 | Discharge: 2021-01-29 | Disposition: A | Discharge status: Home / Self Care | Payer: Federal, State, Local not specified - PPO | Source: Ambulatory Visit | Attending: Surgery | Admitting: Surgery

## 2021-01-29 DIAGNOSIS — Z20822 Contact with and (suspected) exposure to covid-19: Secondary | ICD-10-CM | POA: Diagnosis not present

## 2021-01-29 DIAGNOSIS — Z01812 Encounter for preprocedural laboratory examination: Secondary | ICD-10-CM | POA: Insufficient documentation

## 2021-01-29 LAB — SARS CORONAVIRUS 2 (TAT 6-24 HRS): SARS Coronavirus 2: NEGATIVE

## 2021-02-02 ENCOUNTER — Ambulatory Visit (HOSPITAL_COMMUNITY): Payer: Federal, State, Local not specified - PPO | Admitting: Physician Assistant

## 2021-02-02 ENCOUNTER — Ambulatory Visit (HOSPITAL_COMMUNITY): Payer: Federal, State, Local not specified - PPO | Admitting: Certified Registered"

## 2021-02-02 ENCOUNTER — Ambulatory Visit (HOSPITAL_COMMUNITY)
Admission: RE | Admit: 2021-02-02 | Discharge: 2021-02-03 | Disposition: A | Discharge status: Home / Self Care | Payer: Federal, State, Local not specified - PPO | Source: Ambulatory Visit | Attending: Surgery | Admitting: Surgery

## 2021-02-02 ENCOUNTER — Other Ambulatory Visit: Payer: Self-pay

## 2021-02-02 ENCOUNTER — Encounter (HOSPITAL_COMMUNITY): Payer: Self-pay | Admitting: Surgery

## 2021-02-02 ENCOUNTER — Encounter (HOSPITAL_COMMUNITY)
Admission: RE | Disposition: A | Discharge status: Home / Self Care | Payer: Self-pay | Source: Ambulatory Visit | Attending: Surgery

## 2021-02-02 DIAGNOSIS — Z79899 Other long term (current) drug therapy: Secondary | ICD-10-CM | POA: Diagnosis not present

## 2021-02-02 DIAGNOSIS — Z87891 Personal history of nicotine dependence: Secondary | ICD-10-CM | POA: Insufficient documentation

## 2021-02-02 DIAGNOSIS — D351 Benign neoplasm of parathyroid gland: Secondary | ICD-10-CM | POA: Insufficient documentation

## 2021-02-02 DIAGNOSIS — E210 Primary hyperparathyroidism: Secondary | ICD-10-CM | POA: Diagnosis present

## 2021-02-02 DIAGNOSIS — Z8249 Family history of ischemic heart disease and other diseases of the circulatory system: Secondary | ICD-10-CM | POA: Insufficient documentation

## 2021-02-02 DIAGNOSIS — Z888 Allergy status to other drugs, medicaments and biological substances status: Secondary | ICD-10-CM | POA: Diagnosis not present

## 2021-02-02 HISTORY — PX: PARATHYROIDECTOMY: SHX19

## 2021-02-02 SURGERY — PARATHYROIDECTOMY
Anesthesia: General | Laterality: Right

## 2021-02-02 MED ORDER — FENTANYL CITRATE (PF) 100 MCG/2ML IJ SOLN
INTRAMUSCULAR | Status: DC | PRN
  Administered 2021-02-02: 25 ug via INTRAVENOUS
  Administered 2021-02-02: 50 ug via INTRAVENOUS

## 2021-02-02 MED ORDER — ACETAMINOPHEN 10 MG/ML IV SOLN
INTRAVENOUS | Status: AC
  Filled 2021-02-02: qty 100

## 2021-02-02 MED ORDER — HYDROMORPHONE HCL 1 MG/ML IJ SOLN
INTRAMUSCULAR | Status: AC
  Filled 2021-02-02: qty 1

## 2021-02-02 MED ORDER — RANOLAZINE ER 500 MG PO TB12: 1000.0000 mg | ORAL_TABLET | Freq: Two times a day (BID) | ORAL | Status: DC

## 2021-02-02 MED ORDER — ACETAMINOPHEN 10 MG/ML IV SOLN
1000.0000 mg | Freq: Once | INTRAVENOUS | Status: AC
  Administered 2021-02-02: 1000 mg via INTRAVENOUS

## 2021-02-02 MED ORDER — ONDANSETRON HCL 4 MG/2ML IJ SOLN
INTRAMUSCULAR | Status: AC
  Filled 2021-02-02: qty 2

## 2021-02-02 MED ORDER — ONDANSETRON 4 MG PO TBDP: 4.0000 mg | ORAL_TABLET | Freq: Four times a day (QID) | ORAL | Status: DC | PRN

## 2021-02-02 MED ORDER — PHENYLEPHRINE HCL (PRESSORS) 10 MG/ML IV SOLN
INTRAVENOUS | Status: AC
  Filled 2021-02-02: qty 1

## 2021-02-02 MED ORDER — DEXAMETHASONE SODIUM PHOSPHATE 10 MG/ML IJ SOLN
INTRAMUSCULAR | Status: AC
  Filled 2021-02-02: qty 1

## 2021-02-02 MED ORDER — CHLORHEXIDINE GLUCONATE CLOTH 2 % EX PADS: 6.0000 | MEDICATED_PAD | Freq: Once | CUTANEOUS | Status: DC

## 2021-02-02 MED ORDER — PROPOFOL 10 MG/ML IV BOLUS
INTRAVENOUS | Status: DC | PRN
  Administered 2021-02-02: 100 mg via INTRAVENOUS

## 2021-02-02 MED ORDER — BUPIVACAINE HCL 0.25 % IJ SOLN
INTRAMUSCULAR | Status: AC
  Filled 2021-02-02: qty 1

## 2021-02-02 MED ORDER — ONDANSETRON HCL 4 MG/2ML IJ SOLN: 4.0000 mg | Freq: Once | INTRAMUSCULAR | Status: DC | PRN

## 2021-02-02 MED ORDER — MIDAZOLAM HCL 2 MG/2ML IJ SOLN
INTRAMUSCULAR | Status: AC
  Filled 2021-02-02: qty 2

## 2021-02-02 MED ORDER — TRAMADOL HCL 50 MG PO TABS
50.0000 mg | ORAL_TABLET | Freq: Four times a day (QID) | ORAL | Status: DC | PRN
Start: 2021-02-02 — End: 2021-02-03
  Administered 2021-02-02 (×2): 50 mg via ORAL
  Filled 2021-02-02 (×2): qty 1

## 2021-02-02 MED ORDER — SUGAMMADEX SODIUM 200 MG/2ML IV SOLN
INTRAVENOUS | Status: DC | PRN
  Administered 2021-02-02: 200 mg via INTRAVENOUS

## 2021-02-02 MED ORDER — SODIUM CHLORIDE 0.45 % IV SOLN: INTRAVENOUS | Status: DC

## 2021-02-02 MED ORDER — LACTATED RINGERS IV SOLN: INTRAVENOUS | Status: DC

## 2021-02-02 MED ORDER — 0.9 % SODIUM CHLORIDE (POUR BTL) OPTIME
TOPICAL | Status: DC | PRN
  Administered 2021-02-02: 1000 mL

## 2021-02-02 MED ORDER — OXYCODONE HCL 5 MG/5ML PO SOLN: 5.0000 mg | Freq: Once | ORAL | Status: AC | PRN

## 2021-02-02 MED ORDER — ONDANSETRON HCL 4 MG/2ML IJ SOLN
INTRAMUSCULAR | Status: DC | PRN
  Administered 2021-02-02: 4 mg via INTRAVENOUS

## 2021-02-02 MED ORDER — HYDROCODONE-ACETAMINOPHEN 5-325 MG PO TABS
1.0000 | ORAL_TABLET | ORAL | Status: DC | PRN
  Administered 2021-02-02: 1 via ORAL
  Filled 2021-02-02: qty 1

## 2021-02-02 MED ORDER — ROCURONIUM BROMIDE 100 MG/10ML IV SOLN
INTRAVENOUS | Status: DC | PRN
  Administered 2021-02-02: 60 mg via INTRAVENOUS

## 2021-02-02 MED ORDER — LIDOCAINE 2% (20 MG/ML) 5 ML SYRINGE
INTRAMUSCULAR | Status: AC
  Filled 2021-02-02: qty 5

## 2021-02-02 MED ORDER — ORAL CARE MOUTH RINSE: 15.0000 mL | Freq: Once | OROMUCOSAL | Status: AC

## 2021-02-02 MED ORDER — MIDAZOLAM HCL 5 MG/5ML IJ SOLN
INTRAMUSCULAR | Status: DC | PRN
  Administered 2021-02-02: 1 mg via INTRAVENOUS

## 2021-02-02 MED ORDER — OXYCODONE HCL 5 MG PO TABS
5.0000 mg | ORAL_TABLET | Freq: Once | ORAL | Status: AC | PRN
  Administered 2021-02-02: 5 mg via ORAL

## 2021-02-02 MED ORDER — EPHEDRINE SULFATE-NACL 50-0.9 MG/10ML-% IV SOSY
PREFILLED_SYRINGE | INTRAVENOUS | Status: DC | PRN
  Administered 2021-02-02: 10 mg via INTRAVENOUS
  Administered 2021-02-02: 5 mg via INTRAVENOUS

## 2021-02-02 MED ORDER — OXYCODONE HCL 5 MG PO TABS
ORAL_TABLET | ORAL | Status: AC
  Filled 2021-02-02: qty 1

## 2021-02-02 MED ORDER — LOSARTAN POTASSIUM 50 MG PO TABS: 100.0000 mg | ORAL_TABLET | Freq: Every day | ORAL | Status: DC

## 2021-02-02 MED ORDER — DEXAMETHASONE SODIUM PHOSPHATE 10 MG/ML IJ SOLN
INTRAMUSCULAR | Status: DC | PRN
  Administered 2021-02-02: 8 mg via INTRAVENOUS

## 2021-02-02 MED ORDER — RANOLAZINE ER 500 MG PO TB12
1000.0000 mg | ORAL_TABLET | Freq: Two times a day (BID) | ORAL | Status: DC
  Administered 2021-02-02 (×2): 1000 mg via ORAL
  Filled 2021-02-02 (×3): qty 2

## 2021-02-02 MED ORDER — FENTANYL CITRATE (PF) 100 MCG/2ML IJ SOLN
INTRAMUSCULAR | Status: AC
  Filled 2021-02-02: qty 2

## 2021-02-02 MED ORDER — CARVEDILOL 6.25 MG PO TABS
6.2500 mg | ORAL_TABLET | Freq: Two times a day (BID) | ORAL | Status: DC
  Administered 2021-02-02: 6.25 mg via ORAL
  Filled 2021-02-02: qty 1

## 2021-02-02 MED ORDER — HYDROMORPHONE HCL 1 MG/ML IJ SOLN
0.2500 mg | INTRAMUSCULAR | Status: DC | PRN
  Administered 2021-02-02 (×4): 0.5 mg via INTRAVENOUS

## 2021-02-02 MED ORDER — ONDANSETRON HCL 4 MG/2ML IJ SOLN: 4.0000 mg | Freq: Four times a day (QID) | INTRAMUSCULAR | Status: DC | PRN

## 2021-02-02 MED ORDER — ACETAMINOPHEN 325 MG PO TABS
650.0000 mg | ORAL_TABLET | Freq: Four times a day (QID) | ORAL | Status: DC | PRN
  Administered 2021-02-02 – 2021-02-03 (×2): 650 mg via ORAL
  Filled 2021-02-02 (×2): qty 2

## 2021-02-02 MED ORDER — BUPIVACAINE HCL 0.25 % IJ SOLN
INTRAMUSCULAR | Status: DC | PRN
  Administered 2021-02-02: 8 mL

## 2021-02-02 MED ORDER — LIDOCAINE 2% (20 MG/ML) 5 ML SYRINGE
INTRAMUSCULAR | Status: DC | PRN
  Administered 2021-02-02: 100 mg via INTRAVENOUS

## 2021-02-02 MED ORDER — CEFAZOLIN SODIUM-DEXTROSE 2-4 GM/100ML-% IV SOLN
2.0000 g | INTRAVENOUS | Status: AC
  Administered 2021-02-02: 2 g via INTRAVENOUS
  Filled 2021-02-02: qty 100

## 2021-02-02 MED ORDER — PROPOFOL 10 MG/ML IV BOLUS
INTRAVENOUS | Status: AC
  Filled 2021-02-02: qty 20

## 2021-02-02 MED ORDER — CHLORHEXIDINE GLUCONATE 0.12 % MT SOLN
15.0000 mL | Freq: Once | OROMUCOSAL | Status: AC
  Administered 2021-02-02: 15 mL via OROMUCOSAL

## 2021-02-02 MED ORDER — LOSARTAN POTASSIUM 50 MG PO TABS
100.0000 mg | ORAL_TABLET | Freq: Every day | ORAL | Status: DC
  Administered 2021-02-02: 100 mg via ORAL
  Filled 2021-02-02: qty 2

## 2021-02-02 MED ORDER — ACETAMINOPHEN 650 MG RE SUPP: 650.0000 mg | Freq: Four times a day (QID) | RECTAL | Status: DC | PRN

## 2021-02-02 SURGICAL SUPPLY — 32 items
ATTRACTOMAT 16X20 MAGNETIC DRP (DRAPES) ×3 IMPLANT
BAG COUNTER SPONGE SURGICOUNT (BAG) ×2 IMPLANT
BAG SURGICOUNT SPONGE COUNTING (BAG) ×1
BLADE SURG 15 STRL LF DISP TIS (BLADE) ×1 IMPLANT
BLADE SURG 15 STRL SS (BLADE) ×2
CHLORAPREP W/TINT 26 (MISCELLANEOUS) ×3 IMPLANT
CLIP VESOCCLUDE MED 6/CT (CLIP) ×6 IMPLANT
CLIP VESOCCLUDE SM WIDE 6/CT (CLIP) ×6 IMPLANT
COVER SURGICAL LIGHT HANDLE (MISCELLANEOUS) ×3 IMPLANT
DERMABOND ADVANCED (GAUZE/BANDAGES/DRESSINGS) ×2
DERMABOND ADVANCED .7 DNX12 (GAUZE/BANDAGES/DRESSINGS) ×1 IMPLANT
DRAPE LAPAROTOMY T 98X78 PEDS (DRAPES) ×3 IMPLANT
DRAPE UTILITY XL STRL (DRAPES) ×3 IMPLANT
ELECT REM PT RETURN 15FT ADLT (MISCELLANEOUS) ×3 IMPLANT
GAUZE 4X4 16PLY ~~LOC~~+RFID DBL (SPONGE) ×3 IMPLANT
GLOVE SURG ORTHO LTX SZ8 (GLOVE) ×3 IMPLANT
GOWN STRL REUS W/TWL XL LVL3 (GOWN DISPOSABLE) ×9 IMPLANT
HEMOSTAT SURGICEL 2X4 FIBR (HEMOSTASIS) ×3 IMPLANT
ILLUMINATOR WAVEGUIDE N/F (MISCELLANEOUS) ×3 IMPLANT
KIT BASIN OR (CUSTOM PROCEDURE TRAY) ×3 IMPLANT
KIT TURNOVER KIT A (KITS) ×3 IMPLANT
NEEDLE HYPO 25X1 1.5 SAFETY (NEEDLE) ×3 IMPLANT
PACK BASIC VI WITH GOWN DISP (CUSTOM PROCEDURE TRAY) ×3 IMPLANT
PENCIL SMOKE EVACUATOR (MISCELLANEOUS) ×3 IMPLANT
SUT MNCRL AB 4-0 PS2 18 (SUTURE) ×3 IMPLANT
SUT VIC AB 3-0 SH 18 (SUTURE) ×3 IMPLANT
SYR BULB IRRIG 60ML STRL (SYRINGE) ×3 IMPLANT
SYR CONTROL 10ML LL (SYRINGE) ×3 IMPLANT
TOWEL OR 17X26 10 PK STRL BLUE (TOWEL DISPOSABLE) ×3 IMPLANT
TOWEL OR NON WOVEN STRL DISP B (DISPOSABLE) ×3 IMPLANT
TUBING CONNECTING 10 (TUBING) ×2 IMPLANT
TUBING CONNECTING 10' (TUBING) ×1

## 2021-02-02 NOTE — Progress Notes (Signed)
Patient admitted to room. Alert and oriented x4. No pain or discomfort at time of arrival. Oriented to room and staffs. Will continue to monitor.

## 2021-02-02 NOTE — Transfer of Care (Signed)
Immediate Anesthesia Transfer of Care Note  Patient: Ondria Orrico  Procedure(s) Performed: RIGHT SUPERIOR PARATHYROIDECTOMY (Right)  Patient Location: PACU  Anesthesia Type:General  Level of Consciousness: drowsy and patient cooperative  Airway & Oxygen Therapy: Patient Spontanous Breathing and Patient connected to face mask oxygen  Post-op Assessment: Report given to RN and Post -op Vital signs reviewed and stable  Post vital signs: Reviewed and stable  Last Vitals:  Vitals Value Taken Time  BP 170/69 02/02/21 0904  Temp    Pulse 59 02/02/21 0910  Resp 16 02/02/21 0910  SpO2 100 % 02/02/21 0910  Vitals shown include unvalidated device data.  Last Pain:  Vitals:   02/02/21 0554  TempSrc: Oral  PainSc:          Complications: No notable events documented.

## 2021-02-02 NOTE — Plan of Care (Signed)
  Problem: Safety: Goal: Ability to remain free from injury will improve Outcome: Progressing   Problem: Pain Managment: Goal: General experience of comfort will improve Outcome: Progressing   

## 2021-02-02 NOTE — Interval H&P Note (Signed)
History and Physical Interval Note:  02/02/2021 7:11 AM  Pam [name]  has presented today for surgery, with the diagnosis of PRIMARY HYPERPARATHYROIDISM.  The various methods of treatment have been discussed with the patient and family. After consideration of risks, benefits and other options for treatment, the patient has consented to    Procedure(s): RIGHT SUPERIOR PARATHYROIDECTOMY (Right) as a surgical intervention.    The patient's history has been reviewed, patient examined, no change in status, stable for surgery.  I have reviewed the patient's chart and labs.  Questions were answered to the patient's satisfaction.    Armandina Gemma, MD [facility] Office: [place]

## 2021-02-02 NOTE — Anesthesia Postprocedure Evaluation (Signed)
Anesthesia Post Note  Patient: Pam [name]  Procedure(s) Performed: RIGHT SUPERIOR PARATHYROIDECTOMY (Right)     Patient location during evaluation: PACU Anesthesia Type: General Level of consciousness: awake and alert Pain management: pain level controlled Vital Signs Assessment: post-procedure vital signs reviewed and stable Respiratory status: spontaneous breathing, nonlabored ventilation, respiratory function stable and patient connected to nasal cannula oxygen Cardiovascular status: blood pressure returned to baseline and stable Postop Assessment: no apparent nausea or vomiting Anesthetic complications: no   No notable events documented.  Last Vitals:  Vitals:   02/02/21 1030 02/02/21 1045  BP: 129/60 124/61  Pulse: 60 (!) 59  Resp: 12 10  Temp: (!) 36.3 C (!) 36.3 C  SpO2: 97% 93%    Last Pain:  Vitals:   02/02/21 1045  TempSrc:   PainSc: Asleep                 Edvardo Honse S

## 2021-02-02 NOTE — Op Note (Signed)
OPERATIVE REPORT - PARATHYROIDECTOMY  Preoperative diagnosis: Primary hyperparathyroidism  Postop diagnosis: Same  Procedure: Right superior minimally invasive parathyroidectomy  Surgeon:  Armandina Gemma, MD  Assistant:  Lucas Mallow, MD ([facility] Resident)  Anesthesia: General endotracheal  Estimated blood loss: Minimal  Preparation: ChloraPrep  Indications: Patient is referred by Dr. Elayne Snare for surgical evaluation and management of her very hyperparathyroidism.  Patient was noted to have elevated serum calcium levels.  Her highest level was in April 2022 at 12.2.  Patient had an elevated intact PTH level of 107.  25-hydroxy vitamin D level was normal at 56.5.  Patient underwent nuclear medicine parathyroid scan on October 23, 2020.  This suggested a right superior parathyroid adenoma but was not felt to be definitive.  Patient subsequently underwent a 4D CT scan of the neck on November 25, 2020.  This did localize a 12 mm nodule at the superior pole of the right thyroid lobe consistent with parathyroid adenoma.  Patient is now referred for surgical evaluation for parathyroidectomy.  Procedure: The patient was prepared in the pre-operative holding area. The patient was brought to the operating room and placed in a supine position on the operating room table. Following administration of general anesthesia, the patient was positioned and then prepped and draped in the usual strict aseptic fashion. After ascertaining that an adequate level of anesthesia been achieved, a neck incision was made with a #15 blade. Dissection was carried through subcutaneous tissues and platysma. Hemostasis was obtained with the electrocautery. Skin flaps were developed circumferentially and a Weitlander retractor was placed for exposure.  Strap muscles were incised in the midline. Strap muscles were reflected laterally exposing the thyroid lobe. With gentle blunt dissection the thyroid lobe was mobilized.  Dissection was  carried through adipose tissue and an enlarged parathyroid gland was identified immediately posterior to the superior pole of the right lobe of the thyroid. It was gently mobilized. Vascular structures were divided between small ligaclips. Care was taken to avoid the recurrent laryngeal nerve and the esophagus. The parathyroid gland was completely excised. It was submitted to pathology where frozen section confirmed parathyroid tissue consistent with adenoma.  Neck was irrigated with warm saline and good hemostasis was noted. Fibrillar was placed in the operative field. Strap muscles were approximated in the midline with interrupted 3-0 Vicryl sutures. Platysma was closed with interrupted 3-0 Vicryl sutures. Marcaine was infiltrated circumferentially. Skin was closed with a running 4-0 Monocryl subcuticular suture. Wound was washed and dried and Dermabond was applied. Patient was awakened from anesthesia and brought to the recovery room. The patient tolerated the procedure well.   Armandina Gemma, MD [facility] Office: [phone]

## 2021-02-02 NOTE — Anesthesia Procedure Notes (Signed)
Procedure Name: Intubation Date/Time: 02/02/2021 8:00 AM Performed by: Gwyndolyn Saxon, CRNA Pre-anesthesia Checklist: Patient identified, Emergency Drugs available, Suction available and Patient being monitored Patient Re-evaluated:Patient Re-evaluated prior to induction Oxygen Delivery Method: Circle system utilized Preoxygenation: Pre-oxygenation with 100% oxygen Induction Type: IV induction Ventilation: Mask ventilation without difficulty Laryngoscope Size: Miller and 2 Grade View: Grade I Tube type: Oral Tube size: 6.5 mm Number of attempts: 1 Airway Equipment and Method: Patient positioned with wedge pillow and Stylet Placement Confirmation: ETT inserted through vocal cords under direct vision, positive ETCO2 and breath sounds checked- equal and bilateral Secured at: 20 cm Tube secured with: Tape Dental Injury: Teeth and Oropharynx as per pre-operative assessment

## 2021-02-02 NOTE — Progress Notes (Signed)
Patient ID: Pam [name], female   DOB: 1950/08/25, 70 y.o.   MRN: [id number]  [facility]  Patient seen and examined.  Husband in room.  Complains of pain.  Limited po intake.  Would prefer to stay the night.  Continue ice pack, pain Rx.  Plan home in AM 7/6.  Will check calcium in AM.  Armandina Gemma, MD [facility] Office: [phone]

## 2021-02-02 NOTE — H&P (Signed)
[facility]  Pam [name] DOB: 27-Sep-1950 Married / Language: English / Race: White Female   History of Present Illness   The patient is a 70 year old female who presents with primary hyperparathyroidism.  CHIEF COMPLAINT: primary hyperparathyroidism  Patient is referred by Dr. Elayne Snare for surgical evaluation and management of her very hyperparathyroidism.  Patient was noted to have elevated serum calcium levels.  Her highest level was in April 2022 at 12.2.  Patient had an elevated intact PTH level of 107.  25-hydroxy vitamin D level was normal at 56.5.  Patient underwent nuclear medicine parathyroid scan on October 23, 2020.  This suggested a right superior parathyroid adenoma but was not felt to be definitive.  Patient subsequently underwent a 4D CT scan of the neck on November 25, 2020.  This did localize a 12 mm nodule at the superior pole of the right thyroid lobe consistent with parathyroid adenoma.  Patient is now referred for surgical evaluation for parathyroidectomy.  Patient does have osteoporosis.  She has chronic fatigue.  She has had no prior head or neck surgery.  She does have a history of hypothyroidism and takes levothyroxine.  There is no family history of parathyroid disease or other endocrine neoplasm.  Patient has a significant cardiac history with stent placement approximately 5 years ago and a defibrillator placement one year ago.  Patient was recently evaluated by her cardiologist.   Past Surgical History Cataract Surgery   Bilateral. Colon Polyp Removal - Colonoscopy    Diagnostic Studies History  Colonoscopy   1-5 years ago Mammogram   within last year  Allergies  Lisinopril *ANTIHYPERTENSIVES*   Spironolactone *DIURETICS*   Allergies Reconciled    Medication History  Alendronate Sodium  (70MG  Tablet, Oral) Active. Atorvastatin Calcium  (80MG  Tablet, Oral) Active. Carvedilol  (6.25MG  Tablet, Oral) Active. Clopidogrel  Bisulfate  (75MG  Tablet, Oral) Active. Furosemide  (20MG  Tablet, Oral) Active. Levothyroxine Sodium  (25MCG Tablet, Oral) Active. Losartan Potassium  (100MG  Tablet, Oral) Active. Nitroglycerin  (0.4MG  Tab Sublingual, Sublingual) Active. Ranolazine ER  (1000MG  Tablet ER 12HR, Oral) Active. Tylenol  (500MG  Capsule, Oral) Active. Cholecalciferol  (25 MCG(1000 UT) Capsule, Oral) Active. Medications Reconciled   Social History  Caffeine use   Carbonated beverages. No drug use   Tobacco use   Former smoker.  Family History  Heart Disease   Sister. Heart disease in female family member before age 54   Thyroid problems   Sister.  Pregnancy / Birth History  Age at menarche   17 years. Gravida   2  Review of Systems Psychiatric Not Present- Anxiety, Bipolar, Change in Sleep Pattern, Depression, Fearful and Frequent crying. Hematology Present- Easy Bruising. Not Present- Blood Thinners, Excessive bleeding, Gland problems, HIV and Persistent Infections.  Vitals  Weight: 122.4 lb   Height: 62 in  Body Surface Area: 1.55 m   Body Mass Index: 22.39 kg/m   Pulse: 56 (Regular)    BP: 122/68(Sitting, Left Arm, Standard)  Physical Exam   GENERAL APPEARANCE Development: normal Nutritional status: normal Gross deformities: none  SKIN Rash, lesions, ulcers: none Induration, erythema: none Nodules: none palpable  EYES Conjunctiva and lids: normal Pupils: equal and reactive Iris: normal bilaterally  EARS, NOSE, MOUTH, THROAT External ears: no lesion or deformity External nose: no lesion or deformity Hearing: grossly normal Due to Covid-19 pandemic, patient is wearing a mask.  NECK Symmetric: yes Trachea: midline Thyroid: no palpable nodules in the thyroid bed  CHEST Respiratory effort: normal Retraction or accessory muscle use: no Breath sounds: normal bilaterally Rales, rhonchi, wheeze: none Defibrillator left upper chest wall  CARDIOVASCULAR Auscultation: regular  rhythm, normal rate Murmurs: none Pulses: radial pulse 2+ palpable Lower extremity edema: none  MUSCULOSKELETAL Station and gait: normal Digits and nails: no clubbing or cyanosis Muscle strength: grossly normal all extremities Range of motion: grossly normal all extremities Deformity: none  LYMPHATIC Cervical: none palpable Supraclavicular: none palpable  PSYCHIATRIC Oriented to person, place, and time: yes Mood and affect: normal for situation Judgment and insight: appropriate for situation    Assessment & Plan   PRIMARY HYPERPARATHYROIDISM (E21.0) OSTEOPOROSIS (M81.0)  Patient is referred by her endocrinologist for surgical evaluation for parathyroidectomy for management of primary hyperparathyroidism.   Patient provided with a copy of "Parathyroid Surgery: Treatment for Your Parathyroid Gland Problem", published by [employer], 12 pages.  Book reviewed and explained to patient during visit today.  Patient has biochemical evidence of primary hyperparathyroidism.  Localization studies include a nuclear medicine parathyroid scan and a 4D CT scan of the neck, both of which indicate the presence of a right superior parathyroid adenoma.  Today we discussed minimally invasive parathyroidectomy.  I provided the patient with written literature on parathyroid surgery to review at home.  We discussed the size and location of the surgical incision.  We discussed doing this as an outpatient surgical procedure.  We discussed potential complications including recurrent laryngeal nerve injury.  We discussed her postoperative recovery and return to normal activities.  She understands and wishes to proceed with surgery in the near future.  Patient does take Plavix.  This will need to be held for 5 days prior to surgery.  We will also obtain cardiac clearance from her cardiologist.  We will also need to inactivate the defibrillator during the procedure.  We will make arrangements for the above and  discussed these with the surgical team at the time of her preoperative clinic visit.  The risks and benefits of the procedure have been discussed at length with the patient.  The patient understands the proposed procedure, potential alternative treatments, and the course of recovery to be expected.  All of the patient's questions have been answered at this time.  The patient wishes to proceed with surgery.  Armandina Gemma, MD [facility] Office: [phone]

## 2021-02-03 ENCOUNTER — Encounter (HOSPITAL_COMMUNITY): Payer: Self-pay | Admitting: Surgery

## 2021-02-03 DIAGNOSIS — E210 Primary hyperparathyroidism: Secondary | ICD-10-CM | POA: Diagnosis not present

## 2021-02-03 LAB — BASIC METABOLIC PANEL
Anion gap: 6 (ref 5–15)
BUN: 18 mg/dL (ref 8–23)
CO2: 27 mmol/L (ref 22–32)
Calcium: 9.5 mg/dL (ref 8.9–10.3)
Chloride: 94 mmol/L — ABNORMAL LOW (ref 98–111)
Creatinine, Ser: 0.86 mg/dL (ref 0.44–1.00)
GFR, Estimated: >60 mL/min (ref >60)
Glucose, Bld: 130 mg/dL — ABNORMAL HIGH (ref 70–99)
Potassium: 4.3 mmol/L (ref 3.5–5.1)
Sodium: 127 mmol/L — ABNORMAL LOW (ref 135–145)

## 2021-02-03 LAB — SURGICAL PATHOLOGY

## 2021-02-03 MED ORDER — TRAMADOL HCL 50 MG PO TABS: 50.0000 mg | ORAL_TABLET | Freq: Four times a day (QID) | ORAL | 0 refills | Status: DC | PRN

## 2021-02-03 NOTE — Progress Notes (Signed)
Discharge instructions discussed with patient and family, verbalized agreement and understanding 

## 2021-02-03 NOTE — Discharge Summary (Signed)
Physician Discharge Summary [facility]  Patient ID: Pam [name] MRN: [id number] DOB/AGE: 1950-10-09 70 y.o.  Admit date: 02/02/2021  Discharge date: 02/03/2021  Discharge Diagnoses:  Principal Problem:   Hyperparathyroidism, primary [facility]) Active Problems:   Primary hyperparathyroidism [facility])   Discharged Condition: good  Hospital Course: Patient was admitted for observation following parathyroid surgery.  Post op course was uncomplicated.  Pain was well controlled.  Tolerated diet.  Post op calcium level on morning following surgery was 9.5 mg/dl.  Patient was prepared for discharge home on POD#1.   Consults: None  Treatments: surgery: minimally invasive parathyroidectomy  Discharge Exam: Blood pressure (!) 146/62, pulse 67, temperature 98 F (36.7 C), resp. rate 18, height 5\' 3"  (1.6 m), weight 54.9 kg, SpO2 100 %. HEENT - clear Neck - wound dry and intact; mild ecchymosis; voice normal Chest - clear bilaterally Cor - RRR   Disposition: Home  Discharge Instructions     Diet - low sodium heart healthy   Complete by: As directed    Increase activity slowly   Complete by: As directed    No dressing needed   Complete by: As directed       Allergies as of 02/03/2021       Reactions   Lisinopril Cough   Dry cough   Spironolactone Other (See Comments)   Low sodium        Medication List     TAKE these medications    acetaminophen 500 MG tablet Commonly known as: TYLENOL Take 1,000 mg by mouth every 6 (six) hours as needed for moderate pain.   clopidogrel 75 MG tablet Commonly known as: PLAVIX TAKE 1 TABLET(75 MG) BY MOUTH DAILY WITH BREAKFAST What changed:  how much to take how to take this when to take this additional instructions   furosemide 20 MG tablet Commonly known as: LASIX TAKE 1 [place], [place], [place]. What changed:  how much to take how to take this when to take this additional  instructions   levothyroxine 25 MCG tablet Commonly known as: SYNTHROID Take 37.5 mcg by mouth daily before breakfast.   losartan 100 MG tablet Commonly known as: COZAAR TAKE 1 TABLET(100 MG TOTAL) BEFORE Y MOUTH DAILY. What changed: See the new instructions.   nitroGLYCERIN 0.4 MG SL tablet Commonly known as: NITROSTAT Place 1 tablet (0.4 mg total) under the tongue every 5 (five) minutes x 3 doses as needed for chest pain.   ranolazine 1000 MG SR tablet Commonly known as: RANEXA Take 1 tablet (1,000 mg total) by mouth 2 (two) times daily.   traMADol 50 MG tablet Commonly known as: ULTRAM Take 1-2 tablets (50-100 mg total) by mouth every 6 (six) hours as needed for moderate pain.       ASK your doctor about these medications    alendronate 70 MG tablet Commonly known as: FOSAMAX TAKE 1 TABLET(70 MG) BY MOUTH 1 TIME A WEEK WITH A FULL GLASS OF WATER AND ON AN EMPTY STOMACH   atorvastatin 80 MG tablet Commonly known as: LIPITOR TAKE 1 TABLET(80 MG) BY MOUTH DAILY   carvedilol 6.25 MG tablet Commonly known as: COREG TAKE 1 TABLET(6.25 MG) BY MOUTH TWICE DAILY.               Discharge Care Instructions  (From admission, onward)           Start     Ordered   02/03/21 0000  No  dressing needed        02/03/21 2336            Follow-up Information     Armandina Gemma, MD. Schedule an appointment as soon as possible for a visit in 3 week(s).   Specialty: General Surgery Why: For wound re-check Contact information: [place] [address] [phone]                 Armandina Gemma, [facility] Office: [phone]   Signed: Armandina Gemma 02/03/2021, 7:51 AM

## 2021-02-03 NOTE — Discharge Instructions (Signed)
[facility]  THYROID & PARATHYROID SURGERY:  POST-OP INSTRUCTIONS  Always review your discharge instruction sheet from the facility where your surgery was performed.  A prescription for pain medication may be given to you upon discharge.  Take your pain medication as prescribed.  If narcotic pain medicine is not needed, then you may take acetaminophen (Tylenol) or ibuprofen (Advil) as needed.  Take your usually prescribed medications unless otherwise directed.  If you need a refill on your pain medication, please contact our office during regular business hours.  Prescriptions cannot be processed by our office after 5 pm or on weekends.  Start with a light diet upon arrival home, such as soup and crackers or toast.  Be sure to drink plenty of fluids daily.  Resume your normal diet the day after surgery.  Most patients will experience some swelling and bruising on the chest and neck area.  Ice packs will help.  Swelling and bruising can take several days to resolve.   It is common to experience some constipation after surgery.  Increasing fluid intake and taking a stool softener (Colace) will usually help or prevent this problem.  A mild laxative (Milk of Magnesia or Miralax) should be taken according to package directions if there has been no bowel movement after 48 hours.  You have steri-strips and a gauze dressing over your incision.  You may remove the gauze bandage on the second day after surgery, and you may shower at that time.  Leave your steri-strips (small skin tapes) in place directly over the incision.  These strips should remain on the skin for 5-7 days and then be removed.  You may get them wet in the shower and pat them dry.  You may resume regular (light) daily activities beginning the next day (such as daily self-care, walking, climbing stairs) gradually increasing activities as tolerated.  You may have sexual intercourse when it is comfortable.  Refrain from  any heavy lifting or straining until approved by your doctor.  You may drive when you no longer are taking prescription pain medication, you can comfortably wear a seatbelt, and you can safely maneuver your car and apply brakes.  You should see your doctor in the office for a follow-up appointment approximately three weeks after your surgery.  Make sure that you call for this appointment within a day or two after you arrive home to insure a convenient appointment time.  WHEN TO CALL YOUR DOCTOR: -- Fever greater than 101.5 -- Inability to urinate -- Nausea and/or vomiting - persistent -- Extreme swelling or bruising -- Continued bleeding from incision -- Increased pain, redness, or drainage from the incision -- Difficulty swallowing or breathing -- Muscle cramping or spasms -- Numbness or tingling in hands or around lips  The clinic staff is available to answer your questions during regular business hours.  Please don't hesitate to call and ask to speak to one of the nurses if you have concerns.  Armandina Gemma, MD [facility] Office: [facility]  THYROID & PARATHYROID SURGERY:  POST-OP INSTRUCTIONS  Always review your discharge instruction sheet from the facility where your surgery was performed.  A prescription for pain medication may be given to you upon discharge.  Take your pain medication as prescribed.  If narcotic pain medicine is not needed, then you may take acetaminophen (Tylenol) or ibuprofen (Advil) as needed.  Take your usually prescribed medications unless otherwise directed.  If you need a refill  on your pain medication, please contact our office during regular business hours.  Prescriptions cannot be processed by our office after 5 pm or on weekends.  Start with a light diet upon arrival home, such as soup and crackers or toast.  Be sure to drink plenty of fluids daily.  Resume your normal diet the day after  surgery.  Most patients will experience some swelling and bruising on the chest and neck area.  Ice packs will help.  Swelling and bruising can take several days to resolve.   It is common to experience some constipation after surgery.  Increasing fluid intake and taking a stool softener (Colace) will usually help or prevent this problem.  A mild laxative (Milk of Magnesia or Miralax) should be taken according to package directions if there has been no bowel movement after 48 hours.  You have steri-strips and a gauze dressing over your incision.  You may remove the gauze bandage on the second day after surgery, and you may shower at that time.  Leave your steri-strips (small skin tapes) in place directly over the incision.  These strips should remain on the skin for 5-7 days and then be removed.  You may get them wet in the shower and pat them dry.  You may resume regular (light) daily activities beginning the next day (such as daily self-care, walking, climbing stairs) gradually increasing activities as tolerated.  You may have sexual intercourse when it is comfortable.  Refrain from any heavy lifting or straining until approved by your doctor.  You may drive when you no longer are taking prescription pain medication, you can comfortably wear a seatbelt, and you can safely maneuver your car and apply brakes.  You should see your doctor in the office for a follow-up appointment approximately three weeks after your surgery.  Make sure that you call for this appointment within a day or two after you arrive home to insure a convenient appointment time.  WHEN TO CALL YOUR DOCTOR: -- Fever greater than 101.5 -- Inability to urinate -- Nausea and/or vomiting - persistent -- Extreme swelling or bruising -- Continued bleeding from incision -- Increased pain, redness, or drainage from the incision -- Difficulty swallowing or breathing -- Muscle cramping or spasms -- Numbness or tingling in hands or  around lips  The clinic staff is available to answer your questions during regular business hours.  Please don't hesitate to call and ask to speak to one of the nurses if you have concerns.  Armandina Gemma, MD [facility] Office: [phone]

## 2021-02-03 NOTE — Plan of Care (Signed)
  Problem: Coping: Goal: Level of anxiety will decrease Outcome: Progressing   Problem: Pain Managment: Goal: General experience of comfort will improve Outcome: Progressing   

## 2021-02-23 ENCOUNTER — Ambulatory Visit (INDEPENDENT_AMBULATORY_CARE_PROVIDER_SITE_OTHER): Payer: Federal, State, Local not specified - PPO

## 2021-02-23 DIAGNOSIS — I255 Ischemic cardiomyopathy: Secondary | ICD-10-CM

## 2021-02-23 LAB — CUP PACEART REMOTE DEVICE CHECK
Battery Remaining Longevity: 129 mo
Battery Voltage: 3.03 V
Brady Statistic RV Percent Paced: 0.02 %
Date Time Interrogation Session: 20220726001802
HighPow Impedance: 61 Ohm
Implantable Lead Implant Date: 20210127
Implantable Lead Location: 753860
Implantable Pulse Generator Implant Date: 20210127
Lead Channel Impedance Value: 304 Ohm
Lead Channel Impedance Value: 380 Ohm
Lead Channel Pacing Threshold Amplitude: 0.625 V
Lead Channel Pacing Threshold Pulse Width: 0.4 ms
Lead Channel Sensing Intrinsic Amplitude: 7.75 mV
Lead Channel Sensing Intrinsic Amplitude: 7.75 mV
Lead Channel Setting Pacing Amplitude: 2 V
Lead Channel Setting Pacing Pulse Width: 0.4 ms
Lead Channel Setting Sensing Sensitivity: 0.3 mV

## 2021-03-15 ENCOUNTER — Other Ambulatory Visit: Payer: Self-pay

## 2021-03-15 ENCOUNTER — Other Ambulatory Visit (INDEPENDENT_AMBULATORY_CARE_PROVIDER_SITE_OTHER): Payer: Federal, State, Local not specified - PPO

## 2021-03-15 DIAGNOSIS — E871 Hypo-osmolality and hyponatremia: Secondary | ICD-10-CM

## 2021-03-15 LAB — BASIC METABOLIC PANEL
BUN: 9 mg/dL (ref 6–23)
CO2: 28 mEq/L (ref 19–32)
Calcium: 9.6 mg/dL (ref 8.4–10.5)
Chloride: 99 mEq/L (ref 96–112)
Creatinine, Ser: 0.95 mg/dL (ref 0.40–1.20)
GFR: 61.07 mL/min (ref >60.00)
Glucose, Bld: 94 mg/dL (ref 70–99)
Potassium: 4.6 mEq/L (ref 3.5–5.1)
Sodium: 133 mEq/L — ABNORMAL LOW (ref 135–145)

## 2021-03-18 ENCOUNTER — Ambulatory Visit: Payer: Federal, State, Local not specified - PPO | Admitting: Endocrinology

## 2021-03-18 ENCOUNTER — Encounter: Payer: Self-pay | Admitting: Endocrinology

## 2021-03-18 ENCOUNTER — Other Ambulatory Visit: Payer: Self-pay

## 2021-03-18 VITALS — BP 116/60 | HR 92 | Ht 63.0 in | Wt 119.0 lb

## 2021-03-18 DIAGNOSIS — Z8639 Personal history of other endocrine, nutritional and metabolic disease: Secondary | ICD-10-CM | POA: Diagnosis not present

## 2021-03-18 DIAGNOSIS — E039 Hypothyroidism, unspecified: Secondary | ICD-10-CM

## 2021-03-18 DIAGNOSIS — M810 Age-related osteoporosis without current pathological fracture: Secondary | ICD-10-CM

## 2021-03-18 DIAGNOSIS — E871 Hypo-osmolality and hyponatremia: Secondary | ICD-10-CM | POA: Diagnosis not present

## 2021-03-18 NOTE — Progress Notes (Signed)
Patient ID: Pam [name], female   DOB: 27-Mar-1951, 70 y.o.   MRN: [id number]                  Chief complaint: Follow-up of low sodium and other questions  History of Present Illness:    HYPERCALCEMIA:  She is here in follow-up of her parathyroidectomy which was done on 02/02/2021  Background history  Review of records show that she has had a high calcium since 05/10/2018 when her calcium was 10.6. Previously highest calcium was 10.2 done in 2018 She had been referred here because of the finding of a PTH of 107 done in 05/2019  Since her calcium was significantly higher at 12.2 she was referred for parathyroidectomy but this was not done until last month  Calcium has been back to normal on the last 2 measurements She feels overall better  She was told to leave off her vitamin D supplement previously   Lab Results  Component Value Date   CALCIUM 9.6 03/15/2021   CALCIUM 9.5 02/03/2021   CALCIUM 11.1 (H) 01/25/2021   CALCIUM 12.2 (H) 11/18/2020   CALCIUM 11.7 (H) 10/13/2020   CALCIUM 11.9 (H) 09/21/2020   CALCIUM 11.1 (H) 03/16/2020   CALCIUM 11.0 (H) 01/14/2020   CALCIUM 11.0 (H) 10/02/2019    No history of low trauma fractures, renal insufficiency, nephrolithiasis, sarcoidosis, known carcinoma  OSTEOPOROSIS:  Screening bone density showed osteoporosis at the hip and radius as of 07/2019  Since then she has been on alendronate 70 mg weekly  She has been regular with taking this on empty stomach every week with a glass of water  Currently off vitamin D   Lumbar spine L1-L3 (L4) Femoral neck (FN) 33% distal radius Ultra distal radius  T-score -1.0 RFN: -2.8 LFN: -2.4  -1.6  -2.7     25 (OH) Vitamin D level as follows:  Lab Results  Component Value Date   VD25OH 56.50 09/21/2020   VD25OH 31.81 10/02/2019   HYPONATREMIA: See review of systems   Allergies as of 03/18/2021       Reactions   Lisinopril Cough   Dry cough   Spironolactone Other (See  Comments)   Low sodium        Medication List        Accurate as of March 18, 2021 11:26 AM. If you have any questions, ask your nurse or doctor.          acetaminophen 500 MG tablet Commonly known as: TYLENOL Take 1,000 mg by mouth every 6 (six) hours as needed for moderate pain.   alendronate 70 MG tablet Commonly known as: FOSAMAX TAKE 1 TABLET(70 MG) BY MOUTH 1 TIME A WEEK WITH A FULL GLASS OF WATER AND ON AN EMPTY STOMACH What changed: See the new instructions.   atorvastatin 80 MG tablet Commonly known as: LIPITOR TAKE 1 TABLET(80 MG) BY MOUTH DAILY What changed: See the new instructions.   carvedilol 6.25 MG tablet Commonly known as: COREG TAKE 1 TABLET(6.25 MG) BY MOUTH TWICE DAILY. What changed:  how much to take how to take this when to take this additional instructions   clopidogrel 75 MG tablet Commonly known as: PLAVIX TAKE 1 TABLET(75 MG) BY MOUTH DAILY WITH BREAKFAST   furosemide 20 MG tablet Commonly known as: LASIX TAKE 1 TABLET BY MOUTH EVERY MONDAY, [place], AND FRIDAY.   levothyroxine 25 MCG tablet Commonly known as: SYNTHROID Take 37.5 mcg by mouth daily before breakfast.   losartan 100  MG tablet Commonly known as: COZAAR TAKE 1 TABLET(100 MG TOTAL) BEFORE Y MOUTH DAILY.   nitroGLYCERIN 0.4 MG SL tablet Commonly known as: NITROSTAT Place 1 tablet (0.4 mg total) under the tongue every 5 (five) minutes x 3 doses as needed for chest pain.   ranolazine 1000 MG SR tablet Commonly known as: RANEXA Take 1 tablet (1,000 mg total) by mouth 2 (two) times daily.   traMADol 50 MG tablet Commonly known as: ULTRAM Take 1-2 tablets (50-100 mg total) by mouth every 6 (six) hours as needed for moderate pain.        Allergies:  Allergies  Allergen Reactions   Lisinopril Cough    Dry cough   Spironolactone Other (See Comments)    Low sodium    Past Medical History:  Diagnosis Date   Abnormal EKG    AICD (automatic  cardioverter/defibrillator) present    Alcoholic intoxication without complication (HCC)    Allergy    Cardiomyopathy, ischemic    Cataract    Chest pain 08/29/2016   CHF (congestive heart failure) ([place])    Chronic systolic heart failure ([place]) 08/29/2016   Facial laceration    Fall 07/07/2016   Hyperlipidemia    Hypertension 03/03/2017   Hyponatremia    Hypothyroidism 07/08/2016   NSTEMI (non-ST elevated myocardial infarction) [facility])    Syncope    Thyroid disease     Past Surgical History:  Procedure Laterality Date   CARDIAC CATHETERIZATION N/A 07/08/2016   Procedure: Left Heart Cath and Coronary Angiography;  Surgeon: [name], MD;  Location: [place] [facility];  Service: Cardiovascular;  Laterality: N/A;   CARDIAC CATHETERIZATION N/A 08/29/2016   Procedure: Coronary Atherectomy;  Surgeon: [name], MD;  Location: [place] [facility];  Service: Cardiovascular;  Laterality: N/A;   CARDIAC CATHETERIZATION N/A 08/29/2016   Procedure: Coronary Stent Intervention;  Surgeon: [name], MD;  Location: [place] [facility];  Service: Cardiovascular;  Laterality: N/A;   CATARACT EXTRACTION, BILATERAL     CORONARY ANGIOPLASTY     coronary stents      ICD IMPLANT N/A 08/28/2019   Procedure: ICD IMPLANT;  Surgeon: Constance Haw, MD;  Location: [place] [facility];  Service: Cardiovascular;  Laterality: N/A;   PARATHYROIDECTOMY Right 02/02/2021   Procedure: RIGHT SUPERIOR PARATHYROIDECTOMY;  Surgeon: Armandina Gemma, MD;  Location: [facility];  Service: General;  Laterality: Right;   VAGINAL HYSTERECTOMY      Family History  Problem Relation Age of Onset   Kidney failure Mother    Heart disease Mother        CHF   Heart disease Sister    Colon cancer Neg Hx    Esophageal cancer Neg Hx    Rectal cancer Neg Hx    Stomach cancer Neg Hx     Social History:  reports that she quit smoking about 4 years ago. Her smoking use included cigarettes. She has a 50.00 pack-year  smoking history. She has never used smokeless tobacco. She reports that she does not currently use alcohol. She reports that she does not use drugs.  Review of Systems  She has mild hypothyroidism, apparently asymptomatic She has been prescribed levothyroxine 37.5 mcg daily by her PCP  Lab Results  Component Value Date   TSH 2.97 11/18/2020     HYPONATREMIA: She has had long-term hyponatremia of unclear etiology, not on thiazide diuretics except Lasix Also not on any other drugs causing hyponatremia Previously urine osmolality was relatively high at  332 and urine sodium last 39  Her sodium has been as low as 127 Previously she was told to start restricting her fluids and cut back drinking fluids throughout the day including water, coffee, juices and occasional diet drinks She states she is overall drinking less water although her intake is not consistently restricted Sodium is improving at 133  She has had upper normal potassium levels at times  Wt Readings from Last 3 Encounters:  03/18/21 119 lb (54 kg)  02/02/21 121 lb 0.5 oz (54.9 kg)  01/25/21 121 lb (54.9 kg)   Lab Results  Component Value Date   NA 133 (L) 03/15/2021   K 4.6 03/15/2021   CL 99 03/15/2021   CO2 28 03/15/2021     EXAM:  BP 116/60   Pulse 92   Ht '5\' 3"'$  (1.6 m)   Wt 119 lb (54 kg)   SpO2 95%   BMI 21.08 kg/m     Assessment/Plan:   HYPERCALCEMIA:  Resolved with parathyroidectomy and postop levels have been normal now   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   Elayne Snare 03/18/2021, 11:26 AM   11/26/20:  Sestamibi scan was not definitive but CT scan shows right upper parathyroid lesion.  Will refer for surgery

## 2021-03-22 NOTE — Progress Notes (Signed)
Remote ICD transmission.   

## 2021-03-23 NOTE — Addendum Note (Signed)
Addended by: Douglass Rivers D on: 03/23/2021 01:24 PM   Modules accepted: Level of Service

## 2021-05-24 LAB — CUP PACEART REMOTE DEVICE CHECK
Battery Remaining Longevity: 127 mo
Battery Voltage: 3.02 V
Brady Statistic RV Percent Paced: 0.03 %
Date Time Interrogation Session: 20221024081933
HighPow Impedance: 65 Ohm
Implantable Lead Implant Date: 20210127
Implantable Lead Location: 753860
Implantable Pulse Generator Implant Date: 20210127
Lead Channel Impedance Value: 266 Ohm
Lead Channel Impedance Value: 342 Ohm
Lead Channel Pacing Threshold Amplitude: 0.75 V
Lead Channel Pacing Threshold Pulse Width: 0.4 ms
Lead Channel Sensing Intrinsic Amplitude: 7.625 mV
Lead Channel Sensing Intrinsic Amplitude: 7.625 mV
Lead Channel Setting Pacing Amplitude: 2 V
Lead Channel Setting Pacing Pulse Width: 0.4 ms
Lead Channel Setting Sensing Sensitivity: 0.3 mV

## 2021-05-25 ENCOUNTER — Ambulatory Visit (INDEPENDENT_AMBULATORY_CARE_PROVIDER_SITE_OTHER): Payer: Federal, State, Local not specified - PPO

## 2021-05-25 DIAGNOSIS — I255 Ischemic cardiomyopathy: Secondary | ICD-10-CM | POA: Diagnosis not present

## 2021-06-03 NOTE — Progress Notes (Signed)
Remote ICD transmission.   

## 2021-08-17 ENCOUNTER — Other Ambulatory Visit (INDEPENDENT_AMBULATORY_CARE_PROVIDER_SITE_OTHER): Payer: Federal, State, Local not specified - PPO

## 2021-08-17 ENCOUNTER — Other Ambulatory Visit: Payer: Self-pay

## 2021-08-17 DIAGNOSIS — E039 Hypothyroidism, unspecified: Secondary | ICD-10-CM

## 2021-08-17 DIAGNOSIS — E871 Hypo-osmolality and hyponatremia: Secondary | ICD-10-CM

## 2021-08-17 DIAGNOSIS — M810 Age-related osteoporosis without current pathological fracture: Secondary | ICD-10-CM

## 2021-08-17 LAB — BASIC METABOLIC PANEL
BUN: 16 mg/dL (ref 6–23)
CO2: 27 mEq/L (ref 19–32)
Calcium: 9.4 mg/dL (ref 8.4–10.5)
Chloride: 99 mEq/L (ref 96–112)
Creatinine, Ser: 0.9 mg/dL (ref 0.40–1.20)
GFR: 64.97 mL/min (ref >60.00)
Glucose, Bld: 95 mg/dL (ref 70–99)
Potassium: 4.4 mEq/L (ref 3.5–5.1)
Sodium: 133 mEq/L — ABNORMAL LOW (ref 135–145)

## 2021-08-17 LAB — VITAMIN D 25 HYDROXY (VIT D DEFICIENCY, FRACTURES): VITD: 25.77 ng/mL — ABNORMAL LOW (ref 30.00–100.00)

## 2021-08-17 LAB — T4, FREE: Free T4: 0.91 ng/dL (ref 0.60–1.60)

## 2021-08-17 LAB — TSH: TSH: 3.46 u[IU]/mL (ref 0.35–5.50)

## 2021-08-19 ENCOUNTER — Ambulatory Visit: Payer: Federal, State, Local not specified - PPO | Admitting: Endocrinology

## 2021-08-19 ENCOUNTER — Other Ambulatory Visit: Payer: Self-pay

## 2021-08-19 ENCOUNTER — Encounter: Payer: Self-pay | Admitting: Endocrinology

## 2021-08-19 VITALS — BP 132/64 | HR 61 | Ht 63.0 in | Wt 122.2 lb

## 2021-08-19 DIAGNOSIS — E871 Hypo-osmolality and hyponatremia: Secondary | ICD-10-CM | POA: Diagnosis not present

## 2021-08-19 DIAGNOSIS — E039 Hypothyroidism, unspecified: Secondary | ICD-10-CM | POA: Diagnosis not present

## 2021-08-19 DIAGNOSIS — E559 Vitamin D deficiency, unspecified: Secondary | ICD-10-CM

## 2021-08-19 DIAGNOSIS — M810 Age-related osteoporosis without current pathological fracture: Secondary | ICD-10-CM | POA: Diagnosis not present

## 2021-08-19 NOTE — Patient Instructions (Signed)
Take at least 2000 U of  D3 daily

## 2021-08-19 NOTE — Progress Notes (Signed)
Patient ID: Pam [name], female   DOB: Feb 14, 1951, 71 y.o.   MRN: [id number]                  Chief complaint: Follow-up of low sodium and osteoporosis  History of Present Illness:    History of hyperparathyroidism:  Parathyroidectomy was done on 02/02/2021  Background history  Review of records show that she has had a high calcium since 05/10/2018 when her calcium was 10.6. Previously highest calcium was 10.2 done in 2018 PTH 107  Since her calcium was significantly higher at 12.2 she was referred for parathyroidectomy  Calcium has been back to normal after surgery   Lab Results  Component Value Date   CALCIUM 9.4 08/17/2021   CALCIUM 9.6 03/15/2021   CALCIUM 9.5 02/03/2021   CALCIUM 11.1 (H) 01/25/2021   CALCIUM 12.2 (H) 11/18/2020   CALCIUM 11.7 (H) 10/13/2020   CALCIUM 11.9 (H) 09/21/2020   CALCIUM 11.1 (H) 03/16/2020   CALCIUM 11.0 (H) 01/14/2020    No history of low trauma fractures, renal insufficiency, nephrolithiasis, sarcoidosis, known carcinoma  OSTEOPOROSIS:  Screening bone density showed osteoporosis at the hip and radius as of 07/2019  Since then she has been on alendronate 70 mg weekly  She has been regular with taking this on empty stomach every week with a glass of water  Currently off vitamin D  Bone density results:   Lumbar spine L1-L3 (L4) Femoral neck (FN) 33% distal radius Ultra distal radius  T-score -1.0 RFN: -2.8 LFN: -2.4  -1.6  -2.7     25 (OH) Vitamin D level as follows:  Lab Results  Component Value Date   VD25OH 25.77 (L) 08/17/2021   VD25OH 56.50 09/21/2020   HYPONATREMIA: See review of systems   Allergies as of 08/19/2021       Reactions   Lisinopril Cough   Dry cough   Spironolactone Other (See Comments)   Low sodium        Medication List        Accurate as of August 19, 2021 10:36 AM. If you have any questions, ask your nurse or doctor.          acetaminophen 500 MG tablet Commonly known as:  TYLENOL Take 1,000 mg by mouth every 6 (six) hours as needed for moderate pain.   alendronate 70 MG tablet Commonly known as: FOSAMAX TAKE 1 TABLET(70 MG) BY MOUTH 1 TIME A WEEK WITH A FULL GLASS OF WATER AND ON AN EMPTY STOMACH What changed: See the new instructions.   atorvastatin 80 MG tablet Commonly known as: LIPITOR TAKE 1 TABLET(80 MG) BY MOUTH DAILY What changed: See the new instructions.   carvedilol 6.25 MG tablet Commonly known as: COREG TAKE 1 TABLET(6.25 MG) BY MOUTH TWICE DAILY. What changed:  how much to take how to take this when to take this additional instructions   clopidogrel 75 MG tablet Commonly known as: PLAVIX TAKE 1 TABLET(75 MG) BY MOUTH DAILY WITH BREAKFAST   furosemide 20 MG tablet Commonly known as: LASIX TAKE 1 TABLET BY MOUTH EVERY MONDAY, [place], AND FRIDAY.   levothyroxine 25 MCG tablet Commonly known as: SYNTHROID Take 37.5 mcg by mouth daily before breakfast.   losartan 100 MG tablet Commonly known as: COZAAR TAKE 1 TABLET(100 MG TOTAL) BEFORE Y MOUTH DAILY.   nitroGLYCERIN 0.4 MG SL tablet Commonly known as: NITROSTAT Place 1 tablet (0.4 mg total) under the tongue every 5 (five) minutes x 3 doses as needed for  chest pain.   ranolazine 1000 MG SR tablet Commonly known as: RANEXA Take 1 tablet (1,000 mg total) by mouth 2 (two) times daily.   traMADol 50 MG tablet Commonly known as: ULTRAM Take 1-2 tablets (50-100 mg total) by mouth every 6 (six) hours as needed for moderate pain.        Allergies:  Allergies  Allergen Reactions   Lisinopril Cough    Dry cough   Spironolactone Other (See Comments)    Low sodium    Past Medical History:  Diagnosis Date   Abnormal EKG    AICD (automatic cardioverter/defibrillator) present    Alcoholic intoxication without complication (HCC)    Allergy    Cardiomyopathy, ischemic    Cataract    Chest pain 08/29/2016   CHF (congestive heart failure) ([place])    Chronic systolic  heart failure ([place]) 08/29/2016   Facial laceration    Fall 07/07/2016   Hyperlipidemia    Hypertension 03/03/2017   Hyponatremia    Hypothyroidism 07/08/2016   NSTEMI (non-ST elevated myocardial infarction) [facility])    Syncope    Thyroid disease     Past Surgical History:  Procedure Laterality Date   CARDIAC CATHETERIZATION N/A 07/08/2016   Procedure: Left Heart Cath and Coronary Angiography;  Surgeon: [name], MD;  Location: [place] [facility];  Service: Cardiovascular;  Laterality: N/A;   CARDIAC CATHETERIZATION N/A 08/29/2016   Procedure: Coronary Atherectomy;  Surgeon: [name], MD;  Location: [place] [facility];  Service: Cardiovascular;  Laterality: N/A;   CARDIAC CATHETERIZATION N/A 08/29/2016   Procedure: Coronary Stent Intervention;  Surgeon: [name], MD;  Location: [place] [facility];  Service: Cardiovascular;  Laterality: N/A;   CATARACT EXTRACTION, BILATERAL     CORONARY ANGIOPLASTY     coronary stents      ICD IMPLANT N/A 08/28/2019   Procedure: ICD IMPLANT;  Surgeon: Constance Haw, MD;  Location: [place] [facility];  Service: Cardiovascular;  Laterality: N/A;   PARATHYROIDECTOMY Right 02/02/2021   Procedure: RIGHT SUPERIOR PARATHYROIDECTOMY;  Surgeon: Armandina Gemma, MD;  [facility];  Service: General;  Laterality: Right;   VAGINAL HYSTERECTOMY      Family History  Problem Relation Age of Onset   Kidney failure Mother    Heart disease Mother        CHF   Heart disease Sister    Colon cancer Neg Hx    Esophageal cancer Neg Hx    Rectal cancer Neg Hx    Stomach cancer Neg Hx     Social History:  reports that she quit smoking about 5 years ago. Her smoking use included cigarettes. She has a 50.00 pack-year smoking history. She has never used smokeless tobacco. She reports that she does not currently use alcohol. She reports that she does not use drugs.  Review of Systems  She has mild hypothyroidism, apparently asymptomatic She  has been prescribed levothyroxine 37.5 mcg daily by her PCP  Last TSH:  Lab Results  Component Value Date   TSH 3.46 08/17/2021     HYPONATREMIA: She has had long-term hyponatremia of unclear etiology, not on thiazide diuretics except Lasix Also not on any other drugs causing hyponatremia Previously urine osmolality was relatively high at 332 and urine sodium last 39  Her sodium has been as low as 127 Previously she was told to start restricting her fluids and cut back drinking fluids throughout the day including water, coffee, juices and occasional diet drinks Sodium is  stable at 133  She has had upper normal potassium levels at times  Wt Readings from Last 3 Encounters:  08/19/21 122 lb 3.2 oz (55.4 kg)  03/18/21 119 lb (54 kg)  02/02/21 121 lb 0.5 oz (54.9 kg)   Lab Results  Component Value Date   NA 133 (L) 08/17/2021   K 4.4 08/17/2021   CL 99 08/17/2021   CO2 27 08/17/2021     EXAM:  BP 132/64    Pulse 61    Ht 5\' 3"  (1.6 m)    Wt 122 lb 3.2 oz (55.4 kg)    SpO2 97%    BMI 21.65 kg/m     Assessment/Plan:   HYPERCALCEMIA:  Resolved with parathyroidectomy with consistently normal levels   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 with her relatively high osmolality and urine sodium Overall with reducing water intake her sodium is leveled off at 133 Needs to avoid excess fluids  OSTEOPOROSIS: She is being treated with Fosamax which she is taking regularly Currently has vitamin D deficiency and she can start back on 2000 units vitamin D3   Will reassess her bone density next summer which will be about a year after her parathyroid surgery If bone density is improved as expected she will continue Fosamax at least another 2 years before drug holiday  HYPOTHYROIDISM: Mild and adequately replaced with normal TSH  There are no Patient Instructions on file for this visit.   Elayne Snare 08/19/2021, 10:36 AM   11/26/20:

## 2021-08-24 ENCOUNTER — Ambulatory Visit (INDEPENDENT_AMBULATORY_CARE_PROVIDER_SITE_OTHER): Payer: Federal, State, Local not specified - PPO

## 2021-08-24 DIAGNOSIS — I255 Ischemic cardiomyopathy: Secondary | ICD-10-CM

## 2021-08-24 LAB — CUP PACEART REMOTE DEVICE CHECK
Battery Remaining Longevity: 125 mo
Battery Voltage: 3.02 V
Brady Statistic RV Percent Paced: 0.05 %
Date Time Interrogation Session: 20230124063427
HighPow Impedance: 61 Ohm
Implantable Lead Implant Date: 20210127
Implantable Lead Location: 753860
Implantable Pulse Generator Implant Date: 20210127
Lead Channel Impedance Value: 304 Ohm
Lead Channel Impedance Value: 342 Ohm
Lead Channel Pacing Threshold Amplitude: 0.625 V
Lead Channel Pacing Threshold Pulse Width: 0.4 ms
Lead Channel Sensing Intrinsic Amplitude: 7.625 mV
Lead Channel Sensing Intrinsic Amplitude: 7.625 mV
Lead Channel Setting Pacing Amplitude: 2 V
Lead Channel Setting Pacing Pulse Width: 0.4 ms
Lead Channel Setting Sensing Sensitivity: 0.3 mV

## 2021-08-26 ENCOUNTER — Ambulatory Visit: Payer: Federal, State, Local not specified - PPO | Admitting: Endocrinology

## 2021-08-29 ENCOUNTER — Other Ambulatory Visit: Payer: Self-pay | Admitting: Endocrinology

## 2021-09-03 NOTE — Progress Notes (Signed)
Remote ICD transmission.   

## 2021-09-06 ENCOUNTER — Ambulatory Visit: Payer: Federal, State, Local not specified - PPO | Admitting: Gastroenterology

## 2021-09-06 ENCOUNTER — Encounter: Payer: Self-pay | Admitting: Gastroenterology

## 2021-09-06 VITALS — BP 140/80 | HR 58 | Ht 63.0 in | Wt 123.6 lb

## 2021-09-06 DIAGNOSIS — Z86010 Personal history of colonic polyps: Secondary | ICD-10-CM | POA: Diagnosis not present

## 2021-09-06 DIAGNOSIS — R159 Full incontinence of feces: Secondary | ICD-10-CM | POA: Diagnosis not present

## 2021-09-06 DIAGNOSIS — R152 Fecal urgency: Secondary | ICD-10-CM

## 2021-09-06 DIAGNOSIS — Z7901 Long term (current) use of anticoagulants: Secondary | ICD-10-CM | POA: Diagnosis not present

## 2021-09-06 MED ORDER — NA SULFATE-K SULFATE-MG SULF 17.5-3.13-1.6 GM/177ML PO SOLN: 1.0000 | Freq: Once | ORAL | 0 refills | Status: AC

## 2021-09-06 NOTE — Progress Notes (Signed)
Referring Provider: Loraine Leriche.,* Primary Care Physician:  Loraine Leriche., MD   Reason for Consultation: Surveillance colonoscopy   IMPRESSION:  History of colon polyps.  3 tubular adenomas removed in 2019.  Surveillance recommended in 3 years.   On Plavix.  Plavix washout for 5 days recommended before colonoscopy.  I discussed with the patient that there is a low, but real, risk of a cardiovascular event such as heart attack, stroke, or embolism/thrombosis while off Plavix. Will communicate by EMR with patient's prescribing provider to confirm that holding the Plavix is appropriate at this time.   Fecal incontinence. Not interested in further evaluation or treatment at this time. Discussed adding a stool bulking agent, but, she is not interested in additional medicines given her regimen. She can call at any time of schedule anorectal manometry +/- PT for biofeedback therapy if she would like to proceed.  PLAN: - Colonoscopy off Plavix if approved by prescribing physician - Colonoscopy will be scheduled at the hospital given her underlying cardiac disease - Increase daily water and fiber intake, add daily psyllium for stool bulking - Anorectal manometry +/- PT for biofeedback therapy if she would like to proceed  I consented the patient at the bedside today discussing the risks, benefits, and alternatives to endoscopic evaluation. In particular, we discussed the risks that include, but are not limited to, reaction to medication, cardiopulmonary compromise, bleeding requiring blood transfusion, aspiration resulting in pneumonia, perforation requiring surgery, and even death. We reviewed the risk of missed lesion including polyps or even cancer. The patient acknowledges these risks and asks that we proceed.   HPI: Pam [name] is a 71 y.o. female seen for colon cancer screening surveillance. She has CAD s/p MI 2017, ischemic cardiomyopathy, chronic systolic heart failure,  hypertension, hypercholesterolemia, hyperlipidemia, hypertension, hypothyroidism, hyperparathyroidism, osteoporosis. On Plavix. EF 202 was 25-30%.  Colonoscopy 07/16/2018 showed a 5 mm descending polyp, a 2 mm descending polyp, and a 10 mm rectal polyp.  All 3 polyps were tubular adenomas. Surveillance recommended in 3 years. She did well with the prep, procedure, and recovery.  GI ROS is essentially negative except for rare constipation.  She also has poor control of defecation and wears a pad. She does not feel that treatment is necessary at this time and is very resistant to  further evaluation.   No known family history of colon cancer or polyps.    Past Medical History:  Diagnosis Date   Abnormal EKG    AICD (automatic cardioverter/defibrillator) present    Alcoholic intoxication without complication ([place])    Allergy    Cardiomyopathy, ischemic    Cataract    Chest pain 08/29/2016   CHF (congestive heart failure) ([place])    Chronic systolic heart failure ([place]) 08/29/2016   Facial laceration    Fall 07/07/2016   Hyperlipidemia    Hypertension 03/03/2017   Hyponatremia    Hypothyroidism 07/08/2016   NSTEMI (non-ST elevated myocardial infarction) [facility])    Syncope    Thyroid disease     Past Surgical History:  Procedure Laterality Date   CARDIAC CATHETERIZATION N/A 07/08/2016   Procedure: Left Heart Cath and Coronary Angiography;  Surgeon: [name], MD;  Location: [place] [facility];  Service: Cardiovascular;  Laterality: N/A;   CARDIAC CATHETERIZATION N/A 08/29/2016   Procedure: Coronary Atherectomy;  Surgeon: [name], MD;  Location: [place] [facility];  Service: Cardiovascular;  Laterality: N/A;   CARDIAC CATHETERIZATION N/A 08/29/2016   Procedure: Coronary Stent Intervention;  Surgeon: [name], MD;  Location: [place] [facility];  Service: Cardiovascular;  Laterality: N/A;   CATARACT EXTRACTION, BILATERAL     CORONARY ANGIOPLASTY     coronary stents       ICD IMPLANT N/A 08/28/2019   Procedure: ICD IMPLANT;  Surgeon: Constance Haw, MD;  Location: [place] [facility];  Service: Cardiovascular;  Laterality: N/A;   PARATHYROIDECTOMY Right 02/02/2021   Procedure: RIGHT SUPERIOR PARATHYROIDECTOMY;  Surgeon: Armandina Gemma, MD;  [facility];  Service: General;  Laterality: Right;   VAGINAL HYSTERECTOMY      Current Outpatient Medications  Medication Sig Dispense Refill   acetaminophen (TYLENOL) 500 MG tablet Take 1,000 mg by mouth every 6 (six) hours as needed for moderate pain.     alendronate (FOSAMAX) 70 MG tablet TAKE 1 TABLET(70 MG) BY MOUTH 1 TIME A WEEK WITH A FULL GLASS OF WATER AND ON AN EMPTY STOMACH 12 tablet 1   atorvastatin (LIPITOR) 80 MG tablet TAKE 1 TABLET(80 MG) BY MOUTH DAILY (Patient taking differently: Take 80 mg by mouth daily.) 90 tablet 1   carvedilol (COREG) 6.25 MG tablet TAKE 1 TABLET(6.25 MG) BY MOUTH TWICE DAILY. (Patient taking differently: Take 6.25 mg by mouth in the morning and at bedtime.) 180 tablet 3   Cholecalciferol (VITAMIN D3) 50 MCG (2000 UT) TABS Take 2,000 mcg by mouth daily.     clopidogrel (PLAVIX) 75 MG tablet TAKE 1 TABLET(75 MG) BY MOUTH DAILY WITH BREAKFAST 90 tablet 3   furosemide (LASIX) 20 MG tablet TAKE 1 TABLET BY MOUTH EVERY MONDAY, WEDNESDAY, AND FRIDAY. 45 tablet 3   levothyroxine (SYNTHROID) 25 MCG tablet Take 37.5 mcg by mouth daily before breakfast.     losartan (COZAAR) 100 MG tablet TAKE 1 TABLET(100 MG TOTAL) BEFORE Y MOUTH DAILY. 90 tablet 2   nitroGLYCERIN (NITROSTAT) 0.4 MG SL tablet Place 1 tablet (0.4 mg total) under the tongue every 5 (five) minutes x 3 doses as needed for chest pain. 75 tablet 1   ranolazine (RANEXA) 1000 MG SR tablet Take 1 tablet (1,000 mg total) by mouth 2 (two) times daily. 180 tablet 3   valACYclovir (VALTREX) 1000 MG tablet Take 1,000 mg by mouth daily.     No current facility-administered medications for this visit.    Allergies as of 09/06/2021  - Review Complete 09/06/2021  Allergen Reaction Noted   Lisinopril Cough 03/03/2017   Spironolactone Other (See Comments) 03/07/2017    Family History  Problem Relation Age of Onset   Kidney failure Mother    Heart disease Mother        CHF   Heart disease Sister    Colon cancer Neg Hx    Esophageal cancer Neg Hx    Rectal cancer Neg Hx    Stomach cancer Neg Hx     Social History   Socioeconomic History   Marital status: Married    Spouse name: Not on file   Number of children: 2   Years of education: Not on file   Highest education level: Not on file  Occupational History   Occupation: Retired-worked for post office  Tobacco Use   Smoking status: Former    Packs/day: 1.00    Years: 50.00    Pack years: 50.00    Types: Cigarettes    Quit date: 07/07/2016    Years since quitting: 5.1   Smokeless tobacco: Never  Vaping Use   Vaping Use: Never used  Substance and Sexual Activity  Alcohol use: Not Currently   Drug use: Never   Sexual activity: Not Currently  Other Topics Concern   Not on file  Social History Narrative   Not on file   Social Determinants of Health   Financial Resource Strain: Not on file  Food Insecurity: Not on file  Transportation Needs: Not on file  Physical Activity: Not on file  Stress: Not on file  Social Connections: Not on file  Intimate Partner Violence: Not on file      Physical Exam: Vital signs were reviewed. General:   Alert, well-nourished, pleasant and cooperative in NAD Head:  Normocephalic and atraumatic. Eyes:  Sclera clear, no icterus.   Conjunctiva pink. Mouth:  No deformity or lesions.   Neck:  Supple; no thyromegaly. Lungs:  Clear throughout to auscultation.   No wheezes. Heart:  Regular rate and rhythm; no murmurs, defibrillator present Abdomen:  Soft, nontender, normal bowel sounds. No rebound or guarding. No hepatosplenomegaly Rectal:  Deferred  Msk:  Symmetrical without gross deformities. Extremities:  No  gross deformities or edema. Neurologic:  Alert and  oriented x4;  grossly nonfocal Skin:  No rash or bruise. Psych:  Alert and cooperative. Normal mood and affect.   Zaleigh Bermingham L. Tarri Glenn Md, MPH [facility] 09/06/2021, 10:10 AM

## 2021-09-06 NOTE — Patient Instructions (Addendum)
It was my pleasure to provide care to you today. Based on our discussion, I am providing you with my recommendations below:  RECOMMENDATION(S):   I recommend that you add a daily dose of Metamucil or Benefiber.  Further evaluation with anorectal manometry and pelvic floor physical therapy may help improve your leakage.   COLONOSCOPY:   You have been scheduled for a colonoscopy. Please follow written instructions given to you at your visit today.   PREP:   Please pick up your prep supplies at the pharmacy within the next 1-3 days.  INHALERS:   If you use inhalers (even only as needed), please bring them with you on the day of your procedure.  MEDICATIONS TO HOLD:  We will contact your provider to request permission for you to hold Plavix. Once we receive a response, you will be contacted by our office. If you do not hear from our office 1 week prior to your scheduled procedure, please contact our office at [phone].   COLONOSCOPY TIPS:  To reduce nausea and dehydration, stay well hydrated for 3-4 days prior to the exam.  To prevent skin/hemorrhoid irritation - prior to wiping, put A&Dointment or vaseline on the toilet paper. Keep a towel or pad on the bed.  BEFORE STARTING YOUR PREP, drink  64oz of clear liquids in the morning. This will help to flush the colon and will ensure you are well hydrated!!!!  NOTE - This is in addition to the fluids required for to complete your prep. Use of a flavored hard candy, such as grape Anise Salvo, can counteract some of the flavor of the prep and may prevent some nausea.    FOLLOW UP:  After your procedure, you will receive a call from my office staff regarding my recommendation for follow up.  BMI:  If you are age 70 or older, your body mass index should be between 23-30. Your Body mass index is 21.89 kg/m. If this is out of the aforementioned range listed, please consider follow up with your Primary Care Provider.  If you are  age 75 or younger, your body mass index should be between 19-25. Your Body mass index is 21.89 kg/m. If this is out of the aformentioned range listed, please consider follow up with your Primary Care Provider.   MY CHART:  The [facility] providers would like to encourage you to use [facility] to communicate with providers for non-urgent requests or questions.  Due to long hold times on the telephone, sending your provider a message by [facility] may be a faster and more efficient way to get a response.  Please allow 48 business hours for a response.  Please remember that this is for non-urgent requests.   Thank you for trusting me with your gastrointestinal care!    Thornton Park, MD, MPH

## 2021-09-07 ENCOUNTER — Telehealth: Payer: Self-pay | Admitting: *Deleted

## 2021-09-07 NOTE — Telephone Encounter (Signed)
[facility] Pre-operative Risk Assessment     Request for surgical clearance:     Endoscopy Procedure  What type of surgery is being performed?     Colonoscopy  When is this surgery scheduled?     TBD  What type of clearance is required ?   Pharmacy  Are there any medications that need to be held prior to surgery and how long? Plavix 5 days  Practice name and name of physician performing surgery?      [facility]  What is your office phone and fax number?      Phone- [phone]  [phone]  Anesthesia type (None, local, MAC, general) ?       MAC

## 2021-09-08 ENCOUNTER — Other Ambulatory Visit: Payer: Self-pay

## 2021-09-08 DIAGNOSIS — Z7901 Long term (current) use of anticoagulants: Secondary | ICD-10-CM

## 2021-09-08 DIAGNOSIS — R152 Fecal urgency: Secondary | ICD-10-CM

## 2021-09-08 DIAGNOSIS — R159 Full incontinence of feces: Secondary | ICD-10-CM

## 2021-09-08 DIAGNOSIS — Z860100 Personal history of colon polyps, unspecified: Secondary | ICD-10-CM

## 2021-09-08 NOTE — Telephone Encounter (Signed)
° °  Name: Pam [name]  DOB: 08-11-1950  MRN: [id number]   Primary Cardiologist: Lauree Chandler, MD  Chart reviewed as part of pre-operative protocol coverage. Patient was contacted 09/08/2021 in reference to pre-operative risk assessment for pending surgery as outlined below.  Pam [name] was last seen 11/2020 by Dr. Angelena Form, history reviewed.I reached out to patient for update on how she is doing. The patient affirms she has been doing well without any new cardiac symptoms. Therefore, based on ACC/AHA guidelines, the patient would be at acceptable risk for the planned procedure without further cardiovascular testing. The patient was advised that if she develops new symptoms prior to surgery to contact our office to arrange for a follow-up visit, and she verbalized understanding.  She was previously granted to hold Plavix for 5 days prior to an unrelated procedure in 12/2020. Given no interim clinical changes, the same recommendation can be applied here.  I will route this recommendation to the requesting party via Epic fax function and remove from pre-op pool. Please call with questions.  Charlie Pitter, PA-C 09/08/2021, 12:39 PM

## 2021-09-08 NOTE — Telephone Encounter (Signed)
Called pt and informed about Dr. Camillia Herter response below. Advised this was included in prep instructions as well. Verbalized acceptance and understanding.

## 2021-09-08 NOTE — Telephone Encounter (Signed)
Called pt and advised she has been scheduled for hospital procedure on 11/18/21 @ 12pm, arrival time 1030am. Prep instructions sent via My Chart per pt request. Amb referral placed for auth purposes.

## 2021-10-12 ENCOUNTER — Encounter: Payer: Federal, State, Local not specified - PPO | Admitting: Gastroenterology

## 2021-11-08 ENCOUNTER — Telehealth: Payer: Self-pay | Admitting: Gastroenterology

## 2021-11-08 NOTE — Telephone Encounter (Signed)
Inbound call from patient would like a call back to discuss prep for upcoming procedure 4/20 at [facility]. States she received 2 instructions and need clarification ?

## 2021-11-08 NOTE — Telephone Encounter (Signed)
Addressed in a previously created encounter 

## 2021-11-11 ENCOUNTER — Encounter (HOSPITAL_COMMUNITY): Payer: Self-pay | Admitting: Gastroenterology

## 2021-11-16 ENCOUNTER — Ambulatory Visit (INDEPENDENT_AMBULATORY_CARE_PROVIDER_SITE_OTHER): Payer: Federal, State, Local not specified - PPO | Admitting: Cardiology

## 2021-11-16 ENCOUNTER — Encounter: Payer: Self-pay | Admitting: Cardiology

## 2021-11-16 VITALS — BP 140/70 | HR 65 | Ht 63.0 in | Wt 121.0 lb

## 2021-11-16 DIAGNOSIS — I5022 Chronic systolic (congestive) heart failure: Secondary | ICD-10-CM

## 2021-11-16 MED ORDER — NITROGLYCERIN 0.4 MG SL SUBL: 0.4000 mg | SUBLINGUAL_TABLET | SUBLINGUAL | 3 refills | Status: DC | PRN

## 2021-11-16 NOTE — Patient Instructions (Signed)
Medication Instructions:  ?Your physician recommends that you continue on your current medications as directed. Please refer to the Current Medication list given to you today. ? ?*If you need a refill on your cardiac medications before your next appointment, please call your pharmacy* ? ? ?Lab Work: ?None ordered ? ? ?Testing/Procedures: ?None ordered ? ? ?Follow-Up: ?At [facility], you and your health needs are our priority.  As part of our continuing mission to provide you with exceptional heart care, we have created designated Provider Care Teams.  These Care Teams include your primary Cardiologist (physician) and Advanced Practice Providers (APPs -  Physician Assistants and Nurse Practitioners) who all work together to provide you with the care you need, when you need it. ? ?Remote monitoring is used to monitor your Pacemaker or ICD from home. This monitoring reduces the number of office visits required to check your device to one time per year. It allows [place] to keep an eye on the functioning of your device to ensure it is working properly. You are scheduled for a device check from home on 11/23/21. You may send your transmission at any time that day. If you have a wireless device, the transmission will be sent automatically. After your physician reviews your transmission, you will receive a postcard with your next transmission date. ? ?Your next appointment:   ?1 year(s) ? ?The format for your next appointment:   ?In Person ? ?Provider:   ?Allegra Lai, MD ? ? ?Thank you for choosing [facility]!! ? ? ?Trinidad Curet, RN ?([phone] ? ?

## 2021-11-16 NOTE — Progress Notes (Signed)
? ?Electrophysiology Office Note ? ? ?Date:  11/16/2021  ? ?IDMakinzi [name], DOB [birth year], MRN [id number] ? ?PCP:  Loraine Leriche., MD  ?Cardiologist:  Angelena Form ?Primary Electrophysiologist:  Samatha Anspach Meredith Leeds, MD   ? ?Chief Complaint: CHF ?  ?History of Present Illness: ?Pam [name] is a 71 y.o. female who is being seen today for the evaluation of CHF at the request of Velazquez, Gretchen Y.,*. Presenting today for electrophysiology evaluation. ? ?She has a history significant for coronary artery disease, chronic systolic heart failure due to ischemic cardiomyopathy, hypertension, hyperlipidemia.  She had a non-STEMI December 2017 was found to have a chronic total occlusion of the LAD.  She has severe calcific stenosis of the circumflex.  Cardiac MRI showed nonviability of the LAD territory.  Her ejection fraction was found to be 38%.  She was stented in the circumflex January 2018.  She is status post Medtronic ICD implanted 08/28/2019. ? ?Today, denies symptoms of palpitations, chest pain, shortness of breath, orthopnea, PND, lower extremity edema, claudication, dizziness, presyncope, syncope, bleeding, or neurologic sequela. The patient is tolerating medications without difficulties.  Since being seen she has done well.  She has intermittent chest pain and has had to take nitroglycerin 3 times this month but otherwise has no major complaints.  She is only had to take 1 dose when she has had to take her nitroglycerin.  She does have follow-up coming with her primary cardiologist. ? ? ?Past Medical History:  ?Diagnosis Date  ? Abnormal EKG   ? AICD (automatic cardioverter/defibrillator) present   ? Alcoholic intoxication without complication ([place])   ? Allergy   ? Cardiomyopathy, ischemic   ? Cataract   ? Chest pain 08/29/2016  ? CHF (congestive heart failure) ([place])   ? Chronic systolic heart failure ([place]) 08/29/2016  ? Facial laceration   ? Fall 07/07/2016  ? Hyperlipidemia   ? Hypertension 03/03/2017   ? Hyponatremia   ? Hypothyroidism 07/08/2016  ? NSTEMI (non-ST elevated myocardial infarction) ([place])   ? Syncope   ? Thyroid disease   ? ?Past Surgical History:  ?Procedure Laterality Date  ? CARDIAC CATHETERIZATION N/A 07/08/2016  ? Procedure: Left Heart Cath and Coronary Angiography;  Surgeon: [name], MD;  Location: [facility];  Service: Cardiovascular;  Laterality: N/A;  ? CARDIAC CATHETERIZATION N/A 08/29/2016  ? Procedure: Coronary Atherectomy;  Surgeon: [name], MD;  Location: [facility];  Service: Cardiovascular;  Laterality: N/A;  ? CARDIAC CATHETERIZATION N/A 08/29/2016  ? Procedure: Coronary Stent Intervention;  Surgeon: [name], MD;  Location: [facility];  Service: Cardiovascular;  Laterality: N/A;  ? CATARACT EXTRACTION, BILATERAL    ? CORONARY ANGIOPLASTY    ? coronary stents     ? ICD IMPLANT N/A 08/28/2019  ? Procedure: ICD IMPLANT;  Surgeon: Constance Haw, MD;  Location: [facility];  Service: Cardiovascular;  Laterality: N/A;  ? PARATHYROIDECTOMY Right 02/02/2021  ? Procedure: RIGHT SUPERIOR PARATHYROIDECTOMY;  Surgeon: Armandina Gemma, MD;  Location: [facility];  Service: General;  Laterality: Right;  ? VAGINAL HYSTERECTOMY    ? ? ? ?Current Outpatient Medications  ?Medication Sig Dispense Refill  ? acetaminophen (TYLENOL) 500 MG tablet Take 1,000 mg by mouth every 6 (six) hours as needed for moderate pain.    ? alendronate (FOSAMAX) 70 MG tablet TAKE 1 TABLET(70 MG) BY MOUTH 1 TIME A WEEK WITH A FULL GLASS OF WATER AND ON AN EMPTY STOMACH (Patient taking differently:  Take 70 mg by mouth every Tuesday.) 12 tablet 1  ? atorvastatin (LIPITOR) 80 MG tablet TAKE 1 TABLET(80 MG) BY MOUTH DAILY (Patient taking differently: Take 80 mg by mouth daily.) 90 tablet 1  ? carvedilol (COREG) 6.25 MG tablet TAKE 1 TABLET(6.25 MG) BY MOUTH TWICE DAILY. (Patient taking differently: Take 6.25 mg by mouth in the morning and at bedtime.) 180 tablet 3  ?  Cholecalciferol (VITAMIN D3) 50 MCG (2000 UT) TABS Take 2,000 mcg by mouth daily.    ? clopidogrel (PLAVIX) 75 MG tablet TAKE 1 TABLET(75 MG) BY MOUTH DAILY WITH BREAKFAST 90 tablet 3  ? furosemide (LASIX) 20 MG tablet TAKE 1 TABLET BY MOUTH EVERY MONDAY, WEDNESDAY, AND FRIDAY. 45 tablet 3  ? levothyroxine (SYNTHROID) 25 MCG tablet Take 37.5 mcg by mouth daily before breakfast.    ? losartan (COZAAR) 100 MG tablet TAKE 1 TABLET(100 MG TOTAL) BEFORE Y MOUTH DAILY. 90 tablet 2  ? ranolazine (RANEXA) 1000 MG SR tablet Take 1 tablet (1,000 mg total) by mouth 2 (two) times daily. 180 tablet 3  ? valACYclovir (VALTREX) 1000 MG tablet Take 1,000 mg by mouth daily.    ? nitroGLYCERIN (NITROSTAT) 0.4 MG SL tablet Place 1 tablet (0.4 mg total) under the tongue every 5 (five) minutes x 3 doses as needed for chest pain. 75 tablet 3  ? ?No current facility-administered medications for this visit.  ? ? ?Allergies:   Lisinopril and Spironolactone  ? ?Social History:  The patient  reports that she quit smoking about 5 years ago. Her smoking use included cigarettes. She has a 50.00 pack-year smoking history. She has never used smokeless tobacco. She reports that she does not currently use alcohol. She reports that she does not use drugs.  ? ?Family History:  The patient's family history includes Heart disease in her mother and sister; Kidney failure in her mother.  ? ?ROS:  Please see the history of present illness.   Otherwise, review of systems is positive for none.   All other systems are reviewed and negative.  ? ?PHYSICAL EXAM: ?VS:  BP 140/70   Pulse 65   Ht '5\' 3"'$  (1.6 m)   Wt 121 lb (54.9 kg)   SpO2 97%   BMI 21.43 kg/m?  , BMI Body mass index is 21.43 kg/m?. ?GEN: Well nourished, well developed, in no acute distress  ?HEENT: normal  ?Neck: no JVD, carotid bruits, or masses ?Cardiac: RRR; no murmurs, rubs, or gallops,no edema  ?Respiratory:  clear to auscultation bilaterally, normal work of breathing ?GI: soft,  nontender, nondistended, + BS ?MS: no deformity or atrophy  ?Skin: warm and dry, device site well healed ?Neuro:  Strength and sensation are intact ?Psych: euthymic mood, full affect ? ?EKG:  EKG is ordered today. ?Personal review of the ekg ordered shows sinus rhythm, anterior T wave inversions, rate 65 ? ?Personal review of the device interrogation today. Results in [place]  ? ?Recent Labs: ?01/25/2021: Hemoglobin 13.7; Platelets 305 ?08/17/2021: BUN 16; Creatinine, Ser 0.90; Potassium 4.4; Sodium 133; TSH 3.46  ? ? ?Lipid Panel  ?   ?Component Value Date/Time  ? CHOL 153 03/01/2018 0801  ? TRIG 41 03/01/2018 0801  ? HDL 93 03/01/2018 0801  ? CHOLHDL 1.6 03/01/2018 0801  ? CHOLHDL 2.0 07/07/2016 1914  ? VLDL 17 07/07/2016 1914  ? [place] 52 03/01/2018 0801  ? ? ? ?Wt Readings from Last 3 Encounters:  ?11/16/21 121 lb (54.9 kg)  ?09/06/21 123 lb 9.6 oz (  56.1 kg)  ?08/19/21 122 lb 3.2 oz (55.4 kg)  ?  ? ? ?Other studies Reviewed: ?Additional studies/ records that were reviewed today include: TTE 06/19/19  ?Review of the above records today demonstrates:  ? 1. Left ventricular ejection fraction, by visual estimation, is 25 to 30%. The left ventricle has severely decreased function. Left ventricular septal wall thickness was normal. There is no left ventricular hypertrophy. ? 2. Severely dilated left ventricular internal cavity size. ? 3. Mid and apical segments severely hypokinetic Preserved basal funciton. ? 4. Global right ventricle has normal systolic function.The right ventricular size is normal. No increase in right ventricular wall thickness. ? 5. Left atrial size was mildly dilated. ? 6. Right atrial size was normal. ? 7. The mitral valve is normal in structure. Mild mitral valve regurgitation. No evidence of mitral stenosis. ? 8. The tricuspid valve is normal in structure. Tricuspid valve regurgitation is not demonstrated. ? 9. The aortic valve is normal in structure. Aortic valve regurgitation is not  visualized. No evidence of aortic valve sclerosis or stenosis. ?10. The pulmonic valve was normal in structure. Pulmonic valve regurgitation is not visualized. ?11. Mildly elevated pulmonary artery systolic pressure. ?12. The inferior

## 2021-11-18 ENCOUNTER — Encounter (HOSPITAL_COMMUNITY): Payer: Self-pay | Admitting: Gastroenterology

## 2021-11-18 ENCOUNTER — Ambulatory Visit (HOSPITAL_COMMUNITY): Payer: Federal, State, Local not specified - PPO | Admitting: Anesthesiology

## 2021-11-18 ENCOUNTER — Ambulatory Visit (HOSPITAL_COMMUNITY)
Admission: RE | Admit: 2021-11-18 | Discharge: 2021-11-18 | Disposition: A | Discharge status: Home / Self Care | Payer: Federal, State, Local not specified - PPO | Attending: Gastroenterology | Admitting: Gastroenterology

## 2021-11-18 ENCOUNTER — Encounter (HOSPITAL_COMMUNITY)
Admission: RE | Disposition: A | Discharge status: Home / Self Care | Payer: Self-pay | Source: Home / Self Care | Attending: Gastroenterology

## 2021-11-18 ENCOUNTER — Other Ambulatory Visit: Payer: Self-pay

## 2021-11-18 DIAGNOSIS — D120 Benign neoplasm of cecum: Secondary | ICD-10-CM | POA: Diagnosis not present

## 2021-11-18 DIAGNOSIS — D122 Benign neoplasm of ascending colon: Secondary | ICD-10-CM

## 2021-11-18 DIAGNOSIS — I110 Hypertensive heart disease with heart failure: Secondary | ICD-10-CM | POA: Insufficient documentation

## 2021-11-18 DIAGNOSIS — R152 Fecal urgency: Secondary | ICD-10-CM

## 2021-11-18 DIAGNOSIS — E039 Hypothyroidism, unspecified: Secondary | ICD-10-CM | POA: Diagnosis not present

## 2021-11-18 DIAGNOSIS — I5022 Chronic systolic (congestive) heart failure: Secondary | ICD-10-CM | POA: Diagnosis not present

## 2021-11-18 DIAGNOSIS — I2510 Atherosclerotic heart disease of native coronary artery without angina pectoris: Secondary | ICD-10-CM | POA: Insufficient documentation

## 2021-11-18 DIAGNOSIS — Z7901 Long term (current) use of anticoagulants: Secondary | ICD-10-CM

## 2021-11-18 DIAGNOSIS — Z87891 Personal history of nicotine dependence: Secondary | ICD-10-CM | POA: Insufficient documentation

## 2021-11-18 DIAGNOSIS — I252 Old myocardial infarction: Secondary | ICD-10-CM | POA: Diagnosis not present

## 2021-11-18 DIAGNOSIS — I255 Ischemic cardiomyopathy: Secondary | ICD-10-CM | POA: Diagnosis not present

## 2021-11-18 DIAGNOSIS — R159 Full incontinence of feces: Secondary | ICD-10-CM | POA: Insufficient documentation

## 2021-11-18 DIAGNOSIS — Z86010 Personal history of colonic polyps: Secondary | ICD-10-CM

## 2021-11-18 DIAGNOSIS — Z1211 Encounter for screening for malignant neoplasm of colon: Secondary | ICD-10-CM | POA: Diagnosis present

## 2021-11-18 DIAGNOSIS — Z7902 Long term (current) use of antithrombotics/antiplatelets: Secondary | ICD-10-CM | POA: Insufficient documentation

## 2021-11-18 DIAGNOSIS — E7800 Pure hypercholesterolemia, unspecified: Secondary | ICD-10-CM | POA: Insufficient documentation

## 2021-11-18 HISTORY — PX: POLYPECTOMY: SHX5525

## 2021-11-18 HISTORY — PX: COLONOSCOPY WITH PROPOFOL: SHX5780

## 2021-11-18 SURGERY — COLONOSCOPY WITH PROPOFOL
Anesthesia: Monitor Anesthesia Care

## 2021-11-18 MED ORDER — PROPOFOL 500 MG/50ML IV EMUL
INTRAVENOUS | Status: DC | PRN
  Administered 2021-11-18: 350 ug/kg/min via INTRAVENOUS

## 2021-11-18 MED ORDER — LACTATED RINGERS IV SOLN: INTRAVENOUS | Status: DC

## 2021-11-18 MED ORDER — PROPOFOL 1000 MG/100ML IV EMUL
INTRAVENOUS | Status: AC
  Filled 2021-11-18: qty 100

## 2021-11-18 MED ORDER — SODIUM CHLORIDE 0.9 % IV SOLN: INTRAVENOUS | Status: DC

## 2021-11-18 SURGICAL SUPPLY — 22 items

## 2021-11-18 NOTE — Anesthesia Postprocedure Evaluation (Signed)
Anesthesia Post Note ? ?Patient: Pam [name] ? ?Procedure(s) Performed: COLONOSCOPY WITH PROPOFOL ?POLYPECTOMY ? ?  ? ?Patient location during evaluation: PACU ?Anesthesia Type: MAC ?Level of consciousness: awake and alert ?Pain management: pain level controlled ?Vital Signs Assessment: post-procedure vital signs reviewed and stable ?Respiratory status: spontaneous breathing, nonlabored ventilation and respiratory function stable ?Cardiovascular status: blood pressure returned to baseline ?Postop Assessment: no apparent nausea or vomiting ?Anesthetic complications: no ? ? ?No notable events documented. ? ?Last Vitals:  ?Vitals:  ? 11/18/21 1155 11/18/21 1200  ?BP:  (!) 159/58  ?Pulse: 62 63  ?Resp: 16 15  ?Temp:    ?SpO2: 99% 97%  ?  ?Last Pain:  ?Vitals:  ? 11/18/21 1144  ?TempSrc: Temporal  ?PainSc: 0-No pain  ? ? ?  ?  ?  ?  ?  ?  ? ?Marthenia Rolling ? ? ? ? ?

## 2021-11-18 NOTE — Transfer of Care (Signed)
Immediate Anesthesia Transfer of Care Note ? ?Patient: Pam [name] ? ?Procedure(s) Performed: COLONOSCOPY WITH PROPOFOL ?POLYPECTOMY ? ?Patient Location: PACU ? ?Anesthesia Type:MAC ? ?Level of Consciousness: awake, alert  and patient cooperative ? ?Airway & Oxygen Therapy: Patient Spontanous Breathing and Patient connected to face mask oxygen ? ?Post-op Assessment: Report given to RN, Post -op Vital signs reviewed and stable and Patient moving all extremities X 4 ? ?Post vital signs: stable ? ?Last Vitals:  ?Vitals Value Taken Time  ?BP    ?Temp    ?Pulse 63 11/18/21 1143  ?Resp 21 11/18/21 1143  ?SpO2 100 % 11/18/21 1143  ?Vitals shown include unvalidated device data. ? ?Last Pain:  ?Vitals:  ? 11/18/21 1048  ?TempSrc: Oral  ?PainSc: 0-No pain  ?   ? ?  ? ?Complications: No notable events documented. ?

## 2021-11-18 NOTE — Op Note (Addendum)
[facility] ?Patient Name: Pam [name] ?Procedure Date: 11/18/2021 ?MRN: [id number] ?Attending MD: Thornton Park MD, MD ?Date of Birth: [birth year] ?CSN: [id number] ?Age: 71 ?Admit Type: Outpatient ?Procedure:                Colonoscopy ?Indications:              Surveillance: Personal history of adenomatous  ?                          polyps on last colonoscopy > 3 years ago ?                          Three tubular adenomas removed on colonoscopy in  ?                          2019 ?Providers:                Thornton Park MD, MD, Jaci Carrel, RN,  ?                          Despina Pole, Technician, Enrigue Catena, CRNA ?Referring MD:              ?Medicines:                Monitored Anesthesia Care ?Complications:            No immediate complications. ?Estimated Blood Loss:     Estimated blood loss was minimal. ?Procedure:                Pre-Anesthesia Assessment: ?                          - Prior to the procedure, a History and Physical  ?                          was performed, and patient medications and  ?                          allergies were reviewed. The patient's tolerance of  ?                          previous anesthesia was also reviewed. The risks  ?                          and benefits of the procedure and the sedation  ?                          options and risks were discussed with the patient.  ?                          All questions were answered, and informed consent  ?                          was obtained. Prior Anticoagulants: The patient has  ?                          taken Plavix (  clopidogrel), last dose was 5 days  ?                          prior to procedure. ASA Grade Assessment: III - A  ?                          patient with severe systemic disease. After  ?                          reviewing the risks and benefits, the patient was  ?                          deemed in satisfactory condition to undergo the  ?                          procedure. ?                           After obtaining informed consent, the colonoscope  ?                          was passed under direct vision. Throughout the  ?                          procedure, the patient's blood pressure, pulse, and  ?                          oxygen saturations were monitored continuously. The  ?                          [id number]-HQ190L ([id number]) Olympus colonoscope was  ?                          introduced through the anus and advanced to the the  ?                          cecum, identified by appendiceal orifice and  ?                          ileocecal valve. The colonoscopy was performed with  ?                          moderate difficulty due to significant looping and  ?                          a tortuous colon. Successful completion of the  ?                          procedure was aided by withdrawing and reinserting  ?                          the scope, straightening and shortening the scope  ?  to obtain bowel loop reduction and applying  ?                          abdominal pressure. The patient tolerated the  ?                          procedure well. The quality of the bowel  ?                          preparation was good. The ileocecal valve,  ?                          appendiceal orifice, and rectum were photographed. ?Scope In: 11:15:21 AM ?Scope Out: 11:35:19 AM ?Scope Withdrawal Time: 0 hours 13 minutes 18 seconds  ?Total Procedure Duration: 0 hours 19 minutes 58 seconds  ?Findings: ?     The perianal and digital rectal examinations were normal. ?     Two sessile polyps were found in the cecum. The polyps were 2 to 3 mm in  ?     size. These polyps were removed with a cold snare. Resection and  ?     retrieval were complete. Estimated blood loss was minimal. ?     The exam was otherwise without abnormality on direct and retroflexion  ?     views. ?Impression:               - Two 2 to 3 mm polyps in the cecum, removed with a  ?                          cold snare.  Resected and retrieved. ?                          - The examination was otherwise normal on direct  ?                          and retroflexion views. ?Moderate Sedation: ?     Not Applicable - Patient had care per Anesthesia. ?Recommendation:           - Patient has a contact number available for  ?                          emergencies. The signs and symptoms of potential  ?                          delayed complications were discussed with the  ?                          patient. Return to normal activities tomorrow.  ?                          Written discharge instructions were provided to the  ?                          patient. ?                          -  Resume previous diet. ?                          - Continue present medications. ?                          - Resume Plavix tomorrow, 11/19/21. ?                          - Await pathology results. ?                          - Repeat colonoscopy date to be determined after  ?                          pending pathology results are reviewed for  ?                          surveillance. ?                          - Emerging evidence supports eating a diet of  ?                          fruits, vegetables, grains, calcium, and yogurt  ?                          while reducing red meat and alcohol may reduce the  ?                          risk of colon cancer. ?                          - Anorectal manometry to further evaluate fecal  ?                          incontinence. Patient asked to call and schedule at  ?                          her convenience. ?                          - Thank you for allowing me to be involved in your  ?                          colon cancer prevention. ?Procedure Code(s):        --- Professional --- ?                          [phone], Colonoscopy, flexible; with removal of  ?                          tumor(s), polyp(s), or other lesion(s) by snare  ?                          technique ?Diagnosis Code(s):        ---  Professional  --- ?                          Z86.010, Personal history of colonic polyps ?                          K63.5, Polyp of colon ?CPT copyright 2019 American Medical Association. All rights reserved. ?The codes documented in this report are preliminary and upon coder review may  ?be revised to meet current compliance requirements. ?Thornton Park MD, MD ?11/18/2021 11:47:51 AM ?This report has been signed electronically. ?Number of Addenda: 0 ?

## 2021-11-18 NOTE — Anesthesia Procedure Notes (Signed)
Procedure Name: S[place] ?Date/Time: 11/18/2021 11:10 AM ?Performed by: Lissa Morales, CRNA ?Pre-anesthesia Checklist: Patient identified, Emergency Drugs available, Suction available, Patient being monitored and Timeout performed ?Patient Re-evaluated:Patient Re-evaluated prior to induction ?Oxygen Delivery Method: Simple face mask ?Preoxygenation: Pre-oxygenation with 100% oxygen ?Placement Confirmation: positive ETCO2 ? ? ? ? ?

## 2021-11-18 NOTE — Anesthesia Preprocedure Evaluation (Addendum)
Anesthesia Evaluation  ?Patient identified by MRN, date of birth, ID band ?Patient awake ? ? ? ?Reviewed: ?Allergy & Precautions, NPO status , Patient's Chart, lab work & pertinent test results, reviewed documented beta blocker date and time  ? ?History of Anesthesia Complications ?Negative for: history of anesthetic complications ? ?Airway ?Mallampati: II ? ?TM Distance: >3 FB ?Neck ROM: Full ? ? ? Dental ? ?(+) Edentulous Lower, Edentulous Upper ?  ?Pulmonary ?former smoker,  ?  ?Pulmonary exam normal ? ? ? ? ? ? ? Cardiovascular ?hypertension, Pt. on home beta blockers and Pt. on medications ?+ CAD, + Past MI and +CHF  ?Normal cardiovascular exam+ Cardiac Defibrillator ? ? ?TTE 2020: EF 25-30%, severe LVE, mid and apical segments severely hypokinetic, preserved basal function, mild LAE, mild MR, mildly elevated PASP ?  ?Neuro/Psych ?negative neurological ROS ? negative psych ROS  ? GI/Hepatic ?negative GI ROS, Neg liver ROS,   ?Endo/Other  ?Hypothyroidism  ? Renal/GU ?negative Renal ROS  ?negative genitourinary ?  ?Musculoskeletal ?negative musculoskeletal ROS ?(+)  ? Abdominal ?  ?Peds ? Hematology ?negative hematology ROS ?(+)   ?Anesthesia Other Findings ?Day of surgery medications reviewed with patient. ? Reproductive/Obstetrics ?negative OB ROS ? ?  ? ? ? ? ? ? ? ? ? ? ? ? ? ?  ?  ? ? ? ? ? ? ? ?Anesthesia Physical ?Anesthesia Plan ? ?ASA: 4 ? ?Anesthesia Plan: MAC  ? ?Post-op Pain Management: Minimal or no pain anticipated  ? ?Induction:  ? ?PONV Risk Score and Plan: 2 and Treatment may vary due to age or medical condition and Propofol infusion ? ?Airway Management Planned: Natural Airway and Nasal Cannula ? ?Additional Equipment: None ? ?Intra-op Plan:  ? ?Post-operative Plan:  ? ?Informed Consent: I have reviewed the patients History and Physical, chart, labs and discussed the procedure including the risks, benefits and alternatives for the proposed anesthesia with the  patient or authorized representative who has indicated his/her understanding and acceptance.  ? ? ? ? ? ?Plan Discussed with: CRNA ? ?Anesthesia Plan Comments:   ? ? ? ? ? ?Anesthesia Quick Evaluation ? ?

## 2021-11-18 NOTE — H&P (Signed)
? ?Referring Provider: No ref. provider found ?Primary Care Physician:  Loraine Leriche., MD ? ? ?Indication for Procedure: History of Colon polyps ? ? ?IMPRESSION:  ?History of colon polyps.  3 tubular adenomas removed in 2019.  Surveillance recommended in 3 years.   ? ?Fecal incontinence. Not interested in further evaluation or treatment at this time. Discussed adding a stool bulking agent, but, she is not interested in additional medicines given her regimen. She can call at any time of schedule anorectal manometry +/- PT for biofeedback therapy if she would like to proceed. ? ?PLAN: ?Colonoscopy  ? ? ? ?HPI: Pam [name] is a 71 y.o. female seen for colon cancer screening surveillance. She has CAD s/p MI 2017, ischemic cardiomyopathy, chronic systolic heart failure, hypertension, hypercholesterolemia, hyperlipidemia, hypertension, hypothyroidism, hyperparathyroidism, osteoporosis. On Plavix. EF 202 was 25-30%. ? ?Colonoscopy 07/16/2018 showed a 5 mm descending polyp, a 2 mm descending polyp, and a 10 mm rectal polyp.  All 3 polyps were tubular adenomas. Surveillance recommended in 3 years. She did well with the prep, procedure, and recovery. ? ?GI ROS is essentially negative except for rare constipation.  She also has poor control of defecation and wears a pad. She does not feel that treatment is necessary at this time and is very resistant to  further evaluation.  ? ?No known family history of colon cancer or polyps.  ? ? ?Past Medical History:  ?Diagnosis Date  ? Abnormal EKG   ? AICD (automatic cardioverter/defibrillator) present   ? Alcoholic intoxication without complication ([place])   ? Allergy   ? Cardiomyopathy, ischemic   ? Cataract   ? Chest pain 08/29/2016  ? CHF (congestive heart failure) ([place])   ? Chronic systolic heart failure ([place]) 08/29/2016  ? Facial laceration   ? Fall 07/07/2016  ? Hyperlipidemia   ? Hypertension 03/03/2017  ? Hyponatremia   ? Hypothyroidism 07/08/2016  ? NSTEMI (non-ST  elevated myocardial infarction) ([place])   ? Syncope   ? Thyroid disease   ? ? ?Past Surgical History:  ?Procedure Laterality Date  ? CARDIAC CATHETERIZATION N/A 07/08/2016  ? Procedure: Left Heart Cath and Coronary Angiography;  Surgeon: [name], MD;  Location: [place] [facility];  Service: Cardiovascular;  Laterality: N/A;  ? CARDIAC CATHETERIZATION N/A 08/29/2016  ? Procedure: Coronary Atherectomy;  Surgeon: [name], MD;  Location: [place] [facility];  Service: Cardiovascular;  Laterality: N/A;  ? CARDIAC CATHETERIZATION N/A 08/29/2016  ? Procedure: Coronary Stent Intervention;  Surgeon: [name], MD;  Location: [place] [facility];  Service: Cardiovascular;  Laterality: N/A;  ? CATARACT EXTRACTION, BILATERAL    ? CORONARY ANGIOPLASTY    ? coronary stents     ? ICD IMPLANT N/A 08/28/2019  ? Procedure: ICD IMPLANT;  Surgeon: Constance Haw, MD;  Location: [place] [facility];  Service: Cardiovascular;  Laterality: N/A;  ? PARATHYROIDECTOMY Right 02/02/2021  ? Procedure: RIGHT SUPERIOR PARATHYROIDECTOMY;  Surgeon: Armandina Gemma, MD;  [facility]: [facility];  Service: General;  Laterality: Right;  ? VAGINAL HYSTERECTOMY    ? ? ?Current Facility-Administered Medications  ?Medication Dose Route Frequency Provider Last Rate Last Admin  ? 0.9 %  sodium chloride infusion   Intravenous Continuous Thornton Park, MD      ? lactated ringers infusion   Intravenous Continuous Thornton Park, MD      ? ? ?Allergies as of 09/08/2021 - Review Complete 09/06/2021  ?Allergen Reaction Noted  ? Lisinopril Cough 03/03/2017  ? Spironolactone Other (See  Comments) 03/07/2017  ? ? ?Family History  ?Problem Relation Age of Onset  ? Kidney failure Mother   ? Heart disease Mother   ?     CHF  ? Heart disease Sister   ? Colon cancer Neg Hx   ? Esophageal cancer Neg Hx   ? Rectal cancer Neg Hx   ? Stomach cancer Neg Hx   ? ? ?Social History  ? ?Socioeconomic History  ? Marital status: Married  ?  Spouse name: Not on file   ? Number of children: 2  ? Years of education: Not on file  ? Highest education level: Not on file  ?Occupational History  ? Occupation: Retired-worked for post office  ?Tobacco Use  ? Smoking status: Former  ?  Packs/day: 1.00  ?  Years: 50.00  ?  Pack years: 50.00  ?  Types: Cigarettes  ?  Quit date: 07/07/2016  ?  Years since quitting: 5.3  ? Smokeless tobacco: Never  ?Vaping Use  ? Vaping Use: Never used  ?Substance and Sexual Activity  ? Alcohol use: Not Currently  ? Drug use: Never  ? Sexual activity: Not Currently  ?Other Topics Concern  ? Not on file  ?Social History Narrative  ? Not on file  ? ?Social Determinants of Health  ? ?Financial Resource Strain: Not on file  ?Food Insecurity: Not on file  ?Transportation Needs: Not on file  ?Physical Activity: Not on file  ?Stress: Not on file  ?Social Connections: Not on file  ?Intimate Partner Violence: Not on file  ? ? ? ? ?Physical Exam: ?Vital signs were reviewed. ?General:   Alert, well-nourished, pleasant and cooperative in NAD ?Head:  Normocephalic and atraumatic. ?Eyes:  Sclera clear, no icterus.   Conjunctiva pink. ?Mouth:  No deformity or lesions.   ?Neck:  Supple; no thyromegaly. ?Lungs:  Clear throughout to auscultation.   No wheezes. ?Heart:  Regular rate and rhythm; no murmurs, defibrillator present ?Abdomen:  Soft, nontender, normal bowel sounds. No rebound or guarding. No hepatosplenomegaly ?Rectal:  Deferred  ?Msk:  Symmetrical without gross deformities. ?Extremities:  No gross deformities or edema. ?Neurologic:  Alert and  oriented x4;  grossly nonfocal ?Skin:  No rash or bruise. ?Psych:  Alert and cooperative. Normal mood and affect. ? ? ?Melissa Pulido L. Tarri Glenn, Md, MPH ?[facility] ?11/18/2021, 10:43 AM ? ? ? ? ?

## 2021-11-22 LAB — SURGICAL PATHOLOGY

## 2021-11-23 ENCOUNTER — Ambulatory Visit (INDEPENDENT_AMBULATORY_CARE_PROVIDER_SITE_OTHER): Payer: Federal, State, Local not specified - PPO

## 2021-11-23 DIAGNOSIS — I255 Ischemic cardiomyopathy: Secondary | ICD-10-CM

## 2021-11-23 LAB — CUP PACEART REMOTE DEVICE CHECK
Battery Remaining Longevity: 123 mo
Battery Voltage: 3.02 V
Brady Statistic RV Percent Paced: 0.13 %
Date Time Interrogation Session: 20230425001603
HighPow Impedance: 57 Ohm
Implantable Lead Implant Date: 20210127
Implantable Lead Location: 753860
Implantable Pulse Generator Implant Date: 20210127
Lead Channel Impedance Value: 304 Ohm
Lead Channel Impedance Value: 380 Ohm
Lead Channel Pacing Threshold Amplitude: 0.75 V
Lead Channel Pacing Threshold Pulse Width: 0.4 ms
Lead Channel Sensing Intrinsic Amplitude: 7.75 mV
Lead Channel Sensing Intrinsic Amplitude: 7.75 mV
Lead Channel Setting Pacing Amplitude: 2 V
Lead Channel Setting Pacing Pulse Width: 0.4 ms
Lead Channel Setting Sensing Sensitivity: 0.3 mV

## 2021-12-07 ENCOUNTER — Encounter: Payer: Self-pay | Admitting: Gastroenterology

## 2021-12-07 NOTE — Progress Notes (Signed)
Recall placed per Dr. Tarri Glenn request. Letter sent via My Chart and mailed to home address.  ?

## 2021-12-09 NOTE — Progress Notes (Signed)
Remote ICD transmission.   

## 2021-12-17 ENCOUNTER — Other Ambulatory Visit: Payer: Self-pay | Admitting: Cardiovascular Disease

## 2021-12-17 DIAGNOSIS — I5022 Chronic systolic (congestive) heart failure: Secondary | ICD-10-CM

## 2021-12-17 DIAGNOSIS — I25119 Atherosclerotic heart disease of native coronary artery with unspecified angina pectoris: Secondary | ICD-10-CM

## 2021-12-20 ENCOUNTER — Ambulatory Visit (INDEPENDENT_AMBULATORY_CARE_PROVIDER_SITE_OTHER): Payer: Federal, State, Local not specified - PPO | Admitting: Cardiovascular Disease

## 2021-12-20 ENCOUNTER — Encounter: Payer: Self-pay | Admitting: Cardiovascular Disease

## 2021-12-20 VITALS — BP 130/66 | HR 62 | Ht 63.0 in | Wt 118.6 lb

## 2021-12-20 DIAGNOSIS — I5022 Chronic systolic (congestive) heart failure: Secondary | ICD-10-CM

## 2021-12-20 DIAGNOSIS — E785 Hyperlipidemia, unspecified: Secondary | ICD-10-CM | POA: Diagnosis not present

## 2021-12-20 DIAGNOSIS — I25119 Atherosclerotic heart disease of native coronary artery with unspecified angina pectoris: Secondary | ICD-10-CM

## 2021-12-20 DIAGNOSIS — I255 Ischemic cardiomyopathy: Secondary | ICD-10-CM

## 2021-12-20 MED ORDER — CARVEDILOL 6.25 MG PO TABS: ORAL_TABLET | ORAL | 3 refills | Status: DC

## 2021-12-20 MED ORDER — CLOPIDOGREL BISULFATE 75 MG PO TABS: ORAL_TABLET | ORAL | 3 refills | Status: DC

## 2021-12-20 NOTE — Patient Instructions (Signed)
Medication Instructions:  No changes *If you need a refill on your cardiac medications before your next appointment, please call your pharmacy*   Lab Work: none If you have labs (blood work) drawn today and your tests are completely normal, you will receive your results only by: MyChart Message (if you have MyChart) OR A paper copy in the mail If you have any lab test that is abnormal or we need to change your treatment, we will call you to review the results.   Testing/Procedures: none   Follow-Up: At [facility], you and your health needs are our priority.  As part of our continuing mission to provide you with exceptional heart care, we have created designated Provider Care Teams.  These Care Teams include your primary Cardiologist (physician) and Advanced Practice Providers (APPs -  Physician Assistants and Nurse Practitioners) who all work together to provide you with the care you need, when you need it.  We recommend signing up for the patient portal called "MyChart".  Sign up information is provided on this After Visit Summary.  MyChart is used to connect with patients for Virtual Visits (Telemedicine).  Patients are able to view lab/test results, encounter notes, upcoming appointments, etc.  Non-urgent messages can be sent to your provider as well.   To learn more about what you can do with MyChart, go to [email or web address].    Your next appointment:   12 month(s)  The format for your next appointment:   In Person  Provider:   Christopher McAlhany, MD     Important Information About Sugar       

## 2021-12-20 NOTE — Progress Notes (Signed)
Chief Complaint  Patient presents with   follow up    CAD   History of Present Illness: 71 yo female with history of CAD, ischemic cardiomyopathy, chronic systolic CHF, HTN, HLD and hypothyroidism who is here today for cardiac follow up. She had been followed by Dr. Saunders Revel. I met her in November 2019. She was admitted to [facility] in December 2017 with a NSTEMI. She was found to have a chronic occlusion of the mid LAD and severe, calcified stenosis in the proximal Circumflex.  LVEF was 30% by LV gram and 35-40% by echo. Cardiac MRI December 2017 with non-viability of the anterior wall in the LAD territory. LVEF=38% by cardiac MRI. The Circumflex stenosis was treated with orbital atherectomy and stenting using a drug eluting stent in a staged procedure in January 2018. Echo November 2020 with LVEF=25-30%. Dilated LV. Mild MR. ICD placed in January 2021 and followed by Dr. Lennie Odor.   She is here today for follow up. The patient denies any dyspnea, palpitations, lower extremity edema, orthopnea, PND, dizziness, near syncope or syncope. Rare chest pains. None over the past month.    Primary Care Physician: Loraine Leriche., MD  Past Medical History:  Diagnosis Date   Abnormal EKG    AICD (automatic cardioverter/defibrillator) present    Alcoholic intoxication without complication [facility])    Allergy    Cardiomyopathy, ischemic    Cataract    Chest pain 08/29/2016   CHF (congestive heart failure) ([place])    Chronic systolic heart failure ([place]) 08/29/2016   Facial laceration    Fall 07/07/2016   Hyperlipidemia    Hypertension 03/03/2017   Hyponatremia    Hypothyroidism 07/08/2016   NSTEMI (non-ST elevated myocardial infarction) [facility])    Syncope    Thyroid disease     Past Surgical History:  Procedure Laterality Date   CARDIAC CATHETERIZATION N/A 07/08/2016   Procedure: Left Heart Cath and Coronary Angiography;  Surgeon: [name], MD;  Location: [place] [facility];  Service:  Cardiovascular;  Laterality: N/A;   CARDIAC CATHETERIZATION N/A 08/29/2016   Procedure: Coronary Atherectomy;  Surgeon: [name], MD;  Location: [place] [facility];  Service: Cardiovascular;  Laterality: N/A;   CARDIAC CATHETERIZATION N/A 08/29/2016   Procedure: Coronary Stent Intervention;  Surgeon: [name], MD;  Location: [place] [facility];  Service: Cardiovascular;  Laterality: N/A;   CATARACT EXTRACTION, BILATERAL     COLONOSCOPY WITH PROPOFOL N/A 11/18/2021   Procedure: COLONOSCOPY WITH PROPOFOL;  Surgeon: Thornton Park, MD;  [facility];  Service: Gastroenterology;  Laterality: N/A;   CORONARY ANGIOPLASTY     coronary stents      ICD IMPLANT N/A 08/28/2019   Procedure: ICD IMPLANT;  Surgeon: Constance Haw, MD;  Location: [place] [facility];  Service: Cardiovascular;  Laterality: N/A;   PARATHYROIDECTOMY Right 02/02/2021   Procedure: RIGHT SUPERIOR PARATHYROIDECTOMY;  Surgeon: Armandina Gemma, MD;  [facility];  Service: General;  Laterality: Right;   POLYPECTOMY  11/18/2021   Procedure: POLYPECTOMY;  Surgeon: Thornton Park, MD;  [facility];  Service: Gastroenterology;;   VAGINAL HYSTERECTOMY      Current Outpatient Medications  Medication Sig Dispense Refill   acetaminophen (TYLENOL) 500 MG tablet Take 1,000 mg by mouth every 6 (six) hours as needed for moderate pain.     alendronate (FOSAMAX) 70 MG tablet TAKE 1 TABLET(70 MG) BY MOUTH 1 TIME A WEEK WITH A FULL GLASS OF WATER AND ON AN EMPTY STOMACH 12 tablet  1   atorvastatin (LIPITOR) 80 MG tablet TAKE 1 TABLET(80 MG) BY MOUTH DAILY 90 tablet 1   Cholecalciferol (VITAMIN D3) 50 MCG (2000 UT) TABS Take 2,000 mcg by mouth daily.     furosemide (LASIX) 20 MG tablet TAKE 1 TABLET BY MOUTH EVERY MONDAY, WEDNESDAY, AND FRIDAY. 45 tablet 3   levothyroxine (SYNTHROID) 25 MCG tablet Take 37.5 mcg by mouth daily before breakfast.     losartan (COZAAR) 100 MG tablet TAKE 1 TABLET(100 MG  TOTAL) BEFORE Y MOUTH DAILY. 90 tablet 2   nitroGLYCERIN (NITROSTAT) 0.4 MG SL tablet Place 1 tablet (0.4 mg total) under the tongue every 5 (five) minutes x 3 doses as needed for chest pain. 75 tablet 3   ranolazine (RANEXA) 1000 MG SR tablet Take 1 tablet (1,000 mg total) by mouth 2 (two) times daily. 180 tablet 3   valACYclovir (VALTREX) 1000 MG tablet Take 1,000 mg by mouth daily.     carvedilol (COREG) 6.25 MG tablet TAKE 1 TABLET(6.25 MG) BY MOUTH TWICE DAILY. 180 tablet 3   clopidogrel (PLAVIX) 75 MG tablet TAKE 1 TABLET(75 MG) BY MOUTH DAILY WITH BREAKFAST 90 tablet 3   No current facility-administered medications for this visit.    Allergies  Allergen Reactions   Lisinopril Cough    Dry cough   Spironolactone Other (See Comments)    Low sodium    Social History   Socioeconomic History   Marital status: Married    Spouse name: Not on file   Number of children: 2   Years of education: Not on file   Highest education level: Not on file  Occupational History   Occupation: Retired-worked for post office  Tobacco Use   Smoking status: Former    Packs/day: 1.00    Years: 50.00    Pack years: 50.00    Types: Cigarettes    Quit date: 07/07/2016    Years since quitting: 5.4   Smokeless tobacco: Never  Vaping Use   Vaping Use: Never used  Substance and Sexual Activity   Alcohol use: Not Currently   Drug use: Never   Sexual activity: Not Currently  Other Topics Concern   Not on file  Social History Narrative   Not on file   Social Determinants of Health   Financial Resource Strain: Not on file  Food Insecurity: Not on file  Transportation Needs: Not on file  Physical Activity: Not on file  Stress: Not on file  Social Connections: Not on file  Intimate Partner Violence: Not on file    Family History  Problem Relation Age of Onset   Kidney failure Mother    Heart disease Mother        CHF   Heart disease Sister    Colon cancer Neg Hx    Esophageal cancer  Neg Hx    Rectal cancer Neg Hx    Stomach cancer Neg Hx     Review of Systems:  As stated in the HPI and otherwise negative.   BP 130/66   Pulse 62   Ht $R'5\' 3"'Gm$  (1.6 m)   Wt 118 lb 9.6 oz (53.8 kg)   SpO2 97%   BMI 21.01 kg/m   Physical Examination: General: Well developed, well nourished, NAD  HEENT: OP clear, mucus membranes moist  SKIN: warm, dry. No rashes. Neuro: No focal deficits  Musculoskeletal: Muscle strength 5/5 all ext  Psychiatric: Mood and affect normal  Neck: No JVD, no carotid bruits, no thyromegaly, no lymphadenopathy.  Lungs:Clear bilaterally, no wheezes, rhonci, crackles Cardiovascular: Regular rate and rhythm. No murmurs, gallops or rubs. Abdomen:Soft. Bowel sounds present. Non-tender.  Extremities: No lower extremity edema. Pulses are 2 + in the bilateral DP/PT.  EKG:  EKG is not ordered today. The ekg ordered today demonstrates   Echo November 2020:  1. Left ventricular ejection fraction, by visual estimation, is 25 to  30%. The left ventricle has severely decreased function. Left ventricular  septal wall thickness was normal. There is no left ventricular  hypertrophy.   2. Severely dilated left ventricular internal cavity size.   3. Mid and apical segments severely hypokinetic Preserved basal funciton.   4. Global right ventricle has normal systolic function.The right  ventricular size is normal. No increase in right ventricular wall  thickness.   5. Left atrial size was mildly dilated.   6. Right atrial size was normal.   7. The mitral valve is normal in structure. Mild mitral valve  regurgitation. No evidence of mitral stenosis.   8. The tricuspid valve is normal in structure. Tricuspid valve  regurgitation is not demonstrated.   9. The aortic valve is normal in structure. Aortic valve regurgitation is  not visualized. No evidence of aortic valve sclerosis or stenosis.  10. The pulmonic valve was normal in structure. Pulmonic valve   regurgitation is not visualized.  11. Mildly elevated pulmonary artery systolic pressure.  12. The inferior vena cava is normal in size with greater than 50%  respiratory variability, suggesting right atrial pressure of 3 mmHg.   Recent Labs: 01/25/2021: Hemoglobin 13.7; Platelets 305 08/17/2021: BUN 16; Creatinine, Ser 0.90; Potassium 4.4; Sodium 133; TSH 3.46   Lipid Panel    Component Value Date/Time   CHOL 153 03/01/2018 0801   TRIG 41 03/01/2018 0801   HDL 93 03/01/2018 0801   CHOLHDL 1.6 03/01/2018 0801   CHOLHDL 2.0 07/07/2016 1914   VLDL 17 07/07/2016 1914   LDLCALC 52 03/01/2018 0801     Wt Readings from Last 3 Encounters:  12/20/21 118 lb 9.6 oz (53.8 kg)  11/18/21 121 lb 0.5 oz (54.9 kg)  11/16/21 121 lb (54.9 kg)     Other studies Reviewed: Additional studies/ records that were reviewed today include: . Review of the above records demonstrates:   Assessment and Plan:   1. CAD with stable angina: No change in chronic chest pains. She has used NTG several times over the past year. Will continue Plavix, statin, beta blocker and Ranexa.        2. Chronic systolic CHF/Ischemic cardiomyopathy: ICD in place. No volume overload on exam. Continue Coreg, Losartan and Lasix. Delene Loll has not been started due to soft BP in the past.    3. Hyperlipidemia: Lipids followed in primary care. LDL 83 but HDL 96. Continue statin.    4. Tobacco abuse, in remission: She stopped smoking in 2017.   Current medicines are reviewed at length with the patient today.  The patient does not have concerns regarding medicines.  The following changes have been made:  no change  Labs/ tests ordered today include:   No orders of the defined types were placed in this encounter.  Disposition:   F/U with me in 12 months  Signed, Lauree Chandler, MD 12/20/2021 10:09 AM    [facility] [place], [place], [place]  [address] Phone: [phone]; Fax: [phone]

## 2022-01-10 ENCOUNTER — Other Ambulatory Visit: Payer: Self-pay

## 2022-01-10 MED ORDER — FUROSEMIDE 20 MG PO TABS: ORAL_TABLET | ORAL | 3 refills | Status: DC

## 2022-01-31 ENCOUNTER — Other Ambulatory Visit: Payer: Self-pay

## 2022-01-31 DIAGNOSIS — I5022 Chronic systolic (congestive) heart failure: Secondary | ICD-10-CM

## 2022-01-31 MED ORDER — RANOLAZINE ER 1000 MG PO TB12: 1000.0000 mg | ORAL_TABLET | Freq: Two times a day (BID) | ORAL | 3 refills | Status: DC

## 2022-02-15 ENCOUNTER — Other Ambulatory Visit (INDEPENDENT_AMBULATORY_CARE_PROVIDER_SITE_OTHER): Payer: Federal, State, Local not specified - PPO

## 2022-02-15 DIAGNOSIS — E559 Vitamin D deficiency, unspecified: Secondary | ICD-10-CM | POA: Diagnosis not present

## 2022-02-15 DIAGNOSIS — E871 Hypo-osmolality and hyponatremia: Secondary | ICD-10-CM | POA: Diagnosis not present

## 2022-02-15 LAB — BASIC METABOLIC PANEL
BUN: 11 mg/dL (ref 6–23)
CO2: 28 mEq/L (ref 19–32)
Calcium: 9.6 mg/dL (ref 8.4–10.5)
Chloride: 97 mEq/L (ref 96–112)
Creatinine, Ser: 0.9 mg/dL (ref 0.40–1.20)
GFR: 64.74 mL/min (ref >60.00)
Glucose, Bld: 94 mg/dL (ref 70–99)
Potassium: 4.5 mEq/L (ref 3.5–5.1)
Sodium: 130 mEq/L — ABNORMAL LOW (ref 135–145)

## 2022-02-15 LAB — VITAMIN D 25 HYDROXY (VIT D DEFICIENCY, FRACTURES): VITD: 55.31 ng/mL (ref 30.00–100.00)

## 2022-02-17 ENCOUNTER — Encounter: Payer: Self-pay | Admitting: Endocrinology

## 2022-02-17 ENCOUNTER — Ambulatory Visit: Payer: Federal, State, Local not specified - PPO | Admitting: Endocrinology

## 2022-02-17 VITALS — BP 126/60 | HR 64 | Ht 63.0 in | Wt 117.4 lb

## 2022-02-17 DIAGNOSIS — M810 Age-related osteoporosis without current pathological fracture: Secondary | ICD-10-CM

## 2022-02-17 DIAGNOSIS — Z8639 Personal history of other endocrine, nutritional and metabolic disease: Secondary | ICD-10-CM | POA: Diagnosis not present

## 2022-02-17 DIAGNOSIS — E871 Hypo-osmolality and hyponatremia: Secondary | ICD-10-CM

## 2022-02-17 MED ORDER — ALENDRONATE SODIUM 70 MG PO TABS: ORAL_TABLET | ORAL | 1 refills | Status: DC

## 2022-02-17 NOTE — Progress Notes (Signed)
Patient ID: Pam [name], female   DOB: 02/03/1951, 71 y.o.   MRN: [id number]                  Chief complaint: Endocrinology follow-up  History of Present Illness:    History of hyperparathyroidism:  Parathyroidectomy was done on 02/02/2021  Background history  Review of records show that she has had a high calcium since 05/10/2018 when her calcium was 10.6. Previously highest calcium was 10.2 done in 2018 PTH 107 Since her calcium was significantly higher at 12.2 she was referred for parathyroidectomy  Calcium has been normal after parathyroid surgery   Lab Results  Component Value Date   CALCIUM 9.6 02/15/2022   CALCIUM 9.4 08/17/2021   CALCIUM 9.6 03/15/2021   CALCIUM 9.5 02/03/2021   CALCIUM 11.1 (H) 01/25/2021   CALCIUM 12.2 (H) 11/18/2020   CALCIUM 11.7 (H) 10/13/2020   CALCIUM 11.9 (H) 09/21/2020   CALCIUM 11.1 (H) 03/16/2020    No history of low trauma fractures, renal insufficiency, nephrolithiasis, sarcoidosis, known carcinoma  OSTEOPOROSIS:  Screening bone density showed osteoporosis at the hip and radius as of 07/2019  Since then she has been on alendronate 70 mg weekly  She has been regular with taking this on empty stomach every week with a glass of water, no side effects  Bone density results:  07/2019 Lumbar spine L1-L3 (L4) Femoral neck (FN) 33% distal radius Ultra distal radius  T-score -1.0 RFN: -2.8 LFN: -2.4  -1.6  -2.7    Also on  vitamin D 2000  25 (OH) Vitamin D level as follows:  Lab Results  Component Value Date   VD25OH 55.31 02/15/2022   VD25OH 25.77 (L) 08/17/2021   HYPONATREMIA: See review of systems   Allergies as of 02/17/2022       Reactions   Lisinopril Cough   Dry cough   Spironolactone Other (See Comments)   Low sodium        Medication List        Accurate as of February 17, 2022 10:37 AM. If you have any questions, ask your nurse or doctor.          acetaminophen 500 MG tablet Commonly known as:  TYLENOL Take 1,000 mg by mouth every 6 (six) hours as needed for moderate pain.   alendronate 70 MG tablet Commonly known as: FOSAMAX TAKE 1 TABLET(70 MG) BY MOUTH 1 TIME A WEEK WITH A FULL GLASS OF WATER AND ON AN EMPTY STOMACH   atorvastatin 80 MG tablet Commonly known as: LIPITOR TAKE 1 TABLET(80 MG) BY MOUTH DAILY   carvedilol 6.25 MG tablet Commonly known as: COREG TAKE 1 TABLET(6.25 MG) BY MOUTH TWICE DAILY.   clopidogrel 75 MG tablet Commonly known as: PLAVIX TAKE 1 TABLET(75 MG) BY MOUTH DAILY WITH BREAKFAST   furosemide 20 MG tablet Commonly known as: LASIX TAKE 1 TABLET BY MOUTH EVERY MONDAY, WEDNESDAY, AND FRIDAY.   levothyroxine 25 MCG tablet Commonly known as: SYNTHROID Take 37.5 mcg by mouth daily before breakfast.   losartan 100 MG tablet Commonly known as: COZAAR TAKE 1 TABLET(100 MG TOTAL) BEFORE Y MOUTH DAILY.   nitroGLYCERIN 0.4 MG SL tablet Commonly known as: NITROSTAT Place 1 tablet (0.4 mg total) under the tongue every 5 (five) minutes x 3 doses as needed for chest pain.   ranolazine 1000 MG SR tablet Commonly known as: RANEXA Take 1 tablet (1,000 mg total) by mouth 2 (two) times daily.   valACYclovir 1000 MG tablet Commonly  known as: VALTREX Take 1,000 mg by mouth daily.   Vitamin D3 50 MCG (2000 UT) Tabs Take 2,000 mcg by mouth daily.        Allergies:  Allergies  Allergen Reactions   Lisinopril Cough    Dry cough   Spironolactone Other (See Comments)    Low sodium    Past Medical History:  Diagnosis Date   Abnormal EKG    AICD (automatic cardioverter/defibrillator) present    Alcoholic intoxication without complication (HCC)    Allergy    Cardiomyopathy, ischemic    Cataract    Chest pain 08/29/2016   CHF (congestive heart failure) ([place])    Chronic systolic heart failure ([place]) 08/29/2016   Facial laceration    Fall 07/07/2016   Hyperlipidemia    Hypertension 03/03/2017   Hyponatremia    Hypothyroidism 07/08/2016    NSTEMI (non-ST elevated myocardial infarction) [facility])    Syncope    Thyroid disease     Past Surgical History:  Procedure Laterality Date   CARDIAC CATHETERIZATION N/A 07/08/2016   Procedure: Left Heart Cath and Coronary Angiography;  Surgeon: [name], MD;  Location: [place] [facility];  Service: Cardiovascular;  Laterality: N/A;   CARDIAC CATHETERIZATION N/A 08/29/2016   Procedure: Coronary Atherectomy;  Surgeon: [name], MD;  Location: [place] [facility];  Service: Cardiovascular;  Laterality: N/A;   CARDIAC CATHETERIZATION N/A 08/29/2016   Procedure: Coronary Stent Intervention;  Surgeon: [name], MD;  Location: [place] [facility];  Service: Cardiovascular;  Laterality: N/A;   CATARACT EXTRACTION, BILATERAL     COLONOSCOPY WITH PROPOFOL N/A 11/18/2021   Procedure: COLONOSCOPY WITH PROPOFOL;  Surgeon: Thornton Park, MD;  Location: [facility];  Service: Gastroenterology;  Laterality: N/A;   CORONARY ANGIOPLASTY     coronary stents      ICD IMPLANT N/A 08/28/2019   Procedure: ICD IMPLANT;  Surgeon: Constance Haw, MD;  Location: [place] [facility];  Service: Cardiovascular;  Laterality: N/A;   PARATHYROIDECTOMY Right 02/02/2021   Procedure: RIGHT SUPERIOR PARATHYROIDECTOMY;  Surgeon: Armandina Gemma, MD;  Location: [facility];  Service: General;  Laterality: Right;   POLYPECTOMY  11/18/2021   Procedure: POLYPECTOMY;  Surgeon: Thornton Park, MD;  Location: [facility];  Service: Gastroenterology;;   VAGINAL HYSTERECTOMY      Family History  Problem Relation Age of Onset   Kidney failure Mother    Heart disease Mother        CHF   Heart disease Sister    Colon cancer Neg Hx    Esophageal cancer Neg Hx    Rectal cancer Neg Hx    Stomach cancer Neg Hx     Social History:  reports that she quit smoking about 5 years ago. Her smoking use included cigarettes. She has a 50.00 pack-year smoking history. She has never used smokeless tobacco. She reports that  she does not currently use alcohol. She reports that she does not use drugs.  Review of Systems  She has mild hypothyroidism, apparently asymptomatic She has been prescribed levothyroxine 37.5 mcg daily by her PCP  Last TSH:  Lab Results  Component Value Date   TSH 3.46 08/17/2021     HYPONATREMIA: She has had long-term hyponatremia of unclear etiology, not on thiazide diuretics except Lasix Also not on any other drugs causing hyponatremia Previously urine osmolality was relatively high at 332 and urine sodium last 39  Her sodium has been as low as 127 Has been advised to be restricting  her fluids and cut back drinking fluids throughout the day including water, coffee, juices and occasional diet drinks, however recently she has not been cutting back on her fluid intake  Sodium is slightly lower at 130  She has had upper normal potassium levels at times  Wt Readings from Last 3 Encounters:  02/17/22 117 lb 6.4 oz (53.3 kg)  12/20/21 118 lb 9.6 oz (53.8 kg)  11/18/21 121 lb 0.5 oz (54.9 kg)   Lab Results  Component Value Date   NA 130 (L) 02/15/2022   K 4.5 02/15/2022   CL 97 02/15/2022   CO2 28 02/15/2022     EXAM:  BP 126/60 (BP Location: Left Arm, Patient Position: Sitting, Cuff Size: Normal)   Pulse 64   Ht '5\' 3"'$  (1.6 m)   Wt 117 lb 6.4 oz (53.3 kg)   SpO2 95%   BMI 20.80 kg/m     Assessment/Plan:    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 with her relatively high osmolality and urine sodium Likely because of not restricting her fluids as much he has slightly lower sodium of 130 Discussed her diagnosis and in order to counteract tendency to water retention she needs to decrease her fluid intake at least by one third She will cut back on both water and juices  OSTEOPOROSIS: She is being treated with Fosamax which she is tolerating well Vitamin D level therapeutic with supplements  Will reassess her bone density now and this will be scheduled  by patient Given her information to schedule Continue vitamin D unchanged  HYPOTHYROIDISM: Mild and taking only 25 mcg levothyroxine This is followed by PCP  There are no Patient Instructions on file for this visit.   Elayne Snare 02/17/2022, 10:37 AM   11/26/20:

## 2022-02-17 NOTE — Patient Instructions (Addendum)
Limit water or fluids to 4-5 glasses daily  Please call [phone] to schedule a Bone Density (DexaScan) a the [employer] office at [place].

## 2022-02-22 ENCOUNTER — Ambulatory Visit (INDEPENDENT_AMBULATORY_CARE_PROVIDER_SITE_OTHER): Payer: Federal, State, Local not specified - PPO

## 2022-02-22 DIAGNOSIS — I255 Ischemic cardiomyopathy: Secondary | ICD-10-CM | POA: Diagnosis not present

## 2022-02-22 LAB — CUP PACEART REMOTE DEVICE CHECK
Battery Remaining Longevity: 121 mo
Battery Voltage: 3.01 V
Brady Statistic RV Percent Paced: 0.04 %
Date Time Interrogation Session: 20230725043825
HighPow Impedance: 58 Ohm
Implantable Lead Implant Date: 20210127
Implantable Lead Location: 753860
Implantable Pulse Generator Implant Date: 20210127
Lead Channel Impedance Value: 323 Ohm
Lead Channel Impedance Value: 380 Ohm
Lead Channel Pacing Threshold Amplitude: 0.75 V
Lead Channel Pacing Threshold Pulse Width: 0.4 ms
Lead Channel Sensing Intrinsic Amplitude: 7.75 mV
Lead Channel Sensing Intrinsic Amplitude: 7.75 mV
Lead Channel Setting Pacing Amplitude: 2 V
Lead Channel Setting Pacing Pulse Width: 0.4 ms
Lead Channel Setting Sensing Sensitivity: 0.3 mV

## 2022-03-02 ENCOUNTER — Ambulatory Visit (INDEPENDENT_AMBULATORY_CARE_PROVIDER_SITE_OTHER)
Admission: RE | Admit: 2022-03-02 | Discharge: 2022-03-02 | Disposition: A | Discharge status: Home / Self Care | Payer: Federal, State, Local not specified - PPO | Source: Ambulatory Visit | Attending: Endocrinology | Admitting: Endocrinology

## 2022-03-02 DIAGNOSIS — M810 Age-related osteoporosis without current pathological fracture: Secondary | ICD-10-CM

## 2022-03-06 NOTE — Progress Notes (Signed)
Please call to let patient know that the bone density which was low at the hip is improved about 4%, continue alendronate

## 2022-03-22 NOTE — Progress Notes (Signed)
Remote ICD transmission.   

## 2022-04-26 ENCOUNTER — Emergency Department (HOSPITAL_COMMUNITY): Payer: Medicare Other

## 2022-04-26 ENCOUNTER — Other Ambulatory Visit: Payer: Self-pay

## 2022-04-26 ENCOUNTER — Inpatient Hospital Stay (HOSPITAL_COMMUNITY)
Admission: EM | Admit: 2022-04-26 | Discharge: 2022-05-02 | DRG: 871 | Disposition: A | Discharge status: Home / Self Care | Payer: Medicare Other | Attending: Internal Medicine | Admitting: Internal Medicine

## 2022-04-26 DIAGNOSIS — Z9842 Cataract extraction status, left eye: Secondary | ICD-10-CM

## 2022-04-26 DIAGNOSIS — I2510 Atherosclerotic heart disease of native coronary artery without angina pectoris: Secondary | ICD-10-CM | POA: Diagnosis present

## 2022-04-26 DIAGNOSIS — J9601 Acute respiratory failure with hypoxia: Secondary | ICD-10-CM | POA: Diagnosis present

## 2022-04-26 DIAGNOSIS — Z1152 Encounter for screening for COVID-19: Secondary | ICD-10-CM

## 2022-04-26 DIAGNOSIS — Z7902 Long term (current) use of antithrombotics/antiplatelets: Secondary | ICD-10-CM | POA: Diagnosis not present

## 2022-04-26 DIAGNOSIS — Z87891 Personal history of nicotine dependence: Secondary | ICD-10-CM | POA: Diagnosis not present

## 2022-04-26 DIAGNOSIS — R6520 Severe sepsis without septic shock: Secondary | ICD-10-CM | POA: Diagnosis not present

## 2022-04-26 DIAGNOSIS — R0602 Shortness of breath: Secondary | ICD-10-CM

## 2022-04-26 DIAGNOSIS — I25118 Atherosclerotic heart disease of native coronary artery with other forms of angina pectoris: Secondary | ICD-10-CM | POA: Diagnosis not present

## 2022-04-26 DIAGNOSIS — M810 Age-related osteoporosis without current pathological fracture: Secondary | ICD-10-CM | POA: Diagnosis present

## 2022-04-26 DIAGNOSIS — R778 Other specified abnormalities of plasma proteins: Secondary | ICD-10-CM

## 2022-04-26 DIAGNOSIS — J441 Chronic obstructive pulmonary disease with (acute) exacerbation: Secondary | ICD-10-CM | POA: Diagnosis present

## 2022-04-26 DIAGNOSIS — A419 Sepsis, unspecified organism: Secondary | ICD-10-CM | POA: Diagnosis present

## 2022-04-26 DIAGNOSIS — Z8249 Family history of ischemic heart disease and other diseases of the circulatory system: Secondary | ICD-10-CM | POA: Diagnosis not present

## 2022-04-26 DIAGNOSIS — I5020 Unspecified systolic (congestive) heart failure: Secondary | ICD-10-CM | POA: Diagnosis not present

## 2022-04-26 DIAGNOSIS — J189 Pneumonia, unspecified organism: Secondary | ICD-10-CM

## 2022-04-26 DIAGNOSIS — I110 Hypertensive heart disease with heart failure: Secondary | ICD-10-CM | POA: Diagnosis present

## 2022-04-26 DIAGNOSIS — Z95810 Presence of automatic (implantable) cardiac defibrillator: Secondary | ICD-10-CM

## 2022-04-26 DIAGNOSIS — I428 Other cardiomyopathies: Secondary | ICD-10-CM | POA: Diagnosis present

## 2022-04-26 DIAGNOSIS — E039 Hypothyroidism, unspecified: Secondary | ICD-10-CM | POA: Diagnosis present

## 2022-04-26 DIAGNOSIS — E871 Hypo-osmolality and hyponatremia: Secondary | ICD-10-CM | POA: Diagnosis not present

## 2022-04-26 DIAGNOSIS — Z20822 Contact with and (suspected) exposure to covid-19: Secondary | ICD-10-CM | POA: Diagnosis present

## 2022-04-26 DIAGNOSIS — R7989 Other specified abnormal findings of blood chemistry: Secondary | ICD-10-CM

## 2022-04-26 DIAGNOSIS — Z90710 Acquired absence of both cervix and uterus: Secondary | ICD-10-CM | POA: Diagnosis not present

## 2022-04-26 DIAGNOSIS — J439 Emphysema, unspecified: Secondary | ICD-10-CM | POA: Diagnosis present

## 2022-04-26 DIAGNOSIS — E222 Syndrome of inappropriate secretion of antidiuretic hormone: Secondary | ICD-10-CM | POA: Diagnosis present

## 2022-04-26 DIAGNOSIS — I252 Old myocardial infarction: Secondary | ICD-10-CM

## 2022-04-26 DIAGNOSIS — Z955 Presence of coronary angioplasty implant and graft: Secondary | ICD-10-CM

## 2022-04-26 DIAGNOSIS — I5023 Acute on chronic systolic (congestive) heart failure: Secondary | ICD-10-CM

## 2022-04-26 DIAGNOSIS — R051 Acute cough: Secondary | ICD-10-CM

## 2022-04-26 DIAGNOSIS — D75839 Thrombocytosis, unspecified: Secondary | ICD-10-CM | POA: Diagnosis not present

## 2022-04-26 DIAGNOSIS — Z888 Allergy status to other drugs, medicaments and biological substances status: Secondary | ICD-10-CM

## 2022-04-26 DIAGNOSIS — Z9841 Cataract extraction status, right eye: Secondary | ICD-10-CM

## 2022-04-26 DIAGNOSIS — Z841 Family history of disorders of kidney and ureter: Secondary | ICD-10-CM

## 2022-04-26 DIAGNOSIS — Z79890 Hormone replacement therapy (postmenopausal): Secondary | ICD-10-CM

## 2022-04-26 DIAGNOSIS — I255 Ischemic cardiomyopathy: Secondary | ICD-10-CM | POA: Diagnosis present

## 2022-04-26 DIAGNOSIS — E030 Congenital hypothyroidism with diffuse goiter: Secondary | ICD-10-CM | POA: Diagnosis not present

## 2022-04-26 DIAGNOSIS — I214 Non-ST elevation (NSTEMI) myocardial infarction: Secondary | ICD-10-CM | POA: Diagnosis present

## 2022-04-26 DIAGNOSIS — I10 Essential (primary) hypertension: Secondary | ICD-10-CM | POA: Diagnosis present

## 2022-04-26 DIAGNOSIS — I2582 Chronic total occlusion of coronary artery: Secondary | ICD-10-CM | POA: Diagnosis present

## 2022-04-26 DIAGNOSIS — E785 Hyperlipidemia, unspecified: Secondary | ICD-10-CM | POA: Diagnosis present

## 2022-04-26 LAB — CBC WITH DIFFERENTIAL/PLATELET
Abs Immature Granulocytes: 0.14 10*3/uL — ABNORMAL HIGH (ref 0.00–0.07)
Basophils Absolute: 0.1 10*3/uL (ref 0.0–0.1)
Basophils Relative: 1 %
Eosinophils Absolute: 0.1 10*3/uL (ref 0.0–0.5)
Eosinophils Relative: 0 %
HCT: 35.1 % — ABNORMAL LOW (ref 36.0–46.0)
Hemoglobin: 12.6 g/dL (ref 12.0–15.0)
Immature Granulocytes: 1 %
Lymphocytes Relative: 20 %
Lymphs Abs: 2.6 10*3/uL (ref 0.7–4.0)
MCH: 35.2 pg — ABNORMAL HIGH (ref 26.0–34.0)
MCHC: 35.9 g/dL (ref 30.0–36.0)
MCV: 98 fL (ref 80.0–100.0)
Monocytes Absolute: 1.3 10*3/uL — ABNORMAL HIGH (ref 0.1–1.0)
Monocytes Relative: 10 %
Neutro Abs: 9.1 10*3/uL — ABNORMAL HIGH (ref 1.7–7.7)
Neutrophils Relative %: 68 %
Platelets: 469 10*3/uL — ABNORMAL HIGH (ref 150–400)
RBC: 3.58 MIL/uL — ABNORMAL LOW (ref 3.87–5.11)
RDW: 14.1 % (ref 11.5–15.5)
WBC: 13.2 10*3/uL — ABNORMAL HIGH (ref 4.0–10.5)
nRBC: 0 % (ref 0.0–0.2)

## 2022-04-26 LAB — LACTIC ACID, PLASMA
Lactic Acid, Venous: 1.2 mmol/L (ref 0.5–1.9)
Lactic Acid, Venous: 0.9 mmol/L (ref 0.5–1.9)

## 2022-04-26 LAB — COMPREHENSIVE METABOLIC PANEL
ALT: 44 U/L (ref 0–44)
AST: 36 U/L (ref 15–41)
Albumin: 3.3 g/dL — ABNORMAL LOW (ref 3.5–5.0)
Alkaline Phosphatase: 129 U/L — ABNORMAL HIGH (ref 38–126)
Anion gap: 10 (ref 5–15)
BUN: 13 mg/dL (ref 8–23)
CO2: 26 mmol/L (ref 22–32)
Calcium: 9 mg/dL (ref 8.9–10.3)
Chloride: 90 mmol/L — ABNORMAL LOW (ref 98–111)
Creatinine, Ser: 0.96 mg/dL (ref 0.44–1.00)
GFR, Estimated: >60 mL/min (ref >60)
Glucose, Bld: 138 mg/dL — ABNORMAL HIGH (ref 70–99)
Potassium: 4.7 mmol/L (ref 3.5–5.1)
Sodium: 126 mmol/L — ABNORMAL LOW (ref 135–145)
Total Bilirubin: 0.6 mg/dL (ref 0.3–1.2)
Total Protein: 6.9 g/dL (ref 6.5–8.1)

## 2022-04-26 LAB — MAGNESIUM: Magnesium: 1.8 mg/dL (ref 1.7–2.4)

## 2022-04-26 LAB — BRAIN NATRIURETIC PEPTIDE: B Natriuretic Peptide: 713.3 pg/mL — ABNORMAL HIGH (ref 0.0–100.0)

## 2022-04-26 LAB — TROPONIN I (HIGH SENSITIVITY)
Troponin I (High Sensitivity): 184 ng/L (ref <18)
Troponin I (High Sensitivity): 1180 ng/L (ref <18)

## 2022-04-26 LAB — SARS CORONAVIRUS 2 BY RT PCR: SARS Coronavirus 2 by RT PCR: NEGATIVE

## 2022-04-26 LAB — LIPASE, BLOOD: Lipase: 33 U/L (ref 11–51)

## 2022-04-26 MED ORDER — SODIUM CHLORIDE 0.9 % IV SOLN
1.0000 g | Freq: Once | INTRAVENOUS | Status: AC
  Administered 2022-04-26: 1 g via INTRAVENOUS
  Filled 2022-04-26: qty 10

## 2022-04-26 MED ORDER — SODIUM CHLORIDE 0.9 % IV SOLN
500.0000 mg | Freq: Once | INTRAVENOUS | Status: AC
  Administered 2022-04-26: 500 mg via INTRAVENOUS
  Filled 2022-04-26: qty 5

## 2022-04-26 MED ORDER — FUROSEMIDE 10 MG/ML IJ SOLN
20.0000 mg | Freq: Once | INTRAMUSCULAR | Status: AC
  Administered 2022-04-26: 20 mg via INTRAVENOUS
  Filled 2022-04-26: qty 2

## 2022-04-26 MED ORDER — IOHEXOL 350 MG/ML SOLN
60.0000 mL | Freq: Once | INTRAVENOUS | Status: AC | PRN
  Administered 2022-04-26: 60 mL via INTRAVENOUS

## 2022-04-26 NOTE — Progress Notes (Signed)
RT transported pt to and from CT without event. 

## 2022-04-26 NOTE — ED Notes (Signed)
Pt unable to tolerate coming off bipap. RT placed pt back on bipap. MD notified.

## 2022-04-26 NOTE — ED Provider Notes (Signed)
[facility] EMERGENCY DEPARTMENT Provider Note   CSN: [id number] Arrival date & time: 04/26/22  1455     History  No chief complaint on file.   Pam [name] is a 71 y.o. female.  The history is provided by the patient, the EMS personnel and medical records. No language interpreter was used.  Shortness of Breath Severity:  Severe Onset quality:  Gradual Duration:  2 days (2 weeks of cough) Timing:  Constant Progression:  Worsening Chronicity:  New Context: URI   Relieved by:  Oxygen Worsened by:  Deep breathing Ineffective treatments:  None tried Associated symptoms: chest pain, cough, fever and sputum production   Associated symptoms: no abdominal pain, no diaphoresis, no headaches, no neck pain, no rash, no vomiting and no wheezing   Risk factors: no hx of PE/DVT        Home Medications Prior to Admission medications   Medication Sig Start Date End Date Taking? Authorizing Provider  acetaminophen (TYLENOL) 500 MG tablet Take 1,000 mg by mouth every 6 (six) hours as needed for moderate pain.    [provider]  alendronate (FOSAMAX) 70 MG tablet TAKE 1 TABLET(70 MG) BY MOUTH 1 TIME A WEEK WITH A FULL GLASS OF WATER AND ON AN EMPTY STOMACH 02/17/22   Elayne Snare, MD  atorvastatin (LIPITOR) 80 MG tablet TAKE 1 TABLET(80 MG) BY MOUTH DAILY 11/27/18   Burnell Blanks, MD  carvedilol (COREG) 6.25 MG tablet TAKE 1 TABLET(6.25 MG) BY MOUTH TWICE DAILY. 12/20/21   Burnell Blanks, MD  Cholecalciferol (VITAMIN D3) 50 MCG (2000 UT) TABS Take 2,000 mcg by mouth daily.    [provider]  clopidogrel (PLAVIX) 75 MG tablet TAKE 1 TABLET(75 MG) BY MOUTH DAILY WITH BREAKFAST 12/20/21   Burnell Blanks, MD  furosemide (LASIX) 20 MG tablet TAKE 1 TABLET BY MOUTH EVERY MONDAY, WEDNESDAY, AND FRIDAY. 01/10/22   Burnell Blanks, MD  levothyroxine (SYNTHROID) 25 MCG tablet Take 37.5 mcg by mouth daily before breakfast.    [provider]  losartan (COZAAR) 100 MG tablet TAKE 1 TABLET(100 MG TOTAL) BEFORE Y MOUTH DAILY. 10/19/20   Camnitz, Ocie Doyne, MD  nitroGLYCERIN (NITROSTAT) 0.4 MG SL tablet Place 1 tablet (0.4 mg total) under the tongue every 5 (five) minutes x 3 doses as needed for chest pain. 11/16/21   Camnitz, Ocie Doyne, MD  ranolazine (RANEXA) 1000 MG SR tablet Take 1 tablet (1,000 mg total) by mouth 2 (two) times daily. 01/31/22   Burnell Blanks, MD  valACYclovir (VALTREX) 1000 MG tablet Take 1,000 mg by mouth daily.    [provider]      Allergies    Lisinopril and Spironolactone    Review of Systems   Review of Systems  Constitutional:  Positive for chills, fatigue and fever. Negative for diaphoresis.  HENT:  Positive for congestion.   Eyes:  Negative for visual disturbance.  Respiratory:  Positive for cough, sputum production, chest tightness and shortness of breath. Negative for wheezing and stridor.   Cardiovascular:  Positive for chest pain and leg swelling. Negative for palpitations.  Gastrointestinal:  Positive for diarrhea. Negative for abdominal pain, constipation, nausea and vomiting.  Genitourinary:  Negative for dysuria.  Musculoskeletal:  Negative for back pain, neck pain and neck stiffness.  Skin:  Negative for rash.  Neurological:  Negative for headaches.  Psychiatric/Behavioral:  Negative for agitation and confusion.   All other systems reviewed and are negative.   Physical  Exam Updated Vital Signs BP (!) 165/86   Pulse 100   Temp 97.6 F (36.4 C) (Oral)   Resp (!) 27   Ht '5\' 3"'$  (1.6 m)   Wt 54.4 kg   SpO2 98%   BMI 21.26 kg/m  Physical Exam Vitals and nursing note reviewed.  Constitutional:      General: She is not in acute distress.    Appearance: She is well-developed. She is not ill-appearing, toxic-appearing or diaphoretic.  HENT:     Head: Normocephalic and atraumatic.     Nose: No congestion or rhinorrhea.     Mouth/Throat:     Mouth:  Mucous membranes are moist.     Pharynx: No oropharyngeal exudate or posterior oropharyngeal erythema.  Eyes:     Extraocular Movements: Extraocular movements intact.     Conjunctiva/sclera: Conjunctivae normal.     Pupils: Pupils are equal, round, and reactive to light.  Cardiovascular:     Rate and Rhythm: Normal rate and regular rhythm.     Heart sounds: No murmur heard. Pulmonary:     Effort: Respiratory distress present.     Breath sounds: No stridor. Rhonchi and rales present. No wheezing.  Chest:     Chest wall: No tenderness.  Abdominal:     General: Abdomen is flat.     Palpations: Abdomen is soft.     Tenderness: There is no abdominal tenderness. There is no right CVA tenderness, left CVA tenderness, guarding or rebound.  Musculoskeletal:        General: No swelling or tenderness.     Cervical back: Neck supple. No tenderness.     Right lower leg: Edema present.     Left lower leg: Edema present.  Skin:    General: Skin is warm and dry.     Capillary Refill: Capillary refill takes less than 2 seconds.     Findings: No erythema or rash.  Neurological:     General: No focal deficit present.     Mental Status: She is alert.     Sensory: No sensory deficit.     Motor: No weakness.  Psychiatric:        Mood and Affect: Mood normal.     ED Results / Procedures / Treatments   Labs (all labs ordered are listed, but only abnormal results are displayed) Labs Reviewed  CBC WITH DIFFERENTIAL/PLATELET - Abnormal; Notable for the following components:      Result Value   WBC 13.2 (*)    RBC 3.58 (*)    HCT 35.1 (*)    MCH 35.2 (*)    Platelets 469 (*)    Neutro Abs 9.1 (*)    Monocytes Absolute 1.3 (*)    Abs Immature Granulocytes 0.14 (*)    All other components within normal limits  CULTURE, BLOOD (ROUTINE X 2)  CULTURE, BLOOD (ROUTINE X 2)  SARS CORONAVIRUS 2 BY RT PCR  LACTIC ACID, PLASMA  COMPREHENSIVE METABOLIC PANEL  LACTIC ACID, PLASMA  LIPASE, BLOOD   BRAIN NATRIURETIC PEPTIDE  MAGNESIUM  TROPONIN I (HIGH SENSITIVITY)    EKG EKG Interpretation  Date/Time:  Tuesday April 26 2022 15:05:22 EDT Ventricular Rate:  99 PR Interval:  180 QRS Duration: 91 QT Interval:  321 QTC Calculation: 412 R Axis:   70 Text Interpretation: Sinus rhythm Anteroseptal infarct, old Nonspecific T abnormalities, lateral leads when compared to prior, faster rate. No STEMI Confirmed by Antony Blackbird [phone]) on 04/26/2022 3:20:11 PM  Radiology DG Chest Portable  1 View  Result Date: 04/26/2022 CLINICAL DATA:  Hypoxia. EXAM: PORTABLE CHEST 1 VIEW COMPARISON:  August 28, 2019 FINDINGS: Cardiac pacemaker in stable position. Calcific atherosclerotic disease and tortuosity of the aorta. Cardiomediastinal silhouette is normal. Mediastinal contours appear intact. Diffuse reticulonodular infiltration of the lungs with lower lobe predominance. Background upper lobe predominant emphysema. Osseous structures are without acute abnormality. Soft tissues are grossly normal. IMPRESSION: 1. Diffuse reticulonodular infiltration of the lungs with lower lobe predominance, suspicious for atypical pneumonia or interstitial edema with atypical presentation due to underlying chronic lung disease. 2. Background upper lobe predominant emphysema. Electronically Signed   By: Fidela Salisbury M.D.   On: 04/26/2022 15:47    Procedures Procedures    CRITICAL CARE Performed by: Gwenyth Allegra Jered Heiny Total critical care time: 35 minutes Critical care time was exclusive of separately billable procedures and treating other patients. Critical care was necessary to treat or prevent imminent or life-threatening deterioration. Critical care was time spent personally by me on the following activities: development of treatment plan with patient and/or surrogate as well as nursing, discussions with consultants, evaluation of patient's response to treatment, examination of patient, obtaining  history from patient or surrogate, ordering and performing treatments and interventions, ordering and review of laboratory studies, ordering and review of radiographic studies, pulse oximetry and re-evaluation of patient's condition.   Medications Ordered in ED Medications - No data to display  ED Course/ Medical Decision Making/ A&P                           Medical Decision Making Amount and/or Complexity of Data Reviewed Labs: ordered. Radiology: ordered.    Briyah Wheelwright is a 71 y.o. female with a past medical history significant for CAD with previous PCI and AICD, CHF, hypertension, hyperlipidemia, and hypothyroidism who presents with 2 weeks of worsening cough and 40 hours of rapidly worsening shortness of breath, pleuritic chest discomfort, fatigue, and chills.  According to patient, she has had a mild cough for the last 2 weeks but took a home COVID test that was negative.  She reports that over the last 2 days it has rapidly worsened and was found to have an oxygen saturation of 78% on room air at home with EMS.  She reports a pleuritic chest tightness and discomfort across her chest that is new.  She denies any trauma or rashes.  She denies any nausea or vomiting but does report some mild diarrhea.  No constipation.  No urinary changes.  She said she did not take her fluid pill today and does think her legs are swelling more edematous than baseline.  She denies other complaints and reports that her pain is moderate across her chest but worse with deep breathing.  EMS had her on nonrebreather as she did not initially tolerate nasal cannula.  She was given 324 of aspirin and 1 nitroglycerin with EMS.  On arrival, airway appears intact.  Breath sounds are coarse with rales in the bases.  No significant wheezing on my exam and patient denies history of asthma or COPD.  Chest was nontender.  No murmur on my exam.  Abdomen nontender.  Good pulses in extremities but patient does have edema in  both legs mildly.  Patient was tachypneic and borderline tachycardic but did have her oxygen saturation improved into the 90s on 6 L nonrebreather.  She is afebrile.  EKG does not show STEMI.  Clinically I am concerned with pneumonia  versus CHF exacerbation versus pulmonary embolism versus other viral URI causing the new hypoxia.  Given her new oxygen requirement, do not feel she is safe for going home and will likely need admission once work-up is completed.  We will get labs including troponins, BNP, and a CT PE study given the high concern for pulmonary embolism given the pleuritic discomfort she is having and her new hypoxia in the 70s.  We will attempt to interrogate her ICD to make sure there are no arrhythmias that were discovered although she denies any instances of it defibrillating to her knowledge.  Anticipate admission after work-up is completed.  Due to increased respiratory effort, patient started on BiPAP.  Still waiting on results.  Anticipate admission for further management.         Final Clinical Impression(s) / ED Diagnoses Final diagnoses:  Acute respiratory failure with hypoxia (HCC)  Acute cough  Shortness of breath     Clinical Impression: 1. Acute respiratory failure with hypoxia ([place])   2. Acute cough   3. Shortness of breath     Disposition: Care transferred to oncoming team to await admission when work-up is completed.  This note was prepared with assistance of Systems analyst. Occasional wrong-word or sound-a-like substitutions may have occurred due to the inherent limitations of voice recognition software.      Jahlon Baines, Gwenyth Allegra, MD 04/26/22 [phone]

## 2022-04-26 NOTE — Progress Notes (Signed)
RT attempted to take pt off bipap, pt unable to tolerate, RN and MD aware. Pt back on bipap, RT will try again at a later time.

## 2022-04-26 NOTE — ED Provider Notes (Signed)
I assumed care of the patient at signout, and at that point she was awaiting additional studies including CT angiography.  After that study resulted and discussed with her, and her husband.  Patient was still on BiPAP, and an attempt to remove that once during my shift was unsuccessful, but she may be appropriate for removal of this after she has received her Lasix, with anticipated diuresis.  I discussed her case with her cardiology colleagues and they will follow as a consulting service and see her tonight, and are amenable to Lasix, following the patient.  Patient admitted to the stepdown unit for further monitoring, management.   Carmin Muskrat, MD 04/26/22 [phone]

## 2022-04-26 NOTE — ED Notes (Signed)
Interrogation was attempted twice MD aware unable to connect at this time will attempt again once pt is more stable [employer] card in wallet the connection is not going through at this time

## 2022-04-26 NOTE — ED Notes (Addendum)
RT at bedside for bipap pt breathing is getting worse more labored and pt states feeling more SOB and having CP with breathing pt states took nitro at home with no relief

## 2022-04-26 NOTE — ED Notes (Signed)
MD stated to take pt off bipap to see how pt responds. RT called and requested to remove pt from bipap.

## 2022-04-26 NOTE — ED Notes (Signed)
Attempted to coordinate CT x2 without success.

## 2022-04-26 NOTE — Consult Note (Incomplete)
Cardiology Consultation   Patient ID: Pam [name] MRN: [id number]; DOB: 1951-02-17  Admit date: 04/26/2022 Date of Consult: 04/26/2022  PCP:  Loraine Leriche., MD   [place] Providers Cardiologist:  Lauree Chandler, MD  Electrophysiologist:  Constance Haw, MD  {    Patient Profile:   Pam [name] is a 71 y.o. female with a hx of CAD, ICM, HFrEFs/p [place]-ICD, HTN, HLD, and hypothyroidism who is being seen 04/26/2022 for the evaluation of heart failure and troponin elevation at the request of the ED.  History of Present Illness:   Pam [name] reports fo ED today with cough for two weeks and SOB sating 78% on RA. She was given ASA, nitro paste.   Of note she was admitted to [facility] in December 2017 with a NSTEMI. She was found to have a chronic occlusion of the mid LAD and severe, calcified stenosis in the proximal Circumflex.  LVEF was 30% by LV gram and 35-40% by echo. Cardiac MRI December 2017 with non-viability of the anterior wall in the LAD territory. LVEF=38% by cardiac MRI. The Circumflex stenosis was treated with orbital atherectomy and stenting using a drug eluting stent in a staged procedure in January 2018. Echo November 2020 with LVEF=25-30%. Dilated LV. Mild MR. ICD placed in January 2021 and followed by Dr. Lennie Odor.    Past Medical History:  Diagnosis Date   Abnormal EKG    AICD (automatic cardioverter/defibrillator) present    Alcoholic intoxication without complication ([place])    Allergy    Cardiomyopathy, ischemic    Cataract    Chest pain 08/29/2016   CHF (congestive heart failure) ([place])    Chronic systolic heart failure ([place]) 08/29/2016   Facial laceration    Fall 07/07/2016   Hyperlipidemia    Hypertension 03/03/2017   Hyponatremia    Hypothyroidism 07/08/2016   NSTEMI (non-ST elevated myocardial infarction) [facility])    Syncope    Thyroid disease     Past Surgical History:  Procedure Laterality Date   CARDIAC CATHETERIZATION N/A  07/08/2016   Procedure: Left Heart Cath and Coronary Angiography;  Surgeon: [name], MD;  Location: [place] CV LAB;  Service: Cardiovascular;  Laterality: N/A;   CARDIAC CATHETERIZATION N/A 08/29/2016   Procedure: Coronary Atherectomy;  Surgeon: [name], MD;  Location: [place] [facility] LAB;  Service: Cardiovascular;  Laterality: N/A;   CARDIAC CATHETERIZATION N/A 08/29/2016   Procedure: Coronary Stent Intervention;  Surgeon: [name], MD;  Location: [place] [facility];  Service: Cardiovascular;  Laterality: N/A;   CATARACT EXTRACTION, BILATERAL     COLONOSCOPY WITH PROPOFOL N/A 11/18/2021   Procedure: COLONOSCOPY WITH PROPOFOL;  Surgeon: Thornton Park, MD;  Location: [facility];  Service: Gastroenterology;  Laterality: N/A;   CORONARY ANGIOPLASTY     coronary stents      ICD IMPLANT N/A 08/28/2019   Procedure: ICD IMPLANT;  Surgeon: Constance Haw, MD;  Location: [place] CV LAB;  Service: Cardiovascular;  Laterality: N/A;   PARATHYROIDECTOMY Right 02/02/2021   Procedure: RIGHT SUPERIOR PARATHYROIDECTOMY;  Surgeon: Armandina Gemma, MD;  Location: [facility];  Service: General;  Laterality: Right;   POLYPECTOMY  11/18/2021   Procedure: POLYPECTOMY;  Surgeon: Thornton Park, MD;  Location: [facility];  Service: Gastroenterology;;   VAGINAL HYSTERECTOMY       {Home Medications (Optional):21181}  Inpatient Medications: Scheduled Meds:  Continuous Infusions:  PRN Meds:   Allergies:    Allergies  Allergen Reactions   Lisinopril Cough  Dry cough   Spironolactone Other (See Comments)    Low sodium    Social History:   Social History   Socioeconomic History   Marital status: Married    Spouse name: Not on file   Number of children: 2   Years of education: Not on file   Highest education level: Not on file  Occupational History   Occupation: Retired-worked for post office  Tobacco Use   Smoking status: Former    Packs/day: 1.00    Years:  50.00    Total pack years: 50.00    Types: Cigarettes    Quit date: 07/07/2016    Years since quitting: 5.8   Smokeless tobacco: Never  Vaping Use   Vaping Use: Never used  Substance and Sexual Activity   Alcohol use: Not Currently   Drug use: Never   Sexual activity: Not Currently  Other Topics Concern   Not on file  Social History Narrative   Not on file   Social Determinants of Health   Financial Resource Strain: Not on file  Food Insecurity: Not on file  Transportation Needs: Not on file  Physical Activity: Not on file  Stress: Not on file  Social Connections: Not on file  Intimate Partner Violence: Not on file    Family History:   *** Family History  Problem Relation Age of Onset   Kidney failure Mother    Heart disease Mother        CHF   Heart disease Sister    Colon cancer Neg Hx    Esophageal cancer Neg Hx    Rectal cancer Neg Hx    Stomach cancer Neg Hx      ROS:  Please see the history of present illness.  *** All other ROS reviewed and negative.     Physical Exam/Data:   Vitals:   04/26/22 2200 04/26/22 2215 04/26/22 2230 04/26/22 2245  BP: (!) 112/59 118/60 115/67 115/67  Pulse: 80 78 78 85  Resp: (!) 26  (!) 33 (!) 26  Temp:      TempSrc:      SpO2: 100% 100% 100% 100%  Weight:      Height:       No intake or output data in the 24 hours ending 04/26/22 2252    04/26/2022    3:01 PM 02/17/2022   10:15 AM 12/20/2021    9:21 AM  Last 3 Weights  Weight (lbs) 120 lb 117 lb 6.4 oz 118 lb 9.6 oz  Weight (kg) 54.432 kg 53.252 kg 53.797 kg     Body mass index is 21.26 kg/m.  General:  Well nourished, well developed, in no acute distress*** HEENT: normal Neck: no JVD Vascular: No carotid bruits; Distal pulses 2+ bilaterally Cardiac:  normal S1, S2; RRR; no murmur *** Lungs:  clear to auscultation bilaterally, no wheezing, rhonchi or rales  Abd: soft, nontender, no hepatomegaly  Ext: no edema Musculoskeletal:  No deformities, BUE and BLE  strength normal and equal Skin: warm and dry  Neuro:  CNs 2-12 intact, no focal abnormalities noted Psych:  Normal affect   EKG:  The EKG was personally reviewed and demonstrates:  *** Telemetry:  Telemetry was personally reviewed and demonstrates:  ***  Relevant CV Studies: ***  Laboratory Data:  High Sensitivity Troponin:   Recent Labs  Lab 04/26/22 1555 04/26/22 1904  TROPONINIHS 184* 1,180*     Chemistry Recent Labs  Lab 04/26/22 1555  NA 126*  K 4.7  CL 90*  CO2 26  GLUCOSE 138*  BUN 13  CREATININE 0.96  CALCIUM 9.0  MG 1.8  GFRNONAA >60  ANIONGAP 10    Recent Labs  Lab 04/26/22 1555  PROT 6.9  ALBUMIN 3.3*  AST 36  ALT 44  ALKPHOS 129*  BILITOT 0.6   Lipids No results for input(s): "CHOL", "TRIG", "HDL", "LABVLDL", "LDLCALC", "CHOLHDL" in the last 168 hours.  Hematology Recent Labs  Lab 04/26/22 1555  WBC 13.2*  RBC 3.58*  HGB 12.6  HCT 35.1*  MCV 98.0  MCH 35.2*  MCHC 35.9  RDW 14.1  PLT 469*   Thyroid No results for input(s): "TSH", "FREET4" in the last 168 hours.  BNP Recent Labs  Lab 04/26/22 1556  BNP 713.3*    DDimer No results for input(s): "DDIMER" in the last 168 hours.   Radiology/Studies:  CT Angio Chest PE W and/or Wo Contrast  Result Date: 04/26/2022 CLINICAL DATA:  Shortness of breath; PE suspected EXAM: CT ANGIOGRAPHY CHEST WITH CONTRAST TECHNIQUE: Multidetector CT imaging of the chest was performed using the standard protocol during bolus administration of intravenous contrast. Multiplanar CT image reconstructions and MIPs were obtained to evaluate the vascular anatomy. RADIATION DOSE REDUCTION: This exam was performed according to the departmental dose-optimization program which includes automated exposure control, adjustment of the mA and/or kV according to patient size and/or use of iterative reconstruction technique. CONTRAST:  23m OMNIPAQUE IOHEXOL 350 MG/ML SOLN COMPARISON:  Radiographs earlier today and CT  12/06/2021 FINDINGS: Cardiovascular: Satisfactory opacification of the pulmonary arteries to the segmental level. No evidence of pulmonary embolism. Aortic and coronary artery atherosclerosis. Coronary stenting. Small pericardial effusion. Left chest wall ICD. Mediastinum/Nodes: No enlarged mediastinal, hilar, or axillary lymph nodes. Thyroid gland, trachea, and esophagus demonstrate no significant findings. Lungs/Pleura: Advanced emphysema. Diffuse interlobular septal thickening greatest in the lower lobes. There is more confluence infiltrates in the right middle lobe, lingula and greatest in the posterior lower lobes. Bronchial wall thickening greatest in the lower lobes. Normal variant azygous fissure. Trace bilateral pleural effusions. Upper Abdomen: No acute abnormality though evaluation is compromised by respiratory motion. Musculoskeletal: No chest wall abnormality. No acute osseous findings. Body wall anasarca. Review of the MIP images confirms the above findings. IMPRESSION: 1. No acute pulmonary embolism. 2. Interlobular septal thickening with more confluent opacities in the lower lungs. This is favored to represent infection superimposed on a background of advanced emphysema though pulmonary edema could be considered. 3. Aortic Atherosclerosis (ICD10-I70.0) and Emphysema (ICD10-J43.9). 4. Coronary artery disease/stenting. 5. Small pericardial effusion. Electronically Signed   By: TPlacido SouM.D.   On: 04/26/2022 21:57   DG Chest Portable 1 View  Result Date: 04/26/2022 CLINICAL DATA:  Hypoxia. EXAM: PORTABLE CHEST 1 VIEW COMPARISON:  August 28, 2019 FINDINGS: Cardiac pacemaker in stable position. Calcific atherosclerotic disease and tortuosity of the aorta. Cardiomediastinal silhouette is normal. Mediastinal contours appear intact. Diffuse reticulonodular infiltration of the lungs with lower lobe predominance. Background upper lobe predominant emphysema. Osseous structures are without acute  abnormality. Soft tissues are grossly normal. IMPRESSION: 1. Diffuse reticulonodular infiltration of the lungs with lower lobe predominance, suspicious for atypical pneumonia or interstitial edema with atypical presentation due to underlying chronic lung disease. 2. Background upper lobe predominant emphysema. Electronically Signed   By: DFidela SalisburyM.D.   On: 04/26/2022 15:47     Assessment and Plan:   ***   Risk Assessment/Risk Scores:  {Complete the following score calculators/questions to meet required metrics.  Press F2         :[id number]}   {Is the patient being seen for unstable angina, ACS, NSTEMI or STEMI?:[phone]} {Does this patient have CHF or CHF symptoms?      :[id number]} {Does this patient have ATRIAL FIBRILLATION?:[phone]}  {Are we signing off today?:[id number]}  For questions or updates, please contact [place] Please consult [email or web address] for contact info under    Signed, Margurite Auerbach, MD  04/26/2022 10:52 PM

## 2022-04-26 NOTE — Progress Notes (Signed)
RT placed pt on BIPAP at this time. Pt is tolerating settings well. RT will continue to monitor pt as needed.

## 2022-04-26 NOTE — ED Triage Notes (Signed)
Pt bib ems from home; cough x 2 weeks; worse x 2 days; sob, rhonchi in upper lobes; RA sats 78%; 96% 4L, pt not tolerating [place] with ems; placed on NRB;cp with cough; 324 ASA, 1 nitro given pta; 170/90, hr 90

## 2022-04-26 NOTE — Progress Notes (Signed)
Pt moved from room 9 to room 18 without complications.

## 2022-04-26 NOTE — ED Notes (Signed)
Pt transported to and from CT with this RN and RT.

## 2022-04-26 NOTE — ED Notes (Signed)
Called RT to request an RT to transport pt to CT with this RN. No RT available at this time. Will attempt to coordinate CT scan at a different time.

## 2022-04-26 NOTE — Consult Note (Incomplete)
Cardiology Consultation   Patient ID: Pam [name] MRN: [id number]; DOB: [birth year]  Admit date: 04/26/2022 Date of Consult: 04/26/2022  PCP:  Loraine Leriche., MD   [place] Providers Cardiologist:  Lauree Chandler, MD  Electrophysiologist:  Constance Haw, MD  {    Patient Profile:   Pam [name] is a 71 y.o. female with a hx of CAD, ICM, HFrEFs/p [place]-ICD, HTN, HLD, and hypothyroidism who is being seen 04/26/2022 for the evaluation of heart failure and troponin elevation at the request of the ED.  History of Present Illness:   Pam [name] reports fo ED today with SOB sating 78% on RA. She states she has been feeling poorly with congestion and cough for 1.5 weeks and noted a fever yesterday. She is accompanied by her husband who is with her. She states she hasn't laid flat in a long time. She denies chest pain. History was difficult as she was wearing BiPAP and became short of breath with it off only for a short time.   Per her clinic note, "she was admitted to [facility] in December 2017 with a NSTEMI. She was found to have a chronic occlusion of the mid LAD and severe, calcified stenosis in the proximal Circumflex.  LVEF was 30% by LV gram and 35-40% by echo. Cardiac MRI December 2017 with non-viability of the anterior wall in the LAD territory. LVEF=38% by cardiac MRI. The Circumflex stenosis was treated with orbital atherectomy and stenting using a drug eluting stent in a staged procedure in January 2018. Echo November 2020 with LVEF=25-30%. Dilated LV. Mild MR. ICD placed in January 2021 and followed by Pam [name]."   Past Medical History:  Diagnosis Date  . Abnormal EKG   . AICD (automatic cardioverter/defibrillator) present   . Alcoholic intoxication without complication ([place])   . Allergy   . Cardiomyopathy, ischemic   . Cataract   . Chest pain 08/29/2016  . CHF (congestive heart failure) ([place])   . Chronic systolic heart failure ([place]) 08/29/2016  .  Facial laceration   . Fall 07/07/2016  . Hyperlipidemia   . Hypertension 03/03/2017  . Hyponatremia   . Hypothyroidism 07/08/2016  . NSTEMI (non-ST elevated myocardial infarction) ([place])   . Syncope   . Thyroid disease     Past Surgical History:  Procedure Laterality Date  . CARDIAC CATHETERIZATION N/A 07/08/2016   Procedure: Left Heart Cath and Coronary Angiography;  Surgeon: [name], MD;  Location: [place] [facility] LAB;  Service: Cardiovascular;  Laterality: N/A;  . CARDIAC CATHETERIZATION N/A 08/29/2016   Procedure: Coronary Atherectomy;  Surgeon: [name], MD;  Location: [place] [facility];  Service: Cardiovascular;  Laterality: N/A;  . CARDIAC CATHETERIZATION N/A 08/29/2016   Procedure: Coronary Stent Intervention;  Surgeon: [name], MD;  Location: [place] [facility];  Service: Cardiovascular;  Laterality: N/A;  . CATARACT EXTRACTION, BILATERAL    . COLONOSCOPY WITH PROPOFOL N/A 11/18/2021   Procedure: COLONOSCOPY WITH PROPOFOL;  Surgeon: Thornton Park, MD;  Location: [facility];  Service: Gastroenterology;  Laterality: N/A;  . CORONARY ANGIOPLASTY    . coronary stents     . ICD IMPLANT N/A 08/28/2019   Procedure: ICD IMPLANT;  Surgeon: Constance Haw, MD;  Location: [place] [facility] LAB;  Service: Cardiovascular;  Laterality: N/A;  . PARATHYROIDECTOMY Right 02/02/2021   Procedure: RIGHT SUPERIOR PARATHYROIDECTOMY;  Surgeon: Armandina Gemma, MD;  Location: [facility];  Service: General;  Laterality: Right;  . POLYPECTOMY  11/18/2021   Procedure:  POLYPECTOMY;  Surgeon: Thornton Park, MD;  Location: [facility];  Service: Gastroenterology;;  . VAGINAL HYSTERECTOMY       Home Medications:  Prior to Admission medications   Medication Sig Start Date End Date Taking? Authorizing Provider  acetaminophen (TYLENOL) 500 MG tablet Take 1,000 mg by mouth every 6 (six) hours as needed for moderate pain.    [provider]  alendronate (FOSAMAX) 70 MG  tablet TAKE 1 TABLET(70 MG) BY MOUTH 1 TIME A WEEK WITH A FULL GLASS OF WATER AND ON AN EMPTY STOMACH 02/17/22   Elayne Snare, MD  atorvastatin (LIPITOR) 80 MG tablet TAKE 1 TABLET(80 MG) BY MOUTH DAILY 11/27/18   Burnell Blanks, MD  carvedilol (COREG) 6.25 MG tablet TAKE 1 TABLET(6.25 MG) BY MOUTH TWICE DAILY. 12/20/21   Burnell Blanks, MD  Cholecalciferol (VITAMIN D3) 50 MCG (2000 UT) TABS Take 2,000 mcg by mouth daily.    [provider]  clopidogrel (PLAVIX) 75 MG tablet TAKE 1 TABLET(75 MG) BY MOUTH DAILY WITH BREAKFAST 12/20/21   Burnell Blanks, MD  furosemide (LASIX) 20 MG tablet TAKE 1 TABLET BY MOUTH EVERY MONDAY, WEDNESDAY, AND FRIDAY. 01/10/22   Burnell Blanks, MD  levothyroxine (SYNTHROID) 25 MCG tablet Take 37.5 mcg by mouth daily before breakfast.    [provider]  losartan (COZAAR) 100 MG tablet TAKE 1 TABLET(100 MG TOTAL) BEFORE Y MOUTH DAILY. 10/19/20   Camnitz, Ocie Doyne, MD  nitroGLYCERIN (NITROSTAT) 0.4 MG SL tablet Place 1 tablet (0.4 mg total) under the tongue every 5 (five) minutes x 3 doses as needed for chest pain. 11/16/21   Camnitz, Ocie Doyne, MD  ranolazine (RANEXA) 1000 MG SR tablet Take 1 tablet (1,000 mg total) by mouth 2 (two) times daily. 01/31/22   Burnell Blanks, MD  valACYclovir (VALTREX) 1000 MG tablet Take 1,000 mg by mouth daily.    [provider]    Inpatient Medications: Scheduled Meds:  Continuous Infusions:  PRN Meds:   Allergies:    Allergies  Allergen Reactions  . Lisinopril Cough    Dry cough  . Spironolactone Other (See Comments)    Low sodium    Social History:   Social History   Socioeconomic History  . Marital status: Married    Spouse name: Not on file  . Number of children: 2  . Years of education: Not on file  . Highest education level: Not on file  Occupational History  . Occupation: Retired-worked for post office  Tobacco Use  . Smoking status: Former     Packs/day: 1.00    Years: 50.00    Total pack years: 50.00    Types: Cigarettes    Quit date: 07/07/2016    Years since quitting: 5.8  . Smokeless tobacco: Never  Vaping Use  . Vaping Use: Never used  Substance and Sexual Activity  . Alcohol use: Not Currently  . Drug use: Never  . Sexual activity: Not Currently  Other Topics Concern  . Not on file  Social History Narrative  . Not on file   Social Determinants of Health   Financial Resource Strain: Not on file  Food Insecurity: Not on file  Transportation Needs: Not on file  Physical Activity: Not on file  Stress: Not on file  Social Connections: Not on file  Intimate Partner Violence: Not on file    Family History:    Family History  Problem Relation Age of Onset  . Kidney failure Mother   .  Heart disease Mother        CHF  . Heart disease Sister   . Colon cancer Neg Hx   . Esophageal cancer Neg Hx   . Rectal cancer Neg Hx   . Stomach cancer Neg Hx      ROS:  Please see the history of present illness.   All other ROS reviewed and negative.     Physical Exam/Data:   Vitals:   04/26/22 2200 04/26/22 2215 04/26/22 2230 04/26/22 2245  BP: (!) 112/59 118/60 115/67 115/67  Pulse: 80 78 78 85  Resp: (!) 26  (!) 33 (!) 26  Temp:      TempSrc:      SpO2: 100% 100% 100% 100%  Weight:      Height:       No intake or output data in the 24 hours ending 04/26/22 2252    04/26/2022    3:01 PM 02/17/2022   10:15 AM 12/20/2021    9:21 AM  Last 3 Weights  Weight (lbs) 120 lb 117 lb 6.4 oz 118 lb 9.6 oz  Weight (kg) 54.432 kg 53.252 kg 53.797 kg     Body mass index is 21.26 kg/m.  General:  Appears frail, some distress, using accessory muscles, on BiPAP HEENT: normal Neck: JVP to angle of mandible Vascular: No carotid bruits; Distal pulses 2+ bilaterally Cardiac:  normal S1, S2; RRR; no murmur  Lungs:  Crackles b/l, wheezing, rhonchi  Abd: soft, nontender, no hepatomegaly  Ext: mild edema in BLE. Cold in  feet but warm in hands Musculoskeletal:  No deformities, BUE and BLE strength normal and equal Skin: warm and dry  Neuro:  CNs 2-12 intact, no focal abnormalities noted Psych:  Normal affect   EKG:  The EKG was personally reviewed and demonstrates:  NSR, septal infarct with non specific ST changes    Relevant CV Studies: CMR and LHC as noted in HPI  Laboratory Data:  High Sensitivity Troponin:   Recent Labs  Lab 04/26/22 1555 04/26/22 1904  TROPONINIHS 184* 1,180*     Chemistry Recent Labs  Lab 04/26/22 1555  NA 126*  K 4.7  CL 90*  CO2 26  GLUCOSE 138*  BUN 13  CREATININE 0.96  CALCIUM 9.0  MG 1.8  GFRNONAA >60  ANIONGAP 10    Recent Labs  Lab 04/26/22 1555  PROT 6.9  ALBUMIN 3.3*  AST 36  ALT 44  ALKPHOS 129*  BILITOT 0.6   Lipids No results for input(s): "CHOL", "TRIG", "HDL", "LABVLDL", "LDLCALC", "CHOLHDL" in the last 168 hours.  Hematology Recent Labs  Lab 04/26/22 1555  WBC 13.2*  RBC 3.58*  HGB 12.6  HCT 35.1*  MCV 98.0  MCH 35.2*  MCHC 35.9  RDW 14.1  PLT 469*   Thyroid No results for input(s): "TSH", "FREET4" in the last 168 hours.  BNP Recent Labs  Lab 04/26/22 1556  BNP 713.3*    DDimer No results for input(s): "DDIMER" in the last 168 hours.   Radiology/Studies:  CT Angio Chest PE W and/or Wo Contrast  Result Date: 04/26/2022 CLINICAL DATA:  Shortness of breath; PE suspected EXAM: CT ANGIOGRAPHY CHEST WITH CONTRAST TECHNIQUE: Multidetector CT imaging of the chest was performed using the standard protocol during bolus administration of intravenous contrast. Multiplanar CT image reconstructions and MIPs were obtained to evaluate the vascular anatomy. RADIATION DOSE REDUCTION: This exam was performed according to the departmental dose-optimization program which includes automated exposure control, adjustment of the mA  and/or kV according to patient size and/or use of iterative reconstruction technique. CONTRAST:  22m OMNIPAQUE  IOHEXOL 350 MG/ML SOLN COMPARISON:  Radiographs earlier today and CT 12/06/2021 FINDINGS: Cardiovascular: Satisfactory opacification of the pulmonary arteries to the segmental level. No evidence of pulmonary embolism. Aortic and coronary artery atherosclerosis. Coronary stenting. Small pericardial effusion. Left chest wall ICD. Mediastinum/Nodes: No enlarged mediastinal, hilar, or axillary lymph nodes. Thyroid gland, trachea, and esophagus demonstrate no significant findings. Lungs/Pleura: Advanced emphysema. Diffuse interlobular septal thickening greatest in the lower lobes. There is more confluence infiltrates in the right middle lobe, lingula and greatest in the posterior lower lobes. Bronchial wall thickening greatest in the lower lobes. Normal variant azygous fissure. Trace bilateral pleural effusions. Upper Abdomen: No acute abnormality though evaluation is compromised by respiratory motion. Musculoskeletal: No chest wall abnormality. No acute osseous findings. Body wall anasarca. Review of the MIP images confirms the above findings. IMPRESSION: 1. No acute pulmonary embolism. 2. Interlobular septal thickening with more confluent opacities in the lower lungs. This is favored to represent infection superimposed on a background of advanced emphysema though pulmonary edema could be considered. 3. Aortic Atherosclerosis (ICD10-I70.0) and Emphysema (ICD10-J43.9). 4. Coronary artery disease/stenting. 5. Small pericardial effusion. Electronically Signed   By: TPlacido SouM.D.   On: 04/26/2022 21:57   DG Chest Portable 1 View  Result Date: 04/26/2022 CLINICAL DATA:  Hypoxia. EXAM: PORTABLE CHEST 1 VIEW COMPARISON:  August 28, 2019 FINDINGS: Cardiac pacemaker in stable position. Calcific atherosclerotic disease and tortuosity of the aorta. Cardiomediastinal silhouette is normal. Mediastinal contours appear intact. Diffuse reticulonodular infiltration of the lungs with lower lobe predominance. Background upper  lobe predominant emphysema. Osseous structures are without acute abnormality. Soft tissues are grossly normal. IMPRESSION: 1. Diffuse reticulonodular infiltration of the lungs with lower lobe predominance, suspicious for atypical pneumonia or interstitial edema with atypical presentation due to underlying chronic lung disease. 2. Background upper lobe predominant emphysema. Electronically Signed   By: DFidela SalisburyM.D.   On: 04/26/2022 15:47     Assessment and Plan:   ***   Risk Assessment/Risk Scores:  {Complete the following score calculators/questions to meet required metrics.  Press F2         :[id number]}  {Is the patient being seen for unstable angina, ACS, NSTEMI or STEMI?:[phone]} {Does this patient have CHF or CHF symptoms?      :[id number]}{Does this patient have ATRIAL FIBRILLATION?:[id number]}  {Are we signing off today?:[id number]}  For questions or updates, please contact [place] [email or web address] for contact info under    Signed, JMargurite Auerbach MD  04/26/2022 10:52 PM

## 2022-04-27 ENCOUNTER — Encounter (HOSPITAL_COMMUNITY): Payer: Self-pay | Admitting: Family Medicine

## 2022-04-27 ENCOUNTER — Inpatient Hospital Stay (HOSPITAL_COMMUNITY): Payer: Medicare Other

## 2022-04-27 DIAGNOSIS — D75839 Thrombocytosis, unspecified: Secondary | ICD-10-CM

## 2022-04-27 DIAGNOSIS — A419 Sepsis, unspecified organism: Secondary | ICD-10-CM

## 2022-04-27 DIAGNOSIS — R778 Other specified abnormalities of plasma proteins: Secondary | ICD-10-CM

## 2022-04-27 DIAGNOSIS — J9601 Acute respiratory failure with hypoxia: Secondary | ICD-10-CM | POA: Diagnosis not present

## 2022-04-27 DIAGNOSIS — J189 Pneumonia, unspecified organism: Secondary | ICD-10-CM

## 2022-04-27 DIAGNOSIS — I5023 Acute on chronic systolic (congestive) heart failure: Secondary | ICD-10-CM | POA: Diagnosis not present

## 2022-04-27 DIAGNOSIS — R7989 Other specified abnormal findings of blood chemistry: Secondary | ICD-10-CM

## 2022-04-27 LAB — I-STAT VENOUS BLOOD GAS, ED
Acid-Base Excess: 2 mmol/L (ref 0.0–2.0)
Bicarbonate: 26 mmol/L (ref 20.0–28.0)
Calcium, Ion: 1.12 mmol/L — ABNORMAL LOW (ref 1.15–1.40)
HCT: 34 % — ABNORMAL LOW (ref 36.0–46.0)
Hemoglobin: 11.6 g/dL — ABNORMAL LOW (ref 12.0–15.0)
O2 Saturation: 78 %
Potassium: 3.8 mmol/L (ref 3.5–5.1)
Sodium: 129 mmol/L — ABNORMAL LOW (ref 135–145)
TCO2: 27 mmol/L (ref 22–32)
pCO2, Ven: 35.9 mmHg — ABNORMAL LOW (ref 44–60)
pH, Ven: 7.468 — ABNORMAL HIGH (ref 7.25–7.43)
pO2, Ven: 39 mmHg (ref 32–45)

## 2022-04-27 LAB — BASIC METABOLIC PANEL
Anion gap: 17 — ABNORMAL HIGH (ref 5–15)
BUN: 14 mg/dL (ref 8–23)
CO2: 18 mmol/L — ABNORMAL LOW (ref 22–32)
Calcium: 8.9 mg/dL (ref 8.9–10.3)
Chloride: 96 mmol/L — ABNORMAL LOW (ref 98–111)
Creatinine, Ser: 0.91 mg/dL (ref 0.44–1.00)
GFR, Estimated: >60 mL/min (ref >60)
Glucose, Bld: 93 mg/dL (ref 70–99)
Potassium: 4.4 mmol/L (ref 3.5–5.1)
Sodium: 131 mmol/L — ABNORMAL LOW (ref 135–145)

## 2022-04-27 LAB — CBC
HCT: 34.3 % — ABNORMAL LOW (ref 36.0–46.0)
Hemoglobin: 11.6 g/dL — ABNORMAL LOW (ref 12.0–15.0)
MCH: 35.5 pg — ABNORMAL HIGH (ref 26.0–34.0)
MCHC: 33.8 g/dL (ref 30.0–36.0)
MCV: 104.9 fL — ABNORMAL HIGH (ref 80.0–100.0)
Platelets: 296 10*3/uL (ref 150–400)
RBC: 3.27 MIL/uL — ABNORMAL LOW (ref 3.87–5.11)
RDW: 14.2 % (ref 11.5–15.5)
WBC: 11 10*3/uL — ABNORMAL HIGH (ref 4.0–10.5)
nRBC: 0 % (ref 0.0–0.2)

## 2022-04-27 LAB — TROPONIN I (HIGH SENSITIVITY): Troponin I (High Sensitivity): 938 ng/L (ref <18)

## 2022-04-27 LAB — ECHOCARDIOGRAM COMPLETE
Area-P 1/2: 3.72 cm2
Calc EF: 30.4 %
Height: 63 in
Single Plane A2C EF: 37.9 %
Single Plane A4C EF: 21.7 %
Weight: 1918.88 oz

## 2022-04-27 LAB — HEPARIN LEVEL (UNFRACTIONATED): Heparin Unfractionated: 0.1 IU/mL — ABNORMAL LOW (ref 0.30–0.70)

## 2022-04-27 LAB — MRSA NEXT GEN BY PCR, NASAL: MRSA by PCR Next Gen: NOT DETECTED

## 2022-04-27 MED ORDER — METHYLPREDNISOLONE SODIUM SUCC 125 MG IJ SOLR
60.0000 mg | Freq: Two times a day (BID) | INTRAMUSCULAR | Status: DC
  Administered 2022-04-27 – 2022-04-29 (×5): 60 mg via INTRAVENOUS
  Filled 2022-04-27 (×5): qty 2

## 2022-04-27 MED ORDER — ATORVASTATIN CALCIUM 80 MG PO TABS
80.0000 mg | ORAL_TABLET | Freq: Every day | ORAL | Status: DC
  Administered 2022-04-27 – 2022-05-02 (×5): 80 mg via ORAL
  Filled 2022-04-27: qty 2
  Filled 2022-04-27 (×4): qty 1

## 2022-04-27 MED ORDER — RANOLAZINE ER 500 MG PO TB12
1000.0000 mg | ORAL_TABLET | Freq: Two times a day (BID) | ORAL | Status: DC
  Administered 2022-04-27 – 2022-05-02 (×10): 1000 mg via ORAL
  Filled 2022-04-27 (×11): qty 2

## 2022-04-27 MED ORDER — GUAIFENESIN ER 600 MG PO TB12
600.0000 mg | ORAL_TABLET | Freq: Two times a day (BID) | ORAL | Status: DC
  Administered 2022-04-27 – 2022-05-02 (×10): 600 mg via ORAL
  Filled 2022-04-27 (×10): qty 1

## 2022-04-27 MED ORDER — LEVOTHYROXINE SODIUM 75 MCG PO TABS
37.5000 ug | ORAL_TABLET | Freq: Every day | ORAL | Status: DC
  Administered 2022-04-27 – 2022-05-02 (×6): 37.5 ug via ORAL
  Filled 2022-04-27 (×6): qty 1

## 2022-04-27 MED ORDER — ACETAMINOPHEN 650 MG RE SUPP: 650.0000 mg | RECTAL | Status: DC | PRN

## 2022-04-27 MED ORDER — ACETAMINOPHEN 325 MG PO TABS: 650.0000 mg | ORAL_TABLET | Freq: Four times a day (QID) | ORAL | Status: DC | PRN

## 2022-04-27 MED ORDER — SODIUM CHLORIDE 0.9 % IV SOLN
500.0000 mg | INTRAVENOUS | Status: DC
  Administered 2022-04-27 – 2022-04-29 (×3): 500 mg via INTRAVENOUS
  Filled 2022-04-27 (×3): qty 5

## 2022-04-27 MED ORDER — ACETAMINOPHEN 325 MG PO TABS
650.0000 mg | ORAL_TABLET | Freq: Four times a day (QID) | ORAL | Status: DC | PRN
  Administered 2022-04-27 – 2022-05-01 (×7): 650 mg via ORAL
  Filled 2022-04-27 (×9): qty 2

## 2022-04-27 MED ORDER — HEPARIN BOLUS VIA INFUSION
1600.0000 [IU] | Freq: Once | INTRAVENOUS | Status: AC
  Administered 2022-04-27: 1600 [IU] via INTRAVENOUS
  Filled 2022-04-27: qty 1600

## 2022-04-27 MED ORDER — ENOXAPARIN SODIUM 30 MG/0.3ML IJ SOSY: 30.0000 mg | PREFILLED_SYRINGE | INTRAMUSCULAR | Status: DC

## 2022-04-27 MED ORDER — CLOPIDOGREL BISULFATE 75 MG PO TABS
75.0000 mg | ORAL_TABLET | Freq: Every day | ORAL | Status: DC
  Administered 2022-04-27 – 2022-05-02 (×6): 75 mg via ORAL
  Filled 2022-04-27 (×6): qty 1

## 2022-04-27 MED ORDER — SODIUM CHLORIDE 0.9 % IV SOLN
1.0000 g | INTRAVENOUS | Status: DC
  Administered 2022-04-27 – 2022-04-29 (×3): 1 g via INTRAVENOUS
  Filled 2022-04-27 (×3): qty 10

## 2022-04-27 MED ORDER — HEPARIN BOLUS VIA INFUSION
3250.0000 [IU] | Freq: Once | INTRAVENOUS | Status: AC
  Administered 2022-04-27: 3250 [IU] via INTRAVENOUS
  Filled 2022-04-27: qty 3250

## 2022-04-27 MED ORDER — FUROSEMIDE 10 MG/ML IJ SOLN
40.0000 mg | Freq: Two times a day (BID) | INTRAMUSCULAR | Status: AC
  Administered 2022-04-27 – 2022-04-28 (×2): 40 mg via INTRAVENOUS
  Filled 2022-04-27 (×3): qty 4

## 2022-04-27 MED ORDER — HEPARIN (PORCINE) 25000 UT/250ML-% IV SOLN
900.0000 [IU]/h | INTRAVENOUS | Status: DC
  Administered 2022-04-27: 650 [IU]/h via INTRAVENOUS
  Administered 2022-04-28: 800 [IU]/h via INTRAVENOUS
  Filled 2022-04-27 (×2): qty 250

## 2022-04-27 MED ORDER — PERFLUTREN LIPID MICROSPHERE
1.0000 mL | INTRAVENOUS | Status: AC | PRN
  Administered 2022-04-27: 2 mL via INTRAVENOUS

## 2022-04-27 MED ORDER — IPRATROPIUM-ALBUTEROL 0.5-2.5 (3) MG/3ML IN SOLN
3.0000 mL | RESPIRATORY_TRACT | Status: DC | PRN
  Administered 2022-04-27: 3 mL via RESPIRATORY_TRACT
  Filled 2022-04-27: qty 3

## 2022-04-27 MED ORDER — FUROSEMIDE 10 MG/ML IJ SOLN: 20.0000 mg | Freq: Every day | INTRAMUSCULAR | Status: DC

## 2022-04-27 NOTE — Assessment & Plan Note (Signed)
Continue home levothyroxine when able to tolerate p.o.

## 2022-04-27 NOTE — ED Notes (Signed)
Echo Technician at the bedside

## 2022-04-27 NOTE — Assessment & Plan Note (Signed)
On IV Lasix. Cardiology recommended holding Losartan.

## 2022-04-27 NOTE — ED Notes (Signed)
RT at bedside assessing pt respiratory status.   Pt switched to nasal canula 1.5L '@95'$ %

## 2022-04-27 NOTE — Progress Notes (Signed)
ANTICOAGULATION CONSULT NOTE - Initial Consult  Pharmacy Consult for heparin Indication: chest pain/ACS  Allergies  Allergen Reactions   Lisinopril Cough    Dry cough   Spironolactone Other (See Comments)    Low sodium    Patient Measurements: Height: '5\' 3"'$  (160 cm) Weight: 54.4 kg (119 lb 14.9 oz) IBW/kg (Calculated) : 52.4 Heparin Dosing Weight: 54.4kg  Vital Signs: Temp: 98.6 F (37 C) (09/27 1409) Temp Source: Oral (09/27 1409) BP: 118/68 (09/27 1430) Pulse Rate: 84 (09/27 1430)  Labs: Recent Labs    04/26/22 1555 04/26/22 1904 04/27/22 0252 04/27/22 0303 04/27/22 1600  HGB 12.6  --  11.6* 11.6*  --   HCT 35.1*  --  34.3* 34.0*  --   PLT 469*  --  296  --   --   HEPARINUNFRC  --   --   --   --  0.10*  CREATININE 0.96  --  0.91  --   --   TROPONINIHS 184* 1,180* 938*  --   --      Estimated Creatinine Clearance: 47.6 mL/min (by C-G formula based on SCr of 0.91 mg/dL).   Medical History: Past Medical History:  Diagnosis Date   Abnormal EKG    AICD (automatic cardioverter/defibrillator) present    Alcoholic intoxication without complication (HCC)    Allergy    Cardiomyopathy, ischemic    Cataract    Chest pain 08/29/2016   CHF (congestive heart failure) (HCC)    Chronic systolic heart failure ([place]) 08/29/2016   Facial laceration    Fall 07/07/2016   Hyperlipidemia    Hypertension 03/03/2017   Hyponatremia    Hypothyroidism 07/08/2016   NSTEMI (non-ST elevated myocardial infarction) ([place])    Syncope    Thyroid disease     Medications:  Scheduled:   atorvastatin  80 mg Oral Daily   clopidogrel  75 mg Oral Daily   furosemide  40 mg Intravenous BID   guaiFENesin  600 mg Oral BID   levothyroxine  37.5 mcg Oral QAC breakfast   methylPREDNISolone (SOLU-MEDROL) injection  60 mg Intravenous Q12H   ranolazine  1,000 mg Oral BID   Assessment: 47 yoF with PMH of CAD (NSTEMI 2017), ICM, HFrEF s/p [place]-ICD, HTN, HLD, hypothyoridism who presented with  shortness of breath. Pharmacy consulted to dose heparin for ACS/NSTEMI. Troponins elevated to 938, Hgb 11.6, platelets WNL. No PTA anticoagulation  9/27 PM HL 0.10- subtherapeutic   Goal of Therapy:  Heparin level 0.3-0.7 units/ml Monitor platelets by anticoagulation protocol: Yes   Plan:  Give 1600 units bolus x 1 Increase heparin infusion to 800 units/hr Check anti-Xa level in 8 hours and daily while on heparin Continue to monitor H&H and platelets  Modesta Sammons BS, PharmD, BCPS Clinical Pharmacist 04/27/2022 5:13 PM  Contact: [phone] after 3 PM  "Be curious, not judgmental..." -Jamal Maes

## 2022-04-27 NOTE — ED Notes (Signed)
RN assisted with changing underwear, linens, and applied sacrum foam.   Pt stated that during her last stay she had a pressure injury on her bottom. Pt sacrum is red with minor skin tear. Sacrum foam applied and pt repositioned.

## 2022-04-27 NOTE — Assessment & Plan Note (Signed)
Presented with fever, tachypnea and leukocytosis from community acquired pneumonia -Continue IV Rocephin and azithromycin -Unable to receive sepsis fluids since she also has an CHF exacerbation

## 2022-04-27 NOTE — Assessment & Plan Note (Addendum)
-  Requiring Bipap on presentation -multifactorial from community acquired pneumonia, CHF exac, and severe emphysema -continue IV rocephin and Azithromycin -continue IV diuresis as below and follow intake and out  -duonebs PRN -will obtain VBG -pt is ill appearing and has guarded prognosis overnight. She is agreeable to intubation/MV if her condition worsens.

## 2022-04-27 NOTE — Assessment & Plan Note (Signed)
Platelet elevated to 468.  Likely reactive to infection.  Continue to monitor.

## 2022-04-27 NOTE — H&P (Signed)
History and Physical    Patient: Pam [name] WUX:[id number] DOB: Jul 21, 1951 DOA: 04/26/2022 DOS: the patient was seen and examined on 04/27/2022 PCP: Loraine Leriche., MD  Patient coming from: Home  Chief Complaint:  Chief Complaint  Patient presents with   Shortness of Breath   HPI: Pam [name] is a 71 y.o. female with medical history significant of CAD s.p stent, ischemic cardiomyopathy s/p ICD, chronic systolic CHF, HTN, HLD, hypothyroidism, osteoporosis who presents with increasing shortness of breath.   For the past two weeks had occasional productive cough and has been feeling hot intermittently. Started to have increasing shortness of breath at rest and with exertion. Today became much worse prompting her to come in. Only has chest pain with cough. No LE edema. No nausea or vomiting.  May be some diarrhea.  In the ED she was febrile up to 100.5 F, tachypneic requiring BiPAP.  WBC of 13.2.  Hemoglobin of 12.6.  Platelet of 469. Sodium of 126, K of 4.7, CBG of 138.  BNP elevated to 713, troponin of 184 trended up to 1180. Negative COVID PCR.  Chest x-ray shows diffuse reticulonodular infiltrates along with lower lobe predominance suspicious for atypical pneumonia or interstitial edema with underlying chronic lung disease.  CTA chest was negative for PE.  Small pericardial effusion.  In the lungs there is advanced emphysema, interlobar septal thickening.  Infiltrate in the right middle lower lobe.  Trace bilateral pleural effusion.  She was started on IV Rocephin, azithromycin and given 20 mg Lasix dose in the ED.  Cardiology also consulted to see in evaluation. Review of Systems: As mentioned in the history of present illness. All other systems reviewed and are negative. Past Medical History:  Diagnosis Date   Abnormal EKG    AICD (automatic cardioverter/defibrillator) present    Alcoholic intoxication without complication ([place])    Allergy    Cardiomyopathy, ischemic     Cataract    Chest pain 08/29/2016   CHF (congestive heart failure) ([place])    Chronic systolic heart failure ([place]) 08/29/2016   Facial laceration    Fall 07/07/2016   Hyperlipidemia    Hypertension 03/03/2017   Hyponatremia    Hypothyroidism 07/08/2016   NSTEMI (non-ST elevated myocardial infarction) [facility])    Syncope    Thyroid disease    Past Surgical History:  Procedure Laterality Date   CARDIAC CATHETERIZATION N/A 07/08/2016   Procedure: Left Heart Cath and Coronary Angiography;  Surgeon: [name], MD;  Location: [place] [facility];  Service: Cardiovascular;  Laterality: N/A;   CARDIAC CATHETERIZATION N/A 08/29/2016   Procedure: Coronary Atherectomy;  Surgeon: [name], MD;  Location: [place] [facility];  Service: Cardiovascular;  Laterality: N/A;   CARDIAC CATHETERIZATION N/A 08/29/2016   Procedure: Coronary Stent Intervention;  Surgeon: [name], MD;  Location: [place] [facility];  Service: Cardiovascular;  Laterality: N/A;   CATARACT EXTRACTION, BILATERAL     COLONOSCOPY WITH PROPOFOL N/A 11/18/2021   Procedure: COLONOSCOPY WITH PROPOFOL;  Surgeon: Thornton Park, MD;  Location: [facility];  Service: Gastroenterology;  Laterality: N/A;   CORONARY ANGIOPLASTY     coronary stents      ICD IMPLANT N/A 08/28/2019   Procedure: ICD IMPLANT;  Surgeon: Constance Haw, MD;  Location: [place] [facility];  Service: Cardiovascular;  Laterality: N/A;   PARATHYROIDECTOMY Right 02/02/2021   Procedure: RIGHT SUPERIOR PARATHYROIDECTOMY;  Surgeon: Armandina Gemma, MD;  Location: [facility];  Service: General;  Laterality: Right;   POLYPECTOMY  11/18/2021   Procedure: POLYPECTOMY;  Surgeon: Thornton Park, MD;  Location: [facility];  Service: Gastroenterology;;   VAGINAL HYSTERECTOMY     Social History:  reports that she quit smoking about 5 years ago. Her smoking use included cigarettes. She has a 50.00 pack-year smoking history. She has never used smokeless tobacco.  She reports that she does not currently use alcohol. She reports that she does not use drugs.  Allergies  Allergen Reactions   Lisinopril Cough    Dry cough   Spironolactone Other (See Comments)    Low sodium    Family History  Problem Relation Age of Onset   Kidney failure Mother    Heart disease Mother        CHF   Heart disease Sister    Colon cancer Neg Hx    Esophageal cancer Neg Hx    Rectal cancer Neg Hx    Stomach cancer Neg Hx     Prior to Admission medications   Medication Sig Start Date End Date Taking? Authorizing Provider  acetaminophen (TYLENOL) 500 MG tablet Take 1,000 mg by mouth every 6 (six) hours as needed for moderate pain.    [provider]  alendronate (FOSAMAX) 70 MG tablet TAKE 1 TABLET(70 MG) BY MOUTH 1 TIME A WEEK WITH A FULL GLASS OF WATER AND ON AN EMPTY STOMACH 02/17/22   Elayne Snare, MD  atorvastatin (LIPITOR) 80 MG tablet TAKE 1 TABLET(80 MG) BY MOUTH DAILY 11/27/18   Burnell Blanks, MD  carvedilol (COREG) 6.25 MG tablet TAKE 1 TABLET(6.25 MG) BY MOUTH TWICE DAILY. 12/20/21   Burnell Blanks, MD  Cholecalciferol (VITAMIN D3) 50 MCG (2000 UT) TABS Take 2,000 mcg by mouth daily.    [provider]  clopidogrel (PLAVIX) 75 MG tablet TAKE 1 TABLET(75 MG) BY MOUTH DAILY WITH BREAKFAST 12/20/21   Burnell Blanks, MD  furosemide (LASIX) 20 MG tablet TAKE 1 TABLET BY MOUTH EVERY MONDAY, WEDNESDAY, AND FRIDAY. 01/10/22   Burnell Blanks, MD  levothyroxine (SYNTHROID) 25 MCG tablet Take 37.5 mcg by mouth daily before breakfast.    [provider]  losartan (COZAAR) 100 MG tablet TAKE 1 TABLET(100 MG TOTAL) BEFORE Y MOUTH DAILY. 10/19/20   Camnitz, Ocie Doyne, MD  nitroGLYCERIN (NITROSTAT) 0.4 MG SL tablet Place 1 tablet (0.4 mg total) under the tongue every 5 (five) minutes x 3 doses as needed for chest pain. 11/16/21   Camnitz, Ocie Doyne, MD  ranolazine (RANEXA) 1000 MG SR tablet Take 1 tablet (1,000 mg  total) by mouth 2 (two) times daily. 01/31/22   Burnell Blanks, MD  valACYclovir (VALTREX) 1000 MG tablet Take 1,000 mg by mouth daily.    [provider]    Physical Exam: Vitals:   04/26/22 2230 04/26/22 2245 04/26/22 2302 04/27/22 0053  BP: 115/67 115/67 115/67   Pulse: 78 85 88   Resp: (!) 33 (!) 26 (!) 33   Temp:    98.8 F (37.1 C)  TempSrc:      SpO2: 100% 100% 100%   Weight:      Height:       Constitutional: NAD, ill-appearing elderly female sitting upright in bed on BiPAP.  Patient with frequent dry heaving cough and labored respirations. Eyes: lids and conjunctivae normal ENMT: Mucous membranes are moist. Neck: normal, supple, no masses, no thyromegaly Respiratory: Rhonchorous lung sounds throughout with expiratory wheezing.  Patient on BiPAP with frequent nonproductive cough causing significant labored respirations.  Cardiovascular:  Regular rate and rhythm, no murmurs / rubs / gallops. No extremity edema.  Abdomen: Soft, nontender nondistended ,bowel sounds positive.  Musculoskeletal: no clubbing / cyanosis. No joint deformity upper and lower extremities. Good ROM, no contractures. Normal muscle tone.  Skin: no rashes, lesions, ulcers.  Neurologic: CN 2-12 grossly intact.  Strength 5/5 in all 4.  Psychiatric: Normal judgment and insight. Alert and oriented x 3. Normal mood. Data Reviewed:  See HPI  Assessment and Plan: * Acute respiratory failure with hypoxia ([place]) -Requiring Bipap on presentation -multifactorial from community acquired pneumonia, CHF exac, and severe emphysema -continue IV rocephin and Azithromycin -continue IV diuresis as below and follow intake and out  -duonebs PRN -will obtain VBG -pt is ill appearing and has guarded prognosis overnight. She is agreeable to intubation/MV if her condition worsens.  Thrombocytosis Platelet elevated to 468.  Likely reactive to infection.  Continue to monitor.  Sepsis ([place]) Presented with  fever, tachypnea and leukocytosis from community acquired pneumonia -Continue IV Rocephin and azithromycin -Unable to receive sepsis fluids since she also has an CHF exacerbation  Elevated troponin Troponin of 184-->1180 -continue to trend and cardiology advised placing on heparin infusion if there is exponential trend but otherwise suspect demand ischemia. Pt denies any chest pain.  Acute on chronic systolic CHF (congestive heart failure) ([place]) Last echocardiogram on 06/2019 with EF of 25 to 30% with mid and apical segments severely hypokinetic. -Patient on home regimen of 20 mg Lasix 3 times a week -Continue with IV '20mg'$  daily with strict intake and output -repeat echocardiogram -Hold home Coreg and losartan per cardiology  Essential hypertension On IV Lasix. Cardiology recommended holding Losartan.  Hyponatremia Has baseline chronic hyponatremia around 130s.  Presented with hyponatremia of 126.  Currently being diuresed.  We will follow repeat sodium.  Hypothyroidism Continue home levothyroxine when able to tolerate p.o.      Advance Care Planning:   Code Status: Full Code   Consults: cardiology  Family Communication: Discussed with husband at bedside  Severity of Illness: The appropriate patient status for this patient is INPATIENT. Inpatient status is judged to be reasonable and necessary in order to provide the required intensity of service to ensure the patient's safety. The patient's presenting symptoms, physical exam findings, and initial radiographic and laboratory data in the context of their chronic comorbidities is felt to place them at high risk for further clinical deterioration. Furthermore, it is not anticipated that the patient will be medically stable for discharge from the hospital within 2 midnights of admission.   * I certify that at the point of admission it is my clinical judgment that the patient will require inpatient hospital care spanning beyond 2  midnights from the point of admission due to high intensity of service, high risk for further deterioration and high frequency of surveillance required.*  Author: Orene Desanctis, DO 04/27/2022 1:23 AM  For on call review [email or web address].

## 2022-04-27 NOTE — Progress Notes (Signed)
ANTICOAGULATION CONSULT NOTE - Initial Consult  Pharmacy Consult for heparin Indication: chest pain/ACS  Allergies  Allergen Reactions   Lisinopril Cough    Dry cough   Spironolactone Other (See Comments)    Low sodium    Patient Measurements: Height: '5\' 3"'$  (160 cm) Weight: 54.4 kg (120 lb) IBW/kg (Calculated) : 52.4 Heparin Dosing Weight: 54.4kg  Vital Signs: Temp: 98.7 F (37.1 C) (09/27 0457) Temp Source: Oral (09/27 0457) BP: 117/61 (09/27 0730) Pulse Rate: 73 (09/27 0730)  Labs: Recent Labs    04/26/22 1555 04/26/22 1904 04/27/22 0252 04/27/22 0303  HGB 12.6  --  11.6* 11.6*  HCT 35.1*  --  34.3* 34.0*  PLT 469*  --  296  --   CREATININE 0.96  --  0.91  --   TROPONINIHS 184* 1,180* 938*  --     Estimated Creatinine Clearance: 47.6 mL/min (by C-G formula based on SCr of 0.91 mg/dL).   Medical History: Past Medical History:  Diagnosis Date   Abnormal EKG    AICD (automatic cardioverter/defibrillator) present    Alcoholic intoxication without complication (HCC)    Allergy    Cardiomyopathy, ischemic    Cataract    Chest pain 08/29/2016   CHF (congestive heart failure) (HCC)    Chronic systolic heart failure ([place]) 08/29/2016   Facial laceration    Fall 07/07/2016   Hyperlipidemia    Hypertension 03/03/2017   Hyponatremia    Hypothyroidism 07/08/2016   NSTEMI (non-ST elevated myocardial infarction) ([place])    Syncope    Thyroid disease     Medications:  Scheduled:   atorvastatin  80 mg Oral Daily   clopidogrel  75 mg Oral Daily   enoxaparin (LOVENOX) injection  30 mg Subcutaneous Q24H   furosemide  40 mg Intravenous BID   levothyroxine  37.5 mcg Oral QAC breakfast   methylPREDNISolone (SOLU-MEDROL) injection  60 mg Intravenous Q12H   ranolazine  1,000 mg Oral BID   Assessment: 17 yoF with PMH of CAD (NSTEMI 2017), ICM, HFrEF s/p [place]-ICD, HTN, HLD, hypothyoridism who presented with shortness of breath. Pharmacy consulted to dose heparin for  ACS/NSTEMI. Troponins elevated to 938, Hgb 11.6, platelets WNL. No PTA anticoagulation  Goal of Therapy:  Heparin level 0.3-0.7 units/ml Monitor platelets by anticoagulation protocol: Yes   Plan:  Give 3250 units bolus x 1 Start heparin infusion at 650 units/hr Check anti-Xa level in 8 hours and daily while on heparin Continue to monitor H&H and platelets  Sandford Craze, PharmD. [facility] Acute Care PGY-1  04/27/2022 8:05 AM

## 2022-04-27 NOTE — Progress Notes (Signed)
Patient transported from ED Room 18 to 5I77 with no complications noted.

## 2022-04-27 NOTE — Progress Notes (Addendum)
Heart Failure Navigator Progress Note  Following this hospitalization to assess for HV TOC readiness.   ECHO 25-30% G2DD  Plan for Left Heart cath this admission.  Last 2020 was 25-30%  Pam [name], BSN, Clinical cytogeneticist Only

## 2022-04-27 NOTE — Progress Notes (Signed)
RT removed BIPAP from patient at [place]. Patient has persistent non productive cough. Coarse breath sounds bilaterally. Currently on 2L [place]. RT gave 1 duoneb tx at this time. No change noted. RR 28. Spo2 96%. Call bell within reach and BIPAP at bedside on standby. RN is aware. RT instructed pt to use call bell if she feels the need to go back on BIPAP. RT will continue to monitor.

## 2022-04-27 NOTE — Progress Notes (Addendum)
Rounding Note    Patient Name: Pam [name] Date of Encounter: 04/27/2022  [place] Cardiologist: Lauree Chandler, MD   Subjective   Patient continues to have SOB requiring Bipap and an ongoing cough. She also complains of chest tightness located in the middle of her chest. Worse with coughing. Chest tightness started yesterday and was very intense, patient tried taking 2 SL nitro without improvement. However, chest tightness did improve with a third SL nitro given by EMS last night.   Inpatient Medications    Scheduled Meds:  atorvastatin  80 mg Oral Daily   clopidogrel  75 mg Oral Daily   enoxaparin (LOVENOX) injection  30 mg Subcutaneous Q24H   furosemide  20 mg Intravenous Daily   levothyroxine  37.5 mcg Oral QAC breakfast   ranolazine  1,000 mg Oral BID   Continuous Infusions:  azithromycin     cefTRIAXone (ROCEPHIN)  IV     PRN Meds: acetaminophen **OR** acetaminophen, ipratropium-albuterol   Vital Signs    Vitals:   04/27/22 0230 04/27/22 0313 04/27/22 0457 04/27/22 0545  BP: 110/71 (!) 104/53  117/62  Pulse: 68 75  73  Resp: 18 (!) 30  (!) 30  Temp:   98.7 F (37.1 C)   TempSrc:   Oral   SpO2: 100%   100%  Weight:      Height:       No intake or output data in the 24 hours ending 04/27/22 0701    04/26/2022    3:01 PM 02/17/2022   10:15 AM 12/20/2021    9:21 AM  Last 3 Weights  Weight (lbs) 120 lb 117 lb 6.4 oz 118 lb 9.6 oz  Weight (kg) 54.432 kg 53.252 kg 53.797 kg      Telemetry    Sinus rhythm - Personally Reviewed  ECG    Normal sinus rhythm, HR 99 BPM, inverted T waves in the lateral leads (also noted on past EKG from 10/2021)   - Personally Reviewed  Physical Exam   GEN: Elderly female laying in the bed with head elevated. Wearing bi-pap. Coughs multiple times during evaluation  Neck: No JVD Cardiac: RRR, no murmurs, rubs, or gallops.  Respiratory: Inspiratory and expiratory wheezing throughout, crackles in bilateral  lung bases. Wearning bipap GI: Soft, non-distended. Mildly tender to palpation  MS: No edema; No deformity. Neuro:  Nonfocal  Psych: Normal affect   Labs    High Sensitivity Troponin:   Recent Labs  Lab 04/26/22 1555 04/26/22 1904 04/27/22 0252  TROPONINIHS 184* 1,180* 938*     Chemistry Recent Labs  Lab 04/26/22 1555 04/27/22 0252 04/27/22 0303  NA 126* 131* 129*  K 4.7 4.4 3.8  CL 90* 96*  --   CO2 26 18*  --   GLUCOSE 138* 93  --   BUN 13 14  --   CREATININE 0.96 0.91  --   CALCIUM 9.0 8.9  --   MG 1.8  --   --   PROT 6.9  --   --   ALBUMIN 3.3*  --   --   AST 36  --   --   ALT 44  --   --   ALKPHOS 129*  --   --   BILITOT 0.6  --   --   GFRNONAA >60 >60  --   ANIONGAP 10 17*  --     Lipids No results for input(s): "CHOL", "TRIG", "HDL", "LABVLDL", "LDLCALC", "CHOLHDL" in the last 168  hours.  Hematology Recent Labs  Lab 04/26/22 1555 04/27/22 0252 04/27/22 0303  WBC 13.2* 11.0*  --   RBC 3.58* 3.27*  --   HGB 12.6 11.6* 11.6*  HCT 35.1* 34.3* 34.0*  MCV 98.0 104.9*  --   MCH 35.2* 35.5*  --   MCHC 35.9 33.8  --   RDW 14.1 14.2  --   PLT 469* 296  --    Thyroid No results for input(s): "TSH", "FREET4" in the last 168 hours.  BNP Recent Labs  Lab 04/26/22 1556  BNP 713.3*    DDimer No results for input(s): "DDIMER" in the last 168 hours.   Radiology    CT Angio Chest PE W and/or Wo Contrast  Result Date: 04/26/2022 CLINICAL DATA:  Shortness of breath; PE suspected EXAM: CT ANGIOGRAPHY CHEST WITH CONTRAST TECHNIQUE: Multidetector CT imaging of the chest was performed using the standard protocol during bolus administration of intravenous contrast. Multiplanar CT image reconstructions and MIPs were obtained to evaluate the vascular anatomy. RADIATION DOSE REDUCTION: This exam was performed according to the departmental dose-optimization program which includes automated exposure control, adjustment of the mA and/or kV according to patient size and/or  use of iterative reconstruction technique. CONTRAST:  2m OMNIPAQUE IOHEXOL 350 MG/ML SOLN COMPARISON:  Radiographs earlier today and CT 12/06/2021 FINDINGS: Cardiovascular: Satisfactory opacification of the pulmonary arteries to the segmental level. No evidence of pulmonary embolism. Aortic and coronary artery atherosclerosis. Coronary stenting. Small pericardial effusion. Left chest wall ICD. Mediastinum/Nodes: No enlarged mediastinal, hilar, or axillary lymph nodes. Thyroid gland, trachea, and esophagus demonstrate no significant findings. Lungs/Pleura: Advanced emphysema. Diffuse interlobular septal thickening greatest in the lower lobes. There is more confluence infiltrates in the right middle lobe, lingula and greatest in the posterior lower lobes. Bronchial wall thickening greatest in the lower lobes. Normal variant azygous fissure. Trace bilateral pleural effusions. Upper Abdomen: No acute abnormality though evaluation is compromised by respiratory motion. Musculoskeletal: No chest wall abnormality. No acute osseous findings. Body wall anasarca. Review of the MIP images confirms the above findings. IMPRESSION: 1. No acute pulmonary embolism. 2. Interlobular septal thickening with more confluent opacities in the lower lungs. This is favored to represent infection superimposed on a background of advanced emphysema though pulmonary edema could be considered. 3. Aortic Atherosclerosis (ICD10-I70.0) and Emphysema (ICD10-J43.9). 4. Coronary artery disease/stenting. 5. Small pericardial effusion. Electronically Signed   By: TPlacido SouM.D.   On: 04/26/2022 21:57   DG Chest Portable 1 View  Result Date: 04/26/2022 CLINICAL DATA:  Hypoxia. EXAM: PORTABLE CHEST 1 VIEW COMPARISON:  August 28, 2019 FINDINGS: Cardiac pacemaker in stable position. Calcific atherosclerotic disease and tortuosity of the aorta. Cardiomediastinal silhouette is normal. Mediastinal contours appear intact. Diffuse reticulonodular  infiltration of the lungs with lower lobe predominance. Background upper lobe predominant emphysema. Osseous structures are without acute abnormality. Soft tissues are grossly normal. IMPRESSION: 1. Diffuse reticulonodular infiltration of the lungs with lower lobe predominance, suspicious for atypical pneumonia or interstitial edema with atypical presentation due to underlying chronic lung disease. 2. Background upper lobe predominant emphysema. Electronically Signed   By: DFidela SalisburyM.D.   On: 04/26/2022 15:47    Cardiac Studies     Patient Profile     71y.o. female   Assessment & Plan    Acute Respiratory Failure with Hypoxia  - Multifactorial-- severe emphysema, CAP, CHF exacerbation  - Requiring bipap  - on IV antibiotics, solu-medrol injections. Has PRN duoneb treatments ordered  - Recommend  pulmonology consultation  Acute on Chronic Systolic Heart Failure  Ischemic Cardiomyopathy  - Most recent echocardiogram was completed in 06/2019- showed EF 25-30%, no LVH, severely dilated LV internal cavity size, normal RV systolic function  - Echo this admission pending - Patient complaining of SOB, nasal congestion, cough, fever. CXR  suspicious for atypical pneumonia or interstitial edema with atypical presentation due to underlying chronic lung disease, predominant emphysema.  CT angio chest suggestive of infection superimposed on background of advanced emphysema, though pulmonary edema could be considered - BNP elevated to 713.3 - Patient started on IV lasix 40 mg BID-- follow I/Os, renal function, daily weights, and electrolytes with diuresis  - Carvedilol and losartan held for now due to soft BP, resume as BP tolerates. Further initiate GDMT as patient stabilizes   Elevated Troponin  CAD - Patient had an NSTEMI in 07/2016-- LHC at that time showed significant 2-vessel CAD including heavily calcified chronic total occlusion of the proximal/mid LAD, calcified 80% stenosis of the  proximal Lcx/OM1. Managed medically - Now, patient presenting complaining of SOB, nasal congestion, cough, fever. Denies chest pain - hsTn 184>>1180>>938 - Start IV heparin - Patient would be unable to tolerate cath at this time due to her respiratory distress requiring Bipap. Plan for cath once breathing improved  - Echo pending - Continue plavix, statin, ranexa. Carvedilol held due to soft BP   S/P ICD  - [employer] ICD implanted 08/28/19. Followed by EP as an outpatient  - Last device check from 02/22/22 showed normal device function   Otherwise Per primary  - Sepsis due to community acquired PNA - Severe emphysema  - Thrombocytosis  - Hypothyroidism       For questions or updates, please contact [place] Please consult [email or web address] for contact info under        Signed, Margie Billet, PA-C  04/27/2022, 7:01 AM

## 2022-04-27 NOTE — Assessment & Plan Note (Addendum)
Last echocardiogram on 06/2019 with EF of 25 to 30% with mid and apical segments severely hypokinetic. -Patient on home regimen of 20 mg Lasix 3 times a week -Continue with IV '20mg'$  daily with strict intake and output -repeat echocardiogram -Hold home Coreg and losartan per cardiology

## 2022-04-27 NOTE — Progress Notes (Signed)
PROGRESS NOTE    Pam [name]  SPQ:[id number] DOB: 1951-06-21 DOA: 04/26/2022 PCP: Loraine Leriche., MD  70/F with history of CAD, PCI and stents, ischemic cardiomyopathy, EF 25-30%, COPD, hypothyroidism presented to the ED with cough, shortness of breath. -In the ER temp was 100.5, tachypneic placed on BiPAP -Chest x-ray noted diffuse reticulonodular infiltrates suspicious for atypical pneumonia versus interstitial edema -CT chest noted advanced emphysema, interlobar septal thickening, right middle lobe infiltrate and trace pleural effusion -Troponin was 184, trended up to 1180   Subjective: -Short of breath, coughing  Assessment and Plan:  Acute respiratory failure with hypoxia (HCC) -multifactorial, secondary to pneumonia/COPD and CHF -Currently on BiPAP, wean off today as tolerated -Continue IV Lasix and Rocephin/azithromycin -add IV steroids, DuoNebs -Wishes to remain full code  NSTEMI - known 2-vessel CAD including heavily calcified chronic total occlusion of the proximal/mid LAD, calcified 80% stenosis of the proximal Lcx/OM1, medical management recommended then -Troponin has trended up to 1180, continue IV heparin -Cards following, plan for left heart cath this admission -Continue Ranexa, Plavix, statin -Follow-up echo  Acute on chronic systolic CHF -Nonischemic cardiomyopathy, EF 25 to 30% and prior PCI, stents -Follow-up repeat echo -Continue IV Lasix today will increase dose -Coreg and losartan on hold  Community-acquired pneumonia Sepsis, POA COPD exacerbation -Ceftriaxone, azithromycin -IV steroids, DuoNebs -Follow-up blood cultures  Hyponatremia -Likely secondary to CHF and possibly SIADH, monitor  Hypothyroidism Continue home levothyroxine   DVT prophylaxis: IV heparin Code Status: Full code Family Communication: Discussed with patient detail, no family at bedside Disposition Plan: Home likely 3 to 4 days  Consultants:  Cardiology   Procedures:   Antimicrobials:    Objective: Vitals:   04/27/22 0930 04/27/22 1000 04/27/22 1030 04/27/22 1130  BP: (!) 118/56 119/63 121/67 117/72  Pulse: 84 82 87 81  Resp: (!) 22 (!) 32 (!) 29 (!) 25  Temp:      TempSrc:      SpO2: 92% 90% 92% 96%  Weight:      Height:       No intake or output data in the 24 hours ending 04/27/22 1239 Filed Weights   04/26/22 1501 04/27/22 0800  Weight: 54.4 kg 54.4 kg    Examination:  General exam: Chronically ill thinly built female sitting up in bed, BiPAP on, AAO x3, mildly tachypneic HEENT: Positive JVD CVS: S1-S2, regular rhythm Lungs: Scattered wheezes and rhonchi Abdomen: Soft, nontender, bowel sounds present Extremities: Trace edema Skin: No rashes Psychiatry: Anxious    Data Reviewed:   CBC: Recent Labs  Lab 04/26/22 1555 04/27/22 0252 04/27/22 0303  WBC 13.2* 11.0*  --   NEUTROABS 9.1*  --   --   HGB 12.6 11.6* 11.6*  HCT 35.1* 34.3* 34.0*  MCV 98.0 104.9*  --   PLT 469* 296  --    Basic Metabolic Panel: Recent Labs  Lab 04/26/22 1555 04/27/22 0252 04/27/22 0303  NA 126* 131* 129*  K 4.7 4.4 3.8  CL 90* 96*  --   CO2 26 18*  --   GLUCOSE 138* 93  --   BUN 13 14  --   CREATININE 0.96 0.91  --   CALCIUM 9.0 8.9  --   MG 1.8  --   --    GFR: Estimated Creatinine Clearance: 47.6 mL/min (by C-G formula based on SCr of 0.91 mg/dL). Liver Function Tests: Recent Labs  Lab 04/26/22 1555  AST 36  ALT 44  ALKPHOS 129*  BILITOT 0.6  PROT 6.9  ALBUMIN 3.3*   Recent Labs  Lab 04/26/22 1555  LIPASE 33   No results for input(s): "AMMONIA" in the last 168 hours. Coagulation Profile: No results for input(s): "INR", "PROTIME" in the last 168 hours. Cardiac Enzymes: No results for input(s): "CKTOTAL", "CKMB", "CKMBINDEX", "TROPONINI" in the last 168 hours. BNP (last 3 results) No results for input(s): "PROBNP" in the last 8760 hours. HbA1C: No results for input(s): "HGBA1C" in the  last 72 hours. CBG: No results for input(s): "GLUCAP" in the last 168 hours. Lipid Profile: No results for input(s): "CHOL", "HDL", "LDLCALC", "TRIG", "CHOLHDL", "LDLDIRECT" in the last 72 hours. Thyroid Function Tests: No results for input(s): "TSH", "T4TOTAL", "FREET4", "T3FREE", "THYROIDAB" in the last 72 hours. Anemia Panel: No results for input(s): "VITAMINB12", "FOLATE", "FERRITIN", "TIBC", "IRON", "RETICCTPCT" in the last 72 hours. Urine analysis: No results found for: "COLORURINE", "APPEARANCEUR", "LABSPEC", "PHURINE", "GLUCOSEU", "HGBUR", "BILIRUBINUR", "KETONESUR", "PROTEINUR", "UROBILINOGEN", "NITRITE", "LEUKOCYTESUR" Sepsis Labs: '@LABRCNTIP'$ (procalcitonin:4,lacticidven:4)  ) Recent Results (from the past 240 hour(s))  SARS Coronavirus 2 by RT PCR (hospital order, performed in [facility] hospital lab) *cepheid single result test* Anterior Nasal Swab     Status: None   Collection Time: 04/26/22  3:18 PM   Specimen: Anterior Nasal Swab  Result Value Ref Range Status   SARS Coronavirus 2 by RT PCR NEGATIVE NEGATIVE Final    Comment: (NOTE) SARS-CoV-2 target nucleic acids are NOT DETECTED.  The SARS-CoV-2 RNA is generally detectable in upper and lower respiratory specimens during the acute phase of infection. The lowest concentration of SARS-CoV-2 viral copies this assay can detect is 250 copies / mL. A negative result does not preclude SARS-CoV-2 infection and should not be used as the sole basis for treatment or other patient management decisions.  A negative result may occur with improper specimen collection / handling, submission of specimen other than nasopharyngeal swab, presence of viral mutation(s) within the areas targeted by this assay, and inadequate number of viral copies (<250 copies / mL). A negative result must be combined with clinical observations, patient history, and epidemiological information.  Fact Sheet for Patients:    https://www.patel.info/  Fact Sheet for Healthcare Providers: https://hall.com/  This test is not yet approved or  cleared by the [place] FDA and has been authorized for detection and/or diagnosis of SARS-CoV-2 by FDA under an Emergency Use Authorization (EUA).  This EUA will remain in effect (meaning this test can be used) for the duration of the COVID-19 declaration under Section 564(b)(1) of the Act, 21 U.S.C. section 360bbb-3(b)(1), unless the authorization is terminated or revoked sooner.  Performed at [facility], [place] [address] [place], [place] [address]   Blood culture (routine x 2)     Status: None (Preliminary result)   Collection Time: 04/26/22  3:21 PM   Specimen: BLOOD  Result Value Ref Range Status   Specimen Description BLOOD RIGHT ANTECUBITAL  Final   Special Requests   Final    BOTTLES DRAWN AEROBIC AND ANAEROBIC Blood Culture adequate volume   Culture   Final    NO GROWTH < 24 HOURS Performed at [facility], [place] [address] [place], [place] [address]    Report Status PENDING  Incomplete  Blood culture (routine x 2)     Status: None (Preliminary result)   Collection Time: 04/26/22  3:26 PM   Specimen: BLOOD  Result Value Ref Range Status   Specimen Description BLOOD LEFT ANTECUBITAL  Final   Special Requests  Final    BOTTLES DRAWN AEROBIC AND ANAEROBIC Blood Culture results may not be optimal due to an excessive volume of blood received in culture bottles   Culture   Final    NO GROWTH < 24 HOURS Performed at [facility] [address], [place], [place] [address]    Report Status PENDING  Incomplete     Radiology Studies: CT Angio Chest PE W and/or Wo Contrast  Result Date: 04/26/2022 CLINICAL DATA:  Shortness of breath; PE suspected EXAM: CT ANGIOGRAPHY CHEST WITH CONTRAST TECHNIQUE: Multidetector CT imaging of the chest was performed using the standard protocol during bolus  administration of intravenous contrast. Multiplanar CT image reconstructions and MIPs were obtained to evaluate the vascular anatomy. RADIATION DOSE REDUCTION: This exam was performed according to the departmental dose-optimization program which includes automated exposure control, adjustment of the mA and/or kV according to patient size and/or use of iterative reconstruction technique. CONTRAST:  79m OMNIPAQUE IOHEXOL 350 MG/ML SOLN COMPARISON:  Radiographs earlier today and CT 12/06/2021 FINDINGS: Cardiovascular: Satisfactory opacification of the pulmonary arteries to the segmental level. No evidence of pulmonary embolism. Aortic and coronary artery atherosclerosis. Coronary stenting. Small pericardial effusion. Left chest wall ICD. Mediastinum/Nodes: No enlarged mediastinal, hilar, or axillary lymph nodes. Thyroid gland, trachea, and esophagus demonstrate no significant findings. Lungs/Pleura: Advanced emphysema. Diffuse interlobular septal thickening greatest in the lower lobes. There is more confluence infiltrates in the right middle lobe, lingula and greatest in the posterior lower lobes. Bronchial wall thickening greatest in the lower lobes. Normal variant azygous fissure. Trace bilateral pleural effusions. Upper Abdomen: No acute abnormality though evaluation is compromised by respiratory motion. Musculoskeletal: No chest wall abnormality. No acute osseous findings. Body wall anasarca. Review of the MIP images confirms the above findings. IMPRESSION: 1. No acute pulmonary embolism. 2. Interlobular septal thickening with more confluent opacities in the lower lungs. This is favored to represent infection superimposed on a background of advanced emphysema though pulmonary edema could be considered. 3. Aortic Atherosclerosis (ICD10-I70.0) and Emphysema (ICD10-J43.9). 4. Coronary artery disease/stenting. 5. Small pericardial effusion. Electronically Signed   By: TPlacido SouM.D.   On: 04/26/2022 21:57   DG  Chest Portable 1 View  Result Date: 04/26/2022 CLINICAL DATA:  Hypoxia. EXAM: PORTABLE CHEST 1 VIEW COMPARISON:  August 28, 2019 FINDINGS: Cardiac pacemaker in stable position. Calcific atherosclerotic disease and tortuosity of the aorta. Cardiomediastinal silhouette is normal. Mediastinal contours appear intact. Diffuse reticulonodular infiltration of the lungs with lower lobe predominance. Background upper lobe predominant emphysema. Osseous structures are without acute abnormality. Soft tissues are grossly normal. IMPRESSION: 1. Diffuse reticulonodular infiltration of the lungs with lower lobe predominance, suspicious for atypical pneumonia or interstitial edema with atypical presentation due to underlying chronic lung disease. 2. Background upper lobe predominant emphysema. Electronically Signed   By: DFidela SalisburyM.D.   On: 04/26/2022 15:47     Scheduled Meds:  atorvastatin  80 mg Oral Daily   clopidogrel  75 mg Oral Daily   furosemide  40 mg Intravenous BID   guaiFENesin  600 mg Oral BID   levothyroxine  37.5 mcg Oral QAC breakfast   methylPREDNISolone (SOLU-MEDROL) injection  60 mg Intravenous Q12H   ranolazine  1,000 mg Oral BID   Continuous Infusions:  azithromycin     cefTRIAXone (ROCEPHIN)  IV     heparin 650 Units/hr (04/27/22 0843)     LOS: 1 day    Time spent: 365m    PrDomenic PoliteMD [facility]   04/27/2022, 12:39 PM

## 2022-04-27 NOTE — ED Notes (Signed)
Pt requested to be placed on BiPap. RT notified

## 2022-04-27 NOTE — Assessment & Plan Note (Signed)
Troponin of 184-->1180 -continue to trend and cardiology advised placing on heparin infusion if there is exponential trend but otherwise suspect demand ischemia. Pt denies any chest pain.

## 2022-04-27 NOTE — Assessment & Plan Note (Signed)
Has baseline chronic hyponatremia around 130s.  Presented with hyponatremia of 126.  Currently being diuresed.  We will follow repeat sodium.

## 2022-04-28 ENCOUNTER — Other Ambulatory Visit (HOSPITAL_COMMUNITY): Payer: Self-pay

## 2022-04-28 DIAGNOSIS — I5023 Acute on chronic systolic (congestive) heart failure: Secondary | ICD-10-CM | POA: Diagnosis not present

## 2022-04-28 DIAGNOSIS — R778 Other specified abnormalities of plasma proteins: Secondary | ICD-10-CM | POA: Diagnosis not present

## 2022-04-28 DIAGNOSIS — Z95810 Presence of automatic (implantable) cardiac defibrillator: Secondary | ICD-10-CM

## 2022-04-28 DIAGNOSIS — J9601 Acute respiratory failure with hypoxia: Secondary | ICD-10-CM | POA: Diagnosis not present

## 2022-04-28 LAB — BASIC METABOLIC PANEL
Anion gap: 11 (ref 5–15)
BUN: 15 mg/dL (ref 8–23)
CO2: 22 mmol/L (ref 22–32)
Calcium: 8.5 mg/dL — ABNORMAL LOW (ref 8.9–10.3)
Chloride: 98 mmol/L (ref 98–111)
Creatinine, Ser: 0.9 mg/dL (ref 0.44–1.00)
GFR, Estimated: >60 mL/min (ref >60)
Glucose, Bld: 114 mg/dL — ABNORMAL HIGH (ref 70–99)
Potassium: 3.6 mmol/L (ref 3.5–5.1)
Sodium: 131 mmol/L — ABNORMAL LOW (ref 135–145)

## 2022-04-28 LAB — HEPARIN LEVEL (UNFRACTIONATED)
Heparin Unfractionated: 0.38 IU/mL (ref 0.30–0.70)
Heparin Unfractionated: 0.25 IU/mL — ABNORMAL LOW (ref 0.30–0.70)
Heparin Unfractionated: 0.34 IU/mL (ref 0.30–0.70)

## 2022-04-28 LAB — CBC
HCT: 28.8 % — ABNORMAL LOW (ref 36.0–46.0)
Hemoglobin: 10.6 g/dL — ABNORMAL LOW (ref 12.0–15.0)
MCH: 35.3 pg — ABNORMAL HIGH (ref 26.0–34.0)
MCHC: 36.8 g/dL — ABNORMAL HIGH (ref 30.0–36.0)
MCV: 96 fL (ref 80.0–100.0)
Platelets: 394 10*3/uL (ref 150–400)
RBC: 3 MIL/uL — ABNORMAL LOW (ref 3.87–5.11)
RDW: 13.9 % (ref 11.5–15.5)
WBC: 14.2 10*3/uL — ABNORMAL HIGH (ref 4.0–10.5)
nRBC: 0 % (ref 0.0–0.2)

## 2022-04-28 MED ORDER — POTASSIUM CHLORIDE CRYS ER 20 MEQ PO TBCR
40.0000 meq | EXTENDED_RELEASE_TABLET | Freq: Once | ORAL | Status: AC
  Administered 2022-04-28: 40 meq via ORAL
  Filled 2022-04-28: qty 2

## 2022-04-28 MED ORDER — ORAL CARE MOUTH RINSE: 15.0000 mL | OROMUCOSAL | Status: DC | PRN

## 2022-04-28 MED ORDER — FUROSEMIDE 10 MG/ML IJ SOLN
20.0000 mg | Freq: Once | INTRAMUSCULAR | Status: AC
  Administered 2022-04-28: 20 mg via INTRAVENOUS
  Filled 2022-04-28: qty 2

## 2022-04-28 MED ORDER — EMPAGLIFLOZIN 10 MG PO TABS
10.0000 mg | ORAL_TABLET | Freq: Every day | ORAL | Status: DC
  Administered 2022-04-28 – 2022-05-02 (×4): 10 mg via ORAL
  Filled 2022-04-28 (×4): qty 1

## 2022-04-28 MED ORDER — VALACYCLOVIR HCL 500 MG PO TABS
1000.0000 mg | ORAL_TABLET | Freq: Every day | ORAL | Status: DC
  Administered 2022-04-28 – 2022-05-02 (×4): 1000 mg via ORAL
  Filled 2022-04-28 (×5): qty 2

## 2022-04-28 MED ORDER — PNEUMOCOCCAL 20-VAL CONJ VACC 0.5 ML IM SUSY
0.5000 mL | PREFILLED_SYRINGE | INTRAMUSCULAR | Status: DC
  Filled 2022-04-28: qty 0.5

## 2022-04-28 NOTE — Progress Notes (Signed)
ANTICOAGULATION CONSULT NOTE- follow-up  Pharmacy Consult for heparin Indication: chest pain/ACS  Allergies  Allergen Reactions   Lisinopril Cough    Dry cough   Spironolactone Other (See Comments)    Low sodium    Patient Measurements: Height: '5\' 3"'$  (160 cm) Weight: 54.4 kg (119 lb 14.9 oz) IBW/kg (Calculated) : 52.4 Heparin Dosing Weight: 54.4kg  Vital Signs: Temp: 98 F (36.7 C) (09/28 1930) Temp Source: Oral (09/28 1930) BP: 129/74 (09/28 1617) Pulse Rate: 72 (09/28 1617)  Labs: Recent Labs    04/26/22 1555 04/26/22 1904 04/27/22 0252 04/27/22 0303 04/27/22 1600 04/27/22 2359 04/28/22 1140 04/28/22 2054  HGB 12.6  --  11.6* 11.6*  --  10.6*  --   --   HCT 35.1*  --  34.3* 34.0*  --  28.8*  --   --   PLT 469*  --  296  --   --  394  --   --   HEPARINUNFRC  --   --   --   --    < > 0.38 0.25* 0.34  CREATININE 0.96  --  0.91  --   --  0.90  --   --   TROPONINIHS 184* 1,180* 938*  --   --   --   --   --    < > = values in this interval not displayed.     Estimated Creatinine Clearance: 48.1 mL/min (by C-G formula based on SCr of 0.9 mg/dL).   Medical History: Past Medical History:  Diagnosis Date   Abnormal EKG    AICD (automatic cardioverter/defibrillator) present    Alcoholic intoxication without complication (HCC)    Allergy    Cardiomyopathy, ischemic    Cataract    Chest pain 08/29/2016   CHF (congestive heart failure) (HCC)    Chronic systolic heart failure ([place]) 08/29/2016   Facial laceration    Fall 07/07/2016   Hyperlipidemia    Hypertension 03/03/2017   Hyponatremia    Hypothyroidism 07/08/2016   NSTEMI (non-ST elevated myocardial infarction) ([place])    Syncope    Thyroid disease     Medications:  Scheduled:   atorvastatin  80 mg Oral Daily   clopidogrel  75 mg Oral Daily   empagliflozin  10 mg Oral Daily   guaiFENesin  600 mg Oral BID   levothyroxine  37.5 mcg Oral QAC breakfast   methylPREDNISolone (SOLU-MEDROL) injection  60  mg Intravenous Q12H   [START ON 04/29/2022] pneumococcal 20-valent conjugate vaccine  0.5 mL Intramuscular Tomorrow-1000   ranolazine  1,000 mg Oral BID   valACYclovir  1,000 mg Oral Daily   Assessment: 20 yoF with PMH of CAD (NSTEMI 2017), ICM, HFrEF s/p [place]-ICD, HTN, HLD, hypothyoridism who presented with shortness of breath. Pharmacy consulted to dose heparin for ACS/NSTEMI. No PTA anticoagulation  Heparin level came back subtherapeutic at 0.25, on 800 units/hr. No s/sx of bleeding or infusion issues.   9/28 PM HL 0.34- therapeutic No signs of bleeding  No issues with the infusion  Goal of Therapy:  Heparin level 0.3-0.7 units/ml Monitor platelets by anticoagulation protocol: Yes   Plan:  Continue heparin infusion at 900 units/hr Order heparin level iw/ AM labs Monitor daily HL, CBC, and for s/sx of bleeding   Vaughan Basta BS, PharmD, BCPS Clinical Pharmacist 04/28/2022 9:33 PM  Contact: [phone] after 3 PM  "Be curious, not judgmental..." -Jamal Maes

## 2022-04-28 NOTE — Progress Notes (Signed)
Heart Failure Stewardship Pharmacist Progress Note   PCP: Loraine Leriche., MD PCP-Cardiologist: Lauree Chandler, MD    HPI:  71 yo F with PMH of CAD, CHF, ICM, HTN, HLD, hypothyroidism, and osteoporosis.  She was admitted to [facility] in December 2017 with a NSTEMI. She was found to have a chronic occlusion of the mid LAD and severe, calcified stenosis in the proximal Circumflex.  LVEF was 30% by LV gram and 35-40% by echo. Cardiac MRI December 2017 with non-viability of the anterior wall in the LAD territory. LVEF 38% by cardiac MRI. The Circumflex stenosis was treated with orbital atherectomy and stenting using a drug eluting stent in a staged procedure in January 2018. Echo November 2020 with LVEF 25-30%. Dilated LV. Mild MR. ICD placed in January 2021 and followed by Dr. Lennie Odor  She presented to the ED on 9/26 with shortness of breath, hypoxia, and a productive cough. CXR with PNA vs interstitial edema. CTA negative for PE and favoring infection. Started antibiotics for PNA. An ECHO was done on 9/27 and LVEF was 25-30% with G2DD, RV normal, and trivial MR.  Current HF Medications: Diuretic: furosemide 20 mg IV x 1 today SGLT2i: Jardiance 10 mg daily  Prior to admission HF Medications: Diuretic: furosemide 20 mg MWF Beta blocker: carvedilol 6.25 mg BID ACE/ARB/ARNI: losartan 100 mg daily  Pertinent Lab Values: Serum creatinine 0.90, BUN 15, Potassium 3.6, Sodium 131, BNP 713.3, Magnesium 1.8   Vital Signs: Weight: 119 lbs (admission weight: 119 lbs) Blood pressure: 100/50s  Heart rate: 70s  I/O: -451m so far today  Medication Assistance / Insurance Benefits Check: Does the patient have prescription insurance?  Yes [place]  [facility] to admission [facility]? Yes Is the patient willing to transition their outpatient pharmacy [facility]?   Pending    Assessment: 1. Acute on chronic systolic CHF (LVEF 257-32%, due to ICM. NYHA class II symptoms. - Furosemide 20 mg IV x 1 today. Strict I/Os and daily weights. Goal K>4. KCl 40 mEq x 1 given today. [facility] - Holding PTA carvedilol and losartan with wheezing and low BP - Consider adding spironolactone 12.5 mg daily prior to discharge if BP improves - Continue Jardiance 10 mg daily   Plan: 1) Medication changes recommended at this time: - Continue current regimen  2) Patient assistance: - Has FTeacher, musicplan - should be able to use monthly copay cards  3)  Education  - To be completed prior to discharge  MKerby Nora PharmD, BCPS Heart Failure SCytogeneticistPhone [phone]

## 2022-04-28 NOTE — Progress Notes (Signed)
ANTICOAGULATION CONSULT NOTE  Pharmacy Consult for heparin Indication: chest pain/ACS  Allergies  Allergen Reactions   Lisinopril Cough    Dry cough   Spironolactone Other (See Comments)    Low sodium    Patient Measurements: Height: '5\' 3"'$  (160 cm) Weight: 54.4 kg (119 lb 14.9 oz) IBW/kg (Calculated) : 52.4 Heparin Dosing Weight: 54.4kg  Vital Signs: Temp: 97.9 F (36.6 C) (09/28 1234) Temp Source: Oral (09/28 1234) BP: 112/55 (09/28 1234) Pulse Rate: 72 (09/28 1234)  Labs: Recent Labs    04/26/22 1555 04/26/22 1904 04/27/22 0252 04/27/22 0303 04/27/22 1600 04/27/22 2359 04/28/22 1140  HGB 12.6  --  11.6* 11.6*  --  10.6*  --   HCT 35.1*  --  34.3* 34.0*  --  28.8*  --   PLT 469*  --  296  --   --  394  --   HEPARINUNFRC  --   --   --   --  0.10* 0.38 0.25*  CREATININE 0.96  --  0.91  --   --  0.90  --   TROPONINIHS 184* 1,180* 938*  --   --   --   --      Estimated Creatinine Clearance: 48.1 mL/min (by C-G formula based on SCr of 0.9 mg/dL).   Medical History: Past Medical History:  Diagnosis Date   Abnormal EKG    AICD (automatic cardioverter/defibrillator) present    Alcoholic intoxication without complication (HCC)    Allergy    Cardiomyopathy, ischemic    Cataract    Chest pain 08/29/2016   CHF (congestive heart failure) (HCC)    Chronic systolic heart failure ([place]) 08/29/2016   Facial laceration    Fall 07/07/2016   Hyperlipidemia    Hypertension 03/03/2017   Hyponatremia    Hypothyroidism 07/08/2016   NSTEMI (non-ST elevated myocardial infarction) ([place])    Syncope    Thyroid disease     Medications:  Scheduled:   atorvastatin  80 mg Oral Daily   clopidogrel  75 mg Oral Daily   empagliflozin  10 mg Oral Daily   guaiFENesin  600 mg Oral BID   levothyroxine  37.5 mcg Oral QAC breakfast   methylPREDNISolone (SOLU-MEDROL) injection  60 mg Intravenous Q12H   [START ON 04/29/2022] pneumococcal 20-valent conjugate vaccine  0.5 mL  Intramuscular Tomorrow-1000   ranolazine  1,000 mg Oral BID   Assessment: 81 yoF with PMH of CAD (NSTEMI 2017), ICM, HFrEF s/p [place]-ICD, HTN, HLD, hypothyoridism who presented with shortness of breath. Pharmacy consulted to dose heparin for ACS/NSTEMI. No PTA anticoagulation  Heparin level came back subtherapeutic at 0.25, on 800 units/hr. No s/sx of bleeding or infusion issues.   Goal of Therapy:  Heparin level 0.3-0.7 units/ml Monitor platelets by anticoagulation protocol: Yes   Plan:  Increase heparin infusion to 900 units/hr Order heparin level in 6 hours Monitor daily HL, CBC, and for s/sx of bleeding   Antonietta Jewel, PharmD, [place] Pharmacist  Phone: [phone] 04/28/2022 1:21 PM  Please check AMION for all [place] phone numbers After 10:00 PM, call [place] [phone]

## 2022-04-28 NOTE — Progress Notes (Signed)
Primary Cardiologist:  [place]  Subjective:  Still dyspnic less tachypnea   Objective:  Vitals:   04/27/22 1900 04/27/22 2300 04/27/22 2350 04/28/22 0438  BP: 94/66  (!) 107/55 (!) 110/57  Pulse: 74  70 77  Resp: '20  18 19  '$ Temp: 98.1 F (36.7 C)  98.2 F (36.8 C) 98 F (36.7 C)  TempSrc: Oral Oral Oral Oral  SpO2: 99%  99% 97%  Weight:      Height:        Intake/Output from previous day:  Intake/Output Summary (Last 24 hours) at 04/28/2022 0836 Last data filed at 04/28/2022 0540 Gross per 24 hour  Intake 358.23 ml  Output 215 ml  Net 143.23 ml    Physical Exam: Chronically ill thin white female Inspiratory wheezing and rhonchi No murmur AICD under left clavicle  Abdomen benign No edema  Lab Results: Basic Metabolic Panel: Recent Labs    04/26/22 1555 04/27/22 0252 04/27/22 0303 04/27/22 2359  NA 126* 131* 129* 131*  K 4.7 4.4 3.8 3.6  CL 90* 96*  --  98  CO2 26 18*  --  22  GLUCOSE 138* 93  --  114*  BUN 13 14  --  15  CREATININE 0.96 0.91  --  0.90  CALCIUM 9.0 8.9  --  8.5*  MG 1.8  --   --   --    Liver Function Tests: Recent Labs    04/26/22 1555  AST 36  ALT 44  ALKPHOS 129*  BILITOT 0.6  PROT 6.9  ALBUMIN 3.3*   Recent Labs    04/26/22 1555  LIPASE 33   CBC: Recent Labs    04/26/22 1555 04/27/22 0252 04/27/22 0303 04/27/22 2359  WBC 13.2* 11.0*  --  14.2*  NEUTROABS 9.1*  --   --   --   HGB 12.6 11.6* 11.6* 10.6*  HCT 35.1* 34.3* 34.0* 28.8*  MCV 98.0 104.9*  --  96.0  PLT 469* 296  --  394     Imaging: ECHOCARDIOGRAM COMPLETE  Result Date: 04/27/2022    ECHOCARDIOGRAM REPORT   Patient Name:   Pam [name] Date of Exam: 04/27/2022 Medical Rec #:  [id number]   Height:       63.0 in Accession #:    [id number]  Weight:       119.9 lb Date of Birth:  10-Dec-1950  BSA:          1.556 m Patient Age:    71 years    BP:           118/56 mmHg Patient Gender: F           HR:           79 bpm. Exam Location:  Inpatient  Procedure: 2D Echo, Cardiac Doppler, Color Doppler and Intracardiac            Opacification Agent Indications:    CHF  History:        Patient has prior history of Echocardiogram examinations, most                 recent 06/19/2019. CHF and Cardiomyopathy, CAD, Abnormal ECG,                 Signs/Symptoms:Chest Pain; Risk Factors:Hypertension and                 Dyslipidemia.  Sonographer:    Greer Pickerel Referring Phys: [id number] [place] T TU  Sonographer Comments:  Technically challenging study due to limited acoustic windows, suboptimal parasternal window and no subcostal window. Image acquisition challenging due to patient body habitus and Image acquisition challenging due to respiratory motion. IMPRESSIONS  1. Left ventricular ejection fraction, by estimation, is 25 to 30%. The left ventricle has severely decreased function. The left ventricle demonstrates regional wall motion abnormalities (see scoring diagram/findings for description). Left ventricular diastolic parameters are consistent with Grade II diastolic dysfunction (pseudonormalization). Elevated left ventricular end-diastolic pressure.  2. Right ventricular systolic function is normal. The right ventricular size is normal. There is mildly elevated pulmonary artery systolic pressure.  3. Left atrial size was moderately dilated.  4. The mitral valve is normal in structure. Trivial mitral valve regurgitation. No evidence of mitral stenosis.  5. The aortic valve is normal in structure. Aortic valve regurgitation is not visualized. No aortic stenosis is present.  6. The inferior vena cava is normal in size with greater than 50% respiratory variability, suggesting right atrial pressure of 3 mmHg. FINDINGS  Left Ventricle: Left ventricular ejection fraction, by estimation, is 25 to 30%. The left ventricle has severely decreased function. The left ventricle demonstrates regional wall motion abnormalities. Definity contrast agent was given IV to delineate the  left ventricular endocardial borders. The left ventricular internal cavity size was normal in size. There is no left ventricular hypertrophy. Left ventricular diastolic parameters are consistent with Grade II diastolic dysfunction (pseudonormalization). Elevated left ventricular end-diastolic pressure.  LV Wall Scoring: The apical inferior segment and apex are akinetic. The mid and distal anterior wall, mid and distal lateral wall, mid and distal anterior septum, inferior wall, and mid inferoseptal segment are hypokinetic. The antero-lateral wall, basal anteroseptal segment, basal inferolateral segment, basal anterior segment, and basal inferoseptal segment are normal. Right Ventricle: The right ventricular size is normal. No increase in right ventricular wall thickness. Right ventricular systolic function is normal. There is mildly elevated pulmonary artery systolic pressure. The tricuspid regurgitant velocity is 2.88  m/s, and with an assumed right atrial pressure of 8 mmHg, the estimated right ventricular systolic pressure is 66.5 mmHg. Left Atrium: Left atrial size was moderately dilated. Right Atrium: Right atrial size was normal in size. Pericardium: There is no evidence of pericardial effusion. Mitral Valve: The mitral valve is normal in structure. Trivial mitral valve regurgitation. No evidence of mitral valve stenosis. Tricuspid Valve: The tricuspid valve is normal in structure. Tricuspid valve regurgitation is mild . No evidence of tricuspid stenosis. Aortic Valve: The aortic valve is normal in structure. Aortic valve regurgitation is not visualized. No aortic stenosis is present. Pulmonic Valve: The pulmonic valve was normal in structure. Pulmonic valve regurgitation is not visualized. No evidence of pulmonic stenosis. Aorta: The aortic root is normal in size and structure. Venous: The inferior vena cava is normal in size with greater than 50% respiratory variability, suggesting right atrial pressure of 3  mmHg. IAS/Shunts: No atrial level shunt detected by color flow Doppler. Additional Comments: A device lead is visualized.  LEFT VENTRICLE PLAX 2D LVOT diam:     0.85 cm     Diastology LV SV:         10          LV e' medial:    5.10 cm/s LV SV Index:   7           LV E/e' medial:  17.3 LVOT Area:     0.57 cm    LV e' lateral:   4.95 cm/s  LV E/e' lateral: 17.8  LV Volumes (MOD) LV vol d, MOD A2C: 79.9 ml LV vol d, MOD A4C: 77.5 ml LV vol s, MOD A2C: 49.6 ml LV vol s, MOD A4C: 60.7 ml LV SV MOD A2C:     30.3 ml LV SV MOD A4C:     77.5 ml LV SV MOD BP:      24.0 ml RIGHT VENTRICLE RV S prime:     14.20 cm/s TAPSE (M-mode): 1.5 cm LEFT ATRIUM           Index        RIGHT ATRIUM           Index LA Vol (A4C): 55.4 ml 35.61 ml/m  RA Area:     12.70 cm                                    RA Volume:   30.10 ml  19.35 ml/m  AORTIC VALVE LVOT Vmax:   108.00 cm/s LVOT Vmean:  66.100 cm/s LVOT VTI:    0.185 m  AORTA Ao Root diam: 2.30 cm MITRAL VALVE               TRICUSPID VALVE MV Area (PHT): 3.72 cm    TR Peak grad:   33.2 mmHg MV Decel Time: 204 msec    TR Vmax:        288.00 cm/s MV E velocity: 88.30 cm/s MV A velocity: 81.80 cm/s  SHUNTS MV E/A ratio:  1.08        Systemic VTI:  0.18 m                            Systemic Diam: 0.85 cm Skeet Latch MD Electronically signed by Skeet Latch MD Signature Date/Time: 04/27/2022/2:44:38 PM    Final    CT Angio Chest PE W and/or Wo Contrast  Result Date: 04/26/2022 CLINICAL DATA:  Shortness of breath; PE suspected EXAM: CT ANGIOGRAPHY CHEST WITH CONTRAST TECHNIQUE: Multidetector CT imaging of the chest was performed using the standard protocol during bolus administration of intravenous contrast. Multiplanar CT image reconstructions and MIPs were obtained to evaluate the vascular anatomy. RADIATION DOSE REDUCTION: This exam was performed according to the departmental dose-optimization program which includes automated exposure control,  adjustment of the mA and/or kV according to patient size and/or use of iterative reconstruction technique. CONTRAST:  16m OMNIPAQUE IOHEXOL 350 MG/ML SOLN COMPARISON:  Radiographs earlier today and CT 12/06/2021 FINDINGS: Cardiovascular: Satisfactory opacification of the pulmonary arteries to the segmental level. No evidence of pulmonary embolism. Aortic and coronary artery atherosclerosis. Coronary stenting. Small pericardial effusion. Left chest wall ICD. Mediastinum/Nodes: No enlarged mediastinal, hilar, or axillary lymph nodes. Thyroid gland, trachea, and esophagus demonstrate no significant findings. Lungs/Pleura: Advanced emphysema. Diffuse interlobular septal thickening greatest in the lower lobes. There is more confluence infiltrates in the right middle lobe, lingula and greatest in the posterior lower lobes. Bronchial wall thickening greatest in the lower lobes. Normal variant azygous fissure. Trace bilateral pleural effusions. Upper Abdomen: No acute abnormality though evaluation is compromised by respiratory motion. Musculoskeletal: No chest wall abnormality. No acute osseous findings. Body wall anasarca. Review of the MIP images confirms the above findings. IMPRESSION: 1. No acute pulmonary embolism. 2. Interlobular septal thickening with more confluent opacities in the lower lungs. This is favored to represent infection superimposed on a background  of advanced emphysema though pulmonary edema could be considered. 3. Aortic Atherosclerosis (ICD10-I70.0) and Emphysema (ICD10-J43.9). 4. Coronary artery disease/stenting. 5. Small pericardial effusion. Electronically Signed   By: Placido Sou M.D.   On: 04/26/2022 21:57   DG Chest Portable 1 View  Result Date: 04/26/2022 CLINICAL DATA:  Hypoxia. EXAM: PORTABLE CHEST 1 VIEW COMPARISON:  August 28, 2019 FINDINGS: Cardiac pacemaker in stable position. Calcific atherosclerotic disease and tortuosity of the aorta. Cardiomediastinal silhouette is normal.  Mediastinal contours appear intact. Diffuse reticulonodular infiltration of the lungs with lower lobe predominance. Background upper lobe predominant emphysema. Osseous structures are without acute abnormality. Soft tissues are grossly normal. IMPRESSION: 1. Diffuse reticulonodular infiltration of the lungs with lower lobe predominance, suspicious for atypical pneumonia or interstitial edema with atypical presentation due to underlying chronic lung disease. 2. Background upper lobe predominant emphysema. Electronically Signed   By: Fidela Salisbury M.D.   On: 04/26/2022 15:47    Cardiac Studies:  ECG: SR non specific ST changes    Telemetry:  NSR   Echo: EF 25-30% normal RV no significant valve dx  Medications:    atorvastatin  80 mg Oral Daily   clopidogrel  75 mg Oral Daily   guaiFENesin  600 mg Oral BID   levothyroxine  37.5 mcg Oral QAC breakfast   methylPREDNISolone (SOLU-MEDROL) injection  60 mg Intravenous Q12H   [START ON 04/29/2022] pneumococcal 20-valent conjugate vaccine  0.5 mL Intramuscular Tomorrow-1000   ranolazine  1,000 mg Oral BID      azithromycin 500 mg (04/27/22 2128)   cefTRIAXone (ROCEPHIN)  IV 1 g (04/27/22 2013)   heparin 800 Units/hr (04/28/22 0400)    Assessment/Plan:  Pam [name] is a 71 y.o. female with a hx of CAD, ICM, HFrEFs/p [place]-ICD, HTN, HLD, and hypothyroidism who is being seen 04/26/2022 for the evaluation of heart failure and troponin elevation at the request of the ED.  Ischemic DCM:  known CTO LAD by cath 2017 no viability anterior wall 1/18 had orbital atherectomy  And stenting of LCX TTE shows same EF as TTE 2020 25-30% GDMT currently limited by low BP Admission likely combination of baseline severe COPD/pneumonia and CHF BNP 713 Troponin peak 1180 ? Demand ischemia with insufficient collaterals. Needs cath mostly to assess patency of LCX May not be ready to have it tomorrow as she cannot lay flat still Continue heparin No chest pain or acute ECG  changes Holding losartan and coreg with wheezing and low BP will give small dose of lasix today  Pneumonia:  continue Azithromycin and rocephin repeat CXR to see if reticulonodular pattern improves with Rx CHF On steroids for COPD  Would have pulmonary see in house given severity of her COPD and possible need for home oxygen and component of ILD AICD:  no d/c has had normal function  Thyroid:  continue synthroid replacement    Jenkins Rouge 04/28/2022, 8:36 AM

## 2022-04-28 NOTE — Plan of Care (Signed)

## 2022-04-28 NOTE — Progress Notes (Signed)
PROGRESS NOTE    Pam [name]  KDX:[id number] DOB: [birth year] DOA: 04/26/2022 PCP: Loraine Leriche., MD  70/F with history of CAD, PCI and stents, ischemic cardiomyopathy, EF 25-30%, COPD, hypothyroidism presented to the ED with cough, shortness of breath. -In the ER temp was 100.5, tachypneic placed on BiPAP -Chest x-ray noted diffuse reticulonodular infiltrates suspicious for atypical pneumonia versus interstitial edema -CT chest noted advanced emphysema, interlobar septal thickening, right middle lobe infiltrate and trace pleural effusion -Troponin was 184, trended up to 1180   Subjective: -Feels better, breathing is improving  Assessment and Plan:  Acute respiratory failure with hypoxia (HCC) -multifactorial, secondary to pneumonia/COPD and CHF -Wean off BiPAP, wean O2 as tolerated -Continue IV steroids, DuoNebs, Rocephin and azithromycin day 2 -Continue IV Lasix today  NSTEMI - known 2-vessel CAD including heavily calcified chronic total occlusion of the proximal/mid LAD, calcified 80% stenosis of the proximal Lcx/OM1, medical management recommended then -Troponin has trended up to 1180, continue IV heparin -Cards following, plan for left heart cath this admission -Continue Ranexa, Plavix, statin -Echo with EF 25% with regional wall motion abnormalities  Acute on chronic systolic CHF -Nonischemic cardiomyopathy, EF 25 to 30% and prior PCI, stents -Repeat echo as noted above, EF 25% with regional wall motion abnormality -Continue IV Lasix today -Coreg and losartan on hold -Add SGLT2i  Community-acquired pneumonia Sepsis, POA COPD exacerbation -Ceftriaxone, azithromycin -IV steroids, DuoNebs -Blood cultures are negative  Hyponatremia -Likely secondary to CHF and possibly SIADH, monitor  Hypothyroidism Continue home levothyroxine   DVT prophylaxis: IV heparin Code Status: Full code Family Communication: Discussed with patient detail, no family at  bedside Disposition Plan: Home likely 3 to 4 days  Consultants: Cardiology   Procedures:   Antimicrobials:    Objective: Vitals:   04/27/22 2350 04/28/22 0438 04/28/22 0800 04/28/22 0900  BP: (!) 107/55 (!) 110/57 (!) 100/47 103/64  Pulse: 70 77 68 75  Resp: '18 19 20 15  '$ Temp: 98.2 F (36.8 C) 98 F (36.7 C) 97.7 F (36.5 C)   TempSrc: Oral Oral Oral   SpO2: 99% 97% 95% 94%  Weight:      Height:        Intake/Output Summary (Last 24 hours) at 04/28/2022 1007 Last data filed at 04/28/2022 0900 Gross per 24 hour  Intake 598.23 ml  Output 415 ml  Net 183.23 ml   Filed Weights   04/26/22 1501 04/27/22 0800  Weight: 54.4 kg 54.4 kg    Examination:  General exam: Chronically ill female sitting up in bed, AAOx3, less tachypneic, off BiPAP HEENT: Positive JVD CVS: S1-S2, regular rhythm Lungs: Improving air movement, scattered rhonchi, rare wheezes Abdomen: Soft, nontender, bowel sounds present Extremities: Trace edema  Skin: No rashes Psychiatry: Anxious    Data Reviewed:   CBC: Recent Labs  Lab 04/26/22 1555 04/27/22 0252 04/27/22 0303 04/27/22 2359  WBC 13.2* 11.0*  --  14.2*  NEUTROABS 9.1*  --   --   --   HGB 12.6 11.6* 11.6* 10.6*  HCT 35.1* 34.3* 34.0* 28.8*  MCV 98.0 104.9*  --  96.0  PLT 469* 296  --  976   Basic Metabolic Panel: Recent Labs  Lab 04/26/22 1555 04/27/22 0252 04/27/22 0303 04/27/22 2359  NA 126* 131* 129* 131*  K 4.7 4.4 3.8 3.6  CL 90* 96*  --  98  CO2 26 18*  --  22  GLUCOSE 138* 93  --  114*  BUN 13 14  --  15  CREATININE 0.96 0.91  --  0.90  CALCIUM 9.0 8.9  --  8.5*  MG 1.8  --   --   --    GFR: Estimated Creatinine Clearance: 48.1 mL/min (by C-G formula based on SCr of 0.9 mg/dL). Liver Function Tests: Recent Labs  Lab 04/26/22 1555  AST 36  ALT 44  ALKPHOS 129*  BILITOT 0.6  PROT 6.9  ALBUMIN 3.3*   Recent Labs  Lab 04/26/22 1555  LIPASE 33   No results for input(s): "AMMONIA" in the last 168  hours. Coagulation Profile: No results for input(s): "INR", "PROTIME" in the last 168 hours. Cardiac Enzymes: No results for input(s): "CKTOTAL", "CKMB", "CKMBINDEX", "TROPONINI" in the last 168 hours. BNP (last 3 results) No results for input(s): "PROBNP" in the last 8760 hours. HbA1C: No results for input(s): "HGBA1C" in the last 72 hours. CBG: No results for input(s): "GLUCAP" in the last 168 hours. Lipid Profile: No results for input(s): "CHOL", "HDL", "LDLCALC", "TRIG", "CHOLHDL", "LDLDIRECT" in the last 72 hours. Thyroid Function Tests: No results for input(s): "TSH", "T4TOTAL", "FREET4", "T3FREE", "THYROIDAB" in the last 72 hours. Anemia Panel: No results for input(s): "VITAMINB12", "FOLATE", "FERRITIN", "TIBC", "IRON", "RETICCTPCT" in the last 72 hours. Urine analysis: No results found for: "COLORURINE", "APPEARANCEUR", "LABSPEC", "PHURINE", "GLUCOSEU", "HGBUR", "BILIRUBINUR", "KETONESUR", "PROTEINUR", "UROBILINOGEN", "NITRITE", "LEUKOCYTESUR" Sepsis Labs: '@LABRCNTIP'$ (procalcitonin:4,lacticidven:4)  ) Recent Results (from the past 240 hour(s))  SARS Coronavirus 2 by RT PCR (hospital order, performed in [facility] hospital lab) *cepheid single result test* Anterior Nasal Swab     Status: None   Collection Time: 04/26/22  3:18 PM   Specimen: Anterior Nasal Swab  Result Value Ref Range Status   SARS Coronavirus 2 by RT PCR NEGATIVE NEGATIVE Final    Comment: (NOTE) SARS-CoV-2 target nucleic acids are NOT DETECTED.  The SARS-CoV-2 RNA is generally detectable in upper and lower respiratory specimens during the acute phase of infection. The lowest concentration of SARS-CoV-2 viral copies this assay can detect is 250 copies / mL. A negative result does not preclude SARS-CoV-2 infection and should not be used as the sole basis for treatment or other patient management decisions.  A negative result may occur with improper specimen collection / handling, submission of specimen  other than nasopharyngeal swab, presence of viral mutation(s) within the areas targeted by this assay, and inadequate number of viral copies (<250 copies / mL). A negative result must be combined with clinical observations, patient history, and epidemiological information.  Fact Sheet for Patients:   https://www.patel.info/  Fact Sheet for Healthcare Providers: https://hall.com/  This test is not yet approved or  cleared by the [place] FDA and has been authorized for detection and/or diagnosis of SARS-CoV-2 by FDA under an Emergency Use Authorization (EUA).  This EUA will remain in effect (meaning this test can be used) for the duration of the COVID-19 declaration under Section 564(b)(1) of the Act, 21 U.S.C. section 360bbb-3(b)(1), unless the authorization is terminated or revoked sooner.  Performed at [facility], [place] [address], [place],  [address]   Blood culture (routine x 2)     Status: None (Preliminary result)   Collection Time: 04/26/22  3:21 PM   Specimen: BLOOD  Result Value Ref Range Status   Specimen Description BLOOD RIGHT ANTECUBITAL  Final   Special Requests   Final    BOTTLES DRAWN AEROBIC AND ANAEROBIC Blood Culture adequate volume   Culture   Final    NO GROWTH 2  DAYS Performed at [facility], [place] [address] [place], [place] [address]    Report Status PENDING  Incomplete  Blood culture (routine x 2)     Status: None (Preliminary result)   Collection Time: 04/26/22  3:26 PM   Specimen: BLOOD  Result Value Ref Range Status   Specimen Description BLOOD LEFT ANTECUBITAL  Final   Special Requests   Final    BOTTLES DRAWN AEROBIC AND ANAEROBIC Blood Culture results may not be optimal due to an excessive volume of blood received in culture bottles   Culture   Final    NO GROWTH 2 DAYS Performed at [facility], [place] [address] [place], [place] [address]    Report Status PENDING   Incomplete  MRSA Next Gen by PCR, Nasal     Status: None   Collection Time: 04/27/22  7:08 PM   Specimen: Nasal Mucosa; Nasal Swab  Result Value Ref Range Status   MRSA by PCR Next Gen NOT DETECTED NOT DETECTED Final    Comment: (NOTE) The GeneXpert MRSA Assay (FDA approved for NASAL specimens only), is one component of a comprehensive MRSA colonization surveillance program. It is not intended to diagnose MRSA infection nor to guide or monitor treatment for MRSA infections. Test performance is not FDA approved in patients less than 39 years old. Performed at [facility], [place] [address] [place], [place] [address]      Radiology Studies: ECHOCARDIOGRAM COMPLETE  Result Date: 04/27/2022    ECHOCARDIOGRAM REPORT   Patient Name:   AMBERIA BAYLESS Date of Exam: 04/27/2022 Medical Rec #:  [id number]   Height:       63.0 in Accession #:    [id number]  Weight:       119.9 lb Date of Birth:  1950-10-07  BSA:          1.556 m Patient Age:    26 years    BP:           118/56 mmHg Patient Gender: F           HR:           79 bpm. Exam Location:  Inpatient Procedure: 2D Echo, Cardiac Doppler, Color Doppler and Intracardiac            Opacification Agent Indications:    CHF  History:        Patient has prior history of Echocardiogram examinations, most                 recent 06/19/2019. CHF and Cardiomyopathy, CAD, Abnormal ECG,                 Signs/Symptoms:Chest Pain; Risk Factors:Hypertension and                 Dyslipidemia.  Sonographer:    Greer Pickerel Referring Phys: [id number] Vernal T TU  Sonographer Comments: Technically challenging study due to limited acoustic windows, suboptimal parasternal window and no subcostal window. Image acquisition challenging due to patient body habitus and Image acquisition challenging due to respiratory motion. IMPRESSIONS  1. Left ventricular ejection fraction, by estimation, is 25 to 30%. The left ventricle has severely decreased function. The left ventricle  demonstrates regional wall motion abnormalities (see scoring diagram/findings for description). Left ventricular diastolic parameters are consistent with Grade II diastolic dysfunction (pseudonormalization). Elevated left ventricular end-diastolic pressure.  2. Right ventricular systolic function is normal. The right ventricular size is normal. There is mildly elevated  pulmonary artery systolic pressure.  3. Left atrial size was moderately dilated.  4. The mitral valve is normal in structure. Trivial mitral valve regurgitation. No evidence of mitral stenosis.  5. The aortic valve is normal in structure. Aortic valve regurgitation is not visualized. No aortic stenosis is present.  6. The inferior vena cava is normal in size with greater than 50% respiratory variability, suggesting right atrial pressure of 3 mmHg. FINDINGS  Left Ventricle: Left ventricular ejection fraction, by estimation, is 25 to 30%. The left ventricle has severely decreased function. The left ventricle demonstrates regional wall motion abnormalities. Definity contrast agent was given IV to delineate the left ventricular endocardial borders. The left ventricular internal cavity size was normal in size. There is no left ventricular hypertrophy. Left ventricular diastolic parameters are consistent with Grade II diastolic dysfunction (pseudonormalization). Elevated left ventricular end-diastolic pressure.  LV Wall Scoring: The apical inferior segment and apex are akinetic. The mid and distal anterior wall, mid and distal lateral wall, mid and distal anterior septum, inferior wall, and mid inferoseptal segment are hypokinetic. The antero-lateral wall, basal anteroseptal segment, basal inferolateral segment, basal anterior segment, and basal inferoseptal segment are normal. Right Ventricle: The right ventricular size is normal. No increase in right ventricular wall thickness. Right ventricular systolic function is normal. There is mildly elevated  pulmonary artery systolic pressure. The tricuspid regurgitant velocity is 2.88  m/s, and with an assumed right atrial pressure of 8 mmHg, the estimated right ventricular systolic pressure is 30.8 mmHg. Left Atrium: Left atrial size was moderately dilated. Right Atrium: Right atrial size was normal in size. Pericardium: There is no evidence of pericardial effusion. Mitral Valve: The mitral valve is normal in structure. Trivial mitral valve regurgitation. No evidence of mitral valve stenosis. Tricuspid Valve: The tricuspid valve is normal in structure. Tricuspid valve regurgitation is mild . No evidence of tricuspid stenosis. Aortic Valve: The aortic valve is normal in structure. Aortic valve regurgitation is not visualized. No aortic stenosis is present. Pulmonic Valve: The pulmonic valve was normal in structure. Pulmonic valve regurgitation is not visualized. No evidence of pulmonic stenosis. Aorta: The aortic root is normal in size and structure. Venous: The inferior vena cava is normal in size with greater than 50% respiratory variability, suggesting right atrial pressure of 3 mmHg. IAS/Shunts: No atrial level shunt detected by color flow Doppler. Additional Comments: A device lead is visualized.  LEFT VENTRICLE PLAX 2D LVOT diam:     0.85 cm     Diastology LV SV:         10          LV e' medial:    5.10 cm/s LV SV Index:   7           LV E/e' medial:  17.3 LVOT Area:     0.57 cm    LV e' lateral:   4.95 cm/s                            LV E/e' lateral: 17.8  LV Volumes (MOD) LV vol d, MOD A2C: 79.9 ml LV vol d, MOD A4C: 77.5 ml LV vol s, MOD A2C: 49.6 ml LV vol s, MOD A4C: 60.7 ml LV SV MOD A2C:     30.3 ml LV SV MOD A4C:     77.5 ml LV SV MOD BP:      24.0 ml RIGHT VENTRICLE RV S prime:  14.20 cm/s TAPSE (M-mode): 1.5 cm LEFT ATRIUM           Index        RIGHT ATRIUM           Index LA Vol (A4C): 55.4 ml 35.61 ml/m  RA Area:     12.70 cm                                    RA Volume:   30.10 ml  19.35  ml/m  AORTIC VALVE LVOT Vmax:   108.00 cm/s LVOT Vmean:  66.100 cm/s LVOT VTI:    0.185 m  AORTA Ao Root diam: 2.30 cm MITRAL VALVE               TRICUSPID VALVE MV Area (PHT): 3.72 cm    TR Peak grad:   33.2 mmHg MV Decel Time: 204 msec    TR Vmax:        288.00 cm/s MV E velocity: 88.30 cm/s MV A velocity: 81.80 cm/s  SHUNTS MV E/A ratio:  1.08        Systemic VTI:  0.18 m                            Systemic Diam: 0.85 cm Skeet Latch MD Electronically signed by Skeet Latch MD Signature Date/Time: 04/27/2022/2:44:38 PM    Final    CT Angio Chest PE W and/or Wo Contrast  Result Date: 04/26/2022 CLINICAL DATA:  Shortness of breath; PE suspected EXAM: CT ANGIOGRAPHY CHEST WITH CONTRAST TECHNIQUE: Multidetector CT imaging of the chest was performed using the standard protocol during bolus administration of intravenous contrast. Multiplanar CT image reconstructions and MIPs were obtained to evaluate the vascular anatomy. RADIATION DOSE REDUCTION: This exam was performed according to the departmental dose-optimization program which includes automated exposure control, adjustment of the mA and/or kV according to patient size and/or use of iterative reconstruction technique. CONTRAST:  68m OMNIPAQUE IOHEXOL 350 MG/ML SOLN COMPARISON:  Radiographs earlier today and CT 12/06/2021 FINDINGS: Cardiovascular: Satisfactory opacification of the pulmonary arteries to the segmental level. No evidence of pulmonary embolism. Aortic and coronary artery atherosclerosis. Coronary stenting. Small pericardial effusion. Left chest wall ICD. Mediastinum/Nodes: No enlarged mediastinal, hilar, or axillary lymph nodes. Thyroid gland, trachea, and esophagus demonstrate no significant findings. Lungs/Pleura: Advanced emphysema. Diffuse interlobular septal thickening greatest in the lower lobes. There is more confluence infiltrates in the right middle lobe, lingula and greatest in the posterior lower lobes. Bronchial wall  thickening greatest in the lower lobes. Normal variant azygous fissure. Trace bilateral pleural effusions. Upper Abdomen: No acute abnormality though evaluation is compromised by respiratory motion. Musculoskeletal: No chest wall abnormality. No acute osseous findings. Body wall anasarca. Review of the MIP images confirms the above findings. IMPRESSION: 1. No acute pulmonary embolism. 2. Interlobular septal thickening with more confluent opacities in the lower lungs. This is favored to represent infection superimposed on a background of advanced emphysema though pulmonary edema could be considered. 3. Aortic Atherosclerosis (ICD10-I70.0) and Emphysema (ICD10-J43.9). 4. Coronary artery disease/stenting. 5. Small pericardial effusion. Electronically Signed   By: TPlacido SouM.D.   On: 04/26/2022 21:57   DG Chest Portable 1 View  Result Date: 04/26/2022 CLINICAL DATA:  Hypoxia. EXAM: PORTABLE CHEST 1 VIEW COMPARISON:  August 28, 2019 FINDINGS: Cardiac pacemaker in stable position. Calcific atherosclerotic disease and tortuosity of  the aorta. Cardiomediastinal silhouette is normal. Mediastinal contours appear intact. Diffuse reticulonodular infiltration of the lungs with lower lobe predominance. Background upper lobe predominant emphysema. Osseous structures are without acute abnormality. Soft tissues are grossly normal. IMPRESSION: 1. Diffuse reticulonodular infiltration of the lungs with lower lobe predominance, suspicious for atypical pneumonia or interstitial edema with atypical presentation due to underlying chronic lung disease. 2. Background upper lobe predominant emphysema. Electronically Signed   By: Fidela Salisbury M.D.   On: 04/26/2022 15:47     Scheduled Meds:  atorvastatin  80 mg Oral Daily   clopidogrel  75 mg Oral Daily   guaiFENesin  600 mg Oral BID   levothyroxine  37.5 mcg Oral QAC breakfast   methylPREDNISolone (SOLU-MEDROL) injection  60 mg Intravenous Q12H   [START ON  04/29/2022] pneumococcal 20-valent conjugate vaccine  0.5 mL Intramuscular Tomorrow-1000   ranolazine  1,000 mg Oral BID   Continuous Infusions:  azithromycin 500 mg (04/27/22 2128)   cefTRIAXone (ROCEPHIN)  IV 1 g (04/27/22 2013)   heparin 800 Units/hr (04/28/22 0400)     LOS: 2 days    Time spent: 20mn    PDomenic Polite MD [facility]   04/28/2022, 10:07 AM

## 2022-04-28 NOTE — TOC Initial Note (Signed)
Transition of Care [facility]) - Initial/Assessment Note    Patient Details  Name: Pam [name] MRN: [id number] Date of Birth: [birth year]  [facility]) CM/SW Contact:    Verdell Carmine, RN Phone Number: 04/28/2022, 4:40 PM  Clinical Narrative:                  Pam [name] 37, presents to the ED with [facility]. She has a history of COPD, lower EF CAD. Lives at home with spouse.  She has a PCP and is currently mobile.  She is requiring oxygen , and started in the ED with Bipap. She is being diuresed and on antibiotics for pneumonia.  She may need oxygen at home, will obtain  oxygen qualifiers if needed. Care Management will follow for needs, recommendations, and transitions. Expected Discharge Plan: Home/Self Care Barriers to Discharge: Continued Medical Work up   Patient Goals and CMS Choice        Expected Discharge Plan and Services Expected Discharge Plan: Home/Self Care       Living arrangements for the past 2 months: Single Family Home                                      Prior Living Arrangements/Services Living arrangements for the past 2 months: Single Family Home Lives with:: Spouse Patient language and need for interpreter reviewed:: Yes        Need for Family Participation in Patient Care: Yes (Comment) Care giver support system in place?: Yes (comment)   Criminal Activity/Legal Involvement Pertinent to Current Situation/Hospitalization: No - Comment as needed  Activities of Daily Living   ADL Screening (condition at time of admission) Patient's cognitive ability adequate to safely complete daily activities?: Yes Is the patient deaf or have difficulty hearing?: No Does the patient have difficulty seeing, even when wearing glasses/contacts?: No Does the patient have difficulty concentrating, remembering, or making decisions?: No Patient able to express need for assistance with ADLs?: Yes Does the patient have difficulty dressing or bathing?:  No Independently performs ADLs?: Yes (appropriate for developmental age) Does the patient have difficulty walking or climbing stairs?: Yes Weakness of Legs: Both Weakness of Arms/Hands: None  Permission Sought/Granted                  Emotional Assessment Appearance:: Appears younger than stated age     Orientation: : Oriented to Self, Oriented to Place, Oriented to  Time   Psych Involvement: No (comment)  Admission diagnosis:  Shortness of breath [R06.02] Acute respiratory failure with hypoxia (HCC) [J96.01] Acute cough [R05.1] Patient Active Problem List   Diagnosis Date Noted   Acute on chronic systolic CHF (congestive heart failure) ([place]) 04/27/2022   Elevated troponin 04/27/2022   Sepsis ([place]) 04/27/2022   CAP (community acquired pneumonia) 04/27/2022   Thrombocytosis 04/27/2022   Acute respiratory failure with hypoxia ([place]) 04/26/2022   History of colonic polyps    Adenomatous polyp of ascending colon    Hyperparathyroidism, primary ([place]) 02/02/2021   Primary hyperparathyroidism ([place]) 02/02/2021   Osteoporosis without current pathological fracture 10/09/2019   Palpitations 08/28/2017   Essential hypertension 03/03/2017   Chest pain 62/22/9798   Chronic systolic heart failure ([place]) 08/29/2016   Cardiomyopathy, ischemic    Hyponatremia    Facial laceration    Coronary artery disease of native artery of native heart with stable angina pectoris ([place])    Hyperlipidemia  LDL goal <70 07/08/2016   Hypothyroidism 51/83/3582   Alcoholic intoxication without complication ([place])    Fall 07/07/2016   Abnormal EKG    PCP:  Loraine Leriche., MD Pharmacy:   [facility] [phone] [address], [address] AT [facility] [address] [place] [address] Phone: [phone] Fax: [phone]  [facility] [address] [place] [address] Phone: [phone] Fax: [phone]     Social  Determinants of Health (SDOH) Interventions    Readmission Risk Interventions     No data to display

## 2022-04-28 NOTE — Progress Notes (Addendum)
Mobility Specialist Progress Note    04/28/22 1437  Mobility  Activity Ambulated with assistance in hallway  Level of Assistance Minimal assist, patient does 75% or more  Assistive Device Front wheel walker  Distance Ambulated (ft) 270 ft  Activity Response Tolerated fair  $Mobility charge 1 Mobility   Pre-Mobility: 72 HR, 94% SpO2 During Mobility: 102 HR Post-Mobility: 86 HR, 95% SpO2  Pt received in bed and agreeable. C/o congestion. On 3LO2. Returned to bed with call bell in reach.    Hildred Alamin Mobility Specialist

## 2022-04-28 NOTE — Progress Notes (Signed)
ANTICOAGULATION CONSULT NOTE  Pharmacy Consult for heparin Indication: chest pain/ACS  Allergies  Allergen Reactions   Lisinopril Cough    Dry cough   Spironolactone Other (See Comments)    Low sodium    Patient Measurements: Height: '5\' 3"'$  (160 cm) Weight: 54.4 kg (119 lb 14.9 oz) IBW/kg (Calculated) : 52.4 Heparin Dosing Weight: 54.4kg  Vital Signs: Temp: 98.2 F (36.8 C) (09/27 2350) Temp Source: Oral (09/27 2350) BP: 107/55 (09/27 2350) Pulse Rate: 70 (09/27 2350)  Labs: Recent Labs    04/26/22 1555 04/26/22 1904 04/27/22 0252 04/27/22 0303 04/27/22 1600 04/27/22 2359  HGB 12.6  --  11.6* 11.6*  --   --   HCT 35.1*  --  34.3* 34.0*  --   --   PLT 469*  --  296  --   --   --   HEPARINUNFRC  --   --   --   --  0.10* 0.38  CREATININE 0.96  --  0.91  --   --  0.90  TROPONINIHS 184* 1,180* 938*  --   --   --      Estimated Creatinine Clearance: 48.1 mL/min (by C-G formula based on SCr of 0.9 mg/dL).   Medical History: Past Medical History:  Diagnosis Date   Abnormal EKG    AICD (automatic cardioverter/defibrillator) present    Alcoholic intoxication without complication (HCC)    Allergy    Cardiomyopathy, ischemic    Cataract    Chest pain 08/29/2016   CHF (congestive heart failure) (HCC)    Chronic systolic heart failure ([place]) 08/29/2016   Facial laceration    Fall 07/07/2016   Hyperlipidemia    Hypertension 03/03/2017   Hyponatremia    Hypothyroidism 07/08/2016   NSTEMI (non-ST elevated myocardial infarction) ([place])    Syncope    Thyroid disease     Medications:  Scheduled:   atorvastatin  80 mg Oral Daily   clopidogrel  75 mg Oral Daily   furosemide  40 mg Intravenous BID   guaiFENesin  600 mg Oral BID   levothyroxine  37.5 mcg Oral QAC breakfast   methylPREDNISolone (SOLU-MEDROL) injection  60 mg Intravenous Q12H   ranolazine  1,000 mg Oral BID   Assessment: 59 yoF with PMH of CAD (NSTEMI 2017), ICM, HFrEF s/p [place]-ICD, HTN, HLD,  hypothyoridism who presented with shortness of breath. Pharmacy consulted to dose heparin for ACS/NSTEMI. Troponins elevated to 938, Hgb 11.6, platelets WNL. No PTA anticoagulation  9/28 AM update:  Heparin level therapeutic after rate increase  Goal of Therapy:  Heparin level 0.3-0.7 units/ml Monitor platelets by anticoagulation protocol: Yes   Plan:  Cont heparin 800 units/hr 1000 heparin level  Narda Bonds, PharmD, BCPS Clinical Pharmacist Phone: [phone]

## 2022-04-28 NOTE — Evaluation (Signed)
Physical Therapy Evaluation Patient Details Name: Pam [name] MRN: [id number] DOB: 05/17/1951 Today's Date: 04/28/2022  History of Present Illness  Pt is a 71 y.o. female admitted 04/26/22 with cough, SOB. CXR suspicious for atypical PNA vs interstitial edema. Chest CT with advanced epmhysema, trace pleural effusion. Workup for acute hypoxic respiratory failure secondary to PNA, COPD, CHF, concern for NSTEMI. Plan for LHC. PMH includes CAD (s/p PCI), ischemic cardiomyopathy (EF 25-30%), COPD.   Clinical Impression  Pt presents with an overall decrease in functional mobility secondary to above. PTA, pt independent without DME, retired from work, performs majority of household tasks for her and her husband. Pt declined ambulation having walked "too far" earlier; educ on importance of shorter bouts of activity, more frequently throughout day, as well as energy conservation strategies, DME needs, incentive spirometer use (pt pulling ~850-1024m). Pt would benefit from continued acute PT services to maximize functional mobility and independence prior to d/c with HHPT services.      SpO2 92-95% on 3L O2 [place]   Recommendations for follow up therapy are one component of a multi-disciplinary discharge planning process, led by the attending physician.  Recommendations may be updated based on patient status, additional functional criteria and insurance authorization.  Follow Up Recommendations Home health PT (pt may decline)      Assistance Recommended at Discharge Intermittent Supervision/Assistance  Patient can return home with the following  A little help with bathing/dressing/bathroom;Assistance with cooking/housework;Help with stairs or ramp for entrance    Equipment Recommendations None recommended by PT  Recommendations for Other Services       Functional Status Assessment Patient has had a recent decline in their functional status and demonstrates the ability to make significant improvements in  function in a reasonable and predictable amount of time.     Precautions / Restrictions Precautions Precautions: Fall;Other (comment) Precaution Comments: Watch SpO2 (does not wear baseline) Restrictions Weight Bearing Restrictions: No      Mobility  Bed Mobility Overal bed mobility: Modified Independent             General bed mobility comments: Mod indep repositioning in bed; agreeable to bed placed in chair position    Transfers                   General transfer comment: pt declined    Ambulation/Gait                  Stairs            Wheelchair Mobility    Modified Rankin (Stroke Patients Only)       Balance Overall balance assessment: Needs assistance   Sitting balance-Leahy Scale: Fair                                       Pertinent Vitals/Pain Pain Assessment Pain Assessment: No/denies pain    Home Living Family/patient expects to be discharged to:: Private residence Living Arrangements: Spouse/significant other Available Help at Discharge: Family;Available 24 hours/day Type of Home: House Home Access: Stairs to enter Entrance Stairs-Rails: None Entrance Stairs-Number of Steps: 1   Home Layout: One level Home Equipment: None      Prior Function Prior Level of Function : Independent/Modified Independent;Driving             Mobility Comments: Independent without DME; retired from work. drives ADLs Comments: Performs majority of household tasks, enjoys it  Hand Dominance        Extremity/Trunk Assessment   Upper Extremity Assessment Upper Extremity Assessment: Generalized weakness    Lower Extremity Assessment Lower Extremity Assessment: Generalized weakness       Communication   Communication: No difficulties  Cognition Arousal/Alertness: Awake/alert Behavior During Therapy: WFL for tasks assessed/performed Overall Cognitive Status: Within Functional Limits for tasks assessed                                  General Comments: WFL for simple tasks, not formally assessed. Verbose with speech requiring some redirection        General Comments General comments (skin integrity, edema, etc.): pt's husband present and supportive, engaged in education. increased time educ re: activity recmomendations, DME needs, energy conservation strategies, incentive spirometer use. pt walked with mobility specialist earlier and thinks she "overdid it" - educ on shorter bouts of activity more frequently during day, importance of sitting up and out of bed    Exercises Other Exercises Other Exercises: incentive spirometer x8 - pt pulling ~[phone] mL with good technique, nonproductive cough   Assessment/Plan    PT Assessment Patient needs continued PT services  PT Problem List Decreased strength;Decreased activity tolerance;Decreased balance;Decreased mobility;Decreased knowledge of use of DME;Decreased safety awareness;Cardiopulmonary status limiting activity       PT Treatment Interventions DME instruction;Gait training;Stair training;Functional mobility training;Therapeutic activities;Therapeutic exercise;Balance training;Patient/family education    PT Goals (Current goals can be found in the Care Plan section)  Acute Rehab PT Goals Patient Stated Goal: return home, willing to consider HHPT PT Goal Formulation: With patient Time For Goal Achievement: 05/12/22 Potential to Achieve Goals: Good    Frequency Min 3X/week     Co-evaluation               AM-PAC PT "6 Clicks" Mobility  Outcome Measure Help needed turning from your back to your side while in a flat bed without using bedrails?: None Help needed moving from lying on your back to sitting on the side of a flat bed without using bedrails?: A Little Help needed moving to and from a bed to a chair (including a wheelchair)?: A Little Help needed standing up from a chair using your arms (e.g.,  wheelchair or bedside chair)?: A Little Help needed to walk in hospital room?: A Little Help needed climbing 3-5 steps with a railing? : A Little 6 Click Score: 19    End of Session Equipment Utilized During Treatment: Oxygen Activity Tolerance: Patient limited by fatigue Patient left: in bed;with call bell/phone within reach;with family/visitor present Nurse Communication: Mobility status PT Visit Diagnosis: Other abnormalities of gait and mobility (R26.89);Muscle weakness (generalized) (M62.81)    Time: 8786-7672 PT Time Calculation (min) (ACUTE ONLY): 21 min   Charges:   PT Evaluation $PT Eval Moderate Complexity: [place], PT, DPT Acute Rehabilitation Services  Personal: [facility] Office: [place] 04/28/2022, 5:58 PM

## 2022-04-29 ENCOUNTER — Encounter (HOSPITAL_COMMUNITY): Payer: Self-pay | Admitting: Family Medicine

## 2022-04-29 ENCOUNTER — Encounter (HOSPITAL_COMMUNITY)
Admission: EM | Disposition: A | Discharge status: Home / Self Care | Payer: Self-pay | Source: Home / Self Care | Attending: Internal Medicine

## 2022-04-29 DIAGNOSIS — I255 Ischemic cardiomyopathy: Secondary | ICD-10-CM | POA: Diagnosis not present

## 2022-04-29 DIAGNOSIS — R0602 Shortness of breath: Secondary | ICD-10-CM

## 2022-04-29 DIAGNOSIS — I25118 Atherosclerotic heart disease of native coronary artery with other forms of angina pectoris: Secondary | ICD-10-CM

## 2022-04-29 DIAGNOSIS — J9601 Acute respiratory failure with hypoxia: Secondary | ICD-10-CM | POA: Diagnosis not present

## 2022-04-29 DIAGNOSIS — R778 Other specified abnormalities of plasma proteins: Secondary | ICD-10-CM | POA: Diagnosis not present

## 2022-04-29 HISTORY — PX: LEFT HEART CATH AND CORONARY ANGIOGRAPHY: CATH118249

## 2022-04-29 LAB — BASIC METABOLIC PANEL
Anion gap: 9 (ref 5–15)
BUN: 23 mg/dL (ref 8–23)
CO2: 24 mmol/L (ref 22–32)
Calcium: 8.7 mg/dL — ABNORMAL LOW (ref 8.9–10.3)
Chloride: 97 mmol/L — ABNORMAL LOW (ref 98–111)
Creatinine, Ser: 0.87 mg/dL (ref 0.44–1.00)
GFR, Estimated: >60 mL/min (ref >60)
Glucose, Bld: 135 mg/dL — ABNORMAL HIGH (ref 70–99)
Potassium: 4.7 mmol/L (ref 3.5–5.1)
Sodium: 130 mmol/L — ABNORMAL LOW (ref 135–145)

## 2022-04-29 LAB — CBC
HCT: 27.5 % — ABNORMAL LOW (ref 36.0–46.0)
Hemoglobin: 9.8 g/dL — ABNORMAL LOW (ref 12.0–15.0)
MCH: 34.9 pg — ABNORMAL HIGH (ref 26.0–34.0)
MCHC: 35.6 g/dL (ref 30.0–36.0)
MCV: 97.9 fL (ref 80.0–100.0)
Platelets: 451 10*3/uL — ABNORMAL HIGH (ref 150–400)
RBC: 2.81 MIL/uL — ABNORMAL LOW (ref 3.87–5.11)
RDW: 14.1 % (ref 11.5–15.5)
WBC: 17.2 10*3/uL — ABNORMAL HIGH (ref 4.0–10.5)
nRBC: 0 % (ref 0.0–0.2)

## 2022-04-29 LAB — HEPARIN LEVEL (UNFRACTIONATED): Heparin Unfractionated: 0.36 IU/mL (ref 0.30–0.70)

## 2022-04-29 SURGERY — LEFT HEART CATH AND CORONARY ANGIOGRAPHY
Anesthesia: LOCAL

## 2022-04-29 MED ORDER — SODIUM CHLORIDE 0.9 % IV SOLN: INTRAVENOUS | Status: DC

## 2022-04-29 MED ORDER — FENTANYL CITRATE (PF) 100 MCG/2ML IJ SOLN
INTRAMUSCULAR | Status: AC
  Filled 2022-04-29: qty 2

## 2022-04-29 MED ORDER — HYDRALAZINE HCL 20 MG/ML IJ SOLN: 10.0000 mg | INTRAMUSCULAR | Status: AC | PRN

## 2022-04-29 MED ORDER — SODIUM CHLORIDE 0.9 % IV SOLN: 250.0000 mL | INTRAVENOUS | Status: DC | PRN

## 2022-04-29 MED ORDER — HEPARIN (PORCINE) IN NACL 1000-0.9 UT/500ML-% IV SOLN
INTRAVENOUS | Status: AC
  Filled 2022-04-29: qty 1000

## 2022-04-29 MED ORDER — ONDANSETRON HCL 4 MG/2ML IJ SOLN: 4.0000 mg | Freq: Four times a day (QID) | INTRAMUSCULAR | Status: DC | PRN

## 2022-04-29 MED ORDER — SODIUM CHLORIDE 0.9% FLUSH: 3.0000 mL | INTRAVENOUS | Status: DC | PRN

## 2022-04-29 MED ORDER — ASPIRIN 81 MG PO CHEW: 81.0000 mg | CHEWABLE_TABLET | ORAL | Status: DC

## 2022-04-29 MED ORDER — HEPARIN (PORCINE) IN NACL 1000-0.9 UT/500ML-% IV SOLN
INTRAVENOUS | Status: DC | PRN
  Administered 2022-04-29 (×2): 500 mL

## 2022-04-29 MED ORDER — METHYLPREDNISOLONE SODIUM SUCC 40 MG IJ SOLR
40.0000 mg | Freq: Two times a day (BID) | INTRAMUSCULAR | Status: DC
  Administered 2022-04-29 – 2022-04-30 (×2): 40 mg via INTRAVENOUS
  Filled 2022-04-29 (×2): qty 1

## 2022-04-29 MED ORDER — SODIUM CHLORIDE 0.9 % IV SOLN: INTRAVENOUS | Status: AC

## 2022-04-29 MED ORDER — MIDAZOLAM HCL 2 MG/2ML IJ SOLN
INTRAMUSCULAR | Status: AC
  Filled 2022-04-29: qty 2

## 2022-04-29 MED ORDER — SODIUM CHLORIDE 0.9% FLUSH
3.0000 mL | Freq: Two times a day (BID) | INTRAVENOUS | Status: DC
  Administered 2022-04-29 – 2022-05-01 (×5): 3 mL via INTRAVENOUS

## 2022-04-29 MED ORDER — SODIUM CHLORIDE 0.9 % IV SOLN
250.0000 mL | INTRAVENOUS | Status: DC | PRN
  Administered 2022-04-29: 250 mL via INTRAVENOUS

## 2022-04-29 MED ORDER — HEPARIN SODIUM (PORCINE) 5000 UNIT/ML IJ SOLN
5000.0000 [IU] | Freq: Three times a day (TID) | INTRAMUSCULAR | Status: DC
  Administered 2022-04-29 – 2022-05-02 (×8): 5000 [IU] via SUBCUTANEOUS
  Filled 2022-04-29 (×8): qty 1

## 2022-04-29 MED ORDER — LIDOCAINE HCL (PF) 1 % IJ SOLN
INTRAMUSCULAR | Status: AC
  Filled 2022-04-29: qty 30

## 2022-04-29 MED ORDER — IOHEXOL 350 MG/ML SOLN
INTRAVENOUS | Status: DC | PRN
  Administered 2022-04-29: 40 mL via INTRA_ARTERIAL

## 2022-04-29 MED ORDER — VERAPAMIL HCL 2.5 MG/ML IV SOLN
INTRAVENOUS | Status: AC
  Filled 2022-04-29: qty 2

## 2022-04-29 MED ORDER — LABETALOL HCL 5 MG/ML IV SOLN: 10.0000 mg | INTRAVENOUS | Status: AC | PRN

## 2022-04-29 MED ORDER — ASPIRIN 81 MG PO CHEW
81.0000 mg | CHEWABLE_TABLET | ORAL | Status: AC
  Administered 2022-04-29: 81 mg via ORAL
  Filled 2022-04-29: qty 1

## 2022-04-29 MED ORDER — HEPARIN SODIUM (PORCINE) 1000 UNIT/ML IJ SOLN
INTRAMUSCULAR | Status: AC
  Filled 2022-04-29: qty 10

## 2022-04-29 MED ORDER — LIDOCAINE HCL (PF) 1 % IJ SOLN
INTRAMUSCULAR | Status: DC | PRN
  Administered 2022-04-29: 2 mL

## 2022-04-29 MED ORDER — MIDAZOLAM HCL 2 MG/2ML IJ SOLN
INTRAMUSCULAR | Status: DC | PRN
  Administered 2022-04-29: 1 mg via INTRAVENOUS

## 2022-04-29 MED ORDER — FUROSEMIDE 10 MG/ML IJ SOLN
40.0000 mg | Freq: Once | INTRAMUSCULAR | Status: AC
  Administered 2022-04-29: 40 mg via INTRAVENOUS
  Filled 2022-04-29: qty 4

## 2022-04-29 MED ORDER — SODIUM CHLORIDE 0.9% FLUSH
3.0000 mL | Freq: Two times a day (BID) | INTRAVENOUS | Status: DC
  Administered 2022-04-29 – 2022-05-01 (×6): 3 mL via INTRAVENOUS

## 2022-04-29 MED ORDER — VERAPAMIL HCL 2.5 MG/ML IV SOLN
INTRAVENOUS | Status: DC | PRN
  Administered 2022-04-29: 10 mL via INTRA_ARTERIAL

## 2022-04-29 MED ORDER — FENTANYL CITRATE (PF) 100 MCG/2ML IJ SOLN
INTRAMUSCULAR | Status: DC | PRN
  Administered 2022-04-29: 25 ug via INTRAVENOUS

## 2022-04-29 MED ORDER — ACETAMINOPHEN 325 MG PO TABS
650.0000 mg | ORAL_TABLET | ORAL | Status: DC | PRN
  Administered 2022-04-29 – 2022-04-30 (×2): 650 mg via ORAL

## 2022-04-29 MED ORDER — HEPARIN SODIUM (PORCINE) 1000 UNIT/ML IJ SOLN
INTRAMUSCULAR | Status: DC | PRN
  Administered 2022-04-29: 3000 [IU] via INTRAVENOUS

## 2022-04-29 SURGICAL SUPPLY — 10 items
BAND ZEPHYR COMPRESS 30 LONG (HEMOSTASIS) IMPLANT
CATH OPTITORQUE TIG 4.0 5F (CATHETERS) IMPLANT
GLIDESHEATH SLEND SS 6F .021 (SHEATH) IMPLANT
GUIDEWIRE INQWIRE 1.5J.035X260 (WIRE) IMPLANT
INQWIRE 1.5J .035X260CM (WIRE) ×1
KIT HEART LEFT (KITS) ×1 IMPLANT
PACK CARDIAC CATHETERIZATION (CUSTOM PROCEDURE TRAY) ×1 IMPLANT
SHEATH PROBE COVER 6X72 (BAG) IMPLANT
TRANSDUCER W/STOPCOCK (MISCELLANEOUS) ×1 IMPLANT
TUBING CIL FLEX 10 FLL-RA (TUBING) ×1 IMPLANT

## 2022-04-29 NOTE — Interval H&P Note (Signed)
  History and Physical Interval Note:  04/29/2022 1:47 PM  Pam [name]  has presented today for surgery, with the diagnosis of Fat; elevated troponin concerning for non-STEMI; CHF exacerbation.   The various methods of treatment have been discussed with the patient and family. After consideration of risks, benefits and other options for treatment, the patient has consented to  Procedure(s): LEFT HEART CATH AND CORONARY ANGIOGRAPHY (N/A)  PERCUTANEOUS CORONARY INTERVENTION   as a surgical intervention.  The patient's history has been reviewed, patient examined, no change in status, stable for surgery.  I have reviewed the patient's chart and labs.  Questions were answered to the patient's satisfaction.     Cath Lab Visit (complete for each Cath Lab visit)  Clinical Evaluation Leading to the Procedure:   ACS: Yes.   - ++ Troponin     Glenetta Hew

## 2022-04-29 NOTE — Progress Notes (Addendum)
PROGRESS NOTE    Pam [name]  MWN:[id number] DOB: 05/07/1951 DOA: 04/26/2022 PCP: Loraine Leriche., MD  70/F with history of CAD, PCI and stents, ischemic cardiomyopathy, EF 25-30%, COPD, hypothyroidism presented to the ED with cough, shortness of breath. -In the ER temp was 100.5, tachypneic placed on BiPAP -Chest x-ray noted diffuse reticulonodular infiltrates suspicious for atypical pneumonia versus interstitial edema -CT chest noted advanced emphysema, interlobar septal thickening, right middle lobe infiltrate and trace pleural effusion -Troponin was 184, trended up to 1180   Subjective: -Feels better, breathing is improving  Assessment and Plan:  Acute respiratory failure with hypoxia (HCC) -multifactorial, secondary to pneumonia/COPD and CHF -Weaned off BiPAP, wean O2 as tolerated-down to 2 L now -Cut down IV steroids, continue Rocephin/azithromycin day 3, continue DuoNebs  NSTEMI - known 2-vessel CAD including heavily calcified chronic total occlusion of the proximal/mid LAD, calcified 80% stenosis of the proximal Lcx/OM1, medical management recommended then -Troponin  trended up to 1180, continue IV heparin -Cards following, plan for left heart cath t today -Continue Ranexa, Plavix, statin -Echo with EF 25% with regional wall motion abnormalities  Acute on chronic systolic CHF -Nonischemic cardiomyopathy, EF 25 to 30% and prior PCI, stents -Repeat echo as noted above, EF 25% with regional wall motion abnormality -Repeat IV Lasix x1, continue Jardiance -allergic to Aldactone -Coreg and losartan on hold, resume post cath if BP/creat stable  Community-acquired pneumonia Sepsis, POA COPD exacerbation -Ceftriaxone, azithromycin -IV steroids, DuoNebs, cut down steroids -Blood cultures are negative  Hyponatremia -Likely secondary to CHF and possibly SIADH, monitor  Hypothyroidism Continue home levothyroxine   DVT prophylaxis: IV heparin Code Status: Full  code Family Communication: Discussed with patient detail, no family at bedside Disposition Plan: Home likely 2 to 3 days  Consultants: Cardiology   Procedures:   Antimicrobials:    Objective: Vitals:   04/28/22 2332 04/29/22 0320 04/29/22 0805 04/29/22 0918  BP: (!) 116/52 131/64 (!) 111/52   Pulse: 70 91 78 99  Resp: 16 16 (!) 21 19  Temp: 98.1 F (36.7 C) 97.9 F (36.6 C) 98 F (36.7 C)   TempSrc: Oral Oral Oral   SpO2: 99% 96% 92% 93%  Weight:      Height:        Intake/Output Summary (Last 24 hours) at 04/29/2022 0925 Last data filed at 04/29/2022 0730 Gross per 24 hour  Intake 920.85 ml  Output 1100 ml  Net -179.15 ml   Filed Weights   04/26/22 1501 04/27/22 0800  Weight: 54.4 kg 54.4 kg    Examination:  General exam: Pleasant chronically ill female sitting up in bed, AAOx3, no distress HEENT: no JVD CVS: S1-S2, regular rhythm Lungs: Improved air movement, few scattered rhonchi, no wheezes today Abdomen: Soft, nontender, bowel sounds present Extremities: Trace edema  Skin: No rashes Psychiatry: Anxious    Data Reviewed:   CBC: Recent Labs  Lab 04/26/22 1555 04/27/22 0252 04/27/22 0303 04/27/22 2359 04/29/22 0005  WBC 13.2* 11.0*  --  14.2* 17.2*  NEUTROABS 9.1*  --   --   --   --   HGB 12.6 11.6* 11.6* 10.6* 9.8*  HCT 35.1* 34.3* 34.0* 28.8* 27.5*  MCV 98.0 104.9*  --  96.0 97.9  PLT 469* 296  --  394 403*   Basic Metabolic Panel: Recent Labs  Lab 04/26/22 1555 04/27/22 0252 04/27/22 0303 04/27/22 2359 04/29/22 0005  NA 126* 131* 129* 131* 130*  K 4.7 4.4 3.8 3.6 4.7  CL 90*  96*  --  98 97*  CO2 26 18*  --  22 24  GLUCOSE 138* 93  --  114* 135*  BUN 13 14  --  15 23  CREATININE 0.96 0.91  --  0.90 0.87  CALCIUM 9.0 8.9  --  8.5* 8.7*  MG 1.8  --   --   --   --    GFR: Estimated Creatinine Clearance: 49.8 mL/min (by C-G formula based on SCr of 0.87 mg/dL). Liver Function Tests: Recent Labs  Lab 04/26/22 1555  AST 36  ALT  44  ALKPHOS 129*  BILITOT 0.6  PROT 6.9  ALBUMIN 3.3*   Recent Labs  Lab 04/26/22 1555  LIPASE 33   No results for input(s): "AMMONIA" in the last 168 hours. Coagulation Profile: No results for input(s): "INR", "PROTIME" in the last 168 hours. Cardiac Enzymes: No results for input(s): "CKTOTAL", "CKMB", "CKMBINDEX", "TROPONINI" in the last 168 hours. BNP (last 3 results) No results for input(s): "PROBNP" in the last 8760 hours. HbA1C: No results for input(s): "HGBA1C" in the last 72 hours. CBG: No results for input(s): "GLUCAP" in the last 168 hours. Lipid Profile: No results for input(s): "CHOL", "HDL", "LDLCALC", "TRIG", "CHOLHDL", "LDLDIRECT" in the last 72 hours. Thyroid Function Tests: No results for input(s): "TSH", "T4TOTAL", "FREET4", "T3FREE", "THYROIDAB" in the last 72 hours. Anemia Panel: No results for input(s): "VITAMINB12", "FOLATE", "FERRITIN", "TIBC", "IRON", "RETICCTPCT" in the last 72 hours. Urine analysis: No results found for: "COLORURINE", "APPEARANCEUR", "LABSPEC", "PHURINE", "GLUCOSEU", "HGBUR", "BILIRUBINUR", "KETONESUR", "PROTEINUR", "UROBILINOGEN", "NITRITE", "LEUKOCYTESUR" Sepsis Labs: '@LABRCNTIP'$ (procalcitonin:4,lacticidven:4)  ) Recent Results (from the past 240 hour(s))  SARS Coronavirus 2 by RT PCR (hospital order, performed in [facility] hospital lab) *cepheid single result test* Anterior Nasal Swab     Status: None   Collection Time: 04/26/22  3:18 PM   Specimen: Anterior Nasal Swab  Result Value Ref Range Status   SARS Coronavirus 2 by RT PCR NEGATIVE NEGATIVE Final    Comment: (NOTE) SARS-CoV-2 target nucleic acids are NOT DETECTED.  The SARS-CoV-2 RNA is generally detectable in upper and lower respiratory specimens during the acute phase of infection. The lowest concentration of SARS-CoV-2 viral copies this assay can detect is 250 copies / mL. A negative result does not preclude SARS-CoV-2 infection and should not be used as the sole  basis for treatment or other patient management decisions.  A negative result may occur with improper specimen collection / handling, submission of specimen other than nasopharyngeal swab, presence of viral mutation(s) within the areas targeted by this assay, and inadequate number of viral copies (<250 copies / mL). A negative result must be combined with clinical observations, patient history, and epidemiological information.  Fact Sheet for Patients:   https://www.patel.info/  Fact Sheet for Healthcare Providers: https://hall.com/  This test is not yet approved or  cleared by the [place] FDA and has been authorized for detection and/or diagnosis of SARS-CoV-2 by FDA under an Emergency Use Authorization (EUA).  This EUA will remain in effect (meaning this test can be used) for the duration of the COVID-19 declaration under Section 564(b)(1) of the Act, 21 U.S.C. section 360bbb-3(b)(1), unless the authorization is terminated or revoked sooner.  Performed at [facility], [place] [address], [place], [place] [address]   Blood culture (routine x 2)     Status: None (Preliminary result)   Collection Time: 04/26/22  3:21 PM   Specimen: BLOOD  Result Value Ref Range Status   Specimen  Description BLOOD RIGHT ANTECUBITAL  Final   Special Requests   Final    BOTTLES DRAWN AEROBIC AND ANAEROBIC Blood Culture adequate volume   Culture   Final    NO GROWTH 3 DAYS Performed at [facility], [address] [place], [place] [address]    Report Status PENDING  Incomplete  Blood culture (routine x 2)     Status: None (Preliminary result)   Collection Time: 04/26/22  3:26 PM   Specimen: BLOOD  Result Value Ref Range Status   Specimen Description BLOOD LEFT ANTECUBITAL  Final   Special Requests   Final    BOTTLES DRAWN AEROBIC AND ANAEROBIC Blood Culture results may not be optimal due to an excessive volume of blood received in culture  bottles   Culture   Final    NO GROWTH 3 DAYS Performed at [facility], [place] [address] [place], [place] [address]    Report Status PENDING  Incomplete  MRSA Next Gen by PCR, Nasal     Status: None   Collection Time: 04/27/22  7:08 PM   Specimen: Nasal Mucosa; Nasal Swab  Result Value Ref Range Status   MRSA by PCR Next Gen NOT DETECTED NOT DETECTED Final    Comment: (NOTE) The GeneXpert MRSA Assay (FDA approved for NASAL specimens only), is one component of a comprehensive MRSA colonization surveillance program. It is not intended to diagnose MRSA infection nor to guide or monitor treatment for MRSA infections. Test performance is not FDA approved in patients less than 72 years old. Performed at [facility], [place] [address], [place], [place] [address]      Radiology Studies: ECHOCARDIOGRAM COMPLETE  Result Date: 04/27/2022    ECHOCARDIOGRAM REPORT   Patient Name:   Pam [name] Date of Exam: 04/27/2022 Medical Rec #:  [id number]   Height:       63.0 in Accession #:    [id number]  Weight:       119.9 lb Date of Birth:  08-Nov-1950  BSA:          1.556 m Patient Age:    3 years    BP:           118/56 mmHg Patient Gender: F           HR:           79 bpm. Exam Location:  Inpatient Procedure: 2D Echo, Cardiac Doppler, Color Doppler and Intracardiac            Opacification Agent Indications:    CHF  History:        Patient has prior history of Echocardiogram examinations, most                 recent 06/19/2019. CHF and Cardiomyopathy, CAD, Abnormal ECG,                 Signs/Symptoms:Chest Pain; Risk Factors:Hypertension and                 Dyslipidemia.  Sonographer:    Greer Pickerel Referring Phys: [id number] [place] T TU  Sonographer Comments: Technically challenging study due to limited acoustic windows, suboptimal parasternal window and no subcostal window. Image acquisition challenging due to patient body habitus and Image acquisition challenging due to respiratory motion.  IMPRESSIONS  1. Left ventricular ejection fraction, by estimation, is 25 to 30%. The left ventricle has severely decreased function. The left ventricle demonstrates regional wall motion abnormalities (see scoring diagram/findings  for description). Left ventricular diastolic parameters are consistent with Grade II diastolic dysfunction (pseudonormalization). Elevated left ventricular end-diastolic pressure.  2. Right ventricular systolic function is normal. The right ventricular size is normal. There is mildly elevated pulmonary artery systolic pressure.  3. Left atrial size was moderately dilated.  4. The mitral valve is normal in structure. Trivial mitral valve regurgitation. No evidence of mitral stenosis.  5. The aortic valve is normal in structure. Aortic valve regurgitation is not visualized. No aortic stenosis is present.  6. The inferior vena cava is normal in size with greater than 50% respiratory variability, suggesting right atrial pressure of 3 mmHg. FINDINGS  Left Ventricle: Left ventricular ejection fraction, by estimation, is 25 to 30%. The left ventricle has severely decreased function. The left ventricle demonstrates regional wall motion abnormalities. Definity contrast agent was given IV to delineate the left ventricular endocardial borders. The left ventricular internal cavity size was normal in size. There is no left ventricular hypertrophy. Left ventricular diastolic parameters are consistent with Grade II diastolic dysfunction (pseudonormalization). Elevated left ventricular end-diastolic pressure.  LV Wall Scoring: The apical inferior segment and apex are akinetic. The mid and distal anterior wall, mid and distal lateral wall, mid and distal anterior septum, inferior wall, and mid inferoseptal segment are hypokinetic. The antero-lateral wall, basal anteroseptal segment, basal inferolateral segment, basal anterior segment, and basal inferoseptal segment are normal. Right Ventricle: The right  ventricular size is normal. No increase in right ventricular wall thickness. Right ventricular systolic function is normal. There is mildly elevated pulmonary artery systolic pressure. The tricuspid regurgitant velocity is 2.88  m/s, and with an assumed right atrial pressure of 8 mmHg, the estimated right ventricular systolic pressure is 16.1 mmHg. Left Atrium: Left atrial size was moderately dilated. Right Atrium: Right atrial size was normal in size. Pericardium: There is no evidence of pericardial effusion. Mitral Valve: The mitral valve is normal in structure. Trivial mitral valve regurgitation. No evidence of mitral valve stenosis. Tricuspid Valve: The tricuspid valve is normal in structure. Tricuspid valve regurgitation is mild . No evidence of tricuspid stenosis. Aortic Valve: The aortic valve is normal in structure. Aortic valve regurgitation is not visualized. No aortic stenosis is present. Pulmonic Valve: The pulmonic valve was normal in structure. Pulmonic valve regurgitation is not visualized. No evidence of pulmonic stenosis. Aorta: The aortic root is normal in size and structure. Venous: The inferior vena cava is normal in size with greater than 50% respiratory variability, suggesting right atrial pressure of 3 mmHg. IAS/Shunts: No atrial level shunt detected by color flow Doppler. Additional Comments: A device lead is visualized.  LEFT VENTRICLE PLAX 2D LVOT diam:     0.85 cm     Diastology LV SV:         10          LV e' medial:    5.10 cm/s LV SV Index:   7           LV E/e' medial:  17.3 LVOT Area:     0.57 cm    LV e' lateral:   4.95 cm/s                            LV E/e' lateral: 17.8  LV Volumes (MOD) LV vol d, MOD A2C: 79.9 ml LV vol d, MOD A4C: 77.5 ml LV vol s, MOD A2C: 49.6 ml LV vol s, MOD A4C: 60.7 ml LV SV MOD  A2C:     30.3 ml LV SV MOD A4C:     77.5 ml LV SV MOD BP:      24.0 ml RIGHT VENTRICLE RV S prime:     14.20 cm/s TAPSE (M-mode): 1.5 cm LEFT ATRIUM           Index        RIGHT  ATRIUM           Index LA Vol (A4C): 55.4 ml 35.61 ml/m  RA Area:     12.70 cm                                    RA Volume:   30.10 ml  19.35 ml/m  AORTIC VALVE LVOT Vmax:   108.00 cm/s LVOT Vmean:  66.100 cm/s LVOT VTI:    0.185 m  AORTA Ao Root diam: 2.30 cm MITRAL VALVE               TRICUSPID VALVE MV Area (PHT): 3.72 cm    TR Peak grad:   33.2 mmHg MV Decel Time: 204 msec    TR Vmax:        288.00 cm/s MV E velocity: 88.30 cm/s MV A velocity: 81.80 cm/s  SHUNTS MV E/A ratio:  1.08        Systemic VTI:  0.18 m                            Systemic Diam: 0.85 cm Skeet Latch MD Electronically signed by Skeet Latch MD Signature Date/Time: 04/27/2022/2:44:38 PM    Final      Scheduled Meds:  [START ON 04/30/2022] aspirin  81 mg Oral Pre-Cath   atorvastatin  80 mg Oral Daily   clopidogrel  75 mg Oral Daily   empagliflozin  10 mg Oral Daily   guaiFENesin  600 mg Oral BID   levothyroxine  37.5 mcg Oral QAC breakfast   methylPREDNISolone (SOLU-MEDROL) injection  40 mg Intravenous Q12H   pneumococcal 20-valent conjugate vaccine  0.5 mL Intramuscular Tomorrow-1000   ranolazine  1,000 mg Oral BID   sodium chloride flush  3 mL Intravenous Q12H   valACYclovir  1,000 mg Oral Daily   Continuous Infusions:  sodium chloride     [START ON 04/30/2022] sodium chloride     azithromycin 500 mg (04/28/22 1954)   cefTRIAXone (ROCEPHIN)  IV 1 g (04/28/22 1807)   heparin 900 Units/hr (04/29/22 0324)     LOS: 3 days    Time spent: 106mn    PDomenic Polite MD [facility]   04/29/2022, 9:25 AM

## 2022-04-29 NOTE — Progress Notes (Signed)
ANTICOAGULATION CONSULT NOTE  Pharmacy Consult for heparin Indication: chest pain/ACS  Allergies  Allergen Reactions   Lisinopril Cough    Dry cough   Spironolactone Other (See Comments)    Low sodium    Patient Measurements: Height: '5\' 3"'$  (160 cm) Weight: 54.4 kg (119 lb 14.9 oz) IBW/kg (Calculated) : 52.4 Heparin Dosing Weight: 54.4kg  Vital Signs: Temp: 98 F (36.7 C) (09/29 0805) Temp Source: Oral (09/29 0805) BP: 111/52 (09/29 0805) Pulse Rate: 99 (09/29 0918)  Labs: Recent Labs    04/26/22 1555 04/26/22 1904 04/27/22 0252 04/27/22 0303 04/27/22 1600 04/27/22 2359 04/28/22 1140 04/28/22 2054 04/29/22 0005  HGB 12.6  --  11.6* 11.6*  --  10.6*  --   --  9.8*  HCT 35.1*  --  34.3* 34.0*  --  28.8*  --   --  27.5*  PLT 469*  --  296  --   --  394  --   --  451*  HEPARINUNFRC  --   --   --   --    < > 0.38 0.25* 0.34 0.36  CREATININE 0.96  --  0.91  --   --  0.90  --   --  0.87  TROPONINIHS 184* 1,180* 938*  --   --   --   --   --   --    < > = values in this interval not displayed.     Estimated Creatinine Clearance: 49.8 mL/min (by C-G formula based on SCr of 0.87 mg/dL).   Medical History: Past Medical History:  Diagnosis Date   Abnormal EKG    AICD (automatic cardioverter/defibrillator) present    Alcoholic intoxication without complication (HCC)    Allergy    Cardiomyopathy, ischemic    Cataract    Chest pain 08/29/2016   CHF (congestive heart failure) (HCC)    Chronic systolic heart failure ([place]) 08/29/2016   Facial laceration    Fall 07/07/2016   Hyperlipidemia    Hypertension 03/03/2017   Hyponatremia    Hypothyroidism 07/08/2016   NSTEMI (non-ST elevated myocardial infarction) ([place])    Syncope    Thyroid disease     Medications:  Scheduled:   [START ON 04/30/2022] aspirin  81 mg Oral Pre-Cath   atorvastatin  80 mg Oral Daily   clopidogrel  75 mg Oral Daily   empagliflozin  10 mg Oral Daily   guaiFENesin  600 mg Oral BID    levothyroxine  37.5 mcg Oral QAC breakfast   methylPREDNISolone (SOLU-MEDROL) injection  40 mg Intravenous Q12H   pneumococcal 20-valent conjugate vaccine  0.5 mL Intramuscular Tomorrow-1000   ranolazine  1,000 mg Oral BID   sodium chloride flush  3 mL Intravenous Q12H   valACYclovir  1,000 mg Oral Daily   Assessment: 59 yoF with PMH of CAD (NSTEMI 2017), ICM, HFrEF s/p [place]-ICD, HTN, HLD, hypothyoridism who presented with shortness of breath. Pharmacy consulted to dose heparin for ACS/NSTEMI. No PTA anticoagulation  Heparin level is therapeutic at 0.36, on 900 units/hr. Hgb 9.8, plt 451. No s/sx of bleeding or infusion issues.  Goal of Therapy:  Heparin level 0.3-0.7 units/ml Monitor platelets by anticoagulation protocol: Yes   Plan:  Continue heparin infusion at 900 units/hr Monitor daily HL, CBC, and for s/sx of bleeding   Antonietta Jewel, PharmD, [place] Pharmacist  Phone: [phone] 04/29/2022 9:43 AM  Please check AMION for all Index phone numbers After 10:00 PM, call [place] [phone]

## 2022-04-29 NOTE — Progress Notes (Signed)
Heart Failure Stewardship Pharmacist Progress Note   PCP: Loraine Leriche., MD PCP-Cardiologist: Lauree Chandler, MD    HPI:  71 yo F with PMH of CAD, CHF, ICM, HTN, HLD, hypothyroidism, and osteoporosis.  She was admitted to [facility] in December 2017 with a NSTEMI. She was found to have a chronic occlusion of the mid LAD and severe, calcified stenosis in the proximal Circumflex.  LVEF was 30% by LV gram and 35-40% by echo. Cardiac MRI December 2017 with non-viability of the anterior wall in the LAD territory. LVEF 38% by cardiac MRI. The Circumflex stenosis was treated with orbital atherectomy and stenting using a drug eluting stent in a staged procedure in January 2018. Echo November 2020 with LVEF 25-30%. Dilated LV. Mild MR. ICD placed in January 2021 and followed by Dr. Lennie Odor  She presented to the ED on 9/26 with shortness of breath, hypoxia, and a productive cough. CXR with PNA vs interstitial edema. CTA negative for PE and favoring infection. Started antibiotics for PNA. An ECHO was done on 9/27 and LVEF was 25-30% with G2DD, RV normal, and trivial MR. L/RHC scheduled for today.  Current HF Medications: Diuretic: furosemide 40 mg IV x 1 today SGLT2i: Jardiance 10 mg daily  Prior to admission HF Medications: Diuretic: furosemide 20 mg MWF Beta blocker: carvedilol 6.25 mg BID ACE/ARB/ARNI: losartan 100 mg daily  Pertinent Lab Values: Serum creatinine 0.87, BUN 23, Potassium 4.7, Sodium 130, BNP 713.3, Magnesium 1.8   Vital Signs: Weight: 119 lbs (admission weight: 119 lbs) Blood pressure: 110/50s  Heart rate: 70s  I/O: -1.5L yesterday  Medication Assistance / Insurance Benefits Check: Does the patient have prescription insurance?  Yes Type of insurance plan: [place]:  Prior to admission outpatient pharmacy: [facility] Is the patient willing to use [place] at discharge? Yes Is the patient willing to transition their  outpatient pharmacy to utilize a [facility] outpatient pharmacy?   Pending    Assessment: 1. Acute on chronic systolic CHF (LVEF 94-32%), due to ICM. NYHA class II symptoms. - Furosemide 40 mg IV x 1 today. Strict I/Os and daily weights. Weight has not been documented since admission. Keep K>4. - PTA carvedilol and losartan were on hold with wheezing and low BP, consider resuming post-cath - Consider adding spironolactone 12.5 mg daily prior to discharge if BP improves - Continue Jardiance 10 mg daily   Plan: 1) Medication changes recommended at this time: - Resume carvedilol and losartan post cath  2) Patient assistance: - Has [employer] plan - should be able to use monthly copay cards  3)  Education  - Patient has been educated on current HF medications and potential additions to HF medication regimen - Patient verbalizes understanding that over the next few months, these medication doses may change and more medications may be added to optimize HF regimen - Patient has been educated on basic disease state pathophysiology and goals of therapy   Kerby Nora, PharmD, BCPS Heart Failure Cytogeneticist Phone [phone]

## 2022-04-29 NOTE — Progress Notes (Signed)
Primary Cardiologist:  McAlhany/Camnitz  Subjective:  She looks much better Not tachypnic has ambulated with sats ok She ate breakfast   Objective:  Vitals:   04/28/22 1930 04/28/22 2332 04/29/22 0320 04/29/22 0805  BP: (!) 120/58 (!) 116/52 131/64 (!) 111/52  Pulse: 75 70 91 78  Resp: '15 16 16 '$ (!) 21  Temp: 98 F (36.7 C) 98.1 F (36.7 C) 97.9 F (36.6 C) 98 F (36.7 C)  TempSrc: Oral Oral Oral Oral  SpO2: 95% 99% 96% 92%  Weight:      Height:        Intake/Output from previous day:  Intake/Output Summary (Last 24 hours) at 04/29/2022 0850 Last data filed at 04/29/2022 0324 Gross per 24 hour  Intake 920.85 ml  Output 1300 ml  Net -379.15 ml    Physical Exam: Chronically ill thin white female Lungs no wheezing this am  No murmur AICD under left clavicle  Abdomen benign No edema  Lab Results: Basic Metabolic Panel: Recent Labs    04/26/22 1555 04/27/22 0252 04/27/22 2359 04/29/22 0005  NA 126*   < > 131* 130*  K 4.7   < > 3.6 4.7  CL 90*   < > 98 97*  CO2 26   < > 22 24  GLUCOSE 138*   < > 114* 135*  BUN 13   < > 15 23  CREATININE 0.96   < > 0.90 0.87  CALCIUM 9.0   < > 8.5* 8.7*  MG 1.8  --   --   --    < > = values in this interval not displayed.   Liver Function Tests: Recent Labs    04/26/22 1555  AST 36  ALT 44  ALKPHOS 129*  BILITOT 0.6  PROT 6.9  ALBUMIN 3.3*   Recent Labs    04/26/22 1555  LIPASE 33   CBC: Recent Labs    04/26/22 1555 04/27/22 0252 04/27/22 2359 04/29/22 0005  WBC 13.2*   < > 14.2* 17.2*  NEUTROABS 9.1*  --   --   --   HGB 12.6   < > 10.6* 9.8*  HCT 35.1*   < > 28.8* 27.5*  MCV 98.0   < > 96.0 97.9  PLT 469*   < > 394 451*   < > = values in this interval not displayed.     Imaging: ECHOCARDIOGRAM COMPLETE  Result Date: 04/27/2022    ECHOCARDIOGRAM REPORT   Patient Name:   Pam [name] Date of Exam: 04/27/2022 Medical Rec #:  [id number]   Height:       63.0 in Accession #:    [id number]  Weight:        119.9 lb Date of Birth:  [birth year]  BSA:          1.556 m Patient Age:    88 years    BP:           118/56 mmHg Patient Gender: F           HR:           79 bpm. Exam Location:  Inpatient Procedure: 2D Echo, Cardiac Doppler, Color Doppler and Intracardiac            Opacification Agent Indications:    CHF  History:        Patient has prior history of Echocardiogram examinations, most                 recent 06/19/2019.  CHF and Cardiomyopathy, CAD, Abnormal ECG,                 Signs/Symptoms:Chest Pain; Risk Factors:Hypertension and                 Dyslipidemia.  Sonographer:    Greer Pickerel Referring Phys: [id number] [place] T TU  Sonographer Comments: Technically challenging study due to limited acoustic windows, suboptimal parasternal window and no subcostal window. Image acquisition challenging due to patient body habitus and Image acquisition challenging due to respiratory motion. IMPRESSIONS  1. Left ventricular ejection fraction, by estimation, is 25 to 30%. The left ventricle has severely decreased function. The left ventricle demonstrates regional wall motion abnormalities (see scoring diagram/findings for description). Left ventricular diastolic parameters are consistent with Grade II diastolic dysfunction (pseudonormalization). Elevated left ventricular end-diastolic pressure.  2. Right ventricular systolic function is normal. The right ventricular size is normal. There is mildly elevated pulmonary artery systolic pressure.  3. Left atrial size was moderately dilated.  4. The mitral valve is normal in structure. Trivial mitral valve regurgitation. No evidence of mitral stenosis.  5. The aortic valve is normal in structure. Aortic valve regurgitation is not visualized. No aortic stenosis is present.  6. The inferior vena cava is normal in size with greater than 50% respiratory variability, suggesting right atrial pressure of 3 mmHg. FINDINGS  Left Ventricle: Left ventricular ejection fraction, by estimation,  is 25 to 30%. The left ventricle has severely decreased function. The left ventricle demonstrates regional wall motion abnormalities. Definity contrast agent was given IV to delineate the left ventricular endocardial borders. The left ventricular internal cavity size was normal in size. There is no left ventricular hypertrophy. Left ventricular diastolic parameters are consistent with Grade II diastolic dysfunction (pseudonormalization). Elevated left ventricular end-diastolic pressure.  LV Wall Scoring: The apical inferior segment and apex are akinetic. The mid and distal anterior wall, mid and distal lateral wall, mid and distal anterior septum, inferior wall, and mid inferoseptal segment are hypokinetic. The antero-lateral wall, basal anteroseptal segment, basal inferolateral segment, basal anterior segment, and basal inferoseptal segment are normal. Right Ventricle: The right ventricular size is normal. No increase in right ventricular wall thickness. Right ventricular systolic function is normal. There is mildly elevated pulmonary artery systolic pressure. The tricuspid regurgitant velocity is 2.88  m/s, and with an assumed right atrial pressure of 8 mmHg, the estimated right ventricular systolic pressure is 26.8 mmHg. Left Atrium: Left atrial size was moderately dilated. Right Atrium: Right atrial size was normal in size. Pericardium: There is no evidence of pericardial effusion. Mitral Valve: The mitral valve is normal in structure. Trivial mitral valve regurgitation. No evidence of mitral valve stenosis. Tricuspid Valve: The tricuspid valve is normal in structure. Tricuspid valve regurgitation is mild . No evidence of tricuspid stenosis. Aortic Valve: The aortic valve is normal in structure. Aortic valve regurgitation is not visualized. No aortic stenosis is present. Pulmonic Valve: The pulmonic valve was normal in structure. Pulmonic valve regurgitation is not visualized. No evidence of pulmonic stenosis.  Aorta: The aortic root is normal in size and structure. Venous: The inferior vena cava is normal in size with greater than 50% respiratory variability, suggesting right atrial pressure of 3 mmHg. IAS/Shunts: No atrial level shunt detected by color flow Doppler. Additional Comments: A device lead is visualized.  LEFT VENTRICLE PLAX 2D LVOT diam:     0.85 cm     Diastology LV SV:  10          LV e' medial:    5.10 cm/s LV SV Index:   7           LV E/e' medial:  17.3 LVOT Area:     0.57 cm    LV e' lateral:   4.95 cm/s                            LV E/e' lateral: 17.8  LV Volumes (MOD) LV vol d, MOD A2C: 79.9 ml LV vol d, MOD A4C: 77.5 ml LV vol s, MOD A2C: 49.6 ml LV vol s, MOD A4C: 60.7 ml LV SV MOD A2C:     30.3 ml LV SV MOD A4C:     77.5 ml LV SV MOD BP:      24.0 ml RIGHT VENTRICLE RV S prime:     14.20 cm/s TAPSE (M-mode): 1.5 cm LEFT ATRIUM           Index        RIGHT ATRIUM           Index LA Vol (A4C): 55.4 ml 35.61 ml/m  RA Area:     12.70 cm                                    RA Volume:   30.10 ml  19.35 ml/m  AORTIC VALVE LVOT Vmax:   108.00 cm/s LVOT Vmean:  66.100 cm/s LVOT VTI:    0.185 m  AORTA Ao Root diam: 2.30 cm MITRAL VALVE               TRICUSPID VALVE MV Area (PHT): 3.72 cm    TR Peak grad:   33.2 mmHg MV Decel Time: 204 msec    TR Vmax:        288.00 cm/s MV E velocity: 88.30 cm/s MV A velocity: 81.80 cm/s  SHUNTS MV E/A ratio:  1.08        Systemic VTI:  0.18 m                            Systemic Diam: 0.85 cm Skeet Latch MD Electronically signed by Skeet Latch MD Signature Date/Time: 04/27/2022/2:44:38 PM    Final     Cardiac Studies:  ECG: SR non specific ST changes    Telemetry:  NSR   Echo: EF 25-30% normal RV no significant valve dx  Medications:    atorvastatin  80 mg Oral Daily   clopidogrel  75 mg Oral Daily   empagliflozin  10 mg Oral Daily   guaiFENesin  600 mg Oral BID   levothyroxine  37.5 mcg Oral QAC breakfast   methylPREDNISolone (SOLU-MEDROL)  injection  40 mg Intravenous Q12H   pneumococcal 20-valent conjugate vaccine  0.5 mL Intramuscular Tomorrow-1000   ranolazine  1,000 mg Oral BID   sodium chloride flush  3 mL Intravenous Q12H   valACYclovir  1,000 mg Oral Daily      azithromycin 500 mg (04/28/22 1954)   cefTRIAXone (ROCEPHIN)  IV 1 g (04/28/22 1807)   heparin 900 Units/hr (04/29/22 0324)    Assessment/Plan:  Pam [name] is a 71 y.o. female with a hx of CAD, ICM, HFrEFs/p [place]-ICD, HTN, HLD, and hypothyroidism who is being seen 04/26/2022 for the evaluation of heart failure  and troponin elevation at the request of the ED.  Ischemic DCM:  known CTO LAD by cath 2017 no viability anterior wall 1/18 had orbital atherectomy  And stenting of LCX TTE shows same EF as TTE 2020 25-30% GDMT currently limited by low BP Admission likely combination of baseline severe COPD/pneumonia and CHF BNP 713 Troponin peak 1180 ? Demand ischemia with insufficient collaterals. Needs cath mostly to assess patency of LCX She has improved enough to lay flat Ate breakfast will try to proceed with cath this afternoon On board orders written Continue heparin / plavix  Pneumonia:  continue Azithromycin and rocephin repeat CXR to see if reticulonodular pattern improves with Rx CHF On steroids for COPD  Would have pulmonary see in house given severity of her COPD and possible need for home oxygen and component of ILD AICD:  no d/c has had normal function  Thyroid:  continue synthroid replacement    Jenkins Rouge 04/29/2022, 8:50 AM

## 2022-04-29 NOTE — Progress Notes (Signed)
Heart Failure Nurse Navigator Progress Note  PCP: Loraine Leriche., MD PCP-Cardiologist: Angelena Form Admission Diagnosis: Acute respiratory failure with hypoxia, Acute cough, shortness of breath.  Admitted from: Home   Presentation:   [facility] presented with shortness of breath and occasional cough. On admission temperature was 100.5, was tachypnea requiring BiPAP, BNP 713, Troponin 1,180. CXR showed suspicious of pneumonia or interstitial edema with underlying chronic lung disease. Started antibiotics, a echo on 9/27 showed LVEF 25-30% G2DD, a Left heart cath scheduled for 04/29/22. IV lasix given.   Patient and husband educated on the sign and symptoms of heart failure, daily weights, when to call her doctor or go to the ER, Diet/ fluid restrictions, taking all her medications as prescribed and attending her doctor appointments. Both patient and husband stated she watches her sodium intake and is compliant of medications. Patient verbalized her understanding of education and a Hf TOC appointment is scheduled for 10/11/203 @ 9 am.   ECHO/ LVEF: 25-30% HFrEF G2DD  Clinical Course:  Past Medical History:  Diagnosis Date   Abnormal EKG    AICD (automatic cardioverter/defibrillator) present    Alcoholic intoxication without complication (HCC)    Allergy    Cardiomyopathy, ischemic    Cataract    Chest pain 08/29/2016   CHF (congestive heart failure) (HCC)    Chronic systolic heart failure ([place]) 08/29/2016   Facial laceration    Fall 07/07/2016   Hyperlipidemia    Hypertension 03/03/2017   Hyponatremia    Hypothyroidism 07/08/2016   NSTEMI (non-ST elevated myocardial infarction) ([place])    Syncope    Thyroid disease      Social History   Socioeconomic History   Marital status: Married    Spouse name: Comptroller   Number of children: 2   Years of education: Not on file   Highest education level: High school graduate  Occupational History   Occupation: Retired-worked for  post office  Tobacco Use   Smoking status: Former    Packs/day: 1.00    Years: 50.00    Total pack years: 50.00    Types: Cigarettes    Quit date: 07/07/2016    Years since quitting: 5.8   Smokeless tobacco: Never  Vaping Use   Vaping Use: Never used  Substance and Sexual Activity   Alcohol use: Not Currently   Drug use: Never   Sexual activity: Not Currently  Other Topics Concern   Not on file  Social History Narrative   Not on file   Social Determinants of Health   Financial Resource Strain: Low Risk  (04/29/2022)   Overall Financial Resource Strain (CARDIA)    Difficulty of Paying Living Expenses: Not hard at all  Food Insecurity: No Food Insecurity (04/29/2022)   Hunger Vital Sign    Worried About Running Out of Food in the Last Year: Never true    Ran Out of Food in the Last Year: Never true  Transportation Needs: No Transportation Needs (04/29/2022)   PRAPARE - Hydrologist (Medical): No    Lack of Transportation (Non-Medical): No  Physical Activity: Not on file  Stress: Not on file  Social Connections: Not on file   Education Assessment and Provision:  Detailed education and instructions provided on heart failure disease management including the following:  Signs and symptoms of Heart Failure When to call the physician Importance of daily weights Low sodium diet Fluid restriction Medication management Anticipated future follow-up appointments  Patient education given on  each of the above topics.  Patient acknowledges understanding via teach back method and acceptance of all instructions.  Education Materials:  "Living Better With Heart Failure" Booklet, HF zone tool, & Daily Weight Tracker Tool.  Patient has scale at home: yes Patient has pill box at home: yes    High Risk Criteria for Readmission and/or Poor Patient Outcomes: Heart failure hospital admissions (last 6 months): 0  No Show rate: 1 Difficult social situation:  No Demonstrates medication adherence: Yes Primary Language: English Literacy level: Reading, writing, and comprehension  Barriers of Care:   Diet/ fluid restrictions Daily weights   Considerations/Referrals:   Referral made to Heart Failure Pharmacist Stewardship: Yes Referral made to Heart Failure CSW/NCM TOC: No Referral made to Heart & Vascular TOC clinic: Yes, 05/11/2022 @ 9 am.   Items for Follow-up on DC/TOC: Diet/ fluid restrictions Daily weights   Earnestine Leys, BSN, RN Heart Failure Transport planner Only

## 2022-04-29 NOTE — Progress Notes (Signed)
Physical Therapy Treatment Patient Details Name: Pam [name] MRN: [id number] DOB: Apr 21, 1951 Today's Date: 04/29/2022   History of Present Illness Pt is a 71 y.o. female admitted 04/26/22 with cough, SOB. CXR suspicious for atypical PNA vs interstitial edema. Chest CT with advanced epmhysema, trace pleural effusion. Workup for acute hypoxic respiratory failure secondary to PNA, COPD, CHF, concern for NSTEMI. Plan for LHC. PMH includes CAD (s/p PCI), ischemic cardiomyopathy (EF 25-30%), COPD.    PT Comments    Pt received supine and agreeable to session with continued progress. Pt continues to demonstrate ability to complete bed mobility at mod I level and transfers with min guard for safety. Pt with good self pacing throughout gait and good recall of goal for multiple shorter bouts throughout day. Pt needing min guard throughout gait with RW, however needing intermittent min assist as pt with R bias in hall needing assist to walk to left to navigate around people/objects. SpO2 dropping to 88% on 2L and not recovering during activity, increased to 3L with SpO2 rebounding to 98%. Continued to encourage multiple gait bouts throughout day as well as importance of OOB and upright sitting with pt verbalizing understanding. Pt continues to benefit from skilled PT services to progress toward functional mobility goals.    Recommendations for follow up therapy are one component of a multi-disciplinary discharge planning process, led by the attending physician.  Recommendations may be updated based on patient status, additional functional criteria and insurance authorization.  Follow Up Recommendations  Home health PT     Assistance Recommended at Discharge Intermittent Supervision/Assistance  Patient can return home with the following A little help with bathing/dressing/bathroom;Assistance with cooking/housework;Help with stairs or ramp for entrance   Equipment Recommendations  None recommended by PT     Recommendations for Other Services       Precautions / Restrictions Precautions Precautions: Fall;Other (comment) Precaution Comments: Watch SpO2 (does not wear baseline) Restrictions Weight Bearing Restrictions: No     Mobility  Bed Mobility Overal bed mobility: Modified Independent             General bed mobility comments: Mod indep repositioning in bed; agreeable to bed placed in chair position    Transfers Overall transfer level: Needs assistance Equipment used: Rolling walker (2 wheels), None Transfers: Sit to/from Stand, Bed to chair/wheelchair/BSC Sit to Stand: Min guard   Step pivot transfers: Min assist, Min guard (assist for lines/leads)       General transfer comment: pt with no difficulty this session, min guard for safety pt able to stand multiple times from EOB and BSC    Ambulation/Gait Ambulation/Gait assistance: Min guard, Min assist Gait Distance (Feet): 150 Feet Assistive device: Rolling walker (2 wheels) Gait Pattern/deviations: Step-through pattern, Decreased stride length Gait velocity: decr     General Gait Details: slow steady gait with RW, pt with good self pacing and recall of goal for multiple short walks throughout day as opposed to one long walk. pt with tendency for R bias in hall needing increased cues to walk to L to navigate around obstabcles/people   Stairs             Wheelchair Mobility    Modified Rankin (Stroke Patients Only)       Balance Overall balance assessment: Needs assistance   Sitting balance-Leahy Scale: Fair  Cognition Arousal/Alertness: Awake/alert Behavior During Therapy: WFL for tasks assessed/performed Overall Cognitive Status: Within Functional Limits for tasks assessed                                 General Comments: WFL for simple tasks, not formally assessed. Verbose with speech requiring some redirection         Exercises      General Comments General comments (skin integrity, edema, etc.): SpO2 94% on 2L on arrival, SpO2 dropping to 87% during gait and not recoverying, increased to 3L with SpO2 rebouding to 98%, replaced 2L at end of session      Pertinent Vitals/Pain Pain Assessment Pain Assessment: No/denies pain    Home Living                          Prior Function            PT Goals (current goals can now be found in the care plan section) Acute Rehab PT Goals Patient Stated Goal: return home, willing to consider HHPT PT Goal Formulation: With patient Time For Goal Achievement: 05/12/22    Frequency    Min 3X/week      PT Plan      Co-evaluation              AM-PAC PT "6 Clicks" Mobility   Outcome Measure  Help needed turning from your back to your side while in a flat bed without using bedrails?: None Help needed moving from lying on your back to sitting on the side of a flat bed without using bedrails?: A Little Help needed moving to and from a bed to a chair (including a wheelchair)?: A Little Help needed standing up from a chair using your arms (e.g., wheelchair or bedside chair)?: A Little Help needed to walk in hospital room?: A Little Help needed climbing 3-5 steps with a railing? : A Little 6 Click Score: 19    End of Session Equipment Utilized During Treatment: Oxygen Activity Tolerance: Patient tolerated treatment well Patient left: in bed;with call bell/phone within reach Nurse Communication: Mobility status PT Visit Diagnosis: Other abnormalities of gait and mobility (R26.89);Muscle weakness (generalized) (M62.81)     Time: 9326-7124 PT Time Calculation (min) (ACUTE ONLY): 24 min  Charges:  $Gait Training: 8-22 mins $Therapeutic Activity: 8-22 mins                     Noralyn Karim R. PTA Acute Rehabilitation Services Office: [place] 04/29/2022, 9:57 AM

## 2022-04-29 NOTE — H&P (View-Only) (Signed)
Primary Cardiologist:  McAlhany/Camnitz  Subjective:  She looks much better Not tachypnic has ambulated with sats ok She ate breakfast   Objective:  Vitals:   04/28/22 1930 04/28/22 2332 04/29/22 0320 04/29/22 0805  BP: (!) 120/58 (!) 116/52 131/64 (!) 111/52  Pulse: 75 70 91 78  Resp: '15 16 16 '$ (!) 21  Temp: 98 F (36.7 C) 98.1 F (36.7 C) 97.9 F (36.6 C) 98 F (36.7 C)  TempSrc: Oral Oral Oral Oral  SpO2: 95% 99% 96% 92%  Weight:      Height:        Intake/Output from previous day:  Intake/Output Summary (Last 24 hours) at 04/29/2022 0850 Last data filed at 04/29/2022 0324 Gross per 24 hour  Intake 920.85 ml  Output 1300 ml  Net -379.15 ml    Physical Exam: Chronically ill thin white female Lungs no wheezing this am  No murmur AICD under left clavicle  Abdomen benign No edema  Lab Results: Basic Metabolic Panel: Recent Labs    04/26/22 1555 04/27/22 0252 04/27/22 2359 04/29/22 0005  NA 126*   < > 131* 130*  K 4.7   < > 3.6 4.7  CL 90*   < > 98 97*  CO2 26   < > 22 24  GLUCOSE 138*   < > 114* 135*  BUN 13   < > 15 23  CREATININE 0.96   < > 0.90 0.87  CALCIUM 9.0   < > 8.5* 8.7*  MG 1.8  --   --   --    < > = values in this interval not displayed.   Liver Function Tests: Recent Labs    04/26/22 1555  AST 36  ALT 44  ALKPHOS 129*  BILITOT 0.6  PROT 6.9  ALBUMIN 3.3*   Recent Labs    04/26/22 1555  LIPASE 33   CBC: Recent Labs    04/26/22 1555 04/27/22 0252 04/27/22 2359 04/29/22 0005  WBC 13.2*   < > 14.2* 17.2*  NEUTROABS 9.1*  --   --   --   HGB 12.6   < > 10.6* 9.8*  HCT 35.1*   < > 28.8* 27.5*  MCV 98.0   < > 96.0 97.9  PLT 469*   < > 394 451*   < > = values in this interval not displayed.     Imaging: ECHOCARDIOGRAM COMPLETE  Result Date: 04/27/2022    ECHOCARDIOGRAM REPORT   Patient Name:   Pam [name] Date of Exam: 04/27/2022 Medical Rec #:  [id number]   Height:       63.0 in Accession #:    [id number]  Weight:        119.9 lb Date of Birth:  12/14/1950  BSA:          1.556 m Patient Age:    71 years    BP:           118/56 mmHg Patient Gender: F           HR:           79 bpm. Exam Location:  Inpatient Procedure: 2D Echo, Cardiac Doppler, Color Doppler and Intracardiac            Opacification Agent Indications:    CHF  History:        Patient has prior history of Echocardiogram examinations, most                 recent 06/19/2019.  CHF and Cardiomyopathy, CAD, Abnormal ECG,                 Signs/Symptoms:Chest Pain; Risk Factors:Hypertension and                 Dyslipidemia.  Sonographer:    Greer Pickerel Referring Phys: [id number] [place] T [facility]  Sonographer Comments: Technically challenging study due to limited acoustic windows, suboptimal parasternal window and no subcostal window. Image acquisition challenging due to patient body habitus and Image acquisition challenging due to respiratory motion. IMPRESSIONS  1. Left ventricular ejection fraction, by estimation, is 25 to 30%. The left ventricle has severely decreased function. The left ventricle demonstrates regional wall motion abnormalities (see scoring diagram/findings for description). Left ventricular diastolic parameters are consistent with Grade II diastolic dysfunction (pseudonormalization). Elevated left ventricular end-diastolic pressure.  2. Right ventricular systolic function is normal. The right ventricular size is normal. There is mildly elevated pulmonary artery systolic pressure.  3. Left atrial size was moderately dilated.  4. The mitral valve is normal in structure. Trivial mitral valve regurgitation. No evidence of mitral stenosis.  5. The aortic valve is normal in structure. Aortic valve regurgitation is not visualized. No aortic stenosis is present.  6. The inferior vena cava is normal in size with greater than 50% respiratory variability, suggesting right atrial pressure of 3 mmHg. FINDINGS  Left Ventricle: Left ventricular ejection fraction, by estimation,  is 25 to 30%. The left ventricle has severely decreased function. The left ventricle demonstrates regional wall motion abnormalities. Definity contrast agent was given IV to delineate the left ventricular endocardial borders. The left ventricular internal cavity size was normal in size. There is no left ventricular hypertrophy. Left ventricular diastolic parameters are consistent with Grade II diastolic dysfunction (pseudonormalization). Elevated left ventricular end-diastolic pressure.  LV Wall Scoring: The apical inferior segment and apex are akinetic. The mid and distal anterior wall, mid and distal lateral wall, mid and distal anterior septum, inferior wall, and mid inferoseptal segment are hypokinetic. The antero-lateral wall, basal anteroseptal segment, basal inferolateral segment, basal anterior segment, and basal inferoseptal segment are normal. Right Ventricle: The right ventricular size is normal. No increase in right ventricular wall thickness. Right ventricular systolic function is normal. There is mildly elevated pulmonary artery systolic pressure. The tricuspid regurgitant velocity is 2.88  m/s, and with an assumed right atrial pressure of 8 mmHg, the estimated right ventricular systolic pressure is 25.4 mmHg. Left Atrium: Left atrial size was moderately dilated. Right Atrium: Right atrial size was normal in size. Pericardium: There is no evidence of pericardial effusion. Mitral Valve: The mitral valve is normal in structure. Trivial mitral valve regurgitation. No evidence of mitral valve stenosis. Tricuspid Valve: The tricuspid valve is normal in structure. Tricuspid valve regurgitation is mild . No evidence of tricuspid stenosis. Aortic Valve: The aortic valve is normal in structure. Aortic valve regurgitation is not visualized. No aortic stenosis is present. Pulmonic Valve: The pulmonic valve was normal in structure. Pulmonic valve regurgitation is not visualized. No evidence of pulmonic stenosis.  Aorta: The aortic root is normal in size and structure. Venous: The inferior vena cava is normal in size with greater than 50% respiratory variability, suggesting right atrial pressure of 3 mmHg. IAS/Shunts: No atrial level shunt detected by color flow Doppler. Additional Comments: A device lead is visualized.  LEFT VENTRICLE PLAX 2D LVOT diam:     0.85 cm     Diastology LV SV:  10          LV e' medial:    5.10 cm/s LV SV Index:   7           LV E/e' medial:  17.3 LVOT Area:     0.57 cm    LV e' lateral:   4.95 cm/s                            LV E/e' lateral: 17.8  LV Volumes (MOD) LV vol d, MOD A2C: 79.9 ml LV vol d, MOD A4C: 77.5 ml LV vol s, MOD A2C: 49.6 ml LV vol s, MOD A4C: 60.7 ml LV SV MOD A2C:     30.3 ml LV SV MOD A4C:     77.5 ml LV SV MOD BP:      24.0 ml RIGHT VENTRICLE RV S prime:     14.20 cm/s TAPSE (M-mode): 1.5 cm LEFT ATRIUM           Index        RIGHT ATRIUM           Index LA Vol (A4C): 55.4 ml 35.61 ml/m  RA Area:     12.70 cm                                    RA Volume:   30.10 ml  19.35 ml/m  AORTIC VALVE LVOT Vmax:   108.00 cm/s LVOT Vmean:  66.100 cm/s LVOT VTI:    0.185 m  AORTA Ao Root diam: 2.30 cm MITRAL VALVE               TRICUSPID VALVE MV Area (PHT): 3.72 cm    TR Peak grad:   33.2 mmHg MV Decel Time: 204 msec    TR Vmax:        288.00 cm/s MV E velocity: 88.30 cm/s MV A velocity: 81.80 cm/s  SHUNTS MV E/A ratio:  1.08        Systemic VTI:  0.18 m                            Systemic Diam: 0.85 cm Skeet Latch MD Electronically signed by Skeet Latch MD Signature Date/Time: 04/27/2022/2:44:38 PM    Final     Cardiac Studies:  ECG: SR non specific ST changes    Telemetry:  NSR   Echo: EF 25-30% normal RV no significant valve dx  Medications:    atorvastatin  80 mg Oral Daily   clopidogrel  75 mg Oral Daily   empagliflozin  10 mg Oral Daily   guaiFENesin  600 mg Oral BID   levothyroxine  37.5 mcg Oral QAC breakfast   methylPREDNISolone (SOLU-MEDROL)  injection  40 mg Intravenous Q12H   pneumococcal 20-valent conjugate vaccine  0.5 mL Intramuscular Tomorrow-1000   ranolazine  1,000 mg Oral BID   sodium chloride flush  3 mL Intravenous Q12H   valACYclovir  1,000 mg Oral Daily      azithromycin 500 mg (04/28/22 1954)   cefTRIAXone (ROCEPHIN)  IV 1 g (04/28/22 1807)   heparin 900 Units/hr (04/29/22 0324)    Assessment/Plan:  Pam [name] is a 71 y.o. female with a hx of CAD, ICM, HFrEFs/p [place]-ICD, HTN, HLD, and hypothyroidism who is being seen 04/26/2022 for the evaluation of heart failure  and troponin elevation at the request of the ED.  Ischemic DCM:  known CTO LAD by cath 2017 no viability anterior wall 1/18 had orbital atherectomy  And stenting of LCX TTE shows same EF as TTE 2020 25-30% GDMT currently limited by low BP Admission likely combination of baseline severe COPD/pneumonia and CHF BNP 713 Troponin peak 1180 ? Demand ischemia with insufficient collaterals. Needs cath mostly to assess patency of LCX She has improved enough to lay flat Ate breakfast will try to proceed with cath this afternoon On board orders written Continue heparin / plavix  Pneumonia:  continue Azithromycin and rocephin repeat CXR to see if reticulonodular pattern improves with Rx CHF On steroids for COPD  Would have pulmonary see in house given severity of her COPD and possible need for home oxygen and component of ILD AICD:  no d/c has had normal function  Thyroid:  continue synthroid replacement    Jenkins Rouge 04/29/2022, 8:50 AM

## 2022-04-29 NOTE — Care Management Important Message (Signed)
Important Message  Patient Details  Name: Pam [name] MRN: [id number] Date of Birth: March 30, 1951   Medicare Important Message Given:  Yes     Lc Joynt 04/29/2022, 2:30 PM

## 2022-04-30 DIAGNOSIS — J9601 Acute respiratory failure with hypoxia: Secondary | ICD-10-CM | POA: Diagnosis not present

## 2022-04-30 DIAGNOSIS — I5020 Unspecified systolic (congestive) heart failure: Secondary | ICD-10-CM | POA: Diagnosis not present

## 2022-04-30 LAB — CBC
HCT: 24.3 % — ABNORMAL LOW (ref 36.0–46.0)
Hemoglobin: 8.9 g/dL — ABNORMAL LOW (ref 12.0–15.0)
MCH: 35.6 pg — ABNORMAL HIGH (ref 26.0–34.0)
MCHC: 36.6 g/dL — ABNORMAL HIGH (ref 30.0–36.0)
MCV: 97.2 fL (ref 80.0–100.0)
Platelets: 413 10*3/uL — ABNORMAL HIGH (ref 150–400)
RBC: 2.5 MIL/uL — ABNORMAL LOW (ref 3.87–5.11)
RDW: 14.3 % (ref 11.5–15.5)
WBC: 12.2 10*3/uL — ABNORMAL HIGH (ref 4.0–10.5)
nRBC: 0 % (ref 0.0–0.2)

## 2022-04-30 LAB — BASIC METABOLIC PANEL
Anion gap: 9 (ref 5–15)
BUN: 17 mg/dL (ref 8–23)
CO2: 25 mmol/L (ref 22–32)
Calcium: 8.7 mg/dL — ABNORMAL LOW (ref 8.9–10.3)
Chloride: 98 mmol/L (ref 98–111)
Creatinine, Ser: 0.79 mg/dL (ref 0.44–1.00)
GFR, Estimated: >60 mL/min (ref >60)
Glucose, Bld: 137 mg/dL — ABNORMAL HIGH (ref 70–99)
Potassium: 4.3 mmol/L (ref 3.5–5.1)
Sodium: 132 mmol/L — ABNORMAL LOW (ref 135–145)

## 2022-04-30 MED ORDER — FUROSEMIDE 20 MG PO TABS
20.0000 mg | ORAL_TABLET | Freq: Every day | ORAL | Status: DC
  Administered 2022-04-30 – 2022-05-02 (×3): 20 mg via ORAL
  Filled 2022-04-30 (×3): qty 1

## 2022-04-30 MED ORDER — METHYLPREDNISOLONE SODIUM SUCC 40 MG IJ SOLR
40.0000 mg | Freq: Two times a day (BID) | INTRAMUSCULAR | Status: AC
  Administered 2022-04-30: 40 mg via INTRAVENOUS
  Filled 2022-04-30: qty 1

## 2022-04-30 MED ORDER — LOSARTAN POTASSIUM 25 MG PO TABS
12.5000 mg | ORAL_TABLET | Freq: Every day | ORAL | Status: DC
  Administered 2022-04-30 – 2022-05-02 (×3): 12.5 mg via ORAL
  Filled 2022-04-30 (×3): qty 1

## 2022-04-30 MED ORDER — CEFDINIR 300 MG PO CAPS
300.0000 mg | ORAL_CAPSULE | Freq: Two times a day (BID) | ORAL | Status: DC
  Administered 2022-04-30 – 2022-05-02 (×5): 300 mg via ORAL
  Filled 2022-04-30 (×5): qty 1

## 2022-04-30 MED ORDER — PREDNISONE 20 MG PO TABS
40.0000 mg | ORAL_TABLET | Freq: Every day | ORAL | Status: DC
  Administered 2022-05-01 – 2022-05-02 (×2): 40 mg via ORAL
  Filled 2022-04-30 (×2): qty 2

## 2022-04-30 MED ORDER — BISOPROLOL FUMARATE 5 MG PO TABS
5.0000 mg | ORAL_TABLET | Freq: Every day | ORAL | Status: DC
  Administered 2022-04-30 – 2022-05-02 (×3): 5 mg via ORAL
  Filled 2022-04-30 (×3): qty 1

## 2022-04-30 NOTE — Progress Notes (Signed)
Mobility Specialist Progress Note:   04/30/22 1430  Mobility  Activity Ambulated with assistance in hallway  Level of Assistance Standby assist, set-up cues, supervision of patient - no hands on  Assistive Device Front wheel walker  Distance Ambulated (ft) 140 ft  Activity Response Tolerated well  $Mobility charge 1 Mobility   Pre-mobility: 90% SpO2 on RA During mobility: 89% SpO2 on RA Post-mobility: 90% SpO2 on RA  Pt received in bed and agreeable. No complaints. Pt left in bed with all needs met and call bell in reach.   Zackari Ruane Mobility Specialist-Acute Rehab Secure Chat only

## 2022-04-30 NOTE — Plan of Care (Signed)

## 2022-04-30 NOTE — Progress Notes (Signed)
Pt has PRN BIPAP orders, no distress noted at this time.  °

## 2022-04-30 NOTE — Procedures (Signed)
Progress Note  Patient Name: Pam [name] Date of Encounter: 04/30/2022  Primary Cardiologist: Lauree Chandler, MD  Subjective   No chest pain.  Cough improving.  No palpitations.  Inpatient Medications    Scheduled Meds:  atorvastatin  80 mg Oral Daily   cefdinir  300 mg Oral Q12H   clopidogrel  75 mg Oral Daily   empagliflozin  10 mg Oral Daily   furosemide  20 mg Oral Daily   guaiFENesin  600 mg Oral BID   heparin  5,000 Units Subcutaneous Q8H   levothyroxine  37.5 mcg Oral QAC breakfast   methylPREDNISolone (SOLU-MEDROL) injection  40 mg Intravenous Q12H   pneumococcal 20-valent conjugate vaccine  0.5 mL Intramuscular Tomorrow-1000   [START ON 05/01/2022] predniSONE  40 mg Oral Q breakfast   ranolazine  1,000 mg Oral BID   sodium chloride flush  3 mL Intravenous Q12H   sodium chloride flush  3 mL Intravenous Q12H   valACYclovir  1,000 mg Oral Daily   Continuous Infusions:  sodium chloride 250 mL (04/29/22 2023)   PRN Meds: sodium chloride, acetaminophen **OR** acetaminophen, acetaminophen, ipratropium-albuterol, ondansetron (ZOFRAN) IV, mouth rinse, sodium chloride flush   Vital Signs    Vitals:   04/30/22 0724 04/30/22 0805 04/30/22 0825 04/30/22 1053  BP:    (!) 137/54  Pulse: 85 80 72 71  Resp: 17 15 (!) 21 18  Temp:    98 F (36.7 C)  TempSrc:    Oral  SpO2: 94% 90% 92% 92%  Weight:      Height:        Intake/Output Summary (Last 24 hours) at 04/30/2022 1126 Last data filed at 04/30/2022 0700 Gross per 24 hour  Intake 2064.86 ml  Output 1750 ml  Net 314.86 ml   Filed Weights   04/26/22 1501 04/27/22 0800 04/29/22 1303  Weight: 54.4 kg 54.4 kg 53.6 kg    Telemetry    Sinus rhythm. Personally reviewed.  ECG    An ECG dated 04/29/2022 was personally reviewed today and demonstrated:  Sinus rhythm with poor R wave progression.  Physical Exam   GEN: No acute distress.   Neck: No JVD. Cardiac: RRR, no murmur, rub, or gallop.   Respiratory: Nonlabored. Clear to auscultation bilaterally. GI: Soft, nontender, bowel sounds present. MS: No edema; No deformity. Neuro:  Nonfocal. Psych: Alert and oriented x 3. Normal affect.  Labs    Chemistry Recent Labs  Lab 04/26/22 1555 04/27/22 0252 04/27/22 2359 04/29/22 0005 04/30/22 0035  NA 126*   < > 131* 130* 132*  K 4.7   < > 3.6 4.7 4.3  CL 90*   < > 98 97* 98  CO2 26   < > '22 24 25  '$ GLUCOSE 138*   < > 114* 135* 137*  BUN 13   < > '15 23 17  '$ CREATININE 0.96   < > 0.90 0.87 0.79  CALCIUM 9.0   < > 8.5* 8.7* 8.7*  PROT 6.9  --   --   --   --   ALBUMIN 3.3*  --   --   --   --   AST 36  --   --   --   --   ALT 44  --   --   --   --   ALKPHOS 129*  --   --   --   --   BILITOT 0.6  --   --   --   --  GFRNONAA >60   < > >60 >60 >60  ANIONGAP 10   < > '11 9 9   '$ < > = values in this interval not displayed.     Hematology Recent Labs  Lab 04/27/22 2359 04/29/22 0005 04/30/22 0035  WBC 14.2* 17.2* 12.2*  RBC 3.00* 2.81* 2.50*  HGB 10.6* 9.8* 8.9*  HCT 28.8* 27.5* 24.3*  MCV 96.0 97.9 97.2  MCH 35.3* 34.9* 35.6*  MCHC 36.8* 35.6 36.6*  RDW 13.9 14.1 14.3  PLT 394 451* 413*    Cardiac Enzymes Recent Labs  Lab 04/26/22 1555 04/26/22 1904 04/27/22 0252  TROPONINIHS 184* 1,180* 938*    BNP Recent Labs  Lab 04/26/22 1556  BNP 713.3*     Radiology    CARDIAC CATHETERIZATION  Result Date: 04/29/2022   Prox LAD to Mid LAD lesion is 90% stenosed with 95% stenosed side branch in 1st Sept. Mid LAD to Dist LAD lesion is 100% stenosed.   Previously placed Prox Cx to Dist Cx stent of unknown type is  widely patent with 0% stenosed side branch in 1st Mrg.   Lat 1st Mrg lesion is 50% stenosed.   LV end diastolic pressure is moderately elevated.   There is no aortic valve stenosis. POST CATH DIAGNOSES Stable coronary disease with widely patent proximal to mid LCx stent.  At most 50% ostial OM1. Known occlusion of the LAD after SP1.  The ostium itself has a  roughly 90% stenosis which could be a source of ischemia.  There is a small diagonal branch comes off at the same location. Mild diffuse disease in the RCA. Moderately elevated EDP of 18 mmHg. Suspect troponin elevation was related to demand ischemia with known existing disease and insult to the heart via CHF exacerbation versus pneumonia. RECOMMENDATIONS Return to nursing unit for ongoing care. She appears to be borderline euvolemic Glenetta Hew, MD   Cardiac Studies   Echocardiogram 04/27/2022:  1. Left ventricular ejection fraction, by estimation, is 25 to 30%. The  left ventricle has severely decreased function. The left ventricle  demonstrates regional wall motion abnormalities (see scoring  diagram/findings for description). Left ventricular  diastolic parameters are consistent with Grade II diastolic dysfunction  (pseudonormalization). Elevated left ventricular end-diastolic pressure.   2. Right ventricular systolic function is normal. The right ventricular  size is normal. There is mildly elevated pulmonary artery systolic  pressure.   3. Left atrial size was moderately dilated.   4. The mitral valve is normal in structure. Trivial mitral valve  regurgitation. No evidence of mitral stenosis.   5. The aortic valve is normal in structure. Aortic valve regurgitation is  not visualized. No aortic stenosis is present.   6. The inferior vena cava is normal in size with greater than 50%  respiratory variability, suggesting right atrial pressure of 3 mmHg.   Assessment & Plan    1.  HFrEF with ischemic cardiomyopathy, LVEF 25 to 30%, moderate diastolic dysfunction and normal RV contraction.  Currently on Jardiance and Lasix.  2.  Elevated high-sensitivity troponin I most consistent with demand ischemia.  Cardiac catheterization on 9/29 showed stable coronary anatomy with patent circumflex stent sites, known occlusion of the LAD with 90% ostial stenosis and a small second diagonal branch in  similar location, otherwise mildly diseased RCA and moderately elevated LVEDP of 18 mmHg.  No clear culprit for revascularization.  Continues on Plavix, Lipitor, and Ranexa.  3.  Community-acquired pneumonia in the setting of COPD and acute hypoxic  respiratory failure.  Per primary team.  Continue Jardiance.  Instead of starting back on Coreg will initiate bisoprolol given COPD and presentation with wheezing.  Also resume Cozaar at lower dose.  Eventually would add Aldactone and then determine what her outpatient standing Lasix dose will be.  Check BMET in AM.  Signed, Rozann Lesches, MD  04/30/2022, 11:26 AM

## 2022-04-30 NOTE — Progress Notes (Signed)
PROGRESS NOTE    Pam [name]  MEQ:[id number] DOB: [birth year] DOA: 04/26/2022 PCP: Loraine Leriche., MD  70/F with history of CAD, PCI and stents, ischemic cardiomyopathy, EF 25-30%, COPD, hypothyroidism presented to the ED with cough, shortness of breath. -In the ER temp was 100.5, tachypneic placed on BiPAP -Chest x-ray noted diffuse reticulonodular infiltrates suspicious for atypical pneumonia versus interstitial edema -CT chest noted advanced emphysema, interlobar septal thickening, right middle lobe infiltrate and trace pleural effusion -Troponin was 184, trended up to 1180 -Improving on antibiotics steroids and IV Lasix -Cardiac cath showed widely patent stent and no other disease besides the known occluded LAD.  Quite possible she could have demand ischemia with the extent of disease and residual LAD/SP1 segment.    Subjective: -Feels better, breathing is improving  Assessment and Plan:  Acute respiratory failure with hypoxia (HCC) -multifactorial, secondary to pneumonia/COPD and CHF -Weaned off BiPAP,  -Change to oral antibiotics, prednisone taper -Wean off O2, ambulate, transfer to telemetry  NSTEMI - known 2-vessel CAD including heavily calcified chronic total occlusion of the proximal/mid LAD, calcified 80% stenosis of the proximal Lcx/OM1, medical management recommended then -Troponin  trended up to 1180, now off IV heparin -Cards following, Cardiac cath showed widely patent stent and no other disease besides the known occluded LAD.   -Continue Ranexa, Plavix, statin, add Coreg -Echo with EF 25% with regional wall motion abnormalities  Acute on chronic systolic CHF -Nonischemic cardiomyopathy, EF 25 to 30% and prior PCI, stents -Repeat echo as noted above, EF 25% with regional wall motion abnormality -Diuresed with IV Lasix, continue Jardiance -allergic to Aldactone -Resume Coreg, p.o. Lasix -Consider low-dose ARB at discharge  Community-acquired  pneumonia Sepsis, POA COPD exacerbation -Ceftriaxone, azithromycin, changed to p.o. -IV steroids, DuoNebs, cut down steroids -Blood cultures are negative  Hyponatremia -Likely secondary to CHF and possibly SIADH, monitor  Hypothyroidism Continue home levothyroxine   DVT prophylaxis: Heparin subcutaneous Code Status: Full code Family Communication: Discussed with patient detail, no family at bedside Disposition Plan: Home  1-to 2 days  Consultants: Cardiology   Procedures:   Antimicrobials:    Objective: Vitals:   04/30/22 0722 04/30/22 0724 04/30/22 0805 04/30/22 0825  BP: 134/76     Pulse: 91 85 80 72  Resp: '17 17 15 '$ (!) 21  Temp: 97.8 F (36.6 C)     TempSrc: Oral     SpO2: 93% 94% 90% 92%  Weight:      Height:        Intake/Output Summary (Last 24 hours) at 04/30/2022 0923 Last data filed at 04/30/2022 0700 Gross per 24 hour  Intake 2064.86 ml  Output 1750 ml  Net 314.86 ml   Filed Weights   04/26/22 1501 04/27/22 0800 04/29/22 1303  Weight: 54.4 kg 54.4 kg 53.6 kg    Examination:  Pleasant chronically ill female sitting up in bed, AAOx3, no distress HEENT: No JVD CVS: S1-S2, regular rhythm Lungs: Improving air movement bilaterally, few basilar rales Abdomen: Soft, nontender, bowel sounds present Extremities: No edema  Skin: No rashes Psychiatry: Anxious    Data Reviewed:   CBC: Recent Labs  Lab 04/26/22 1555 04/27/22 0252 04/27/22 0303 04/27/22 2359 04/29/22 0005 04/30/22 0035  WBC 13.2* 11.0*  --  14.2* 17.2* 12.2*  NEUTROABS 9.1*  --   --   --   --   --   HGB 12.6 11.6* 11.6* 10.6* 9.8* 8.9*  HCT 35.1* 34.3* 34.0* 28.8* 27.5* 24.3*  MCV 98.0 104.9*  --  96.0 97.9 97.2  PLT 469* 296  --  394 451* 277*   Basic Metabolic Panel: Recent Labs  Lab 04/26/22 1555 04/27/22 0252 04/27/22 0303 04/27/22 2359 04/29/22 0005 04/30/22 0035  NA 126* 131* 129* 131* 130* 132*  K 4.7 4.4 3.8 3.6 4.7 4.3  CL 90* 96*  --  98 97* 98  CO2 26  18*  --  '22 24 25  '$ GLUCOSE 138* 93  --  114* 135* 137*  BUN 13 14  --  '15 23 17  '$ CREATININE 0.96 0.91  --  0.90 0.87 0.79  CALCIUM 9.0 8.9  --  8.5* 8.7* 8.7*  MG 1.8  --   --   --   --   --    GFR: Estimated Creatinine Clearance: 54.1 mL/min (by C-G formula based on SCr of 0.79 mg/dL). Liver Function Tests: Recent Labs  Lab 04/26/22 1555  AST 36  ALT 44  ALKPHOS 129*  BILITOT 0.6  PROT 6.9  ALBUMIN 3.3*   Recent Labs  Lab 04/26/22 1555  LIPASE 33   No results for input(s): "AMMONIA" in the last 168 hours. Coagulation Profile: No results for input(s): "INR", "PROTIME" in the last 168 hours. Cardiac Enzymes: No results for input(s): "CKTOTAL", "CKMB", "CKMBINDEX", "TROPONINI" in the last 168 hours. BNP (last 3 results) No results for input(s): "PROBNP" in the last 8760 hours. HbA1C: No results for input(s): "HGBA1C" in the last 72 hours. CBG: No results for input(s): "GLUCAP" in the last 168 hours. Lipid Profile: No results for input(s): "CHOL", "HDL", "LDLCALC", "TRIG", "CHOLHDL", "LDLDIRECT" in the last 72 hours. Thyroid Function Tests: No results for input(s): "TSH", "T4TOTAL", "FREET4", "T3FREE", "THYROIDAB" in the last 72 hours. Anemia Panel: No results for input(s): "VITAMINB12", "FOLATE", "FERRITIN", "TIBC", "IRON", "RETICCTPCT" in the last 72 hours. Urine analysis: No results found for: "COLORURINE", "APPEARANCEUR", "LABSPEC", "PHURINE", "GLUCOSEU", "HGBUR", "BILIRUBINUR", "KETONESUR", "PROTEINUR", "UROBILINOGEN", "NITRITE", "LEUKOCYTESUR" Sepsis Labs: '@LABRCNTIP'$ (procalcitonin:4,lacticidven:4)  ) Recent Results (from the past 240 hour(s))  SARS Coronavirus 2 by RT PCR (hospital order, performed in [facility] hospital lab) *cepheid single result test* Anterior Nasal Swab     Status: None   Collection Time: 04/26/22  3:18 PM   Specimen: Anterior Nasal Swab  Result Value Ref Range Status   SARS Coronavirus 2 by RT PCR NEGATIVE NEGATIVE Final    Comment:  (NOTE) SARS-CoV-2 target nucleic acids are NOT DETECTED.  The SARS-CoV-2 RNA is generally detectable in upper and lower respiratory specimens during the acute phase of infection. The lowest concentration of SARS-CoV-2 viral copies this assay can detect is 250 copies / mL. A negative result does not preclude SARS-CoV-2 infection and should not be used as the sole basis for treatment or other patient management decisions.  A negative result may occur with improper specimen collection / handling, submission of specimen other than nasopharyngeal swab, presence of viral mutation(s) within the areas targeted by this assay, and inadequate number of viral copies (<250 copies / mL). A negative result must be combined with clinical observations, patient history, and epidemiological information.  Fact Sheet for Patients:   https://www.patel.info/  Fact Sheet for Healthcare Providers: https://hall.com/  This test is not yet approved or  cleared by the [place] FDA and has been authorized for detection and/or diagnosis of SARS-CoV-2 by FDA under an Emergency Use Authorization (EUA).  This EUA will remain in effect (meaning this test can be used) for the duration of the COVID-19 declaration under Section 564(b)(1) of the Act,  21 U.S.C. section 360bbb-3(b)(1), unless the authorization is terminated or revoked sooner.  Performed at [facility], [place] [address] [place], [place] [address]   Blood culture (routine x 2)     Status: None (Preliminary result)   Collection Time: 04/26/22  3:21 PM   Specimen: BLOOD  Result Value Ref Range Status   Specimen Description BLOOD RIGHT ANTECUBITAL  Final   Special Requests   Final    BOTTLES DRAWN AEROBIC AND ANAEROBIC Blood Culture adequate volume   Culture   Final    NO GROWTH 3 DAYS Performed at [facility], [place] [address], [place], [place] [address]    Report Status PENDING  Incomplete   Blood culture (routine x 2)     Status: None (Preliminary result)   Collection Time: 04/26/22  3:26 PM   Specimen: BLOOD  Result Value Ref Range Status   Specimen Description BLOOD LEFT ANTECUBITAL  Final   Special Requests   Final    BOTTLES DRAWN AEROBIC AND ANAEROBIC Blood Culture results may not be optimal due to an excessive volume of blood received in culture bottles   Culture   Final    NO GROWTH 3 DAYS Performed at [facility], [place] [address] [place], [place] [address]    Report Status PENDING  Incomplete  MRSA Next Gen by PCR, Nasal     Status: None   Collection Time: 04/27/22  7:08 PM   Specimen: Nasal Mucosa; Nasal Swab  Result Value Ref Range Status   MRSA by PCR Next Gen NOT DETECTED NOT DETECTED Final    Comment: (NOTE) The GeneXpert MRSA Assay (FDA approved for NASAL specimens only), is one component of a comprehensive MRSA colonization surveillance program. It is not intended to diagnose MRSA infection nor to guide or monitor treatment for MRSA infections. Test performance is not FDA approved in patients less than 11 years old. Performed at [facility], [place] [address], [place], [place] [address]      Radiology Studies: CARDIAC CATHETERIZATION  Result Date: 04/29/2022   Prox LAD to Mid LAD lesion is 90% stenosed with 95% stenosed side branch in 1st Sept. Mid LAD to Dist LAD lesion is 100% stenosed.   Previously placed Prox Cx to Dist Cx stent of unknown type is  widely patent with 0% stenosed side branch in 1st Mrg.   Lat 1st Mrg lesion is 50% stenosed.   LV end diastolic pressure is moderately elevated.   There is no aortic valve stenosis. POST CATH DIAGNOSES Stable coronary disease with widely patent proximal to mid LCx stent.  At most 50% ostial OM1. Known occlusion of the LAD after SP1.  The ostium itself has a roughly 90% stenosis which could be a source of ischemia.  There is a small diagonal branch comes off at the same location. Mild diffuse  disease in the RCA. Moderately elevated EDP of 18 mmHg. Suspect troponin elevation was related to demand ischemia with known existing disease and insult to the heart via CHF exacerbation versus pneumonia. RECOMMENDATIONS Return to nursing unit for ongoing care. She appears to be borderline euvolemic Glenetta Hew, MD    Scheduled Meds:  atorvastatin  80 mg Oral Daily   clopidogrel  75 mg Oral Daily   empagliflozin  10 mg Oral Daily   guaiFENesin  600 mg Oral BID   heparin  5,000 Units Subcutaneous Q8H   levothyroxine  37.5 mcg Oral QAC breakfast   methylPREDNISolone (  SOLU-MEDROL) injection  40 mg Intravenous Q12H   pneumococcal 20-valent conjugate vaccine  0.5 mL Intramuscular Tomorrow-1000   ranolazine  1,000 mg Oral BID   sodium chloride flush  3 mL Intravenous Q12H   sodium chloride flush  3 mL Intravenous Q12H   valACYclovir  1,000 mg Oral Daily   Continuous Infusions:  sodium chloride 250 mL (04/29/22 2023)   azithromycin 500 mg (04/29/22 1705)   cefTRIAXone (ROCEPHIN)  IV 1 g (04/29/22 1608)     LOS: 4 days    Time spent: 56mn    PDomenic Polite MD [facility]   04/30/2022, 9:23 AM

## 2022-04-30 NOTE — Progress Notes (Signed)
Pt refused bipap for tonight 

## 2022-05-01 DIAGNOSIS — J9601 Acute respiratory failure with hypoxia: Secondary | ICD-10-CM | POA: Diagnosis not present

## 2022-05-01 DIAGNOSIS — I5020 Unspecified systolic (congestive) heart failure: Secondary | ICD-10-CM

## 2022-05-01 LAB — CULTURE, BLOOD (ROUTINE X 2)
Culture: NO GROWTH
Culture: NO GROWTH
Special Requests: ADEQUATE

## 2022-05-01 LAB — BASIC METABOLIC PANEL
Anion gap: 11 (ref 5–15)
BUN: 15 mg/dL (ref 8–23)
CO2: 22 mmol/L (ref 22–32)
Calcium: 9.2 mg/dL (ref 8.9–10.3)
Chloride: 97 mmol/L — ABNORMAL LOW (ref 98–111)
Creatinine, Ser: 0.93 mg/dL (ref 0.44–1.00)
GFR, Estimated: >60 mL/min (ref >60)
Glucose, Bld: 129 mg/dL — ABNORMAL HIGH (ref 70–99)
Potassium: 4 mmol/L (ref 3.5–5.1)
Sodium: 130 mmol/L — ABNORMAL LOW (ref 135–145)

## 2022-05-01 LAB — CBC
HCT: 25.5 % — ABNORMAL LOW (ref 36.0–46.0)
Hemoglobin: 9 g/dL — ABNORMAL LOW (ref 12.0–15.0)
MCH: 35.2 pg — ABNORMAL HIGH (ref 26.0–34.0)
MCHC: 35.3 g/dL (ref 30.0–36.0)
MCV: 99.6 fL (ref 80.0–100.0)
Platelets: 454 10*3/uL — ABNORMAL HIGH (ref 150–400)
RBC: 2.56 MIL/uL — ABNORMAL LOW (ref 3.87–5.11)
RDW: 14.3 % (ref 11.5–15.5)
WBC: 16.7 10*3/uL — ABNORMAL HIGH (ref 4.0–10.5)
nRBC: 0.1 % (ref 0.0–0.2)

## 2022-05-01 LAB — LIPOPROTEIN A (LPA): Lipoprotein (a): 131.3 nmol/L — ABNORMAL HIGH (ref <75.0)

## 2022-05-01 NOTE — Progress Notes (Signed)
Progress Note  Patient Name: Pam [name] Date of Encounter: 05/01/2022  Primary Cardiologist: Lauree Chandler, MD  Subjective   No chest pain.  Did have oxygen desaturation overnight.  Inpatient Medications    Scheduled Meds:  atorvastatin  80 mg Oral Daily   bisoprolol  5 mg Oral Daily   cefdinir  300 mg Oral Q12H   clopidogrel  75 mg Oral Daily   empagliflozin  10 mg Oral Daily   furosemide  20 mg Oral Daily   guaiFENesin  600 mg Oral BID   heparin  5,000 Units Subcutaneous Q8H   levothyroxine  37.5 mcg Oral QAC breakfast   losartan  12.5 mg Oral Daily   pneumococcal 20-valent conjugate vaccine  0.5 mL Intramuscular Tomorrow-1000   predniSONE  40 mg Oral Q breakfast   ranolazine  1,000 mg Oral BID   sodium chloride flush  3 mL Intravenous Q12H   sodium chloride flush  3 mL Intravenous Q12H   valACYclovir  1,000 mg Oral Daily   Continuous Infusions:  sodium chloride 250 mL (04/29/22 2023)   PRN Meds: sodium chloride, acetaminophen **OR** acetaminophen, acetaminophen, ipratropium-albuterol, ondansetron (ZOFRAN) IV, mouth rinse, sodium chloride flush   Vital Signs    Vitals:   04/30/22 2008 05/01/22 0009 05/01/22 0411 05/01/22 0726  BP: (!) 125/90 133/60 121/67 138/81  Pulse: 66   80  Resp: 15   18  Temp: 98 F (36.7 C) 97.8 F (36.6 C) 97.8 F (36.6 C) 98.1 F (36.7 C)  TempSrc: Oral Oral Oral Oral  SpO2:    94%  Weight:      Height:        Intake/Output Summary (Last 24 hours) at 05/01/2022 0946 Last data filed at 05/01/2022 0411 Gross per 24 hour  Intake 960 ml  Output 1000 ml  Net -40 ml   Filed Weights   04/26/22 1501 04/27/22 0800 04/29/22 1303  Weight: 54.4 kg 54.4 kg 53.6 kg    Telemetry    Sinus rhythm. Personally reviewed.  ECG    An ECG dated 04/29/2022 was personally reviewed today and demonstrated:  Sinus rhythm with poor R wave progression.  Physical Exam   GEN: No acute distress.   Neck: No JVD. Cardiac: RRR without  gallop.  Respiratory: Nonlabored.  No wheezing. GI: Soft, nontender, bowel sounds present. MS: No edema. Neuro:  Nonfocal. Psych: Alert and oriented x 3. Normal affect.  Labs    Chemistry Recent Labs  Lab 04/26/22 1555 04/27/22 0252 04/29/22 0005 04/30/22 0035 05/01/22 0023  NA 126*   < > 130* 132* 130*  K 4.7   < > 4.7 4.3 4.0  CL 90*   < > 97* 98 97*  CO2 26   < > '24 25 22  '$ GLUCOSE 138*   < > 135* 137* 129*  BUN 13   < > '23 17 15  '$ CREATININE 0.96   < > 0.87 0.79 0.93  CALCIUM 9.0   < > 8.7* 8.7* 9.2  PROT 6.9  --   --   --   --   ALBUMIN 3.3*  --   --   --   --   AST 36  --   --   --   --   ALT 44  --   --   --   --   ALKPHOS 129*  --   --   --   --   BILITOT 0.6  --   --   --   --  GFRNONAA >60   < > >60 >60 >60  ANIONGAP 10   < > '9 9 11   '$ < > = values in this interval not displayed.     Hematology Recent Labs  Lab 04/29/22 0005 04/30/22 0035 05/01/22 0023  WBC 17.2* 12.2* 16.7*  RBC 2.81* 2.50* 2.56*  HGB 9.8* 8.9* 9.0*  HCT 27.5* 24.3* 25.5*  MCV 97.9 97.2 99.6  MCH 34.9* 35.6* 35.2*  MCHC 35.6 36.6* 35.3  RDW 14.1 14.3 14.3  PLT 451* 413* 454*    Cardiac Enzymes Recent Labs  Lab 04/26/22 1555 04/26/22 1904 04/27/22 0252  TROPONINIHS 184* 1,180* 938*    BNP Recent Labs  Lab 04/26/22 1556  BNP 713.3*     Radiology    CARDIAC CATHETERIZATION  Result Date: 04/29/2022   Prox LAD to Mid LAD lesion is 90% stenosed with 95% stenosed side branch in 1st Sept. Mid LAD to Dist LAD lesion is 100% stenosed.   Previously placed Prox Cx to Dist Cx stent of unknown type is  widely patent with 0% stenosed side branch in 1st Mrg.   Lat 1st Mrg lesion is 50% stenosed.   LV end diastolic pressure is moderately elevated.   There is no aortic valve stenosis. POST CATH DIAGNOSES Stable coronary disease with widely patent proximal to mid LCx stent.  At most 50% ostial OM1. Known occlusion of the LAD after SP1.  The ostium itself has a roughly 90% stenosis which  could be a source of ischemia.  There is a small diagonal branch comes off at the same location. Mild diffuse disease in the RCA. Moderately elevated EDP of 18 mmHg. Suspect troponin elevation was related to demand ischemia with known existing disease and insult to the heart via CHF exacerbation versus pneumonia. RECOMMENDATIONS Return to nursing unit for ongoing care. She appears to be borderline euvolemic Glenetta Hew, MD   Cardiac Studies   Echocardiogram 04/27/2022:  1. Left ventricular ejection fraction, by estimation, is 25 to 30%. The  left ventricle has severely decreased function. The left ventricle  demonstrates regional wall motion abnormalities (see scoring  diagram/findings for description). Left ventricular  diastolic parameters are consistent with Grade II diastolic dysfunction  (pseudonormalization). Elevated left ventricular end-diastolic pressure.   2. Right ventricular systolic function is normal. The right ventricular  size is normal. There is mildly elevated pulmonary artery systolic  pressure.   3. Left atrial size was moderately dilated.   4. The mitral valve is normal in structure. Trivial mitral valve  regurgitation. No evidence of mitral stenosis.   5. The aortic valve is normal in structure. Aortic valve regurgitation is  not visualized. No aortic stenosis is present.   6. The inferior vena cava is normal in size with greater than 50%  respiratory variability, suggesting right atrial pressure of 3 mmHg.   Assessment & Plan    1.  HFrEF with ischemic cardiomyopathy, LVEF 25 to 30%, moderate diastolic dysfunction and normal RV contraction.  GDMT gradually adjusted, now on bisoprolol, Jardiance, and losartan.  2.  Elevated high-sensitivity troponin I most consistent with demand ischemia.  Cardiac catheterization on 9/29 showed stable coronary anatomy with patent circumflex stent sites, known occlusion of the LAD with 90% ostial stenosis and a small second diagonal  branch in similar location, otherwise mildly diseased RCA and moderately elevated LVEDP of 18 mmHg.  No clear culprit for revascularization.  Continues on Plavix, Lipitor, and Ranexa.  3.  Community-acquired pneumonia in the setting of  COPD and acute hypoxic respiratory failure.  Per primary team.  No change to current cardiac regimen based on vital signs.  She is on Plavix, Lipitor, Ranexa, bisoprolol, Jardiance, and losartan.  Chart indicates Aldactone allergy, although sounds like this may have been an intolerance related to electrolyte disturbance, perhaps can be considered as an outpatient with close follow-up.  Signed, Rozann Lesches, MD  05/01/2022, 9:46 AM

## 2022-05-01 NOTE — Progress Notes (Signed)
Pt O2 saturation dropping between 87-90. Informed pt and tried to place nasal canula but pt refused oxygen. Pt states she does not want it and will be ok. Continuous monitoring

## 2022-05-01 NOTE — Progress Notes (Signed)
PROGRESS NOTE    Pam [name]  ZOX:[id number] DOB: [birth year] DOA: 04/26/2022 PCP: Loraine Leriche., MD  70/F with history of CAD, PCI and stents, ischemic cardiomyopathy, EF 25-30%, COPD, hypothyroidism presented to the ED with cough, shortness of breath. -In the ER temp was 100.5, tachypneic placed on BiPAP -Chest x-ray noted diffuse reticulonodular infiltrates suspicious for atypical pneumonia versus interstitial edema -CT chest noted advanced emphysema, interlobar septal thickening, right middle lobe infiltrate and trace pleural effusion -Troponin was 184, trended up to 1180 -Improving on antibiotics steroids and IV Lasix -Cardiac cath showed widely patent stent and no other disease besides the known occluded LAD.  Quite possible she could have demand ischemia with the extent of disease and residual LAD/SP1 segment.    Subjective: -Feels okay overall, upset about considerable drop in O2 sats last night, now improving, breathing better overall  Assessment and Plan:  Acute respiratory failure with hypoxia (HCC) -multifactorial, secondary to pneumonia/COPD and CHF -Weaned off BiPAP,  -Changed to oral antibiotics, prednisone taper -O2 sats dropped overnight, now improving, attempt to wean off and check ambulatory pulse ox  -Discharge planning   NSTEMI - known 2-vessel CAD including heavily calcified chronic total occlusion of the proximal/mid LAD, calcified 80% stenosis of the proximal Lcx/OM1, medical management recommended then -Troponin  trended up to 1180, now off IV heparin -Cards following, Cardiac cath showed widely patent stent and no other disease besides the known occluded LAD.   -Continue Ranexa, Plavix, statin, bisoprolol -Echo with EF 25% with regional wall motion abnormalities  Acute on chronic systolic CHF -Nonischemic cardiomyopathy, EF 25 to 30% and prior PCI, stents -Repeat echo as noted above, EF 25% with regional wall motion abnormality -Diuresed with IV  Lasix, continue Jardiance -allergic to Aldactone -Started on bisoprolol, p.o. Lasix, low-dose ARB -Consider Aldactone as outpatient  Community-acquired pneumonia Sepsis, POA COPD exacerbation -Ceftriaxone, azithromycin, changed to p.o. -IV steroids, DuoNebs, cut down steroids -Blood cultures are negative  Hyponatremia -Likely secondary to CHF and possibly SIADH, monitor  Hypothyroidism Continue home levothyroxine   DVT prophylaxis: Heparin subcutaneous Code Status: Full code Family Communication: Discussed with patient detail, no family at bedside Disposition Plan: Home tomorrow  Consultants: Cardiology   Procedures:   Antimicrobials:    Objective: Vitals:   04/30/22 2008 05/01/22 0009 05/01/22 0411 05/01/22 0726  BP: (!) 125/90 133/60 121/67 138/81  Pulse: 66   80  Resp: 15   18  Temp: 98 F (36.7 C) 97.8 F (36.6 C) 97.8 F (36.6 C) 98.1 F (36.7 C)  TempSrc: Oral Oral Oral Oral  SpO2:    94%  Weight:      Height:        Intake/Output Summary (Last 24 hours) at 05/01/2022 0930 Last data filed at 05/01/2022 0411 Gross per 24 hour  Intake 960 ml  Output 1000 ml  Net -40 ml   Filed Weights   04/26/22 1501 04/27/22 0800 04/29/22 1303  Weight: 54.4 kg 54.4 kg 53.6 kg    Examination:  Pleasant chronically ill female sitting up in bed, AAOx3, no distress HEENT: No JVD CVS: S1-S2, regular rhythm Lungs: Improving air movement, few scattered rhonchi Abdomen: Soft, nontender, bowel sounds present Extremities: No edema  Skin: No rashes Psychiatry: Anxious    Data Reviewed:   CBC: Recent Labs  Lab 04/26/22 1555 04/27/22 0252 04/27/22 0303 04/27/22 2359 04/29/22 0005 04/30/22 0035 05/01/22 0023  WBC 13.2* 11.0*  --  14.2* 17.2* 12.2* 16.7*  NEUTROABS 9.1*  --   --   --   --   --   --  HGB 12.6 11.6* 11.6* 10.6* 9.8* 8.9* 9.0*  HCT 35.1* 34.3* 34.0* 28.8* 27.5* 24.3* 25.5*  MCV 98.0 104.9*  --  96.0 97.9 97.2 99.6  PLT 469* 296  --  394 451*  413* 161*   Basic Metabolic Panel: Recent Labs  Lab 04/26/22 1555 04/27/22 0252 04/27/22 0303 04/27/22 2359 04/29/22 0005 04/30/22 0035 05/01/22 0023  NA 126* 131* 129* 131* 130* 132* 130*  K 4.7 4.4 3.8 3.6 4.7 4.3 4.0  CL 90* 96*  --  98 97* 98 97*  CO2 26 18*  --  '22 24 25 22  '$ GLUCOSE 138* 93  --  114* 135* 137* 129*  BUN 13 14  --  '15 23 17 15  '$ CREATININE 0.96 0.91  --  0.90 0.87 0.79 0.93  CALCIUM 9.0 8.9  --  8.5* 8.7* 8.7* 9.2  MG 1.8  --   --   --   --   --   --    GFR: Estimated Creatinine Clearance: 46.6 mL/min (by C-G formula based on SCr of 0.93 mg/dL). Liver Function Tests: Recent Labs  Lab 04/26/22 1555  AST 36  ALT 44  ALKPHOS 129*  BILITOT 0.6  PROT 6.9  ALBUMIN 3.3*   Recent Labs  Lab 04/26/22 1555  LIPASE 33   No results for input(s): "AMMONIA" in the last 168 hours. Coagulation Profile: No results for input(s): "INR", "PROTIME" in the last 168 hours. Cardiac Enzymes: No results for input(s): "CKTOTAL", "CKMB", "CKMBINDEX", "TROPONINI" in the last 168 hours. BNP (last 3 results) No results for input(s): "PROBNP" in the last 8760 hours. HbA1C: No results for input(s): "HGBA1C" in the last 72 hours. CBG: No results for input(s): "GLUCAP" in the last 168 hours. Lipid Profile: No results for input(s): "CHOL", "HDL", "LDLCALC", "TRIG", "CHOLHDL", "LDLDIRECT" in the last 72 hours. Thyroid Function Tests: No results for input(s): "TSH", "T4TOTAL", "FREET4", "T3FREE", "THYROIDAB" in the last 72 hours. Anemia Panel: No results for input(s): "VITAMINB12", "FOLATE", "FERRITIN", "TIBC", "IRON", "RETICCTPCT" in the last 72 hours. Urine analysis: No results found for: "COLORURINE", "APPEARANCEUR", "LABSPEC", "PHURINE", "GLUCOSEU", "HGBUR", "BILIRUBINUR", "KETONESUR", "PROTEINUR", "UROBILINOGEN", "NITRITE", "LEUKOCYTESUR" Sepsis Labs: '@LABRCNTIP'$ (procalcitonin:4,lacticidven:4)  ) Recent Results (from the past 240 hour(s))  SARS Coronavirus 2 by RT PCR  (hospital order, performed in [facility] hospital lab) *cepheid single result test* Anterior Nasal Swab     Status: None   Collection Time: 04/26/22  3:18 PM   Specimen: Anterior Nasal Swab  Result Value Ref Range Status   SARS Coronavirus 2 by RT PCR NEGATIVE NEGATIVE Final    Comment: (NOTE) SARS-CoV-2 target nucleic acids are NOT DETECTED.  The SARS-CoV-2 RNA is generally detectable in upper and lower respiratory specimens during the acute phase of infection. The lowest concentration of SARS-CoV-2 viral copies this assay can detect is 250 copies / mL. A negative result does not preclude SARS-CoV-2 infection and should not be used as the sole basis for treatment or other patient management decisions.  A negative result may occur with improper specimen collection / handling, submission of specimen other than nasopharyngeal swab, presence of viral mutation(s) within the areas targeted by this assay, and inadequate number of viral copies (<250 copies / mL). A negative result must be combined with clinical observations, patient history, and epidemiological information.  Fact Sheet for Patients:   https://www.patel.info/  Fact Sheet for Healthcare Providers: https://hall.com/  This test is not yet approved or  cleared by the [place] FDA and has been authorized for  detection and/or diagnosis of SARS-CoV-2 by FDA under an Emergency Use Authorization (EUA).  This EUA will remain in effect (meaning this test can be used) for the duration of the COVID-19 declaration under Section 564(b)(1) of the Act, 21 U.S.C. section 360bbb-3(b)(1), unless the authorization is terminated or revoked sooner.  Performed at [facility], [place] [address], [place], [place] [address]   Blood culture (routine x 2)     Status: None (Preliminary result)   Collection Time: 04/26/22  3:21 PM   Specimen: BLOOD  Result Value Ref Range Status   Specimen  Description BLOOD RIGHT ANTECUBITAL  Final   Special Requests   Final    BOTTLES DRAWN AEROBIC AND ANAEROBIC Blood Culture adequate volume   Culture   Final    NO GROWTH 4 DAYS Performed at [facility], [place] [address] [place], [place] [address]    Report Status PENDING  Incomplete  Blood culture (routine x 2)     Status: None (Preliminary result)   Collection Time: 04/26/22  3:26 PM   Specimen: BLOOD  Result Value Ref Range Status   Specimen Description BLOOD LEFT ANTECUBITAL  Final   Special Requests   Final    BOTTLES DRAWN AEROBIC AND ANAEROBIC Blood Culture results may not be optimal due to an excessive volume of blood received in culture bottles   Culture   Final    NO GROWTH 4 DAYS Performed at [facility], [place] [address], [place], [place] [address]    Report Status PENDING  Incomplete  MRSA Next Gen by PCR, Nasal     Status: None   Collection Time: 04/27/22  7:08 PM   Specimen: Nasal Mucosa; Nasal Swab  Result Value Ref Range Status   MRSA by PCR Next Gen NOT DETECTED NOT DETECTED Final    Comment: (NOTE) The GeneXpert MRSA Assay (FDA approved for NASAL specimens only), is one component of a comprehensive MRSA colonization surveillance program. It is not intended to diagnose MRSA infection nor to guide or monitor treatment for MRSA infections. Test performance is not FDA approved in patients less than 36 years old. Performed at [facility], [place] [address], [place],  [address]      Radiology Studies: CARDIAC CATHETERIZATION  Result Date: 04/29/2022   Prox LAD to Mid LAD lesion is 90% stenosed with 95% stenosed side branch in 1st Sept. Mid LAD to Dist LAD lesion is 100% stenosed.   Previously placed Prox Cx to Dist Cx stent of unknown type is  widely patent with 0% stenosed side branch in 1st Mrg.   Lat 1st Mrg lesion is 50% stenosed.   LV end diastolic pressure is moderately elevated.   There is no aortic valve stenosis. POST CATH DIAGNOSES  Stable coronary disease with widely patent proximal to mid LCx stent.  At most 50% ostial OM1. Known occlusion of the LAD after SP1.  The ostium itself has a roughly 90% stenosis which could be a source of ischemia.  There is a small diagonal branch comes off at the same location. Mild diffuse disease in the RCA. Moderately elevated EDP of 18 mmHg. Suspect troponin elevation was related to demand ischemia with known existing disease and insult to the heart via CHF exacerbation versus pneumonia. RECOMMENDATIONS Return to nursing unit for ongoing care. She appears to be borderline euvolemic Glenetta Hew, MD    Scheduled Meds:  atorvastatin  80 mg Oral Daily   bisoprolol  5  mg Oral Daily   cefdinir  300 mg Oral Q12H   clopidogrel  75 mg Oral Daily   empagliflozin  10 mg Oral Daily   furosemide  20 mg Oral Daily   guaiFENesin  600 mg Oral BID   heparin  5,000 Units Subcutaneous Q8H   levothyroxine  37.5 mcg Oral QAC breakfast   losartan  12.5 mg Oral Daily   pneumococcal 20-valent conjugate vaccine  0.5 mL Intramuscular Tomorrow-1000   predniSONE  40 mg Oral Q breakfast   ranolazine  1,000 mg Oral BID   sodium chloride flush  3 mL Intravenous Q12H   sodium chloride flush  3 mL Intravenous Q12H   valACYclovir  1,000 mg Oral Daily   Continuous Infusions:  sodium chloride 250 mL (04/29/22 2023)     LOS: 5 days    Time spent: 93mn    PDomenic Polite MD [facility]   05/01/2022, 9:30 AM

## 2022-05-01 NOTE — Plan of Care (Signed)
  Problem: Clinical Measurements: Goal: Cardiovascular complication will be avoided Outcome: Progressing   Problem: Activity: Goal: Risk for activity intolerance will decrease Outcome: Progressing   Problem: Nutrition: Goal: Adequate nutrition will be maintained Outcome: Progressing   Problem: Elimination: Goal: Will not experience complications related to urinary retention Outcome: Progressing   Problem: Pain Managment: Goal: General experience of comfort will improve Outcome: Progressing   Problem: Clinical Measurements: Goal: Respiratory complications will improve Outcome: Not Progressing   Problem: Coping: Goal: Level of anxiety will decrease Outcome: Not Progressing

## 2022-05-01 NOTE — Progress Notes (Signed)
Mobility Specialist Progress Note:   05/01/22 1624  Mobility  Activity Refused mobility   Pt refused mobility after max encouragement d/t fatigue from already walking in room. Will f/u as able.    Pam [name] Mobility Specialist-Acute Rehab Secure Chat only

## 2022-05-02 ENCOUNTER — Encounter (HOSPITAL_COMMUNITY): Payer: Self-pay | Admitting: Cardiology

## 2022-05-02 ENCOUNTER — Telehealth (HOSPITAL_COMMUNITY): Payer: Self-pay

## 2022-05-02 ENCOUNTER — Telehealth: Payer: Self-pay | Admitting: Cardiovascular Disease

## 2022-05-02 ENCOUNTER — Other Ambulatory Visit (HOSPITAL_COMMUNITY): Payer: Self-pay

## 2022-05-02 DIAGNOSIS — J9601 Acute respiratory failure with hypoxia: Secondary | ICD-10-CM | POA: Diagnosis not present

## 2022-05-02 LAB — CBC
HCT: 24.4 % — ABNORMAL LOW (ref 36.0–46.0)
Hemoglobin: 8.8 g/dL — ABNORMAL LOW (ref 12.0–15.0)
MCH: 35.8 pg — ABNORMAL HIGH (ref 26.0–34.0)
MCHC: 36.1 g/dL — ABNORMAL HIGH (ref 30.0–36.0)
MCV: 99.2 fL (ref 80.0–100.0)
Platelets: 494 10*3/uL — ABNORMAL HIGH (ref 150–400)
RBC: 2.46 MIL/uL — ABNORMAL LOW (ref 3.87–5.11)
RDW: 14.3 % (ref 11.5–15.5)
WBC: 16.9 10*3/uL — ABNORMAL HIGH (ref 4.0–10.5)
nRBC: 0.9 % — ABNORMAL HIGH (ref 0.0–0.2)

## 2022-05-02 LAB — BASIC METABOLIC PANEL
Anion gap: 10 (ref 5–15)
BUN: 16 mg/dL (ref 8–23)
CO2: 24 mmol/L (ref 22–32)
Calcium: 9.4 mg/dL (ref 8.9–10.3)
Chloride: 97 mmol/L — ABNORMAL LOW (ref 98–111)
Creatinine, Ser: 0.83 mg/dL (ref 0.44–1.00)
GFR, Estimated: >60 mL/min (ref >60)
Glucose, Bld: 119 mg/dL — ABNORMAL HIGH (ref 70–99)
Potassium: 3.8 mmol/L (ref 3.5–5.1)
Sodium: 131 mmol/L — ABNORMAL LOW (ref 135–145)

## 2022-05-02 MED ORDER — EMPAGLIFLOZIN 10 MG PO TABS
10.0000 mg | ORAL_TABLET | Freq: Every day | ORAL | 0 refills | Status: DC
  Filled 2022-05-02: qty 30, 30d supply, fill #0

## 2022-05-02 MED ORDER — BISOPROLOL FUMARATE 5 MG PO TABS
5.0000 mg | ORAL_TABLET | Freq: Every day | ORAL | 0 refills | Status: DC
  Filled 2022-05-02: qty 30, 30d supply, fill #0

## 2022-05-02 MED ORDER — PREDNISONE 20 MG PO TABS
20.0000 mg | ORAL_TABLET | Freq: Every day | ORAL | 0 refills | Status: AC
  Filled 2022-05-02: qty 3, 3d supply, fill #0

## 2022-05-02 MED ORDER — LOSARTAN POTASSIUM 25 MG PO TABS
12.5000 mg | ORAL_TABLET | Freq: Every day | ORAL | 0 refills | Status: DC
  Filled 2022-05-02: qty 45, 90d supply, fill #0

## 2022-05-02 MED ORDER — FUROSEMIDE 20 MG PO TABS
20.0000 mg | ORAL_TABLET | Freq: Every day | ORAL | 0 refills | Status: DC
  Filled 2022-05-02: qty 30, 30d supply, fill #0

## 2022-05-02 MED ORDER — CEFDINIR 300 MG PO CAPS
300.0000 mg | ORAL_CAPSULE | Freq: Two times a day (BID) | ORAL | 0 refills | Status: AC
  Filled 2022-05-02: qty 4, 2d supply, fill #0

## 2022-05-02 NOTE — Plan of Care (Signed)

## 2022-05-02 NOTE — Telephone Encounter (Signed)
  Pt c/o medication issue:  1. Name of Medication: furosemide (LASIX) 20 MG tablet  2. How are you currently taking this medication (dosage and times per day)? Take 1 tablet (20 mg total) by mouth daily. TAKE 1 TABLET BY MOUTH EVERY MONDAY, WEDNESDAY, AND FRIDAY  3. Are you having a reaction (difficulty breathing--STAT)? No   4. What is your medication issue? Pt said, while she was in the hospital this medication was change to take 1 tablet everyday. She wants to change in her records since it still say TAKE 1 TABLET BY MOUTH EVERY MONDAY, [place], [place]

## 2022-05-02 NOTE — Progress Notes (Signed)
Physical Therapy Treatment Patient Details Name: Pam [name] MRN: [id number] DOB: 08/18/1950 Today's Date: 05/02/2022   History of Present Illness Pt is a 71 y.o. female admitted 04/26/22 with cough, SOB. CXR suspicious for atypical PNA vs interstitial edema. Chest CT with advanced epmhysema, trace pleural effusion. Workup for acute hypoxic respiratory failure secondary to PNA, COPD, CHF, concern for NSTEMI. Cardiac cath 9/29. PMH includes CAD (s/p PCI), ischemic cardiomyopathy (EF 25-30%), COPD.   PT Comments    Pt progressing with mobility. Pt ambulatory without DME, supervision for safety/lines. Noted improvements in DOE with SpO2 >/96% on RA while ambulating. Pt preparing for d/c home this morning; reviewed education, pt reports no further questions or concerns. If to remain admitted, will continue to follow acutely.    Recommendations for follow up therapy are one component of a multi-disciplinary discharge planning process, led by the attending physician.  Recommendations may be updated based on patient status, additional functional criteria and insurance authorization.  Follow Up Recommendations  Home health PT     Assistance Recommended at Discharge Intermittent Supervision/Assistance  Patient can return home with the following A little help with bathing/dressing/bathroom;Assistance with cooking/housework;Help with stairs or ramp for entrance   Equipment Recommendations  None recommended by PT    Recommendations for Other Services       Precautions / Restrictions Precautions Precautions: Fall Restrictions Weight Bearing Restrictions: No     Mobility  Bed Mobility Overal bed mobility: Modified Independent                  Transfers Overall transfer level: Independent Equipment used: None Transfers: Sit to/from Stand                  Ambulation/Gait Ambulation/Gait assistance: Scientist, forensic (Feet): 220 Feet Assistive device: None Gait  Pattern/deviations: Step-through pattern, Decreased stride length Gait velocity: Decreased     General Gait Details: slow, guarded, but steady gait without DME, supervision for safety/lines; no overt instability or LOB   Stairs             Wheelchair Mobility    Modified Rankin (Stroke Patients Only)       Balance Overall balance assessment: Needs assistance   Sitting balance-Leahy Scale: Good     Standing balance support: No upper extremity supported, During functional activity Standing balance-Leahy Scale: Good                              Cognition Arousal/Alertness: Awake/alert Behavior During Therapy: WFL for tasks assessed/performed Overall Cognitive Status: Within Functional Limits for tasks assessed                                 General Comments: WFL for simple tasks, not formally assessed        Exercises      General Comments General comments (skin integrity, edema, etc.): SpO2 >/96% on RA with activity; no DOE noted, pt endorses fatigue. reviewed educ re: activity recommendations, energy conservation strategies      Pertinent Vitals/Pain Pain Assessment Pain Assessment: No/denies pain    Home Living                          Prior Function            PT Goals (current goals can now be found in the care  plan section) Progress towards PT goals: Progressing toward goals    Frequency    Min 3X/week      PT Plan Current plan remains appropriate    Co-evaluation              AM-PAC PT "6 Clicks" Mobility   Outcome Measure  Help needed turning from your back to your side while in a flat bed without using bedrails?: None Help needed moving from lying on your back to sitting on the side of a flat bed without using bedrails?: None Help needed moving to and from a bed to a chair (including a wheelchair)?: None Help needed standing up from a chair using your arms (e.g., wheelchair or bedside  chair)?: None Help needed to walk in hospital room?: A Little Help needed climbing 3-5 steps with a railing? : A Little 6 Click Score: 22    End of Session   Activity Tolerance: Patient tolerated treatment well Patient left: in bed;with call bell/phone within reach Nurse Communication: Mobility status PT Visit Diagnosis: Other abnormalities of gait and mobility (R26.89);Muscle weakness (generalized) (M62.81)     Time: 1587-2761 PT Time Calculation (min) (ACUTE ONLY): 9 min  Charges:  $Self Care/Home Management: [place], PT, DPT Acute Rehabilitation Services  Personal: [place] [facility] Office: [place] 05/02/2022, 11:18 AM

## 2022-05-02 NOTE — Plan of Care (Signed)

## 2022-05-02 NOTE — Telephone Encounter (Signed)
Received notification from [facility] regarding a prior authorization for Jardiance '10mg'$ . Authorization has been APPROVED from 04/02/2022 to 05/02/2023.     Authorization # PA Case ID: [id number] Key # [id number]

## 2022-05-02 NOTE — Telephone Encounter (Signed)
Called patient back to let her know a message would be sent to Dr. Domenic Polite, last cardiologist to see her in the hospital, to see if he can clarify if patient needs to be taking lasix 20 mg daily or Monday, Wednesday, Friday.

## 2022-05-02 NOTE — Progress Notes (Signed)
Heart Failure Stewardship Pharmacist Progress Note   PCP: Loraine Leriche., MD PCP-Cardiologist: Lauree Chandler, MD    HPI:  71 yo F with PMH of CAD, CHF, ICM, HTN, HLD, hypothyroidism, and osteoporosis.  She was admitted to [facility] in December 2017 with a NSTEMI. She was found to have a chronic occlusion of the mid LAD and severe, calcified stenosis in the proximal Circumflex.  LVEF was 30% by LV gram and 35-40% by echo. Cardiac MRI December 2017 with non-viability of the anterior wall in the LAD territory. LVEF 38% by cardiac MRI. The Circumflex stenosis was treated with orbital atherectomy and stenting using a drug eluting stent in a staged procedure in January 2018. Echo November 2020 with LVEF 25-30%. Dilated LV. Mild MR. ICD placed in January 2021 and followed by Dr. Lennie Odor  She presented to the ED on 9/26 with shortness of breath, hypoxia, and a productive cough. CXR with PNA vs interstitial edema. CTA negative for PE and favoring infection. Started antibiotics for PNA. An ECHO was done on 9/27 and LVEF was 25-30% with G2DD, RV normal, and trivial MR. LHC on 9/29 with stable coronary anatomy with patent circumflex stent sites, known occlusion of the LAD with 90% ostial stenosis and a small second diagonal branch in similar location, otherwise mildly diseased RCA and moderately elevated LVEDP of 18 mmHg.  Discharge HF Medications: Diuretic: furosemide 20 mg MWF Beta Blocker: bisoprolol 5 mg daily ACE/ARB/ARNI: losartan 12.5 mg daily SGLT2i: Jardiance 10 mg daily  Prior to admission HF Medications: Diuretic: furosemide 20 mg MWF Beta blocker: carvedilol 6.25 mg BID ACE/ARB/ARNI: losartan 100 mg daily  Pertinent Lab Values: Serum creatinine 0.83, BUN 16, Potassium 3.8, Sodium 131, BNP 713.3, Magnesium 1.8   Vital Signs: Weight: 118 lbs (admission weight: 119 lbs) Blood pressure: 120/50s  Heart rate: 60-80s  I/O: -2.1L yesterday, net -1.3L  Medication Assistance /  Insurance Benefits Check: Does the patient have prescription insurance?  Yes Type of insurance plan: [place]:  Prior to admission outpatient pharmacy: [facility] Is the patient willing to use [facility] at discharge? Yes Is the patient willing to transition their outpatient pharmacy to utilize a [facility] outpatient pharmacy?   Pending    Assessment: 1. Acute on chronic systolic CHF (LVEF 16-10%), due to ICM. NYHA class II symptoms. - Agree with resuming furosemide 20 mg MWF on discharge. Strict I/Os and daily weights.  - Continue bisoprolol 5 mg daily - Continue losartan 12.5 mg daily on discharge - Consider adding spironolactone 12.5 mg daily at follow up pending BP trends - Continue Jardiance 10 mg daily   Plan: 1) Medication changes recommended at this time: - Discharge today, consider adding spironolactone at follow up   2) Patient assistance: - Has [employer] plan - should be able to use monthly copay cards  3)  Education  - Patient has been educated on current HF medications and potential additions to HF medication regimen - Patient verbalizes understanding that over the next few months, these medication doses may change and more medications may be added to optimize HF regimen - Patient has been educated on basic disease state pathophysiology and goals of therapy   Kerby Nora, PharmD, BCPS Heart Failure Cytogeneticist Phone [phone]

## 2022-05-02 NOTE — Telephone Encounter (Signed)
Called patient back to let her know that for now she needs to resume taking Furosemide on Monday, Wednesday, and Friday. Informed her a message has been sent to Dr. Angelena Form for advisement. Patient agreed to plan.

## 2022-05-03 MED ORDER — FUROSEMIDE 20 MG PO TABS: 20.0000 mg | ORAL_TABLET | ORAL | 3 refills | Status: DC

## 2022-05-03 NOTE — Discharge Summary (Signed)
Physician Discharge Summary  Pam [name] XKP:[id number] DOB: 30-Jun-1951 DOA: 04/26/2022  PCP: Loraine Leriche., MD  Admit date: 04/26/2022 Discharge date: 05/02/2022  Time spent: 35 minutes  Recommendations for Outpatient Follow-up:  Cardiology Dr. Angelena Form in 1 month CHF TOC clinic in 10 days  Discharge Diagnoses:  Principal Problem:   Acute respiratory failure with hypoxia [facility]) Active Problems:   Hypothyroidism   Hyponatremia   Essential hypertension   Acute on chronic systolic CHF (congestive heart failure) (HCC)   Elevated troponin   Sepsis ([place])   CAP (community acquired pneumonia)   Thrombocytosis   Shortness of breath   HFrEF (heart failure with reduced ejection fraction) [facility])   Discharge Condition: Improved  Diet recommendation: Low-sodium, heart healthy  Filed Weights   04/26/22 1501 04/27/22 0800 04/29/22 1303  Weight: 54.4 kg 54.4 kg 53.6 kg    History of present illness:  71/F with history of CAD, PCI and stents, ischemic cardiomyopathy, EF 25-30%, COPD, hypothyroidism presented to the ED with cough, shortness of breath. -In the ER temp was 100.5, tachypneic placed on BiPAP -Chest x-ray noted diffuse reticulonodular infiltrates suspicious for atypical pneumonia versus interstitial edema -CT chest noted advanced emphysema, interlobar septal thickening, right middle lobe infiltrate and trace pleural effusion -Troponin was 184, trended up to [place] [facility] Course:   Acute respiratory failure with hypoxia (HCC) -multifactorial, secondary to pneumonia/COPD and CHF -Weaned off BiPAP,  -Changed to oral antibiotics, prednisone taper -Improved, weaned off O2   NSTEMI - known 2-vessel CAD including heavily calcified chronic total occlusion of the proximal/mid LAD, calcified 80% stenosis of the proximal Lcx/OM1, medical management recommended then -Troponin  trended up to 1180, now off IV heparin -Cards following, Cardiac cath showed widely patent stent  and no other disease besides the known occluded LAD.   -Continue Ranexa, Plavix, statin, bisoprolol -Echo with EF 25% with regional wall motion abnormalities   Acute on chronic systolic CHF -Nonischemic cardiomyopathy, EF 25 to 30% and prior PCI, stents -Repeat echo as noted above, EF 25% with regional wall motion abnormality -Diuresed with IV Lasix now euvolemic, continue Jardiance -allergic to Aldactone -Started on bisoprolol, p.o. Lasix, low-dose ARB -Consider Aldactone as outpatient   Community-acquired pneumonia Sepsis, POA COPD exacerbation -Ceftriaxone, azithromycin, changed to p.o. -IV steroids, DuoNebs, cut down steroids -Blood cultures are negative   Hyponatremia -Likely secondary to CHF and possibly SIADH, monitor   Hypothyroidism Continue home levothyroxine   Discharge Exam: Vitals:   05/02/22 0335 05/02/22 0810  BP: (!) 120/51 (!) 126/59  Pulse: 60   Resp: 17   Temp: 98.3 F (36.8 C) 97.7 F (36.5 C)  SpO2: 93% 91%   Pleasant chronically ill female sitting up in bed, AAOx3, no distress HEENT: No JVD CVS: S1-S2, regular rhythm Lungs: Improving air movement, few scattered rhonchi Abdomen: Soft, nontender, bowel sounds present Extremities: No edema  Skin: No rashes Psychiatry: Anxious    Discharge Instructions   Discharge Instructions     Diet - low sodium heart healthy   Complete by: As directed    Increase activity slowly   Complete by: As directed    No wound care   Complete by: As directed       Allergies as of 05/02/2022       Reactions   Lisinopril Cough   Dry cough   Spironolactone Other (See Comments)   Low sodium        Medication List     STOP taking these medications  carvedilol 6.25 MG tablet Commonly known as: COREG         TAKE these medications    acetaminophen 500 MG tablet Commonly known as: TYLENOL Take 1,000 mg by mouth every 6 (six) hours as needed for moderate pain.   alendronate 70 MG  tablet Commonly known as: FOSAMAX TAKE 1 TABLET(70 MG) BY MOUTH 1 TIME A WEEK WITH A FULL GLASS OF WATER AND ON AN EMPTY STOMACH What changed:  how much to take how to take this when to take this additional instructions   atorvastatin 80 MG tablet Commonly known as: LIPITOR TAKE 1 TABLET(80 MG) BY MOUTH DAILY What changed: See the new instructions.   bisoprolol 5 MG tablet Commonly known as: ZEBETA Take 1 tablet (5 mg total) by mouth daily.   cefdinir 300 MG capsule Commonly known as: OMNICEF Take 1 capsule (300 mg total) by mouth every 12 (twelve) hours for 2 days.   clopidogrel 75 MG tablet Commonly known as: PLAVIX TAKE 1 TABLET(75 MG) BY MOUTH DAILY WITH BREAKFAST What changed:  how much to take how to take this when to take this additional instructions   Jardiance 10 MG Tabs tablet Generic drug: empagliflozin Take 1 tablet (10 mg total) by mouth daily.   levothyroxine 25 MCG tablet Commonly known as: SYNTHROID Take 37.5 mcg by mouth daily before breakfast.   losartan 25 MG tablet Commonly known as: COZAAR Take 0.5 tablets (12.5 mg total) by mouth daily. What changed:  medication strength See the new instructions.   nitroGLYCERIN 0.4 MG SL tablet Commonly known as: NITROSTAT Place 1 tablet (0.4 mg total) under the tongue every 5 (five) minutes x 3 doses as needed for chest pain.   predniSONE 20 MG tablet Commonly known as: DELTASONE Take 1 tablet (20 mg total) by mouth daily with breakfast for 3 days.   ranolazine 1000 MG SR tablet Commonly known as: RANEXA Take 1 tablet (1,000 mg total) by mouth 2 (two) times daily.   valACYclovir 1000 MG tablet Commonly known as: VALTREX Take 1,000 mg by mouth daily.   furosemide 20 MG tablet Commonly known as: LASIX Take '20mg'$  by mouth daily       Allergies  Allergen Reactions   Lisinopril Cough    Dry cough   Spironolactone Other (See Comments)    Low sodium    Follow-up Information     [facility]. Go in 9 day(s).   Specialty: Cardiology Why: Hospital follow up PLEASE bring a current medication list to appointment FREE valet parking, Entrance C, off [employer] information: [address] [id number] [place] [address] [place], [place]., MD. Schedule an appointment as soon as possible for a visit in 1 week(s).   Specialty: Internal Medicine Contact information: [place] [address] [phone]                  The results of significant diagnostics from this hospitalization (including imaging, microbiology, ancillary and laboratory) are listed below for reference.    Significant Diagnostic Studies: CARDIAC CATHETERIZATION  Result Date: 04/29/2022   Prox LAD to Mid LAD lesion is 90% stenosed with 95% stenosed side branch in 1st Sept. Mid LAD to Dist LAD lesion is 100% stenosed.   Previously placed Prox Cx to Dist Cx stent of unknown type is  widely patent with 0% stenosed side branch in 1st Mrg.  Lat 1st Mrg lesion is 50% stenosed.   LV end diastolic pressure is moderately elevated.   There is no aortic valve stenosis. POST CATH DIAGNOSES Stable coronary disease with widely patent proximal to mid LCx stent.  At most 50% ostial OM1. Known occlusion of the LAD after SP1.  The ostium itself has a roughly 90% stenosis which could be a source of ischemia.  There is a small diagonal branch comes off at the same location. Mild diffuse disease in the RCA. Moderately elevated EDP of 18 mmHg. Suspect troponin elevation was related to demand ischemia with known existing disease and insult to the heart via CHF exacerbation versus pneumonia. RECOMMENDATIONS Return to nursing unit for ongoing care. She appears to be borderline euvolemic Glenetta Hew, MD  ECHOCARDIOGRAM COMPLETE  Result Date: 04/27/2022    ECHOCARDIOGRAM REPORT   Patient Name:   Pam [name] Date of  Exam: 04/27/2022 Medical Rec #:  [id number]   Height:       63.0 in Accession #:    [id number]  Weight:       119.9 lb Date of Birth:  1951/03/15  BSA:          1.556 m Patient Age:    49 years    BP:           118/56 mmHg Patient Gender: F           HR:           79 bpm. Exam Location:  Inpatient Procedure: 2D Echo, Cardiac Doppler, Color Doppler and Intracardiac            Opacification Agent Indications:    CHF  History:        Patient has prior history of Echocardiogram examinations, most                 recent 06/19/2019. CHF and Cardiomyopathy, CAD, Abnormal ECG,                 Signs/Symptoms:Chest Pain; Risk Factors:Hypertension and                 Dyslipidemia.  Sonographer:    Greer Pickerel Referring Phys: [id number] [place] T TU  Sonographer Comments: Technically challenging study due to limited acoustic windows, suboptimal parasternal window and no subcostal window. Image acquisition challenging due to patient body habitus and Image acquisition challenging due to respiratory motion. IMPRESSIONS  1. Left ventricular ejection fraction, by estimation, is 25 to 30%. The left ventricle has severely decreased function. The left ventricle demonstrates regional wall motion abnormalities (see scoring diagram/findings for description). Left ventricular diastolic parameters are consistent with Grade II diastolic dysfunction (pseudonormalization). Elevated left ventricular end-diastolic pressure.  2. Right ventricular systolic function is normal. The right ventricular size is normal. There is mildly elevated pulmonary artery systolic pressure.  3. Left atrial size was moderately dilated.  4. The mitral valve is normal in structure. Trivial mitral valve regurgitation. No evidence of mitral stenosis.  5. The aortic valve is normal in structure. Aortic valve regurgitation is not visualized. No aortic stenosis is present.  6. The inferior vena cava is normal in size with greater than 50% respiratory variability, suggesting  right atrial pressure of 3 mmHg. FINDINGS  Left Ventricle: Left ventricular ejection fraction, by estimation, is 25 to 30%. The left ventricle has severely decreased function. The left ventricle demonstrates regional wall motion abnormalities. Definity contrast agent was given IV to delineate the left ventricular endocardial borders. The left ventricular internal  cavity size was normal in size. There is no left ventricular hypertrophy. Left ventricular diastolic parameters are consistent with Grade II diastolic dysfunction (pseudonormalization). Elevated left ventricular end-diastolic pressure.  LV Wall Scoring: The apical inferior segment and apex are akinetic. The mid and distal anterior wall, mid and distal lateral wall, mid and distal anterior septum, inferior wall, and mid inferoseptal segment are hypokinetic. The antero-lateral wall, basal anteroseptal segment, basal inferolateral segment, basal anterior segment, and basal inferoseptal segment are normal. Right Ventricle: The right ventricular size is normal. No increase in right ventricular wall thickness. Right ventricular systolic function is normal. There is mildly elevated pulmonary artery systolic pressure. The tricuspid regurgitant velocity is 2.88  m/s, and with an assumed right atrial pressure of 8 mmHg, the estimated right ventricular systolic pressure is 30.1 mmHg. Left Atrium: Left atrial size was moderately dilated. Right Atrium: Right atrial size was normal in size. Pericardium: There is no evidence of pericardial effusion. Mitral Valve: The mitral valve is normal in structure. Trivial mitral valve regurgitation. No evidence of mitral valve stenosis. Tricuspid Valve: The tricuspid valve is normal in structure. Tricuspid valve regurgitation is mild . No evidence of tricuspid stenosis. Aortic Valve: The aortic valve is normal in structure. Aortic valve regurgitation is not visualized. No aortic stenosis is present. Pulmonic Valve: The pulmonic  valve was normal in structure. Pulmonic valve regurgitation is not visualized. No evidence of pulmonic stenosis. Aorta: The aortic root is normal in size and structure. Venous: The inferior vena cava is normal in size with greater than 50% respiratory variability, suggesting right atrial pressure of 3 mmHg. IAS/Shunts: No atrial level shunt detected by color flow Doppler. Additional Comments: A device lead is visualized.  LEFT VENTRICLE PLAX 2D LVOT diam:     0.85 cm     Diastology LV SV:         10          LV e' medial:    5.10 cm/s LV SV Index:   7           LV E/e' medial:  17.3 LVOT Area:     0.57 cm    LV e' lateral:   4.95 cm/s                            LV E/e' lateral: 17.8  LV Volumes (MOD) LV vol d, MOD A2C: 79.9 ml LV vol d, MOD A4C: 77.5 ml LV vol s, MOD A2C: 49.6 ml LV vol s, MOD A4C: 60.7 ml LV SV MOD A2C:     30.3 ml LV SV MOD A4C:     77.5 ml LV SV MOD BP:      24.0 ml RIGHT VENTRICLE RV S prime:     14.20 cm/s TAPSE (M-mode): 1.5 cm LEFT ATRIUM           Index        RIGHT ATRIUM           Index LA Vol (A4C): 55.4 ml 35.61 ml/m  RA Area:     12.70 cm                                    RA Volume:   30.10 ml  19.35 ml/m  AORTIC VALVE LVOT Vmax:   108.00 cm/s LVOT Vmean:  66.100 cm/s LVOT VTI:    0.185  m  AORTA Ao Root diam: 2.30 cm MITRAL VALVE               TRICUSPID VALVE MV Area (PHT): 3.72 cm    TR Peak grad:   33.2 mmHg MV Decel Time: 204 msec    TR Vmax:        288.00 cm/s MV E velocity: 88.30 cm/s MV A velocity: 81.80 cm/s  SHUNTS MV E/A ratio:  1.08        Systemic VTI:  0.18 m                            Systemic Diam: 0.85 cm Skeet Latch MD Electronically signed by Skeet Latch MD Signature Date/Time: 04/27/2022/2:44:38 PM    Final    CT Angio Chest PE W and/or Wo Contrast  Result Date: 04/26/2022 CLINICAL DATA:  Shortness of breath; PE suspected EXAM: CT ANGIOGRAPHY CHEST WITH CONTRAST TECHNIQUE: Multidetector CT imaging of the chest was performed using the standard  protocol during bolus administration of intravenous contrast. Multiplanar CT image reconstructions and MIPs were obtained to evaluate the vascular anatomy. RADIATION DOSE REDUCTION: This exam was performed according to the departmental dose-optimization program which includes automated exposure control, adjustment of the mA and/or kV according to patient size and/or use of iterative reconstruction technique. CONTRAST:  50m OMNIPAQUE IOHEXOL 350 MG/ML SOLN COMPARISON:  Radiographs earlier today and CT 12/06/2021 FINDINGS: Cardiovascular: Satisfactory opacification of the pulmonary arteries to the segmental level. No evidence of pulmonary embolism. Aortic and coronary artery atherosclerosis. Coronary stenting. Small pericardial effusion. Left chest wall ICD. Mediastinum/Nodes: No enlarged mediastinal, hilar, or axillary lymph nodes. Thyroid gland, trachea, and esophagus demonstrate no significant findings. Lungs/Pleura: Advanced emphysema. Diffuse interlobular septal thickening greatest in the lower lobes. There is more confluence infiltrates in the right middle lobe, lingula and greatest in the posterior lower lobes. Bronchial wall thickening greatest in the lower lobes. Normal variant azygous fissure. Trace bilateral pleural effusions. Upper Abdomen: No acute abnormality though evaluation is compromised by respiratory motion. Musculoskeletal: No chest wall abnormality. No acute osseous findings. Body wall anasarca. Review of the MIP images confirms the above findings. IMPRESSION: 1. No acute pulmonary embolism. 2. Interlobular septal thickening with more confluent opacities in the lower lungs. This is favored to represent infection superimposed on a background of advanced emphysema though pulmonary edema could be considered. 3. Aortic Atherosclerosis (ICD10-I70.0) and Emphysema (ICD10-J43.9). 4. Coronary artery disease/stenting. 5. Small pericardial effusion. Electronically Signed   By: TPlacido SouM.D.   On:  04/26/2022 21:57   DG Chest Portable 1 View  Result Date: 04/26/2022 CLINICAL DATA:  Hypoxia. EXAM: PORTABLE CHEST 1 VIEW COMPARISON:  August 28, 2019 FINDINGS: Cardiac pacemaker in stable position. Calcific atherosclerotic disease and tortuosity of the aorta. Cardiomediastinal silhouette is normal. Mediastinal contours appear intact. Diffuse reticulonodular infiltration of the lungs with lower lobe predominance. Background upper lobe predominant emphysema. Osseous structures are without acute abnormality. Soft tissues are grossly normal. IMPRESSION: 1. Diffuse reticulonodular infiltration of the lungs with lower lobe predominance, suspicious for atypical pneumonia or interstitial edema with atypical presentation due to underlying chronic lung disease. 2. Background upper lobe predominant emphysema. Electronically Signed   By: DFidela SalisburyM.D.   On: 04/26/2022 15:47    Microbiology: Recent Results (from the past 240 hour(s))  SARS Coronavirus 2 by RT PCR ([facility]) *cepheid single result test* Anterior Nasal Swab  Status: None   Collection Time: 04/26/22  3:18 PM   Specimen: Anterior Nasal Swab  Result Value Ref Range Status   SARS Coronavirus 2 by RT PCR NEGATIVE NEGATIVE Final    Comment: (NOTE) SARS-CoV-2 target nucleic acids are NOT DETECTED.  The SARS-CoV-2 RNA is generally detectable in upper and lower respiratory specimens during the acute phase of infection. The lowest concentration of SARS-CoV-2 viral copies this assay can detect is 250 copies / mL. A negative result does not preclude SARS-CoV-2 infection and should not be used as the sole basis for treatment or other patient management decisions.  A negative result may occur with improper specimen collection / handling, submission of specimen other than nasopharyngeal swab, presence of viral mutation(s) within the areas targeted by this assay, and inadequate number of viral  copies (<250 copies / mL). A negative result must be combined with clinical observations, patient history, and epidemiological information.  Fact Sheet for Patients:   https://www.patel.info/  Fact Sheet for Healthcare Providers: https://hall.com/  This test is not yet approved or  cleared by the [place] FDA and has been authorized for detection and/or diagnosis of SARS-CoV-2 by FDA under an Emergency Use Authorization (EUA).  This EUA will remain in effect (meaning this test can be used) for the duration of the COVID-19 declaration under Section 564(b)(1) of the Act, 21 U.S.C. section 360bbb-3(b)(1), unless the authorization is terminated or revoked sooner.  Performed at [facility], [place] [address], [place], [place] [address]   Blood culture (routine x 2)     Status: None   Collection Time: 04/26/22  3:21 PM   Specimen: BLOOD  Result Value Ref Range Status   Specimen Description BLOOD RIGHT ANTECUBITAL  Final   Special Requests   Final    BOTTLES DRAWN AEROBIC AND ANAEROBIC Blood Culture adequate volume   Culture   Final    NO GROWTH 5 DAYS Performed at [facility], [place] [address], [place], [place] [address]    Report Status 05/01/2022 FINAL  Final  Blood culture (routine x 2)     Status: None   Collection Time: 04/26/22  3:26 PM   Specimen: BLOOD  Result Value Ref Range Status   Specimen Description BLOOD LEFT ANTECUBITAL  Final   Special Requests   Final    BOTTLES DRAWN AEROBIC AND ANAEROBIC Blood Culture results may not be optimal due to an excessive volume of blood received in culture bottles   Culture   Final    NO GROWTH 5 DAYS Performed at [facility], [place] [address] [place], [place] [address]    Report Status 05/01/2022 FINAL  Final  MRSA Next Gen by PCR, Nasal     Status: None   Collection Time: 04/27/22  7:08 PM   Specimen: Nasal Mucosa; Nasal Swab  Result Value Ref Range Status   MRSA  by PCR Next Gen NOT DETECTED NOT DETECTED Final    Comment: (NOTE) The GeneXpert MRSA Assay (FDA approved for NASAL specimens only), is one component of a comprehensive MRSA colonization surveillance program. It is not intended to diagnose MRSA infection nor to guide or monitor treatment for MRSA infections. Test performance is not FDA approved in patients less than 51 years old. Performed at [facility], [place] [address] [place], [place] [address]      Labs: Basic Metabolic Panel: Recent Labs  Lab 04/26/22 1555 04/27/22 1601 04/27/22 2359 04/29/22 0005 04/30/22 0932 05/01/22 0023  05/02/22 0006  NA 126*   < > 131* 130* 132* 130* 131*  K 4.7   < > 3.6 4.7 4.3 4.0 3.8  CL 90*   < > 98 97* 98 97* 97*  CO2 26   < > '22 24 25 22 24  '$ GLUCOSE 138*   < > 114* 135* 137* 129* 119*  BUN 13   < > '15 23 17 15 16  '$ CREATININE 0.96   < > 0.90 0.87 0.79 0.93 0.83  CALCIUM 9.0   < > 8.5* 8.7* 8.7* 9.2 9.4  MG 1.8  --   --   --   --   --   --    < > = values in this interval not displayed.   Liver Function Tests: Recent Labs  Lab 04/26/22 1555  AST 36  ALT 44  ALKPHOS 129*  BILITOT 0.6  PROT 6.9  ALBUMIN 3.3*   Recent Labs  Lab 04/26/22 1555  LIPASE 33   No results for input(s): "AMMONIA" in the last 168 hours. CBC: Recent Labs  Lab 04/26/22 1555 04/27/22 0252 04/27/22 2359 04/29/22 0005 04/30/22 0035 05/01/22 0023 05/02/22 0006  WBC 13.2*   < > 14.2* 17.2* 12.2* 16.7* 16.9*  NEUTROABS 9.1*  --   --   --   --   --   --   HGB 12.6   < > 10.6* 9.8* 8.9* 9.0* 8.8*  HCT 35.1*   < > 28.8* 27.5* 24.3* 25.5* 24.4*  MCV 98.0   < > 96.0 97.9 97.2 99.6 99.2  PLT 469*   < > 394 451* 413* 454* 494*   < > = values in this interval not displayed.   Cardiac Enzymes: No results for input(s): "CKTOTAL", "CKMB", "CKMBINDEX", "TROPONINI" in the last 168 hours. BNP: BNP (last 3 results) Recent Labs    04/26/22 1556  BNP 713.3*    ProBNP (last 3 results) No results for  input(s): "PROBNP" in the last 8760 hours.  CBG: No results for input(s): "GLUCAP" in the last 168 hours.     Signed:  Domenic Polite MD.  [facility] 05/03/2022, 2:30 PM

## 2022-05-03 NOTE — Addendum Note (Signed)
Addended by: Aris Georgia, Josedaniel Haye L on: 05/03/2022 10:39 AM   Modules accepted: Orders

## 2022-05-03 NOTE — Telephone Encounter (Signed)
Called patient to let her know Dr. Angelena Form had agreed to the plan of keeping lasix to Monday, Wednesday, and Friday. Will update medication list.

## 2022-05-10 NOTE — Progress Notes (Signed)
HEART & VASCULAR TRANSITION OF CARE CONSULT NOTE     Referring Physician: Primary Care: Primary Cardiologist:  HPI: Referred to clinic by *** for heart failure consultation.   Cardiac Testing    Review of Systems: [y] = yes, '[ ]'$  = no   General: Weight gain '[ ]'$ ; Weight loss '[ ]'$ ; Anorexia '[ ]'$ ; Fatigue '[ ]'$ ; Fever '[ ]'$ ; Chills '[ ]'$ ; Weakness '[ ]'$   Cardiac: Chest pain/pressure '[ ]'$ ; Resting SOB '[ ]'$ ; Exertional SOB '[ ]'$ ; Orthopnea '[ ]'$ ; Pedal Edema '[ ]'$ ; Palpitations '[ ]'$ ; Syncope '[ ]'$ ; Presyncope '[ ]'$ ; Paroxysmal nocturnal dyspnea'[ ]'$   Pulmonary: Cough '[ ]'$ ; Wheezing'[ ]'$ ; Hemoptysis'[ ]'$ ; Sputum '[ ]'$ ; Snoring '[ ]'$   GI: Vomiting'[ ]'$ ; Dysphagia'[ ]'$ ; Melena'[ ]'$ ; Hematochezia '[ ]'$ ; Heartburn'[ ]'$ ; Abdominal pain '[ ]'$ ; Constipation '[ ]'$ ; Diarrhea '[ ]'$ ; BRBPR '[ ]'$   GU: Hematuria'[ ]'$ ; Dysuria '[ ]'$ ; Nocturia'[ ]'$   Vascular: Pain in legs with walking '[ ]'$ ; Pain in feet with lying flat '[ ]'$ ; Non-healing sores '[ ]'$ ; Stroke '[ ]'$ ; TIA '[ ]'$ ; Slurred speech '[ ]'$ ;  Neuro: Headaches'[ ]'$ ; Vertigo'[ ]'$ ; Seizures'[ ]'$ ; Paresthesias'[ ]'$ ;Blurred vision '[ ]'$ ; Diplopia '[ ]'$ ; Vision changes '[ ]'$   Ortho/Skin: Arthritis '[ ]'$ ; Joint pain '[ ]'$ ; Muscle pain '[ ]'$ ; Joint swelling '[ ]'$ ; Back Pain '[ ]'$ ; Rash '[ ]'$   Psych: Depression'[ ]'$ ; Anxiety'[ ]'$   Heme: Bleeding problems '[ ]'$ ; Clotting disorders '[ ]'$ ; Anemia '[ ]'$   Endocrine: Diabetes '[ ]'$ ; Thyroid dysfunction'[ ]'$    Past Medical History:  Diagnosis Date   Abnormal EKG    AICD (automatic cardioverter/defibrillator) present    Alcoholic intoxication without complication (HCC)    Allergy    Cardiomyopathy, ischemic    Cataract    Chest pain 08/29/2016   CHF (congestive heart failure) (HCC)    Chronic systolic heart failure ([place]) 08/29/2016   Facial laceration    Fall 07/07/2016   Hyperlipidemia    Hypertension 03/03/2017   Hyponatremia    Hypothyroidism 07/08/2016   NSTEMI (non-ST elevated myocardial infarction) (HCC)    Syncope    Thyroid disease     Current Outpatient Medications  Medication Sig Dispense  Refill   acetaminophen (TYLENOL) 500 MG tablet Take 1,000 mg by mouth every 6 (six) hours as needed for moderate pain.     alendronate (FOSAMAX) 70 MG tablet TAKE 1 TABLET(70 MG) BY MOUTH 1 TIME A WEEK WITH A FULL GLASS OF WATER AND ON AN EMPTY STOMACH (Patient taking differently: Take 70 mg by mouth once a week.) 12 tablet 1   atorvastatin (LIPITOR) 80 MG tablet TAKE 1 TABLET(80 MG) BY MOUTH DAILY (Patient taking differently: Take 80 mg by mouth daily.) 90 tablet 1   bisoprolol (ZEBETA) 5 MG tablet Take 1 tablet (5 mg total) by mouth daily. 30 tablet 0   Cholecalciferol (VITAMIN D3) 50 MCG (2000 UT) TABS Take 2,000 mcg by mouth daily.     clopidogrel (PLAVIX) 75 MG tablet TAKE 1 TABLET(75 MG) BY MOUTH DAILY WITH BREAKFAST (Patient taking differently: Take 75 mg by mouth daily.) 90 tablet 3   empagliflozin (JARDIANCE) 10 MG TABS tablet Take 1 tablet (10 mg total) by mouth daily. 30 tablet 0   furosemide (LASIX) 20 MG tablet Take 1 tablet (20 mg total) by mouth every Monday, Wednesday, and Friday. 39 tablet 3   levothyroxine (SYNTHROID) 25 MCG tablet Take 37.5 mcg by mouth daily before breakfast.     losartan (COZAAR) 25 MG tablet  Take 0.5 tablets (12.5 mg total) by mouth daily. 60 tablet 0   nitroGLYCERIN (NITROSTAT) 0.4 MG SL tablet Place 1 tablet (0.4 mg total) under the tongue every 5 (five) minutes x 3 doses as needed for chest pain. 75 tablet 3   ranolazine (RANEXA) 1000 MG SR tablet Take 1 tablet (1,000 mg total) by mouth 2 (two) times daily. 180 tablet 3   valACYclovir (VALTREX) 1000 MG tablet Take 1,000 mg by mouth daily.     No current facility-administered medications for this visit.    Allergies  Allergen Reactions   Lisinopril Cough    Dry cough   Spironolactone Other (See Comments)    Low sodium      Social History   Socioeconomic History   Marital status: Married    Spouse name: Comptroller   Number of children: 2   Years of education: Not on file   Highest education  level: High school graduate  Occupational History   Occupation: Retired-worked for post office  Tobacco Use   Smoking status: Former    Packs/day: 1.00    Years: 50.00    Total pack years: 50.00    Types: Cigarettes    Quit date: 07/07/2016    Years since quitting: 5.8   Smokeless tobacco: Never  Vaping Use   Vaping Use: Never used  Substance and Sexual Activity   Alcohol use: Not Currently   Drug use: Never   Sexual activity: Not Currently  Other Topics Concern   Not on file  Social History Narrative   Not on file   Social Determinants of Health   Financial Resource Strain: Low Risk  (04/29/2022)   Overall Financial Resource Strain (CARDIA)    Difficulty of Paying Living Expenses: Not hard at all  Food Insecurity: No Food Insecurity (04/29/2022)   Hunger Vital Sign    Worried About Running Out of Food in the Last Year: Never true    [place] in the Last Year: Never true  Transportation Needs: No Transportation Needs (04/29/2022)   PRAPARE - Hydrologist (Medical): No    Lack of Transportation (Non-Medical): No  Physical Activity: Not on file  Stress: Not on file  Social Connections: Not on file  Intimate Partner Violence: Not on file      Family History  Problem Relation Age of Onset   Kidney failure Mother    Heart disease Mother        CHF   Heart disease Sister    Colon cancer Neg Hx    Esophageal cancer Neg Hx    Rectal cancer Neg Hx    Stomach cancer Neg Hx     There were no vitals filed for this visit.  PHYSICAL EXAM: General:  Well appearing. No respiratory difficulty HEENT: normal Neck: supple. no JVD. Carotids 2+ bilat; no bruits. No lymphadenopathy or thryomegaly appreciated. Cor: PMI nondisplaced. Regular rate & rhythm. No rubs, gallops or murmurs. Lungs: clear Abdomen: soft, nontender, nondistended. No hepatosplenomegaly. No bruits or masses. Good bowel sounds. Extremities: no cyanosis, clubbing, rash,  edema Neuro: alert & oriented x 3, cranial nerves grossly intact. moves all 4 extremities w/o difficulty. Affect pleasant.  ECG:   ASSESSMENT & PLAN:  NYHA *** GDMT  Diuretic- BB- Ace/ARB/ARNI MRA SGLT2i    Referred to HFSW (PCP, Medications, Transportation, ETOH Abuse, Drug Abuse, Insurance, Financial ): Yes or No Refer to Pharmacy: Yes or No Refer to Home Health: Yes on No  Refer to Advanced Heart Failure Clinic: Yes or no  Refer to General Cardiology: Yes or No  Follow up

## 2022-05-11 ENCOUNTER — Encounter (HOSPITAL_COMMUNITY): Payer: Self-pay

## 2022-05-11 ENCOUNTER — Telehealth (HOSPITAL_COMMUNITY): Payer: Self-pay

## 2022-05-11 ENCOUNTER — Ambulatory Visit (HOSPITAL_COMMUNITY)
Admit: 2022-05-11 | Discharge: 2022-05-11 | Disposition: A | Discharge status: Home / Self Care | Payer: Federal, State, Local not specified - PPO | Source: Ambulatory Visit | Attending: Physician Assistant | Admitting: Physician Assistant

## 2022-05-11 ENCOUNTER — Encounter: Payer: Self-pay | Admitting: *Deleted

## 2022-05-11 ENCOUNTER — Telehealth (HOSPITAL_COMMUNITY): Payer: Self-pay | Admitting: *Deleted

## 2022-05-11 VITALS — BP 110/68 | HR 57 | Wt 117.6 lb

## 2022-05-11 DIAGNOSIS — Z79899 Other long term (current) drug therapy: Secondary | ICD-10-CM | POA: Diagnosis not present

## 2022-05-11 DIAGNOSIS — Z7984 Long term (current) use of oral hypoglycemic drugs: Secondary | ICD-10-CM | POA: Diagnosis not present

## 2022-05-11 DIAGNOSIS — Z006 Encounter for examination for normal comparison and control in clinical research program: Secondary | ICD-10-CM

## 2022-05-11 DIAGNOSIS — R0602 Shortness of breath: Secondary | ICD-10-CM

## 2022-05-11 DIAGNOSIS — I2510 Atherosclerotic heart disease of native coronary artery without angina pectoris: Secondary | ICD-10-CM | POA: Insufficient documentation

## 2022-05-11 DIAGNOSIS — J439 Emphysema, unspecified: Secondary | ICD-10-CM | POA: Insufficient documentation

## 2022-05-11 DIAGNOSIS — I5022 Chronic systolic (congestive) heart failure: Secondary | ICD-10-CM | POA: Diagnosis present

## 2022-05-11 DIAGNOSIS — Z87891 Personal history of nicotine dependence: Secondary | ICD-10-CM | POA: Insufficient documentation

## 2022-05-11 DIAGNOSIS — J189 Pneumonia, unspecified organism: Secondary | ICD-10-CM | POA: Diagnosis not present

## 2022-05-11 DIAGNOSIS — I255 Ischemic cardiomyopathy: Secondary | ICD-10-CM | POA: Diagnosis not present

## 2022-05-11 DIAGNOSIS — Z7901 Long term (current) use of anticoagulants: Secondary | ICD-10-CM | POA: Insufficient documentation

## 2022-05-11 LAB — BASIC METABOLIC PANEL
Anion gap: 6 (ref 5–15)
BUN: 14 mg/dL (ref 8–23)
CO2: 25 mmol/L (ref 22–32)
Calcium: 9.1 mg/dL (ref 8.9–10.3)
Chloride: 101 mmol/L (ref 98–111)
Creatinine, Ser: 1 mg/dL (ref 0.44–1.00)
GFR, Estimated: >60 mL/min (ref >60)
Glucose, Bld: 94 mg/dL (ref 70–99)
Potassium: 4.3 mmol/L (ref 3.5–5.1)
Sodium: 132 mmol/L — ABNORMAL LOW (ref 135–145)

## 2022-05-11 LAB — BRAIN NATRIURETIC PEPTIDE: B Natriuretic Peptide: 694 pg/mL — ABNORMAL HIGH (ref 0.0–100.0)

## 2022-05-11 MED ORDER — LOSARTAN POTASSIUM 25 MG PO TABS: 25.0000 mg | ORAL_TABLET | Freq: Every day | ORAL | 0 refills | Status: DC

## 2022-05-11 MED ORDER — SPIRONOLACTONE 25 MG PO TABS: 12.5000 mg | ORAL_TABLET | Freq: Every day | ORAL | 2 refills | Status: DC

## 2022-05-11 NOTE — Telephone Encounter (Signed)
Call attempted to confirm HV TOC appt 9 am on 05/11/22. HIPPA appropriate VM left with callback number.    Earnestine Leys, BSN, Clinical cytogeneticist Only

## 2022-05-11 NOTE — Telephone Encounter (Signed)
Patient called to report that she did not pick up the spiro due to her having a reaction to that medication. She wanted to know if you wanted her to start another medication.

## 2022-05-11 NOTE — Patient Instructions (Signed)
Good to see you today!  INCREASE Losartan to 25 mg (1 tablet) Daily  Start spironolactone 12.5 mg ( 1/2 tablet ) daily   Labs done today, your results will be available in MyChart, we will contact you for abnormal readings.  Lab work in 10 days  Your physician has recommended that you have a pulmonary function test. Pulmonary Function Tests are a group of tests that measure how well air moves in and out of your lungs.They will call  to schedule the appointment  Your physician recommends that you schedule a follow-up appointment in: 3 weeks  If you have any questions or concerns before your next appointment please send [place] a message through [facility] or call our office at [phone].    TO LEAVE A MESSAGE FOR THE NURSE SELECT OPTION 2, PLEASE LEAVE A MESSAGE INCLUDING: YOUR NAME DATE OF BIRTH CALL BACK NUMBER REASON FOR CALL**this is important as we prioritize the call backs  YOU WILL RECEIVE A CALL BACK THE SAME DAY AS LONG AS YOU CALL BEFORE 4:00 PM  At the [facility], you and your health needs are our priority. As part of our continuing mission to provide you with exceptional heart care, we have created designated Provider Care Teams. These Care Teams include your primary Cardiologist (physician) and Advanced Practice Providers (APPs- Physician Assistants and Nurse Practitioners) who all work together to provide you with the care you need, when you need it.   You may see any of the following providers on your designated Care Team at your next follow up: Dr Glori Bickers Dr Loralie Champagne Dr. Roxana Hires, NP Lyda Jester, [place] [facility] [place], [place] Forestine Na, NP Audry Riles, PharmD   Please be sure to bring in all your medications bottles to every appointment.

## 2022-05-12 ENCOUNTER — Encounter: Payer: Self-pay | Admitting: *Deleted

## 2022-05-12 DIAGNOSIS — Z006 Encounter for examination for normal comparison and control in clinical research program: Secondary | ICD-10-CM

## 2022-05-12 NOTE — Research (Signed)
Spoke with patient and husband about [employer] study. Explained study to patient. No questions at this time.  Gave brochures and ICF to review.  Patient will call if interested in study.   Philemon Kingdom :) RN BSN  Clinical Research Nurse  Be strong and take heart, all you who hope in the [place]. ~ Psalm 31:24

## 2022-05-12 NOTE — Research (Signed)
OPEN IN ERROR 

## 2022-05-18 ENCOUNTER — Ambulatory Visit (HOSPITAL_COMMUNITY)
Admission: RE | Admit: 2022-05-18 | Discharge: 2022-05-18 | Disposition: A | Discharge status: Home / Self Care | Payer: Federal, State, Local not specified - PPO | Source: Ambulatory Visit | Attending: Internal Medicine | Admitting: Internal Medicine

## 2022-05-18 DIAGNOSIS — I5022 Chronic systolic (congestive) heart failure: Secondary | ICD-10-CM

## 2022-05-18 LAB — BASIC METABOLIC PANEL
Anion gap: 6 (ref 5–15)
BUN: 12 mg/dL (ref 8–23)
CO2: 25 mmol/L (ref 22–32)
Calcium: 9.3 mg/dL (ref 8.9–10.3)
Chloride: 105 mmol/L (ref 98–111)
Creatinine, Ser: 1.05 mg/dL — ABNORMAL HIGH (ref 0.44–1.00)
GFR, Estimated: 57 mL/min — ABNORMAL LOW (ref >60)
Glucose, Bld: 82 mg/dL (ref 70–99)
Potassium: 4 mmol/L (ref 3.5–5.1)
Sodium: 136 mmol/L (ref 135–145)

## 2022-05-24 ENCOUNTER — Ambulatory Visit (INDEPENDENT_AMBULATORY_CARE_PROVIDER_SITE_OTHER): Payer: Federal, State, Local not specified - PPO

## 2022-05-24 DIAGNOSIS — I255 Ischemic cardiomyopathy: Secondary | ICD-10-CM | POA: Diagnosis not present

## 2022-05-27 LAB — CUP PACEART REMOTE DEVICE CHECK
Battery Remaining Longevity: 119 mo
Battery Voltage: 3 V
Brady Statistic RV Percent Paced: 0.01 %
Date Time Interrogation Session: 20231024112705
HighPow Impedance: 54 Ohm
Implantable Lead Connection Status: 753985
Implantable Lead Implant Date: 20210127
Implantable Lead Location: 753860
Implantable Pulse Generator Implant Date: 20210127
Lead Channel Impedance Value: 304 Ohm
Lead Channel Impedance Value: 342 Ohm
Lead Channel Pacing Threshold Amplitude: 0.625 V
Lead Channel Pacing Threshold Pulse Width: 0.4 ms
Lead Channel Sensing Intrinsic Amplitude: 8.375 mV
Lead Channel Sensing Intrinsic Amplitude: 8.375 mV
Lead Channel Setting Pacing Amplitude: 2 V
Lead Channel Setting Pacing Pulse Width: 0.4 ms
Lead Channel Setting Sensing Sensitivity: 0.3 mV
Zone Setting Status: 755011
Zone Setting Status: 755011

## 2022-05-30 ENCOUNTER — Ambulatory Visit (HOSPITAL_COMMUNITY)
Admission: RE | Admit: 2022-05-30 | Discharge: 2022-05-30 | Disposition: A | Discharge status: Home / Self Care | Payer: Federal, State, Local not specified - PPO | Source: Ambulatory Visit | Attending: Physician Assistant | Admitting: Physician Assistant

## 2022-05-30 DIAGNOSIS — R0602 Shortness of breath: Secondary | ICD-10-CM | POA: Insufficient documentation

## 2022-05-30 LAB — PULMONARY FUNCTION TEST
DL/VA % pred: 42 %
DL/VA: 1.75 ml/min/mmHg/L
DLCO unc % pred: 42 %
DLCO unc: 8.01 ml/min/mmHg
FEF 25-75 Post: 0.9 L/sec
FEF 25-75 Pre: 0.64 L/sec
FEF2575-%Change-Post: 40 %
FEF2575-%Pred-Post: 49 %
FEF2575-%Pred-Pre: 34 %
FEV1-%Change-Post: 13 %
FEV1-%Pred-Post: 81 %
FEV1-%Pred-Pre: 71 %
FEV1-Post: 1.77 L
FEV1-Pre: 1.56 L
FEV1FVC-%Change-Post: 0 %
FEV1FVC-%Pred-Pre: 71 %
FEV6-%Change-Post: 9 %
FEV6-%Pred-Post: 105 %
FEV6-%Pred-Pre: 96 %
FEV6-Post: 2.89 L
FEV6-Pre: 2.65 L
FEV6FVC-%Change-Post: -5 %
FEV6FVC-%Pred-Post: 92 %
FEV6FVC-%Pred-Pre: 97 %
FVC-%Change-Post: 14 %
FVC-%Pred-Post: 114 %
FVC-%Pred-Pre: 100 %
FVC-Post: 3.29 L
FVC-Pre: 2.88 L
Post FEV1/FVC ratio: 54 %
Post FEV6/FVC ratio: 88 %
Pre FEV1/FVC ratio: 54 %
Pre FEV6/FVC Ratio: 93 %
RV % pred: 140 %
RV: 3 L
TLC % pred: 124 %
TLC: 6.08 L

## 2022-05-30 MED ORDER — ALBUTEROL SULFATE (2.5 MG/3ML) 0.083% IN NEBU
2.5000 mg | INHALATION_SOLUTION | Freq: Once | RESPIRATORY_TRACT | Status: AC
  Administered 2022-05-30: 2.5 mg via RESPIRATORY_TRACT

## 2022-05-31 ENCOUNTER — Encounter (HOSPITAL_COMMUNITY): Payer: Self-pay | Admitting: Cardiology

## 2022-05-31 ENCOUNTER — Ambulatory Visit (HOSPITAL_COMMUNITY)
Admission: RE | Admit: 2022-05-31 | Discharge: 2022-05-31 | Disposition: A | Discharge status: Home / Self Care | Payer: Federal, State, Local not specified - PPO | Source: Ambulatory Visit | Attending: Cardiology | Admitting: Cardiology

## 2022-05-31 ENCOUNTER — Other Ambulatory Visit (HOSPITAL_COMMUNITY): Payer: Self-pay

## 2022-05-31 VITALS — BP 120/60 | HR 61 | Wt 121.8 lb

## 2022-05-31 DIAGNOSIS — I25119 Atherosclerotic heart disease of native coronary artery with unspecified angina pectoris: Secondary | ICD-10-CM | POA: Diagnosis not present

## 2022-05-31 DIAGNOSIS — J439 Emphysema, unspecified: Secondary | ICD-10-CM | POA: Insufficient documentation

## 2022-05-31 DIAGNOSIS — Z955 Presence of coronary angioplasty implant and graft: Secondary | ICD-10-CM | POA: Insufficient documentation

## 2022-05-31 DIAGNOSIS — Z79899 Other long term (current) drug therapy: Secondary | ICD-10-CM | POA: Insufficient documentation

## 2022-05-31 DIAGNOSIS — Z7984 Long term (current) use of oral hypoglycemic drugs: Secondary | ICD-10-CM | POA: Insufficient documentation

## 2022-05-31 DIAGNOSIS — Z7902 Long term (current) use of antithrombotics/antiplatelets: Secondary | ICD-10-CM | POA: Insufficient documentation

## 2022-05-31 DIAGNOSIS — I255 Ischemic cardiomyopathy: Secondary | ICD-10-CM | POA: Insufficient documentation

## 2022-05-31 DIAGNOSIS — I2510 Atherosclerotic heart disease of native coronary artery without angina pectoris: Secondary | ICD-10-CM | POA: Diagnosis not present

## 2022-05-31 DIAGNOSIS — I5022 Chronic systolic (congestive) heart failure: Secondary | ICD-10-CM | POA: Insufficient documentation

## 2022-05-31 DIAGNOSIS — Z95810 Presence of automatic (implantable) cardiac defibrillator: Secondary | ICD-10-CM | POA: Insufficient documentation

## 2022-05-31 LAB — COMPREHENSIVE METABOLIC PANEL
ALT: 19 U/L (ref 0–44)
AST: 20 U/L (ref 15–41)
Albumin: 3.7 g/dL (ref 3.5–5.0)
Alkaline Phosphatase: 39 U/L (ref 38–126)
Anion gap: 12 (ref 5–15)
BUN: 22 mg/dL (ref 8–23)
CO2: 25 mmol/L (ref 22–32)
Calcium: 9.3 mg/dL (ref 8.9–10.3)
Chloride: 97 mmol/L — ABNORMAL LOW (ref 98–111)
Creatinine, Ser: 0.99 mg/dL (ref 0.44–1.00)
GFR, Estimated: >60 mL/min (ref >60)
Glucose, Bld: 94 mg/dL (ref 70–99)
Potassium: 3.9 mmol/L (ref 3.5–5.1)
Sodium: 134 mmol/L — ABNORMAL LOW (ref 135–145)
Total Bilirubin: 0.3 mg/dL (ref 0.3–1.2)
Total Protein: 6.2 g/dL — ABNORMAL LOW (ref 6.5–8.1)

## 2022-05-31 LAB — BRAIN NATRIURETIC PEPTIDE: B Natriuretic Peptide: 916.2 pg/mL — ABNORMAL HIGH (ref 0.0–100.0)

## 2022-05-31 MED ORDER — BISOPROLOL FUMARATE 5 MG PO TABS: 5.0000 mg | ORAL_TABLET | Freq: Every day | ORAL | 11 refills | Status: DC

## 2022-05-31 MED ORDER — ENTRESTO 24-26 MG PO TABS: 1.0000 | ORAL_TABLET | Freq: Two times a day (BID) | ORAL | 11 refills | Status: DC

## 2022-05-31 MED ORDER — EMPAGLIFLOZIN 10 MG PO TABS: 10.0000 mg | ORAL_TABLET | Freq: Every day | ORAL | 11 refills | Status: DC

## 2022-05-31 NOTE — Patient Instructions (Addendum)
TAKE 40 mg Lasix for 3 days then go back to 20 mg daily  START Entresto 24/26 mg Twice daily  STOP Losartan  Labs done today, your results will be available in MyChart, we will contact you for abnormal readings.   Your physician has recommended that you have a cardiopulmonary stress test (CPX). CPX testing is a non-invasive measurement of heart and lung function. It replaces a traditional treadmill stress test. This type of test provides a tremendous amount of information that relates not only to your present condition but also for future outcomes. This test combines measurements of you ventilation, respiratory gas exchange in the lungs, electrocardiogram (EKG), blood pressure and physical response before, during, and following an exercise protocol. YOU WILL BE CALLED TO HAVE THE TEST SCHEDULED.  You have been referred to Pulmonology. They will call you to arrange your appointment.  Please follow up with our heart failure pharmacist in 3 weeks  Your physician recommends that you schedule a follow-up appointment in: 2 months  If you have any questions or concerns before your next appointment please send [place] a message through [place] or call our office at [phone].    TO LEAVE A MESSAGE FOR THE NURSE SELECT OPTION 2, PLEASE LEAVE A MESSAGE INCLUDING: YOUR NAME DATE OF BIRTH CALL BACK NUMBER REASON FOR CALL**this is important as we prioritize the call backs  YOU WILL RECEIVE A CALL BACK THE SAME DAY AS LONG AS YOU CALL BEFORE 4:00 PM  At the [facility], you and your health needs are our priority. As part of our continuing mission to provide you with exceptional heart care, we have created designated Provider Care Teams. These Care Teams include your primary Cardiologist (physician) and Advanced Practice Providers (APPs- Physician Assistants and Nurse Practitioners) who all work together to provide you with the care you need, when you need it.   You may see any of the  following providers on your designated Care Team at your next follow up: Dr Glori Bickers Dr Loralie Champagne Dr. Roxana Hires, NP Lyda Jester, [place] [facility] [place], [place] Forestine Na, NP Audry Riles, PharmD   Please be sure to bring in all your medications bottles to every appointment.

## 2022-05-31 NOTE — Progress Notes (Signed)
ADVANCED HEART FAILURE CLINIC NOTE  Referring Physician: Loraine Leriche.,*  Primary Care: Loraine Leriche., MD Primary Cardiologist: Dr. Angelena Form, Dr. Johnsie Cancel  HPI: Pam [name] is a 71 y.o. female with chronic systolic heart failure secondary to ischemic cardiomyopathy status post ICD, coronary artery disease with CTO of the mid LAD filled with left to left and right to left collaterals presenting today to establish care.  Her cardiac history dates back to December 2017 when she presented with an NSTEMI.  At that time echocardiogram demonstrated LVEF of 35 to 40% with preserved RV function.  Coronary angiography with heavily calcified CTO of the proximal to mid LAD filled by collaterals and calcified 80% stenosis of the circumflex/OM1.  Follow-up cardiac MRI demonstrated nonviable LAD territory.  She ultimately underwent atherectomy and IVUS guided PCI of the proximal circumflex/OM1.  In November 2020 she had a reduction in her LVEF to 25 to 30% and underwent Medtronic ICD placement in early 2021.  According to Pam [name] she did fairly well for a few months however over the past several months has once again had some progression of dyspnea and a decrease in exercise capacity.  She was admitted in September 2023 with acute on chronic CHF exacerbation.  TTE and left heart cath from admission were stable compared to prior.  She was continued on medical therapy for CAD and started on low-dose GDMT for systolic heart failure.  Since that time she has been seen in [facility] clinic where GDMT was further uptitrated.  She also underwent PFTs that were significant for reversible obstructive lung disease and emphysema.  From a functional standpoint, Pam [name] is likely NYHA IIb-III; some reported PND; can perform ADLs without difficulty.  No reported chest pressure since her recent admission.  No lightheadedness, syncope, recent illness.  Activity level/exercise tolerance: NYHA IIb-III Orthopnea:  Sleeps  on 2 pillows Paroxysmal noctural dyspnea: Yes but infrequent Chest pain/pressure: Stable at this time Orthostatic lightheadedness: No Palpitations: No Lower extremity edema: Yes, improving Presyncope/syncope: No Cough: No  Past Medical History:  Diagnosis Date   Abnormal EKG    AICD (automatic cardioverter/defibrillator) present    Alcoholic intoxication without complication ([place])    Allergy    Cardiomyopathy, ischemic    Cataract    Chest pain 08/29/2016   CHF (congestive heart failure) ([place])    Chronic systolic heart failure ([place]) 08/29/2016   Facial laceration    Fall 07/07/2016   Hyperlipidemia    Hypertension 03/03/2017   Hyponatremia    Hypothyroidism 07/08/2016   NSTEMI (non-ST elevated myocardial infarction) ([place])    Syncope    Thyroid disease     Current Outpatient Medications  Medication Sig Dispense Refill   acetaminophen (TYLENOL) 500 MG tablet Take 1,000 mg by mouth every 6 (six) hours as needed for moderate pain.     alendronate (FOSAMAX) 70 MG tablet TAKE 1 TABLET(70 MG) BY MOUTH 1 TIME A WEEK WITH A FULL GLASS OF WATER AND ON AN EMPTY STOMACH 12 tablet 1   atorvastatin (LIPITOR) 80 MG tablet TAKE 1 TABLET(80 MG) BY MOUTH DAILY 90 tablet 1   Cholecalciferol (VITAMIN D3) 50 MCG (2000 UT) TABS Take 2,000 mcg by mouth daily.     clopidogrel (PLAVIX) 75 MG tablet TAKE 1 TABLET(75 MG) BY MOUTH DAILY WITH BREAKFAST 90 tablet 3   furosemide (LASIX) 20 MG tablet Take 20 mg by mouth daily.     levothyroxine (SYNTHROID) 25 MCG tablet Take 37.5 mcg by mouth daily before  breakfast.     nitroGLYCERIN (NITROSTAT) 0.4 MG SL tablet Place 1 tablet (0.4 mg total) under the tongue every 5 (five) minutes x 3 doses as needed for chest pain. 75 tablet 3   ranolazine (RANEXA) 1000 MG SR tablet Take 1 tablet (1,000 mg total) by mouth 2 (two) times daily. 180 tablet 3   sacubitril-valsartan (ENTRESTO) 24-26 MG Take 1 tablet by mouth 2 (two) times daily. 60 tablet 11   valACYclovir  (VALTREX) 1000 MG tablet Take 1,000 mg by mouth daily.     bisoprolol (ZEBETA) 5 MG tablet Take 1 tablet (5 mg total) by mouth daily. 30 tablet 11   empagliflozin (JARDIANCE) 10 MG TABS tablet Take 1 tablet (10 mg total) by mouth daily. 30 tablet 11   spironolactone (ALDACTONE) 25 MG tablet Take 0.5 tablets (12.5 mg total) by mouth daily. (Patient not taking: Reported on 05/31/2022) 15 tablet 2   No current facility-administered medications for this encounter.    Allergies  Allergen Reactions   Lisinopril Cough    Dry cough   Spironolactone Other (See Comments)    Low sodium      Social History   Socioeconomic History   Marital status: Married    Spouse name: Comptroller   Number of children: 2   Years of education: Not on file   Highest education level: High school graduate  Occupational History   Occupation: Retired-worked for post office  Tobacco Use   Smoking status: Former    Packs/day: 1.00    Years: 50.00    Total pack years: 50.00    Types: Cigarettes    Quit date: 07/07/2016    Years since quitting: 5.9   Smokeless tobacco: Never  Vaping Use   Vaping Use: Never used  Substance and Sexual Activity   Alcohol use: Not Currently   Drug use: Never   Sexual activity: Not Currently  Other Topics Concern   Not on file  Social History Narrative   Not on file   Social Determinants of Health   Financial Resource Strain: Low Risk  (04/29/2022)   Overall Financial Resource Strain (CARDIA)    Difficulty of Paying Living Expenses: Not hard at all  Food Insecurity: No Food Insecurity (04/29/2022)   Hunger Vital Sign    Worried About Running Out of Food in the Last Year: Never true    [place] in the Last Year: Never true  Transportation Needs: No Transportation Needs (04/29/2022)   PRAPARE - Hydrologist (Medical): No    Lack of Transportation (Non-Medical): No  Physical Activity: Not on file  Stress: Not on file  Social Connections:  Not on file  Intimate Partner Violence: Not on file      Family History  Problem Relation Age of Onset   Kidney failure Mother    Heart disease Mother        CHF   Heart disease Sister    Colon cancer Neg Hx    Esophageal cancer Neg Hx    Rectal cancer Neg Hx    Stomach cancer Neg Hx     PHYSICAL EXAM: Vitals:   05/31/22 1027  BP: 120/60  Pulse: 61  SpO2: 100%   GENERAL: Well nourished, well developed, and in no apparent distress at rest.  HEENT: Negative for arcus senilis or xanthelasma. There is no scleral icterus.  The mucous membranes are pink and moist.   NECK: Supple, No masses. Normal carotid upstrokes without  bruits. No masses or thyromegaly.    CHEST: There are no chest wall deformities. There is no chest wall tenderness. Respirations are unlabored.  Lungs-CTA bilaterally with very mild expiratory wheezing at the bases CARDIAC:  JVP: 9-10 cm H2O         Normal S1, S2  Normal rate with regular rhythm. No murmurs, rubs or gallops.  Pulses are 2+ and symmetrical in upper and lower extremities.  2+ edema.  ABDOMEN: Soft, non-tender, non-distended. There are no masses or hepatomegaly. There are normal bowel sounds.  EXTREMITIES: Warm and well perfused with no cyanosis, clubbing.  LYMPHATIC: No axillary or supraclavicular lymphadenopathy.  NEUROLOGIC: Patient is oriented x3 with no focal or lateralizing neurologic deficits.  PSYCH: Patients affect is appropriate, there is no evidence of anxiety or depression.  SKIN: Warm and dry; no lesions or wounds.   DATA REVIEW  ECG: Normal sinus rhythm  ECHO: 04/27/22: LVEF 25%-30%, RV function normal.  06/19/19: LVEF 25%-30%, Normal RV function.   CMR:  12/17: . Normal size and thickness of the left ventricle. Systolic function is moderately decreased (LVEF = 38%).  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 There is akinesis of the apical lateral, mid anterior, anteroseptal  walls.  The LAD territory is non-viable, RCA and LCX territory is viable.  2. Normal right ventricular size, thickness and systolic function (RVEF = 54%) with no regional wall motion abnormalities.  3.  Normal biatrial size.  4.  Mild mitral and mild to moderate tricuspid regurgitation.  5. Normal size of the aortic root, ascending aorta and pulmonary artery.  6.  Normal pericardium, trivial pericardial effusion.   CATH: 04/29/22:  Prox LAD to Mid LAD lesion is 90% stenosed with 95% stenosed side branch in 1st Sept. Mid LAD to Dist LAD lesion is 100% stenosed.   Previously placed Prox Cx to Dist Cx stent of unknown type is  widely patent with 0% stenosed side branch in 1st Mrg.   Lat 1st Mrg lesion is 50% stenosed.   LV end diastolic pressure is moderately elevated.   There is no aortic valve stenosis.   ASSESSMENT & PLAN:  NYHA IIB-III, Stage C, Systolic heart failrue Etiology of HF: Secondary to ischemic cardiomyopathy with nonviable myocardium as reported above NYHA class / AHA Stage: 2B to 3, difficult to accurately get an assessment of Ms. Faye's functional status. Volume status & Diuretics: Mildly hypervolemic on exam today with elevated BNP, will increase Lasix to 40 mg for the next 3 days.  Follow-up with pharmacy with repeat labs Vasodilators: Stop losartan, start Entresto 24/26.  Patient is unsure but may have had dry cough with ACE inhibitors.  We will try Entresto for a few weeks to see if the symptoms recur.  If so will restart higher dose losartan. Beta-Blocker: Continue bisoprolol MRA: Continue spironolactone Cardiometabolic: Jardiance 10 mg daily Devices therapies & Valvulopathies: Primary prevention ICD in place Advanced therapies: Scheduling CPX to obtain further evaluation of future prognosis and a more objective assessment of functional status.  At this time we will continue to uptitrate medical therapy, possibly distant future LVAD candidate.  2.  COPD -PFTs and  CT consistent with emphysema and obstructive lung disease.  Will place referral for pulmonology.  3.  Coronary artery disease -Management as per primary cardiology  Cerrone Debold Advanced Heart Failure Mechanical Circulatory Support

## 2022-06-01 ENCOUNTER — Other Ambulatory Visit (HOSPITAL_COMMUNITY): Payer: Self-pay | Admitting: *Deleted

## 2022-06-01 MED ORDER — SPIRONOLACTONE 25 MG PO TABS: 12.5000 mg | ORAL_TABLET | Freq: Every day | ORAL | 2 refills | Status: DC

## 2022-06-03 ENCOUNTER — Telehealth (HOSPITAL_COMMUNITY): Payer: Self-pay | Admitting: *Deleted

## 2022-06-03 NOTE — Telephone Encounter (Signed)
Pt called to verify lasix dose. Pt aware to take '20mg'$  daily.

## 2022-06-13 NOTE — Progress Notes (Signed)
Remote ICD transmission.   

## 2022-06-21 ENCOUNTER — Ambulatory Visit: Payer: Federal, State, Local not specified - PPO | Admitting: Pulmonary Disease

## 2022-06-21 ENCOUNTER — Encounter: Payer: Self-pay | Admitting: Pulmonary Disease

## 2022-06-21 VITALS — BP 124/70 | HR 62 | Ht 63.0 in | Wt 116.2 lb

## 2022-06-21 DIAGNOSIS — J432 Centrilobular emphysema: Secondary | ICD-10-CM | POA: Diagnosis not present

## 2022-06-21 DIAGNOSIS — Z87891 Personal history of nicotine dependence: Secondary | ICD-10-CM

## 2022-06-21 MED ORDER — ANORO ELLIPTA 62.5-25 MCG/ACT IN AEPB: 1.0000 | INHALATION_SPRAY | Freq: Every day | RESPIRATORY_TRACT | 5 refills | Status: DC

## 2022-06-21 MED ORDER — ALBUTEROL SULFATE HFA 108 (90 BASE) MCG/ACT IN AERS: 2.0000 | INHALATION_SPRAY | Freq: Four times a day (QID) | RESPIRATORY_TRACT | 6 refills | Status: DC | PRN

## 2022-06-21 NOTE — Progress Notes (Signed)
Synopsis: Referred in November 2023 for COPD/Emphysema by Hebert Soho, DO  Subjective:   PATIENT ID: Pam [name] GENDER: female DOB: 1951/02/01, MRN: [id number]   HPI  Chief Complaint  Patient presents with   Consult    Referred by cardiology for history of CHF and DOE.    Pam [name] is a 71 year old woman, former smoker with HFrEF, coronary artery disease, hypertension and hypothyroidism who is referred to pulmonary clinic for COPD/emphysema.   PFTs 05/23/22 showed mild obstructive defect with significant bronchodilator response, air trapping, and moderate diffusion defect.   CTA Chest 04/26/22 showed severe emphysematous changes, diffuse interlobular septal thickening greatest in the lower lobes, bronchial wall thickening in the lower lobes.   She does have exertional dyspnea which she needs to take breaks. No wheezing. Some coughing. Some phlegm production. She does report orthopnea which is better. She denies snoring. No witnessed apnea events per husband.  She was admitted for respiratory failure 9/27 to 10/2 due to pneumonia and heart failure.   She quit smoking 7 years ago. She smoked for 20-30 years, pack per day. She did have second hand smoke exposure in childhood. She is retired, she is a former Programme researcher, broadcasting/film/video. She lives with her husband who smokes.    Past Medical History:  Diagnosis Date   Abnormal EKG    AICD (automatic cardioverter/defibrillator) present    Alcoholic intoxication without complication (HCC)    Allergy    Cardiomyopathy, ischemic    Cataract    Chest pain 08/29/2016   CHF (congestive heart failure) (HCC)    Chronic systolic heart failure ([place]) 08/29/2016   Facial laceration    Fall 07/07/2016   Hyperlipidemia    Hypertension 03/03/2017   Hyponatremia    Hypothyroidism 07/08/2016   NSTEMI (non-ST elevated myocardial infarction) ([place])    Syncope    Thyroid disease      Family History  Problem Relation Age of Onset   Kidney  failure Mother    Heart disease Mother        CHF   Heart disease Sister    Colon cancer Neg Hx    Esophageal cancer Neg Hx    Rectal cancer Neg Hx    Stomach cancer Neg Hx      Social History   Socioeconomic History   Marital status: Married    Spouse name: Comptroller   Number of children: 2   Years of education: Not on file   Highest education level: High school graduate  Occupational History   Occupation: Retired-worked for post office  Tobacco Use   Smoking status: Former    Packs/day: 1.00    Years: 50.00    Total pack years: 50.00    Types: Cigarettes    Quit date: 07/07/2016    Years since quitting: 5.9   Smokeless tobacco: Never  Vaping Use   Vaping Use: Never used  Substance and Sexual Activity   Alcohol use: Not Currently   Drug use: Never   Sexual activity: Not Currently  Other Topics Concern   Not on file  Social History Narrative   Not on file   Social Determinants of Health   Financial Resource Strain: Low Risk  (04/29/2022)   Overall Financial Resource Strain (CARDIA)    Difficulty of Paying Living Expenses: Not hard at all  Food Insecurity: No Food Insecurity (04/29/2022)   Hunger Vital Sign    Worried About Running Out of Food in the Last Year: Never true  Ran Out of Food in the Last Year: Never true  Transportation Needs: No Transportation Needs (04/29/2022)   PRAPARE - Hydrologist (Medical): No    Lack of Transportation (Non-Medical): No  Physical Activity: Not on file  Stress: Not on file  Social Connections: Not on file  Intimate Partner Violence: Not on file     Allergies  Allergen Reactions   Lisinopril Cough    Dry cough     Outpatient Medications Prior to Visit  Medication Sig Dispense Refill   acetaminophen (TYLENOL) 500 MG tablet Take 1,000 mg by mouth every 6 (six) hours as needed for moderate pain.     alendronate (FOSAMAX) 70 MG tablet TAKE 1 TABLET(70 MG) BY MOUTH 1 TIME A WEEK WITH A FULL  GLASS OF WATER AND ON AN EMPTY STOMACH 12 tablet 1   atorvastatin (LIPITOR) 80 MG tablet TAKE 1 TABLET(80 MG) BY MOUTH DAILY 90 tablet 1   bisoprolol (ZEBETA) 5 MG tablet Take 1 tablet (5 mg total) by mouth daily. 30 tablet 11   Cholecalciferol (VITAMIN D3) 50 MCG (2000 UT) TABS Take 2,000 mcg by mouth daily.     clopidogrel (PLAVIX) 75 MG tablet TAKE 1 TABLET(75 MG) BY MOUTH DAILY WITH BREAKFAST 90 tablet 3   empagliflozin (JARDIANCE) 10 MG TABS tablet Take 1 tablet (10 mg total) by mouth daily. 30 tablet 11   furosemide (LASIX) 20 MG tablet Take 20 mg by mouth daily.     levothyroxine (SYNTHROID) 25 MCG tablet Take 37.5 mcg by mouth daily before breakfast.     nitroGLYCERIN (NITROSTAT) 0.4 MG SL tablet Place 1 tablet (0.4 mg total) under the tongue every 5 (five) minutes x 3 doses as needed for chest pain. 75 tablet 3   ranolazine (RANEXA) 1000 MG SR tablet Take 1 tablet (1,000 mg total) by mouth 2 (two) times daily. 180 tablet 3   sacubitril-valsartan (ENTRESTO) 24-26 MG Take 1 tablet by mouth 2 (two) times daily. 60 tablet 11   spironolactone (ALDACTONE) 25 MG tablet Take 0.5 tablets (12.5 mg total) by mouth daily. 15 tablet 2   valACYclovir (VALTREX) 1000 MG tablet Take 1,000 mg by mouth daily.     No facility-administered medications prior to visit.   Review of Systems  Constitutional:  Negative for chills, fever, malaise/fatigue and weight loss.  HENT:  Negative for congestion, sinus pain and sore throat.   Eyes: Negative.   Respiratory:  Positive for cough, sputum production and shortness of breath. Negative for hemoptysis and wheezing.   Cardiovascular:  Positive for orthopnea. Negative for chest pain, palpitations, claudication and leg swelling.  Gastrointestinal:  Negative for abdominal pain, heartburn, nausea and vomiting.  Genitourinary: Negative.   Musculoskeletal:  Negative for joint pain and myalgias.  Skin:  Negative for rash.  Neurological:  Negative for weakness.   Endo/Heme/Allergies: Negative.   Psychiatric/Behavioral: Negative.     Objective:   Vitals:   06/21/22 0848  BP: 124/70  Pulse: 62  SpO2: 98%  Weight: 116 lb 3.2 oz (52.7 kg)  Height: '5\' 3"'$  (1.6 m)   Physical Exam Constitutional:      General: She is not in acute distress.    Appearance: She is not ill-appearing.  HENT:     Head: Normocephalic and atraumatic.  Eyes:     General: No scleral icterus.    Conjunctiva/sclera: Conjunctivae normal.     Pupils: Pupils are equal, round, and reactive to light.  Cardiovascular:  Rate and Rhythm: Normal rate and regular rhythm.     Pulses: Normal pulses.     Heart sounds: Normal heart sounds. No murmur heard. Pulmonary:     Effort: Pulmonary effort is normal.     Breath sounds: Normal breath sounds. No wheezing, rhonchi or rales.  Abdominal:     General: Bowel sounds are normal.     Palpations: Abdomen is soft.  Musculoskeletal:     Right lower leg: No edema.     Left lower leg: No edema.  Lymphadenopathy:     Cervical: No cervical adenopathy.  Skin:    General: Skin is warm and dry.  Neurological:     General: No focal deficit present.     Mental Status: She is alert.  Psychiatric:        Mood and Affect: Mood normal.        Behavior: Behavior normal.        Thought Content: Thought content normal.        Judgment: Judgment normal.    CBC    Component Value Date/Time   WBC 16.9 (H) 05/02/2022 0006   RBC 2.46 (L) 05/02/2022 0006   HGB 8.8 (L) 05/02/2022 0006   HGB 13.6 08/19/2019 0830   HCT 24.4 (L) 05/02/2022 0006   HCT 39.0 08/19/2019 0830   PLT 494 (H) 05/02/2022 0006   PLT 333 08/19/2019 0830   MCV 99.2 05/02/2022 0006   MCV 88 08/19/2019 0830   MCH 35.8 (H) 05/02/2022 0006   MCHC 36.1 (H) 05/02/2022 0006   RDW 14.3 05/02/2022 0006   RDW 14.1 08/19/2019 0830   LYMPHSABS 2.6 04/26/2022 1555   LYMPHSABS 2.9 08/25/2016 1648   MONOABS 1.3 (H) 04/26/2022 1555   EOSABS 0.1 04/26/2022 1555   EOSABS 0.2  08/25/2016 1648   BASOSABS 0.1 04/26/2022 1555   BASOSABS 0.0 08/25/2016 1648      Latest Ref Rng & Units 05/31/2022   11:08 AM 05/18/2022    9:32 AM 05/11/2022    9:22 AM  BMP  Glucose 70 - 99 mg/dL 94  82  94   BUN 8 - 23 mg/dL '22  12  14   '$ Creatinine 0.44 - 1.00 mg/dL 0.99  1.05  1.00   Sodium 135 - 145 mmol/L 134  136  132   Potassium 3.5 - 5.1 mmol/L 3.9  4.0  4.3   Chloride 98 - 111 mmol/L 97  105  101   CO2 22 - 32 mmol/L '25  25  25   '$ Calcium 8.9 - 10.3 mg/dL 9.3  9.3  9.1    Chest imaging: CTA Chest 04/23/22 1. No acute pulmonary embolism. 2. Interlobular septal thickening with more confluent opacities in the lower lungs. This is favored to represent infection superimposed on a background of advanced emphysema though pulmonary edema could be considered. 3. Aortic Atherosclerosis (ICD10-I70.0) and Emphysema (ICD10-J43.9). 4. Coronary artery disease/stenting. 5. Small pericardial effusion.  PFT:    Latest Ref Rng & Units 05/30/2022    8:37 AM  PFT Results  FVC-Pre L 2.88   FVC-Predicted Pre % 100   FVC-Post L 3.29   FVC-Predicted Post % 114   Pre FEV1/FVC % % 54   Post FEV1/FCV % % 54   FEV1-Pre L 1.56   FEV1-Predicted Pre % 71   FEV1-Post L 1.77   DLCO uncorrected ml/min/mmHg 8.01   DLCO UNC% % 42   DLVA Predicted % 42   TLC L 6.08  TLC % Predicted % 124   RV % Predicted % 140    Labs:  Path:  Echo 04/27/22: LV EF 25-30%. Grade II diastolic dysfunction. RV systolic function is normal. RV size is normal. Mildly elevated PA pressure.   Heart Catheterization:  Assessment & Plan:   Former smoker  Centrilobular emphysema ([place]) - Plan: umeclidinium-vilanterol (ANORO ELLIPTA) 62.5-25 MCG/ACT AEPB, albuterol (VENTOLIN HFA) 108 (90 Base) MCG/ACT inhaler, Ambulatory Referral for Lung Cancer Scre  Discussion: Pam [name] is a 71 year old woman, former smoker with HFrEF, coronary artery disease, hypertension and hypothyroidism who is referred to pulmonary  clinic for COPD/emphysema.   She has significant emphysema noted on CT Chest imaging and mild osbtructive defect on PFTs with significant bronchodilator response, evidence of air trapping and moderate diffusion defect.   We will start her on anoro ellipta 1 puff daily and as needed albuterol inhaler. Inhaler education provided today for ellipta and HFA inhalers.   She is to continue lung cancer screening with our office and referral has been placed, she will be due for follow up L[facility] CT Chest in May 2024.  Follow up in 3 months.   Freda Jackson, MD [facility] Office: [phone]   Current Outpatient Medications:    acetaminophen (TYLENOL) 500 MG tablet, Take 1,000 mg by mouth every 6 (six) hours as needed for moderate pain., Disp: , Rfl:    albuterol (VENTOLIN HFA) 108 (90 Base) MCG/ACT inhaler, Inhale 2 puffs into the lungs every 6 (six) hours as needed for wheezing or shortness of breath., Disp: 8 g, Rfl: 6   alendronate (FOSAMAX) 70 MG tablet, TAKE 1 TABLET(70 MG) BY MOUTH 1 TIME A WEEK WITH A FULL GLASS OF WATER AND ON AN EMPTY STOMACH, Disp: 12 tablet, Rfl: 1   atorvastatin (LIPITOR) 80 MG tablet, TAKE 1 TABLET(80 MG) BY MOUTH DAILY, Disp: 90 tablet, Rfl: 1   bisoprolol (ZEBETA) 5 MG tablet, Take 1 tablet (5 mg total) by mouth daily., Disp: 30 tablet, Rfl: 11   Cholecalciferol (VITAMIN D3) 50 MCG (2000 UT) TABS, Take 2,000 mcg by mouth daily., Disp: , Rfl:    clopidogrel (PLAVIX) 75 MG tablet, TAKE 1 TABLET(75 MG) BY MOUTH DAILY WITH BREAKFAST, Disp: 90 tablet, Rfl: 3   empagliflozin (JARDIANCE) 10 MG TABS tablet, Take 1 tablet (10 mg total) by mouth daily., Disp: 30 tablet, Rfl: 11   furosemide (LASIX) 20 MG tablet, Take 20 mg by mouth daily., Disp: , Rfl:    levothyroxine (SYNTHROID) 25 MCG tablet, Take 37.5 mcg by mouth daily before breakfast., Disp: , Rfl:    nitroGLYCERIN (NITROSTAT) 0.4 MG SL tablet, Place 1 tablet (0.4 mg total) under the tongue every 5  (five) minutes x 3 doses as needed for chest pain., Disp: 75 tablet, Rfl: 3   ranolazine (RANEXA) 1000 MG SR tablet, Take 1 tablet (1,000 mg total) by mouth 2 (two) times daily., Disp: 180 tablet, Rfl: 3   sacubitril-valsartan (ENTRESTO) 24-26 MG, Take 1 tablet by mouth 2 (two) times daily., Disp: 60 tablet, Rfl: 11   spironolactone (ALDACTONE) 25 MG tablet, Take 0.5 tablets (12.5 mg total) by mouth daily., Disp: 15 tablet, Rfl: 2   umeclidinium-vilanterol (ANORO ELLIPTA) 62.5-25 MCG/ACT AEPB, Inhale 1 puff into the lungs daily., Disp: 60 each, Rfl: 5   valACYclovir (VALTREX) 1000 MG tablet, Take 1,000 mg by mouth daily., Disp: , Rfl:

## 2022-06-21 NOTE — Progress Notes (Incomplete)
***In Progress***    Advanced Heart Failure Clinic Note   HPI:  Pam [name] is a 71 y.o. female with chronic systolic heart failure secondary to ischemic cardiomyopathy status post ICD, coronary artery disease with CTO of the mid LAD filled with left to left and right to left collaterals presenting today to establish care.  Her cardiac history dates back to December 2017 when she presented with an NSTEMI.  At that time echocardiogram demonstrated LVEF of 35 to 40% with preserved RV function.  Coronary angiography with heavily calcified CTO of the proximal to mid LAD filled by collaterals and calcified 80% stenosis of the circumflex/OM1.  Follow-up cardiac MRI demonstrated nonviable LAD territory.  She ultimately underwent atherectomy and IVUS guided PCI of the proximal circumflex/OM1.  In November 2020 she had a reduction in her LVEF to 25 to 30% and underwent Medtronic ICD placement in early 2021.  According to [name] she did fairly well for a few months however over the past several months has once again had some progression of dyspnea and a decrease in exercise capacity.  She was admitted in September 2023 with acute on chronic CHF exacerbation.  TTE and left heart cath from admission were stable compared to prior.  She was continued on medical therapy for CAD and started on low-dose GDMT for systolic heart failure.  Since that time she has been seen in [facility] clinic where GDMT was further uptitrated.  She also underwent PFTs that were significant for reversible obstructive lung disease and emphysema.   Presented to AHF clinic to see Dr. Daniel Nones on 05/31/2022. From a functional standpoint, [name] is likely NYHA IIb-III; she reported some PND; endorsed ability to perform ADLs without difficulty.  No reported chest pressure since her recent admission.  No lightheadedness, syncope, recent illness.  Today he returns to HF clinic for pharmacist medication titration. At last visit with Dr. Daniel Nones,  losartan was stopped and Entresto 24/26 mg BID was started.   Overall feeling ***. Dizziness, lightheadedness, fatigue:  Chest pain or palpitations:  How is your breathing?: *** SOB: Able to complete all ADLs. Activity level ***  Weight at home pounds. Takes furosemide/torsemide/bumex *** mg *** daily.  LEE PND/Orthopnea  Appetite *** Low-salt diet:   Physical Exam Cost/affordability of meds   Shortness of breath/dyspnea on exertion? {YES NO:22349}  Orthopnea/PND? {YES NO:22349} Edema? {YES NO:22349} Lightheadedness/dizziness? {YES NO:22349} Daily weights at home? {YES NO:22349} Blood pressure/heart rate monitoring at home? {YES [id number] Following low-sodium/fluid-restricted diet? {YES NO:22349}  HF Medications: Bisoprolol 5 mg daily  Entresto 24/26 mg BID Spironolactone 25 mg daily  Empagliflozin 10 mg daily  Furosemide 20 mg daily   Has the patient been experiencing any side effects to the medications prescribed?  {YES NO:22349}  Does the patient have any problems obtaining medications due to transportation or finances?   {YES NO:22349}  Understanding of regimen: {excellent/good/fair/poor:19665} Understanding of indications: {excellent/good/fair/poor:19665} Potential of compliance: {excellent/good/fair/poor:19665} Patient understands to avoid NSAIDs. Patient understands to avoid decongestants.    Pertinent Lab Values (from 05/31/2022): Serum creatinine 0.99 , BUN 22, Potassium 3.9, Sodium 134, BNP 916.2  Vital Signs: Weight: *** (last clinic weight: ***) Blood pressure: ***  Heart rate: ***   Assessment/Plan: NYHA IIB-III, Stage C, Systolic heart failrue Etiology of HF: Secondary to ischemic cardiomyopathy with nonviable myocardium as reported above NYHA class / AHA Stage: 2B to 3, difficult to accurately get an assessment of Ms. Faye's functional status. Volume status & Diuretics: Mildly hypervolemic on exam  today with elevated BNP, will increase Lasix to  40 mg for the next 3 days.  Follow-up with pharmacy with repeat labs Vasodilators: Stop losartan, start Entresto 24/26. Patient is unsure but may have had dry cough with ACE inhibitors. We will try Entresto for a few weeks to see if the symptoms recur.  If so will restart higher dose losartan. Beta-Blocker: Continue bisoprolol MRA: Continue spironolactone Cardiometabolic: Jardiance 10 mg daily Devices therapies & Valvulopathies: Primary prevention ICD in place Advanced therapies: Scheduling CPX to obtain further evaluation of future prognosis and a more objective assessment of functional status.  At this time we will continue to uptitrate medical therapy, possibly distant future LVAD candidate.   2.  COPD -PFTs and CT consistent with emphysema and obstructive lung disease.  Will place referral for pulmonology.   3.  Coronary artery disease -Management as per primary cardiology.  Follow up on 07/28/2022 with Dr. Daniel Nones.  Audry Riles, PharmD, BCPS, [facility], CPP Heart Failure Clinic Pharmacist [phone]

## 2022-06-21 NOTE — Patient Instructions (Addendum)
Start anoro ellipta 1 puff daily  Use albuterol rescue inhaler 1-2 puffs every 4-6 hours  You have emphysema/COPD based on your breathing tests and CT Chest scan  We will refer you to our lung cancer screening team for a repeat scan in May 2024  Follow up in 3 months

## 2022-06-22 ENCOUNTER — Other Ambulatory Visit (HOSPITAL_COMMUNITY): Payer: Self-pay | Admitting: Pharmacist

## 2022-06-22 ENCOUNTER — Ambulatory Visit (HOSPITAL_COMMUNITY)
Admission: RE | Admit: 2022-06-22 | Discharge: 2022-06-22 | Disposition: A | Discharge status: Home / Self Care | Payer: Federal, State, Local not specified - PPO | Source: Ambulatory Visit | Attending: Internal Medicine | Admitting: Internal Medicine

## 2022-06-22 ENCOUNTER — Encounter (HOSPITAL_COMMUNITY): Payer: Self-pay

## 2022-06-22 VITALS — BP 110/60 | HR 58 | Wt 116.0 lb

## 2022-06-22 DIAGNOSIS — I5022 Chronic systolic (congestive) heart failure: Secondary | ICD-10-CM

## 2022-06-22 DIAGNOSIS — I255 Ischemic cardiomyopathy: Secondary | ICD-10-CM | POA: Insufficient documentation

## 2022-06-22 DIAGNOSIS — I252 Old myocardial infarction: Secondary | ICD-10-CM | POA: Insufficient documentation

## 2022-06-22 DIAGNOSIS — I2510 Atherosclerotic heart disease of native coronary artery without angina pectoris: Secondary | ICD-10-CM | POA: Insufficient documentation

## 2022-06-22 DIAGNOSIS — J449 Chronic obstructive pulmonary disease, unspecified: Secondary | ICD-10-CM | POA: Diagnosis not present

## 2022-06-22 LAB — BASIC METABOLIC PANEL
Anion gap: 7 (ref 5–15)
BUN: 15 mg/dL (ref 8–23)
CO2: 25 mmol/L (ref 22–32)
Calcium: 9.4 mg/dL (ref 8.9–10.3)
Chloride: 103 mmol/L (ref 98–111)
Creatinine, Ser: 1.14 mg/dL — ABNORMAL HIGH (ref 0.44–1.00)
GFR, Estimated: 52 mL/min — ABNORMAL LOW (ref >60)
Glucose, Bld: 82 mg/dL (ref 70–99)
Potassium: 4.5 mmol/L (ref 3.5–5.1)
Sodium: 135 mmol/L (ref 135–145)

## 2022-06-22 MED ORDER — SPIRONOLACTONE 25 MG PO TABS: 25.0000 mg | ORAL_TABLET | Freq: Every day | ORAL | 3 refills | Status: DC

## 2022-06-22 NOTE — Patient Instructions (Signed)
It was a pleasure seeing you today!  MEDICATIONS: -We are changing your medications today -Take Lasix 40 mg (2 tablets) today then resume 20 mg (1 tablet) daily -Increase spironolactone to 25 mg (1 tablet) daily -Call if you have questions about your medications.  LABS: -We will call you if your labs need attention.  NEXT APPOINTMENT: Return to clinic in 1 month with Dr. Daniel Nones.  In general, to take care of your heart failure: -Limit your fluid intake to 2 Liters (half-gallon) per day.   -Limit your salt intake to ideally 2-3 grams (2000-3000 mg) per day. -Weigh yourself daily and record, and bring that "weight diary" to your next appointment.  (Weight gain of 2-3 pounds in 1 day typically means fluid weight.) -The medications for your heart are to help your heart and help you live longer.   -Please contact us before stopping any of your heart medications.  Call the clinic at [phone] with questions or to reschedule future appointments.

## 2022-06-22 NOTE — Progress Notes (Signed)
Advanced Heart Failure Clinic Note   Referring Physician: Loraine Leriche.,*  Primary Care: Loraine Leriche., MD Primary Cardiologist: Dr. Angelena Form, Dr. Johnsie Cancel HF Cardiologist. Dr. Daniel Nones  HPI:  Pam [name] is a 71 y.o. female with chronic systolic heart failure secondary to ischemic cardiomyopathy status post ICD, coronary artery disease with CTO of the mid LAD filled with left to left and right to left collaterals. Her cardiac history dates back to 07/2016 when she presented with an NSTEMI. At that time echocardiogram demonstrated LVEF of 35 to 40% with preserved RV function.  Coronary angiography with heavily calcified CTO of the proximal to mid LAD filled by collaterals and calcified 80% stenosis of the circumflex/OM1.  Follow-up cardiac MRI demonstrated nonviable LAD territory.  She ultimately underwent atherectomy and IVUS guided PCI of the proximal circumflex/OM1.  In 06/2019 she had a reduction in her LVEF to 25 to 30% and underwent Medtronic ICD placement in early 2021. According to Pam [name] she did fairly well for a few months however over the past several months has once again had some progression of dyspnea and a decrease in exercise capacity.  She was admitted in 04/2022 with acute on chronic CHF exacerbation.  TTE and left heart cath from admission were stable compared to prior.  She was continued on medical therapy for CAD and started on low-dose GDMT for systolic heart failure.  Since that time she has been seen in [facility] clinic where GDMT was further uptitrated.  She also underwent PFTs that were significant for reversible obstructive lung disease and emphysema.   Presented to AHF clinic to see Dr. Daniel Nones on 05/31/2022. From a functional standpoint, Pam [name] is likely NYHA IIb-III; she reported some PND; endorsed ability to perform ADLs without difficulty.  No reported chest pressure since her recent admission.  No lightheadedness, syncope, recent illness.  Today she  returns to HF clinic for pharmacist medication titration. At last visit with Dr. Daniel Nones, losartan was stopped and Entresto 24/26 mg BID was started. Overall, she is feeling well, though she reports "some good days, some bad days." She endorses fatigue, which varies from day-to-day, and is generally worse if she has overexerted herself the day before. No dizziness, lightheadedness, or palpitations. Notes some chest pain on exertion, which she has at baseline (for which she is on ranolazine). Her breathing has been complicated by occasional episodes of dyspnea when getting up to stand and when talking. She reports consistent SOB when walking long distances on flat ground or walking uphill or up stairs. She recently saw Pulmonology who prescribed her Anoro Ellipta for COPD (she will pick up today). She endorses a much improved cough since her admission in 04/2022.  She weighs herself at home every day and weight generally ranges from ~110-115 lbs. She takes furosemide 20 mg daily and reports improvement of LEE since her admission in 04/2022, but notes that her volume status fluctuates from day-to-day. She sleeps with 3 pillows at night due to SOB when lying down. Has trace bilateral LEE on exam today. Taking all medications as prescribed and tolerating all medications.   HF Medications: Bisoprolol 5 mg daily  Entresto 24/26 mg BID Spironolactone 25 mg daily  Empagliflozin 10 mg daily  Furosemide 20 mg daily   Understanding of regimen: good Understanding of indications: good Potential of compliance: good Patient understands to avoid NSAIDs. Patient understands to avoid decongestants.    Pertinent Lab Values (from 05/31/2022): Serum creatinine 0.99 , BUN 22, Potassium 3.9,  Sodium 134, BNP 916.2 BMET today pending  Vital Signs: Weight: 116 lbs (last clinic weight: 116 lbs) Blood pressure: 110/60 mmHg  Heart rate: 58 bpm   Assessment/Plan: NYHA IIB-III, Stage C, Systolic Heart Failure Etiology  of HF: Secondary to ischemic cardiomyopathy with nonviable myocardium as reported above NYHA class / AHA Stage: IIB to III, difficult to accurately get an assessment of Pam [name]'s functional status. Volume status & Diuretics: Mildly hypervolemic, trace bilateral LEE noted on exam. Take furosemide 40 mg tonight then resume furosemide 20 mg daily. BMET today pending.  Vasodilators: Continue Entresto 24/26 mg BID Beta-Blocker: Continue bisoprolol 5 mg daily MRA: Increase spironolactone to 25 mg daily. Repeat BMET in 2 weeks.  Cardiometabolic: Continue empagliflozin 10 mg daily Devices therapies & Valvulopathies: Primary prevention ICD in place Advanced therapies: CPX scheduled for 07/04/22 to obtain further evaluation of future prognosis and a more objective assessment of functional status.  At this time we will continue to uptitrate medical therapy, possibly distant future LVAD candidate.   2.  COPD - PFTs and CT consistent with emphysema and obstructive lung disease.  - Seen by Pulmonology 06/21/22, started on Anoro Ellipta 1 puff daily and albuterol PRN; patient says she is picking these inhalers up today.   3.  Coronary artery disease - Management as per primary cardiology.  Follow up on 07/28/2022 with Dr. Daniel Nones.  Audry Riles, PharmD, BCPS, [facility], CPP Heart Failure Clinic Pharmacist [phone]

## 2022-07-04 ENCOUNTER — Ambulatory Visit (HOSPITAL_COMMUNITY)
Admission: RE | Admit: 2022-07-04 | Discharge: 2022-07-04 | Disposition: A | Discharge status: Home / Self Care | Payer: Federal, State, Local not specified - PPO | Source: Ambulatory Visit | Attending: Cardiology | Admitting: Cardiology

## 2022-07-04 ENCOUNTER — Ambulatory Visit (HOSPITAL_COMMUNITY): Payer: Federal, State, Local not specified - PPO

## 2022-07-04 DIAGNOSIS — I5022 Chronic systolic (congestive) heart failure: Secondary | ICD-10-CM | POA: Insufficient documentation

## 2022-07-04 LAB — BASIC METABOLIC PANEL
Anion gap: 7 (ref 5–15)
BUN: 14 mg/dL (ref 8–23)
CO2: 24 mmol/L (ref 22–32)
Calcium: 9.1 mg/dL (ref 8.9–10.3)
Chloride: 103 mmol/L (ref 98–111)
Creatinine, Ser: 1.13 mg/dL — ABNORMAL HIGH (ref 0.44–1.00)
GFR, Estimated: 52 mL/min — ABNORMAL LOW (ref >60)
Glucose, Bld: 91 mg/dL (ref 70–99)
Potassium: 4.3 mmol/L (ref 3.5–5.1)
Sodium: 134 mmol/L — ABNORMAL LOW (ref 135–145)

## 2022-07-15 ENCOUNTER — Telehealth (HOSPITAL_COMMUNITY): Payer: Self-pay | Admitting: Vascular Surgery

## 2022-07-15 NOTE — Telephone Encounter (Signed)
Lvm to move 12/28 appt w/ AD TO 1/5

## 2022-07-28 ENCOUNTER — Encounter (HOSPITAL_COMMUNITY): Payer: Federal, State, Local not specified - PPO | Admitting: Cardiology

## 2022-07-31 ENCOUNTER — Other Ambulatory Visit: Payer: Self-pay | Admitting: Endocrinology

## 2022-08-05 ENCOUNTER — Other Ambulatory Visit (HOSPITAL_COMMUNITY): Payer: Self-pay

## 2022-08-05 ENCOUNTER — Encounter (HOSPITAL_COMMUNITY): Payer: Self-pay | Admitting: Cardiology

## 2022-08-05 ENCOUNTER — Ambulatory Visit (HOSPITAL_COMMUNITY)
Admission: RE | Admit: 2022-08-05 | Discharge: 2022-08-05 | Disposition: A | Discharge status: Home / Self Care | Payer: Federal, State, Local not specified - PPO | Source: Ambulatory Visit | Attending: Cardiology | Admitting: Cardiology

## 2022-08-05 VITALS — BP 100/54 | HR 62 | Wt 112.6 lb

## 2022-08-05 DIAGNOSIS — J449 Chronic obstructive pulmonary disease, unspecified: Secondary | ICD-10-CM | POA: Diagnosis not present

## 2022-08-05 DIAGNOSIS — J438 Other emphysema: Secondary | ICD-10-CM

## 2022-08-05 DIAGNOSIS — R54 Age-related physical debility: Secondary | ICD-10-CM | POA: Diagnosis not present

## 2022-08-05 DIAGNOSIS — Z79899 Other long term (current) drug therapy: Secondary | ICD-10-CM | POA: Insufficient documentation

## 2022-08-05 DIAGNOSIS — I25119 Atherosclerotic heart disease of native coronary artery with unspecified angina pectoris: Secondary | ICD-10-CM

## 2022-08-05 DIAGNOSIS — I5022 Chronic systolic (congestive) heart failure: Secondary | ICD-10-CM | POA: Diagnosis not present

## 2022-08-05 DIAGNOSIS — I2510 Atherosclerotic heart disease of native coronary artery without angina pectoris: Secondary | ICD-10-CM | POA: Diagnosis not present

## 2022-08-05 DIAGNOSIS — I959 Hypotension, unspecified: Secondary | ICD-10-CM | POA: Insufficient documentation

## 2022-08-05 DIAGNOSIS — Z7984 Long term (current) use of oral hypoglycemic drugs: Secondary | ICD-10-CM | POA: Diagnosis not present

## 2022-08-05 DIAGNOSIS — I255 Ischemic cardiomyopathy: Secondary | ICD-10-CM | POA: Insufficient documentation

## 2022-08-05 DIAGNOSIS — Z95810 Presence of automatic (implantable) cardiac defibrillator: Secondary | ICD-10-CM | POA: Diagnosis present

## 2022-08-05 LAB — BASIC METABOLIC PANEL
Anion gap: 10 (ref 5–15)
BUN: 17 mg/dL (ref 8–23)
CO2: 22 mmol/L (ref 22–32)
Calcium: 9.3 mg/dL (ref 8.9–10.3)
Chloride: 98 mmol/L (ref 98–111)
Creatinine, Ser: 1.12 mg/dL — ABNORMAL HIGH (ref 0.44–1.00)
GFR, Estimated: 53 mL/min — ABNORMAL LOW (ref >60)
Glucose, Bld: 93 mg/dL (ref 70–99)
Potassium: 4.9 mmol/L (ref 3.5–5.1)
Sodium: 130 mmol/L — ABNORMAL LOW (ref 135–145)

## 2022-08-05 LAB — BRAIN NATRIURETIC PEPTIDE: B Natriuretic Peptide: 460.3 pg/mL — ABNORMAL HIGH (ref 0.0–100.0)

## 2022-08-05 NOTE — Progress Notes (Signed)
ADVANCED HEART FAILURE CLINIC NOTE  Referring Physician: Loraine Leriche.,*  Primary Care: Loraine Leriche., MD Primary Cardiologist: Dr. Angelena Form, Dr. Johnsie Cancel  HPI: Pam [name] is a 72 y.o. female with chronic systolic heart failure secondary to ischemic cardiomyopathy status post ICD, coronary artery disease with CTO of the mid LAD filled with left to left and right to left collaterals presenting today to establish care.  Her cardiac history dates back to December 2017 when she presented with an NSTEMI.  At that time echocardiogram demonstrated LVEF of 35 to 40% with preserved RV function.  Coronary angiography with heavily calcified CTO of the proximal to mid LAD filled by collaterals and calcified 80% stenosis of the circumflex/OM1.  Follow-up cardiac MRI demonstrated nonviable LAD territory.  She ultimately underwent atherectomy and IVUS guided PCI of the proximal circumflex/OM1.  In November 2020 she had a reduction in her LVEF to 25 to 30% and underwent Medtronic ICD placement in early 2021.  According to Pam [name] she did fairly well for a few months however over the past several months has once again had some progression of dyspnea and a decrease in exercise capacity.  She was admitted in September 2023 with acute on chronic CHF exacerbation.  TTE and left heart cath from admission were stable compared to prior.  She was continued on medical therapy for CAD and started on low-dose GDMT for systolic heart failure.  Since that time she has been seen in [facility] clinic where GDMT was further uptitrated.  She also underwent PFTs that were significant for reversible obstructive lung disease and emphysema.  Interval history: From a functional standpoint Pam [name] still has NYHA III symptoms.  She still performs all ADLs independently; goes grocery shopping, drives to doctor appointments but becomes quickly fatigued and short of breath requiring several minutes of rest.  Since her last  appointment she has seen pulmonology where she was diagnosed with obstructive lung disease/emphysema and has also underwent a CPX.  Activity level/exercise tolerance: NYHA III Orthopnea:  Sleeps on 2 pillows Paroxysmal noctural dyspnea: Yes but infrequent Chest pain/pressure: Stable at this time Orthostatic lightheadedness: No Palpitations: No Lower extremity edema: No, resolved Presyncope/syncope: No Cough: No  Past Medical History:  Diagnosis Date   Abnormal EKG    AICD (automatic cardioverter/defibrillator) present    Alcoholic intoxication without complication ([place])    Allergy    Cardiomyopathy, ischemic    Cataract    Chest pain 08/29/2016   CHF (congestive heart failure) ([place])    Chronic systolic heart failure ([place]) 08/29/2016   Facial laceration    Fall 07/07/2016   Hyperlipidemia    Hypertension 03/03/2017   Hyponatremia    Hypothyroidism 07/08/2016   NSTEMI (non-ST elevated myocardial infarction) ([place])    Syncope    Thyroid disease     Current Outpatient Medications  Medication Sig Dispense Refill   acetaminophen (TYLENOL) 500 MG tablet Take 1,000 mg by mouth every 6 (six) hours as needed for moderate pain.     albuterol (VENTOLIN HFA) 108 (90 Base) MCG/ACT inhaler Inhale 2 puffs into the lungs every 6 (six) hours as needed for wheezing or shortness of breath. 8 g 6   alendronate (FOSAMAX) 70 MG tablet TAKE 1 TABLET(70 MG) BY MOUTH 1 TIME A WEEK WITH A FULL GLASS OF WATER AND ON AN EMPTY STOMACH 12 tablet 1   atorvastatin (LIPITOR) 80 MG tablet TAKE 1 TABLET(80 MG) BY MOUTH DAILY 90 tablet 1   bisoprolol (ZEBETA) 5  MG tablet Take 1 tablet (5 mg total) by mouth daily. 30 tablet 11   Cholecalciferol (VITAMIN D3) 50 MCG (2000 UT) TABS Take 2,000 mcg by mouth daily.     clopidogrel (PLAVIX) 75 MG tablet TAKE 1 TABLET(75 MG) BY MOUTH DAILY WITH BREAKFAST 90 tablet 3   empagliflozin (JARDIANCE) 10 MG TABS tablet Take 1 tablet (10 mg total) by mouth daily. 30 tablet 11    furosemide (LASIX) 20 MG tablet Take 20 mg by mouth daily.     levothyroxine (SYNTHROID) 25 MCG tablet Take 37.5 mcg by mouth daily before breakfast.     nitroGLYCERIN (NITROSTAT) 0.4 MG SL tablet Place 1 tablet (0.4 mg total) under the tongue every 5 (five) minutes x 3 doses as needed for chest pain. 75 tablet 3   ranolazine (RANEXA) 1000 MG SR tablet Take 1 tablet (1,000 mg total) by mouth 2 (two) times daily. 180 tablet 3   sacubitril-valsartan (ENTRESTO) 24-26 MG Take 1 tablet by mouth 2 (two) times daily. 60 tablet 11   spironolactone (ALDACTONE) 25 MG tablet Take 1 tablet (25 mg total) by mouth daily. 90 tablet 3   umeclidinium-vilanterol (ANORO ELLIPTA) 62.5-25 MCG/ACT AEPB Inhale 1 puff into the lungs daily. 60 each 5   valACYclovir (VALTREX) 1000 MG tablet Take 1,000 mg by mouth daily.     No current facility-administered medications for this encounter.    Allergies  Allergen Reactions   Lisinopril Cough    Dry cough      Social History   Socioeconomic History   Marital status: Married    Spouse name: Comptroller   Number of children: 2   Years of education: Not on file   Highest education level: High school graduate  Occupational History   Occupation: Retired-worked for post office  Tobacco Use   Smoking status: Former    Packs/day: 1.00    Years: 50.00    Total pack years: 50.00    Types: Cigarettes    Quit date: 07/07/2016    Years since quitting: 6.0   Smokeless tobacco: Never  Vaping Use   Vaping Use: Never used  Substance and Sexual Activity   Alcohol use: Not Currently   Drug use: Never   Sexual activity: Not Currently  Other Topics Concern   Not on file  Social History Narrative   Not on file   Social Determinants of Health   Financial Resource Strain: Low Risk  (04/29/2022)   Overall Financial Resource Strain (CARDIA)    Difficulty of Paying Living Expenses: Not hard at all  Food Insecurity: No Food Insecurity (04/29/2022)   Hunger Vital Sign     Worried About Running Out of Food in the Last Year: Never true    [place] in the Last Year: Never true  Transportation Needs: No Transportation Needs (04/29/2022)   PRAPARE - Hydrologist (Medical): No    Lack of Transportation (Non-Medical): No  Physical Activity: Not on file  Stress: Not on file  Social Connections: Not on file  Intimate Partner Violence: Not on file      Family History  Problem Relation Age of Onset   Kidney failure Mother    Heart disease Mother        CHF   Heart disease Sister    Colon cancer Neg Hx    Esophageal cancer Neg Hx    Rectal cancer Neg Hx    Stomach cancer Neg Hx  PHYSICAL EXAM: Vitals:   08/05/22 0953  BP: (!) 100/54  Pulse: 62  SpO2: 99%   GENERAL: No apparent distress HEENT: Negative for arcus senilis or xanthelasma. There is no scleral icterus.  The mucous membranes are pink and moist.   NECK: Supple, No masses. Normal carotid upstrokes without bruits. No masses or thyromegaly.    CHEST: There are no chest wall deformities. There is no chest wall tenderness. Respirations are unlabored.  Lungs-CTA bilaterally with very mild expiratory wheezing at the bases CARDIAC:  JVP: 8         Normal S1, S2  Normal rate with regular rhythm. No murmurs, rubs or gallops.  Pulses are 2+ and symmetrical in upper and lower extremities.  1+ edema ABDOMEN: Soft, non-tender, non-distended. There are no masses or hepatomegaly. There are normal bowel sounds.  EXTREMITIES: Cool distal extremities LYMPHATIC: No axillary or supraclavicular lymphadenopathy.  NEUROLOGIC: Patient is oriented x3 with no focal or lateralizing neurologic deficits.  PSYCH: Patients affect is appropriate, there is no evidence of anxiety or depression.  SKIN: Warm and dry; no lesions or wounds.   DATA REVIEW  ECG: Normal sinus rhythm  ECHO: 04/27/22: LVEF 25%-30%, RV function normal.  06/19/19: LVEF 25%-30%, Normal RV function.   CMR:   12/17: . Normal size and thickness of the left ventricle. Systolic function is moderately decreased (LVEF = 38%).  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 There is akinesis of the apical lateral, mid anterior, anteroseptal walls.  The LAD territory is non-viable, RCA and LCX territory is viable.  2. Normal right ventricular size, thickness and systolic function (RVEF = 54%) with no regional wall motion abnormalities.  3.  Normal biatrial size.  4.  Mild mitral and mild to moderate tricuspid regurgitation.  5. Normal size of the aortic root, ascending aorta and pulmonary artery.  6.  Normal pericardium, trivial pericardial effusion.   CATH: 04/29/22:  Prox LAD to Mid LAD lesion is 90% stenosed with 95% stenosed side branch in 1st Sept. Mid LAD to Dist LAD lesion is 100% stenosed.   Previously placed Prox Cx to Dist Cx stent of unknown type is  widely patent with 0% stenosed side branch in 1st Mrg.   Lat 1st Mrg lesion is 50% stenosed.   LV end diastolic pressure is moderately elevated.   There is no aortic valve stenosis.   ASSESSMENT & PLAN:  NYHA IIB-III, Stage C, Systolic heart failrue Etiology of HF: Secondary to ischemic cardiomyopathy with nonviable myocardium as reported above NYHA class / AHA Stage: IIB- III Volume status & Diuretics: Mildly hypervolemic, lasix '40mg'$  PO x 1 today; continue '20mg'$  daily otherwise. Instructed on increasing diuretic dosing for weight gain / peripheral edema. Vasodilators: Continue Entresto 24/'26mg'$  BID.  Beta-Blocker: Continue bisoprolol MRA: Continue spironolactone Cardiometabolic: Jardiance 10 mg daily Devices therapies & Valvulopathies: Primary prevention ICD in place Advanced therapies: CPX w/ VE/VCO2 slop of 69.4 and PVO2 of 13.2; CPX consistent with moderate to severe cardiac limitation in addition to obstructive lung disease. I had a lengthy discussion with the patient today regarding  advanced therapies. Her COPD, age and frailty make her a marginal LVAD candidate, however, she has no absolute contraindications to durable VAD support. At this time medication uptitration is limited by bradycardia & hypotension. Will schedule RHC. If cardiac index is reduced plan to start LVAD evaluation.   2.  COPD -Now followed by pulmonology; started on inhalers.   3.  Coronary artery disease -Management as per primary cardiology  Wyndi Northrup Advanced Heart Failure Mechanical Circulatory Support

## 2022-08-05 NOTE — Patient Instructions (Signed)
There has been no changes to your medications.  Labs done today, your results will be available in MyChart, we will contact you for abnormal readings.  You are scheduled for a Cardiac Catheterization on Thursday, January 11 with Dr.  Daniel Nones .  1. Please arrive at the [facility] (Main Entrance A) at [facility] [address] [place], [place] [address] at 10:00 AM (This time is two hours before your procedure to ensure your preparation). Free valet parking service is available.   Special note: Every effort is made to have your procedure done on time. Please understand that emergencies sometimes delay scheduled procedures.  2. Diet: Do not eat solid foods after midnight.  The patient may have clear liquids until 5am upon the day of the procedure.  3. Medication instructions in preparation for your procedure:   Contrast Allergy: No  HOLD Lasix, Spironolactone and Jardiance the morning of your procedure   On the morning of your procedure, take your Plavix/Clopidogrel and any morning medicines NOT listed above.  You may use sips of water.  5. Plan for one night stay--bring personal belongings. 6. Bring a current list of your medications and current insurance cards. 7. You MUST have a responsible person to drive you home. 8. Someone MUST be with you the first 24 hours after you arrive home or your discharge will be delayed. 9. Please wear clothes that are easy to get on and off and wear slip-on shoes.  Your physician recommends that you schedule a follow-up appointment in: 2 months  If you have any questions or concerns before your next appointment please send [place] a message through [place] or call our office at [phone].    TO LEAVE A MESSAGE FOR THE NURSE SELECT OPTION 2, PLEASE LEAVE A MESSAGE INCLUDING: YOUR NAME DATE OF BIRTH CALL BACK NUMBER REASON FOR CALL**this is important as we prioritize the call backs  YOU WILL RECEIVE A CALL BACK THE SAME DAY AS LONG AS YOU  CALL BEFORE 4:00 PM  At the [facility], you and your health needs are our priority. As part of our continuing mission to provide you with exceptional heart care, we have created designated Provider Care Teams. These Care Teams include your primary Cardiologist (physician) and Advanced Practice Providers (APPs- Physician Assistants and Nurse Practitioners) who all work together to provide you with the care you need, when you need it.   You may see any of the following providers on your designated Care Team at your next follow up: Dr Glori Bickers Dr Loralie Champagne Dr. Roxana Hires, NP Lyda Jester, [place] [facility], [place] Forestine Na, NP Audry Riles, PharmD   Please be sure to bring in all your medications bottles to every appointment.

## 2022-08-05 NOTE — H&P (View-Only) (Signed)
ADVANCED HEART FAILURE CLINIC NOTE  Referring Physician: Loraine Leriche.,*  Primary Care: Loraine Leriche., MD Primary Cardiologist: Dr. Angelena Form, Dr. Johnsie Cancel  HPI: Pam [name] is a 72 y.o. female with chronic systolic heart failure secondary to ischemic cardiomyopathy status post ICD, coronary artery disease with CTO of the mid LAD filled with left to left and right to left collaterals presenting today to establish care.  Her cardiac history dates back to December 2017 when she presented with an NSTEMI.  At that time echocardiogram demonstrated LVEF of 35 to 40% with preserved RV function.  Coronary angiography with heavily calcified CTO of the proximal to mid LAD filled by collaterals and calcified 80% stenosis of the circumflex/OM1.  Follow-up cardiac MRI demonstrated nonviable LAD territory.  She ultimately underwent atherectomy and IVUS guided PCI of the proximal circumflex/OM1.  In November 2020 she had a reduction in her LVEF to 25 to 30% and underwent Medtronic ICD placement in early 2021.  According to Pam [name] she did fairly well for a few months however over the past several months has once again had some progression of dyspnea and a decrease in exercise capacity.  She was admitted in September 2023 with acute on chronic CHF exacerbation.  TTE and left heart cath from admission were stable compared to prior.  She was continued on medical therapy for CAD and started on low-dose GDMT for systolic heart failure.  Since that time she has been seen in [facility] clinic where GDMT was further uptitrated.  She also underwent PFTs that were significant for reversible obstructive lung disease and emphysema.  Interval history: From a functional standpoint Pam [name] still has NYHA III symptoms.  She still performs all ADLs independently; goes grocery shopping, drives to doctor appointments but becomes quickly fatigued and short of breath requiring several minutes of rest.  Since her last  appointment she has seen pulmonology where she was diagnosed with obstructive lung disease/emphysema and has also underwent a CPX.  Activity level/exercise tolerance: NYHA III Orthopnea:  Sleeps on 2 pillows Paroxysmal noctural dyspnea: Yes but infrequent Chest pain/pressure: Stable at this time Orthostatic lightheadedness: No Palpitations: No Lower extremity edema: No, resolved Presyncope/syncope: No Cough: No  Past Medical History:  Diagnosis Date   Abnormal EKG    AICD (automatic cardioverter/defibrillator) present    Alcoholic intoxication without complication ([place])    Allergy    Cardiomyopathy, ischemic    Cataract    Chest pain 08/29/2016   CHF (congestive heart failure) ([place])    Chronic systolic heart failure ([place]) 08/29/2016   Facial laceration    Fall 07/07/2016   Hyperlipidemia    Hypertension 03/03/2017   Hyponatremia    Hypothyroidism 07/08/2016   NSTEMI (non-ST elevated myocardial infarction) ([place])    Syncope    Thyroid disease     Current Outpatient Medications  Medication Sig Dispense Refill   acetaminophen (TYLENOL) 500 MG tablet Take 1,000 mg by mouth every 6 (six) hours as needed for moderate pain.     albuterol (VENTOLIN HFA) 108 (90 Base) MCG/ACT inhaler Inhale 2 puffs into the lungs every 6 (six) hours as needed for wheezing or shortness of breath. 8 g 6   alendronate (FOSAMAX) 70 MG tablet TAKE 1 TABLET(70 MG) BY MOUTH 1 TIME A WEEK WITH A FULL GLASS OF WATER AND ON AN EMPTY STOMACH 12 tablet 1   atorvastatin (LIPITOR) 80 MG tablet TAKE 1 TABLET(80 MG) BY MOUTH DAILY 90 tablet 1   bisoprolol (ZEBETA) 5  MG tablet Take 1 tablet (5 mg total) by mouth daily. 30 tablet 11   Cholecalciferol (VITAMIN D3) 50 MCG (2000 UT) TABS Take 2,000 mcg by mouth daily.     clopidogrel (PLAVIX) 75 MG tablet TAKE 1 TABLET(75 MG) BY MOUTH DAILY WITH BREAKFAST 90 tablet 3   empagliflozin (JARDIANCE) 10 MG TABS tablet Take 1 tablet (10 mg total) by mouth daily. 30 tablet 11    furosemide (LASIX) 20 MG tablet Take 20 mg by mouth daily.     levothyroxine (SYNTHROID) 25 MCG tablet Take 37.5 mcg by mouth daily before breakfast.     nitroGLYCERIN (NITROSTAT) 0.4 MG SL tablet Place 1 tablet (0.4 mg total) under the tongue every 5 (five) minutes x 3 doses as needed for chest pain. 75 tablet 3   ranolazine (RANEXA) 1000 MG SR tablet Take 1 tablet (1,000 mg total) by mouth 2 (two) times daily. 180 tablet 3   sacubitril-valsartan (ENTRESTO) 24-26 MG Take 1 tablet by mouth 2 (two) times daily. 60 tablet 11   spironolactone (ALDACTONE) 25 MG tablet Take 1 tablet (25 mg total) by mouth daily. 90 tablet 3   umeclidinium-vilanterol (ANORO ELLIPTA) 62.5-25 MCG/ACT AEPB Inhale 1 puff into the lungs daily. 60 each 5   valACYclovir (VALTREX) 1000 MG tablet Take 1,000 mg by mouth daily.     No current facility-administered medications for this encounter.    Allergies  Allergen Reactions   Lisinopril Cough    Dry cough      Social History   Socioeconomic History   Marital status: Married    Spouse name: Comptroller   Number of children: 2   Years of education: Not on file   Highest education level: High school graduate  Occupational History   Occupation: Retired-worked for post office  Tobacco Use   Smoking status: Former    Packs/day: 1.00    Years: 50.00    Total pack years: 50.00    Types: Cigarettes    Quit date: 07/07/2016    Years since quitting: 6.0   Smokeless tobacco: Never  Vaping Use   Vaping Use: Never used  Substance and Sexual Activity   Alcohol use: Not Currently   Drug use: Never   Sexual activity: Not Currently  Other Topics Concern   Not on file  Social History Narrative   Not on file   Social Determinants of Health   Financial Resource Strain: Low Risk  (04/29/2022)   Overall Financial Resource Strain (CARDIA)    Difficulty of Paying Living Expenses: Not hard at all  Food Insecurity: No Food Insecurity (04/29/2022)   Hunger Vital Sign     Worried About Running Out of Food in the Last Year: Never true    [place] in the Last Year: Never true  Transportation Needs: No Transportation Needs (04/29/2022)   PRAPARE - Hydrologist (Medical): No    Lack of Transportation (Non-Medical): No  Physical Activity: Not on file  Stress: Not on file  Social Connections: Not on file  Intimate Partner Violence: Not on file      Family History  Problem Relation Age of Onset   Kidney failure Mother    Heart disease Mother        CHF   Heart disease Sister    Colon cancer Neg Hx    Esophageal cancer Neg Hx    Rectal cancer Neg Hx    Stomach cancer Neg Hx  PHYSICAL EXAM: Vitals:   08/05/22 0953  BP: (!) 100/54  Pulse: 62  SpO2: 99%   GENERAL: No apparent distress HEENT: Negative for arcus senilis or xanthelasma. There is no scleral icterus.  The mucous membranes are pink and moist.   NECK: Supple, No masses. Normal carotid upstrokes without bruits. No masses or thyromegaly.    CHEST: There are no chest wall deformities. There is no chest wall tenderness. Respirations are unlabored.  Lungs-CTA bilaterally with very mild expiratory wheezing at the bases CARDIAC:  JVP: 8         Normal S1, S2  Normal rate with regular rhythm. No murmurs, rubs or gallops.  Pulses are 2+ and symmetrical in upper and lower extremities.  1+ edema ABDOMEN: Soft, non-tender, non-distended. There are no masses or hepatomegaly. There are normal bowel sounds.  EXTREMITIES: Cool distal extremities LYMPHATIC: No axillary or supraclavicular lymphadenopathy.  NEUROLOGIC: Patient is oriented x3 with no focal or lateralizing neurologic deficits.  PSYCH: Patients affect is appropriate, there is no evidence of anxiety or depression.  SKIN: Warm and dry; no lesions or wounds.   DATA REVIEW  ECG: Normal sinus rhythm  ECHO: 04/27/22: LVEF 25%-30%, RV function normal.  06/19/19: LVEF 25%-30%, Normal RV function.   CMR:   12/17: . Normal size and thickness of the left ventricle. Systolic function is moderately decreased (LVEF = 38%).  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 There is akinesis of the apical lateral, mid anterior, anteroseptal walls.  The LAD territory is non-viable, RCA and LCX territory is viable.  2. Normal right ventricular size, thickness and systolic function (RVEF = 54%) with no regional wall motion abnormalities.  3.  Normal biatrial size.  4.  Mild mitral and mild to moderate tricuspid regurgitation.  5. Normal size of the aortic root, ascending aorta and pulmonary artery.  6.  Normal pericardium, trivial pericardial effusion.   CATH: 04/29/22:  Prox LAD to Mid LAD lesion is 90% stenosed with 95% stenosed side branch in 1st Sept. Mid LAD to Dist LAD lesion is 100% stenosed.   Previously placed Prox Cx to Dist Cx stent of unknown type is  widely patent with 0% stenosed side branch in 1st Mrg.   Lat 1st Mrg lesion is 50% stenosed.   LV end diastolic pressure is moderately elevated.   There is no aortic valve stenosis.   ASSESSMENT & PLAN:  NYHA IIB-III, Stage C, Systolic heart failrue Etiology of HF: Secondary to ischemic cardiomyopathy with nonviable myocardium as reported above NYHA class / AHA Stage: IIB- III Volume status & Diuretics: Mildly hypervolemic, lasix '40mg'$  PO x 1 today; continue '20mg'$  daily otherwise. Instructed on increasing diuretic dosing for weight gain / peripheral edema. Vasodilators: Continue Entresto 24/'26mg'$  BID.  Beta-Blocker: Continue bisoprolol MRA: Continue spironolactone Cardiometabolic: Jardiance 10 mg daily Devices therapies & Valvulopathies: Primary prevention ICD in place Advanced therapies: CPX w/ VE/VCO2 slop of 69.4 and PVO2 of 13.2; CPX consistent with moderate to severe cardiac limitation in addition to obstructive lung disease. I had a lengthy discussion with the patient today regarding  advanced therapies. Her COPD, age and frailty make her a marginal LVAD candidate, however, she has no absolute contraindications to durable VAD support. At this time medication uptitration is limited by bradycardia & hypotension. Will schedule RHC. If cardiac index is reduced plan to start LVAD evaluation.   2.  COPD -Now followed by pulmonology; started on inhalers.   3.  Coronary artery disease -Management as per primary cardiology  Cabell Lazenby Advanced Heart Failure Mechanical Circulatory Support

## 2022-08-11 ENCOUNTER — Ambulatory Visit (HOSPITAL_COMMUNITY)
Admission: RE | Admit: 2022-08-11 | Discharge: 2022-08-11 | Disposition: A | Discharge status: Home / Self Care | Payer: Federal, State, Local not specified - PPO | Attending: Cardiology | Admitting: Cardiology

## 2022-08-11 ENCOUNTER — Encounter (HOSPITAL_COMMUNITY)
Admission: RE | Disposition: A | Discharge status: Home / Self Care | Payer: Self-pay | Source: Home / Self Care | Attending: Cardiology

## 2022-08-11 ENCOUNTER — Other Ambulatory Visit: Payer: Self-pay

## 2022-08-11 DIAGNOSIS — I255 Ischemic cardiomyopathy: Secondary | ICD-10-CM | POA: Diagnosis not present

## 2022-08-11 DIAGNOSIS — Z79899 Other long term (current) drug therapy: Secondary | ICD-10-CM | POA: Diagnosis not present

## 2022-08-11 DIAGNOSIS — Z95810 Presence of automatic (implantable) cardiac defibrillator: Secondary | ICD-10-CM | POA: Insufficient documentation

## 2022-08-11 DIAGNOSIS — J449 Chronic obstructive pulmonary disease, unspecified: Secondary | ICD-10-CM | POA: Diagnosis not present

## 2022-08-11 DIAGNOSIS — I2510 Atherosclerotic heart disease of native coronary artery without angina pectoris: Secondary | ICD-10-CM | POA: Diagnosis not present

## 2022-08-11 DIAGNOSIS — I5022 Chronic systolic (congestive) heart failure: Secondary | ICD-10-CM | POA: Insufficient documentation

## 2022-08-11 DIAGNOSIS — I2582 Chronic total occlusion of coronary artery: Secondary | ICD-10-CM | POA: Diagnosis not present

## 2022-08-11 DIAGNOSIS — I110 Hypertensive heart disease with heart failure: Secondary | ICD-10-CM | POA: Insufficient documentation

## 2022-08-11 DIAGNOSIS — I252 Old myocardial infarction: Secondary | ICD-10-CM | POA: Insufficient documentation

## 2022-08-11 DIAGNOSIS — I5020 Unspecified systolic (congestive) heart failure: Secondary | ICD-10-CM | POA: Diagnosis not present

## 2022-08-11 DIAGNOSIS — Z87891 Personal history of nicotine dependence: Secondary | ICD-10-CM | POA: Insufficient documentation

## 2022-08-11 HISTORY — PX: RIGHT HEART CATH: CATH118263

## 2022-08-11 LAB — POCT I-STAT EG7
Acid-base deficit: 1 mmol/L (ref 0.0–2.0)
Acid-base deficit: 2 mmol/L (ref 0.0–2.0)
Bicarbonate: 23.2 mmol/L (ref 20.0–28.0)
Bicarbonate: 23.9 mmol/L (ref 20.0–28.0)
Calcium, Ion: 1.25 mmol/L (ref 1.15–1.40)
Calcium, Ion: 1.25 mmol/L (ref 1.15–1.40)
HCT: 36 % (ref 36.0–46.0)
HCT: 36 % (ref 36.0–46.0)
Hemoglobin: 12.2 g/dL (ref 12.0–15.0)
Hemoglobin: 12.2 g/dL (ref 12.0–15.0)
O2 Saturation: 54 %
O2 Saturation: 55 %
Potassium: 4.6 mmol/L (ref 3.5–5.1)
Potassium: 4.6 mmol/L (ref 3.5–5.1)
Sodium: 132 mmol/L — ABNORMAL LOW (ref 135–145)
Sodium: 133 mmol/L — ABNORMAL LOW (ref 135–145)
TCO2: 24 mmol/L (ref 22–32)
TCO2: 25 mmol/L (ref 22–32)
pCO2, Ven: 39 mmHg — ABNORMAL LOW (ref 44–60)
pCO2, Ven: 39.3 mmHg — ABNORMAL LOW (ref 44–60)
pH, Ven: 7.383 (ref 7.25–7.43)
pH, Ven: 7.392 (ref 7.25–7.43)
pO2, Ven: 29 mmHg — CL (ref 32–45)
pO2, Ven: 29 mmHg — CL (ref 32–45)

## 2022-08-11 LAB — CBC
HCT: 37.3 % (ref 36.0–46.0)
Hemoglobin: 12.3 g/dL (ref 12.0–15.0)
MCH: 29.7 pg (ref 26.0–34.0)
MCHC: 33 g/dL (ref 30.0–36.0)
MCV: 90.1 fL (ref 80.0–100.0)
Platelets: 406 10*3/uL — ABNORMAL HIGH (ref 150–400)
RBC: 4.14 MIL/uL (ref 3.87–5.11)
RDW: 16.5 % — ABNORMAL HIGH (ref 11.5–15.5)
WBC: 7.7 10*3/uL (ref 4.0–10.5)
nRBC: 0 % (ref 0.0–0.2)

## 2022-08-11 SURGERY — RIGHT HEART CATH
Anesthesia: LOCAL

## 2022-08-11 MED ORDER — HEPARIN (PORCINE) IN NACL 1000-0.9 UT/500ML-% IV SOLN
INTRAVENOUS | Status: AC
  Filled 2022-08-11: qty 500

## 2022-08-11 MED ORDER — HEPARIN (PORCINE) IN NACL 1000-0.9 UT/500ML-% IV SOLN
INTRAVENOUS | Status: DC | PRN
  Administered 2022-08-11: 500 mL

## 2022-08-11 MED ORDER — SODIUM CHLORIDE 0.9% FLUSH: 3.0000 mL | INTRAVENOUS | Status: DC | PRN

## 2022-08-11 MED ORDER — ACETAMINOPHEN 325 MG PO TABS: 650.0000 mg | ORAL_TABLET | Freq: Once | ORAL | Status: DC

## 2022-08-11 MED ORDER — SODIUM CHLORIDE 0.9 % IV SOLN: 250.0000 mL | INTRAVENOUS | Status: DC | PRN

## 2022-08-11 MED ORDER — ONDANSETRON HCL 4 MG/2ML IJ SOLN: 4.0000 mg | Freq: Four times a day (QID) | INTRAMUSCULAR | Status: DC | PRN

## 2022-08-11 MED ORDER — FUROSEMIDE 20 MG PO TABS: 20.0000 mg | ORAL_TABLET | ORAL | 10 refills | Status: DC | PRN

## 2022-08-11 MED ORDER — LIDOCAINE HCL (PF) 1 % IJ SOLN
INTRAMUSCULAR | Status: AC
  Filled 2022-08-11: qty 30

## 2022-08-11 MED ORDER — SODIUM CHLORIDE 0.9% FLUSH: 3.0000 mL | Freq: Two times a day (BID) | INTRAVENOUS | Status: DC

## 2022-08-11 MED ORDER — SODIUM CHLORIDE 0.9 % IV SOLN: INTRAVENOUS | Status: DC

## 2022-08-11 MED ORDER — ACETAMINOPHEN 325 MG PO TABS
650.0000 mg | ORAL_TABLET | ORAL | Status: DC | PRN
  Administered 2022-08-11: 650 mg via ORAL
  Filled 2022-08-11: qty 2

## 2022-08-11 MED ORDER — LIDOCAINE HCL (PF) 1 % IJ SOLN
INTRAMUSCULAR | Status: DC | PRN
  Administered 2022-08-11: 2 mL via INTRADERMAL

## 2022-08-11 SURGICAL SUPPLY — 4 items
CATH BALLN WEDGE 5F 110CM (CATHETERS) IMPLANT
PACK CARDIAC CATHETERIZATION (CUSTOM PROCEDURE TRAY) ×1 IMPLANT
SHEATH GLIDE SLENDER 4/5FR (SHEATH) IMPLANT
TRANSDUCER W/STOPCOCK (MISCELLANEOUS) ×1 IMPLANT

## 2022-08-11 NOTE — Discharge Instructions (Signed)
Activity Rest as told by your health care provider. Return to your normal activities as told by your health care provider. Ask your health care provider what activities are safe for you. May shower and remove bandage 24 hours after discharge. If you were given a sedative during the procedure, it can affect you for several hours. Do not drive or operate machinery until your health care provider says that it is safe. General instructions      Check your IV insertion area every day for signs of infection. Check for: Redness, swelling, or pain. Fluid or blood. Warmth. Pus or a bad smell. Take over-the-counter and prescription medicines only as told by your health care provider. Contact a health care provider if: You have redness, swelling, warmth, pus, or pain around the IV insertion site. 

## 2022-08-11 NOTE — Interval H&P Note (Signed)
History and Physical Interval Note:  08/11/2022 11:59 AM  Pam [name]  has presented today for surgery, with the diagnosis of HF.  The various methods of treatment have been discussed with the patient and family. After consideration of risks, benefits and other options for treatment, the patient has consented to  Procedure(s): RIGHT HEART CATH (N/A) as a surgical intervention.  The patient's history has been reviewed, patient examined, no change in status, stable for surgery.  I have reviewed the patient's chart and labs.  Questions were answered to the patient's satisfaction.     [name]

## 2022-08-12 ENCOUNTER — Encounter (HOSPITAL_COMMUNITY): Payer: Self-pay | Admitting: Cardiology

## 2022-08-16 ENCOUNTER — Encounter (HOSPITAL_COMMUNITY): Payer: Self-pay

## 2022-08-17 ENCOUNTER — Ambulatory Visit (HOSPITAL_COMMUNITY)
Admission: RE | Admit: 2022-08-17 | Discharge: 2022-08-17 | Disposition: A | Discharge status: Home / Self Care | Payer: Federal, State, Local not specified - PPO | Source: Ambulatory Visit | Attending: Cardiology | Admitting: Cardiology

## 2022-08-17 VITALS — BP 112/50 | HR 62 | Wt 114.0 lb

## 2022-08-17 DIAGNOSIS — J439 Emphysema, unspecified: Secondary | ICD-10-CM | POA: Diagnosis not present

## 2022-08-17 DIAGNOSIS — Z79899 Other long term (current) drug therapy: Secondary | ICD-10-CM | POA: Diagnosis not present

## 2022-08-17 DIAGNOSIS — Z95810 Presence of automatic (implantable) cardiac defibrillator: Secondary | ICD-10-CM | POA: Diagnosis not present

## 2022-08-17 DIAGNOSIS — Z955 Presence of coronary angioplasty implant and graft: Secondary | ICD-10-CM | POA: Diagnosis not present

## 2022-08-17 DIAGNOSIS — I5022 Chronic systolic (congestive) heart failure: Secondary | ICD-10-CM | POA: Insufficient documentation

## 2022-08-17 DIAGNOSIS — Z7984 Long term (current) use of oral hypoglycemic drugs: Secondary | ICD-10-CM | POA: Diagnosis not present

## 2022-08-17 DIAGNOSIS — I255 Ischemic cardiomyopathy: Secondary | ICD-10-CM | POA: Insufficient documentation

## 2022-08-17 DIAGNOSIS — R54 Age-related physical debility: Secondary | ICD-10-CM | POA: Diagnosis not present

## 2022-08-17 DIAGNOSIS — I2510 Atherosclerotic heart disease of native coronary artery without angina pectoris: Secondary | ICD-10-CM | POA: Diagnosis not present

## 2022-08-17 NOTE — Progress Notes (Addendum)
ADVANCED HEART FAILURE CLINIC NOTE  Referring Physician: Loraine Leriche.,*  Primary Care: Loraine Leriche., MD Primary Cardiologist: Dr. Angelena Form, Dr. Johnsie Cancel  HPI: Pam [name] is a 72 y.o. female with chronic systolic heart failure secondary to ischemic cardiomyopathy status post ICD, coronary artery disease with CTO of the mid LAD filled with left to left and right to left collaterals presenting today to establish care.  Her cardiac history dates back to December 2017 when she presented with an NSTEMI.  At that time echocardiogram demonstrated LVEF of 35 to 40% with preserved RV function.  Coronary angiography with heavily calcified CTO of the proximal to mid LAD filled by collaterals and calcified 80% stenosis of the circumflex/OM1.  Follow-up cardiac MRI demonstrated nonviable LAD territory.  She ultimately underwent atherectomy and IVUS guided PCI of the proximal circumflex/OM1.  In November 2020 she had a reduction in her LVEF to 25 to 30% and underwent Medtronic ICD placement in early 2021.  According to [name] she did fairly well for a few months however over the past several months has once again had some progression of dyspnea and a decrease in exercise capacity.  She was admitted in September 2023 with acute on chronic CHF exacerbation.  TTE and left heart cath from admission were stable compared to prior.  She was continued on medical therapy for CAD and started on low-dose GDMT for systolic heart failure.  Since that time she has been seen in [facility] clinic where GDMT was further uptitrated.  She also underwent PFTs that were significant for reversible obstructive lung disease and emphysema.  Interval history: Ms. Stiver and her husband present today to discuss potential LVAD after RHC last week. Otherwise, she has no complaints. Taking lasix three times weekly. She is still fairly sedentary at home likely due to low output heart failure; reports to having conversational dyspnea  at times.   Activity level/exercise tolerance: NYHA III Orthopnea:  Sleeps on 2 pillows Paroxysmal noctural dyspnea: Yes but infrequent Chest pain/pressure: Stable at this time Orthostatic lightheadedness: No Palpitations: No Lower extremity edema: No, resolved Presyncope/syncope: No Cough: No  Past Medical History:  Diagnosis Date   Abnormal EKG    AICD (automatic cardioverter/defibrillator) present    Alcoholic intoxication without complication ([place])    Allergy    Cardiomyopathy, ischemic    Cataract    Chest pain 08/29/2016   Chest pain 08/29/2016   CHF (congestive heart failure) ([place])    Chronic systolic heart failure ([place]) 08/29/2016   Facial laceration    Fall 07/07/2016   Hyperlipidemia    Hypertension 03/03/2017   Hyponatremia    Hypothyroidism 07/08/2016   NSTEMI (non-ST elevated myocardial infarction) ([place])    Syncope    Thyroid disease     Current Outpatient Medications  Medication Sig Dispense Refill   acetaminophen (TYLENOL) 500 MG tablet Take 1,000 mg by mouth every 6 (six) hours as needed for moderate pain.     albuterol (VENTOLIN HFA) 108 (90 Base) MCG/ACT inhaler Inhale 2 puffs into the lungs every 6 (six) hours as needed for wheezing or shortness of breath. 8 g 6   alendronate (FOSAMAX) 70 MG tablet TAKE 1 TABLET(70 MG) BY MOUTH 1 TIME A WEEK WITH A FULL GLASS OF WATER AND ON AN EMPTY STOMACH 12 tablet 1   atorvastatin (LIPITOR) 80 MG tablet TAKE 1 TABLET(80 MG) BY MOUTH DAILY 90 tablet 1   bisoprolol (ZEBETA) 5 MG tablet Take 1 tablet (5 mg total) by  mouth daily. 30 tablet 11   Cholecalciferol (VITAMIN D3) 50 MCG (2000 UT) TABS Take 2,000 mcg by mouth daily.     clopidogrel (PLAVIX) 75 MG tablet TAKE 1 TABLET(75 MG) BY MOUTH DAILY WITH BREAKFAST 90 tablet 3   empagliflozin (JARDIANCE) 10 MG TABS tablet Take 1 tablet (10 mg total) by mouth daily. 30 tablet 11   furosemide (LASIX) 20 MG tablet Take 1 tablet (20 mg total) by mouth as needed. 30 tablet 10    levothyroxine (SYNTHROID) 25 MCG tablet Take 37.5 mcg by mouth daily before breakfast.     nystatin cream (MYCOSTATIN) Apply 1 Application topically 2 (two) times daily as needed (rash).     ranolazine (RANEXA) 1000 MG SR tablet Take 1 tablet (1,000 mg total) by mouth 2 (two) times daily. 180 tablet 3   sacubitril-valsartan (ENTRESTO) 24-26 MG Take 1 tablet by mouth 2 (two) times daily. 60 tablet 11   spironolactone (ALDACTONE) 25 MG tablet Take 1 tablet (25 mg total) by mouth daily. 90 tablet 3   umeclidinium-vilanterol (ANORO ELLIPTA) 62.5-25 MCG/ACT AEPB Inhale 1 puff into the lungs daily. 60 each 5   valACYclovir (VALTREX) 1000 MG tablet Take 1,000 mg by mouth daily.     nitroGLYCERIN (NITROSTAT) 0.4 MG SL tablet Place 1 tablet (0.4 mg total) under the tongue every 5 (five) minutes x 3 doses as needed for chest pain. (Patient not taking: Reported on 08/17/2022) 75 tablet 3   No current facility-administered medications for this encounter.    Allergies  Allergen Reactions   Lisinopril Cough    Dry cough      Social History   Socioeconomic History   Marital status: Married    Spouse name: Comptroller   Number of children: 2   Years of education: Not on file   Highest education level: High school graduate  Occupational History   Occupation: Retired-worked for post office  Tobacco Use   Smoking status: Former    Packs/day: 1.00    Years: 50.00    Total pack years: 50.00    Types: Cigarettes    Quit date: 07/07/2016    Years since quitting: 6.1   Smokeless tobacco: Never  Vaping Use   Vaping Use: Never used  Substance and Sexual Activity   Alcohol use: Not Currently   Drug use: Never   Sexual activity: Not Currently  Other Topics Concern   Not on file  Social History Narrative   Not on file   Social Determinants of Health   Financial Resource Strain: Low Risk  (04/29/2022)   Overall Financial Resource Strain (CARDIA)    Difficulty of Paying Living Expenses: Not hard  at all  Food Insecurity: No Food Insecurity (04/29/2022)   Hunger Vital Sign    Worried About Running Out of Food in the Last Year: Never true    [place] in the Last Year: Never true  Transportation Needs: No Transportation Needs (04/29/2022)   PRAPARE - Hydrologist (Medical): No    Lack of Transportation (Non-Medical): No  Physical Activity: Not on file  Stress: Not on file  Social Connections: Not on file  Intimate Partner Violence: Not on file      Family History  Problem Relation Age of Onset   Kidney failure Mother    Heart disease Mother        CHF   Heart disease Sister    Colon cancer Neg Hx    Esophageal  cancer Neg Hx    Rectal cancer Neg Hx    Stomach cancer Neg Hx     PHYSICAL EXAM: Vitals:   08/17/22 1711  BP: (!) 112/50  Pulse: 62  SpO2: 93%   GENERAL: NAD HEENT: Negative for arcus senilis or xanthelasma. There is no scleral icterus.  The mucous membranes are pink and moist.   NECK: Supple, No masses. Normal carotid upstrokes without bruits. No masses or thyromegaly.    CHEST: There are no chest wall deformities. There is no chest wall tenderness. Respirations are unlabored.  Lungs-CTA B/L; no crackles or wheezing.  CARDIAC:  JVP: <7         Normal S1, S2  Normal rate with regular rhythm. No murmurs, rubs or gallops.  Pulses are 2+ and symmetrical in upper and lower extremities.  1+ edema ABDOMEN:soft, nontender.  EXTREMITIES: Cool distal extremities, no edema.  LYMPHATIC: No axillary or supraclavicular lymphadenopathy.  NEUROLOGIC: Patient is oriented x3 with no focal or lateralizing neurologic deficits.  PSYCH: Patients affect is appropriate, there is no evidence of anxiety or depression.  SKIN: Warm and dry; no lesions or wounds.   DATA REVIEW  ECG: Normal sinus rhythm  ECHO: 04/27/22: LVEF 25%-30%, RV function normal.  06/19/19: LVEF 25%-30%, Normal RV function.   CMR:  12/17: . Normal size and thickness of  the left ventricle. Systolic function is moderately decreased (LVEF = 38%).  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 There is akinesis of the apical lateral, mid anterior, anteroseptal walls.  The LAD territory is non-viable, RCA and LCX territory is viable.  2. Normal right ventricular size, thickness and systolic function (RVEF = 54%) with no regional wall motion abnormalities.  3.  Normal biatrial size.  4.  Mild mitral and mild to moderate tricuspid regurgitation.  5. Normal size of the aortic root, ascending aorta and pulmonary artery.  6.  Normal pericardium, trivial pericardial effusion.   CATH: 04/29/22:  Prox LAD to Mid LAD lesion is 90% stenosed with 95% stenosed side branch in 1st Sept. Mid LAD to Dist LAD lesion is 100% stenosed.   Previously placed Prox Cx to Dist Cx stent of unknown type is  widely patent with 0% stenosed side branch in 1st Mrg.   Lat 1st Mrg lesion is 50% stenosed.   LV end diastolic pressure is moderately elevated.   There is no aortic valve stenosis.  08/11/22, RHC: HEMODYNAMICS: RA:                  1 mmHg (mean) RV:                  27/1 mmHg PA:                  31/5 mmHg (14 mean) PCWP:            5 mmHg (mean)                                      Estimated Fick CO/CI   2.87 L/min, 1.89 L/min/m2                                          TPG  9  mmHg                                              PVR                 3.14 Wood Units  PAPi                >4  ASSESSMENT & PLAN:  NYHA IIB-III, Stage C, Systolic heart failrue Etiology of HF: Secondary to ischemic cardiomyopathy with nonviable myocardium as reported above NYHA class / AHA Stage: NYHA III Volume status & Diuretics: Low filling pressures on RHC last week; will take lasix PRN only now.  Vasodilators: Continue Entresto 24/'26mg'$  BID.  Beta-Blocker: Continue bisoprolol MRA: Continue spironolactone Cardiometabolic: Jardiance  10 mg daily Devices therapies & Valvulopathies: Primary prevention ICD in place Advanced therapies: CPX w/ VE/VCO2 slop of 69.4 and PVO2 of 13.2; CPX consistent with moderate to severe cardiac limitation in addition to obstructive lung disease. I had a lengthy discussion with the patient today regarding advanced therapies. Her COPD, age and frailty make her a marginal LVAD candidate, however, she has no absolute contraindications to durable VAD support. RHC last week with cardiac index of 1.89L/min/m2. Today we had a very length discussion with our LVAD coordinator Tanda Rockers. We discussed all her prior testing including CPX, RHC, echocardiograms and prognosis of patients with stage D heart failure. We also discussed our LVAD outcomes and national outcomes. Ms. Navarrete and her husband will call us back with their decision in regard to pursuing LVAD evaluation.  2.  COPD -Now followed by pulmonology; started on inhalers.   3.  Coronary artery disease -Management as per primary cardiology  Jayah Balthazar Advanced Heart Failure Mechanical Circulatory Support

## 2022-08-17 NOTE — Progress Notes (Signed)
Patient presents for VAD consult in [facility] today with Dr Daniel Nones today with her husband Pam [name].   Pt recently had a RHC and CPX showing pt will need to discuss advanced therapies.  [facility] EDUCATION NOTE:                VAD evaluation consent not signed today. Pt and husband would like to go home and discuss VAD therapy before agreeing to evaluation.   VAD educational packet including "Understanding Your Options with Advanced Heart Failure", "[place] Patient Agreement for VAD Evaluation and Potential Implantation" consent, and [employer] "Heartmate 3 Left Ventricular Device (LVAD) Patient Guide", Heartmate 3 Left Ventricular Assist System Patient Education Program DVD", "[place] HM III Patient Education", "[place] Mechanical Circulatory Support Program", and "Decision Aids for Left Ventricular Assist Device" reviewed in detail and left at bedside for continued reference.   All questions answered regarding VAD implant, hospital stay, and what to expect when discharged home living with a heart pump.  Explained need for 24/7 care when pt is discharged home due to sternal precautions, adaptation to living on support, emotional support, consistent and meticulous exit site care and management, medication adherence and high volume of follow up visits with the [facility] after discharge; both pt and caregiver verbalized understanding of above.   Explained that LVAD can be implanted for two indications in the setting of advanced left ventricular heart failure treatment:  Bridge to transplant - used for patients who cannot safely wait for heart transplant without this device.  Or    Destination therapy - used for patients until end of life or recovery of heart function.  Patient and caregiver(s) acknowledge that the indication at this point in time for LVAD therapy would be for DT due to age.   Provided brief equipment overview and demonstration with HeartMate III training loop including discussion  on the following:   a) mobile power unit b) system controller   c) universal Charity fundraiser   d) battery clips   e) Batteries   f)  Perc lock   g) Percutaneous lead   Demonstrated and discussed:  a) changing power source on system controller from tethered (MPU) to untethered (battery) mode   b) changing power source on system controller from untethered (battery) to tethered (MPU) mode   c) how to monitor battery life both on the system controller and on each individual battery   d) changing batteries   Reviewed and supplied a copy of home inspection check list stressing that only three pronged grounded power outlets can be used for VAD equipment. Mrs. confirmed home has electrical outlets that will support the equipment along with access working telephone.  Identified the following lifestyle modifications while living on MCS:    1. No driving for at least three months and then only if doctor gives permission to do so.   2. No tub baths while pump implanted, and shower only when doctor gives permission.   3. No swimming or submersion in water while implanted with pump.   4. No contact sports or engaging in jumping activities.   5. Always have a backup controller, charged spare batteries, and battery clips nearby at all times in case of emergency.   6. Call the doctor or hospital contact person if any change in how the pump sounds, feels, or works.   7. Plan to sleep only when connected to the power module.   8. Do not sleep on your stomach.   9.  Keep a backup system controller, charged batteries, battery clips, and flashlight near you during sleep in case of electrical power outage.   10. Exit site care including dressing changes, monitoring for infection, and importance of keeping percutaneous lead stabilized at all times.     Extended the option to have one of our current patients and caregiver(s) come to talk with them about living on support} to assist with decision making.    Reviewed pictures of VAD drive line, site care, dressing changes, and drive line stabilization including securement attachment device and abdominal binder. Discussed with pt and family that they will be required to purchase dressing supplies as long as patient has the VAD in place.   She will also need to abide by sternal precautions with no lifting >10lbs, pushing, pulling and will need assistance with adapting to new life style with VAD equipment and care.   Intermacs patient survival statistics through June 2023 reviewed with patient and caregiver as follows:                                                  The patient understands that from this discussion it does not mean that they will receive the device, but that depends on an extensive evaluation process. The patient is aware of the fact that if at anytime they want to stop the evaluation process they can.  All questions have been answered at this time and contact information was provided should they encounter any further questions.    Mrs. Loudon will call the VAD office in the next week to let us know if she would like to move forward with the evaluation process.   Vital Signs:  HR: 62 BP: 112/50 (69) SPO2: 93 %   Weight: 114 lb w/o eqt Last weight: 112 lb   Symptom YES NO DETAILS  Angina   Activity:  Claudication   How Far:  Syncope   When:  Stroke     Orthopnea   How many pillows:  PND   How often:  CPAP   How many hours:  Pedal Edema     Abdominal Fullness     Nausea / Vomit     Diaphoresis   When:  Shortness of Breath X  Activity: w/any activity at times conversationally  Palpitations   When:  ICD shock     Bleeding S/S     Tea-colored Urine     Hospitalizations     Emergency Room     Other MD     Activity   Fluid   Diet       Device: Medtronic Therapies: On  Last check: 05/24/22   Patient Instructions:  Call the office within a week if you would like to proceed with evaluation Pt has 2 mo  f/u with DR Jonah Blue, RN VAD Coordinator    Office: [phone] 24/7 Emergency VAD Pager: [phone]

## 2022-08-22 ENCOUNTER — Telehealth (HOSPITAL_COMMUNITY): Payer: Self-pay | Admitting: Unknown Physician Specialty

## 2022-08-22 NOTE — Telephone Encounter (Signed)
Pt called to let us know that she does not wish to pursue LVAD at this time. Pt declines LVAD evaluation. We will communicate this with DR Daniel Nones. She has f/u in his clinic in March.  Tanda Rockers RN, BSN VAD Coordinator 24/7 Pager [phone]

## 2022-08-22 NOTE — Telephone Encounter (Signed)
error 

## 2022-08-23 ENCOUNTER — Other Ambulatory Visit (INDEPENDENT_AMBULATORY_CARE_PROVIDER_SITE_OTHER): Payer: Federal, State, Local not specified - PPO

## 2022-08-23 ENCOUNTER — Ambulatory Visit: Payer: Federal, State, Local not specified - PPO | Attending: Cardiology

## 2022-08-23 DIAGNOSIS — I255 Ischemic cardiomyopathy: Secondary | ICD-10-CM | POA: Diagnosis not present

## 2022-08-23 DIAGNOSIS — Z8639 Personal history of other endocrine, nutritional and metabolic disease: Secondary | ICD-10-CM

## 2022-08-23 DIAGNOSIS — E871 Hypo-osmolality and hyponatremia: Secondary | ICD-10-CM | POA: Diagnosis not present

## 2022-08-23 LAB — CUP PACEART REMOTE DEVICE CHECK
Battery Remaining Longevity: 116 mo
Battery Voltage: 3.01 V
Brady Statistic RV Percent Paced: 0.01 %
Date Time Interrogation Session: 20240123012203
HighPow Impedance: 65 Ohm
Implantable Lead Connection Status: 753985
Implantable Lead Implant Date: 20210127
Implantable Lead Location: 753860
Implantable Pulse Generator Implant Date: 20210127
Lead Channel Impedance Value: 323 Ohm
Lead Channel Impedance Value: 380 Ohm
Lead Channel Pacing Threshold Amplitude: 0.625 V
Lead Channel Pacing Threshold Pulse Width: 0.4 ms
Lead Channel Sensing Intrinsic Amplitude: 8.5 mV
Lead Channel Sensing Intrinsic Amplitude: 8.5 mV
Lead Channel Setting Pacing Amplitude: 2 V
Lead Channel Setting Pacing Pulse Width: 0.4 ms
Lead Channel Setting Sensing Sensitivity: 0.3 mV
Zone Setting Status: 755011
Zone Setting Status: 755011

## 2022-08-23 LAB — BASIC METABOLIC PANEL
BUN: 22 mg/dL (ref 6–23)
CO2: 25 mEq/L (ref 19–32)
Calcium: 10.1 mg/dL (ref 8.4–10.5)
Chloride: 98 mEq/L (ref 96–112)
Creatinine, Ser: 1.07 mg/dL (ref 0.40–1.20)
GFR: 52.41 mL/min — ABNORMAL LOW (ref >60.00)
Glucose, Bld: 92 mg/dL (ref 70–99)
Potassium: 4.8 mEq/L (ref 3.5–5.1)
Sodium: 131 mEq/L — ABNORMAL LOW (ref 135–145)

## 2022-08-30 ENCOUNTER — Encounter: Payer: Self-pay | Admitting: Endocrinology

## 2022-08-30 ENCOUNTER — Ambulatory Visit: Payer: Federal, State, Local not specified - PPO | Admitting: Endocrinology

## 2022-08-30 VITALS — BP 96/72 | HR 94 | Ht 63.0 in | Wt 113.2 lb

## 2022-08-30 DIAGNOSIS — E871 Hypo-osmolality and hyponatremia: Secondary | ICD-10-CM | POA: Diagnosis not present

## 2022-08-30 DIAGNOSIS — M810 Age-related osteoporosis without current pathological fracture: Secondary | ICD-10-CM

## 2022-08-30 DIAGNOSIS — E039 Hypothyroidism, unspecified: Secondary | ICD-10-CM | POA: Diagnosis not present

## 2022-08-30 NOTE — Progress Notes (Unsigned)
Patient ID: Pam [name], female   DOB: [birth year], 72 y.o.   MRN: [id number]                  Chief complaint: Endocrinology follow-up  History of Present Illness:    History of hyperparathyroidism: OSTEOPOROSIS:  Screening bone density showed osteoporosis at the hip and radius as of 07/2019  Osteoporosis management probably related to hyperparathyroidism Parathyroidectomy was done on 02/02/2021  Since then she has been on alendronate 70 mg weekly  She has been regular with taking this on empty stomach weekly with a glass of water She does not report any side effects   Bone density results show improvement as of 03/2022:   Lumbar spine L1-L2 (L3,L 4) Femoral neck (FN) 33% distal radius Ultra distal radius   T-score -0.8 RFN: -2.6 LFN: -2.2 -2.1 -2.9  Change in BMD from previous DXA test (%) +4.0% +3.8% -6.2% N/a   07/2019 Lumbar spine L1-L3 (L4) Femoral neck (FN) 33% distal radius Ultra distal radius  T-score -1.0 RFN: -2.8 LFN: -2.4  -1.6  -2.7    Also on  vitamin D, 2000 units daily as before  25 (OH) Vitamin D level as follows:  Lab Results  Component Value Date   VD25OH 55.31 02/15/2022   VD25OH 25.77 (L) 08/17/2021   Calcium is continuing to be normal  Lab Results  Component Value Date   CALCIUM 10.1 08/23/2022    HYPONATREMIA: See review of systems   Allergies as of 08/30/2022       Reactions   Lisinopril Cough   Dry cough        Medication List        Accurate as of August 30, 2022 11:23 AM. If you have any questions, ask your nurse or doctor.          acetaminophen 500 MG tablet Commonly known as: TYLENOL Take 1,000 mg by mouth every 6 (six) hours as needed for moderate pain.   albuterol 108 (90 Base) MCG/ACT inhaler Commonly known as: VENTOLIN HFA Inhale 2 puffs into the lungs every 6 (six) hours as needed for wheezing or shortness of breath.   alendronate 70 MG tablet Commonly known as: FOSAMAX TAKE 1 TABLET(70 MG) BY MOUTH 1  TIME A WEEK WITH A FULL GLASS OF WATER AND ON AN EMPTY STOMACH   Anoro Ellipta 62.5-25 MCG/ACT Aepb Generic drug: umeclidinium-vilanterol Inhale 1 puff into the lungs daily.   atorvastatin 80 MG tablet Commonly known as: LIPITOR TAKE 1 TABLET(80 MG) BY MOUTH DAILY   bisoprolol 5 MG tablet Commonly known as: ZEBETA Take 1 tablet (5 mg total) by mouth daily.   clopidogrel 75 MG tablet Commonly known as: PLAVIX TAKE 1 TABLET(75 MG) BY MOUTH DAILY WITH BREAKFAST   empagliflozin 10 MG Tabs tablet Commonly known as: JARDIANCE Take 1 tablet (10 mg total) by mouth daily.   Entresto 24-26 MG Generic drug: sacubitril-valsartan Take 1 tablet by mouth 2 (two) times daily.   furosemide 20 MG tablet Commonly known as: LASIX Take 1 tablet (20 mg total) by mouth as needed.   levothyroxine 25 MCG tablet Commonly known as: SYNTHROID Take 37.5 mcg by mouth daily before breakfast.   nitroGLYCERIN 0.4 MG SL tablet Commonly known as: NITROSTAT Place 1 tablet (0.4 mg total) under the tongue every 5 (five) minutes x 3 doses as needed for chest pain.   nystatin cream Commonly known as: MYCOSTATIN Apply 1 Application topically 2 (two) times daily as needed (rash).  ranolazine 1000 MG SR tablet Commonly known as: RANEXA Take 1 tablet (1,000 mg total) by mouth 2 (two) times daily.   spironolactone 25 MG tablet Commonly known as: ALDACTONE Take 1 tablet (25 mg total) by mouth daily.   valACYclovir 1000 MG tablet Commonly known as: VALTREX Take 1,000 mg by mouth daily.   Vitamin D3 50 MCG (2000 UT) Tabs Take 2,000 mcg by mouth daily.        Allergies:  Allergies  Allergen Reactions   Lisinopril Cough    Dry cough    Past Medical History:  Diagnosis Date   Abnormal EKG    AICD (automatic cardioverter/defibrillator) present    Alcoholic intoxication without complication ([place])    Allergy    Cardiomyopathy, ischemic    Cataract    Chest pain 08/29/2016   Chest pain  08/29/2016   CHF (congestive heart failure) ([place])    Chronic systolic heart failure ([place]) 08/29/2016   Facial laceration    Fall 07/07/2016   Hyperlipidemia    Hypertension 03/03/2017   Hyponatremia    Hypothyroidism 07/08/2016   NSTEMI (non-ST elevated myocardial infarction) [facility])    Syncope    Thyroid disease     Past Surgical History:  Procedure Laterality Date   CARDIAC CATHETERIZATION N/A 07/08/2016   Procedure: Left Heart Cath and Coronary Angiography;  Surgeon: [name], MD;  Location: [place] [facility];  Service: Cardiovascular;  Laterality: N/A;   CARDIAC CATHETERIZATION N/A 08/29/2016   Procedure: Coronary Atherectomy;  Surgeon: [name], MD;  Location: [place] [facility];  Service: Cardiovascular;  Laterality: N/A;   CARDIAC CATHETERIZATION N/A 08/29/2016   Procedure: Coronary Stent Intervention;  Surgeon: [name], MD;  Location: [place] [facility];  Service: Cardiovascular;  Laterality: N/A;   CATARACT EXTRACTION, BILATERAL     COLONOSCOPY WITH PROPOFOL N/A 11/18/2021   Procedure: COLONOSCOPY WITH PROPOFOL;  Surgeon: Thornton Park, MD;  [facility];  Service: Gastroenterology;  Laterality: N/A;   CORONARY ANGIOPLASTY     coronary stents      ICD IMPLANT N/A 08/28/2019   Procedure: ICD IMPLANT;  Surgeon: Constance Haw, MD;  Location: [place] [facility];  Service: Cardiovascular;  Laterality: N/A;   LEFT HEART CATH AND CORONARY ANGIOGRAPHY N/A 04/29/2022   Procedure: LEFT HEART CATH AND CORONARY ANGIOGRAPHY;  Surgeon: Leonie Man, MD;  Location: [employer] [facility];  Service: Cardiovascular;  Laterality: N/A;   PARATHYROIDECTOMY Right 02/02/2021   Procedure: RIGHT SUPERIOR PARATHYROIDECTOMY;  Surgeon: Armandina Gemma, MD;  Location: [facility];  Service: General;  Laterality: Right;   POLYPECTOMY  11/18/2021   Procedure: POLYPECTOMY;  Surgeon: Thornton Park, MD;  [facility];  Service: Gastroenterology;;   RIGHT HEART  CATH N/A 08/11/2022   Procedure: RIGHT HEART CATH;  Surgeon: Hebert Soho, DO;  Location: [place] [facility];  Service: Cardiovascular;  Laterality: N/A;   VAGINAL HYSTERECTOMY      Family History  Problem Relation Age of Onset   Kidney failure Mother    Heart disease Mother        CHF   Heart disease Sister    Colon cancer Neg Hx    Esophageal cancer Neg Hx    Rectal cancer Neg Hx    Stomach cancer Neg Hx     Social History:  reports that she quit smoking about 6 years ago. Her smoking use included cigarettes. She has a 50.00 pack-year smoking history. She has never used smokeless tobacco. She  reports that she does not currently use alcohol. She reports that she does not use drugs.  Review of Systems  She has mild hypothyroidism, apparently asymptomatic at diagnosis She has been prescribed levothyroxine 37.5 mcg daily by her PCP  Last TSH:  Lab Results  Component Value Date   TSH 3.46 08/17/2021   TSH 2.97 11/18/2020   TSH 3.21 09/21/2020   FREET4 0.91 08/17/2021   FREET4 0.95 09/21/2020    HYPONATREMIA: She has had long-term hyponatremia of unclear etiology, not on thiazide diuretics except Lasix Also not on any other drugs causing hyponatremia Previously urine osmolality was relatively high at 332 and urine sodium last 39  Her sodium has been as low as 127 Has been advised to be restricting her fluids and cut back drinking fluids including water, coffee, juices and occasional diet drinks  Sodium is only slightly low at 131 Also had recent admission for CHF  She has had upper normal potassium levels at times  Wt Readings from Last 3 Encounters:  08/30/22 113 lb 3.2 oz (51.3 kg)  08/17/22 114 lb (51.7 kg)  08/11/22 112 lb (50.8 kg)   Lab Results  Component Value Date   NA 131 (L) 08/23/2022   K 4.8 08/23/2022   CL 98 08/23/2022   CO2 25 08/23/2022     EXAM:  BP 96/72 (BP Location: Left Arm, Patient Position: Sitting, Cuff Size: Normal)   Pulse 94    Ht '5\' 3"'$  (1.6 m)   Wt 113 lb 3.2 oz (51.3 kg)   BMI 20.05 kg/m   No ankle edema  Assessment/Plan:    Longstanding hyponatremia:   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   Elayne Snare 08/30/2022, 11:23 AM   11/26/20:

## 2022-09-12 ENCOUNTER — Encounter: Payer: Self-pay | Admitting: Cardiology

## 2022-09-12 ENCOUNTER — Ambulatory Visit: Payer: Federal, State, Local not specified - PPO | Attending: Cardiology | Admitting: Cardiology

## 2022-09-12 VITALS — BP 100/54 | HR 56 | Ht 63.0 in | Wt 112.0 lb

## 2022-09-12 DIAGNOSIS — I10 Essential (primary) hypertension: Secondary | ICD-10-CM

## 2022-09-12 DIAGNOSIS — I2510 Atherosclerotic heart disease of native coronary artery without angina pectoris: Secondary | ICD-10-CM | POA: Diagnosis not present

## 2022-09-12 DIAGNOSIS — I5022 Chronic systolic (congestive) heart failure: Secondary | ICD-10-CM | POA: Diagnosis not present

## 2022-09-12 NOTE — Progress Notes (Signed)
Electrophysiology Office Note   Date:  09/12/2022   ID:  Rissie Perrin, DOB 1950/08/16, MRN [id number]  PCP:  Loraine Leriche., MD  Cardiologist:  Angelena Form Primary Electrophysiologist:  Lorance Pickeral Meredith Leeds, MD    Chief Complaint: CHF   History of Present Illness: Shakia Bruch is a 72 y.o. female who is being seen today for the evaluation of CHF at the request of Theodis Sato, Gretchen Y.,*. Presenting today for electrophysiology evaluation.   Seen for coronary artery disease, chronic systolic heart failure, hypertension, hyperlipidemia.  She had non-STEMI December 2017 and was found to have a chronic total occlusion of the LAD.  She had severe calcific stenosis of the circumflex.  Cardiac MRI showed nonviability of the LAD territory.  She is status post Medtronic ICD implanted 08/28/2019.  She was admitted to the hospital September 2023 with acute on chronic heart failure exacerbation.  She had a recent right heart catheterization that showed severely reduced cardiac output and thus LVAD was recommended.  Today, denies symptoms of palpitations, chest pain, shortness of breath, orthopnea, PND, lower extremity edema, claudication, dizziness, presyncope, syncope, bleeding, or neurologic sequela. The patient is tolerating medications without difficulties.  She continues to have restrictions in her daily life.  She is NYHA class III.  She states that she is quite anxious about the possibility of having an LVAD and she has potentially decided that she does not want one implanted.    Past Medical History:  Diagnosis Date   Abnormal EKG    AICD (automatic cardioverter/defibrillator) present    Alcoholic intoxication without complication ([place])    Allergy    Cardiomyopathy, ischemic    Cataract    Chest pain 08/29/2016   Chest pain 08/29/2016   CHF (congestive heart failure) ([place])    Chronic systolic heart failure ([place]) 08/29/2016   Facial laceration    Fall 07/07/2016   Hyperlipidemia     Hypertension 03/03/2017   Hyponatremia    Hypothyroidism 07/08/2016   NSTEMI (non-ST elevated myocardial infarction) [facility])    Syncope    Thyroid disease    Past Surgical History:  Procedure Laterality Date   CARDIAC CATHETERIZATION N/A 07/08/2016   Procedure: Left Heart Cath and Coronary Angiography;  Surgeon: [name], MD;  Location: [place] [facility];  Service: Cardiovascular;  Laterality: N/A;   CARDIAC CATHETERIZATION N/A 08/29/2016   Procedure: Coronary Atherectomy;  Surgeon: [name], MD;  Location: [place] [facility];  Service: Cardiovascular;  Laterality: N/A;   CARDIAC CATHETERIZATION N/A 08/29/2016   Procedure: Coronary Stent Intervention;  Surgeon: [name], MD;  Location: [place] [facility];  Service: Cardiovascular;  Laterality: N/A;   CATARACT EXTRACTION, BILATERAL     COLONOSCOPY WITH PROPOFOL N/A 11/18/2021   Procedure: COLONOSCOPY WITH PROPOFOL;  Surgeon: Thornton Park, MD;  [facility];  Service: Gastroenterology;  Laterality: N/A;   CORONARY ANGIOPLASTY     coronary stents      ICD IMPLANT N/A 08/28/2019   Procedure: ICD IMPLANT;  Surgeon: Constance Haw, MD;  Location: [place] [facility];  Service: Cardiovascular;  Laterality: N/A;   LEFT HEART CATH AND CORONARY ANGIOGRAPHY N/A 04/29/2022   Procedure: LEFT HEART CATH AND CORONARY ANGIOGRAPHY;  Surgeon: Leonie Man, MD;  Location: [place] [facility];  Service: Cardiovascular;  Laterality: N/A;   PARATHYROIDECTOMY Right 02/02/2021   Procedure: RIGHT SUPERIOR PARATHYROIDECTOMY;  Surgeon: Armandina Gemma, MD;  [facility];  Service: General;  Laterality: Right;   POLYPECTOMY  11/18/2021  Procedure: POLYPECTOMY;  Surgeon: Thornton Park, MD;  Location: [facility];  Service: Gastroenterology;;   RIGHT HEART CATH N/A 08/11/2022   Procedure: RIGHT HEART CATH;  Surgeon: Hebert Soho, DO;  Location: [place] [facility];  Service: Cardiovascular;  Laterality: N/A;    VAGINAL HYSTERECTOMY       Current Outpatient Medications  Medication Sig Dispense Refill   acetaminophen (TYLENOL) 500 MG tablet Take 1,000 mg by mouth every 6 (six) hours as needed for moderate pain.     albuterol (VENTOLIN HFA) 108 (90 Base) MCG/ACT inhaler Inhale 2 puffs into the lungs every 6 (six) hours as needed for wheezing or shortness of breath. 8 g 6   alendronate (FOSAMAX) 70 MG tablet TAKE 1 TABLET(70 MG) BY MOUTH 1 TIME A WEEK WITH A FULL GLASS OF WATER AND ON AN EMPTY STOMACH 12 tablet 1   atorvastatin (LIPITOR) 80 MG tablet TAKE 1 TABLET(80 MG) BY MOUTH DAILY 90 tablet 1   bisoprolol (ZEBETA) 5 MG tablet Take 1 tablet (5 mg total) by mouth daily. 30 tablet 11   Cholecalciferol (VITAMIN D3) 50 MCG (2000 UT) TABS Take 2,000 mcg by mouth daily.     clopidogrel (PLAVIX) 75 MG tablet TAKE 1 TABLET(75 MG) BY MOUTH DAILY WITH BREAKFAST 90 tablet 3   empagliflozin (JARDIANCE) 10 MG TABS tablet Take 1 tablet (10 mg total) by mouth daily. 30 tablet 11   furosemide (LASIX) 20 MG tablet Take 1 tablet (20 mg total) by mouth as needed. 30 tablet 10   levothyroxine (SYNTHROID) 25 MCG tablet Take 37.5 mcg by mouth daily before breakfast.     nitroGLYCERIN (NITROSTAT) 0.4 MG SL tablet Place 1 tablet (0.4 mg total) under the tongue every 5 (five) minutes x 3 doses as needed for chest pain. 75 tablet 3   nystatin cream (MYCOSTATIN) Apply 1 Application topically 2 (two) times daily as needed (rash).     ranolazine (RANEXA) 1000 MG SR tablet Take 1 tablet (1,000 mg total) by mouth 2 (two) times daily. 180 tablet 3   sacubitril-valsartan (ENTRESTO) 24-26 MG Take 1 tablet by mouth 2 (two) times daily. 60 tablet 11   spironolactone (ALDACTONE) 25 MG tablet Take 1 tablet (25 mg total) by mouth daily. 90 tablet 3   umeclidinium-vilanterol (ANORO ELLIPTA) 62.5-25 MCG/ACT AEPB Inhale 1 puff into the lungs daily. 60 each 5   valACYclovir (VALTREX) 1000 MG tablet Take 1,000 mg by mouth daily.     No  current facility-administered medications for this visit.    Allergies:   Lisinopril   Social History:  The patient  reports that she quit smoking about 6 years ago. Her smoking use included cigarettes. She has a 50.00 pack-year smoking history. She has never used smokeless tobacco. She reports that she does not currently use alcohol. She reports that she does not use drugs.   Family History:  The patient's family history includes Heart disease in her mother and sister; Kidney failure in her mother.   ROS:  Please see the history of present illness.   Otherwise, review of systems is positive for none.   All other systems are reviewed and negative.   PHYSICAL EXAM: VS:  BP (!) 100/54   Pulse (!) 56   Ht 5' 3"$  (1.6 m)   Wt 112 lb (50.8 kg)   SpO2 98%   BMI 19.84 kg/m  , BMI Body mass index is 19.84 kg/m. GEN: Well nourished, well developed, in no acute distress  HEENT: normal  Neck: no JVD, carotid bruits, or masses Cardiac: RRR; no murmurs, rubs, or gallops,no edema  Respiratory:  clear to auscultation bilaterally, normal work of breathing GI: soft, nontender, nondistended, + BS MS: no deformity or atrophy  Skin: warm and dry, device site well healed Neuro:  Strength and sensation are intact Psych: euthymic mood, full affect  EKG:  EKG is ordered today. Personal review of the ekg ordered shows sinus rhythm, rate 56  Personal review of the device interrogation today. Results in [place]: 04/26/2022: Magnesium 1.8 05/31/2022: ALT 19 08/05/2022: B Natriuretic Peptide 460.3 08/11/2022: Hemoglobin 12.2; Hemoglobin 12.2; Platelets 406 08/23/2022: BUN 22; Creatinine, Ser 1.07; Potassium 4.8; Sodium 131    Lipid Panel     Component Value Date/Time   CHOL 153 03/01/2018 0801   TRIG 41 03/01/2018 0801   HDL 93 03/01/2018 0801   CHOLHDL 1.6 03/01/2018 0801   CHOLHDL 2.0 07/07/2016 1914   VLDL 17 07/07/2016 1914   LDLCALC 52 03/01/2018 0801     Wt Readings from  Last 3 Encounters:  09/12/22 112 lb (50.8 kg)  08/30/22 113 lb 3.2 oz (51.3 kg)  08/17/22 114 lb (51.7 kg)      Other studies Reviewed: Additional studies/ records that were reviewed today include: TTE 06/19/19  Review of the above records today demonstrates:   1. Left ventricular ejection fraction, by visual estimation, is 25 to 30%. The left ventricle has severely decreased function. Left ventricular septal wall thickness was normal. There is no left ventricular hypertrophy.  2. Severely dilated left ventricular internal cavity size.  3. Mid and apical segments severely hypokinetic Preserved basal funciton.  4. Global right ventricle has normal systolic function.The right ventricular size is normal. No increase in right ventricular wall thickness.  5. Left atrial size was mildly dilated.  6. Right atrial size was normal.  7. The mitral valve is normal in structure. Mild mitral valve regurgitation. No evidence of mitral stenosis.  8. The tricuspid valve is normal in structure. Tricuspid valve regurgitation is not demonstrated.  9. The aortic valve is normal in structure. Aortic valve regurgitation is not visualized. No evidence of aortic valve sclerosis or stenosis. 10. The pulmonic valve was normal in structure. Pulmonic valve regurgitation is not visualized. 11. Mildly elevated pulmonary artery systolic pressure. 12. The inferior vena cava is normal in size with greater than 50% respiratory variability, suggesting right atrial pressure of 3 mmHg.   ASSESSMENT AND PLAN:  1.  Chronic systolic heart failure: NYHA class III.  Due to ischemic cardiomyopathy.  Currently on Entresto, bisoprolol, Aldactone, Jardiance.  Status post Medtronic ICD implanted 08/28/2019.  Device functioning appropriately.  No changes.  Have quite a few questions regarding her worsening heart failure and low output state.  She has been recommended to get an LVAD.  I have deferred most of this discussion to heart  failure cardiology, though did tell her that LVAD would likely make her feel better and that it is worth further consideration.  2.  Coronary disease: Currently on Plavix, beta-blocker, statin, Ranexa.  No current chest pain.  Plan per primary cardiology.  3.  Hyperlipidemia: Continue Zetia and statin per primary cardiology  4.  Hypertension: Currently well-controlled   Current medicines are reviewed at length with the patient today.   The patient does not have concerns regarding her medicines.  The following changes were made today: None  Labs/ tests ordered today include:  Orders Placed This Encounter  Procedures  EKG 12-Lead    Disposition:   FU 6 months  Signed, Sequoyah Ramone Meredith Leeds, MD  09/12/2022 2:37 PM     [address] [place] [address] [phone] (office) [phone] (fax)

## 2022-09-12 NOTE — Patient Instructions (Signed)
Medication Instructions:  °Your physician recommends that you continue on your current medications as directed. Please refer to the Current Medication list given to you today. ° °*If you need a refill on your cardiac medications before your next appointment, please call your pharmacy* ° ° °Lab Work: °None ordered ° ° °Testing/Procedures: °None ordered ° ° °Follow-Up: °At [facility], you and your health needs are our priority.  As part of our continuing mission to provide you with exceptional heart care, we have created designated Provider Care Teams.  These Care Teams include your primary Cardiologist (physician) and Advanced Practice Providers (APPs -  Physician Assistants and Nurse Practitioners) who all work together to provide you with the care you need, when you need it. ° °Your next appointment:   °6 month(s) ° °The format for your next appointment:   °In Person ° °Provider:   °Will Camnitz, MD ° ° ° °Thank you for choosing [facility]!! ° ° °Deijah Spikes, RN °[phone] °  °

## 2022-09-14 ENCOUNTER — Encounter: Payer: Self-pay | Admitting: Pulmonary Disease

## 2022-09-14 ENCOUNTER — Ambulatory Visit: Payer: Federal, State, Local not specified - PPO | Admitting: Pulmonary Disease

## 2022-09-14 VITALS — BP 112/70 | HR 68 | Ht 63.0 in | Wt 114.0 lb

## 2022-09-14 DIAGNOSIS — J432 Centrilobular emphysema: Secondary | ICD-10-CM | POA: Diagnosis not present

## 2022-09-14 NOTE — Progress Notes (Signed)
Synopsis: Referred in November 2023 for COPD/Emphysema by Hebert Soho, DO  Subjective:   PATIENT ID: Pam [name] GENDER: female DOB: 06-06-1951, MRN: [id number]   HPI  Chief Complaint  Patient presents with   Follow-up    3 mo f/u. States the Jearl Klinefelter is still working for her.    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   She has done well on Anoro ellipta 1 puff daily. Denies any issues at this time.   Initial OV 06/21/22 PFTs 05/23/22 showed mild obstructive defect with significant bronchodilator response, air trapping, and moderate diffusion defect.   CTA Chest 04/26/22 showed severe emphysematous changes, diffuse interlobular septal thickening greatest in the lower lobes, bronchial wall thickening in the lower lobes.   She does have exertional dyspnea which she needs to take breaks. No wheezing. Some coughing. Some phlegm production. She does report orthopnea which is better. She denies snoring. No witnessed apnea events per husband.  She was admitted for respiratory failure 9/27 to 10/2 due to pneumonia and heart failure.   She quit smoking 7 years ago. She smoked for 20-30 years, pack per day. She did have second hand smoke exposure in childhood. She is retired, she is a former Programme researcher, broadcasting/film/video. She lives with her husband who smokes.    Past Medical History:  Diagnosis Date   Abnormal EKG    AICD (automatic cardioverter/defibrillator) present    Alcoholic intoxication without complication (HCC)    Allergy    Cardiomyopathy, ischemic    Cataract    Chest pain 08/29/2016   Chest pain 08/29/2016   CHF (congestive heart failure) ([place])    Chronic systolic heart failure ([place]) 08/29/2016   Facial laceration    Fall 07/07/2016   Hyperlipidemia    Hypertension 03/03/2017   Hyponatremia    Hypothyroidism 07/08/2016   NSTEMI (non-ST elevated  myocardial infarction) ([place])    Syncope    Thyroid disease      Family History  Problem Relation Age of Onset   Kidney failure Mother    Heart disease Mother        CHF   Heart disease Sister    Colon cancer Neg Hx    Esophageal cancer Neg Hx    Rectal cancer Neg Hx    Stomach cancer Neg Hx      Social History   Socioeconomic History   Marital status: Married    Spouse name: Comptroller   Number of children: 2   Years of education: Not on file   Highest education level: High school graduate  Occupational History   Occupation: Retired-worked for post office  Tobacco Use   Smoking status: Former    Packs/day: 1.00    Years: 50.00    Total pack years: 50.00    Types: Cigarettes    Quit date: 07/07/2016    Years since quitting: 6.2   Smokeless tobacco: Never  Vaping Use   Vaping Use: Never used  Substance and Sexual Activity   Alcohol use: Not Currently   Drug use: Never   Sexual activity: Not Currently  Other Topics Concern   Not on file  Social History Narrative   Not on file   Social Determinants of Health   Financial Resource Strain: Low Risk  (04/29/2022)   Overall Financial Resource Strain (CARDIA)    Difficulty of Paying Living Expenses: Not hard at all  Food Insecurity: No Food Insecurity (04/29/2022)   Hunger Vital Sign    Worried About Running Out of Food in the Last Year: Never true    Ran Out of Food in the Last Year: Never true  Transportation Needs: No Transportation Needs (04/29/2022)   PRAPARE - Hydrologist (Medical): No    Lack of Transportation (Non-Medical): No  Physical Activity: Not on file  Stress: Not on file  Social Connections: Not on file  Intimate Partner Violence: Not on file     Allergies  Allergen Reactions   Lisinopril Cough    Dry cough     Outpatient Medications Prior to Visit  Medication Sig Dispense Refill   acetaminophen (TYLENOL) 500 MG tablet Take 1,000 mg by mouth every 6 (six) hours as  needed for moderate pain.     albuterol (VENTOLIN HFA) 108 (90 Base) MCG/ACT inhaler Inhale 2 puffs into the lungs every 6 (six) hours as needed for wheezing or shortness of breath. 8 g 6   alendronate (FOSAMAX) 70 MG tablet TAKE 1 TABLET(70 MG) BY MOUTH 1 TIME A WEEK WITH A FULL GLASS OF WATER AND ON AN EMPTY STOMACH 12 tablet 1   atorvastatin (LIPITOR) 80 MG tablet TAKE 1 TABLET(80 MG) BY MOUTH DAILY 90 tablet 1   bisoprolol (ZEBETA) 5 MG tablet Take 1 tablet (5 mg total) by mouth daily. 30 tablet 11   Cholecalciferol (VITAMIN D3) 50 MCG (2000 UT) TABS Take 2,000 mcg by mouth daily.     clopidogrel (PLAVIX) 75 MG tablet TAKE 1 TABLET(75 MG) BY MOUTH DAILY WITH BREAKFAST 90 tablet 3   empagliflozin (JARDIANCE) 10 MG TABS tablet Take 1 tablet (10 mg total) by mouth daily. 30 tablet 11   furosemide (LASIX) 20 MG tablet Take 1 tablet (20 mg total) by mouth as needed. 30 tablet 10   levothyroxine (SYNTHROID) 25 MCG tablet Take 37.5 mcg by mouth daily before breakfast.     nitroGLYCERIN (NITROSTAT) 0.4 MG SL tablet Place 1 tablet (0.4 mg total) under the tongue every 5 (five) minutes x 3 doses as needed for chest pain. 75 tablet 3   nystatin cream (MYCOSTATIN) Apply 1 Application topically 2 (two) times daily as needed (rash).     ranolazine (RANEXA) 1000 MG SR tablet Take 1 tablet (1,000 mg total) by mouth 2 (two) times daily. 180 tablet 3   sacubitril-valsartan (ENTRESTO) 24-26 MG Take 1 tablet by mouth 2 (two) times daily. 60 tablet 11   spironolactone (ALDACTONE) 25 MG tablet Take 1 tablet (25 mg total) by mouth daily. 90 tablet 3   umeclidinium-vilanterol (ANORO ELLIPTA) 62.5-25 MCG/ACT AEPB Inhale 1 puff into the lungs daily. 60 each 5   valACYclovir (VALTREX) 1000 MG tablet Take 1,000 mg by mouth daily.     No facility-administered medications prior to visit.   Review of Systems  Constitutional:  Negative for chills, fever, malaise/fatigue and weight loss.  HENT:  Negative for congestion,  sinus pain and sore throat.   Eyes: Negative.   Respiratory:  Positive for shortness of breath. Negative for cough, hemoptysis, sputum production and wheezing.   Cardiovascular:  Negative for chest pain, palpitations, orthopnea, claudication and leg swelling.  Gastrointestinal:  Negative for abdominal pain, heartburn, nausea and vomiting.  Genitourinary: Negative.   Musculoskeletal:  Negative for joint pain and myalgias.  Skin:  Negative for rash.  Neurological:  Negative for weakness.  Endo/Heme/Allergies: Negative.   Psychiatric/Behavioral: Negative.     Objective:  Vitals:   09/14/22 0959  BP: 112/70  Pulse: 68  SpO2: 96%  Weight: 114 lb (51.7 kg)  Height: '5\' 3"'$  (1.6 m)   Physical Exam Constitutional:      General: She is not in acute distress.    Appearance: She is not ill-appearing.  HENT:     Head: Normocephalic and atraumatic.  Eyes:     General: No scleral icterus.    Conjunctiva/sclera: Conjunctivae normal.     Pupils: Pupils are equal, round, and reactive to light.  Cardiovascular:     Rate and Rhythm: Normal rate and regular rhythm.     Pulses: Normal pulses.     Heart sounds: Normal heart sounds. No murmur heard. Pulmonary:     Effort: Pulmonary effort is normal.     Breath sounds: Normal breath sounds. No wheezing, rhonchi or rales.  Abdominal:     General: Bowel sounds are normal.     Palpations: Abdomen is soft.  Musculoskeletal:     Right lower leg: No edema.     Left lower leg: No edema.  Skin:    General: Skin is [name] and dry.  Neurological:     General: No focal deficit present.     Mental Status: She is alert.    CBC    Component Value Date/Time   WBC 7.7 08/11/2022 1036   RBC 4.14 08/11/2022 1036   HGB 12.2 08/11/2022 1218   HGB 12.2 08/11/2022 1218   HGB 13.6 08/19/2019 0830   HCT 36.0 08/11/2022 1218   HCT 36.0 08/11/2022 1218   HCT 39.0 08/19/2019 0830   PLT 406 (H) 08/11/2022 1036   PLT 333 08/19/2019 0830   MCV 90.1  08/11/2022 1036   MCV 88 08/19/2019 0830   MCH 29.7 08/11/2022 1036   MCHC 33.0 08/11/2022 1036   RDW 16.5 (H) 08/11/2022 1036   RDW 14.1 08/19/2019 0830   LYMPHSABS 2.6 04/26/2022 1555   LYMPHSABS 2.9 08/25/2016 1648   MONOABS 1.3 (H) 04/26/2022 1555   EOSABS 0.1 04/26/2022 1555   EOSABS 0.2 08/25/2016 1648   BASOSABS 0.1 04/26/2022 1555   BASOSABS 0.0 08/25/2016 1648      Latest Ref Rng & Units 08/23/2022    9:12 AM 08/11/2022   12:18 PM 08/05/2022   10:54 AM  BMP  Glucose 70 - 99 mg/dL 92   93   BUN 6 - 23 mg/dL 22   17   Creatinine 0.40 - 1.20 mg/dL 1.07   1.12   Sodium 135 - 145 mEq/L 131  133    132  130   Potassium 3.5 - 5.1 mEq/L 4.8  4.6    4.6  4.9   Chloride 96 - 112 mEq/L 98   98   CO2 19 - 32 mEq/L 25   22   Calcium 8.4 - 10.5 mg/dL 10.1   9.3    Chest imaging: CTA Chest 04/23/22 1. No acute pulmonary embolism. 2. Interlobular septal thickening with more confluent opacities in the lower lungs. This is favored to represent infection superimposed on a background of advanced emphysema though pulmonary edema could be considered. 3. Aortic Atherosclerosis (ICD10-I70.0) and Emphysema (ICD10-J43.9). 4. Coronary artery disease/stenting. 5. Small pericardial effusion.  PFT:    Latest Ref Rng & Units 05/30/2022    8:37 AM  PFT Results  FVC-Pre L 2.88   FVC-Predicted Pre % 100   FVC-Post L 3.29   FVC-Predicted Post % 114   Pre FEV1/FVC % % 54  Post FEV1/FCV % % 54   FEV1-Pre L 1.56   FEV1-Predicted Pre % 71   FEV1-Post L 1.77   DLCO uncorrected ml/min/mmHg 8.01   DLCO UNC% % 42   DLVA Predicted % 42   TLC L 6.08   TLC % Predicted % 124   RV % Predicted % 140    Labs:  Path:  Echo 04/27/22: LV EF 25-30%. Grade II diastolic dysfunction. RV systolic function is normal. RV size is normal. Mildly elevated PA pressure.   Heart Catheterization:  Assessment & Plan:   Centrilobular emphysema (HCC)  Discussion: Pam [name] is a 72 year old woman, former  smoker with HFrEF, coronary artery disease, hypertension and hypothyroidism who returns to pulmonary clinic for COPD/emphysema.   She has significant emphysema noted on CT Chest imaging and mild osbtructive defect on PFTs with significant bronchodilator response, evidence of air trapping and moderate diffusion defect.   She is to continue anoro ellipta 1 puff daily and as needed albuterol inhaler.  She is to continue lung cancer screening with our office and referral has been placed, she will be due for follow up [facility] CT Chest in May 2024.  Follow up in 6 months.   Freda Jackson, MD [facility] Office: [phone]   Current Outpatient Medications:    acetaminophen (TYLENOL) 500 MG tablet, Take 1,000 mg by mouth every 6 (six) hours as needed for moderate pain., Disp: , Rfl:    albuterol (VENTOLIN HFA) 108 (90 Base) MCG/ACT inhaler, Inhale 2 puffs into the lungs every 6 (six) hours as needed for wheezing or shortness of breath., Disp: 8 g, Rfl: 6   alendronate (FOSAMAX) 70 MG tablet, TAKE 1 TABLET(70 MG) BY MOUTH 1 TIME A WEEK WITH A FULL GLASS OF WATER AND ON AN EMPTY STOMACH, Disp: 12 tablet, Rfl: 1   atorvastatin (LIPITOR) 80 MG tablet, TAKE 1 TABLET(80 MG) BY MOUTH DAILY, Disp: 90 tablet, Rfl: 1   bisoprolol (ZEBETA) 5 MG tablet, Take 1 tablet (5 mg total) by mouth daily., Disp: 30 tablet, Rfl: 11   Cholecalciferol (VITAMIN D3) 50 MCG (2000 UT) TABS, Take 2,000 mcg by mouth daily., Disp: , Rfl:    clopidogrel (PLAVIX) 75 MG tablet, TAKE 1 TABLET(75 MG) BY MOUTH DAILY WITH BREAKFAST, Disp: 90 tablet, Rfl: 3   empagliflozin (JARDIANCE) 10 MG TABS tablet, Take 1 tablet (10 mg total) by mouth daily., Disp: 30 tablet, Rfl: 11   furosemide (LASIX) 20 MG tablet, Take 1 tablet (20 mg total) by mouth as needed., Disp: 30 tablet, Rfl: 10   levothyroxine (SYNTHROID) 25 MCG tablet, Take 37.5 mcg by mouth daily before breakfast., Disp: , Rfl:    nitroGLYCERIN (NITROSTAT) 0.4 MG  SL tablet, Place 1 tablet (0.4 mg total) under the tongue every 5 (five) minutes x 3 doses as needed for chest pain., Disp: 75 tablet, Rfl: 3   nystatin cream (MYCOSTATIN), Apply 1 Application topically 2 (two) times daily as needed (rash)., Disp: , Rfl:    ranolazine (RANEXA) 1000 MG SR tablet, Take 1 tablet (1,000 mg total) by mouth 2 (two) times daily., Disp: 180 tablet, Rfl: 3   sacubitril-valsartan (ENTRESTO) 24-26 MG, Take 1 tablet by mouth 2 (two) times daily., Disp: 60 tablet, Rfl: 11   spironolactone (ALDACTONE) 25 MG tablet, Take 1 tablet (25 mg total) by mouth daily., Disp: 90 tablet, Rfl: 3   umeclidinium-vilanterol (ANORO ELLIPTA) 62.5-25 MCG/ACT AEPB, Inhale 1 puff into the lungs daily., Disp: 60 each,  Rfl: 5   valACYclovir (VALTREX) 1000 MG tablet, Take 1,000 mg by mouth daily., Disp: , Rfl:

## 2022-09-14 NOTE — Patient Instructions (Addendum)
Continue anoro ellipta 1 puff daily  Continue as needed albuterol.   We will consider checking an overnight oxygen study in the future.   Follow up in 6 months

## 2022-09-19 NOTE — Progress Notes (Signed)
Remote ICD transmission.   

## 2022-09-25 ENCOUNTER — Encounter: Payer: Self-pay | Admitting: Pulmonary Disease

## 2022-10-10 ENCOUNTER — Ambulatory Visit (HOSPITAL_COMMUNITY)
Admission: RE | Admit: 2022-10-10 | Discharge: 2022-10-10 | Disposition: A | Discharge status: Home / Self Care | Payer: Federal, State, Local not specified - PPO | Source: Ambulatory Visit | Attending: Cardiology | Admitting: Cardiology

## 2022-10-10 ENCOUNTER — Encounter (HOSPITAL_COMMUNITY): Payer: Self-pay | Admitting: Cardiology

## 2022-10-10 VITALS — BP 90/50 | HR 56 | Wt 113.0 lb

## 2022-10-10 DIAGNOSIS — Z79899 Other long term (current) drug therapy: Secondary | ICD-10-CM | POA: Insufficient documentation

## 2022-10-10 DIAGNOSIS — I2510 Atherosclerotic heart disease of native coronary artery without angina pectoris: Secondary | ICD-10-CM | POA: Insufficient documentation

## 2022-10-10 DIAGNOSIS — I5022 Chronic systolic (congestive) heart failure: Secondary | ICD-10-CM | POA: Diagnosis not present

## 2022-10-10 DIAGNOSIS — Z7984 Long term (current) use of oral hypoglycemic drugs: Secondary | ICD-10-CM | POA: Diagnosis not present

## 2022-10-10 DIAGNOSIS — I255 Ischemic cardiomyopathy: Secondary | ICD-10-CM | POA: Diagnosis not present

## 2022-10-10 DIAGNOSIS — J439 Emphysema, unspecified: Secondary | ICD-10-CM | POA: Insufficient documentation

## 2022-10-10 DIAGNOSIS — R54 Age-related physical debility: Secondary | ICD-10-CM | POA: Diagnosis not present

## 2022-10-10 DIAGNOSIS — Z95810 Presence of automatic (implantable) cardiac defibrillator: Secondary | ICD-10-CM | POA: Insufficient documentation

## 2022-10-10 LAB — BASIC METABOLIC PANEL
Anion gap: 11 (ref 5–15)
BUN: 16 mg/dL (ref 8–23)
CO2: 21 mmol/L — ABNORMAL LOW (ref 22–32)
Calcium: 9.4 mg/dL (ref 8.9–10.3)
Chloride: 99 mmol/L (ref 98–111)
Creatinine, Ser: 1.15 mg/dL — ABNORMAL HIGH (ref 0.44–1.00)
GFR, Estimated: 51 mL/min — ABNORMAL LOW (ref >60)
Glucose, Bld: 103 mg/dL — ABNORMAL HIGH (ref 70–99)
Potassium: 4.5 mmol/L (ref 3.5–5.1)
Sodium: 131 mmol/L — ABNORMAL LOW (ref 135–145)

## 2022-10-10 LAB — BRAIN NATRIURETIC PEPTIDE: B Natriuretic Peptide: 514.6 pg/mL — ABNORMAL HIGH (ref 0.0–100.0)

## 2022-10-10 MED ORDER — BISOPROLOL FUMARATE 5 MG PO TABS: 2.5000 mg | ORAL_TABLET | Freq: Every day | ORAL | 3 refills | Status: DC

## 2022-10-10 NOTE — Patient Instructions (Signed)
DECREASE Bisoprolol to 2.5 mg ( 1/2 Tab ) daily.  Labs done today, your results will be available in MyChart, we will contact you for abnormal readings.  Your physician recommends that you schedule a follow-up appointment in: 2 months.  If you have any questions or concerns before your next appointment please send [place] a message through [place] or call our office at [phone].    TO LEAVE A MESSAGE FOR THE NURSE SELECT OPTION 2, PLEASE LEAVE A MESSAGE INCLUDING: YOUR NAME DATE OF BIRTH CALL BACK NUMBER REASON FOR CALL**this is important as we prioritize the call backs  YOU WILL RECEIVE A CALL BACK THE SAME DAY AS LONG AS YOU CALL BEFORE 4:00 PM  At the [facility], you and your health needs are our priority. As part of our continuing mission to provide you with exceptional heart care, we have created designated Provider Care Teams. These Care Teams include your primary Cardiologist (physician) and Advanced Practice Providers (APPs- Physician Assistants and Nurse Practitioners) who all work together to provide you with the care you need, when you need it.   You may see any of the following providers on your designated Care Team at your next follow up: Dr Glori Bickers Dr Loralie Champagne Dr. Roxana Hires, NP Lyda Jester, [place] [facility] [place], [place] Forestine Na, NP Audry Riles, PharmD   Please be sure to bring in all your medications bottles to every appointment.    Thank you for choosing [facility]

## 2022-10-10 NOTE — Progress Notes (Signed)
ADVANCED HEART FAILURE CLINIC NOTE  Referring Physician: Loraine [name].,*  Primary Care: Pam [name]., MD Primary Cardiologist: Dr. Angelena [name], Dr. Johnsie [name]  HPI: Pam [name] is a 72 y.o. female with chronic systolic heart failure secondary to ischemic cardiomyopathy status post ICD, coronary artery disease with CTO of the mid LAD filled with left to left and right to left collaterals presenting today to establish care.  Her cardiac history dates back to December 2017 when she presented with an NSTEMI.  At that time echocardiogram demonstrated LVEF of 35 to 40% with preserved RV function.  Coronary angiography with heavily calcified CTO of the proximal to mid LAD filled by collaterals and calcified 80% stenosis of the circumflex/OM1.  Follow-up cardiac MRI demonstrated nonviable LAD territory.  She ultimately underwent atherectomy and IVUS guided PCI of the proximal circumflex/OM1.  In November 2020 she had a reduction in her LVEF to 25 to 30% and underwent Med[employer] ICD placement in early 2021.  According to [name] she did fairly well for a few months however over the past several months has once again had some progression of dyspnea and a decrease in exercise capacity.  She was admitted in September 2023 with acute on chronic CHF exacerbation.  TTE and left heart cath from admission were stable compared to prior.  She was continued on medical therapy for CAD and started on low-dose GDMT for systolic heart failure.  Since that time she has been seen in [facility] clinic where GDMT was further uptitrated.  She also underwent PFTs that were significant for reversible obstructive lung disease and emphysema.  Interval history: We previously met and discussed possibility of LVAD. At this time Pam [name] and her husband have decided against LVAD therapy. From a functional standpoint, she still goes grocery shopping, performs all ADLs independently; however, has to move very slowly. Her Bps remain in  the low 90s with very little lightheadedness. She is just finishing a course of antibiotics for an upper respiratory infection. Her husband reports that she is often 'staggering'.   Activity level/exercise tolerance: NYHA IIIB Orthopnea:  Sleeps on 2 pillows Paroxysmal noctural dyspnea: Yes but infrequent Chest pain/pressure: Stable at this time Orthostatic lightheadedness: No Palpitations: No Lower extremity edema: No, resolved Presyncope/syncope: No Cough: No  Past Medical History:  Diagnosis Date   Abnormal EKG    AICD (automatic cardioverter/defibrillator) present    Alcoholic intoxication without complication ([place])    Allergy    Cardiomyopathy, ischemic    Cataract    Chest pain 08/29/2016   Chest pain 08/29/2016   CHF (congestive heart failure) ([place])    Chronic systolic heart failure ([place]) 08/29/2016   Facial laceration    Fall 07/07/2016   Hyperlipidemia    Hypertension 03/03/2017   Hyponatremia    Hypothyroidism 07/08/2016   NSTEMI (non-ST elevated myocardial infarction) ([place])    Syncope    Thyroid disease     Current Outpatient Medications  Medication Sig Dispense Refill   acetaminophen (TYLENOL) 500 MG tablet Take 1,000 mg by mouth every 6 (six) hours as needed for moderate pain.     albuterol (VENTOLIN HFA) 108 (90 Base) MCG/ACT inhaler Inhale 2 puffs into the lungs every 6 (six) hours as needed for wheezing or shortness of breath. 8 g 6   alendronate (FOSAMAX) 70 MG tablet TAKE 1 TABLET(70 MG) BY MOUTH 1 TIME A WEEK WITH A FULL GLASS OF WATER AND ON AN EMPTY STOMACH 12 tablet 1   atorvastatin (LIPITOR) 80  MG tablet TAKE 1 TABLET(80 MG) BY MOUTH DAILY 90 tablet 1   bisoprolol (ZEBETA) 5 MG tablet Take 1 tablet (5 mg total) by mouth daily. 30 tablet 11   Cholecalciferol (VITAMIN D3) 50 MCG (2000 UT) TABS Take 2,000 mcg by mouth daily.     clopidogrel (PLAVIX) 75 MG tablet TAKE 1 TABLET(75 MG) BY MOUTH DAILY WITH BREAKFAST 90 tablet 3   empagliflozin (JARDIANCE)  10 MG TABS tablet Take 1 tablet (10 mg total) by mouth daily. 30 tablet 11   furosemide (LASIX) 20 MG tablet Take 1 tablet (20 mg total) by mouth as needed. 30 tablet 10   levothyroxine (SYNTHROID) 25 MCG tablet Take 37.5 mcg by mouth daily before breakfast.     nitroGLYCERIN (NITROSTAT) 0.4 MG SL tablet Place 1 tablet (0.4 mg total) under the tongue every 5 (five) minutes x 3 doses as needed for chest pain. 75 tablet 3   nystatin cream (MYCOSTATIN) Apply 1 Application topically 2 (two) times daily as needed (rash).     ranolazine (RANEXA) 1000 MG SR tablet Take 1 tablet (1,000 mg total) by mouth 2 (two) times daily. 180 tablet 3   sacubitril-valsartan (ENTRESTO) 24-26 MG Take 1 tablet by mouth 2 (two) times daily. 60 tablet 11   spironolactone (ALDACTONE) 25 MG tablet Take 1 tablet (25 mg total) by mouth daily. 90 tablet 3   umeclidinium-vilanterol (ANORO ELLIPTA) 62.5-25 MCG/ACT AEPB Inhale 1 puff into the lungs daily. 60 each 5   valACYclovir (VALTREX) 1000 MG tablet Take 1,000 mg by mouth daily.     No current facility-administered medications for this encounter.    Allergies  Allergen Reactions   Lisinopril Cough    Dry cough      Social History   Socioeconomic History   Marital status: Married    Spouse name: Pam [name]   Number of children: 2   Years of education: Not on file   Highest education level: High school graduate  Occupational History   Occupation: Retired-worked for post office  Tobacco Use   Smoking status: Former    Packs/day: 1.00    Years: 50.00    Total pack years: 50.00    Types: Cigarettes    Quit date: 07/07/2016    Years since quitting: 6.2   Smokeless tobacco: Never  Vaping Use   Vaping Use: Never used  Substance and Sexual Activity   Alcohol use: Not Currently   Drug use: Never   Sexual activity: Not Currently  Other Topics Concern   Not on file  Social History Narrative   Not on file   Social Determinants of Health   Financial Resource  Strain: Low Risk  (04/29/2022)   Overall Financial Resource Strain (CARDIA)    Difficulty of Paying Living Expenses: Not hard at all  Food Insecurity: No Food Insecurity (04/29/2022)   Hunger Vital Sign    Worried About Running Out of Food in the Last Year: Never true    [place] in the Last Year: Never true  Transportation Needs: No Transportation Needs (04/29/2022)   PRAPARE - Hydrologist (Medical): No    Lack of Transportation (Non-Medical): No  Physical Activity: Not on file  Stress: Not on file  Social Connections: Not on file  Intimate Partner Violence: Not on file      Family History  Problem Relation Age of Onset   Kidney failure Mother    Heart disease Mother  CHF   Heart disease Sister    Colon cancer Neg Hx    Esophageal cancer Neg Hx    Rectal cancer Neg Hx    Stomach cancer Neg Hx     PHYSICAL EXAM: Vitals:   10/10/22 0854  BP: (!) 90/50  Pulse: (!) 56  SpO2: 96%    GENERAL: NAD HEENT: Negative for arcus senilis or xanthelasma. There is no scleral icterus.  The mucous membranes are pink and moist.   NECK: Supple, No masses. Normal carotid upstrokes without bruits. No masses or thyromegaly.    CHEST: There are no chest wall deformities. There is no chest wall tenderness. Respirations are unlabored.  Lungs-CTA B/L; no crackles or wheezing.  CARDIAC:  JVP: <8         Normal S1, S2  RRR. No murmurs, rubs or gallops.  Pulses are 2+ and symmetrical in upper and lower extremities.  No edema.  ABDOMEN:soft, nontender.  EXTREMITIES: cool distal extremities.  LYMPHATIC: No axillary or supraclavicular lymphadenopathy.  NEUROLOGIC: Patient is oriented x3 with no focal or lateralizing neurologic deficits.  PSYCH: Patients affect is appropriate, there is no evidence of anxiety or depression.  SKIN: no lesions; dry.   DATA REVIEW  ECG: Normal sinus rhythm  ECHO: 04/27/22: LVEF 25%-30%, RV function normal.  06/19/19: LVEF  25%-30%, Normal RV function.   CMR:  12/17: . Normal size and thickness of the left ventricle. Systolic function is moderately decreased (LVEF = 38%).  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 There is akinesis of the apical lateral, mid anterior, anteroseptal walls.  The LAD territory is non-viable, RCA and LCX territory is viable.  2. Normal right ventricular size, thickness and systolic function (RVEF = 54%) with no regional wall motion abnormalities.  3.  Normal biatrial size.  4.  Mild mitral and mild to moderate tricuspid regurgitation.  5. Normal size of the aortic root, ascending aorta and pulmonary artery.  6.  Normal pericardium, trivial pericardial effusion.   CATH: 04/29/22:  Prox LAD to Mid LAD lesion is 90% stenosed with 95% stenosed side branch in 1st Sept. Mid LAD to Dist LAD lesion is 100% stenosed.   Previously placed Prox Cx to Dist Cx stent of unknown type is  widely patent with 0% stenosed side branch in 1st Mrg.   Lat 1st Mrg lesion is 50% stenosed.   LV end diastolic pressure is moderately elevated.   There is no aortic valve stenosis.  08/11/22, RHC: HEMODYNAMICS: RA:                  1 mmHg (mean) RV:                  27/1 mmHg PA:                  31/5 mmHg (14 mean) PCWP:            5 mmHg (mean)                                      Estimated Fick CO/CI   2.87 L/min, 1.89 L/min/m2  TPG                 9  mmHg                                              PVR                 3.14 Wood Units  PAPi                >4  ASSESSMENT & PLAN:  NYHA IIB-III, Stage C, Systolic heart failrue Etiology of HF: Secondary to ischemic cardiomyopathy with nonviable myocardium as reported above NYHA class / AHA Stage: NYHA III Volume status & Diuretics: Low filling pressures on RHC=; will take lasix PRN only now.  Vasodilators: Continue Entresto 24/'26mg'$  BID; will likely need to  transition back to losartan due to decreasing Bps. Will monitor closely.  Beta-Blocker: decrease bisoprolol to 2.'5mg'$  BID. May need to discontinue in the future and replace with digoxin. At this time she does not wish to make many medication changes.  MRA: Continue spironolactone Cardiometabolic: Jardiance 10 mg daily Devices therapies & Valvulopathies: Primary prevention ICD in place Advanced therapies: CPX w/ VE/VCO2 slop of 69.4 and PVO2 of 13.2; CPX consistent with moderate to severe cardiac limitation in addition to obstructive lung disease. I had a lengthy discussion with the patient today regarding advanced therapies. Her COPD, age and frailty make her a marginal LVAD candidate, however, she has no absolute contraindications to durable VAD support. RHC last week with cardiac index of 1.89L/min/m2. Today we had a very length discussion with our LVAD coordinator Tanda Rockers. We discussed all her prior testing including CPX, RHC, echocardiograms and prognosis of patients with stage D heart failure. We also discussed our LVAD outcomes and national outcomes. After multiple lengthy discussions with Ms. Coons and her husband, they have decided against LVAD at this time. She is slowly showing signs of progressive LV failure including hypotension, bradycardia; we will likely have to start decreasing GDMT doses moving forward.   2.  COPD -Now followed by pulmonology; started on inhalers.   3.  Coronary artery disease -Management as per primary cardiology  Violet [facility]ab[facility] Advanced [facility] Mechanical Circulatory Support

## 2022-11-22 ENCOUNTER — Ambulatory Visit (INDEPENDENT_AMBULATORY_CARE_PROVIDER_SITE_OTHER): Payer: Federal, State, Local not specified - PPO

## 2022-11-22 DIAGNOSIS — I255 Ischemic cardiomyopathy: Secondary | ICD-10-CM | POA: Diagnosis not present

## 2022-11-23 LAB — CUP PACEART REMOTE DEVICE CHECK
Battery Remaining Longevity: 112 mo
Battery Voltage: 3.01 V
Brady Statistic RV Percent Paced: 0.01 %
Date Time Interrogation Session: 20240423033423
HighPow Impedance: 56 Ohm
Implantable Lead Connection Status: 753985
Implantable Lead Implant Date: 20210127
Implantable Lead Location: 753860
Implantable Pulse Generator Implant Date: 20210127
Lead Channel Impedance Value: 304 Ohm
Lead Channel Impedance Value: 342 Ohm
Lead Channel Pacing Threshold Amplitude: 0.625 V
Lead Channel Pacing Threshold Pulse Width: 0.4 ms
Lead Channel Sensing Intrinsic Amplitude: 8.5 mV
Lead Channel Sensing Intrinsic Amplitude: 8.5 mV
Lead Channel Setting Pacing Amplitude: 2 V
Lead Channel Setting Pacing Pulse Width: 0.4 ms
Lead Channel Setting Sensing Sensitivity: 0.3 mV
Zone Setting Status: 755011
Zone Setting Status: 755011

## 2022-12-05 ENCOUNTER — Other Ambulatory Visit: Payer: Self-pay | Admitting: Cardiovascular Disease

## 2022-12-08 ENCOUNTER — Other Ambulatory Visit: Payer: Self-pay | Admitting: Pulmonary Disease

## 2022-12-08 DIAGNOSIS — N1831 Chronic kidney disease, stage 3a: Secondary | ICD-10-CM | POA: Insufficient documentation

## 2022-12-08 DIAGNOSIS — J432 Centrilobular emphysema: Secondary | ICD-10-CM

## 2022-12-12 ENCOUNTER — Encounter (HOSPITAL_COMMUNITY): Payer: Federal, State, Local not specified - PPO | Admitting: Cardiology

## 2022-12-13 ENCOUNTER — Ambulatory Visit (HOSPITAL_COMMUNITY)
Admission: RE | Admit: 2022-12-13 | Discharge: 2022-12-13 | Disposition: A | Discharge status: Home / Self Care | Payer: Federal, State, Local not specified - PPO | Source: Ambulatory Visit | Attending: Cardiology | Admitting: Cardiology

## 2022-12-13 ENCOUNTER — Encounter (HOSPITAL_COMMUNITY): Payer: Self-pay | Admitting: Cardiology

## 2022-12-13 VITALS — BP 100/60 | HR 57 | Wt 113.2 lb

## 2022-12-13 DIAGNOSIS — R54 Age-related physical debility: Secondary | ICD-10-CM | POA: Insufficient documentation

## 2022-12-13 DIAGNOSIS — J439 Emphysema, unspecified: Secondary | ICD-10-CM | POA: Diagnosis not present

## 2022-12-13 DIAGNOSIS — Z95810 Presence of automatic (implantable) cardiac defibrillator: Secondary | ICD-10-CM | POA: Insufficient documentation

## 2022-12-13 DIAGNOSIS — Z7984 Long term (current) use of oral hypoglycemic drugs: Secondary | ICD-10-CM | POA: Diagnosis not present

## 2022-12-13 DIAGNOSIS — I5022 Chronic systolic (congestive) heart failure: Secondary | ICD-10-CM | POA: Insufficient documentation

## 2022-12-13 DIAGNOSIS — E88A Wasting disease (syndrome) due to underlying condition: Secondary | ICD-10-CM | POA: Diagnosis not present

## 2022-12-13 DIAGNOSIS — Z79899 Other long term (current) drug therapy: Secondary | ICD-10-CM | POA: Diagnosis not present

## 2022-12-13 DIAGNOSIS — I255 Ischemic cardiomyopathy: Secondary | ICD-10-CM | POA: Diagnosis not present

## 2022-12-13 DIAGNOSIS — I2510 Atherosclerotic heart disease of native coronary artery without angina pectoris: Secondary | ICD-10-CM | POA: Diagnosis not present

## 2022-12-13 DIAGNOSIS — J438 Other emphysema: Secondary | ICD-10-CM | POA: Diagnosis not present

## 2022-12-13 NOTE — Patient Instructions (Addendum)
Good to see you today!   No medication changes  CT of chest ordered they will call you call to schedule   Your physician recommends that you schedule a follow-up appointment in: 3 months as scheduled   If you have any questions or concerns before your next appointment please send [place] a message through [place] or call our office at [phone].    TO LEAVE A MESSAGE FOR THE NURSE SELECT OPTION 2, PLEASE LEAVE A MESSAGE INCLUDING: YOUR NAME DATE OF BIRTH CALL BACK NUMBER REASON FOR CALL**this is important as we prioritize the call backs  YOU WILL RECEIVE A CALL BACK THE SAME DAY AS LONG AS YOU CALL BEFORE 4:00 PM  At the [facility], you and your health needs are our priority. As part of our continuing mission to provide you with exceptional heart care, we have created designated Provider Care Teams. These Care Teams include your primary Cardiologist (physician) and Advanced Practice Providers (APPs- Physician Assistants and Nurse Practitioners) who all work together to provide you with the care you need, when you need it.   You may see any of the following providers on your designated Care Team at your next follow up: Dr Arvilla Meres Dr Marca Ancona Dr. Marcos Eke, NP Robbie Lis, [place] [facility] [place], [place] Brynda Peon, NP Karle Plumber, PharmD   Please be sure to bring in all your medications bottles to every appointment.    Thank you for choosing [facility]

## 2022-12-13 NOTE — Progress Notes (Signed)
ADVANCED HEART FAILURE CLINIC NOTE  Referring Physician: Cheron [name].,*  Primary Care: Pam [name]., MD Primary Cardiologist: Dr. Clifton James, Dr. Eden Emms  HPI: Pam [name] is a 72 y.o. female with chronic systolic heart failure secondary to ischemic cardiomyopathy status post ICD, coronary artery disease with CTO of the mid LAD filled with left to left and right to left collaterals presenting today to establish care.  Her cardiac history dates back to December 2017 when she presented with an NSTEMI.  At that time echocardiogram demonstrated LVEF of 35 to 40% with preserved RV function.  Coronary angiography with heavily calcified CTO of the proximal to mid LAD filled by collaterals and calcified 80% stenosis of the circumflex/OM1.  Follow-up cardiac MRI demonstrated nonviable LAD territory.  She ultimately underwent atherectomy and IVUS guided PCI of the proximal circumflex/OM1.  In November 2020 she had a reduction in her LVEF to 25 to 30% and underwent Med[employer] ICD placement in early 2021.  According to [name] she did fairly well for a few months however over the past several months has once again had some progression of dyspnea and a decrease in exercise capacity.  She was admitted in September 2023 with acute on chronic CHF exacerbation.  TTE and left heart cath from admission were stable compared to prior.  She was continued on medical therapy for CAD and started on low-dose GDMT for systolic heart failure.  Since that time she has been seen in [facility] clinic where GDMT was further uptitrated.  She also underwent PFTs that were significant for reversible obstructive lung disease and emphysema.  We previously met and discussed possibility of LVAD. At this time Pam [name] and her husband have decided against LVAD therapy. From a functional standpoint, she still goes grocery shopping, performs all ADLs independently; however, has to move very slowly. Her Bps remain in the low 90s with  very little lightheadedness. She is just finishing a course of antibiotics for an upper respiratory infection. Her husband reports that she is often 'staggering'.   Interval hx:  No significant changes since her last visit. Currently not interested in advanced therapies. Still performs ADLs independently, however, becoming increasingly weaker and fatigued.   Activity level/exercise tolerance: NYHA IIIB Orthopnea:  Sleeps on 2 pillows Paroxysmal noctural dyspnea: Yes but infrequent Chest pain/pressure: Stable at this time Orthostatic lightheadedness: No Palpitations: No Lower extremity edema: No Presyncope/syncope: No Cough: No  Past Medical History:  Diagnosis Date   Abnormal EKG    AICD (automatic cardioverter/defibrillator) present    Alcoholic intoxication without complication (HCC)    Allergy    Cardiomyopathy, ischemic    Cataract    Chest pain 08/29/2016   Chest pain 08/29/2016   CHF (congestive heart failure) (HCC)    Chronic systolic heart failure (HCC) 08/29/2016   Facial laceration    Fall 07/07/2016   Hyperlipidemia    Hypertension 03/03/2017   Hyponatremia    Hypothyroidism 07/08/2016   NSTEMI (non-ST elevated myocardial infarction) (HCC)    Syncope    Thyroid disease     Current Outpatient Medications  Medication Sig Dispense Refill   acetaminophen (TYLENOL) 500 MG tablet Take 1,000 mg by mouth every 6 (six) hours as needed for moderate pain.     albuterol (VENTOLIN HFA) 108 (90 Base) MCG/ACT inhaler Inhale 2 puffs into the lungs every 6 (six) hours as needed for wheezing or shortness of breath. 8 g 6   alendronate (FOSAMAX) 70 MG tablet TAKE 1 TABLET(70 MG)  BY MOUTH 1 TIME A WEEK WITH A FULL GLASS OF WATER AND ON AN EMPTY STOMACH 12 tablet 1   ANORO ELLIPTA 62.5-25 MCG/ACT AEPB INHALE 1 PUFF INTO THE LUNGS DAILY 60 each 5   atorvastatin (LIPITOR) 80 MG tablet TAKE 1 TABLET(80 MG) BY MOUTH DAILY 90 tablet 1   bisoprolol (ZEBETA) 5 MG tablet Take 0.5 tablets  (2.5 mg total) by mouth daily. 45 tablet 3   Cholecalciferol (VITAMIN D3) 50 MCG (2000 UT) TABS Take 2,000 mcg by mouth daily.     clopidogrel (PLAVIX) 75 MG tablet TAKE 1 TABLET(75 MG) BY MOUTH DAILY WITH BREAKFAST. Please keep scheduled appointment with Dr. Clifton James for future refills. Thank you. 90 tablet 0   empagliflozin (JARDIANCE) 10 MG TABS tablet Take 1 tablet (10 mg total) by mouth daily. 30 tablet 11   furosemide (LASIX) 20 MG tablet Take 1 tablet (20 mg total) by mouth as needed. 30 tablet 10   levothyroxine (SYNTHROID) 25 MCG tablet Take 37.5 mcg by mouth daily before breakfast.     nitroGLYCERIN (NITROSTAT) 0.4 MG SL tablet Place 1 tablet (0.4 mg total) under the tongue every 5 (five) minutes x 3 doses as needed for chest pain. 75 tablet 3   nystatin cream (MYCOSTATIN) Apply 1 Application topically 2 (two) times daily as needed (rash).     ranolazine (RANEXA) 1000 MG SR tablet Take 1 tablet (1,000 mg total) by mouth 2 (two) times daily. 180 tablet 3   sacubitril-valsartan (ENTRESTO) 24-26 MG Take 1 tablet by mouth 2 (two) times daily. 60 tablet 11   spironolactone (ALDACTONE) 25 MG tablet Take 1 tablet (25 mg total) by mouth daily. 90 tablet 3   valACYclovir (VALTREX) 1000 MG tablet Take 1,000 mg by mouth daily.     No current facility-administered medications for this encounter.    Allergies  Allergen Reactions   Lisinopril Cough    Dry cough      Social History   Socioeconomic History   Marital status: Married    Spouse name: TEFL teacher   Number of children: 2   Years of education: Not on file   Highest education level: High school graduate  Occupational History   Occupation: Retired-worked for post office  Tobacco Use   Smoking status: Former    Packs/day: 1.00    Years: 50.00    Additional pack years: 0.00    Total pack years: 50.00    Types: Cigarettes    Quit date: 07/07/2016    Years since quitting: 6.4   Smokeless tobacco: Never  Vaping Use   Vaping Use:  Never used  Substance and Sexual Activity   Alcohol use: Not Currently   Drug use: Never   Sexual activity: Not Currently  Other Topics Concern   Not on file  Social History Narrative   Not on file   Social Determinants of Health   Financial Resource Strain: Low Risk  (04/29/2022)   Overall Financial Resource Strain (CARDIA)    Difficulty of Paying Living Expenses: Not hard at all  Food Insecurity: No Food Insecurity (04/29/2022)   Hunger Vital Sign    Worried About Running Out of Food in the Last Year: Never true    Ran Out of Food in the Last Year: Never true  Transportation Needs: No Transportation Needs (04/29/2022)   PRAPARE - Administrator, Civil Service (Medical): No    Lack of Transportation (Non-Medical): No  Physical Activity: Not on file  Stress: Not  on file  Social Connections: Not on file  Intimate Partner Violence: Not on file      Family History  Problem Relation Age of Onset   Kidney failure Mother    Heart disease Mother        CHF   Heart disease Sister    Colon cancer Neg Hx    Esophageal cancer Neg Hx    Rectal cancer Neg Hx    Stomach cancer Neg Hx     PHYSICAL EXAM: Vitals:   12/13/22 1002  BP: 100/60  Pulse: (!) 57  SpO2: 96%   GENERAL: thin cachectic WF HEENT: Negative for arcus senilis or xanthelasma. There is no scleral icterus.  The mucous membranes are pink and moist.   NECK: Supple, No masses. Normal carotid upstrokes without bruits. No masses or thyromegaly.    CHEST: There are no chest wall deformities. There is no chest wall tenderness. Respirations are unlabored.  Lungs- CTA B/L CARDIAC:  JVP: 7 cm          Normal rate with regular rhythm. No murmurs, rubs or gallops.  Pulses are 2+ and symmetrical in upper and lower extremities. No edema.  ABDOMEN: Soft, non-tender, non-distended. There are no masses or hepatomegaly. There are normal bowel sounds.  EXTREMITIES: cool to touch. LYMPHATIC: No axillary or supraclavicular  lymphadenopathy.  NEUROLOGIC: Patient is oriented x3 with no focal or lateralizing neurologic deficits.  PSYCH: Patients affect is appropriate, there is no evidence of anxiety or depression.  SKIN: Warm and dry; no lesions or wounds.    DATA REVIEW  ECG: Normal sinus rhythm  ECHO: 04/27/22: LVEF 25%-30%, RV function normal.  06/19/19: LVEF 25%-30%, Normal RV function.   CMR:  12/17: . Normal size and thickness of the left ventricle. Systolic function is moderately decreased (LVEF = 38%).  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 There is akinesis of the apical lateral, mid anterior, anteroseptal walls.  The LAD territory is non-viable, RCA and LCX territory is viable.  2. Normal right ventricular size, thickness and systolic function (RVEF = 54%) with no regional wall motion abnormalities.  3.  Normal biatrial size.  4.  Mild mitral and mild to moderate tricuspid regurgitation.  5. Normal size of the aortic root, ascending aorta and pulmonary artery.  6.  Normal pericardium, trivial pericardial effusion.   CATH: 04/29/22:  Prox LAD to Mid LAD lesion is 90% stenosed with 95% stenosed side branch in 1st Sept. Mid LAD to Dist LAD lesion is 100% stenosed.   Previously placed Prox Cx to Dist Cx stent of unknown type is  widely patent with 0% stenosed side branch in 1st Mrg.   Lat 1st Mrg lesion is 50% stenosed.   LV end diastolic pressure is moderately elevated.   There is no aortic valve stenosis.  08/11/22, RHC: HEMODYNAMICS: RA:                  1 mmHg (mean) RV:                  27/1 mmHg PA:                  31/5 mmHg (14 mean) PCWP:            5 mmHg (mean)  Estimated Fick CO/CI   2.87 L/min, 1.89 L/min/m2                                          TPG                 9  mmHg                                              PVR                 3.14 Wood Units  PAPi                 >4  ASSESSMENT & PLAN:  NYHA IIB-III, Stage C, Systolic heart failrue Etiology of HF: Secondary to ischemic cardiomyopathy with nonviable myocardium as reported above NYHA class / AHA Stage: NYHA III Volume status & Diuretics: Low filling pressures on RHC=; will take lasix PRN only now.  Vasodilators: Continue Entresto 24/26mg  BID; will likely need to transition back to losartan due to decreasing Bps. Will monitor closely.  Beta-Blocker: decrease bisoprolol to 2.5mg  BID. May need to discontinue in the future and replace with digoxin. At this time she does not wish to make many medication changes.  MRA: Continue spironolactone Cardiometabolic: Jardiance 10 mg daily Devices therapies & Valvulopathies: Primary prevention ICD in place Advanced therapies: CPX w/ VE/VCO2 slop of 69.4 and PVO2 of 13.2; CPX consistent with moderate to severe cardiac limitation in addition to obstructive lung disease. I had a lengthy discussion with the patient today regarding advanced therapies. Her COPD, age and frailty make her a marginal LVAD candidate, however, she has no absolute contraindications to durable VAD support. RHC last week with cardiac index of 1.89L/min/m2. Today we had a very length discussion with our LVAD coordinator Carlton Adam. We discussed all her prior testing including CPX, RHC, echocardiograms and prognosis of patients with stage D heart failure. We also discussed our LVAD outcomes and national outcomes. After multiple lengthy discussions with Ms. Ragains and her husband, they have decided against LVAD at this time. She is slowly showing signs of progressive LV failure including hypotension, bradycardia; we will likely have to start decreasing GDMT doses moving forward.  12/13/22: I remain concerned about her prognosis over the next year. She continues to lose weight secondary to cardiac cachexia. She is otherwise very independent with good social support, however, not interested in advanced therapies  including LVAD, BATwire, etc. Will repeat labs today. No med changes.   2.  COPD -Now followed by pulmonology; started on inhalers.   3.  Coronary artery disease -Management as per primary cardiology  Asher Torpey Advanced Heart Failure Mechanical Circulatory Support

## 2022-12-19 ENCOUNTER — Ambulatory Visit: Payer: Federal, State, Local not specified - PPO | Attending: Cardiovascular Disease | Admitting: Cardiovascular Disease

## 2022-12-19 ENCOUNTER — Encounter: Payer: Self-pay | Admitting: Cardiovascular Disease

## 2022-12-19 VITALS — BP 108/56 | HR 64 | Ht 63.0 in | Wt 111.0 lb

## 2022-12-19 DIAGNOSIS — E785 Hyperlipidemia, unspecified: Secondary | ICD-10-CM

## 2022-12-19 DIAGNOSIS — I25119 Atherosclerotic heart disease of native coronary artery with unspecified angina pectoris: Secondary | ICD-10-CM | POA: Diagnosis not present

## 2022-12-19 DIAGNOSIS — I5022 Chronic systolic (congestive) heart failure: Secondary | ICD-10-CM | POA: Diagnosis not present

## 2022-12-19 DIAGNOSIS — I255 Ischemic cardiomyopathy: Secondary | ICD-10-CM

## 2022-12-19 NOTE — Patient Instructions (Signed)
Medication Instructions:  No changes *If you need a refill on your cardiac medications before your next appointment, please call your pharmacy*   Lab Work: none If you have labs (blood work) drawn today and your tests are completely normal, you will receive your results only by: MyChart Message (if you have MyChart) OR A paper copy in the mail If you have any lab test that is abnormal or we need to change your treatment, we will call you to review the results.   Testing/Procedures: none   Follow-Up: At [facility], you and your health needs are our priority.  As part of our continuing mission to provide you with exceptional heart care, we have created designated Provider Care Teams.  These Care Teams include your primary Cardiologist (physician) and Advanced Practice Providers (APPs -  Physician Assistants and Nurse Practitioners) who all work together to provide you with the care you need, when you need it.   Your next appointment:   12 month(s)  Provider:   Christopher McAlhany, MD      

## 2022-12-19 NOTE — Progress Notes (Signed)
Chief Complaint  Patient presents with   Follow-up    CAD   History of Present Illness: 71 yo female with history of CAD, ischemic cardiomyopathy, chronic systolic CHF, HTN, HLD and hypothyroidism who is here today for cardiac follow up. She had been followed by Dr. Okey Dupre. I met her in November 2019. She was admitted to [facility] in December 2017 with a NSTEMI. She was found to have a chronic occlusion of the mid LAD and severe, calcified stenosis in the proximal Circumflex.  LVEF was 30% by LV gram and 35-40% by echo. Cardiac MRI December 2017 with non-viability of the anterior wall in the LAD territory. LVEF=38% by cardiac MRI. The Circumflex stenosis was treated with orbital atherectomy and stenting using a drug eluting stent in a staged procedure in January 2018. Echo November 2020 with LVEF=25-30%. Dilated LV. Mild MR. ICD placed in January 2021 and followed by Dr. Raul Del. She was admitted to [facility] in September 2023 with acute on chronic CHF exacerbation. Cardiac cath September 2023 with stable CAD. Echo September 2023 with LVEF=25-30%. No significant valve disease. She is now followed in our Advanced Heart failure clinic by Dr. Gasper Lloyd. Right heart cath January 2024 with low right heart pressures but low cardiac output. She was most recently seen by Dr. Gasper Lloyd on 12/13/22 and did not wish to consider any advanced heart failure therapies.   She is here today for follow up. The patient denies any chest pain, dyspnea, palpitations, orthopnea, PND, dizziness, near syncope or syncope. She has had some ankle edema and has been taking Lasix several days per week.    Primary Care Physician: Cheron Schaumann., MD  Past Medical History:  Diagnosis Date   Abnormal EKG    AICD (automatic cardioverter/defibrillator) present    Alcoholic intoxication without complication [facility])    Allergy    Cardiomyopathy, ischemic    Cataract    Chest pain 08/29/2016   Chest pain 08/29/2016   CHF (congestive  heart failure) (HCC)    Chronic systolic heart failure (HCC) 08/29/2016   Facial laceration    Fall 07/07/2016   Hyperlipidemia    Hypertension 03/03/2017   Hyponatremia    Hypothyroidism 07/08/2016   NSTEMI (non-ST elevated myocardial infarction) [facility])    Syncope    Thyroid disease     Past Surgical History:  Procedure Laterality Date   CARDIAC CATHETERIZATION N/A 07/08/2016   Procedure: Left Heart Cath and Coronary Angiography;  Surgeon: Yvonne Kendall, MD;  Location: [facility] INVASIVE CV LAB;  Service: Cardiovascular;  Laterality: N/A;   CARDIAC CATHETERIZATION N/A 08/29/2016   Procedure: Coronary Atherectomy;  Surgeon: Yvonne Kendall, MD;  Location: [facility] INVASIVE CV LAB;  Service: Cardiovascular;  Laterality: N/A;   CARDIAC CATHETERIZATION N/A 08/29/2016   Procedure: Coronary Stent Intervention;  Surgeon: Yvonne Kendall, MD;  Location: [facility] INVASIVE CV LAB;  Service: Cardiovascular;  Laterality: N/A;   CATARACT EXTRACTION, BILATERAL     COLONOSCOPY WITH PROPOFOL N/A 11/18/2021   Procedure: COLONOSCOPY WITH PROPOFOL;  Surgeon: Tressia Danas, MD;  Location: [facility] ENDOSCOPY;  Service: Gastroenterology;  Laterality: N/A;   CORONARY ANGIOPLASTY     coronary stents      ICD IMPLANT N/A 08/28/2019   Procedure: ICD IMPLANT;  Surgeon: Regan Lemming, MD;  Location: [facility] INVASIVE CV LAB;  Service: Cardiovascular;  Laterality: N/A;   LEFT HEART CATH AND CORONARY ANGIOGRAPHY N/A 04/29/2022   Procedure: LEFT HEART CATH AND CORONARY ANGIOGRAPHY;  Surgeon: Marykay Lex, MD;  Location: [facility] INVASIVE  CV LAB;  Service: Cardiovascular;  Laterality: N/A;   PARATHYROIDECTOMY Right 02/02/2021   Procedure: RIGHT SUPERIOR PARATHYROIDECTOMY;  Surgeon: Darnell Level, MD;  Location: [facility] ORS;  Service: General;  Laterality: Right;   POLYPECTOMY  11/18/2021   Procedure: POLYPECTOMY;  Surgeon: Tressia Danas, MD;  Location: [facility] ENDOSCOPY;  Service: Gastroenterology;;   RIGHT HEART CATH N/A 08/11/2022   Procedure:  RIGHT HEART CATH;  Surgeon: Dorthula Nettles, DO;  Location: [facility] INVASIVE CV LAB;  Service: Cardiovascular;  Laterality: N/A;   VAGINAL HYSTERECTOMY      Current Outpatient Medications  Medication Sig Dispense Refill   acetaminophen (TYLENOL) 500 MG tablet Take 1,000 mg by mouth every 6 (six) hours as needed for moderate pain.     albuterol (VENTOLIN HFA) 108 (90 Base) MCG/ACT inhaler Inhale 2 puffs into the lungs every 6 (six) hours as needed for wheezing or shortness of breath. 8 g 6   alendronate (FOSAMAX) 70 MG tablet TAKE 1 TABLET(70 MG) BY MOUTH 1 TIME A WEEK WITH A FULL GLASS OF WATER AND ON AN EMPTY STOMACH 12 tablet 1   ANORO ELLIPTA 62.5-25 MCG/ACT AEPB INHALE 1 PUFF INTO THE LUNGS DAILY 60 each 5   atorvastatin (LIPITOR) 80 MG tablet TAKE 1 TABLET(80 MG) BY MOUTH DAILY 90 tablet 1   bisoprolol (ZEBETA) 5 MG tablet Take 0.5 tablets (2.5 mg total) by mouth daily. 45 tablet 3   Cholecalciferol (VITAMIN D3) 50 MCG (2000 UT) TABS Take 2,000 mcg by mouth daily.     clopidogrel (PLAVIX) 75 MG tablet TAKE 1 TABLET(75 MG) BY MOUTH DAILY WITH BREAKFAST. Please keep scheduled appointment with Dr. Clifton James for future refills. Thank you. 90 tablet 0   empagliflozin (JARDIANCE) 10 MG TABS tablet Take 1 tablet (10 mg total) by mouth daily. 30 tablet 11   furosemide (LASIX) 20 MG tablet Take 1 tablet (20 mg total) by mouth as needed. 30 tablet 10   levothyroxine (SYNTHROID) 25 MCG tablet Take 37.5 mcg by mouth daily before breakfast.     nitroGLYCERIN (NITROSTAT) 0.4 MG SL tablet Place 1 tablet (0.4 mg total) under the tongue every 5 (five) minutes x 3 doses as needed for chest pain. 75 tablet 3   nystatin cream (MYCOSTATIN) Apply 1 Application topically 2 (two) times daily as needed (rash).     ranolazine (RANEXA) 1000 MG SR tablet Take 1 tablet (1,000 mg total) by mouth 2 (two) times daily. 180 tablet 3   sacubitril-valsartan (ENTRESTO) 24-26 MG Take 1 tablet by mouth 2 (two) times daily. 60 tablet  11   spironolactone (ALDACTONE) 25 MG tablet Take 1 tablet (25 mg total) by mouth daily. 90 tablet 3   valACYclovir (VALTREX) 1000 MG tablet Take 1,000 mg by mouth daily.     No current facility-administered medications for this visit.    Allergies  Allergen Reactions   Lisinopril Cough    Dry cough    Social History   Socioeconomic History   Marital status: Married    Spouse name: Nickalos   Number of children: 2   Years of education: Not on file   Highest education level: High school graduate  Occupational History   Occupation: Retired-worked for post office  Tobacco Use   Smoking status: Former    Packs/day: 1.00    Years: 50.00    Additional pack years: 0.00    Total pack years: 50.00    Types: Cigarettes    Quit date: 07/07/2016    Years since quitting: 6.4  Smokeless tobacco: Never  Vaping Use   Vaping Use: Never used  Substance and Sexual Activity   Alcohol use: Not Currently   Drug use: Never   Sexual activity: Not Currently  Other Topics Concern   Not on file  Social History Narrative   Not on file   Social Determinants of Health   Financial Resource Strain: Low Risk  (04/29/2022)   Overall Financial Resource Strain (CARDIA)    Difficulty of Paying Living Expenses: Not hard at all  Food Insecurity: No Food Insecurity (04/29/2022)   Hunger Vital Sign    Worried About Running Out of Food in the Last Year: Never true    Ran Out of Food in the Last Year: Never true  Transportation Needs: No Transportation Needs (04/29/2022)   PRAPARE - Administrator, Civil Service (Medical): No    Lack of Transportation (Non-Medical): No  Physical Activity: Not on file  Stress: Not on file  Social Connections: Not on file  Intimate Partner Violence: Not on file    Family History  Problem Relation Age of Onset   Kidney failure Mother    Heart disease Mother        CHF   Heart disease Sister    Colon cancer Neg Hx    Esophageal cancer Neg Hx    Rectal  cancer Neg Hx    Stomach cancer Neg Hx     Review of Systems:  As stated in the HPI and otherwise negative.   BP (!) 108/56   Pulse 64   Ht 5\' 3"  (1.6 m)   Wt 50.3 kg   BMI 19.66 kg/m   Physical Examination:  General: Well developed, well nourished, NAD  HEENT: OP clear, mucus membranes moist  SKIN: warm, dry. No rashes. Neuro: No focal deficits  Musculoskeletal: Muscle strength 5/5 all ext  Psychiatric: Mood and affect normal  Neck: No JVD, no carotid bruits, no thyromegaly, no lymphadenopathy.  Lungs:Clear bilaterally, no wheezes, rhonci, crackles Cardiovascular: Regular rate and rhythm. No murmurs, gallops or rubs. Abdomen:Soft. Bowel sounds present. Non-tender.  Extremities: No lower extremity edema. Pulses are 2 + in the bilateral DP/PT.  EKG:  EKG is not ordered today. The ekg ordered today demonstrates   Echo September 2023:  1. Left ventricular ejection fraction, by estimation, is 25 to 30%. The  left ventricle has severely decreased function. The left ventricle  demonstrates regional wall motion abnormalities (see scoring  diagram/findings for description). Left ventricular  diastolic parameters are consistent with Grade II diastolic dysfunction  (pseudonormalization). Elevated left ventricular end-diastolic pressure.   2. Right ventricular systolic function is normal. The right ventricular  size is normal. There is mildly elevated pulmonary artery systolic  pressure.   3. Left atrial size was moderately dilated.   4. The mitral valve is normal in structure. Trivial mitral valve  regurgitation. No evidence of mitral stenosis.   5. The aortic valve is normal in structure. Aortic valve regurgitation is  not visualized. No aortic stenosis is present.   6. The inferior vena cava is normal in size with greater than 50%  respiratory variability, suggesting right atrial pressure of 3 mmHg.   Recent Labs: 04/26/2022: Magnesium 1.8 05/31/2022: ALT 19 08/11/2022:  Hemoglobin 12.2; Hemoglobin 12.2; Platelets 406 10/10/2022: B Natriuretic Peptide 514.6; BUN 16; Creatinine, Ser 1.15; Potassium 4.5; Sodium 131   Lipid Panel    Component Value Date/Time   CHOL 153 03/01/2018 0801   TRIG 41 03/01/2018 0801  HDL 93 03/01/2018 0801   CHOLHDL 1.6 03/01/2018 0801   CHOLHDL 2.0 07/07/2016 1914   VLDL 17 07/07/2016 1914   LDLCALC 52 03/01/2018 0801     Wt Readings from Last 3 Encounters:  12/19/22 50.3 kg  12/13/22 51.3 kg  10/10/22 51.3 kg    Assessment and Plan:   1. CAD with stable angina: No chest pain. Continue Plavix, statin, Ranexa and beta blocker.         2. Chronic systolic CHF/Ischemic cardiomyopathy: ICD in place. She is now followed in our Advanced Heart Failure clinic by Dr. Gasper Lloyd. Low cardiac output by right heart cath January 2024. She has not wished to seek advanced therapies such as an LVAD. Will continue beta blocker, Entresto, Jardiance, aldactone and Lasix.     3. Hyperlipidemia: Lipids followed in primary care. Continue statin.   4. Tobacco abuse, in remission: She stopped smoking in 2017.   Labs/ tests ordered today include:  No orders of the defined types were placed in this encounter.  Disposition:   F/U with me in 12 months  Signed, Verne Carrow, MD 12/19/2022 10:27 AM    [facility] [address] [place], [place], [place]  [address] Phone: [phone]; Fax: [phone]

## 2022-12-21 NOTE — Progress Notes (Signed)
Remote ICD transmission.   

## 2023-01-13 ENCOUNTER — Other Ambulatory Visit: Payer: Self-pay | Admitting: Endocrinology

## 2023-02-08 ENCOUNTER — Other Ambulatory Visit: Payer: Self-pay

## 2023-02-08 DIAGNOSIS — I5022 Chronic systolic (congestive) heart failure: Secondary | ICD-10-CM

## 2023-02-08 MED ORDER — RANOLAZINE ER 1000 MG PO TB12: 1000.0000 mg | ORAL_TABLET | Freq: Two times a day (BID) | ORAL | 3 refills | Status: DC

## 2023-02-21 ENCOUNTER — Ambulatory Visit (INDEPENDENT_AMBULATORY_CARE_PROVIDER_SITE_OTHER): Payer: Federal, State, Local not specified - PPO

## 2023-02-21 DIAGNOSIS — I255 Ischemic cardiomyopathy: Secondary | ICD-10-CM

## 2023-02-23 LAB — CUP PACEART REMOTE DEVICE CHECK
Battery Remaining Longevity: 109 mo
Battery Voltage: 3.01 V
HighPow Impedance: 57 Ohm
Implantable Lead Connection Status: 753985
Implantable Lead Implant Date: 20210127
Implantable Lead Location: 753860
Lead Channel Impedance Value: 304 Ohm
Lead Channel Impedance Value: 399 Ohm
Lead Channel Pacing Threshold Amplitude: 0.625 V
Lead Channel Pacing Threshold Pulse Width: 0.4 ms
Lead Channel Sensing Intrinsic Amplitude: 7.875 mV
Lead Channel Setting Pacing Amplitude: 2 V
Lead Channel Setting Pacing Pulse Width: 0.4 ms
Lead Channel Setting Sensing Sensitivity: 0.3 mV
Zone Setting Status: 755011
Zone Setting Status: 755011

## 2023-03-04 ENCOUNTER — Other Ambulatory Visit: Payer: Self-pay | Admitting: Cardiovascular Disease

## 2023-03-09 NOTE — Progress Notes (Signed)
Remote ICD transmission.   

## 2023-03-21 ENCOUNTER — Encounter: Payer: Self-pay | Admitting: Cardiology

## 2023-03-21 ENCOUNTER — Ambulatory Visit: Payer: Federal, State, Local not specified - PPO | Attending: Cardiology | Admitting: Cardiology

## 2023-03-21 VITALS — BP 100/56 | HR 57 | Ht 63.0 in | Wt 110.2 lb

## 2023-03-21 DIAGNOSIS — I2510 Atherosclerotic heart disease of native coronary artery without angina pectoris: Secondary | ICD-10-CM | POA: Diagnosis not present

## 2023-03-21 DIAGNOSIS — I10 Essential (primary) hypertension: Secondary | ICD-10-CM

## 2023-03-21 DIAGNOSIS — I5022 Chronic systolic (congestive) heart failure: Secondary | ICD-10-CM

## 2023-03-21 DIAGNOSIS — I255 Ischemic cardiomyopathy: Secondary | ICD-10-CM | POA: Diagnosis not present

## 2023-03-21 NOTE — Progress Notes (Signed)
Single lead Electrophysiology Office Note:   Date:  03/21/2023  ID:  Pam [name], DOB 19-Apr-1951, MRN [id number]  Primary Cardiologist: Verne Carrow, MD Electrophysiologist: Tylyn Stankovich Jorja Loa, MD       History of Present Illness:   Pam [name] is a 72 y.o. female with h/o CHF seen today for routine electrophysiology followup.  Since last being seen in our clinic the patient reports fatigue at times.  There is some days that she feels well, another is that she wakes up and has trouble getting out of bed and getting moving.  She does not check her blood pressure at home.  Her blood pressure is borderline low today.  she denies chest pain, palpitations, dyspnea, PND, orthopnea, nausea, vomiting, dizziness, syncope, edema, weight gain, or early satiety.      She has a history of coronary artery disease, chronic systolic heart failure, hypertension, hyperlipidemia.  She had a non-STEMI December 2017 was found to have a total occlusion of the LAD.  She had severe calcific stenosis of the circumflex.  Cardiac MRI showed nonviability of the LAD territory.  She is post ICD.  She was admitted to the hospital September 2023 with acute heart failure.  LVAD recommended but she refused.      Review of systems complete and found to be negative unless listed in HPI.      EP Information / Studies Reviewed:    EKG is ordered today. Personal review as below.  EKG Interpretation Date/Time:  Tuesday March 21 2023 10:57:15 EDT Ventricular Rate:  57 PR Interval:  180 QRS Duration:  92 QT Interval:  436 QTC Calculation: 424 R Axis:   86  Text Interpretation: Sinus bradycardia T wave abnormality, consider lateral ischemia When compared with ECG of 05-Aug-2022 10:11, Inverted T waves have replaced nonspecific T wave abnormality in Anterior leads Confirmed by Teiara Baria ([id number]) on 03/21/2023 11:14:53 AM   ICD Interrogation-  reviewed in detail today,  See PACEART report.  Device  History: Medtronic Single Chamber ICD implanted 08/28/19 for chronic systolic heart failure History of appropriate therapy: No History of AAD therapy: No   Risk Assessment/Calculations:              Physical Exam:   VS:  BP (!) 100/56 (BP Location: Left Arm, Patient Position: Sitting, Cuff Size: Normal)   Pulse (!) 57   Ht 5\' 3"  (1.6 m)   Wt 110 lb 3.2 oz (50 kg)   SpO2 96%   BMI 19.52 kg/m    Wt Readings from Last 3 Encounters:  03/21/23 110 lb 3.2 oz (50 kg)  12/19/22 111 lb (50.3 kg)  12/13/22 113 lb 3.2 oz (51.3 kg)     GEN: Well nourished, well developed in no acute distress NECK: No JVD; No carotid bruits CARDIAC: Regular rate and rhythm, no murmurs, rubs, gallops RESPIRATORY:  Clear to auscultation without rales, wheezing or rhonchi  ABDOMEN: Soft, non-tender, non-distended EXTREMITIES:  No edema; No deformity   ASSESSMENT AND PLAN:    1.  Chronic systolic dysfunction s/p Medtronic single chamber ICD  euvolemic today Stable on an appropriate medical regimen Normal ICD function See Pace Art report No changes today  2.  Coronary artery disease: Currently on Plavix, beta-blocker, statin, Ranexa.  No current chest pain.  Plan per primary cardiology.  3.  Hyperlipidemia: Continue Zetia and statin per primary cardiology  4.  Hypertension: Blood pressure is borderline low today.  She does get somewhat fatigued at times.  She has  follow-up with primary cardiology who may switch from Entresto to losartan.  Disposition:   Follow up with EP APP in 12 months   Signed, Wafa Martes Jorja Loa, MD

## 2023-03-21 NOTE — Patient Instructions (Signed)
Medication Instructions:  Your physician recommends that you continue on your current medications as directed. Please refer to the Current Medication list given to you today.  *If you need a refill on your cardiac medications before your next appointment, please call your pharmacy*   Lab Work: None ordered   Testing/Procedures: None ordered   Follow-Up: At [facility], you and your health needs are our priority.  As part of our continuing mission to provide you with exceptional heart care, we have created designated Provider Care Teams.  These Care Teams include your primary Cardiologist (physician) and Advanced Practice Providers (APPs -  Physician Assistants and Nurse Practitioners) who all work together to provide you with the care you need, when you need it.  Remote monitoring is used to monitor your Pacemaker or ICD from home. This monitoring reduces the number of office visits required to check your device to one time per year. It allows [place] to keep an eye on the functioning of your device to ensure it is working properly. You are scheduled for a device check from home on 05/23/23. You may send your transmission at any time that day. If you have a wireless device, the transmission will be sent automatically. After your physician reviews your transmission, you will receive a postcard with your next transmission date.  Your next appointment:   1 year(s)  The format for your next appointment:   In Person  Provider:   You will see one of the following Advanced Practice Providers on your designated Care Team:   Francis Dowse, [place] "Mardelle Matte" [place], [place] Canary Brim, NP   Thank you for choosing [facility]!!   Dory Horn, RN [phone]

## 2023-03-26 NOTE — Progress Notes (Signed)
ADVANCED HEART FAILURE CLINIC NOTE  Referring Physician: Cheron Schaumann.,*  Primary Care: Cheron Schaumann., MD Primary Cardiologist: Dr. Clifton James, Dr. Eden Emms  HPI: Pam [name] is a 72 y.o. female with chronic systolic heart failure secondary to ischemic cardiomyopathy status post ICD, coronary artery disease with CTO of the mid LAD filled with left to left and right to left collaterals presenting today to establish care.  Her cardiac history dates back to December 2017 when she presented with an NSTEMI.  At that time echocardiogram demonstrated LVEF of 35 to 40% with preserved RV function.  Coronary angiography with heavily calcified CTO of the proximal to mid LAD filled by collaterals and calcified 80% stenosis of the circumflex/OM1.  Follow-up cardiac MRI demonstrated nonviable LAD territory.  She ultimately underwent atherectomy and IVUS guided PCI of the proximal circumflex/OM1.  In November 2020 she had a reduction in her LVEF to 25 to 30% and underwent Medtr[employer] ICD placement in early 2021.  According to Pam [name] she did fairly well for a few months however over the past several months has once again had some progression of dyspnea and a decrease in exercise capacity.  She was admitted in September 2023 with acute on chronic CHF exacerbation.  TTE and left heart cath from admission were stable compared to prior.  She was continued on medical [name] for CAD and started on low-dose GDMT for systolic heart failure.  Since that time she has been seen in [facility] clinic where GDMT was further uptitrated.  She also underwent PFTs that were significant for reversible obstructive lung disease and emphysema.  We previously met and discussed possibility of LVAD. At this time Pam [name]. From a functional standpoint, she still goes grocery shopping, performs all ADLs independently; however, has to move very slowly. Her Bps remain in the low 90s with  very little lightheadedness. She is just finishing a course of antibiotics for an upper respiratory infection. Her [name] reports that she is often 'staggering'.   Interval hx:  She has continued to have a steady decline in her functional status with cardiac cachexia, hypotension and intolerance to GDMT. Symptoms are now consistent with NYHA III; she continues to perform all ADLs independently.   Activity level/exercise tolerance: NYHA IIIB Orthopnea:  Sleeps on 2 pillows Paroxysmal noctural dyspnea: Yes but infrequent Chest pain/pressure: Stable at this time Orthostatic lightheadedness: No Palpitations: No Lower extremity edema: No Presyncope/syncope: No Cough: No  Past Medical History:  Diagnosis Date   Abnormal EKG    AICD (automatic cardioverter/defibrillator) present    Alcoholic intoxication without complication (HCC)    Allergy    Cardiomyopathy, ischemic    Cataract    Chest pain 08/29/2016   Chest pain 08/29/2016   CHF (congestive heart failure) (HCC)    Chronic systolic heart failure (HCC) 08/29/2016   Facial laceration    Fall 07/07/2016   Hyperlipidemia    Hypertension 03/03/2017   Hyponatremia    Hypothyroidism 07/08/2016   NSTEMI (non-ST elevated myocardial infarction) (HCC)    Syncope    Thyroid disease     Current Outpatient Medications  Medication Sig Dispense Refill   acetaminophen (TYLENOL) 500 MG tablet Take 1,000 mg by mouth every 6 (six) hours as needed for moderate pain.     albuterol (VENTOLIN HFA) 108 (90 Base) MCG/ACT inhaler Inhale 2 puffs into the lungs every 6 (six) hours as needed for wheezing or shortness of breath. 8 g 6  alendronate (FOSAMAX) 70 MG tablet TAKE 1 TABLET(70 MG) BY MOUTH 1 TIME A WEEK WITH A FULL GLASS OF WATER AND ON AN EMPTY STOMACH 12 tablet 1   ANORO ELLIPTA 62.5-25 MCG/ACT AEPB INHALE 1 PUFF INTO THE LUNGS DAILY 60 each 5   atorvastatin (LIPITOR) 80 MG tablet TAKE 1 TABLET(80 MG) BY MOUTH DAILY 90 tablet 1   bisoprolol  (ZEBETA) 5 MG tablet Take 0.5 tablets (2.5 mg total) by mouth daily. 45 tablet 3   Cholecalciferol (VITAMIN D3) 50 MCG (2000 UT) TABS Take 2,000 mcg by mouth daily.     clopidogrel (PLAVIX) 75 MG tablet TAKE 1 TABLET(75 MG) BY MOUTH DAILY WITH BREAKFAST 90 tablet 2   empagliflozin (JARDIANCE) 10 MG TABS tablet Take 1 tablet (10 mg total) by mouth daily. 30 tablet 11   furosemide (LASIX) 20 MG tablet Take 1 tablet (20 mg total) by mouth as needed. 30 tablet 10   levothyroxine (SYNTHROID) 25 MCG tablet Take 37.5 mcg by mouth daily before breakfast.     nitroGLYCERIN (NITROSTAT) 0.4 MG SL tablet Place 1 tablet (0.4 mg total) under the tongue every 5 (five) minutes x 3 doses as needed for chest pain. 75 tablet 3   nystatin cream (MYCOSTATIN) Apply 1 Application topically 2 (two) times daily as needed (rash).     ranolazine (RANEXA) 1000 MG SR tablet Take 1 tablet (1,000 mg total) by mouth 2 (two) times daily. 180 tablet 3   sacubitril-valsartan (ENTRESTO) 24-26 MG Take 1 tablet by mouth 2 (two) times daily. 60 tablet 11   spironolactone (ALDACTONE) 25 MG tablet Take 1 tablet (25 mg total) by mouth daily. 90 tablet 3   valACYclovir (VALTREX) 1000 MG tablet Take 1,000 mg by mouth daily.     No current facility-administered medications for this visit.    Allergies  Allergen Reactions   Lisinopril Cough    Dry cough      Social History   Socioeconomic History   Marital status: Married    Spouse name: TEFL teacher   Number of children: 2   Years of education: Not on file   Highest education level: High school graduate  Occupational History   Occupation: Retired-worked for post [employer]  Tobacco Use   Smoking status: Former    Current packs/day: 0.00    Average packs/day: 1 pack/day for 50.0 years (50.0 ttl pk-yrs)    Types: Cigarettes    Start date: 07/07/1966    Quit date: 07/07/2016    Years since quitting: 6.7   Smokeless tobacco: Never  Vaping Use   Vaping status: Never Used  Substance  and Sexual Activity   Alcohol use: Not Currently   Drug use: Never   Sexual activity: Not Currently  Other Topics Concern   Not on file  Social History Narrative   Not on file   Social Determinants of Health   Financial Resource Strain: Low Risk  (04/29/2022)   Overall Financial Resource Strain (CARDIA)    Difficulty of Paying Living Expenses: Not hard at all  Food Insecurity: Low Risk  (01/02/2023)   [facility]   Food vital sign    Within the past 12 months, you worried that your food would run out before you got money to buy more: Never true    Within the past 12 months, the food you bought just didn't last and you didn't have money to get more. : Never true  Transportation Needs: No Transportation Needs (01/02/2023)  [facility]   Transportation    In the past 12 months, has lack of reliable transportation kept you from medical appointments, meetings, work or from getting things needed for daily living? : No  Physical Activity: Not on file  Stress: Not on file  Social Connections: Not on file  Intimate Partner Violence: Not on file      Family History  Problem Relation Age of Onset   Kidney failure Mother    Heart disease Mother        CHF   Heart disease Sister    Colon cancer Neg Hx    Esophageal cancer Neg Hx    Rectal cancer Neg Hx    Stomach cancer Neg Hx     PHYSICAL EXAM: Vitals:   03/27/23 0946  BP: 110/64  Pulse: (!) 57  SpO2: 98%   GENERAL: Well nourished, well developed, and in no apparent distress at rest.  HEENT: Negative for arcus senilis or xanthelasma. There is no scleral icterus.  The mucous membranes are pink and moist.   NECK: Supple, No masses. Normal carotid upstrokes without bruits. No masses or thyromegaly.    CHEST: There are no chest wall deformities. There is no chest wall tenderness. Respirations are unlabored.  Lungs- CTA B/L CARDIAC:  JVP: 7 cm          Normal rate with  regular rhythm. No murmurs, rubs or gallops.  Pulses are 2+ and symmetrical in upper and lower extremities. No edema.  ABDOMEN: Soft, non-tender, non-distended. There are no masses or hepatomegaly. There are normal bowel sounds.  EXTREMITIES: Warm and well perfused with no cyanosis, clubbing.  LYMPHATIC: No axillary or supraclavicular lymphadenopathy.  NEUROLOGIC: Patient is oriented x3 with no focal or lateralizing neurologic deficits.  PSYCH: Patients affect is appropriate, there is no evidence of anxiety or depression.  SKIN: Warm and dry; no lesions or wounds.     DATA REVIEW  ECG: Normal sinus rhythm  ECHO: 04/27/22: LVEF 25%-30%, RV function normal.  06/19/19: LVEF 25%-30%, Normal RV function.   CMR:  12/17: . Normal size and thickness of the left ventricle. Systolic function is moderately decreased (LVEF = 38%).  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 There is akinesis of the apical lateral, mid anterior, anteroseptal walls.  The LAD territory is non-viable, RCA and LCX territory is viable.  2. Normal right ventricular size, thickness and systolic function (RVEF = 54%) with no regional wall motion abnormalities.  3.  Normal biatrial size.  4.  Mild mitral and mild to moderate tricuspid regurgitation.  5. Normal size of the aortic root, ascending aorta and pulmonary artery.  6.  Normal pericardium, trivial pericardial effusion.   CATH: 04/29/22:  Prox LAD to Mid LAD lesion is 90% stenosed with 95% stenosed side branch in 1st Sept. Mid LAD to Dist LAD lesion is 100% stenosed.   Previously placed Prox Cx to Dist Cx stent of unknown type is  widely patent with 0% stenosed side branch in 1st Mrg.   Lat 1st Mrg lesion is 50% stenosed.   LV end diastolic pressure is moderately elevated.   There is no aortic valve stenosis.  08/11/22, RHC: HEMODYNAMICS: RA:                  1 mmHg (mean) RV:                  27/1 mmHg  PA:                   31/5 mmHg (14 mean) PCWP:            5 mmHg (mean)                                      Estimated Fick CO/CI   2.87 L/min, 1.89 L/min/m2                                          TPG                 9  mmHg                                              PVR                 3.14 Wood Units  PAPi                >4  ASSESSMENT & PLAN:  NYHA IIB-III, Stage C, Systolic heart failrue Etiology of HF: Secondary to ischemic cardiomyopathy with nonviable myocardium as reported above NYHA class / AHA Stage: NYHA III Volume status & Diuretics: Low filling pressures on RHC=; will take lasix PRN only now.  Vasodilators: Decrease Entresto to 12/13mg  BID. Beta-Blocker: decrease bisoprolol to 2.5mg  BID. Start digoxin daily. Repeat labs today.   MRA: Continue spironolactone Cardiometabolic: Jardiance 10 mg daily Devices therapies & Valvulopathies: Primary prevention ICD in place Advanced therapies: CPX w/ VE/VCO2 slop of 69.4 and PVO2 of 13.2; CPX consistent with moderate to severe cardiac limitation in addition to obstructive lung disease. I had a lengthy discussion with the patient today regarding advanced therapies. Her COPD, age and frailty make her a marginal LVAD candidate, however, she has no absolute contraindications to durable VAD support. RHC last week with cardiac index of 1.89L/min/m2. Today we had a very length discussion with our LVAD coordinator Pam [name]. We discussed all her prior testing including CPX, RHC, echocardiograms and prognosis of patients with stage D heart failure. We also discussed our LVAD outcomes and national outcomes. After multiple lengthy discussions with Pam [name] and her [name], they have decided against LVAD at this time. She is slowly showing signs of progressive LV failure including hypotension, bradycardia; we will likely have to start decreasing GDMT doses moving forward.  12/13/22: I remain concerned about her prognosis over the next year. She  continues to lose weight secondary to cardiac cachexia. She is otherwise very independent with good social support, however, not interested in advanced therapies including LVAD, BATwire, etc. Will repeat labs today. No med changes.   2.  COPD -Now followed by pulmonology; started on inhalers.   3.  Coronary artery disease -Management as per primary cardiology  Pam [name] Advanced Heart Failure Mechanical Circulatory Support

## 2023-03-27 ENCOUNTER — Ambulatory Visit (HOSPITAL_COMMUNITY)
Admission: RE | Admit: 2023-03-27 | Discharge: 2023-03-27 | Disposition: A | Discharge status: Home / Self Care | Payer: Federal, State, Local not specified - PPO | Source: Ambulatory Visit | Attending: Cardiology | Admitting: Cardiology

## 2023-03-27 ENCOUNTER — Encounter (HOSPITAL_COMMUNITY): Payer: Self-pay | Admitting: Cardiology

## 2023-03-27 VITALS — BP 110/64 | HR 57 | Wt 110.0 lb

## 2023-03-27 DIAGNOSIS — Z8249 Family history of ischemic heart disease and other diseases of the circulatory system: Secondary | ICD-10-CM | POA: Insufficient documentation

## 2023-03-27 DIAGNOSIS — I110 Hypertensive heart disease with heart failure: Secondary | ICD-10-CM | POA: Insufficient documentation

## 2023-03-27 DIAGNOSIS — J439 Emphysema, unspecified: Secondary | ICD-10-CM | POA: Insufficient documentation

## 2023-03-27 DIAGNOSIS — Z955 Presence of coronary angioplasty implant and graft: Secondary | ICD-10-CM | POA: Insufficient documentation

## 2023-03-27 DIAGNOSIS — Z79899 Other long term (current) drug therapy: Secondary | ICD-10-CM | POA: Insufficient documentation

## 2023-03-27 DIAGNOSIS — Z87891 Personal history of nicotine dependence: Secondary | ICD-10-CM | POA: Diagnosis not present

## 2023-03-27 DIAGNOSIS — I25119 Atherosclerotic heart disease of native coronary artery with unspecified angina pectoris: Secondary | ICD-10-CM | POA: Diagnosis not present

## 2023-03-27 DIAGNOSIS — I5022 Chronic systolic (congestive) heart failure: Secondary | ICD-10-CM | POA: Diagnosis not present

## 2023-03-27 DIAGNOSIS — Z95810 Presence of automatic (implantable) cardiac defibrillator: Secondary | ICD-10-CM | POA: Diagnosis not present

## 2023-03-27 DIAGNOSIS — I255 Ischemic cardiomyopathy: Secondary | ICD-10-CM | POA: Diagnosis not present

## 2023-03-27 DIAGNOSIS — I2510 Atherosclerotic heart disease of native coronary artery without angina pectoris: Secondary | ICD-10-CM | POA: Diagnosis not present

## 2023-03-27 DIAGNOSIS — E88A Wasting disease (syndrome) due to underlying condition: Secondary | ICD-10-CM | POA: Insufficient documentation

## 2023-03-27 DIAGNOSIS — I252 Old myocardial infarction: Secondary | ICD-10-CM | POA: Diagnosis not present

## 2023-03-27 LAB — BASIC METABOLIC PANEL
Anion gap: 8 (ref 5–15)
BUN: 16 mg/dL (ref 8–23)
CO2: 26 mmol/L (ref 22–32)
Calcium: 9.6 mg/dL (ref 8.9–10.3)
Chloride: 98 mmol/L (ref 98–111)
Creatinine, Ser: 1.22 mg/dL — ABNORMAL HIGH (ref 0.44–1.00)
GFR, Estimated: 47 mL/min — ABNORMAL LOW (ref >60)
Glucose, Bld: 86 mg/dL (ref 70–99)
Potassium: 4.6 mmol/L (ref 3.5–5.1)
Sodium: 132 mmol/L — ABNORMAL LOW (ref 135–145)

## 2023-03-27 LAB — BRAIN NATRIURETIC PEPTIDE: B Natriuretic Peptide: 558.1 pg/mL — ABNORMAL HIGH (ref 0.0–100.0)

## 2023-03-27 MED ORDER — DIGOXIN 125 MCG PO TABS: 0.1250 mg | ORAL_TABLET | Freq: Every day | ORAL | 3 refills | Status: DC

## 2023-03-27 MED ORDER — NITROGLYCERIN 0.4 MG SL SUBL: 0.4000 mg | SUBLINGUAL_TABLET | SUBLINGUAL | 3 refills | Status: DC | PRN

## 2023-03-27 MED ORDER — ENTRESTO 24-26 MG PO TABS: ORAL_TABLET | ORAL | 11 refills | Status: DC

## 2023-03-27 MED ORDER — ENTRESTO 24-26 MG PO TABS: 5.0000 | ORAL_TABLET | Freq: Two times a day (BID) | ORAL | 11 refills | Status: DC

## 2023-03-27 NOTE — Addendum Note (Signed)
Encounter addended by: Theresia Bough, CMA on: 03/27/2023 4:28 PM  Actions taken: Order list changed

## 2023-03-27 NOTE — Patient Instructions (Addendum)
Good to see you today!  DECREASE Entresto to 12/13 mg ( 1/2 tablet) Twice daily  Start Digoxin 0.125 mg daily  Lasix 40 mg x 3 days  Labs done today, your results will be available in MyChart, we will contact you for abnormal readings.  Follow up lab work in 10 days  Your physician recommends that you schedule a follow-up appointment in: 1 month  If you have any questions or concerns before your next appointment please send [place] a message through [place] or call our office at [phone].    TO LEAVE A MESSAGE FOR THE NURSE SELECT OPTION 2, PLEASE LEAVE A MESSAGE INCLUDING: YOUR NAME DATE OF BIRTH CALL BACK NUMBER REASON FOR CALL**this is important as we prioritize the call backs  YOU WILL RECEIVE A CALL BACK THE SAME DAY AS LONG AS YOU CALL BEFORE 4:00 PM  At the [facility], you and your health needs are our priority. As part of our continuing mission to provide you with exceptional heart care, we have created designated Provider Care Teams. These Care Teams include your primary Cardiologist (physician) and Advanced Practice Providers (APPs- Physician Assistants and Nurse Practitioners) who all work together to provide you with the care you need, when you need it.   You may see any of the following providers on your designated Care Team at your next follow up: Dr Arvilla Meres Dr Marca Ancona Dr. Marcos Eke, NP Robbie Lis, [place] [facility] [place], [place] Brynda Peon, NP Karle Plumber, PharmD   Please be sure to bring in all your medications bottles to every appointment.    Thank you for choosing [facility]

## 2023-04-06 ENCOUNTER — Ambulatory Visit (HOSPITAL_COMMUNITY)
Admission: RE | Admit: 2023-04-06 | Discharge: 2023-04-06 | Disposition: A | Discharge status: Home / Self Care | Payer: Federal, State, Local not specified - PPO | Source: Ambulatory Visit | Attending: Cardiology | Admitting: Cardiology

## 2023-04-06 DIAGNOSIS — I255 Ischemic cardiomyopathy: Secondary | ICD-10-CM | POA: Diagnosis present

## 2023-05-02 ENCOUNTER — Other Ambulatory Visit (HOSPITAL_COMMUNITY): Payer: Self-pay | Admitting: Cardiology

## 2023-05-11 NOTE — Progress Notes (Signed)
ADVANCED HEART FAILURE CLINIC NOTE  Referring Physician: Cheron Schaumann.,*  Primary Care: Cheron Schaumann., MD Primary Cardiologist: Dr. Clifton James, Dr. Eden Emms  HPI: Pam [name] is a 72 y.o. female with chronic systolic heart failure secondary to ischemic cardiomyopathy status post ICD, coronary artery disease with CTO of the mid LAD filled with left to left and right to left collaterals presenting today to establish care.  Her cardiac history dates back to December 2017 when she presented with an NSTEMI.  At that time echocardiogram demonstrated LVEF of 35 to 40% with preserved RV function.  Coronary angiography with heavily calcified CTO of the proximal to mid LAD filled by collaterals and calcified 80% stenosis of the circumflex/OM1.  Follow-up cardiac MRI demonstrated nonviable LAD territory.  She ultimately underwent atherectomy and IVUS guided PCI of the proximal circumflex/OM1.  In November 2020 she had a reduction in her LVEF to 25 to 30% and underwent Med[employer] ICD placement in early 2021.  According to Pam [name] she did fairly well for a few months however over the past several months has once again had some progression of dyspnea and a decrease in exercise capacity.  She was admitted in September 2023 with acute on chronic CHF exacerbation.  TTE and left heart cath from admission were stable compared to prior.  She was continued on medical therapy for CAD and started on low-dose GDMT for systolic heart failure.  Since that time she has been seen in [facility] clinic where GDMT was further uptitrated.  She also underwent PFTs that were significant for reversible obstructive lung disease and emphysema.  We previously met and discussed possibility of LVAD. At this time Pam [name] and her husband have decided against LVAD therapy. From a functional standpoint, she still goes grocery shopping, performs all ADLs independently; however, has to move very slowly. Her Bps remain in the low 90s with  very little lightheadedness. She is just finishing a course of antibiotics for an upper respiratory infection. Her husband reports that she is often 'staggering'.   Interval hx:  Continued to have a steady decline in functional status; she continues to go walking; has an ankle sprain. She is now taking Entresto 24/26mg  BID rather than 12/13mg  BID; no lightheadness.   Activity level/exercise tolerance: NYHA IIIB Orthopnea:  Sleeps on 2 pillows Paroxysmal noctural dyspnea: Yes but infrequent Chest pain/pressure: Stable at this time Orthostatic lightheadedness: No Palpitations: No Lower extremity edema: No Presyncope/syncope: No Cough: No  Past Medical History:  Diagnosis Date   Abnormal EKG    AICD (automatic cardioverter/defibrillator) present    Alcoholic intoxication without complication (HCC)    Allergy    Cardiomyopathy, ischemic    Cataract    Chest pain 08/29/2016   Chest pain 08/29/2016   CHF (congestive heart failure) (HCC)    Chronic systolic heart failure (HCC) 08/29/2016   Facial laceration    Fall 07/07/2016   Hyperlipidemia    Hypertension 03/03/2017   Hyponatremia    Hypothyroidism 07/08/2016   NSTEMI (non-ST elevated myocardial infarction) (HCC)    Syncope    Thyroid disease     Current Outpatient Medications  Medication Sig Dispense Refill   acetaminophen (TYLENOL) 500 MG tablet Take 1,000 mg by mouth every 6 (six) hours as needed for moderate pain.     albuterol (VENTOLIN HFA) 108 (90 Base) MCG/ACT inhaler Inhale 2 puffs into the lungs every 6 (six) hours as needed for wheezing or shortness of breath. 8 g 6   alendronate (  FOSAMAX) 70 MG tablet TAKE 1 TABLET(70 MG) BY MOUTH 1 TIME A WEEK WITH A FULL GLASS OF WATER AND ON AN EMPTY STOMACH 12 tablet 1   ANORO ELLIPTA 62.5-25 MCG/ACT AEPB INHALE 1 PUFF INTO THE LUNGS DAILY 60 each 5   atorvastatin (LIPITOR) 80 MG tablet TAKE 1 TABLET(80 MG) BY MOUTH DAILY 90 tablet 1   bisoprolol (ZEBETA) 5 MG tablet Take 0.5  tablets (2.5 mg total) by mouth daily. 45 tablet 3   Cholecalciferol (VITAMIN D3) 50 MCG (2000 UT) TABS Take 2,000 mcg by mouth daily.     clopidogrel (PLAVIX) 75 MG tablet TAKE 1 TABLET(75 MG) BY MOUTH DAILY WITH BREAKFAST 90 tablet 2   digoxin (LANOXIN) 0.125 MG tablet Take 1 tablet (0.125 mg total) by mouth daily. 90 tablet 3   furosemide (LASIX) 20 MG tablet Take 1 tablet (20 mg total) by mouth as needed. 30 tablet 10   JARDIANCE 10 MG TABS tablet TAKE 1 TABLET(10 MG) BY MOUTH DAILY 30 tablet 11   levothyroxine (SYNTHROID) 25 MCG tablet Take 37.5 mcg by mouth daily before breakfast.     nitroGLYCERIN (NITROSTAT) 0.4 MG SL tablet Place 1 tablet (0.4 mg total) under the tongue every 5 (five) minutes x 3 doses as needed for chest pain. 75 tablet 3   nystatin cream (MYCOSTATIN) Apply 1 Application topically 2 (two) times daily as needed (rash).     ranolazine (RANEXA) 1000 MG SR tablet Take 1 tablet (1,000 mg total) by mouth 2 (two) times daily. 180 tablet 3   spironolactone (ALDACTONE) 25 MG tablet Take 1 tablet (25 mg total) by mouth daily. 90 tablet 3   valACYclovir (VALTREX) 1000 MG tablet Take 1,000 mg by mouth daily.     sacubitril-valsartan (ENTRESTO) 24-26 MG Take 1 tablet by mouth 2 (two) times daily. One half tab twice a day 60 tablet 11   No current facility-administered medications for this encounter.    Allergies  Allergen Reactions   Lisinopril Cough    Dry cough      Social History   Socioeconomic History   Marital status: Married    Spouse name: TEFL teacher   Number of children: 2   Years of education: Not on file   Highest education level: High school graduate  Occupational History   Occupation: Retired-worked for post office  Tobacco Use   Smoking status: Former    Current packs/day: 0.00    Average packs/day: 1 pack/day for 50.0 years (50.0 ttl pk-yrs)    Types: Cigarettes    Start date: 07/07/1966    Quit date: 07/07/2016    Years since quitting: 6.8    Smokeless tobacco: Never  Vaping Use   Vaping status: Never Used  Substance and Sexual Activity   Alcohol use: Not Currently   Drug use: Never   Sexual activity: Not Currently  Other Topics Concern   Not on file  Social History Narrative   Not on file   Social Determinants of Health   Financial Resource Strain: Low Risk  (04/29/2022)   Overall Financial Resource Strain (CARDIA)    Difficulty of Paying Living Expenses: Not hard at all  Food Insecurity: Low Risk  (01/02/2023)   [facility]   Hunger Vital Sign    Worried About Running Out of Food in the Last Year: Never true    Ran Out of Food in the Last Year: Never true  Transportation Needs: No Transportation Needs (01/02/2023)   [facility]   Transportation    In the past 12 months, has lack of reliable transportation kept you from medical appointments, meetings, work or from getting things needed for daily living? : No  Physical Activity: Not on file  Stress: Not on file  Social Connections: Not on file  Intimate Partner Violence: Not on file      Family History  Problem Relation Age of Onset   Kidney failure Mother    Heart disease Mother        CHF   Heart disease Sister    Colon cancer Neg Hx    Esophageal cancer Neg Hx    Rectal cancer Neg Hx    Stomach cancer Neg Hx     PHYSICAL EXAM: Vitals:   05/12/23 1035  BP: (!) 120/58  Pulse: (!) 49  SpO2: 97%   GENERAL: Well nourished, well developed, and in no apparent distress at rest.  HEENT: Negative for arcus senilis or xanthelasma. There is no scleral icterus.  The mucous membranes are pink and moist.   NECK: Supple, No masses. Normal carotid upstrokes without bruits. No masses or thyromegaly.    CHEST: There are no chest wall deformities. There is no chest wall tenderness. Respirations are unlabored.  Lungs- CTA B/L CARDIAC:  JVP: 7 cm          Normal rate with regular rhythm. No murmurs, rubs or  gallops.  Pulses are 2+ and symmetrical in upper and lower extremities. No edema.  ABDOMEN: Soft, non-tender, non-distended. There are no masses or hepatomegaly. There are normal bowel sounds.  EXTREMITIES: Warm and well perfused with no cyanosis, clubbing.  LYMPHATIC: No axillary or supraclavicular lymphadenopathy.  NEUROLOGIC: Patient is oriented x3 with no focal or lateralizing neurologic deficits.  PSYCH: Patients affect is appropriate, there is no evidence of anxiety or depression.  SKIN: Warm and dry; no lesions or wounds.     DATA REVIEW  ECG: Normal sinus rhythm  ECHO: 04/27/22: LVEF 25%-30%, RV function normal.  06/19/19: LVEF 25%-30%, Normal RV function.   CMR:  12/17: . Normal size and thickness of the left ventricle. Systolic function is moderately decreased (LVEF = 38%).  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 There is akinesis of the apical lateral, mid anterior, anteroseptal walls.  The LAD territory is non-viable, RCA and LCX territory is viable.  2. Normal right ventricular size, thickness and systolic function (RVEF = 54%) with no regional wall motion abnormalities.  3.  Normal biatrial size.  4.  Mild mitral and mild to moderate tricuspid regurgitation.  5. Normal size of the aortic root, ascending aorta and pulmonary artery.  6.  Normal pericardium, trivial pericardial effusion.   CATH: 04/29/22:  Prox LAD to Mid LAD lesion is 90% stenosed with 95% stenosed side branch in 1st Sept. Mid LAD to Dist LAD lesion is 100% stenosed.   Previously placed Prox Cx to Dist Cx stent of unknown type is  widely patent with 0% stenosed side branch in 1st Mrg.   Lat 1st Mrg lesion is 50% stenosed.   LV end diastolic pressure is moderately elevated.   There is no aortic valve stenosis.  08/11/22, RHC: HEMODYNAMICS: RA:                  1 mmHg (mean) RV:                  27/1 mmHg  PA:                  31/5 mmHg (14  mean) PCWP:            5 mmHg (mean)                                      Estimated Fick CO/CI   2.87 L/min, 1.89 L/min/m2                                          TPG                 9  mmHg                                              PVR                 3.14 Wood Units  PAPi                >4  ASSESSMENT & PLAN:  NYHA IIB-III, Stage C, Systolic heart failrue Etiology of HF: Secondary to ischemic cardiomyopathy with nonviable myocardium as reported above NYHA class / AHA Stage: NYHA III Volume status & Diuretics: Low filling pressures on RHC; will take lasix PRN only now.  Vasodilators: Increase Entresto to 24/26mg  BID (she has been taking this).  Beta-Blocker: continue bisoprolol to 2.5mg  BID. Continue digoxin daily. Obtain digoxin level today. MRA: Continue spironolactone 25mg  daily Cardiometabolic: Jardiance 10 mg daily Devices therapies & Valvulopathies: Primary prevention ICD in place Advanced therapies: CPX w/ VE/VCO2 slop of 69.4 and PVO2 of 13.2; CPX consistent with moderate to severe cardiac limitation in addition to obstructive lung disease. I had a lengthy discussion with the patient today regarding advanced therapies. Her COPD, age and frailty make her a marginal LVAD candidate, however, she has no absolute contraindications to durable VAD support. RHC last week with cardiac index of 1.89L/min/m2. Today we had a very length discussion with our LVAD coordinator Pam [name]. We discussed all her prior testing including CPX, RHC, echocardiograms and prognosis of patients with stage D heart failure. We also discussed our LVAD outcomes and national outcomes. After multiple lengthy discussions with Ms. Subia and her husband, they have decided against LVAD at this time. She is slowly showing signs of progressive LV failure including hypotension, bradycardia; we will likely have to start decreasing GDMT doses moving forward.  12/13/22: I remain concerned about her prognosis over  the next year. She continues to lose weight secondary to cardiac cachexia. She is otherwise very independent with good social support, however, not interested in advanced therapies including LVAD, BATwire, etc. Will repeat labs today. No med changes.   2.  COPD -Now followed by pulmonology; started on inhalers.  - Stable symptoms; no wheezing on exam today. Compliant.   3.  Coronary artery disease -Management as per primary cardiology -Reports no significant chest pain - Continue ranexa for angina.   4. Primary prevention ICD - Interrogation today; no VT/VF - normal optivol  I spent 42 minutes caring for this patient today including face to face time, ordering and reviewing labs, discussing need for advanced therapies (  not currently interested), seeing the patient, documenting in the record, and arranging follow ups.    Pam [name] Advanced Heart Failure Mechanical Circulatory Support

## 2023-05-12 ENCOUNTER — Telehealth (HOSPITAL_COMMUNITY): Payer: Self-pay

## 2023-05-12 ENCOUNTER — Encounter (HOSPITAL_COMMUNITY): Payer: Self-pay | Admitting: Cardiology

## 2023-05-12 ENCOUNTER — Other Ambulatory Visit (HOSPITAL_COMMUNITY): Payer: Self-pay | Admitting: Cardiology

## 2023-05-12 ENCOUNTER — Other Ambulatory Visit (HOSPITAL_COMMUNITY): Payer: Self-pay

## 2023-05-12 ENCOUNTER — Ambulatory Visit (HOSPITAL_COMMUNITY)
Admission: RE | Admit: 2023-05-12 | Discharge: 2023-05-12 | Disposition: A | Discharge status: Home / Self Care | Payer: Federal, State, Local not specified - PPO | Source: Ambulatory Visit | Attending: Cardiology | Admitting: Cardiology

## 2023-05-12 VITALS — BP 120/58 | HR 49 | Wt 109.4 lb

## 2023-05-12 DIAGNOSIS — I110 Hypertensive heart disease with heart failure: Secondary | ICD-10-CM | POA: Insufficient documentation

## 2023-05-12 DIAGNOSIS — I25119 Atherosclerotic heart disease of native coronary artery with unspecified angina pectoris: Secondary | ICD-10-CM | POA: Insufficient documentation

## 2023-05-12 DIAGNOSIS — I5020 Unspecified systolic (congestive) heart failure: Secondary | ICD-10-CM | POA: Diagnosis not present

## 2023-05-12 DIAGNOSIS — I5A Non-ischemic myocardial injury (non-traumatic): Secondary | ICD-10-CM | POA: Diagnosis not present

## 2023-05-12 DIAGNOSIS — J439 Emphysema, unspecified: Secondary | ICD-10-CM | POA: Insufficient documentation

## 2023-05-12 DIAGNOSIS — Z7984 Long term (current) use of oral hypoglycemic drugs: Secondary | ICD-10-CM | POA: Insufficient documentation

## 2023-05-12 DIAGNOSIS — I5022 Chronic systolic (congestive) heart failure: Secondary | ICD-10-CM | POA: Insufficient documentation

## 2023-05-12 DIAGNOSIS — Z4502 Encounter for adjustment and management of automatic implantable cardiac defibrillator: Secondary | ICD-10-CM | POA: Insufficient documentation

## 2023-05-12 DIAGNOSIS — R54 Age-related physical debility: Secondary | ICD-10-CM | POA: Diagnosis not present

## 2023-05-12 DIAGNOSIS — I255 Ischemic cardiomyopathy: Secondary | ICD-10-CM | POA: Diagnosis not present

## 2023-05-12 DIAGNOSIS — J449 Chronic obstructive pulmonary disease, unspecified: Secondary | ICD-10-CM | POA: Diagnosis not present

## 2023-05-12 DIAGNOSIS — E88A Wasting disease (syndrome) due to underlying condition: Secondary | ICD-10-CM | POA: Diagnosis not present

## 2023-05-12 DIAGNOSIS — Z79899 Other long term (current) drug therapy: Secondary | ICD-10-CM | POA: Insufficient documentation

## 2023-05-12 DIAGNOSIS — Z95810 Presence of automatic (implantable) cardiac defibrillator: Secondary | ICD-10-CM | POA: Insufficient documentation

## 2023-05-12 DIAGNOSIS — I252 Old myocardial infarction: Secondary | ICD-10-CM | POA: Insufficient documentation

## 2023-05-12 LAB — BASIC METABOLIC PANEL
Anion gap: 10 (ref 5–15)
BUN: 14 mg/dL (ref 8–23)
CO2: 24 mmol/L (ref 22–32)
Calcium: 9.7 mg/dL (ref 8.9–10.3)
Chloride: 97 mmol/L — ABNORMAL LOW (ref 98–111)
Creatinine, Ser: 1.21 mg/dL — ABNORMAL HIGH (ref 0.44–1.00)
GFR, Estimated: 48 mL/min — ABNORMAL LOW (ref >60)
Glucose, Bld: 90 mg/dL (ref 70–99)
Potassium: 5.1 mmol/L (ref 3.5–5.1)
Sodium: 131 mmol/L — ABNORMAL LOW (ref 135–145)

## 2023-05-12 LAB — BRAIN NATRIURETIC PEPTIDE: B Natriuretic Peptide: 669.7 pg/mL — ABNORMAL HIGH (ref 0.0–100.0)

## 2023-05-12 LAB — DIGOXIN LEVEL: Digoxin Level: 1.5 ng/mL (ref 0.8–2.0)

## 2023-05-12 MED ORDER — SPIRONOLACTONE 25 MG PO TABS: 12.5000 mg | ORAL_TABLET | Freq: Every day | ORAL | Status: DC

## 2023-05-12 MED ORDER — ENTRESTO 24-26 MG PO TABS: 1.0000 | ORAL_TABLET | Freq: Two times a day (BID) | ORAL | 11 refills | Status: DC

## 2023-05-12 NOTE — Patient Instructions (Signed)
Medication Changes:  START: ENTRESTO 24/26 MG TWICE DAILY   Lab Work:  Labs done today, your results will be available in MyChart, we will contact you for abnormal readings.  Follow-Up in: 2 MONTHS AS SCHEDULED   At the [facility], you and your health needs are our priority. We have a designated team specialized in the treatment of Heart Failure. This Care Team includes your primary Heart Failure Specialized Cardiologist (physician), Advanced Practice Providers (APPs- Physician Assistants and Nurse Practitioners), and Pharmacist who all work together to provide you with the care you need, when you need it.   You may see any of the following providers on your designated Care Team at your next follow up:  Dr. Arvilla Meres Dr. Marca Ancona Dr. Dorthula Nettles Dr. Theresia Bough Tonye Becket, NP Robbie Lis, [place] [facility] [place], [place] Brynda Peon, NP [place] Lee, NP Karle Plumber, PharmD   Please be sure to bring in all your medications bottles to every appointment.   Need to Contact us:  If you have any questions or concerns before your next appointment please send [place] a message through [place] or call our office at [phone].    TO LEAVE A MESSAGE FOR THE NURSE SELECT OPTION 2, PLEASE LEAVE A MESSAGE INCLUDING: YOUR NAME DATE OF BIRTH CALL BACK NUMBER REASON FOR CALL**this is important as we prioritize the call backs  YOU WILL RECEIVE A CALL BACK THE SAME DAY AS LONG AS YOU CALL BEFORE 4:00 PM

## 2023-05-12 NOTE — Telephone Encounter (Signed)
Patient advised and verbalized understanding. Med list updated to reflect changes. Meds ordered this encounter  Medications   spironolactone (ALDACTONE) 25 MG tablet    Sig: Take 0.5 tablets (12.5 mg total) by mouth daily.    Please cancel all previous orders for current medication. Change in dosage or pill size.

## 2023-05-12 NOTE — Telephone Encounter (Signed)
-----   Message from Dorthula Nettles sent at 05/12/2023  5:04 PM EDT ----- Can we please ask Ms. Faye to hold her digoxin for the next 3-4 days and decrease spironolactone to 12.5mg .

## 2023-05-16 ENCOUNTER — Other Ambulatory Visit (HOSPITAL_COMMUNITY): Payer: Self-pay | Admitting: Cardiology

## 2023-05-16 MED ORDER — ENTRESTO 24-26 MG PO TABS: 1.0000 | ORAL_TABLET | Freq: Two times a day (BID) | ORAL | 11 refills | Status: DC

## 2023-05-16 NOTE — Telephone Encounter (Signed)
Correct dose of entresto sent to pharm

## 2023-05-23 ENCOUNTER — Ambulatory Visit (INDEPENDENT_AMBULATORY_CARE_PROVIDER_SITE_OTHER): Payer: Federal, State, Local not specified - PPO

## 2023-05-23 DIAGNOSIS — I5020 Unspecified systolic (congestive) heart failure: Secondary | ICD-10-CM

## 2023-05-24 LAB — CUP PACEART REMOTE DEVICE CHECK
Battery Remaining Longevity: 105 mo
Battery Voltage: 3.01 V
Brady Statistic RV Percent Paced: 0.01 %
Date Time Interrogation Session: 20241022033422
HighPow Impedance: 53 Ohm
Implantable Lead Connection Status: 753985
Implantable Lead Implant Date: 20210127
Implantable Lead Location: 753860
Implantable Pulse Generator Implant Date: 20210127
Lead Channel Impedance Value: 304 Ohm
Lead Channel Impedance Value: 380 Ohm
Lead Channel Pacing Threshold Amplitude: 0.625 V
Lead Channel Pacing Threshold Pulse Width: 0.4 ms
Lead Channel Sensing Intrinsic Amplitude: 7.625 mV
Lead Channel Sensing Intrinsic Amplitude: 7.625 mV
Lead Channel Setting Pacing Amplitude: 2 V
Lead Channel Setting Pacing Pulse Width: 0.4 ms
Lead Channel Setting Sensing Sensitivity: 0.3 mV
Zone Setting Status: 755011
Zone Setting Status: 755011

## 2023-05-26 ENCOUNTER — Other Ambulatory Visit (INDEPENDENT_AMBULATORY_CARE_PROVIDER_SITE_OTHER): Payer: Federal, State, Local not specified - PPO

## 2023-05-26 DIAGNOSIS — E871 Hypo-osmolality and hyponatremia: Secondary | ICD-10-CM | POA: Diagnosis not present

## 2023-05-26 DIAGNOSIS — E039 Hypothyroidism, unspecified: Secondary | ICD-10-CM | POA: Diagnosis not present

## 2023-05-26 DIAGNOSIS — M810 Age-related osteoporosis without current pathological fracture: Secondary | ICD-10-CM | POA: Diagnosis not present

## 2023-05-26 LAB — BASIC METABOLIC PANEL
BUN: 18 mg/dL (ref 6–23)
CO2: 28 meq/L (ref 19–32)
Calcium: 9.7 mg/dL (ref 8.4–10.5)
Chloride: 96 meq/L (ref 96–112)
Creatinine, Ser: 1.13 mg/dL (ref 0.40–1.20)
GFR: 48.83 mL/min — ABNORMAL LOW (ref >60.00)
Glucose, Bld: 87 mg/dL (ref 70–99)
Potassium: 4.8 meq/L (ref 3.5–5.1)
Sodium: 132 meq/L — ABNORMAL LOW (ref 135–145)

## 2023-05-26 LAB — TSH: TSH: 3.88 u[IU]/mL (ref 0.35–5.50)

## 2023-05-26 LAB — VITAMIN D 25 HYDROXY (VIT D DEFICIENCY, FRACTURES): VITD: 62.37 ng/mL (ref 30.00–100.00)

## 2023-05-26 LAB — T4, FREE: Free T4: 1.01 ng/dL (ref 0.60–1.60)

## 2023-05-31 ENCOUNTER — Ambulatory Visit: Payer: Federal, State, Local not specified - PPO | Admitting: Endocrinology

## 2023-05-31 ENCOUNTER — Encounter: Payer: Self-pay | Admitting: Endocrinology

## 2023-05-31 ENCOUNTER — Other Ambulatory Visit: Payer: Self-pay | Admitting: Pulmonary Disease

## 2023-05-31 VITALS — BP 120/52 | HR 59 | Resp 20 | Ht 62.9 in | Wt 108.8 lb

## 2023-05-31 DIAGNOSIS — Z9889 Other specified postprocedural states: Secondary | ICD-10-CM | POA: Diagnosis not present

## 2023-05-31 DIAGNOSIS — M810 Age-related osteoporosis without current pathological fracture: Secondary | ICD-10-CM | POA: Diagnosis not present

## 2023-05-31 DIAGNOSIS — E039 Hypothyroidism, unspecified: Secondary | ICD-10-CM | POA: Diagnosis not present

## 2023-05-31 DIAGNOSIS — J432 Centrilobular emphysema: Secondary | ICD-10-CM

## 2023-05-31 DIAGNOSIS — E871 Hypo-osmolality and hyponatremia: Secondary | ICD-10-CM

## 2023-05-31 DIAGNOSIS — Z8639 Personal history of other endocrine, nutritional and metabolic disease: Secondary | ICD-10-CM | POA: Insufficient documentation

## 2023-05-31 NOTE — Progress Notes (Signed)
Outpatient Endocrinology Note [place] Chaden Doom, MD  05/31/23  Patient's Name: Pam [name]    DOB: 1950-10-22    MRN: [id number]  REASON OF VISIT: Follow up of osteoporosis  PCP: Cheron Schaumann., MD  HISTORY OF PRESENT ILLNESS:   Pam [name] is a 72 y.o. old female with past medical history listed below, is here for follow up of bone health issues / osteoporosis /hypothyroidism/hyponatremia.  Patient was last seen by Dr. Lucianne Muss in July 2024.  Pertinent  History:  # Osteoporosis Screening bone density showed osteoporosis at the hip and radius as of 07/2019. Osteoporosis probably related to primary hyperparathyroidism.  Patient has history of primary hyperparathyroidism status post right superior parathyroidectomy in July 2022.   She has been on alendronate 70 mg weekly since 12 / 2020.     Bone density results show improvement as of 03/2022:    Lumbar spine L1-L2 (L3,L 4) Femoral neck (FN) 33% distal radius Ultra distal radius    T-score -0.8 RFN: -2.6 LFN: -2.2 -2.1 -2.9  Change in BMD from previous DXA test (%) +4.0% +3.8% -6.2% N/a    07/2019 Lumbar spine L1-L3 (L4) Femoral neck (FN) 33% distal radius Ultra distal radius  T-score -1.0 RFN: -2.8 LFN: -2.4  -1.6  -2.7    Patient has been taking calcium 1 tablet daily and vitamin D3 2000 units daily.   # Primary hypothyroidism -Patient has mild hypothyroidism asymptomatic at the time of diagnosis.  Initially levothyroxine was restarted 37.5 mcg daily by primary care provider.  # Hyponatremia She has had long-term hyponatremia of unclear etiology, not on thiazide diuretics except Lasix Also not on any other drugs causing hyponatremia. Previously urine osmolality was relatively high at 332 and urine sodium last 39. Has been advised to be restricting her fluids and cut back drinking fluids including water, coffee, juices and occasional diet drinks.  Serum sodium has been in the low 130s range.   Interval history  05/31/23 Patient recently had ankle sprain but no fracture.  She complains of right hand fingers joint pain and stiffness, advised to talk with primary care provider for possible arthritis.  She has been taking alendronate weekly, proper intake reviewed.  She has been taking levothyroxine 37.5 mcg daily.  She reports compliance.  No hypo and hyperthyroid symptoms.  Recent lab results reviewed with normal thyroid function test, normal vitamin D level, stable serum sodium of 132.  No other complaints today.  She has been taking calcium 1 tablet daily, does not recall the dose and has been taking vitamin D3 2000 international units daily.  Labs:   Latest Reference Range & Units 05/26/23 09:36  TSH 0.35 - 5.50 uIU/mL 3.88  T4,Free(Direct) 0.60 - 1.60 ng/dL 7.82    Latest Reference Range & Units 05/26/23 09:36  Sodium 135 - 145 mEq/L 132 (L)  Potassium 3.5 - 5.1 mEq/L 4.8  Chloride 96 - 112 mEq/L 96  CO2 19 - 32 mEq/L 28  Glucose 70 - 99 mg/dL 87  BUN 6 - 23 mg/dL 18  Creatinine 9.56 - 2.13 mg/dL 0.86  Calcium 8.4 - 57.8 mg/dL 9.7  GFR >46.96 mL/min 48.83 (L)  (L): Data is abnormally low  Latest Reference Range & Units 05/26/23 09:36  VITD 30.00 - 100.00 ng/mL 62.37    REVIEW OF SYSTEMS:  As per history of present illness.   PAST MEDICAL HISTORY: Past Medical History:  Diagnosis Date   Abnormal EKG    AICD (automatic cardioverter/defibrillator) present  Alcoholic intoxication without complication (HCC)    Allergy    Cardiomyopathy, ischemic    Cataract    Chest pain 08/29/2016   Chest pain 08/29/2016   CHF (congestive heart failure) (HCC)    Chronic systolic heart failure (HCC) 08/29/2016   Facial laceration    Fall 07/07/2016   Hyperlipidemia    Hypertension 03/03/2017   Hyponatremia    Hypothyroidism 07/08/2016   NSTEMI (non-ST elevated myocardial infarction) (HCC)    Syncope    Thyroid disease     PAST SURGICAL HISTORY: Past Surgical History:  Procedure  Laterality Date   CARDIAC CATHETERIZATION N/A 07/08/2016   Procedure: Left Heart Cath and Coronary Angiography;  Surgeon: Yvonne Kendall, MD;  Location: [facility] INVASIVE CV LAB;  Service: Cardiovascular;  Laterality: N/A;   CARDIAC CATHETERIZATION N/A 08/29/2016   Procedure: Coronary Atherectomy;  Surgeon: Yvonne Kendall, MD;  Location: [facility] INVASIVE CV LAB;  Service: Cardiovascular;  Laterality: N/A;   CARDIAC CATHETERIZATION N/A 08/29/2016   Procedure: Coronary Stent Intervention;  Surgeon: Yvonne Kendall, MD;  Location: [facility] INVASIVE CV LAB;  Service: Cardiovascular;  Laterality: N/A;   CATARACT EXTRACTION, BILATERAL     COLONOSCOPY WITH PROPOFOL N/A 11/18/2021   Procedure: COLONOSCOPY WITH PROPOFOL;  Surgeon: Tressia Danas, MD;  Location: [facility] ENDOSCOPY;  Service: Gastroenterology;  Laterality: N/A;   CORONARY ANGIOPLASTY     coronary stents      ICD IMPLANT N/A 08/28/2019   Procedure: ICD IMPLANT;  Surgeon: Regan Lemming, MD;  Location: [facility] INVASIVE CV LAB;  Service: Cardiovascular;  Laterality: N/A;   LEFT HEART CATH AND CORONARY ANGIOGRAPHY N/A 04/29/2022   Procedure: LEFT HEART CATH AND CORONARY ANGIOGRAPHY;  Surgeon: Marykay Lex, MD;  Location: [facility] INVASIVE CV LAB;  Service: Cardiovascular;  Laterality: N/A;   PARATHYROIDECTOMY Right 02/02/2021   Procedure: RIGHT SUPERIOR PARATHYROIDECTOMY;  Surgeon: Darnell Level, MD;  Location: [facility] ORS;  Service: General;  Laterality: Right;   POLYPECTOMY  11/18/2021   Procedure: POLYPECTOMY;  Surgeon: Tressia Danas, MD;  Location: [facility] ENDOSCOPY;  Service: Gastroenterology;;   RIGHT HEART CATH N/A 08/11/2022   Procedure: RIGHT HEART CATH;  Surgeon: Dorthula Nettles, DO;  Location: [facility] INVASIVE CV LAB;  Service: Cardiovascular;  Laterality: N/A;   VAGINAL HYSTERECTOMY      ALLERGIES: Allergies  Allergen Reactions   Lisinopril Cough    Dry cough    FAMILY HISTORY:  Family History  Problem Relation Age of Onset   Kidney failure Mother     Heart disease Mother        CHF   Heart disease Sister    Colon cancer Neg Hx    Esophageal cancer Neg Hx    Rectal cancer Neg Hx    Stomach cancer Neg Hx     SOCIAL HISTORY: Social History   Socioeconomic History   Marital status: Married    Spouse name: TEFL teacher   Number of children: 2   Years of education: Not on file   Highest education level: High school graduate  Occupational History   Occupation: Retired-worked for [employer]  Tobacco Use   Smoking status: Former    Current packs/day: 0.00    Average packs/day: 1 pack/day for 50.0 years (50.0 ttl pk-yrs)    Types: Cigarettes    Start date: 07/07/1966    Quit date: 07/07/2016    Years since quitting: 6.9   Smokeless tobacco: Never  Vaping Use   Vaping status: Never Used  Substance and Sexual Activity   Alcohol  use: Not Currently   Drug use: Never   Sexual activity: Not Currently  Other Topics Concern   Not on file  Social History Narrative   Not on file   Social Determinants of Health   Financial Resource Strain: Low Risk  (04/29/2022)   Overall Financial Resource Strain (CARDIA)    Difficulty of Paying Living Expenses: Not hard at all  Food Insecurity: Low Risk  (01/02/2023)   [facility]   Hunger Vital Sign    Worried About Running Out of Food in the Last Year: Never true    Ran Out of Food in the Last Year: Never true  Transportation Needs: No Transportation Needs (01/02/2023)   [facility]   Transportation    In the past 12 months, has lack of reliable transportation kept you from medical appointments, meetings, work or from getting things needed for daily living? : No  Physical Activity: Not on file  Stress: Not on file  Social Connections: Not on file    MEDICATIONS:  Current Outpatient Medications  Medication Sig Dispense Refill   acetaminophen (TYLENOL) 500 MG tablet Take 1,000 mg by mouth every 6 (six) hours as needed for moderate  pain.     albuterol (VENTOLIN HFA) 108 (90 Base) MCG/ACT inhaler Inhale 2 puffs into the lungs every 6 (six) hours as needed for wheezing or shortness of breath. 8 g 6   alendronate (FOSAMAX) 70 MG tablet TAKE 1 TABLET(70 MG) BY MOUTH 1 TIME A WEEK WITH A FULL GLASS OF WATER AND ON AN EMPTY STOMACH 12 tablet 1   ANORO ELLIPTA 62.5-25 MCG/ACT AEPB INHALE 1 PUFF INTO THE LUNGS DAILY 60 each 5   atorvastatin (LIPITOR) 80 MG tablet TAKE 1 TABLET(80 MG) BY MOUTH DAILY 90 tablet 1   bisoprolol (ZEBETA) 5 MG tablet TAKE 1 TABLET(5 MG) BY MOUTH DAILY 30 tablet 11   Cholecalciferol (VITAMIN D3) 50 MCG (2000 UT) TABS Take 2,000 mcg by mouth daily.     clopidogrel (PLAVIX) 75 MG tablet TAKE 1 TABLET(75 MG) BY MOUTH DAILY WITH BREAKFAST 90 tablet 2   digoxin (LANOXIN) 0.125 MG tablet Take 1 tablet (0.125 mg total) by mouth daily. 90 tablet 3   furosemide (LASIX) 20 MG tablet Take 1 tablet (20 mg total) by mouth as needed. 30 tablet 10   JARDIANCE 10 MG TABS tablet TAKE 1 TABLET(10 MG) BY MOUTH DAILY 30 tablet 11   levothyroxine (SYNTHROID) 25 MCG tablet Take 37.5 mcg by mouth daily before breakfast.     nitroGLYCERIN (NITROSTAT) 0.4 MG SL tablet Place 1 tablet (0.4 mg total) under the tongue every 5 (five) minutes x 3 doses as needed for chest pain. 75 tablet 3   nystatin cream (MYCOSTATIN) Apply 1 Application topically 2 (two) times daily as needed (rash).     ranolazine (RANEXA) 1000 MG SR tablet Take 1 tablet (1,000 mg total) by mouth 2 (two) times daily. 180 tablet 3   sacubitril-valsartan (ENTRESTO) 24-26 MG Take 1 tablet by mouth 2 (two) times daily. 60 tablet 11   spironolactone (ALDACTONE) 25 MG tablet Take 0.5 tablets (12.5 mg total) by mouth daily.     valACYclovir (VALTREX) 1000 MG tablet Take 1,000 mg by mouth daily.     No current facility-administered medications for this visit.    PHYSICAL EXAM: Vitals:   05/31/23 0956  BP: (!) 120/52  Pulse: (!) 59  Resp: 20  SpO2: 99%  Weight: 108  lb 12.8 oz (49.4 kg)  Height: 5' 2.9" (1.598 m)   Body mass index is 19.33 kg/m.   General: Well developed, well nourished female in no apparent distress.  HEENT: AT/[place], no external lesions. Hearing intact to the spoken word Eyes: Conjunctiva clear and no icterus. Neck: Trachea midline, neck supple , anterior neck scar present status post parathyroidectomy. Lungs: Clear to auscultation, no wheeze. Respirations not labored Neurologic: Alert, oriented, normal speech, deep tendon biceps reflexes normal,  no gross focal neurological deficit Extremities: No pedal pitting edema, no tremors of outstretched hands. No spine tenderness Skin: Warm, color good.  Psychiatric: Does not appear depressed or anxious  PERTINENT HISTORIC LABORATORY AND IMAGING STUDIES:  All pertinent laboratory results were reviewed. Please see HPI also for further details.   Lab Results  Component Value Date   ALKPHOS 39 05/31/2022   ALKPHOS 129 (H) 04/26/2022   ALKPHOS 52 09/21/2020    ASSESSMENT / PLAN  1. Osteoporosis without current pathological fracture, unspecified osteoporosis type   2. Hypothyroidism, adult   3. Hyponatremia   4. History of parathyroid surgery   5. History of primary hyperparathyroidism   6. Age-related osteoporosis without current pathological fracture    # Osteoporosis -Patient was started on alendronate 70 mg weekly in December 2020. -Continue alendronate/Fosamax 70 mg weekly.  Will likely treat with Fosamax for total of 5 years and consider drug holiday. -Last DEXA scan was in August 2023.  Will repeat DEXA scan in fall 2025. -Recent vitamin D level normal.  Continue current vitamin D supplement and calcium supplement. -Discussed about fall precautions.  Discussed about weightbearing exercise as tolerated.  # Patient has history of primary hyperparathyroidism status post right superior parathyroidectomy in July 2022.  # Hypothyroidism -Likely subclinical, recent thyroid lab  normal.  Continue current dose of levothyroxine 37.5 mcg daily.  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 -Advised for limiting excessive intake of fluids for example water and juice. -Patient reports she had salt tablet in the past however not taking lately due to heart problem. -Recent serum sodium 132 has remained stable in low 130s.   Diagnoses and all orders for this visit:  Osteoporosis without current pathological fracture, unspecified osteoporosis type  Hypothyroidism, adult -     T4, free; Future -     TSH; Future  Hyponatremia -     Basic metabolic panel; Future  History of parathyroid surgery  History of primary hyperparathyroidism  Age-related osteoporosis without current pathological fracture    DISPOSITION Follow up in clinic in 6 months suggested.  Labs few days prior to follow-up visit.  All questions answered and patient verbalized understanding of the plan.  [place] Williams Dietrick, MD [facility] Endocrinology [facility] [address] [place], [address] [place], [place] [address] Phone # [phone]  At least part of this note was generated using voice recognition software. Inadvertent word errors may have occurred, which were not recognized during the proofreading process.

## 2023-06-06 ENCOUNTER — Telehealth (HOSPITAL_COMMUNITY): Payer: Self-pay | Admitting: Pharmacy Technician

## 2023-06-06 ENCOUNTER — Other Ambulatory Visit (HOSPITAL_COMMUNITY): Payer: Self-pay

## 2023-06-06 NOTE — Telephone Encounter (Signed)
Patient Advocate Encounter   Received notification from [facility] that prior authorization for [place] [employer] is required.   PA submitted on CoverMyMeds Key [id number] Status is pending   Will continue to follow.

## 2023-06-07 ENCOUNTER — Other Ambulatory Visit (HOSPITAL_COMMUNITY): Payer: Self-pay

## 2023-06-07 NOTE — Telephone Encounter (Signed)
Advanced Heart Failure Patient Advocate Encounter  Prior Authorization for [place] [employer] has been approved.    PA#  [id number] Effective dates: 05/07/23 through 06/05/24  Patients co-pay is $35  Archer Asa, CPhT

## 2023-06-09 NOTE — Progress Notes (Signed)
Remote ICD transmission.   

## 2023-06-18 ENCOUNTER — Other Ambulatory Visit (HOSPITAL_COMMUNITY): Payer: Self-pay | Admitting: Cardiology

## 2023-06-30 ENCOUNTER — Other Ambulatory Visit (HOSPITAL_COMMUNITY): Payer: Self-pay | Admitting: Cardiology

## 2023-07-10 ENCOUNTER — Other Ambulatory Visit: Payer: Self-pay | Admitting: Pulmonary Disease

## 2023-07-10 ENCOUNTER — Other Ambulatory Visit: Payer: Self-pay

## 2023-07-10 ENCOUNTER — Telehealth: Payer: Self-pay | Admitting: Endocrinology

## 2023-07-10 DIAGNOSIS — J432 Centrilobular emphysema: Secondary | ICD-10-CM

## 2023-07-10 DIAGNOSIS — E039 Hypothyroidism, unspecified: Secondary | ICD-10-CM

## 2023-07-10 DIAGNOSIS — M810 Age-related osteoporosis without current pathological fracture: Secondary | ICD-10-CM

## 2023-07-10 MED ORDER — LEVOTHYROXINE SODIUM 25 MCG PO TABS: ORAL_TABLET | ORAL | 1 refills | Status: DC

## 2023-07-10 MED ORDER — ALENDRONATE SODIUM 70 MG PO TABS
ORAL_TABLET | ORAL | 1 refills | Status: DC
Start: 2023-07-10 — End: 2023-12-21

## 2023-07-10 NOTE — Telephone Encounter (Signed)
Levothyroxine refill request complete, patient made aware via mychart/phone.

## 2023-07-10 NOTE — Telephone Encounter (Signed)
Patient needs several medication refills please call [facility] in [facility], call patient when done

## 2023-07-11 NOTE — Progress Notes (Signed)
ADVANCED HEART FAILURE CLINIC NOTE  Referring Physician: Cheron Schaumann.,*  Primary Care: Cheron Schaumann., MD Primary Cardiologist: Dr. Clifton James, Dr. Eden Emms  HPI: Pam [name] is a 72 y.o. female with chronic systolic heart failure secondary to ischemic cardiomyopathy status post ICD, coronary artery disease with CTO of the mid LAD filled with left to left and right to left collaterals presenting today to establish care.  Her cardiac history dates back to December 2017 when she presented with an NSTEMI.  At that time echocardiogram demonstrated LVEF of 35 to 40% with preserved RV function.  Coronary angiography with heavily calcified CTO of the proximal to mid LAD filled by collaterals and calcified 80% stenosis of the circumflex/OM1.  Follow-up cardiac MRI demonstrated nonviable LAD territory.  She ultimately underwent atherectomy and IVUS guided PCI of the proximal circumflex/OM1.  In November 2020 she had a reduction in her LVEF to 25 to 30% and underwent Medtronic ICD placement in early 2021.  According to [name] she did fairly well for a few months however over the past several months has once again had some progression of dyspnea and a decrease in exercise capacity.  She was admitted in September 2023 with acute on chronic CHF exacerbation.  TTE and left heart cath from admission were stable compared to prior.  She was continued on medical therapy for CAD and started on low-dose GDMT for systolic heart failure.  Since that time she has been seen in [facility] clinic where GDMT was further uptitrated.  She also underwent PFTs that were significant for reversible obstructive lung disease and emphysema.  We previously met and discussed possibility of LVAD. At this time Pam [name] and her husband have decided against LVAD therapy. From a functional standpoint, she still goes grocery shopping, performs all ADLs independently; however, has to move very slowly. Her Bps remain in the low 90s with  very little lightheadedness. She is just finishing a course of antibiotics for an upper respiratory infection. Her husband reports that she is often 'staggering'.   Interval hx:  Continued to have a steady decline in functional status; she continues to go walking; has an ankle sprain. She is now taking Entresto 24/26mg  BID rather than 12/13mg  BID; no lightheadness.   Activity level/exercise tolerance: NYHA IIIB Orthopnea:  Sleeps on 2 pillows Paroxysmal noctural dyspnea: Yes but infrequent Chest pain/pressure: Stable at this time Orthostatic lightheadedness: No Palpitations: No Lower extremity edema: No Presyncope/syncope: No Cough: No  Past Medical History:  Diagnosis Date   Abnormal EKG    AICD (automatic cardioverter/defibrillator) present    Alcoholic intoxication without complication (HCC)    Allergy    Cardiomyopathy, ischemic    Cataract    Chest pain 08/29/2016   Chest pain 08/29/2016   CHF (congestive heart failure) (HCC)    Chronic systolic heart failure (HCC) 08/29/2016   Facial laceration    Fall 07/07/2016   Hyperlipidemia    Hypertension 03/03/2017   Hyponatremia    Hypothyroidism 07/08/2016   NSTEMI (non-ST elevated myocardial infarction) (HCC)    Syncope    Thyroid disease     Current Outpatient Medications  Medication Sig Dispense Refill   acetaminophen (TYLENOL) 500 MG tablet Take 1,000 mg by mouth every 6 (six) hours as needed for moderate pain.     albuterol (VENTOLIN HFA) 108 (90 Base) MCG/ACT inhaler Inhale 2 puffs into the lungs every 6 (six) hours as needed for wheezing or shortness of breath. 8 g 6   alendronate (  FOSAMAX) 70 MG tablet TAKE 1 TABLET(70 MG) BY MOUTH 1 TIME A WEEK WITH A FULL GLASS OF WATER AND ON AN EMPTY STOMACH 12 tablet 1   ANORO ELLIPTA 62.5-25 MCG/ACT AEPB INHALE 1 PUFF INTO THE LUNGS DAILY 60 each 5   atorvastatin (LIPITOR) 80 MG tablet TAKE 1 TABLET(80 MG) BY MOUTH DAILY 90 tablet 1   bisoprolol (ZEBETA) 5 MG tablet TAKE 1  TABLET(5 MG) BY MOUTH DAILY 30 tablet 11   Cholecalciferol (VITAMIN D3) 50 MCG (2000 UT) TABS Take 2,000 mcg by mouth daily.     clopidogrel (PLAVIX) 75 MG tablet TAKE 1 TABLET(75 MG) BY MOUTH DAILY WITH BREAKFAST 90 tablet 2   digoxin (LANOXIN) 0.125 MG tablet Take 1 tablet (0.125 mg total) by mouth daily. 90 tablet 3   furosemide (LASIX) 20 MG tablet Take 1 tablet (20 mg total) by mouth as needed. 30 tablet 10   JARDIANCE 10 MG TABS tablet TAKE 1 TABLET(10 MG) BY MOUTH DAILY 30 tablet 11   levothyroxine (SYNTHROID) 25 MCG tablet Take 37.5 mcg by mouth daily before breakfast. 180 tablet 1   nitroGLYCERIN (NITROSTAT) 0.4 MG SL tablet Place 1 tablet (0.4 mg total) under the tongue every 5 (five) minutes x 3 doses as needed for chest pain. 75 tablet 3   nystatin cream (MYCOSTATIN) Apply 1 Application topically 2 (two) times daily as needed (rash).     ranolazine (RANEXA) 1000 MG SR tablet Take 1 tablet (1,000 mg total) by mouth 2 (two) times daily. 180 tablet 3   sacubitril-valsartan (ENTRESTO) 24-26 MG Take 1 tablet by mouth 2 (two) times daily. 60 tablet 11   spironolactone (ALDACTONE) 25 MG tablet TAKE 1 TABLET(25 MG) BY MOUTH DAILY 90 tablet 3   valACYclovir (VALTREX) 1000 MG tablet Take 1,000 mg by mouth daily.     No current facility-administered medications for this visit.    Allergies  Allergen Reactions   Lisinopril Cough    Dry cough      Social History   Socioeconomic History   Marital status: Married    Spouse name: TEFL teacher   Number of children: 2   Years of education: Not on file   Highest education level: High school graduate  Occupational History   Occupation: Retired-worked for post [employer]  Tobacco Use   Smoking status: Former    Current packs/day: 0.00    Average packs/day: 1 pack/day for 50.0 years (50.0 ttl pk-yrs)    Types: Cigarettes    Start date: 07/07/1966    Quit date: 07/07/2016    Years since quitting: 7.0   Smokeless tobacco: Never  Vaping Use    Vaping status: Never Used  Substance and Sexual Activity   Alcohol use: Not Currently   Drug use: Never   Sexual activity: Not Currently  Other Topics Concern   Not on file  Social History Narrative   Not on file   Social Determinants of Health   Financial Resource Strain: Low Risk  (04/29/2022)   Overall Financial Resource Strain (CARDIA)    Difficulty of Paying Living Expenses: Not hard at all  Food Insecurity: Low Risk  (06/09/2023)   Received from [facility]   Hunger Vital Sign    Worried About Running Out of Food in the Last Year: Never true    Ran Out of Food in the Last Year: Never true  Transportation Needs: No Transportation Needs (06/09/2023)   Received from [facility]    In the  past 12 months, has lack of reliable transportation kept you from medical appointments, meetings, work or from getting things needed for daily living? : No  Physical Activity: Not on file  Stress: Not on file  Social Connections: Not on file  Intimate Partner Violence: Not on file      Family History  Problem Relation Age of Onset   Kidney failure Mother    Heart disease Mother        CHF   Heart disease Sister    Colon cancer Neg Hx    Esophageal cancer Neg Hx    Rectal cancer Neg Hx    Stomach cancer Neg Hx     PHYSICAL EXAM: There were no vitals filed for this visit. GENERAL: Well nourished, well developed, and in no apparent distress at rest.  HEENT: Negative for arcus senilis or xanthelasma. There is no scleral icterus.  The mucous membranes are pink and moist.   NECK: Supple, No masses. Normal carotid upstrokes without bruits. No masses or thyromegaly.    CHEST: There are no chest wall deformities. There is no chest wall tenderness. Respirations are unlabored.  Lungs- *** CARDIAC:  JVP: *** cm          Normal rate with regular rhythm. No murmurs, rubs or gallops.  Pulses are 2+ and symmetrical in upper and lower extremities. *** edema.  ABDOMEN: Soft,  non-tender, non-distended. There are no masses or hepatomegaly. There are normal bowel sounds.  EXTREMITIES: Warm and well perfused with no cyanosis, clubbing.  LYMPHATIC: No axillary or supraclavicular lymphadenopathy.  NEUROLOGIC: Patient is oriented x3 with no focal or lateralizing neurologic deficits.  PSYCH: Patients affect is appropriate, there is no evidence of anxiety or depression.  SKIN: Warm and dry; no lesions or wounds.     DATA REVIEW  ECG: Normal sinus rhythm  ECHO: 04/27/22: LVEF 25%-30%, RV function normal.  06/19/19: LVEF 25%-30%, Normal RV function.   CMR:  12/17: . Normal size and thickness of the left ventricle. Systolic function is moderately decreased (LVEF = 38%).  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 There is akinesis of the apical lateral, mid anterior, anteroseptal walls.  The LAD territory is non-viable, RCA and LCX territory is viable.  2. Normal right ventricular size, thickness and systolic function (RVEF = 54%) with no regional wall motion abnormalities.  3.  Normal biatrial size.  4.  Mild mitral and mild to moderate tricuspid regurgitation.  5. Normal size of the aortic root, ascending aorta and pulmonary artery.  6.  Normal pericardium, trivial pericardial effusion.   CATH: 04/29/22:  Prox LAD to Mid LAD lesion is 90% stenosed with 95% stenosed side branch in 1st Sept. Mid LAD to Dist LAD lesion is 100% stenosed.   Previously placed Prox Cx to Dist Cx stent of unknown type is  widely patent with 0% stenosed side branch in 1st Mrg.   Lat 1st Mrg lesion is 50% stenosed.   LV end diastolic pressure is moderately elevated.   There is no aortic valve stenosis.  08/11/22, RHC: HEMODYNAMICS: RA:                  1 mmHg (mean) RV:                  27/1 mmHg PA:                  31/5 mmHg (14 mean) PCWP:  5 mmHg (mean)                                      Estimated Fick CO/CI   2.87  L/min, 1.89 L/min/m2                                          TPG                 9  mmHg                                              PVR                 3.14 Wood Units  PAPi                >4  ASSESSMENT & PLAN:  NYHA IIB-III, Stage C, Systolic heart failrue Etiology of HF: Secondary to ischemic cardiomyopathy with nonviable myocardium as reported above NYHA class / AHA Stage: NYHA III Volume status & Diuretics: Low filling pressures on RHC; will take lasix PRN only now.  Vasodilators: Increase Entresto to 24/26mg  BID (she has been taking this).  Beta-Blocker: continue bisoprolol to 2.5mg  BID. Continue digoxin daily. Obtain digoxin level today. MRA: Continue spironolactone 25mg  daily Cardiometabolic: Jardiance 10 mg daily Devices therapies & Valvulopathies: Primary prevention ICD in place Advanced therapies: CPX w/ VE/VCO2 slop of 69.4 and PVO2 of 13.2; CPX consistent with moderate to severe cardiac limitation in addition to obstructive lung disease. I had a lengthy discussion with the patient today regarding advanced therapies. Her COPD, age and frailty make her a marginal LVAD candidate, however, she has no absolute contraindications to durable VAD support. RHC last week with cardiac index of 1.89L/min/m2. Today we had a very length discussion with our LVAD coordinator Carlton Adam. We discussed all her prior testing including CPX, RHC, echocardiograms and prognosis of patients with stage D heart failure. We also discussed our LVAD outcomes and national outcomes. After multiple lengthy discussions with Ms. Pineo and her husband, they have decided against LVAD at this time. She is slowly showing signs of progressive LV failure including hypotension, bradycardia; we will likely have to start decreasing GDMT doses moving forward.  12/13/22: I remain concerned about her prognosis over the next year. She continues to lose weight secondary to cardiac cachexia. She is otherwise very  independent with good social support, however, not interested in advanced therapies including LVAD, BATwire, etc. Will repeat labs today. No med changes.   2.  COPD -Now followed by pulmonology; started on inhalers.  - Stable symptoms; no wheezing on exam today. Compliant.   3.  Coronary artery disease -Management as per primary cardiology -Reports no significant chest pain - Continue ranexa for angina.   4. Primary prevention ICD - Interrogation today; no VT/VF - normal optivol  I spent *** minutes caring for this patient today including face to face time, ordering and reviewing labs, reviewing records from ***, seeing the patient, documenting in the record, and arranging follow ups.   Aleya Durnell Advanced Heart Failure Mechanical Circulatory Support

## 2023-07-12 ENCOUNTER — Telehealth: Payer: Self-pay | Admitting: Pulmonary Disease

## 2023-07-12 ENCOUNTER — Encounter (HOSPITAL_COMMUNITY): Payer: Self-pay | Admitting: Cardiology

## 2023-07-12 ENCOUNTER — Ambulatory Visit (HOSPITAL_COMMUNITY)
Admission: RE | Admit: 2023-07-12 | Discharge: 2023-07-12 | Disposition: A | Discharge status: Home / Self Care | Payer: Federal, State, Local not specified - PPO | Source: Ambulatory Visit | Attending: Cardiology | Admitting: Cardiology

## 2023-07-12 VITALS — BP 120/60 | HR 55 | Wt 108.6 lb

## 2023-07-12 DIAGNOSIS — R0609 Other forms of dyspnea: Secondary | ICD-10-CM | POA: Insufficient documentation

## 2023-07-12 DIAGNOSIS — I255 Ischemic cardiomyopathy: Secondary | ICD-10-CM | POA: Insufficient documentation

## 2023-07-12 DIAGNOSIS — Z5181 Encounter for therapeutic drug level monitoring: Secondary | ICD-10-CM

## 2023-07-12 DIAGNOSIS — I5022 Chronic systolic (congestive) heart failure: Secondary | ICD-10-CM | POA: Insufficient documentation

## 2023-07-12 DIAGNOSIS — Z7902 Long term (current) use of antithrombotics/antiplatelets: Secondary | ICD-10-CM | POA: Diagnosis not present

## 2023-07-12 DIAGNOSIS — Z95810 Presence of automatic (implantable) cardiac defibrillator: Secondary | ICD-10-CM | POA: Insufficient documentation

## 2023-07-12 DIAGNOSIS — J449 Chronic obstructive pulmonary disease, unspecified: Secondary | ICD-10-CM | POA: Diagnosis not present

## 2023-07-12 DIAGNOSIS — Z79899 Other long term (current) drug therapy: Secondary | ICD-10-CM

## 2023-07-12 DIAGNOSIS — I2510 Atherosclerotic heart disease of native coronary artery without angina pectoris: Secondary | ICD-10-CM | POA: Diagnosis not present

## 2023-07-12 DIAGNOSIS — I252 Old myocardial infarction: Secondary | ICD-10-CM | POA: Diagnosis not present

## 2023-07-12 DIAGNOSIS — J432 Centrilobular emphysema: Secondary | ICD-10-CM

## 2023-07-12 LAB — BASIC METABOLIC PANEL
Anion gap: 9 (ref 5–15)
BUN: 16 mg/dL (ref 8–23)
CO2: 25 mmol/L (ref 22–32)
Calcium: 9.8 mg/dL (ref 8.9–10.3)
Chloride: 99 mmol/L (ref 98–111)
Creatinine, Ser: 1.11 mg/dL — ABNORMAL HIGH (ref 0.44–1.00)
GFR, Estimated: 53 mL/min — ABNORMAL LOW (ref >60)
Glucose, Bld: 96 mg/dL (ref 70–99)
Potassium: 4.4 mmol/L (ref 3.5–5.1)
Sodium: 133 mmol/L — ABNORMAL LOW (ref 135–145)

## 2023-07-12 LAB — BRAIN NATRIURETIC PEPTIDE: B Natriuretic Peptide: 630.5 pg/mL — ABNORMAL HIGH (ref 0.0–100.0)

## 2023-07-12 LAB — DIGOXIN LEVEL: Digoxin Level: 1.4 ng/mL (ref 0.8–2.0)

## 2023-07-12 MED ORDER — BISOPROLOL FUMARATE 5 MG PO TABS: 2.5000 mg | ORAL_TABLET | Freq: Every day | ORAL | Status: DC

## 2023-07-12 MED ORDER — ALBUTEROL SULFATE HFA 108 (90 BASE) MCG/ACT IN AERS: 2.0000 | INHALATION_SPRAY | Freq: Four times a day (QID) | RESPIRATORY_TRACT | 6 refills | Status: DC | PRN

## 2023-07-12 NOTE — Telephone Encounter (Signed)
Ventolin has been sent to preferred pharmacy. ?Patient is aware and voiced her understanding.  ?Nothing further needed.  ?

## 2023-07-12 NOTE — Patient Instructions (Signed)
DECREASE Bisoprolol to 2.5 mg ( 1/2 Tab) daily.  Labs done today, your results will be available in MyChart, we will contact you for abnormal readings.  Your physician recommends that you schedule a follow-up appointment in: 2 months.  If you have any questions or concerns before your next appointment please send [place] a message through [place] or call our office at [phone].    TO LEAVE A MESSAGE FOR THE NURSE SELECT OPTION 2, PLEASE LEAVE A MESSAGE INCLUDING: YOUR NAME DATE OF BIRTH CALL BACK NUMBER REASON FOR CALL**this is important as we prioritize the call backs  YOU WILL RECEIVE A CALL BACK THE SAME DAY AS LONG AS YOU CALL BEFORE 4:00 PM At the [facility], you and your health needs are our priority. As part of our continuing mission to provide you with exceptional heart care, we have created designated Provider Care Teams. These Care Teams include your primary Cardiologist (physician) and Advanced Practice Providers (APPs- Physician Assistants and Nurse Practitioners) who all work together to provide you with the care you need, when you need it.   You may see any of the following providers on your designated Care Team at your next follow up: Dr Arvilla Meres Dr Marca Ancona Dr. Dorthula Nettles Dr. Clearnce Hasten Amy Filbert Schilder, NP Robbie Lis, [place] [facility] [place], [place] Brynda Peon, NP [place] Lee, NP Karle Plumber, PharmD   Please be sure to bring in all your medications bottles to every appointment.    Thank you for choosing [facility]

## 2023-07-12 NOTE — Telephone Encounter (Signed)
PT calling about her Albuterol order thru the Pharm. Please fill and send by tomorrow. She is on last puff.  Pharm is [facility] in [place]

## 2023-07-14 ENCOUNTER — Telehealth (HOSPITAL_COMMUNITY): Payer: Self-pay

## 2023-07-14 MED ORDER — DIGOXIN 62.5 MCG PO TABS: 0.0625 mg | ORAL_TABLET | Freq: Every day | ORAL | Status: DC

## 2023-07-14 NOTE — Telephone Encounter (Signed)
-----   Message from [facility] sent at 07/12/2023  5:12 PM EST ----- Her digoxin level is elevated to 1.4. Can we please ask her to hold digoxin for the next 3-4 days and ensure she is taking the 62. dose.

## 2023-07-14 NOTE — Telephone Encounter (Signed)
Spoke with patient regarding the following results. Patient made aware and patient verbalized understanding.   Patient has been taking 0.125mg  digoxin- advised her to decrease to 62.5 per Dr. Gasper Lloyd   Patient aware and verbalized understanding- will hold for 3-4 days and start decreased dose.   Advised patient to call back to office with any issues, questions, or concerns. Patient verbalized understanding.   Medication list updated.- per patient she does not need new dosage sent in.

## 2023-08-22 ENCOUNTER — Ambulatory Visit (INDEPENDENT_AMBULATORY_CARE_PROVIDER_SITE_OTHER): Payer: Federal, State, Local not specified - PPO

## 2023-08-22 DIAGNOSIS — I255 Ischemic cardiomyopathy: Secondary | ICD-10-CM | POA: Diagnosis not present

## 2023-08-22 DIAGNOSIS — I5022 Chronic systolic (congestive) heart failure: Secondary | ICD-10-CM

## 2023-08-22 LAB — CUP PACEART REMOTE DEVICE CHECK
Battery Remaining Longevity: 101 mo
Battery Voltage: 3.01 V
Brady Statistic RV Percent Paced: 0.01 %
Date Time Interrogation Session: 20250121043622
HighPow Impedance: 56 Ohm
Implantable Lead Connection Status: 753985
Implantable Lead Implant Date: 20210127
Implantable Lead Location: 753860
Implantable Pulse Generator Implant Date: 20210127
Lead Channel Impedance Value: 304 Ohm
Lead Channel Impedance Value: 380 Ohm
Lead Channel Pacing Threshold Amplitude: 0.625 V
Lead Channel Pacing Threshold Pulse Width: 0.4 ms
Lead Channel Sensing Intrinsic Amplitude: 7.125 mV
Lead Channel Sensing Intrinsic Amplitude: 7.125 mV
Lead Channel Setting Pacing Amplitude: 2 V
Lead Channel Setting Pacing Pulse Width: 0.4 ms
Lead Channel Setting Sensing Sensitivity: 0.3 mV
Zone Setting Status: 755011
Zone Setting Status: 755011

## 2023-09-15 ENCOUNTER — Telehealth (HOSPITAL_COMMUNITY): Payer: Self-pay | Admitting: Cardiology

## 2023-09-15 NOTE — Telephone Encounter (Signed)
Called patient at [phone] to remind patient of her appointment on Monday 09/18/23 at 9:20 AM with Dr. Gasper Lloyd.   Spoke to patient and confirmed appointment with patient over the telephone on 09/15/23.

## 2023-09-17 NOTE — Progress Notes (Signed)
 AD[facility]ANCED HE[facility] FAILURE CLINIC NOTE  Referring Physician: Cheron [name].,*  Primary Care: Pam [name]., MD Primary Cardiologist: Dr. Clifton [name], Dr. Eden [name]  HPI: Pam [name] is a 73 y.o. female with chronic systolic heart failure secondary to ischemic cardiomyopathy status post ICD, coronary artery disease with CTO of the mid LAD filled with left to left and right to left collaterals presenting today to establish care.  Her cardiac history dates back to December 2017 when she presented with an NSTEMI.  At that time echocardiogram demonstrated LVEF of 35 to 40% with preserved RV function.  Coronary angiography with heavily calcified CTO of the proximal to mid LAD filled by collaterals and calcified 80% stenosis of the circumflex/OM1.  Follow-up cardiac MRI demonstrated nonviable LAD territory.  She ultimately underwent atherectomy and IVUS guided PCI of the proximal circumflex/OM1.  In November 2020 she had a reduction in her LVEF to 25 to 30% and underwent Med[employer] ICD placement in early 2021.  According to [name] she did fairly well for a few months however over the past several months has once again had some progression of dyspnea and a decrease in exercise capacity.  She was admitted in September 2023 with acute on chronic CHF exacerbation.  TTE and left heart cath from admission were stable compared to prior.  She was continued on medical therapy for CAD and started on low-dose GDMT for systolic heart failure.  Since that time she has been seen in [name] where GDMT was further uptitrated.  She also underwent PFTs that were significant for reversible obstructive lung disease and emphysema.  We previously met and discussed possibility of LVAD. At this time [name] and her husband have decided against LVAD therapy. From a functional standpoint, she still goes grocery shopping, performs all ADLs independently; however, has to move very slowly. Her Bps remain in the low 90s with  very little lightheadedness. She is just finishing a course of antibiotics for an upper respiratory infection. Her husband reports that she is often 'staggering'.   Interval hx:  - Stable NYHA III symptoms; spends most of her time at home doing chores around the house. Reports that she becomes easily fatigued with minimal exertion. Device interrogation with 1.3hrs of activity daily.  - No recent HF hospitalizations.  Activity level/exercise tolerance: NYHA IIIB Orthopnea:  Sleeps on 2 pillows Paroxysmal noctural dyspnea: yes, 2-3x weekly Chest pain/pressure: Stable at this time Orthostatic lightheadedness: No Palpitations: No Lower extremity edema: No Presyncope/syncope: No Cough: No  Past Medical History:  Diagnosis Date   Abnormal EKG    AICD (automatic cardioverter/defibrillator) present    Alcoholic intoxication without complication (HCC)    Allergy    Cardiomyopathy, ischemic    Cataract    Chest pain 08/29/2016   Chest pain 08/29/2016   CHF (congestive heart failure) (HCC)    Chronic systolic heart failure (HCC) 08/29/2016   Facial laceration    Fall 07/07/2016   Hyperlipidemia    Hypertension 03/03/2017   Hyponatremia    Hypothyroidism 07/08/2016   NSTEMI (non-ST elevated myocardial infarction) (HCC)    Syncope    Thyroid disease     Current Outpatient Medications  Medication Sig Dispense Refill   acetaminophen (TYLENOL) 500 MG tablet Take 1,000 mg by mouth every 6 (six) hours as needed for moderate pain.     albuterol (VENTOLIN HFA) 108 (90 Base) MCG/ACT inhaler INHALE 2 PUFFS INTO THE LUNGS EVERY 6 HOURS AS NEEDED FOR WHEEZING OR SHORTNESS OF BREATH  6.7 g 0   albuterol (VENTOLIN HFA) 108 (90 Base) MCG/ACT inhaler Inhale 2 puffs into the lungs every 6 (six) hours as needed for wheezing or shortness of breath. 8 g 6   alendronate (FOSAMAX) 70 MG tablet TAKE 1 TABLET(70 MG) BY MOUTH 1 TIME A WEEK WITH A FULL GLASS OF WATER AND ON AN EMPTY STOMACH 12 tablet 1   ANORO  ELLIPTA 62.5-25 MCG/ACT AEPB INHALE 1 PUFF INTO THE LUNGS DAILY 60 each 5   atorvastatin (LIPITOR) 80 MG tablet TAKE 1 TABLET(80 MG) BY MOUTH DAILY 90 tablet 1   bisoprolol (ZEBETA) 5 MG tablet Take 0.5 tablets (2.5 mg total) by mouth daily.     Cholecalciferol (VITAMIN D3) 50 MCG (2000 UT) TABS Take 2,000 mcg by mouth daily.     clopidogrel (PLAVIX) 75 MG tablet TAKE 1 TABLET(75 MG) BY MOUTH DAILY WITH BREAKFAST 90 tablet 2   digoxin 62.5 MCG TABS Take 0.0625 mg by mouth daily.     furosemide (LASIX) 20 MG tablet Take 1 tablet (20 mg total) by mouth as needed. 30 tablet 10   JARDIANCE 10 MG TABS tablet TAKE 1 TABLET(10 MG) BY MOUTH DAILY 30 tablet 11   levothyroxine (SYNTHROID) 25 MCG tablet Take 37.5 mcg by mouth daily before breakfast. 180 tablet 1   nitroGLYCERIN (NITROSTAT) 0.4 MG SL tablet Place 1 tablet (0.4 mg total) under the tongue every 5 (five) minutes x 3 doses as needed for chest pain. 75 tablet 3   nystatin cream (MYCOSTATIN) Apply 1 Application topically 2 (two) times daily as needed (rash).     ranolazine (RANEXA) 1000 MG SR tablet Take 1 tablet (1,000 mg total) by mouth 2 (two) times daily. 180 tablet 3   sacubitril-valsartan (ENTRESTO) 24-26 MG Take 1 tablet by mouth 2 (two) times daily. 60 tablet 11   spironolactone (ALDACTONE) 25 MG tablet TAKE 1 TABLET(25 MG) BY MOUTH DAILY 90 tablet 3   valACYclovir (VALTREX) 1000 MG tablet Take 1,000 mg by mouth daily.     No current facility-administered medications for this visit.    Allergies  Allergen Reactions   Lisinopril Cough    Dry cough      Social History   Socioeconomic History   Marital status: Married    Spouse name: Pam [name]   Number of children: 2   Years of education: Not on file   Highest education level: High school graduate  Occupational History   Occupation: Retired-worked for post [employer]  Tobacco Use   Smoking status: Former    Current packs/day: 0.00    Average packs/day: 1 pack/day for 50.0 years  (50.0 ttl pk-yrs)    Types: Cigarettes    Start date: 07/07/1966    Quit date: 07/07/2016    Years since quitting: 7.2   Smokeless tobacco: Never  Vaping Use   Vaping status: Never Used  Substance and Sexual Activity   Alcohol use: Not Currently   Drug use: Never   Sexual activity: Not Currently  Other Topics Concern   Not on file  Social History Narrative   Not on file   Social Drivers of Health   Financial Resource Strain: Low Risk  (04/29/2022)   Overall Financial Resource Strain (CARDIA)    Difficulty of Paying Living Expenses: Not hard at all  Food Insecurity: Low Risk  (06/09/2023)   Received from [facility]   Hunger Vital Sign    Worried About Running Out of Food in the Last Year: Never true  Ran Out of Food in the Last Year: Never true  Transportation Needs: No Transportation Needs (06/09/2023)   Received from [facility]    In the past 12 months, has lack of reliable transportation kept you from medical appointments, meetings, work or from getting things needed for daily living? : No  Physical Activity: Not on file  Stress: Not on file  Social Connections: Not on file  Intimate Partner Violence: Not on file      Family History  Problem Relation Age of Onset   Kidney failure Mother    Heart disease Mother        CHF   Heart disease Sister    Colon cancer Neg Hx    Esophageal cancer Neg Hx    Rectal cancer Neg Hx    Stomach cancer Neg Hx     PHYSICAL EXAM: There were no vitals filed for this visit. GENERAL: NAD Lungs- *** CARDIAC:  JVP: *** cm          Normal rate with regular rhythm. *** murmur.  Pulses ***. *** edema.  ABDOMEN: Soft, non-tender, non-distended.  EXTREMITIES: Warm and well perfused.  NEUROLOGIC: No obvious FND    DATA REVIEW  ECG: Normal sinus rhythm  ECHO: 04/27/22: LVEF 25%-30%, RV function normal.  06/19/19: LVEF 25%-30%, Normal RV function.   CMR:  12/17: . Normal size and thickness of the left  ventricle. Systolic function is moderately decreased (LVEF = 38%).  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 There is akinesis of the apical lateral, mid anterior, anteroseptal walls.  The LAD territory is non-viable, RCA and LCX territory is viable.  2. Normal right ventricular size, thickness and systolic function (RVEF = 54%) with no regional wall motion abnormalities.  3.  Normal biatrial size.  4.  Mild mitral and mild to moderate tricuspid regurgitation.  5. Normal size of the aortic root, ascending aorta and pulmonary artery.  6.  Normal pericardium, trivial pericardial effusion.   CATH: 04/29/22:  Prox LAD to Mid LAD lesion is 90% stenosed with 95% stenosed side branch in 1st Sept. Mid LAD to Dist LAD lesion is 100% stenosed.   Previously placed Prox Cx to Dist Cx stent of unknown type is  widely patent with 0% stenosed side branch in 1st Mrg.   Lat 1st Mrg lesion is 50% stenosed.   LV end diastolic pressure is moderately elevated.   There is no aortic valve stenosis.  08/11/22, RHC: HEMODYNAMICS: RA:                  1 mmHg (mean) RV:                  27/1 mmHg PA:                  31/5 mmHg (14 mean) PCWP:            5 mmHg (mean)                                      Estimated Fick CO/CI   2.87 L/min, 1.89 L/min/m2  TPG                 9  mmHg                                              PVR                 3.14 Wood Units  PAPi                >4  ASSESSMENT & PLAN:  Stage D, NYHA IIIB, Systolic heart failure Etiology of HF: Secondary to ischemic cardiomyopathy with nonviable myocardium as reported above NYHA class / AHA Stage: NYHA III Volume status & Diuretics: Low filling pressures on RHC; continues to take lasix 20mg  2-3x weekly.  Vasodilators: continue Entresto 24/26mg  BID Beta-Blocker: continue bisoprolol to 2.5mg  daily. Continue digoxin 62. daily; will test levels  today. Check BMP today.  MRA: Continue spironolactone 25mg  daily Cardiometabolic: Jardiance 10 mg daily Devices therapies & Valvulopathies: Primary prevention ICD in place Advanced therapies: CPX w/ VE/VCO2 slop of 69.4 and PVO2 of 13.2; CPX consistent with moderate to severe cardiac limitation in addition to obstructive lung disease. I had a lengthy discussion with the patient today regarding advanced therapies. Her COPD, age and frailty make her a marginal LVAD candidate, however, she has no absolute contraindications to durable VAD support. RHC last week with cardiac index of 1.89L/min/m2. Today we had a very length discussion with our LVAD coordinator Carlton Adam. We discussed all her prior testing including CPX, RHC, echocardiograms and prognosis of patients with stage D heart failure. We also discussed our LVAD outcomes and national outcomes. After multiple lengthy discussions with Ms. Nobel and her husband, they have decided against LVAD at this time. She is slowly showing signs of progressive LV failure including hypotension, bradycardia; we will likely have to start decreasing GDMT doses moving forward.  12/13/22: I remain concerned about her prognosis over the next year. She continues to lose weight secondary to cardiac cachexia. She is otherwise very independent with good social support, however, not interested in advanced therapies including LVAD, BATwire, etc. Will repeat labs today. No med changes.   2.  COPD -Now followed by pulmonology; started on inhalers.  -stable with no wheezing on exam.   3.  Coronary artery disease -Management as per primary cardiology -No significicant chest pain now; continue ranexa.   4. Primary prevention ICD - Device interrogation today with no events; 1.3hrs of activity daily.   I spent 37 minutes caring for this patient today including face to face time, ordering and reviewing labs, continuing discussions on advanced therapies, CCM and barostim, seeing  the patient, documenting in the record, and arranging follow ups.   Shaylin Blatt Advanced Heart Failure Mechanical Circulatory Support

## 2023-09-18 ENCOUNTER — Ambulatory Visit (HOSPITAL_COMMUNITY)
Admission: RE | Admit: 2023-09-18 | Discharge: 2023-09-18 | Disposition: A | Discharge status: Home / Self Care | Payer: Federal, State, Local not specified - PPO | Source: Ambulatory Visit | Attending: Cardiology | Admitting: Cardiology

## 2023-09-18 VITALS — BP 102/70 | HR 56 | Wt 110.1 lb

## 2023-09-18 DIAGNOSIS — I5022 Chronic systolic (congestive) heart failure: Secondary | ICD-10-CM | POA: Diagnosis not present

## 2023-09-18 DIAGNOSIS — Z79899 Other long term (current) drug therapy: Secondary | ICD-10-CM | POA: Diagnosis not present

## 2023-09-18 DIAGNOSIS — I255 Ischemic cardiomyopathy: Secondary | ICD-10-CM | POA: Insufficient documentation

## 2023-09-18 DIAGNOSIS — Z9861 Coronary angioplasty status: Secondary | ICD-10-CM | POA: Insufficient documentation

## 2023-09-18 DIAGNOSIS — I2510 Atherosclerotic heart disease of native coronary artery without angina pectoris: Secondary | ICD-10-CM | POA: Insufficient documentation

## 2023-09-18 DIAGNOSIS — E88A Wasting disease (syndrome) due to underlying condition: Secondary | ICD-10-CM | POA: Insufficient documentation

## 2023-09-18 DIAGNOSIS — Z95810 Presence of automatic (implantable) cardiac defibrillator: Secondary | ICD-10-CM | POA: Diagnosis not present

## 2023-09-18 DIAGNOSIS — I5084 End stage heart failure: Secondary | ICD-10-CM | POA: Diagnosis not present

## 2023-09-18 DIAGNOSIS — Z5181 Encounter for therapeutic drug level monitoring: Secondary | ICD-10-CM | POA: Diagnosis not present

## 2023-09-18 DIAGNOSIS — J449 Chronic obstructive pulmonary disease, unspecified: Secondary | ICD-10-CM

## 2023-09-18 DIAGNOSIS — J439 Emphysema, unspecified: Secondary | ICD-10-CM | POA: Diagnosis not present

## 2023-09-18 LAB — BASIC METABOLIC PANEL
Anion gap: 9 (ref 5–15)
BUN: 14 mg/dL (ref 8–23)
CO2: 24 mmol/L (ref 22–32)
Calcium: 9.9 mg/dL (ref 8.9–10.3)
Chloride: 101 mmol/L (ref 98–111)
Creatinine, Ser: 1.32 mg/dL — ABNORMAL HIGH (ref 0.44–1.00)
GFR, Estimated: 43 mL/min — ABNORMAL LOW (ref >60)
Glucose, Bld: 93 mg/dL (ref 70–99)
Potassium: 4.8 mmol/L (ref 3.5–5.1)
Sodium: 134 mmol/L — ABNORMAL LOW (ref 135–145)

## 2023-09-18 LAB — DIGOXIN LEVEL: Digoxin Level: 0.6 ng/mL — ABNORMAL LOW (ref 0.8–2.0)

## 2023-09-18 LAB — BRAIN NATRIURETIC PEPTIDE: B Natriuretic Peptide: 579.6 pg/mL — ABNORMAL HIGH (ref 0.0–100.0)

## 2023-09-18 NOTE — Patient Instructions (Signed)
 Medication Changes:  None, continue current medications  Lab Work:  Labs done today, your results will be available in MyChart, we will contact you for abnormal readings.   Special Instructions // Education:  Do the following things EVERYDAY: Weigh yourself in the morning before breakfast. Write it down and keep it in a log. Take your medicines as prescribed Eat low salt foods--Limit salt (sodium) to 2000 mg per day.  Stay as active as you can everyday Limit all fluids for the day to less than 2 liters   Follow-Up in: 2 months   At the [facility], you and your health needs are our priority. We have a designated team specialized in the treatment of Heart Failure. This Care Team includes your primary Heart Failure Specialized Cardiologist (physician), Advanced Practice Providers (APPs- Physician Assistants and Nurse Practitioners), and Pharmacist who all work together to provide you with the care you need, when you need it.   You may see any of the following providers on your designated Care Team at your next follow up:  Dr. Arvilla Meres Dr. Marca Ancona Dr. Dorthula Nettles Dr. Theresia Bough Tonye Becket, NP Robbie Lis, [place] Tirr [facility] [place], [place] Brynda Peon, NP [place] Lee, NP Karle Plumber, PharmD   Please be sure to bring in all your medications bottles to every appointment.   Need to Contact us:  If you have any questions or concerns before your next appointment please send [place] a message through [place] or call our office at [phone].    TO LEAVE A MESSAGE FOR THE NURSE SELECT OPTION 2, PLEASE LEAVE A MESSAGE INCLUDING: YOUR NAME DATE OF BIRTH CALL BACK NUMBER REASON FOR CALL**this is important as we prioritize the call backs  YOU WILL RECEIVE A CALL BACK THE SAME DAY AS LONG AS YOU CALL BEFORE 4:00 PM

## 2023-10-03 NOTE — Progress Notes (Signed)
 Remote ICD transmission.

## 2023-11-07 ENCOUNTER — Other Ambulatory Visit: Payer: Self-pay

## 2023-11-07 DIAGNOSIS — E039 Hypothyroidism, unspecified: Secondary | ICD-10-CM

## 2023-11-07 DIAGNOSIS — E871 Hypo-osmolality and hyponatremia: Secondary | ICD-10-CM

## 2023-11-21 ENCOUNTER — Ambulatory Visit: Payer: Federal, State, Local not specified - PPO

## 2023-11-21 DIAGNOSIS — I255 Ischemic cardiomyopathy: Secondary | ICD-10-CM | POA: Diagnosis not present

## 2023-11-21 NOTE — Progress Notes (Signed)
 ADVANCED HEART FAILURE CLINIC NOTE  Referring Physician: Lieutenant Reese.,*  Primary Care: Lieutenant Reese., MD Primary Cardiologist: Dr. Abel Hoe, Dr. Stann Earnest  CC: Stage D, End Stage chronic Systolic heart failure  HPI: Pam [name] is a 73 y.o. female with chronic systolic heart failure secondary to ischemic cardiomyopathy status post ICD, coronary artery disease with CTO of the mid LAD filled with left to left and right to left collaterals presenting today to establish care.  Her cardiac history dates back to December 2017 when she presented with an NSTEMI.  At that time echocardiogram demonstrated LVEF of 35 to 40% with preserved RV function.  Coronary angiography with heavily calcified CTO of the proximal to mid LAD filled by collaterals and calcified 80% stenosis of the circumflex/OM1.  Follow-up cardiac MRI demonstrated nonviable LAD territory.  She ultimately underwent atherectomy and IVUS guided PCI of the proximal circumflex/OM1.  In November 2020 she had a reduction in her LVEF to 25 to 30% and underwent Medtr[employer] ICD placement in early 2021.  According to Pam [name] she did fairly well for a few months however over the past several months has once again had some progression of dyspnea and a decrease in exercise capacity.  She was admitted in September 2023 with acute on chronic CHF exacerbation.  TTE and left heart cath from admission were stable compared to prior.  She was continued on medical therapy for CAD and started on low-dose GDMT for systolic heart failure.  Since that time she has been seen in [facility] clinic where GDMT was further uptitrated.  She also underwent PFTs that were significant for reversible obstructive lung disease and emphysema.  We previously met and discussed possibility of LVAD. At this time Pam [name] and her husband have decided against LVAD therapy. From a functional standpoint, she still goes grocery shopping, performs all ADLs independently; however, has  to move very slowly. Her Bps remain in the low 90s with very little lightheadedness. She is just finishing a course of antibiotics for an upper respiratory infection. Her husband reports that she is often 'staggering'.   Interval hx:  -Continues to have multiple days during which she feels very weak; lethargic.  -Reports that she feels she is getting sick with a URI today.  -We discussed CCM/barostim today; she will still like to continue medical management only at this time.   Activity level/exercise tolerance: NYHA IIIB Orthopnea:  Sleeps on 2 pillows Paroxysmal noctural dyspnea: yes,3-4x weekly Chest pain/pressure: Stable at this time Orthostatic lightheadedness: No Palpitations: No Lower extremity edema: No Presyncope/syncope: No Cough: No  Past Medical History:  Diagnosis Date   Abnormal EKG    AICD (automatic cardioverter/defibrillator) present    Alcoholic intoxication without complication (HCC)    Allergy    Cardiomyopathy, ischemic    Cataract    Chest pain 08/29/2016   Chest pain 08/29/2016   CHF (congestive heart failure) (HCC)    Chronic systolic heart failure (HCC) 08/29/2016   Facial laceration    Fall 07/07/2016   Hyperlipidemia    Hypertension 03/03/2017   Hyponatremia    Hypothyroidism 07/08/2016   NSTEMI (non-ST elevated myocardial infarction) (HCC)    Syncope    Thyroid  disease     Current Outpatient Medications  Medication Sig Dispense Refill   acetaminophen  (TYLENOL ) 500 MG tablet Take 1,000 mg by mouth every 6 (six) hours as needed for moderate pain.     albuterol  (VENTOLIN  HFA) 108 (90 Base) MCG/ACT inhaler INHALE 2 PUFFS INTO  THE LUNGS EVERY 6 HOURS AS NEEDED FOR WHEEZING OR SHORTNESS OF BREATH 6.7 g 0   alendronate  (FOSAMAX ) 70 MG tablet TAKE 1 TABLET(70 MG) BY MOUTH 1 TIME A WEEK WITH A FULL GLASS OF WATER AND ON AN EMPTY STOMACH 12 tablet 1   ANORO ELLIPTA  62.5-25 MCG/ACT AEPB INHALE 1 PUFF INTO THE LUNGS DAILY 60 each 5   atorvastatin   (LIPITOR ) 80 MG tablet TAKE 1 TABLET(80 MG) BY MOUTH DAILY 90 tablet 1   bisoprolol  (ZEBETA ) 5 MG tablet Take 0.5 tablets (2.5 mg total) by mouth daily.     Cholecalciferol (VITAMIN D3) 50 MCG (2000 UT) TABS Take 2,000 mcg by mouth daily.     clopidogrel  (PLAVIX ) 75 MG tablet TAKE 1 TABLET(75 MG) BY MOUTH DAILY WITH BREAKFAST 90 tablet 2   digoxin  62.5 MCG TABS Take 0.0625 mg by mouth daily.     furosemide  (LASIX ) 20 MG tablet Take 1 tablet (20 mg total) by mouth as needed. 30 tablet 10   JARDIANCE  10 MG TABS tablet TAKE 1 TABLET(10 MG) BY MOUTH DAILY 30 tablet 11   levothyroxine  (SYNTHROID ) 25 MCG tablet Take 37.5 mcg by mouth daily before breakfast. 180 tablet 1   nitroGLYCERIN  (NITROSTAT ) 0.4 MG SL tablet Place 1 tablet (0.4 mg total) under the tongue every 5 (five) minutes x 3 doses as needed for chest pain. 75 tablet 3   nystatin cream (MYCOSTATIN) Apply 1 Application topically 2 (two) times daily as needed (rash).     ranolazine  (RANEXA ) 1000 MG SR tablet Take 1 tablet (1,000 mg total) by mouth 2 (two) times daily. 180 tablet 3   sacubitril-valsartan (ENTRESTO ) 24-26 MG Take 1 tablet by mouth 2 (two) times daily. 60 tablet 11   spironolactone  (ALDACTONE ) 25 MG tablet TAKE 1 TABLET(25 MG) BY MOUTH DAILY 90 tablet 3   valACYclovir  (VALTREX ) 1000 MG tablet Take 1,000 mg by mouth daily.     No current facility-administered medications for this visit.    Allergies  Allergen Reactions   Lisinopril  Cough    Dry cough      Social History   Socioeconomic History   Marital status: Married    Spouse name: TEFL teacher   Number of children: 2   Years of education: Not on file   Highest education level: High school graduate  Occupational History   Occupation: Retired-worked for post office  Tobacco Use   Smoking status: Former    Current packs/day: 0.00    Average packs/day: 1 pack/day for 50.0 years (50.0 ttl pk-yrs)    Types: Cigarettes    Start date: 07/07/1966    Quit date: 07/07/2016     Years since quitting: 7.3   Smokeless tobacco: Never  Vaping Use   Vaping status: Never Used  Substance and Sexual Activity   Alcohol use: Not Currently   Drug use: Never   Sexual activity: Not Currently  Other Topics Concern   Not on file  Social History Narrative   Not on file   Social Drivers of Health   Financial Resource Strain: Low Risk  (04/29/2022)   Overall Financial Resource Strain (CARDIA)    Difficulty of Paying Living Expenses: Not hard at all  Food Insecurity: Low Risk  (06/09/2023)   Received from [facility]   Hunger Vital Sign    Worried About Running Out of Food in the Last Year: Never true    Ran Out of Food in the Last Year: Never true  Transportation Needs: No Transportation Needs (  06/09/2023)   Received from [facility]    In the past 12 months, has lack of reliable transportation kept you from medical appointments, meetings, work or from getting things needed for daily living? : No  Physical Activity: Not on file  Stress: Not on file  Social Connections: Not on file  Intimate Partner Violence: Not on file      Family History  Problem Relation Age of Onset   Kidney failure Mother    Heart disease Mother        CHF   Heart disease Sister    Colon cancer Neg Hx    Esophageal cancer Neg Hx    Rectal cancer Neg Hx    Stomach cancer Neg Hx     PHYSICAL EXAM: There were no vitals filed for this visit.  GENERAL: NAD Lungs- CTA  CARDIAC:  JVP: 5 cm          Normal rate with regular rhythm. no murmur.  Pulses intact. no edema.  ABDOMEN: Soft, non-tender, non-distended.  EXTREMITIES: Warm and well perfused.  NEUROLOGIC: No obvious FND    DATA REVIEW  ECG: Normal sinus rhythm  ECHO: 04/27/22: LVEF 25%-30%, RV function normal.  06/19/19: LVEF 25%-30%, Normal RV function.   CMR:  12/17: . Normal size and thickness of the left ventricle. Systolic function is moderately decreased (LVEF = 38%).  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 There is akinesis of the apical lateral, mid anterior, anteroseptal walls.  The LAD territory is non-viable, RCA and LCX territory is viable.  2. Normal right ventricular size, thickness and systolic function (RVEF = 54%) with no regional wall motion abnormalities.  3.  Normal biatrial size.  4.  Mild mitral and mild to moderate tricuspid regurgitation.  5. Normal size of the aortic root, ascending aorta and pulmonary artery.  6.  Normal pericardium, trivial pericardial effusion.   CATH: 04/29/22:  Prox LAD to Mid LAD lesion is 90% stenosed with 95% stenosed side branch in 1st Sept. Mid LAD to Dist LAD lesion is 100% stenosed.   Previously placed Prox Cx to Dist Cx stent of unknown type is  widely patent with 0% stenosed side branch in 1st Mrg.   Lat 1st Mrg lesion is 50% stenosed.   LV end diastolic pressure is moderately elevated.   There is no aortic valve stenosis.  08/11/22, RHC: HEMODYNAMICS: RA:                  1 mmHg (mean) RV:                  27/1 mmHg PA:                  31/5 mmHg (14 mean) PCWP:            5 mmHg (mean)                                      Estimated Fick CO/CI   2.87 L/min, 1.89 L/min/m2                                          TPG  9  mmHg                                              PVR                 3.14 Wood Units  PAPi                >4  ASSESSMENT & PLAN:  Stage D, End stage-NYHA IIIB, Systolic heart failure Etiology of HF: Secondary to ischemic cardiomyopathy with nonviable myocardium as reported above NYHA class / AHA Stage: NYHA III Volume status & Diuretics:  hypovolemic on exam; continue to take lasix  PRN only.  Vasodilators: continue Entresto  24/26mg  BID; repeat BMP/BNP today Beta-Blocker: continue bisoprolol  to 2.5mg  daily. Continue digoxin  62.5mcg; will repeat digoxin  level today. Level was elevated previously, decreased dose to 62.5mcg.  MRA:  Continue spironolactone  25mg  daily Cardiometabolic: Jardiance  10 mg daily Devices therapies & Valvulopathies: Primary prevention ICD in place Advanced therapies: CPX w/ VE/VCO2 slop of 69.4 and PVO2 of 13.2; CPX consistent with moderate to severe cardiac limitation in addition to obstructive lung disease. I had a lengthy discussion with the patient today regarding advanced therapies. Her COPD, age and frailty make her a marginal LVAD candidate, however, she has no absolute contraindications to durable VAD support. RHC last week with cardiac index of 1.89L/min/m2. Today we had a very length discussion with our LVAD coordinator Adams Adams. We discussed all her prior testing including CPX, RHC, echocardiograms and prognosis of patients with stage D heart failure. We also discussed our LVAD outcomes and national outcomes. After multiple lengthy discussions with Ms. Staff and her husband, they have decided against LVAD at this time. She is slowly showing signs of progressive LV failure including hypotension, bradycardia; we will likely have to start decreasing GDMT doses moving forward.  12/13/22: I remain concerned about her prognosis over the next year. She continues to lose weight secondary to cardiac cachexia. She is otherwise very independent with good social support, however, not interested in advanced therapies including LVAD, BATwire, etc. Will repeat labs today. No med changes.   2.  COPD -Now followed by pulmonology; started on inhalers.  - stable on exam; no wheezing.   3.  Coronary artery disease -Management as per primary cardiology -no CP; continue ranexa .   4. Primary prevention ICD - Device interrogation today with no events; normal optivol; no AF/AT.    Aditya Sabharwal Advanced Heart Failure Mechanical Circulatory Support

## 2023-11-22 ENCOUNTER — Encounter (HOSPITAL_COMMUNITY): Payer: Self-pay

## 2023-11-22 ENCOUNTER — Ambulatory Visit (HOSPITAL_COMMUNITY)
Admission: RE | Admit: 2023-11-22 | Discharge: 2023-11-22 | Disposition: A | Discharge status: Home / Self Care | Payer: Federal, State, Local not specified - PPO | Source: Ambulatory Visit | Attending: Family Medicine | Admitting: Family Medicine

## 2023-11-22 ENCOUNTER — Other Ambulatory Visit (HOSPITAL_COMMUNITY): Payer: Self-pay | Admitting: *Deleted

## 2023-11-22 ENCOUNTER — Telehealth (HOSPITAL_COMMUNITY): Payer: Self-pay

## 2023-11-22 VITALS — BP 118/56 | HR 55 | Wt 108.4 lb

## 2023-11-22 DIAGNOSIS — E88A Wasting disease (syndrome) due to underlying condition: Secondary | ICD-10-CM | POA: Diagnosis not present

## 2023-11-22 DIAGNOSIS — I5084 End stage heart failure: Secondary | ICD-10-CM | POA: Insufficient documentation

## 2023-11-22 DIAGNOSIS — L89159 Pressure ulcer of sacral region, unspecified stage: Secondary | ICD-10-CM | POA: Diagnosis not present

## 2023-11-22 DIAGNOSIS — Z95810 Presence of automatic (implantable) cardiac defibrillator: Secondary | ICD-10-CM | POA: Diagnosis not present

## 2023-11-22 DIAGNOSIS — I5020 Unspecified systolic (congestive) heart failure: Secondary | ICD-10-CM | POA: Diagnosis not present

## 2023-11-22 DIAGNOSIS — J439 Emphysema, unspecified: Secondary | ICD-10-CM | POA: Insufficient documentation

## 2023-11-22 DIAGNOSIS — I255 Ischemic cardiomyopathy: Secondary | ICD-10-CM | POA: Diagnosis not present

## 2023-11-22 DIAGNOSIS — I252 Old myocardial infarction: Secondary | ICD-10-CM | POA: Diagnosis not present

## 2023-11-22 DIAGNOSIS — Z87891 Personal history of nicotine dependence: Secondary | ICD-10-CM | POA: Diagnosis not present

## 2023-11-22 DIAGNOSIS — L89151 Pressure ulcer of sacral region, stage 1: Secondary | ICD-10-CM

## 2023-11-22 DIAGNOSIS — I2510 Atherosclerotic heart disease of native coronary artery without angina pectoris: Secondary | ICD-10-CM | POA: Diagnosis not present

## 2023-11-22 DIAGNOSIS — Z79899 Other long term (current) drug therapy: Secondary | ICD-10-CM | POA: Diagnosis not present

## 2023-11-22 DIAGNOSIS — J449 Chronic obstructive pulmonary disease, unspecified: Secondary | ICD-10-CM

## 2023-11-22 DIAGNOSIS — I5022 Chronic systolic (congestive) heart failure: Secondary | ICD-10-CM | POA: Insufficient documentation

## 2023-11-22 DIAGNOSIS — Z4509 Encounter for adjustment and management of other cardiac device: Secondary | ICD-10-CM | POA: Insufficient documentation

## 2023-11-22 DIAGNOSIS — Z955 Presence of coronary angioplasty implant and graft: Secondary | ICD-10-CM | POA: Insufficient documentation

## 2023-11-22 DIAGNOSIS — I110 Hypertensive heart disease with heart failure: Secondary | ICD-10-CM | POA: Insufficient documentation

## 2023-11-22 LAB — BASIC METABOLIC PANEL WITH GFR
Anion gap: 11 (ref 5–15)
BUN: 14 mg/dL (ref 8–23)
CO2: 22 mmol/L (ref 22–32)
Calcium: 9.8 mg/dL (ref 8.9–10.3)
Chloride: 97 mmol/L — ABNORMAL LOW (ref 98–111)
Creatinine, Ser: 1.12 mg/dL — ABNORMAL HIGH (ref 0.44–1.00)
GFR, Estimated: 52 mL/min — ABNORMAL LOW (ref >60)
Glucose, Bld: 94 mg/dL (ref 70–99)
Potassium: 5.2 mmol/L — ABNORMAL HIGH (ref 3.5–5.1)
Sodium: 130 mmol/L — ABNORMAL LOW (ref 135–145)

## 2023-11-22 LAB — CUP PACEART REMOTE DEVICE CHECK
Battery Remaining Longevity: 96 mo
Battery Voltage: 3.01 V
Brady Statistic RV Percent Paced: 0.01 %
Date Time Interrogation Session: 20250422001602
HighPow Impedance: 61 Ohm
Implantable Lead Connection Status: 753985
Implantable Lead Implant Date: 20210127
Implantable Lead Location: 753860
Implantable Pulse Generator Implant Date: 20210127
Lead Channel Impedance Value: 323 Ohm
Lead Channel Impedance Value: 399 Ohm
Lead Channel Pacing Threshold Amplitude: 0.5 V
Lead Channel Pacing Threshold Pulse Width: 0.4 ms
Lead Channel Sensing Intrinsic Amplitude: 7.75 mV
Lead Channel Sensing Intrinsic Amplitude: 7.75 mV
Lead Channel Setting Pacing Amplitude: 2 V
Lead Channel Setting Pacing Pulse Width: 0.4 ms
Lead Channel Setting Sensing Sensitivity: 0.3 mV
Zone Setting Status: 755011
Zone Setting Status: 755011

## 2023-11-22 LAB — DIGOXIN LEVEL: Digoxin Level: 0.7 ng/mL — ABNORMAL LOW (ref 0.8–2.0)

## 2023-11-22 LAB — BRAIN NATRIURETIC PEPTIDE: B Natriuretic Peptide: 472 pg/mL — ABNORMAL HIGH (ref 0.0–100.0)

## 2023-11-22 MED ORDER — DIGOXIN 125 MCG PO TABS: 0.0625 mg | ORAL_TABLET | Freq: Every day | ORAL | 3 refills | Status: DC

## 2023-11-22 NOTE — Telephone Encounter (Addendum)
 Pt aware, agreeable, and verbalized understanding   ----- Message from Pam [name] sent at 11/22/2023  3:06 PM EDT ----- K up a bit. Please reduce K-rich foods in diet

## 2023-11-22 NOTE — Patient Instructions (Addendum)
 Thank you for coming in today  If you had labs drawn today, any labs that are abnormal the clinic will call you No news is good news  Medications: STOP Jardiance   Follow up appointments:  Your physician recommends that you schedule a follow-up appointment in:  3-4 months With Dr. Bruce Caper  Please call our office to schedule the follow-up appointment in June for August 2025 .    Do the following things EVERYDAY: Weigh yourself in the morning before breakfast. Write it down and keep it in a log. Take your medicines as prescribed Eat low salt foods--Limit salt (sodium) to 2000 mg per day.  Stay as active as you can everyday Limit all fluids for the day to less than 2 liters   At the [facility], you and your health needs are our priority. As part of our continuing mission to provide you with exceptional heart care, we have created designated Provider Care Teams. These Care Teams include your primary Cardiologist (physician) and Advanced Practice Providers (APPs- Physician Assistants and Nurse Practitioners) who all work together to provide you with the care you need, when you need it.   You may see any of the following providers on your designated Care Team at your next follow up: Dr Jules Oar Dr Peder Bourdon Dr. Mimi Alt, NP Ruddy Corral, [place] [facility] [place], [place] Dennise Fitz, NP Luster Salters, PharmD   Please be sure to bring in all your medications bottles to every appointment.    Thank you for choosing [facility]  If you have any questions or concerns before your next appointment please send us  a message through [place] or call our office at [phone].    TO LEAVE A MESSAGE FOR THE NURSE SELECT OPTION 2, PLEASE LEAVE A MESSAGE INCLUDING: YOUR NAME DATE OF BIRTH CALL BACK NUMBER REASON FOR CALL**this is important as we prioritize the call backs  YOU WILL RECEIVE A  CALL BACK THE SAME DAY AS LONG AS YOU CALL BEFORE 4:00 PM

## 2023-11-23 ENCOUNTER — Other Ambulatory Visit: Payer: Self-pay | Admitting: Pulmonary Disease

## 2023-11-23 DIAGNOSIS — J432 Centrilobular emphysema: Secondary | ICD-10-CM

## 2023-11-24 ENCOUNTER — Other Ambulatory Visit: Payer: Self-pay

## 2023-11-24 MED ORDER — CLOPIDOGREL BISULFATE 75 MG PO TABS: ORAL_TABLET | ORAL | 2 refills | Status: DC

## 2023-11-24 NOTE — Telephone Encounter (Signed)
 This is a CHF pt

## 2023-11-27 ENCOUNTER — Other Ambulatory Visit (HOSPITAL_COMMUNITY): Payer: Self-pay | Admitting: *Deleted

## 2023-11-27 ENCOUNTER — Other Ambulatory Visit: Payer: Federal, State, Local not specified - PPO

## 2023-11-27 MED ORDER — CLOPIDOGREL BISULFATE 75 MG PO TABS: ORAL_TABLET | ORAL | 2 refills | Status: DC

## 2023-11-28 ENCOUNTER — Encounter: Payer: Self-pay | Admitting: Endocrinology

## 2023-11-28 LAB — BASIC METABOLIC PANEL WITH GFR
BUN: 14 mg/dL (ref 7–25)
CO2: 28 mmol/L (ref 20–32)
Calcium: 10.1 mg/dL (ref 8.6–10.4)
Chloride: 96 mmol/L — ABNORMAL LOW (ref 98–110)
Creat: 0.98 mg/dL (ref 0.60–1.00)
Glucose, Bld: 106 mg/dL — ABNORMAL HIGH (ref 65–99)
Potassium: 5 mmol/L (ref 3.5–5.3)
Sodium: 133 mmol/L — ABNORMAL LOW (ref 135–146)
eGFR: 61 mL/min/{1.73_m2} (ref >=60)

## 2023-11-28 LAB — T4, FREE: Free T4: 1.2 ng/dL (ref 0.8–1.8)

## 2023-11-28 LAB — TSH: TSH: 4.01 m[IU]/L (ref 0.40–4.50)

## 2023-11-30 ENCOUNTER — Telehealth: Payer: Self-pay

## 2023-11-30 ENCOUNTER — Ambulatory Visit: Payer: Federal, State, Local not specified - PPO | Admitting: Endocrinology

## 2023-11-30 ENCOUNTER — Encounter: Payer: Self-pay | Admitting: Endocrinology

## 2023-11-30 VITALS — BP 104/60 | HR 60 | Resp 20 | Ht 63.0 in | Wt 108.0 lb

## 2023-11-30 DIAGNOSIS — E559 Vitamin D deficiency, unspecified: Secondary | ICD-10-CM

## 2023-11-30 DIAGNOSIS — M810 Age-related osteoporosis without current pathological fracture: Secondary | ICD-10-CM | POA: Diagnosis not present

## 2023-11-30 DIAGNOSIS — E039 Hypothyroidism, unspecified: Secondary | ICD-10-CM

## 2023-11-30 DIAGNOSIS — Z8639 Personal history of other endocrine, nutritional and metabolic disease: Secondary | ICD-10-CM

## 2023-11-30 DIAGNOSIS — Z131 Encounter for screening for diabetes mellitus: Secondary | ICD-10-CM

## 2023-11-30 DIAGNOSIS — E871 Hypo-osmolality and hyponatremia: Secondary | ICD-10-CM | POA: Diagnosis not present

## 2023-11-30 NOTE — Progress Notes (Signed)
 Outpatient Endocrinology Note [place] Aylanie Cubillos, MD  11/30/23  Patient's Name: Pam [name]    DOB: [birth year]    MRN: [id number]  REASON OF VISIT: Follow up of osteoporosis /hypothyroidism   PCP: Lieutenant Reese., MD  HISTORY OF PRESENT ILLNESS:   Pam [name] is a 73 y.o. old female with past medical history listed below, is here for follow up of bone health issues / osteoporosis /hypothyroidism/hyponatremia.  Pertinent  History: Patient was previously seen by Dr. Hubert Madden and was last time seen in July 2024.  # Osteoporosis Screening bone density showed osteoporosis at the hip and radius as of 07/2019. Osteoporosis probably related to primary hyperparathyroidism.  Patient has history of primary hyperparathyroidism status post right superior parathyroidectomy in July 2022.   She has been on alendronate  70 mg weekly since 12 / 2020.     Bone density results show improvement as of 03/2022:    Lumbar spine L1-L2 (L3,L 4) Femoral neck (FN) 33% distal radius Ultra distal radius    T-score -0.8 RFN: -2.6 LFN: -2.2 -2.1 -2.9  Change in BMD from previous DXA test (%) +4.0% +3.8% -6.2% N/a    07/2019 Lumbar spine L1-L3 (L4) Femoral neck (FN) 33% distal radius Ultra distal radius  T-score -1.0 RFN: -2.8 LFN: -2.4  -1.6  -2.7    Patient has been taking calcium  1 tablet daily and vitamin D3 2000 units daily.   # Primary hypothyroidism -Patient has mild hypothyroidism asymptomatic at the time of diagnosis.  Initially levothyroxine  was started 37.5 mcg daily by primary care provider.  # Hyponatremia She has had long-term hyponatremia of unclear etiology, not on thiazide diuretics except Lasix  Also not on any other drugs causing hyponatremia. Previously urine osmolality was relatively high at 332 and urine sodium last 39. Has been advised to be restricting her fluids and cut back drinking fluids including water, coffee, juices and occasional diet drinks.  Serum sodium has been in the low  130s range.   Interval history  Recent lab results reviewed with stable serum sodium, normal renal function.  Mild elevated serum sodium 106 in the acceptable range.  She has been taking Fosamax  weekly.  She has been taking levothyroxine  37.5 Mg daily.  No hypo and hyperthyroid symptoms.  Recent thyroid  function test normal.  She has no fall and fracture.  She has been taking vitamin D  and calcium  supplement.  No other complaints today.   REVIEW OF SYSTEMS:  As per history of present illness.   PAST MEDICAL HISTORY: Past Medical History:  Diagnosis Date   Abnormal EKG    AICD (automatic cardioverter/defibrillator) present    Alcoholic intoxication without complication (HCC)    Allergy    Cardiomyopathy, ischemic    Cataract    Chest pain 08/29/2016   Chest pain 08/29/2016   CHF (congestive heart failure) (HCC)    Chronic systolic heart failure (HCC) 08/29/2016   Facial laceration    Fall 07/07/2016   Hyperlipidemia    Hypertension 03/03/2017   Hyponatremia    Hypothyroidism 07/08/2016   NSTEMI (non-ST elevated myocardial infarction) (HCC)    Syncope    Thyroid  disease     PAST SURGICAL HISTORY: Past Surgical History:  Procedure Laterality Date   CARDIAC CATHETERIZATION N/A 07/08/2016   Procedure: Left Heart Cath and Coronary Angiography;  Surgeon: Sammy Crisp, MD;  Location: [facility] INVASIVE CV LAB;  Service: Cardiovascular;  Laterality: N/A;   CARDIAC CATHETERIZATION N/A 08/29/2016   Procedure: Coronary Atherectomy;  Surgeon: Sammy Crisp, MD;  Location: [facility] INVASIVE CV LAB;  Service: Cardiovascular;  Laterality: N/A;   CARDIAC CATHETERIZATION N/A 08/29/2016   Procedure: Coronary Stent Intervention;  Surgeon: Sammy Crisp, MD;  Location: [facility] INVASIVE CV LAB;  Service: Cardiovascular;  Laterality: N/A;   CATARACT EXTRACTION, BILATERAL     COLONOSCOPY WITH PROPOFOL  N/A 11/18/2021   Procedure: COLONOSCOPY WITH PROPOFOL ;  Surgeon: Lindle Rhea, MD;  Location: [facility]  ENDOSCOPY;  Service: Gastroenterology;  Laterality: N/A;   CORONARY ANGIOPLASTY     coronary stents      ICD IMPLANT N/A 08/28/2019   Procedure: ICD IMPLANT;  Surgeon: Lei Pump, MD;  Location: [facility] INVASIVE CV LAB;  Service: Cardiovascular;  Laterality: N/A;   LEFT HEART CATH AND CORONARY ANGIOGRAPHY N/A 04/29/2022   Procedure: LEFT HEART CATH AND CORONARY ANGIOGRAPHY;  Surgeon: Arleen Lacer, MD;  Location: [facility] INVASIVE CV LAB;  Service: Cardiovascular;  Laterality: N/A;   PARATHYROIDECTOMY Right 02/02/2021   Procedure: RIGHT SUPERIOR PARATHYROIDECTOMY;  Surgeon: Oralee Billow, MD;  Location: [facility] ORS;  Service: General;  Laterality: Right;   POLYPECTOMY  11/18/2021   Procedure: POLYPECTOMY;  Surgeon: Lindle Rhea, MD;  Location: [facility] ENDOSCOPY;  Service: Gastroenterology;;   RIGHT HEART CATH N/A 08/11/2022   Procedure: RIGHT HEART CATH;  Surgeon: Alwin Baars, DO;  Location: [facility] INVASIVE CV LAB;  Service: Cardiovascular;  Laterality: N/A;   VAGINAL HYSTERECTOMY      ALLERGIES: Allergies  Allergen Reactions   Lisinopril  Cough    Dry cough    FAMILY HISTORY:  Family History  Problem Relation Age of Onset   Kidney failure Mother    Heart disease Mother        CHF   Heart disease Sister    Colon cancer Neg Hx    Esophageal cancer Neg Hx    Rectal cancer Neg Hx    Stomach cancer Neg Hx     SOCIAL HISTORY: Social History   Socioeconomic History   Marital status: Married    Spouse name: TEFL teacher   Number of children: 2   Years of education: Not on file   Highest education level: High school graduate  Occupational History   Occupation: Retired-worked for post [employer]  Tobacco Use   Smoking status: Former    Current packs/day: 0.00    Average packs/day: 1 pack/day for 50.0 years (50.0 ttl pk-yrs)    Types: Cigarettes    Start date: 07/07/1966    Quit date: 07/07/2016    Years since quitting: 7.4   Smokeless tobacco: Never  Vaping Use   Vaping status: Never  Used  Substance and Sexual Activity   Alcohol use: Not Currently   Drug use: Never   Sexual activity: Not Currently  Other Topics Concern   Not on file  Social History Narrative   Not on file   Social Drivers of Health   Financial Resource Strain: Low Risk  (04/29/2022)   Overall Financial Resource Strain (CARDIA)    Difficulty of Paying Living Expenses: Not hard at all  Food Insecurity: Low Risk  (06/09/2023)   Received from [facility]   Hunger Vital Sign    Worried About Running Out of Food in the Last Year: Never true    Ran Out of Food in the Last Year: Never true  Transportation Needs: No Transportation Needs (06/09/2023)   Received from [facility]    In the past 12 months, has lack of reliable transportation kept you from medical appointments, meetings, work or from  getting things needed for daily living? : No  Physical Activity: Not on file  Stress: Not on file  Social Connections: Not on file    MEDICATIONS:  Current Outpatient Medications  Medication Sig Dispense Refill   acetaminophen  (TYLENOL ) 500 MG tablet Take 1,000 mg by mouth every 6 (six) hours as needed for moderate pain.     albuterol  (VENTOLIN  HFA) 108 (90 Base) MCG/ACT inhaler INHALE 2 PUFFS INTO THE LUNGS EVERY 6 HOURS AS NEEDED FOR WHEEZING OR SHORTNESS OF BREATH 6.7 g 0   alendronate  (FOSAMAX ) 70 MG tablet TAKE 1 TABLET(70 MG) BY MOUTH 1 TIME A WEEK WITH A FULL GLASS OF WATER AND ON AN EMPTY STOMACH 12 tablet 1   atorvastatin  (LIPITOR ) 80 MG tablet TAKE 1 TABLET(80 MG) BY MOUTH DAILY 90 tablet 1   bisoprolol  (ZEBETA ) 5 MG tablet Take 0.5 tablets (2.5 mg total) by mouth daily.     Cholecalciferol (VITAMIN D3) 50 MCG (2000 UT) TABS Take 2,000 mcg by mouth daily.     clopidogrel  (PLAVIX ) 75 MG tablet TAKE 1 TABLET(75 MG) BY MOUTH DAILY WITH BREAKFAST 90 tablet 2   digoxin  (LANOXIN ) 0.125 MG tablet Take 0.5 tablets (0.0625 mg total) by mouth daily. 45 tablet 3   furosemide  (LASIX ) 20 MG  tablet Take 1 tablet (20 mg total) by mouth as needed. 30 tablet 10   levothyroxine  (SYNTHROID ) 25 MCG tablet Take 37.5 mcg by mouth daily before breakfast. 180 tablet 1   nitroGLYCERIN  (NITROSTAT ) 0.4 MG SL tablet Place 1 tablet (0.4 mg total) under the tongue every 5 (five) minutes x 3 doses as needed for chest pain. 75 tablet 3   nystatin cream (MYCOSTATIN) Apply 1 Application topically 2 (two) times daily as needed (rash).     ranolazine  (RANEXA ) 1000 MG SR tablet Take 1 tablet (1,000 mg total) by mouth 2 (two) times daily. 180 tablet 3   sacubitril-valsartan (ENTRESTO ) 24-26 MG Take 1 tablet by mouth 2 (two) times daily. 60 tablet 11   spironolactone  (ALDACTONE ) 25 MG tablet TAKE 1 TABLET(25 MG) BY MOUTH DAILY 90 tablet 3   umeclidinium-vilanterol (ANORO ELLIPTA ) 62.5-25 MCG/ACT AEPB INHALE 1 PUFF INTO THE LUNGS DAILY 60 each 0   valACYclovir  (VALTREX ) 1000 MG tablet Take 1,000 mg by mouth daily.     No current facility-administered medications for this visit.    PHYSICAL EXAM: Vitals:   11/30/23 0900  BP: 104/60  Pulse: 60  Resp: 20  SpO2: 95%  Weight: 108 lb (49 kg)  Height: 5\' 3"  (1.6 m)    Body mass index is 19.13 kg/m.   General: Well developed, well nourished female in no apparent distress.  HEENT: AT/[place], no external lesions. Hearing intact to the spoken word Eyes: Conjunctiva clear and no icterus. Neck: Trachea midline, neck supple , anterior neck scar present status post parathyroidectomy. Lungs: Clear to auscultation, no wheeze. Respirations not labored Neurologic: Alert, oriented, normal speech, deep tendon biceps reflexes normal,  no gross focal neurological deficit Extremities: No pedal pitting edema, no tremors of outstretched hands. No spine tenderness Skin: Warm, color good.  Psychiatric: Does not appear depressed or anxious  PERTINENT HISTORIC LABORATORY AND IMAGING STUDIES:  All pertinent laboratory results were reviewed. Please see HPI also for further  details.   Lab Results  Component Value Date   ALKPHOS 39 05/31/2022   ALKPHOS 129 (H) 04/26/2022   ALKPHOS 52 09/21/2020    ASSESSMENT / PLAN  1. Age-related osteoporosis without current pathological fracture  2. Screening for diabetes mellitus (DM)   3. Hypothyroidism, adult   4. Hyponatremia   5. History of primary hyperparathyroidism   6. History of hyperparathyroidism   7. Vitamin D  deficiency     # Osteoporosis -Patient was started on alendronate  70 mg weekly in December 2020. -Continue alendronate /Fosamax  70 mg weekly.  Will likely treat with Fosamax  for total of 5 years and consider drug holiday. -Last DEXA scan was in August 2023.  Will repeat DEXA scan in fall 2025.  Order placed. -Recent vitamin D  level normal.  Continue current vitamin D  supplement and calcium  supplement. -Discussed about fall precautions.  Discussed about weightbearing exercise as tolerated.  # Patient has history of primary hyperparathyroidism status post right superior parathyroidectomy in July 2022.  # Hypothyroidism -Likely subclinical, recent thyroid  lab normal.  Continue current dose of levothyroxine  37.5 mcg daily.  Patient thyroid  function test normal.  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 -Advised for limiting excessive intake of fluids for example water and juice. -Patient reports she had salt tablet in the past however not taking lately due to heart problem. - Stable serum sodium in low 130 range.   Diagnoses and all orders for this visit:  Age-related osteoporosis without current pathological fracture -     Basic Metabolic Panel Without GFR -     DG Bone Density; Future  Screening for diabetes mellitus (DM) -     Hemoglobin A1c  Hypothyroidism, adult -     T4, free -     TSH  Hyponatremia  History of primary hyperparathyroidism  History of hyperparathyroidism  Vitamin D  deficiency -     VITAMIN D  25 Hydroxy (Vit-D Deficiency,  Fractures)     DISPOSITION Follow up in clinic in 6 months suggested.  Labs few days prior to follow-up visit as ordered.  All questions answered and patient verbalized understanding of the plan.  [place] Daphna Lafuente, MD [facility] Endocrinology [facility] [address] [place], [address] [place], [place] [address] Phone # [phone]  At least part of this note was generated using voice recognition software. Inadvertent word errors may have occurred, which were not recognized during the proofreading process.

## 2023-11-30 NOTE — Telephone Encounter (Signed)
 Dexa scan scheduled for 03/05/24 at 9:30 am. Patient sent info and contact number in case needed to make changes.

## 2023-12-05 ENCOUNTER — Encounter: Payer: Self-pay | Admitting: Cardiology

## 2023-12-20 ENCOUNTER — Encounter (HOSPITAL_COMMUNITY): Payer: Self-pay

## 2023-12-20 ENCOUNTER — Emergency Department (HOSPITAL_COMMUNITY)
Admission: EM | Admit: 2023-12-20 | Discharge: 2023-12-20 | Disposition: A | Discharge status: Home / Self Care | Attending: Emergency Medicine | Admitting: Emergency Medicine

## 2023-12-20 DIAGNOSIS — R799 Abnormal finding of blood chemistry, unspecified: Secondary | ICD-10-CM | POA: Diagnosis present

## 2023-12-20 DIAGNOSIS — E871 Hypo-osmolality and hyponatremia: Secondary | ICD-10-CM | POA: Diagnosis not present

## 2023-12-20 DIAGNOSIS — E039 Hypothyroidism, unspecified: Secondary | ICD-10-CM | POA: Diagnosis not present

## 2023-12-20 DIAGNOSIS — I2510 Atherosclerotic heart disease of native coronary artery without angina pectoris: Secondary | ICD-10-CM | POA: Insufficient documentation

## 2023-12-20 DIAGNOSIS — N189 Chronic kidney disease, unspecified: Secondary | ICD-10-CM | POA: Diagnosis not present

## 2023-12-20 DIAGNOSIS — I130 Hypertensive heart and chronic kidney disease with heart failure and stage 1 through stage 4 chronic kidney disease, or unspecified chronic kidney disease: Secondary | ICD-10-CM | POA: Insufficient documentation

## 2023-12-20 DIAGNOSIS — Z79899 Other long term (current) drug therapy: Secondary | ICD-10-CM | POA: Insufficient documentation

## 2023-12-20 DIAGNOSIS — I5022 Chronic systolic (congestive) heart failure: Secondary | ICD-10-CM | POA: Diagnosis not present

## 2023-12-20 LAB — OSMOLALITY: Osmolality: 270 mosm/kg — ABNORMAL LOW (ref 275–295)

## 2023-12-20 LAB — COMPREHENSIVE METABOLIC PANEL WITH GFR
ALT: 15 U/L (ref 0–44)
AST: 19 U/L (ref 15–41)
Albumin: 3.9 g/dL (ref 3.5–5.0)
Alkaline Phosphatase: 26 U/L — ABNORMAL LOW (ref 38–126)
Anion gap: 11 (ref 5–15)
BUN: 11 mg/dL (ref 8–23)
CO2: 25 mmol/L (ref 22–32)
Calcium: 9.6 mg/dL (ref 8.9–10.3)
Chloride: 91 mmol/L — ABNORMAL LOW (ref 98–111)
Creatinine, Ser: 1.02 mg/dL — ABNORMAL HIGH (ref 0.44–1.00)
GFR, Estimated: 58 mL/min — ABNORMAL LOW (ref >60)
Glucose, Bld: 102 mg/dL — ABNORMAL HIGH (ref 70–99)
Potassium: 4.4 mmol/L (ref 3.5–5.1)
Sodium: 127 mmol/L — ABNORMAL LOW (ref 135–145)
Total Bilirubin: 0.7 mg/dL (ref 0.0–1.2)
Total Protein: 6.5 g/dL (ref 6.5–8.1)

## 2023-12-20 LAB — CBC WITH DIFFERENTIAL/PLATELET
Abs Immature Granulocytes: 0.06 10*3/uL (ref 0.00–0.07)
Basophils Absolute: 0 10*3/uL (ref 0.0–0.1)
Basophils Relative: 1 %
Eosinophils Absolute: 0 10*3/uL (ref 0.0–0.5)
Eosinophils Relative: 0 %
HCT: 39.5 % (ref 36.0–46.0)
Hemoglobin: 14 g/dL (ref 12.0–15.0)
Immature Granulocytes: 1 %
Lymphocytes Relative: 29 %
Lymphs Abs: 1.9 10*3/uL (ref 0.7–4.0)
MCH: 35.4 pg — ABNORMAL HIGH (ref 26.0–34.0)
MCHC: 35.4 g/dL (ref 30.0–36.0)
MCV: 100 fL (ref 80.0–100.0)
Monocytes Absolute: 0.7 10*3/uL (ref 0.1–1.0)
Monocytes Relative: 10 %
Neutro Abs: 3.7 10*3/uL (ref 1.7–7.7)
Neutrophils Relative %: 59 %
Platelets: 251 10*3/uL (ref 150–400)
RBC: 3.95 MIL/uL (ref 3.87–5.11)
RDW: 14.2 % (ref 11.5–15.5)
WBC: 6.4 10*3/uL (ref 4.0–10.5)
nRBC: 0 % (ref 0.0–0.2)

## 2023-12-20 LAB — BASIC METABOLIC PANEL WITH GFR
Anion gap: 9 (ref 5–15)
BUN: 12 mg/dL (ref 8–23)
CO2: 24 mmol/L (ref 22–32)
Calcium: 9.1 mg/dL (ref 8.9–10.3)
Chloride: 96 mmol/L — ABNORMAL LOW (ref 98–111)
Creatinine, Ser: 0.92 mg/dL (ref 0.44–1.00)
GFR, Estimated: >60 mL/min (ref >60)
Glucose, Bld: 112 mg/dL — ABNORMAL HIGH (ref 70–99)
Potassium: 3.8 mmol/L (ref 3.5–5.1)
Sodium: 129 mmol/L — ABNORMAL LOW (ref 135–145)

## 2023-12-20 LAB — URINALYSIS, ROUTINE W REFLEX MICROSCOPIC
Bilirubin Urine: NEGATIVE
Glucose, UA: NEGATIVE mg/dL
Hgb urine dipstick: NEGATIVE
Ketones, ur: NEGATIVE mg/dL
Leukocytes,Ua: NEGATIVE
Nitrite: NEGATIVE
Protein, ur: NEGATIVE mg/dL
Specific Gravity, Urine: 1.009 (ref 1.005–1.030)
pH: 6 (ref 5.0–8.0)

## 2023-12-20 LAB — OSMOLALITY, URINE: Osmolality, Ur: 317 mosm/kg (ref 300–900)

## 2023-12-20 LAB — SODIUM, URINE, RANDOM: Sodium, Ur: 49 mmol/L

## 2023-12-20 MED ORDER — SODIUM CHLORIDE 0.9 % IV BOLUS
500.0000 mL | Freq: Once | INTRAVENOUS | Status: AC
  Administered 2023-12-20: 500 mL via INTRAVENOUS

## 2023-12-20 NOTE — ED Provider Triage Note (Signed)
 Emergency Medicine Provider Triage Evaluation Note  Pam [name] , a 73 y.o. female  was evaluated in triage.  Pt complains of HTN hypothyroidism hyperlipidemia CKD CAD with ICM and heart failure COPD and osteoporosis here with abnormal labs.  Reports she had blood work this morning, told that she had a low sodium, no prior history of alcohol abuse, prior history of hyponatremia with a sodium as low as 130 but never this low.  Asymptomatic.  Review of Systems  Positive:  Negative:   Physical Exam  BP (!) 150/75 (BP Location: Right Arm)   Pulse 64   Temp 97.8 F (36.6 C)   Resp 16   Ht 5\' 3"  (1.6 m)   Wt 49 kg   SpO2 97%   BMI 19.13 kg/m  Gen:   Awake, no distress   Resp:  Normal effort  MSK:   Moves extremities without difficulty  Other:    Medical Decision Making  Medically screening exam initiated at 3:19 PM.  Appropriate orders placed.  Pam [name] was informed that the remainder of the evaluation will be completed by another provider, this initial triage assessment does not replace that evaluation, and the importance of remaining in the ED until their evaluation is complete.     Sundeep Cary, PA-C 12/20/23 [phone]

## 2023-12-20 NOTE — ED Triage Notes (Signed)
 Pt presents d/t abnormal lab during routine physical.  Pt sts Sodium 124.  Pt denies complaints.

## 2023-12-20 NOTE — Discharge Instructions (Addendum)
 Your sodium level improved to 129 after the fluids in the emergency department.  Please call your primary care doctor to have your sodium level rechecked in 24-48 hours.  Return for any concerning symptoms.  Cut back on your caffeine intake.  Recommend you drink some sports drink including Gatorade 0.  Do not take Lasix  unless you notice signs of swelling.  To help with your constipation you can take some MiraLAX.

## 2023-12-20 NOTE — ED Provider Notes (Signed)
 [place] EMERGENCY DEPARTMENT AT [facility] Provider Note   CSN: [id number] Arrival date & time: 12/20/23  1502     History  Chief Complaint  Patient presents with   Abnormal Labs    Pam [name] is a 73 y.o. female.  Pt complains of HTN hypothyroidism hyperlipidemia CKD CAD with ICM and heart failure COPD and osteoporosis here with abnormal labs.  Reports she had blood work this morning, told that she had a low sodium, no prior history of alcohol abuse, prior history of hyponatremia with a sodium as low as 130 but never this low.  Asymptomatic. Patient does drink 2 to 3 cups of coffee per day and states she drinks about 4 glasses of water.  Few days ago she also took a dose of Lasix  due to feeling bloated.  Denies any nausea, or vomiting.  No diarrhea.  The history is provided by the patient. No language interpreter was used.       Home Medications Prior to Admission medications   Medication Sig Start Date End Date Taking? Authorizing Provider  acetaminophen  (TYLENOL ) 500 MG tablet Take 1,000 mg by mouth every 6 (six) hours as needed for moderate pain.    [provider]  albuterol  (VENTOLIN  HFA) 108 (90 Base) MCG/ACT inhaler INHALE 2 PUFFS INTO THE LUNGS EVERY 6 HOURS AS NEEDED FOR WHEEZING OR SHORTNESS OF BREATH 07/17/23   Wilfredo Hanly, MD  alendronate  (FOSAMAX ) 70 MG tablet TAKE 1 TABLET(70 MG) BY MOUTH 1 TIME A WEEK WITH A FULL GLASS OF WATER AND ON AN EMPTY STOMACH 07/10/23   Thapa, [place], MD  atorvastatin  (LIPITOR ) 80 MG tablet TAKE 1 TABLET(80 MG) BY MOUTH DAILY 11/27/18   Odie Benne, MD  bisoprolol  (ZEBETA ) 5 MG tablet Take 0.5 tablets (2.5 mg total) by mouth daily. 07/12/23   Sabharwal, Aditya, DO  Cholecalciferol (VITAMIN D3) 50 MCG (2000 UT) TABS Take 2,000 mcg by mouth daily.    [provider]  clopidogrel  (PLAVIX ) 75 MG tablet TAKE 1 TABLET(75 MG) BY MOUTH DAILY WITH BREAKFAST 11/27/23   Sabharwal, Aditya, DO  digoxin   (LANOXIN ) 0.125 MG tablet Take 0.5 tablets (0.0625 mg total) by mouth daily. 11/22/23   Elmarie Hacking, FNP  furosemide  (LASIX ) 20 MG tablet Take 1 tablet (20 mg total) by mouth as needed. 08/11/22   Sabharwal, Zenda Highman, DO  levothyroxine  (SYNTHROID ) 25 MCG tablet Take 37.5 mcg by mouth daily before breakfast. 07/10/23   Thapa, [place], MD  nitroGLYCERIN  (NITROSTAT ) 0.4 MG SL tablet Place 1 tablet (0.4 mg total) under the tongue every 5 (five) minutes x 3 doses as needed for chest pain. 03/27/23   Sabharwal, Aditya, DO  nystatin cream (MYCOSTATIN) Apply 1 Application topically 2 (two) times daily as needed (rash). 05/09/22   [provider]  ranolazine  (RANEXA ) 1000 MG SR tablet Take 1 tablet (1,000 mg total) by mouth 2 (two) times daily. 02/08/23   Odie Benne, MD  sacubitril-valsartan (ENTRESTO ) 24-26 MG Take 1 tablet by mouth 2 (two) times daily. 05/16/23   Sabharwal, Aditya, DO  spironolactone  (ALDACTONE ) 25 MG tablet TAKE 1 TABLET(25 MG) BY MOUTH DAILY 07/03/23   Sabharwal, Aditya, DO  umeclidinium-vilanterol (ANORO ELLIPTA ) 62.5-25 MCG/ACT AEPB INHALE 1 PUFF INTO THE LUNGS DAILY 11/24/23   Wilfredo Hanly, MD  valACYclovir  (VALTREX ) 1000 MG tablet Take 1,000 mg by mouth daily.    [provider]      Allergies    Lisinopril     Review of  Systems   Review of Systems  Constitutional:  Negative for chills and fever.  Respiratory:  Negative for shortness of breath.   Cardiovascular:  Negative for chest pain.  Gastrointestinal:  Negative for abdominal pain, nausea and vomiting.  All other systems reviewed and are negative.   Physical Exam Updated Vital Signs BP (!) 153/69   Pulse 65   Temp (!) 97.5 F (36.4 C) (Oral)   Resp 18   Ht 5\' 3"  (1.6 m)   Wt 49 kg   SpO2 98%   BMI 19.13 kg/m  Physical Exam Vitals and nursing note reviewed.  Constitutional:      General: She is not in acute distress.    Appearance: Normal appearance. She is not ill-appearing.   HENT:     Head: Normocephalic and atraumatic.     Nose: Nose normal.  Eyes:     Conjunctiva/sclera: Conjunctivae normal.  Cardiovascular:     Rate and Rhythm: Normal rate and regular rhythm.  Pulmonary:     Effort: Pulmonary effort is normal. No respiratory distress.  Abdominal:     General: There is no distension.     Palpations: Abdomen is soft.     Tenderness: There is no abdominal tenderness. There is no guarding.  Musculoskeletal:        General: No deformity. Normal range of motion.     Cervical back: Normal range of motion.  Skin:    Findings: No rash.  Neurological:     Mental Status: She is alert.     ED Results / Procedures / Treatments   Labs (all labs ordered are listed, but only abnormal results are displayed) Labs Reviewed  CBC WITH DIFFERENTIAL/PLATELET - Abnormal; Notable for the following components:      Result Value   MCH 35.4 (*)    All other components within normal limits  COMPREHENSIVE METABOLIC PANEL WITH GFR - Abnormal; Notable for the following components:   Sodium 127 (*)    Chloride 91 (*)    Glucose, Bld 102 (*)    Creatinine, Ser 1.02 (*)    Alkaline Phosphatase 26 (*)    GFR, Estimated 58 (*)    All other components within normal limits  URINALYSIS, ROUTINE W REFLEX MICROSCOPIC  OSMOLALITY, URINE  SODIUM, URINE, RANDOM  OSMOLALITY  BASIC METABOLIC PANEL WITH GFR    EKG None  Radiology No results found.  Procedures Procedures    Medications Ordered in ED Medications  sodium chloride  0.9 % bolus 500 mL (500 mLs Intravenous New Bag/Given 12/20/23 2050)    ED Course/ Medical Decision Making/ A&P                                 Medical Decision Making Amount and/or Complexity of Data Reviewed Labs: ordered.   Medical Decision Making / ED Course   This patient presents to the ED for concern of hyponatremia, this involves an extensive number of treatment options, and is a complaint that carries with it a high risk of  complications and morbidity.  The differential diagnosis includes fluid overload, dehydration, other source of hyponatremia, pseudohyponatremia  MDM: 73 year old female presents today for concern of hyponatremia with a sodium level of 124 outpatient.  Repeat in the emergency department was 127.  Patient is asymptomatic. CBC without leukocytosis or anemia.  CMP shows sodium of 127 otherwise without acute concern.  UA without evidence of UTI.  Discussed with hospitalist  who stated given the sodium level was 127 he recommends some urine studies as well as a serum osmolality, and fluid in the emergency department with a repeat metabolic panel.  If sodium does not improve or worsens then patient can be admitted otherwise patient can be discharged. Patient is in agreement with this plan. Will attempt this and reevaluate.  Repeat BMP shows sodium of 129. Patient is appropriate for discharge.  She will follow-up with PCP in the next 24-48 hours to have her sodium level rechecked.  Strict return precautions given.   Additional history obtained: -Additional history obtained from husband at bedside -External records from outside source obtained and reviewed including: Chart review including previous notes, labs, imaging, consultation notes   Lab Tests: -I ordered, reviewed, and interpreted labs.   The pertinent results include:   Labs Reviewed  CBC WITH DIFFERENTIAL/PLATELET - Abnormal; Notable for the following components:      Result Value   MCH 35.4 (*)    All other components within normal limits  COMPREHENSIVE METABOLIC PANEL WITH GFR - Abnormal; Notable for the following components:   Sodium 127 (*)    Chloride 91 (*)    Glucose, Bld 102 (*)    Creatinine, Ser 1.02 (*)    Alkaline Phosphatase 26 (*)    GFR, Estimated 58 (*)    All other components within normal limits  URINALYSIS, ROUTINE W REFLEX MICROSCOPIC  OSMOLALITY, URINE  SODIUM, URINE, RANDOM  OSMOLALITY  BASIC METABOLIC  PANEL WITH GFR      EKG  EKG Interpretation Date/Time:    Ventricular Rate:    PR Interval:    QRS Duration:    QT Interval:    QTC Calculation:   R Axis:      Text Interpretation:            Medicines ordered and prescription drug management: Meds ordered this encounter  Medications   sodium chloride  0.9 % bolus 500 mL    -I have reviewed the patients home medicines and have made adjustments as needed  Social Determinants of Health:  Factors impacting patients care include: Good outpatient follow-up, good family support   Reevaluation: After the interventions noted above, I reevaluated the patient and found that they have :improved  Co morbidities that complicate the patient evaluation  Past Medical History:  Diagnosis Date   Abnormal EKG    AICD (automatic cardioverter/defibrillator) present    Alcoholic intoxication without complication (HCC)    Allergy    Cardiomyopathy, ischemic    Cataract    Chest pain 08/29/2016   Chest pain 08/29/2016   CHF (congestive heart failure) (HCC)    Chronic systolic heart failure (HCC) 08/29/2016   Facial laceration    Fall 07/07/2016   Hyperlipidemia    Hypertension 03/03/2017   Hyponatremia    Hypothyroidism 07/08/2016   NSTEMI (non-ST elevated myocardial infarction) (HCC)    Syncope    Thyroid  disease       Dispostion: Discharged in stable condition.  Return precaution discussed.    Final Clinical Impression(s) / ED Diagnoses Final diagnoses:  Hyponatremia    Rx / DC Orders ED Discharge Orders     None         Lucina Sabal, PA-C 12/20/23 2301    Mozell Arias, MD 12/20/23 2312

## 2023-12-20 NOTE — Progress Notes (Deleted)
 No chief complaint on file.  History of Present Illness: 73 yo female with history of CAD, ischemic cardiomyopathy, chronic systolic CHF, HTN, HLD and hypothyroidism who is here today for cardiac follow up. She had been followed by Dr. Nolan Battle. I met her in November 2019. She was admitted to [facility] in December 2017 with a NSTEMI. She was found to have a chronic occlusion of the mid LAD and severe, calcified stenosis in the proximal Circumflex.  LVEF was 30% by LV gram and 35-40% by echo. Cardiac MRI December 2017 with non-viability of the anterior wall in the LAD territory. LVEF=38% by cardiac MRI. The Circumflex stenosis was treated with orbital atherectomy and stenting using a drug eluting stent in a staged procedure in January 2018. Echo November 2020 with LVEF=25-30%. Dilated LV. Mild MR. ICD placed in January 2021 and followed by Dr. Kin Penner. She was admitted to [facility] in September 2023 with acute on chronic CHF exacerbation. Cardiac cath September 2023 with stable CAD. Echo September 2023 with LVEF=25-30%. No significant valve disease. She is now followed in our Advanced Heart failure clinic by Dr. Bruce Caper. Right heart cath January 2024 with low right heart pressures but low cardiac output. She was most recently seen by Dr. Bruce Caper on 12/13/22 and did not wish to consider any advanced heart failure therapies. She has been followed in the Advanced Heart Failure clinic.   She is here today for follow up. The patient denies any chest pain, dyspnea, palpitations, lower extremity edema, orthopnea, PND, dizziness, near syncope or syncope.    Primary Care Physician: Lieutenant Reese., MD  Past Medical History:  Diagnosis Date   Abnormal EKG    AICD (automatic cardioverter/defibrillator) present    Alcoholic intoxication without complication [facility])    Allergy    Cardiomyopathy, ischemic    Cataract    Chest pain 08/29/2016   Chest pain 08/29/2016   CHF (congestive heart failure) (HCC)    Chronic  systolic heart failure (HCC) 08/29/2016   Facial laceration    Fall 07/07/2016   Hyperlipidemia    Hypertension 03/03/2017   Hyponatremia    Hypothyroidism 07/08/2016   NSTEMI (non-ST elevated myocardial infarction) (HCC)    Syncope    Thyroid  disease     Past Surgical History:  Procedure Laterality Date   CARDIAC CATHETERIZATION N/A 07/08/2016   Procedure: Left Heart Cath and Coronary Angiography;  Surgeon: Sammy Crisp, MD;  Location: [facility] INVASIVE CV LAB;  Service: Cardiovascular;  Laterality: N/A;   CARDIAC CATHETERIZATION N/A 08/29/2016   Procedure: Coronary Atherectomy;  Surgeon: Sammy Crisp, MD;  Location: [facility] INVASIVE CV LAB;  Service: Cardiovascular;  Laterality: N/A;   CARDIAC CATHETERIZATION N/A 08/29/2016   Procedure: Coronary Stent Intervention;  Surgeon: Sammy Crisp, MD;  Location: [facility] INVASIVE CV LAB;  Service: Cardiovascular;  Laterality: N/A;   CATARACT EXTRACTION, BILATERAL     COLONOSCOPY WITH PROPOFOL  N/A 11/18/2021   Procedure: COLONOSCOPY WITH PROPOFOL ;  Surgeon: Lindle Rhea, MD;  Location: [facility] ENDOSCOPY;  Service: Gastroenterology;  Laterality: N/A;   CORONARY ANGIOPLASTY     coronary stents      ICD IMPLANT N/A 08/28/2019   Procedure: ICD IMPLANT;  Surgeon: Lei Pump, MD;  Location: [facility] INVASIVE CV LAB;  Service: Cardiovascular;  Laterality: N/A;   LEFT HEART CATH AND CORONARY ANGIOGRAPHY N/A 04/29/2022   Procedure: LEFT HEART CATH AND CORONARY ANGIOGRAPHY;  Surgeon: Arleen Lacer, MD;  Location: [facility] INVASIVE CV LAB;  Service: Cardiovascular;  Laterality: N/A;   PARATHYROIDECTOMY  Right 02/02/2021   Procedure: RIGHT SUPERIOR PARATHYROIDECTOMY;  Surgeon: Oralee Billow, MD;  Location: [facility] ORS;  Service: General;  Laterality: Right;   POLYPECTOMY  11/18/2021   Procedure: POLYPECTOMY;  Surgeon: Lindle Rhea, MD;  Location: [facility] ENDOSCOPY;  Service: Gastroenterology;;   RIGHT HEART CATH N/A 08/11/2022   Procedure: RIGHT HEART CATH;  Surgeon:  Alwin Baars, DO;  Location: [facility] INVASIVE CV LAB;  Service: Cardiovascular;  Laterality: N/A;   VAGINAL HYSTERECTOMY      Current Outpatient Medications  Medication Sig Dispense Refill   acetaminophen  (TYLENOL ) 500 MG tablet Take 1,000 mg by mouth every 6 (six) hours as needed for moderate pain.     albuterol  (VENTOLIN  HFA) 108 (90 Base) MCG/ACT inhaler INHALE 2 PUFFS INTO THE LUNGS EVERY 6 HOURS AS NEEDED FOR WHEEZING OR SHORTNESS OF BREATH 6.7 g 0   alendronate  (FOSAMAX ) 70 MG tablet TAKE 1 TABLET(70 MG) BY MOUTH 1 TIME A WEEK WITH A FULL GLASS OF WATER AND ON AN EMPTY STOMACH 12 tablet 1   atorvastatin  (LIPITOR ) 80 MG tablet TAKE 1 TABLET(80 MG) BY MOUTH DAILY 90 tablet 1   bisoprolol  (ZEBETA ) 5 MG tablet Take 0.5 tablets (2.5 mg total) by mouth daily.     Cholecalciferol (VITAMIN D3) 50 MCG (2000 UT) TABS Take 2,000 mcg by mouth daily.     clopidogrel  (PLAVIX ) 75 MG tablet TAKE 1 TABLET(75 MG) BY MOUTH DAILY WITH BREAKFAST 90 tablet 2   digoxin  (LANOXIN ) 0.125 MG tablet Take 0.5 tablets (0.0625 mg total) by mouth daily. 45 tablet 3   furosemide  (LASIX ) 20 MG tablet Take 1 tablet (20 mg total) by mouth as needed. 30 tablet 10   levothyroxine  (SYNTHROID ) 25 MCG tablet Take 37.5 mcg by mouth daily before breakfast. 180 tablet 1   nitroGLYCERIN  (NITROSTAT ) 0.4 MG SL tablet Place 1 tablet (0.4 mg total) under the tongue every 5 (five) minutes x 3 doses as needed for chest pain. 75 tablet 3   nystatin cream (MYCOSTATIN) Apply 1 Application topically 2 (two) times daily as needed (rash).     ranolazine  (RANEXA ) 1000 MG SR tablet Take 1 tablet (1,000 mg total) by mouth 2 (two) times daily. 180 tablet 3   sacubitril-valsartan (ENTRESTO ) 24-26 MG Take 1 tablet by mouth 2 (two) times daily. 60 tablet 11   spironolactone  (ALDACTONE ) 25 MG tablet TAKE 1 TABLET(25 MG) BY MOUTH DAILY 90 tablet 3   umeclidinium-vilanterol (ANORO ELLIPTA ) 62.5-25 MCG/ACT AEPB INHALE 1 PUFF INTO THE LUNGS DAILY 60 each 0    valACYclovir  (VALTREX ) 1000 MG tablet Take 1,000 mg by mouth daily.     No current facility-administered medications for this visit.    Allergies  Allergen Reactions   Lisinopril  Cough    Dry cough    Social History   Socioeconomic History   Marital status: Married    Spouse name: TEFL teacher   Number of children: 2   Years of education: Not on file   Highest education level: High school graduate  Occupational History   Occupation: Retired-worked for post office  Tobacco Use   Smoking status: Former    Current packs/day: 0.00    Average packs/day: 1 pack/day for 50.0 years (50.0 ttl pk-yrs)    Types: Cigarettes    Start date: 07/07/1966    Quit date: 07/07/2016    Years since quitting: 7.4   Smokeless tobacco: Never  Vaping Use   Vaping status: Never Used  Substance and Sexual Activity   Alcohol use: Not Currently  Drug use: Never   Sexual activity: Not Currently  Other Topics Concern   Not on file  Social History Narrative   Not on file   Social Drivers of Health   Financial Resource Strain: Low Risk  (04/29/2022)   Overall Financial Resource Strain (CARDIA)    Difficulty of Paying Living Expenses: Not hard at all  Food Insecurity: Low Risk  (06/09/2023)   Received from [facility]   Hunger Vital Sign    Worried About Running Out of Food in the Last Year: Never true    Ran Out of Food in the Last Year: Never true  Transportation Needs: No Transportation Needs (06/09/2023)   Received from [facility]    In the past 12 months, has lack of reliable transportation kept you from medical appointments, meetings, work or from getting things needed for daily living? : No  Physical Activity: Not on file  Stress: Not on file  Social Connections: Not on file  Intimate Partner Violence: Not on file    Family History  Problem Relation Age of Onset   Kidney failure Mother    Heart disease Mother        CHF   Heart disease Sister    Colon cancer  Neg Hx    Esophageal cancer Neg Hx    Rectal cancer Neg Hx    Stomach cancer Neg Hx     Review of Systems:  As stated in the HPI and otherwise negative.   There were no vitals taken for this visit.  Physical Examination:  General: Well developed, well nourished, NAD  HEENT: OP clear, mucus membranes moist  SKIN: warm, dry. No rashes. Neuro: No focal deficits  Musculoskeletal: Muscle strength 5/5 all ext  Psychiatric: Mood and affect normal  Neck: No JVD, no carotid bruits, no thyromegaly, no lymphadenopathy.  Lungs:Clear bilaterally, no wheezes, rhonci, crackles Cardiovascular: Regular rate and rhythm. No murmurs, gallops or rubs. Abdomen:Soft. Bowel sounds present. Non-tender.  Extremities: No lower extremity edema. Pulses are 2 + in the bilateral DP/PT.  EKG:  EKG is *** ordered today. The ekg ordered today demonstrates   Echo September 2023:  1. Left ventricular ejection fraction, by estimation, is 25 to 30%. The  left ventricle has severely decreased function. The left ventricle  demonstrates regional wall motion abnormalities (see scoring  diagram/findings for description). Left ventricular  diastolic parameters are consistent with Grade II diastolic dysfunction  (pseudonormalization). Elevated left ventricular end-diastolic pressure.   2. Right ventricular systolic function is normal. The right ventricular  size is normal. There is mildly elevated pulmonary artery systolic  pressure.   3. Left atrial size was moderately dilated.   4. The mitral valve is normal in structure. Trivial mitral valve  regurgitation. No evidence of mitral stenosis.   5. The aortic valve is normal in structure. Aortic valve regurgitation is  not visualized. No aortic stenosis is present.   6. The inferior vena cava is normal in size with greater than 50%  respiratory variability, suggesting right atrial pressure of 3 mmHg.   Recent Labs: 11/22/2023: B Natriuretic Peptide 472.0 11/27/2023:  BUN 14; Creat 0.98; Potassium 5.0; Sodium 133; TSH 4.01   Lipid Panel    Component Value Date/Time   CHOL 153 03/01/2018 0801   TRIG 41 03/01/2018 0801   HDL 93 03/01/2018 0801   CHOLHDL 1.6 03/01/2018 0801   CHOLHDL 2.0 07/07/2016 1914   VLDL 17 07/07/2016 1914   LDLCALC 52 03/01/2018 0801  Wt Readings from Last 3 Encounters:  11/30/23 108 lb (49 kg)  11/22/23 108 lb 6.4 oz (49.2 kg)  09/18/23 110 lb 2 oz (50 kg)    Assessment and Plan:   1. CAD with stable angina: No chest pain. Will continue Plavix , Ranexa , statin and beta blocker.          2. Chronic systolic CHF/Ischemic cardiomyopathy: ICD in place. She is followed in our Advanced Heart Failure clinic by Dr. Bruce Caper. Low cardiac output by right heart cath January 2024. She has not wished to seek advanced therapies such as an LVAD. Continue beta blocker, Entresto , aldactone , Jardiance  and Lasix .      3. Hyperlipidemia: Lipids followed in primary care. Goal LDL under 55. Continue statin.   4. Tobacco abuse, in remission: She stopped smoking in 2017.   Labs/ tests ordered today include:  No orders of the defined types were placed in this encounter.  Disposition:   F/U with me in 12 months  Signed, Antoinette Batman, MD 12/20/2023 2:21 PM    [facility] [address] [place], [place], [place]  [address] Phone: [phone]; Fax: [phone]

## 2023-12-21 ENCOUNTER — Ambulatory Visit: Payer: Federal, State, Local not specified - PPO | Admitting: Cardiovascular Disease

## 2023-12-21 ENCOUNTER — Other Ambulatory Visit: Payer: Self-pay

## 2023-12-21 DIAGNOSIS — M810 Age-related osteoporosis without current pathological fracture: Secondary | ICD-10-CM

## 2023-12-21 MED ORDER — ALENDRONATE SODIUM 70 MG PO TABS: ORAL_TABLET | ORAL | 1 refills | Status: DC

## 2023-12-22 ENCOUNTER — Other Ambulatory Visit: Payer: Self-pay | Admitting: Pulmonary Disease

## 2023-12-22 DIAGNOSIS — J432 Centrilobular emphysema: Secondary | ICD-10-CM

## 2023-12-28 ENCOUNTER — Other Ambulatory Visit: Payer: Self-pay | Admitting: *Deleted

## 2023-12-28 ENCOUNTER — Ambulatory Visit: Admitting: Primary Care

## 2023-12-28 ENCOUNTER — Encounter: Payer: Self-pay | Admitting: Primary Care

## 2023-12-28 VITALS — BP 124/54 | HR 56 | Temp 97.7°F | Ht 63.0 in | Wt 111.4 lb

## 2023-12-28 DIAGNOSIS — Z87891 Personal history of nicotine dependence: Secondary | ICD-10-CM

## 2023-12-28 DIAGNOSIS — Z122 Encounter for screening for malignant neoplasm of respiratory organs: Secondary | ICD-10-CM

## 2023-12-28 DIAGNOSIS — J432 Centrilobular emphysema: Secondary | ICD-10-CM | POA: Diagnosis not present

## 2023-12-28 MED ORDER — UMECLIDINIUM-VILANTEROL 62.5-25 MCG/ACT IN AEPB: 1.0000 | INHALATION_SPRAY | Freq: Every day | RESPIRATORY_TRACT | 11 refills | Status: DC

## 2023-12-28 MED ORDER — ALBUTEROL SULFATE HFA 108 (90 BASE) MCG/ACT IN AERS: 2.0000 | INHALATION_SPRAY | Freq: Four times a day (QID) | RESPIRATORY_TRACT | 3 refills | Status: DC | PRN

## 2023-12-28 NOTE — Progress Notes (Signed)
 @Patient  ID: Pam [name], female    DOB: 11/11/1950, 73 y.o.   MRN: [id number]  Chief Complaint  Patient presents with   Follow-up    Med refill for Anoro and Albuterol . Pt developed a dry cough, complains this occurred 2-3 months. S.O.B (mild) with exertion     Referring provider: Lieutenant Reese.,*  HPI: 73 year old female, former smoker.  Past medical history significant for chronic systolic heart failure, coronary artery disease, hypertension, community-acquired pneumonia, acute respiratory failure with hypoxia, primary hyperparathyroidism, osteoporosis, hyperlipidemia.   12/28/2023   Discussed the use of AI scribe software for clinical note transcription with the patient, who gave verbal consent to proceed.  History of Present Illness   Pam [name] "Pam [name]" is a 73 year old female with emphysema who presents for a regular follow-up and medication refill.  She reports that her current medication, Anoro, is effective in reducing her cough and improving her breathing slightly. However, she continues to experience shortness of breath, especially during physical activity. She uses albuterol  as a rescue inhaler but does not require frequent refills, indicating infrequent use.  CTA in 2023 showed advanced emphysema with bronchial thickening. No suspicious nodules. She has not yet scheduled her lung screening for this year.  She has a significant smoking history, having smoked a pack and a half per day for approximately 40 years, starting at age 28 or 70 and quitting around age 40. She ceased smoking eight years ago, with some intermittent periods of cessation during her smoking years.      Pulmonary function testing 05/30/2022>> FVC 3.29 (114%, FEV1 1.77 (81%), ratio 54, TLC 124%, DLCOunc 8.01 (42%) mild obstructive airway disease, emphysematous type with reversible component.  Severely reduced diffusion capacity.  Allergies  Allergen Reactions   Lisinopril  Cough    Dry cough     Immunization History  Administered Date(s) Administered   PFIZER Comirnaty(Gray Top)Covid-19 Tri-Sucrose Vaccine 11/04/2020   PFIZER(Purple Top)SARS-COV-2 Vaccination 10/04/2019, 11/02/2019, 05/28/2020   Pfizer(Comirnaty)Fall Seasonal Vaccine 12 years and older 05/02/2023   Tdap 07/07/2016    Past Medical History:  Diagnosis Date   Abnormal EKG    AICD (automatic cardioverter/defibrillator) present    Alcoholic intoxication without complication (HCC)    Allergy    Cardiomyopathy, ischemic    Cataract    Chest pain 08/29/2016   Chest pain 08/29/2016   CHF (congestive heart failure) (HCC)    Chronic systolic heart failure (HCC) 08/29/2016   Facial laceration    Fall 07/07/2016   Hyperlipidemia    Hypertension 03/03/2017   Hyponatremia    Hypothyroidism 07/08/2016   NSTEMI (non-ST elevated myocardial infarction) (HCC)    Syncope    Thyroid  disease     Tobacco History: Social History   Tobacco Use  Smoking Status Former   Current packs/day: 0.00   Average packs/day: 1 pack/day for 50.0 years (50.0 ttl pk-yrs)   Types: Cigarettes   Start date: 07/07/1966   Quit date: 07/07/2016   Years since quitting: 7.4  Smokeless Tobacco Never   Counseling given: Not Answered   Outpatient Medications Prior to Visit  Medication Sig Dispense Refill   acetaminophen  (TYLENOL ) 500 MG tablet Take 1,000 mg by mouth every 6 (six) hours as needed for moderate pain.     albuterol  (VENTOLIN  HFA) 108 (90 Base) MCG/ACT inhaler INHALE 2 PUFFS INTO THE LUNGS EVERY 6 HOURS AS NEEDED FOR WHEEZING OR SHORTNESS OF BREATH 6.7 g 0   alendronate  (FOSAMAX ) 70 MG tablet TAKE 1 TABLET(70 MG) BY  MOUTH 1 TIME A WEEK WITH A FULL GLASS OF WATER AND ON AN EMPTY STOMACH 12 tablet 1   atorvastatin  (LIPITOR ) 80 MG tablet TAKE 1 TABLET(80 MG) BY MOUTH DAILY 90 tablet 1   bisoprolol  (ZEBETA ) 5 MG tablet Take 0.5 tablets (2.5 mg total) by mouth daily.     Cholecalciferol (VITAMIN D3) 50 MCG (2000 UT) TABS Take  2,000 mcg by mouth daily.     clopidogrel  (PLAVIX ) 75 MG tablet TAKE 1 TABLET(75 MG) BY MOUTH DAILY WITH BREAKFAST 90 tablet 2   digoxin  (LANOXIN ) 0.125 MG tablet Take 0.5 tablets (0.0625 mg total) by mouth daily. 45 tablet 3   furosemide  (LASIX ) 20 MG tablet Take 1 tablet (20 mg total) by mouth as needed. 30 tablet 10   levothyroxine  (SYNTHROID ) 25 MCG tablet Take 37.5 mcg by mouth daily before breakfast. 180 tablet 1   nitroGLYCERIN  (NITROSTAT ) 0.4 MG SL tablet Place 1 tablet (0.4 mg total) under the tongue every 5 (five) minutes x 3 doses as needed for chest pain. 75 tablet 3   nystatin cream (MYCOSTATIN) Apply 1 Application topically 2 (two) times daily as needed (rash).     ranolazine  (RANEXA ) 1000 MG SR tablet Take 1 tablet (1,000 mg total) by mouth 2 (two) times daily. 180 tablet 3   sacubitril-valsartan (ENTRESTO ) 24-26 MG Take 1 tablet by mouth 2 (two) times daily. 60 tablet 11   spironolactone  (ALDACTONE ) 25 MG tablet TAKE 1 TABLET(25 MG) BY MOUTH DAILY 90 tablet 3   umeclidinium-vilanterol (ANORO ELLIPTA ) 62.5-25 MCG/ACT AEPB INHALE 1 PUFF INTO THE LUNGS DAILY 60 each 0   valACYclovir  (VALTREX ) 1000 MG tablet Take 1,000 mg by mouth daily.     No facility-administered medications prior to visit.   Review of Systems  Review of Systems  Constitutional: Negative.   HENT: Negative.    Respiratory:  Positive for cough. Negative for shortness of breath and wheezing.   Cardiovascular: Negative.    Physical Exam  There were no vitals taken for this visit. Physical Exam Constitutional:      Appearance: Normal appearance. She is not ill-appearing.  HENT:     Head: Normocephalic and atraumatic.     Mouth/Throat:     Mouth: Mucous membranes are moist.     Pharynx: Oropharynx is clear.  Cardiovascular:     Rate and Rhythm: Normal rate and regular rhythm.  Pulmonary:     Effort: Pulmonary effort is normal.     Breath sounds: Normal breath sounds. No wheezing, rhonchi or rales.      Comments: Congested cough, np  Musculoskeletal:        General: Normal range of motion.  Skin:    General: Skin is warm and dry.  Neurological:     General: No focal deficit present.     Mental Status: She is alert and oriented to person, place, and time. Mental status is at baseline.  Psychiatric:        Mood and Affect: Mood normal.        Behavior: Behavior normal.        Thought Content: Thought content normal.        Judgment: Judgment normal.      Lab Results:  CBC    Component Value Date/Time   WBC 6.4 12/20/2023 1516   RBC 3.95 12/20/2023 1516   HGB 14.0 12/20/2023 1516   HGB 13.6 08/19/2019 0830   HCT 39.5 12/20/2023 1516   HCT 39.0 08/19/2019 0830   PLT 251  12/20/2023 1516   PLT 333 08/19/2019 0830   MCV 100.0 12/20/2023 1516   MCV 88 08/19/2019 0830   MCH 35.4 (H) 12/20/2023 1516   MCHC 35.4 12/20/2023 1516   RDW 14.2 12/20/2023 1516   RDW 14.1 08/19/2019 0830   LYMPHSABS 1.9 12/20/2023 1516   LYMPHSABS 2.9 08/25/2016 1648   MONOABS 0.7 12/20/2023 1516   EOSABS 0.0 12/20/2023 1516   EOSABS 0.2 08/25/2016 1648   BASOSABS 0.0 12/20/2023 1516   BASOSABS 0.0 08/25/2016 1648    BMET    Component Value Date/Time   NA 129 (L) 12/20/2023 2200   NA 131 (L) 08/19/2019 0830   K 3.8 12/20/2023 2200   CL 96 (L) 12/20/2023 2200   CO2 24 12/20/2023 2200   GLUCOSE 112 (H) 12/20/2023 2200   BUN 12 12/20/2023 2200   BUN 10 08/19/2019 0830   CREATININE 0.92 12/20/2023 2200   CREATININE 0.98 11/27/2023 0827   CALCIUM  9.1 12/20/2023 2200   GFRNONAA >60 12/20/2023 2200   GFRAA 78 08/19/2019 0830    BNP    Component Value Date/Time   BNP 472.0 (H) 11/22/2023 1129    ProBNP    Component Value Date/Time   PROBNP 774 (H) 09/13/2017 1042    Imaging: No results found.   Assessment & Plan:   1. Centrilobular emphysema (HCC) - umeclidinium-vilanterol (ANORO ELLIPTA ) 62.5-25 MCG/ACT AEPB; Inhale 1 puff into the lungs daily.  Dispense: 60 each; Refill:  11 - albuterol  (VENTOLIN  HFA) 108 (90 Base) MCG/ACT inhaler; Inhale 2 puffs into the lungs every 6 (six) hours as needed for wheezing or shortness of breath.  Dispense: 6.7 g; Refill: 3  2. Former smoker Scientist, physiological) - Ambulatory Referral for Lung Cancer Scre  Assessment and Plan    Emphysema Chronic emphysema well-managed with Anoro inhaler. Reports symptom improvement with daily Anoro use, including reduced cough and improved breathing. Experiences exertional dyspnea, managed with albuterol  as needed.  - Continue Anoro inhaler once daily. Refills provided  - Prescribe albuterol  inhaler for breakthrough wheezing or dyspnea. - Advise Mucinex  as needed for loose productive cough. - Follow up in 1 year or sooner if needed   Tobacco use Significant tobacco use history, smoking 1.5 packs per day for approximately 40 years. Quit smoking 8 years ago. - Previously following with [facility] for lung cancer screening, missed annual scan in 2024. Refer to [facility]   Antonio Baumgarten, NP 12/28/2023

## 2023-12-28 NOTE — Patient Instructions (Signed)
 -  EMPHYSEMA: Emphysema is a lung condition that causes shortness of breath due to damage to the air sacs in the lungs. You will continue using the Anoro inhaler once daily to manage your symptoms. For any breakthrough wheezing or shortness of breath, you should use the albuterol  inhaler as needed. Additionally, you can take Mucinex  as needed for your loose productive cough. We will also schedule a lung cancer screening CT scan at [facility].  -TOBACCO USE: You have a significant history of smoking, having smoked 1.5 packs per day for about 40 years before quitting 8 years ago. No new treatment is needed for this issue, but it is important to continue avoiding tobacco to help manage your emphysema.  INSTRUCTIONS: Please schedule your lung cancer screening CT scan at [facility]. Continue using your Anoro inhaler daily and your albuterol  inhaler as needed. Take Mucinex  as needed for your cough. Follow up with us  if you have any new or worsening symptoms.  Rx: Refill Anoro and Albuterol   Orders: Refer to lung cancer screening program with [facility]  Follow-up 1 year with Dr. Diania Fortes

## 2023-12-29 ENCOUNTER — Telehealth: Payer: Self-pay | Admitting: *Deleted

## 2023-12-29 NOTE — Telephone Encounter (Signed)
 Lung Cancer Screening Narrative/Criteria Questionnaire (Cigarette Smokers Only- No Cigars/Pipes/vapes)   Pam [name]   SDMV:01/15/24 9:15- Acie Holiday                                           31-Jan-1951              LDCT: 01/16/24 9:40- GI    72 y.o.   Phone: [phone]  Lung Screening Narrative (confirm age 54-77 yrs Medicare / 50-80 yrs Private pay insurance)   Insurance information:BCBS   Referring Provider:[facility]   This screening involves an initial phone call with a team member from our program. It is called a shared decision making visit. The initial meeting is required by insurance and Medicare to make sure you understand the program. This appointment takes about 15-20 minutes to complete. The CT scan will completed at a separate date/time. This scan takes about 5-10 minutes to complete and you may eat and drink before and after the scan.  Criteria questions for Lung Cancer Screening:   Are you a current or former smoker? Former Age began smoking: 21   If you are a former smoker, what year did you quit smoking? 2017(within 15 yrs)   To calculate your smoking history, I need an accurate estimate of how many packs of cigarettes you smoked per day and for how many years. (Not just the number of PPD you are now smoking)   Years smoking 37 x Packs per day 1-1.5 = Pack years 47   (at least 20 pack yrs)   (Make sure they understand that we need to know how much they have smoked in the past, not just the number of PPD they are smoking now)  Do you have a personal history of cancer?  No    Do you have a family history of cancer? No  Are you coughing up blood?  No  Have you had unexplained weight loss of 15 lbs or more in the last 6 months? No  It looks like you meet all criteria.     Additional information: N/A

## 2024-01-04 NOTE — Addendum Note (Signed)
 Addended by: Lott Rouleau A on: 01/04/2024 03:17 PM   Modules accepted: Orders

## 2024-01-04 NOTE — Progress Notes (Signed)
 Remote ICD transmission.

## 2024-01-15 ENCOUNTER — Encounter: Payer: Self-pay | Admitting: Adult Health

## 2024-01-15 ENCOUNTER — Ambulatory Visit (INDEPENDENT_AMBULATORY_CARE_PROVIDER_SITE_OTHER): Admitting: Adult Health

## 2024-01-15 DIAGNOSIS — Z87891 Personal history of nicotine dependence: Secondary | ICD-10-CM

## 2024-01-15 NOTE — Progress Notes (Signed)
  Virtual Visit via Telephone Note  I connected with Pam [name] , 01/15/24 9:22 AM by a telemedicine application and verified that I am speaking with the correct person using two identifiers.  Location: Patient: home Provider: home   I discussed the limitations of evaluation and management by telemedicine and the availability of in person appointments. The patient expressed understanding and agreed to proceed.   Shared Decision Making Visit Lung Cancer Screening Program [phone])   Eligibility: 73 y.o. Pack Years Smoking History Calculation = 47 pack years  (# packs/per year x # years smoked) Recent History of coughing up blood  no Unexplained weight loss? no ( >Than 15 pounds within the last 6 months ) Prior History Lung / other cancer no (Diagnosis within the last 5 years already requiring surveillance chest CT Scans). Smoking Status Former Smoker Former Smokers: Years since quit: 8 years  Quit Date: 2017  Visit Components: Discussion included one or more decision making aids. YES Discussion included risk/benefits of screening. YES Discussion included potential follow up diagnostic testing for abnormal scans. YES Discussion included meaning and risk of over diagnosis. YES Discussion included meaning and risk of False Positives. YES Discussion included meaning of total radiation exposure. YES  Counseling Included: Importance of adherence to annual lung cancer LDCT screening. YES Impact of comorbidities on ability to participate in the program. YES Ability and willingness to under diagnostic treatment. YES  Smoking Cessation Counseling: Former Smokers:  Discussed the importance of maintaining cigarette abstinence. yes Diagnosis Code: Personal History of Nicotine Dependence. U04.540 Information about tobacco cessation classes and interventions provided to patient. Yes Patient provided with ticket for LDCT Scan. yes Written Order for Lung Cancer Screening with LDCT  placed in Epic. Yes (CT Chest Lung Cancer Screening Low Dose W/O CM) [id number]  Z12.2-Screening of respiratory organs Z87.891-Personal history of nicotine dependence   Cullen Dose 01/15/24

## 2024-01-15 NOTE — Patient Instructions (Signed)

## 2024-01-16 ENCOUNTER — Ambulatory Visit
Admission: RE | Admit: 2024-01-16 | Discharge: 2024-01-16 | Disposition: A | Discharge status: Home / Self Care | Source: Ambulatory Visit | Attending: Acute Care | Admitting: Acute Care

## 2024-01-16 DIAGNOSIS — Z87891 Personal history of nicotine dependence: Secondary | ICD-10-CM

## 2024-01-16 DIAGNOSIS — Z122 Encounter for screening for malignant neoplasm of respiratory organs: Secondary | ICD-10-CM

## 2024-01-29 ENCOUNTER — Other Ambulatory Visit: Payer: Self-pay | Admitting: Acute Care

## 2024-01-29 DIAGNOSIS — Z87891 Personal history of nicotine dependence: Secondary | ICD-10-CM

## 2024-01-29 DIAGNOSIS — Z122 Encounter for screening for malignant neoplasm of respiratory organs: Secondary | ICD-10-CM

## 2024-02-07 ENCOUNTER — Other Ambulatory Visit: Payer: Self-pay | Admitting: Cardiovascular Disease

## 2024-02-07 DIAGNOSIS — I5022 Chronic systolic (congestive) heart failure: Secondary | ICD-10-CM

## 2024-02-20 ENCOUNTER — Ambulatory Visit: Payer: Federal, State, Local not specified - PPO

## 2024-02-20 DIAGNOSIS — I255 Ischemic cardiomyopathy: Secondary | ICD-10-CM | POA: Diagnosis not present

## 2024-02-21 LAB — CUP PACEART REMOTE DEVICE CHECK
Battery Remaining Longevity: 95 mo
Battery Voltage: 3 V
Brady Statistic RV Percent Paced: 0.01 %
Date Time Interrogation Session: 20250722033324
HighPow Impedance: 55 Ohm
Implantable Lead Connection Status: 753985
Implantable Lead Implant Date: 20210127
Implantable Lead Location: 753860
Implantable Pulse Generator Implant Date: 20210127
Lead Channel Impedance Value: 304 Ohm
Lead Channel Impedance Value: 399 Ohm
Lead Channel Pacing Threshold Amplitude: 0.625 V
Lead Channel Pacing Threshold Pulse Width: 0.4 ms
Lead Channel Sensing Intrinsic Amplitude: 8.5 mV
Lead Channel Sensing Intrinsic Amplitude: 8.5 mV
Lead Channel Setting Pacing Amplitude: 2 V
Lead Channel Setting Pacing Pulse Width: 0.4 ms
Lead Channel Setting Sensing Sensitivity: 0.3 mV
Zone Setting Status: 755011
Zone Setting Status: 755011

## 2024-02-24 ENCOUNTER — Other Ambulatory Visit (HOSPITAL_COMMUNITY): Payer: Self-pay | Admitting: Cardiology

## 2024-02-26 ENCOUNTER — Ambulatory Visit: Payer: Self-pay | Admitting: Cardiology

## 2024-03-05 ENCOUNTER — Ambulatory Visit (INDEPENDENT_AMBULATORY_CARE_PROVIDER_SITE_OTHER)
Admission: RE | Admit: 2024-03-05 | Discharge: 2024-03-05 | Disposition: A | Discharge status: Home / Self Care | Source: Ambulatory Visit | Attending: Endocrinology

## 2024-03-05 DIAGNOSIS — M810 Age-related osteoporosis without current pathological fracture: Secondary | ICD-10-CM | POA: Diagnosis not present

## 2024-03-08 ENCOUNTER — Ambulatory Visit: Payer: Self-pay | Admitting: Endocrinology

## 2024-03-13 ENCOUNTER — Other Ambulatory Visit (HOSPITAL_COMMUNITY): Payer: Self-pay

## 2024-03-13 MED ORDER — FUROSEMIDE 20 MG PO TABS
20.0000 mg | ORAL_TABLET | Freq: Every day | ORAL | 10 refills | Status: AC | PRN
Start: 2024-03-13 — End: ?

## 2024-03-17 NOTE — Progress Notes (Unsigned)
  Electrophysiology Office Note:   ID:  Pam [name], DOB [birth year], MRN [id number]  Primary Cardiologist: Lonni Cash, MD Electrophysiologist: Will Gladis Norton, MD  {Click to update primary MD,subspecialty MD or APP then REFRESH:1}    History of Present Illness:   Pam [name] is a 73 y.o. female with h/o CHF s/p ICD, HTN, and CAD seen today for routine electrophysiology followup.   Since last being seen in our clinic the patient reports doing ***.  she denies chest pain, palpitations, dyspnea, PND, orthopnea, nausea, vomiting, dizziness, syncope, edema, weight gain, or early satiety.   Review of systems complete and found to be negative unless listed in HPI.   EP Information / Studies Reviewed:    EKG is not ordered today. EKG from 11/22/2023 reviewed which showed sinus brady at 54 bpm       ICD Interrogation-  reviewed in detail today,  See PACEART report.  Arrhythmia/Device History CARELINK  ICD MEDTRONIC,   Dr. Gardenia   Physical Exam:   VS:  There were no vitals taken for this visit.   Wt Readings from Last 3 Encounters:  12/28/23 111 lb 6.4 oz (50.5 kg)  12/20/23 108 lb (49 kg)  11/30/23 108 lb (49 kg)     GEN: No acute distress *** NECK: No JVD; No carotid bruits CARDIAC: {EPRHYTHM:28826}, no murmurs, rubs, gallops RESPIRATORY:  Clear to auscultation without rales, wheezing or rhonchi  ABDOMEN: Soft, non-tender, non-distended EXTREMITIES:  {EDEMA LEVEL:28147::No} edema; No deformity   ASSESSMENT AND PLAN:    Chronic systolic CHF  s/p Medtronic single chamber ICD  euvolemic today Stable on an appropriate medical regimen Normal ICD function See Pace Art report No changes today  CAD No s/s of ischemia.      HTN Stable on current regimen   Disposition:   Follow up with {EPPROVIDERS:28135::EP Team} {EPFOLLOW UP:28173}   Signed, Ozell Prentice Passey, PA-C

## 2024-03-18 ENCOUNTER — Encounter: Payer: Self-pay | Admitting: Student

## 2024-03-18 ENCOUNTER — Ambulatory Visit: Attending: Student | Admitting: Student

## 2024-03-18 VITALS — BP 132/60 | HR 95 | Ht 63.0 in | Wt 110.0 lb

## 2024-03-18 DIAGNOSIS — I5020 Unspecified systolic (congestive) heart failure: Secondary | ICD-10-CM | POA: Diagnosis not present

## 2024-03-18 DIAGNOSIS — I10 Essential (primary) hypertension: Secondary | ICD-10-CM | POA: Diagnosis not present

## 2024-03-18 DIAGNOSIS — I2510 Atherosclerotic heart disease of native coronary artery without angina pectoris: Secondary | ICD-10-CM

## 2024-03-18 LAB — CUP PACEART INCLINIC DEVICE CHECK
Battery Remaining Longevity: 96 mo
Battery Voltage: 3 V
Brady Statistic RV Percent Paced: 0.01 %
Date Time Interrogation Session: 20250818095139
HighPow Impedance: 58 Ohm
Implantable Lead Connection Status: 753985
Implantable Lead Implant Date: 20210127
Implantable Lead Location: 753860
Implantable Pulse Generator Implant Date: 20210127
Lead Channel Impedance Value: 323 Ohm
Lead Channel Impedance Value: 399 Ohm
Lead Channel Pacing Threshold Amplitude: 0.5 V
Lead Channel Pacing Threshold Pulse Width: 0.4 ms
Lead Channel Sensing Intrinsic Amplitude: 8.125 mV
Lead Channel Sensing Intrinsic Amplitude: 8.625 mV
Lead Channel Setting Pacing Amplitude: 2 V
Lead Channel Setting Pacing Pulse Width: 0.4 ms
Lead Channel Setting Sensing Sensitivity: 0.3 mV
Zone Setting Status: 755011
Zone Setting Status: 755011

## 2024-03-18 NOTE — Patient Instructions (Signed)
 Medication Instructions:  Your physician recommends that you continue on your current medications as directed. Please refer to the Current Medication list given to you today.  *If you need a refill on your cardiac medications before your next appointment, please call your pharmacy*  Lab Work: None ordered If you have labs (blood work) drawn today and your tests are completely normal, you will receive your results only by: MyChart Message (if you have MyChart) OR A paper copy in the mail If you have any lab test that is abnormal or we need to change your treatment, we will call you to review the results.  Follow-Up: At [facility], you and your health needs are our priority.  As part of our continuing mission to provide you with exceptional heart care, our providers are all part of one team.  This team includes your primary Cardiologist (physician) and Advanced Practice Providers or APPs (Physician Assistants and Nurse Practitioners) who all work together to provide you with the care you need, when you need it.  Your next appointment:   1 year(s)  Provider:   You may see Will Cortland Ding, MD or one of the following Advanced Practice Providers on your designated Care Team:   Mertha Abrahams, PA-C Michael Andy Tillery, PA-C Suzann Riddle, NP Creighton Doffing, NP

## 2024-03-21 ENCOUNTER — Ambulatory Visit: Payer: Self-pay | Admitting: Cardiology

## 2024-03-25 ENCOUNTER — Ambulatory Visit: Attending: Cardiovascular Disease | Admitting: Cardiovascular Disease

## 2024-03-25 VITALS — BP 112/52 | HR 53 | Ht 63.0 in | Wt 105.8 lb

## 2024-03-25 DIAGNOSIS — I255 Ischemic cardiomyopathy: Secondary | ICD-10-CM

## 2024-03-25 DIAGNOSIS — M79605 Pain in left leg: Secondary | ICD-10-CM

## 2024-03-25 DIAGNOSIS — I25119 Atherosclerotic heart disease of native coronary artery with unspecified angina pectoris: Secondary | ICD-10-CM | POA: Diagnosis not present

## 2024-03-25 DIAGNOSIS — I5022 Chronic systolic (congestive) heart failure: Secondary | ICD-10-CM | POA: Diagnosis not present

## 2024-03-25 DIAGNOSIS — M79604 Pain in right leg: Secondary | ICD-10-CM

## 2024-03-25 DIAGNOSIS — E785 Hyperlipidemia, unspecified: Secondary | ICD-10-CM

## 2024-03-25 NOTE — Progress Notes (Signed)
 Chief Complaint  Patient presents with   Follow-up    CAD   History of Present Illness: 73 yo female with history of CAD, ischemic cardiomyopathy, chronic systolic CHF, HTN, HLD and hypothyroidism who is here today for cardiac follow up. She had been followed by Dr. Mady. I met her in November 2019. She was admitted to [facility] in December 2017 with a NSTEMI. She was found to have a chronic occlusion of the mid LAD and severe, calcified stenosis in the proximal Circumflex.  LVEF was 30% by LV gram and 35-40% by echo. Cardiac MRI December 2017 with non-viability of the anterior wall in the LAD territory. LVEF=38% by cardiac MRI. The Circumflex stenosis was treated with orbital atherectomy and stenting using a drug eluting stent in a staged procedure in January 2018. Echo November 2020 with LVEF=25-30%. Dilated LV. Mild MR. ICD placed in January 2021 and followed by Dr. Antonetta. She was admitted to [facility] in September 2023 with acute on chronic CHF exacerbation. Cardiac cath September 2023 with stable CAD. Echo September 2023 with LVEF=25-30%. No significant valve disease. She is now followed in our Advanced Heart failure clinic by Dr. Gardenia. Right heart cath January 2024 with low right heart pressures but low cardiac output. She was seen in the Advanced Heart Failure clinic in April 2025. She does not wish to consider any advanced heart failure therapies. Jardiance  has been stopped.   She is here today for follow up. She has rare chest pain that is responsive to SL NTG. She has baseline dyspnea. She uses Lasix  several days per week. She denies palpitations, lower extremity edema, orthopnea, PND, dizziness, near syncope or syncope. She does endorse bilateral leg pain with ambulation and associated leg weakness/tingling.    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 [facility])    Allergy    Cardiomyopathy, ischemic    Cataract    Chest pain 08/29/2016   Chest pain 08/29/2016   CHF (congestive heart failure) (HCC)    Chronic systolic heart failure (HCC) 08/29/2016   Facial laceration    Fall 07/07/2016   Hyperlipidemia    Hypertension 03/03/2017   Hyponatremia    Hypothyroidism 07/08/2016   NSTEMI (non-ST elevated myocardial infarction) (HCC)    Syncope    Thyroid  disease     Past Surgical History:  Procedure Laterality Date   CARDIAC CATHETERIZATION N/A 07/08/2016   Procedure: Left Heart Cath and Coronary Angiography;  Surgeon: Lonni Mady, MD;  [facility] INVASIVE CV LAB;  Service: Cardiovascular;  Laterality: N/A;   CARDIAC CATHETERIZATION N/A 08/29/2016   Procedure: Coronary Atherectomy;  Surgeon: Lonni Mady, MD;  Location: [facility] INVASIVE CV LAB;  Service: Cardiovascular;  Laterality: N/A;   CARDIAC CATHETERIZATION N/A 08/29/2016   Procedure: Coronary Stent Intervention;  Surgeon: Lonni Mady, MD;  Location: [facility] INVASIVE CV LAB;  Service: Cardiovascular;  Laterality: N/A;   CATARACT EXTRACTION, BILATERAL     COLONOSCOPY WITH PROPOFOL  N/A 11/18/2021   Procedure: COLONOSCOPY WITH PROPOFOL ;  Surgeon: Eda Iha, MD;  Location: [facility] ENDOSCOPY;  Service: Gastroenterology;  Laterality: N/A;   CORONARY ANGIOPLASTY     coronary stents      ICD IMPLANT N/A 08/28/2019   Procedure: ICD IMPLANT;  Surgeon: Inocencio Soyla Lunger, MD;  Location: [facility] INVASIVE CV LAB;  Service: Cardiovascular;  Laterality: N/A;   LEFT HEART CATH AND  CORONARY ANGIOGRAPHY N/A 04/29/2022   Procedure: LEFT HEART CATH AND CORONARY ANGIOGRAPHY;  Surgeon: Anner Alm ORN, MD;  Location: [facility] INVASIVE CV LAB;  Service: Cardiovascular;  Laterality: N/A;   PARATHYROIDECTOMY Right 02/02/2021   Procedure: RIGHT SUPERIOR PARATHYROIDECTOMY;  Surgeon: Eletha Boas, MD;  Location: [facility] ORS;  Service: General;  Laterality: Right;   POLYPECTOMY   11/18/2021   Procedure: POLYPECTOMY;  Surgeon: Eda Iha, MD;  Location: [facility] ENDOSCOPY;  Service: Gastroenterology;;   RIGHT HEART CATH N/A 08/11/2022   Procedure: RIGHT HEART CATH;  Surgeon: Gardenia Led, DO;  Location: [facility] INVASIVE CV LAB;  Service: Cardiovascular;  Laterality: N/A;   VAGINAL HYSTERECTOMY      Current Outpatient Medications  Medication Sig Dispense Refill   acetaminophen  (TYLENOL ) 500 MG tablet Take 1,000 mg by mouth every 6 (six) hours as needed for moderate pain.     albuterol  (VENTOLIN  HFA) 108 (90 Base) MCG/ACT inhaler Inhale 2 puffs into the lungs every 6 (six) hours as needed for wheezing or shortness of breath. 6.7 g 3   alendronate  (FOSAMAX ) 70 MG tablet TAKE 1 TABLET(70 MG) BY MOUTH 1 TIME A WEEK WITH A FULL GLASS OF WATER AND ON AN EMPTY STOMACH 12 tablet 1   atorvastatin  (LIPITOR ) 80 MG tablet TAKE 1 TABLET(80 MG) BY MOUTH DAILY 90 tablet 1   bisoprolol  (ZEBETA ) 5 MG tablet Take 0.5 tablets (2.5 mg total) by mouth daily.     Cholecalciferol (VITAMIN D3) 50 MCG (2000 UT) TABS Take 2,000 mcg by mouth daily.     clopidogrel  (PLAVIX ) 75 MG tablet TAKE 1 TABLET(75 MG) BY MOUTH DAILY WITH BREAKFAST 90 tablet 2   digoxin  (LANOXIN ) 0.125 MG tablet Take 0.5 tablets (0.0625 mg total) by mouth daily. 45 tablet 3   furosemide  (LASIX ) 20 MG tablet TAKE 1 TABLET BY MOUTH AS NEEDED 30 tablet 10   levothyroxine  (SYNTHROID ) 25 MCG tablet Take 37.5 mcg by mouth daily before breakfast. 180 tablet 1   nitroGLYCERIN  (NITROSTAT ) 0.4 MG SL tablet Place 1 tablet (0.4 mg total) under the tongue every 5 (five) minutes x 3 doses as needed for chest pain. 75 tablet 3   nystatin cream (MYCOSTATIN) Apply 1 Application topically 2 (two) times daily as needed (rash).     ranolazine  (RANEXA ) 1000 MG SR tablet TAKE 1 TABLET(1000 MG) BY MOUTH TWICE DAILY 180 tablet 3   sacubitril-valsartan (ENTRESTO ) 24-26 MG Take 1 tablet by mouth 2 (two) times daily. 60 tablet 11   spironolactone   (ALDACTONE ) 25 MG tablet TAKE 1 TABLET(25 MG) BY MOUTH DAILY 90 tablet 3   umeclidinium-vilanterol (ANORO ELLIPTA ) 62.5-25 MCG/ACT AEPB Inhale 1 puff into the lungs daily. 60 each 11   valACYclovir  (VALTREX ) 1000 MG tablet Take 1,000 mg by mouth daily.     No current facility-administered medications for this visit.    Allergies  Allergen Reactions   Lisinopril  Cough    Dry cough    Social History   Socioeconomic History   Marital status: Married    Spouse name: Nickalos   Number of children: 2   Years of education: Not on file   Highest education level: High school graduate  Occupational History   Occupation: Retired-worked for post office  Tobacco Use   Smoking status: Former    Current packs/day: 0.00    Average packs/day: 1 pack/day for 50.0 years (50.0 ttl pk-yrs)    Types: Cigarettes    Start date: 07/07/1966    Quit date: 07/07/2016    Years  since quitting: 7.7   Smokeless tobacco: Never  Vaping Use   Vaping status: Never Used  Substance and Sexual Activity   Alcohol use: Not Currently   Drug use: Never   Sexual activity: Not Currently  Other Topics Concern   Not on file  Social History Narrative   Not on file   Social Drivers of Health   Financial Resource Strain: Low Risk  (04/29/2022)   Overall Financial Resource Strain (CARDIA)    Difficulty of Paying Living Expenses: Not hard at all  Food Insecurity: Low Risk  (06/09/2023)   Received from [facility]   Hunger Vital Sign    Within the past 12 months, you worried that your food would run out before you got money to buy more: Never true    Within the past 12 months, the food you bought just didn't last and you didn't have money to get more. : Never true  Transportation Needs: No Transportation Needs (06/09/2023)   Received from [facility]    In the past 12 months, has lack of reliable transportation kept you from medical appointments, meetings, work or from getting things needed for  daily living? : No  Physical Activity: Not on file  Stress: Not on file  Social Connections: Not on file  Intimate Partner Violence: Not on file    Family History  Problem Relation Age of Onset   Kidney failure Mother    Heart disease Mother        CHF   Heart disease Sister    Colon cancer Neg Hx    Esophageal cancer Neg Hx    Rectal cancer Neg Hx    Stomach cancer Neg Hx     Review of Systems:  As stated in the HPI and otherwise negative.   BP (!) 112/52   Pulse (!) 53   Ht 5' 3 (1.6 m)   Wt 105 lb 12.8 oz (48 kg)   SpO2 95%   BMI 18.74 kg/m   Physical Examination:  General: Well developed, well nourished, NAD  HEENT: OP clear, mucus membranes moist  SKIN: warm, dry. No rashes. Neuro: No focal deficits  Musculoskeletal: Muscle strength 5/5 all ext  Psychiatric: Mood and affect normal  Neck: No JVD, no carotid bruits, no thyromegaly, no lymphadenopathy.  Lungs:Clear bilaterally, no wheezes, rhonci, crackles Cardiovascular: Regular rate and rhythm. No murmurs, gallops or rubs. Abdomen:Soft. Bowel sounds present. Non-tender.  Extremities: No lower extremity edema.   EKG:  EKG is not ordered today. The ekg ordered today demonstrates   Recent Labs: 11/22/2023: B Natriuretic Peptide 472.0 11/27/2023: TSH 4.01 12/20/2023: ALT 15; BUN 12; Creatinine, Ser 0.92; Hemoglobin 14.0; Platelets 251; Potassium 3.8; Sodium 129   Lipid Panel    Component Value Date/Time   CHOL 153 03/01/2018 0801   TRIG 41 03/01/2018 0801   HDL 93 03/01/2018 0801   CHOLHDL 1.6 03/01/2018 0801   CHOLHDL 2.0 07/07/2016 1914   VLDL 17 07/07/2016 1914   LDLCALC 52 03/01/2018 0801     Wt Readings from Last 3 Encounters:  03/25/24 105 lb 12.8 oz (48 kg)  03/18/24 110 lb (49.9 kg)  12/28/23 111 lb 6.4 oz (50.5 kg)    Assessment and Plan:   1. CAD with stable angina: No change in her angina. Rare chest pains responsive to SL NTG. Will continue Plavix , beta blocker, statin and Ranexa .           2. Chronic systolic CHF/Ischemic cardiomyopathy: ICD  in place. She is followed in our Advanced Heart Failure clinic by Dr. Gardenia. Low cardiac output by right heart cath January 2024. She has not wished to seek advanced therapies such as an LVAD. Continue beta blocker, Entresto , aldactone  and Lasix .      3. Hyperlipidemia: Lipids followed in primary care. Continue statin  4. Tobacco abuse, in remission: She stopped smoking in 2017.   5. Claudication: Symptoms worse with ambulation. Suspicious for PAD. Will arrange ABI/arterial dopplers.   Labs/ tests ordered today include:   Orders Placed This Encounter  Procedures   VAS US  ABI WITH/WO TBI   VAS US  LOWER EXTREMITY ARTERIAL DUPLEX   Disposition:   F/U with me in 12 months  Signed, Lonni Cash, MD 03/25/2024 3:30 PM    [facility] [address] [place], [place], [place]  [address] Phone: [phone]; Fax: [phone]

## 2024-03-25 NOTE — Patient Instructions (Signed)
 Medication Instructions:  No changes *If you need a refill on your cardiac medications before your next appointment, please call your pharmacy*  Lab Work: none  Testing/Procedures: Your physician has requested that you have an ankle brachial index (ABI). During this test an ultrasound and blood pressure cuff are used to evaluate the arteries that supply the arms and legs with blood. Allow thirty minutes for this exam. There are no restrictions or special instructions.  Your physician has requested that you have a lower extremity arterial duplex. This test is an ultrasound of the arteries in the legs. It looks at arterial blood flow in the legs. Allow one hour for Lower Arterial scans. There are no restrictions or special instructions.  Please note: We ask at that you not bring children with you during ultrasound (echo/ vascular) testing. Due to room size and safety concerns, children are not allowed in the ultrasound rooms during exams. Our front office staff cannot provide observation of children in our lobby area while testing is being conducted. An adult accompanying a patient to their appointment will only be allowed in the ultrasound room at the discretion of the ultrasound technician under special circumstances. We apologize for any inconvenience.   Follow-Up: At [facility], you and your health needs are our priority.  As part of our continuing mission to provide you with exceptional heart care, our providers are all part of one team.  This team includes your primary Cardiologist (physician) and Advanced Practice Providers or APPs (Physician Assistants and Nurse Practitioners) who all work together to provide you with the care you need, when you need it.  Your next appointment:   12 month(s)  Provider:   Lonni Cash, MD

## 2024-03-26 ENCOUNTER — Encounter (HOSPITAL_COMMUNITY): Payer: Self-pay | Admitting: Cardiology

## 2024-03-26 ENCOUNTER — Ambulatory Visit (HOSPITAL_COMMUNITY)
Admission: RE | Admit: 2024-03-26 | Discharge: 2024-03-26 | Disposition: A | Discharge status: Home / Self Care | Source: Ambulatory Visit | Attending: Cardiology | Admitting: Cardiology

## 2024-03-26 VITALS — BP 136/82 | HR 57 | Ht 63.0 in | Wt 108.0 lb

## 2024-03-26 DIAGNOSIS — I255 Ischemic cardiomyopathy: Secondary | ICD-10-CM | POA: Diagnosis not present

## 2024-03-26 DIAGNOSIS — I5022 Chronic systolic (congestive) heart failure: Secondary | ICD-10-CM

## 2024-03-26 DIAGNOSIS — I5084 End stage heart failure: Secondary | ICD-10-CM

## 2024-03-26 DIAGNOSIS — I25119 Atherosclerotic heart disease of native coronary artery with unspecified angina pectoris: Secondary | ICD-10-CM | POA: Diagnosis not present

## 2024-03-26 LAB — COMPREHENSIVE METABOLIC PANEL WITH GFR
ALT: 14 U/L (ref 0–44)
AST: 19 U/L (ref 15–41)
Albumin: 4.3 g/dL (ref 3.5–5.0)
Alkaline Phosphatase: 35 U/L — ABNORMAL LOW (ref 38–126)
Anion gap: 10 (ref 5–15)
BUN: 17 mg/dL (ref 8–23)
CO2: 24 mmol/L (ref 22–32)
Calcium: 9.8 mg/dL (ref 8.9–10.3)
Chloride: 90 mmol/L — ABNORMAL LOW (ref 98–111)
Creatinine, Ser: 0.96 mg/dL (ref 0.44–1.00)
GFR, Estimated: >60 mL/min (ref >60)
Glucose, Bld: 90 mg/dL (ref 70–99)
Potassium: 4.8 mmol/L (ref 3.5–5.1)
Sodium: 124 mmol/L — ABNORMAL LOW (ref 135–145)
Total Bilirubin: 0.9 mg/dL (ref 0.0–1.2)
Total Protein: 7.2 g/dL (ref 6.5–8.1)

## 2024-03-26 LAB — TSH: TSH: 2.708 u[IU]/mL (ref 0.350–4.500)

## 2024-03-26 LAB — BRAIN NATRIURETIC PEPTIDE: B Natriuretic Peptide: 614.1 pg/mL — ABNORMAL HIGH (ref 0.0–100.0)

## 2024-03-26 LAB — DIGOXIN LEVEL: Digoxin Level: 0.9 ng/mL (ref 0.8–2.0)

## 2024-03-26 NOTE — Progress Notes (Signed)
 ADVANCED HEART FAILURE CLINIC NOTE  Referring Physician: Andrew Truman GRADE.,*  Primary Care: Andrew Truman GRADE., MD Primary Cardiologist: Dr. Verlin, Dr. Delford  CC: Stage D, End Stage chronic Systolic heart failure  HPI: Pam [name] is a 74 y.o. female with chronic systolic heart failure secondary to ischemic cardiomyopathy status post ICD, coronary artery disease with CTO of the mid LAD filled with left to left and right to left collaterals presenting today to establish care.  Her cardiac history dates back to December 2017 when she presented with an NSTEMI.  At that time echocardiogram demonstrated LVEF of 35 to 40% with preserved RV function.  Coronary angiography with heavily calcified CTO of the proximal to mid LAD filled by collaterals and calcified 80% stenosis of the circumflex/OM1.  Follow-up cardiac MRI demonstrated nonviable LAD territory.  She ultimately underwent atherectomy and IVUS guided PCI of the proximal circumflex/OM1.  In November 2020 she had a reduction in her LVEF to 25 to 30% and underwent Medtronic ICD placement in early 2021.  According to [name] she did fairly well for a few months however over the past several months has once again had some progression of dyspnea and a decrease in exercise capacity.  She was admitted in September 2023 with acute on chronic CHF exacerbation.  TTE and left heart cath from admission were stable compared to prior.  She was continued on medical therapy for CAD and started on low-dose GDMT for systolic heart failure.  Since that time she has been seen in [facility] clinic where GDMT was further uptitrated.  She also underwent PFTs that were significant for reversible obstructive lung disease and emphysema.  We previously met and discussed possibility of LVAD. At this time [name] and her husband have decided against LVAD therapy. From a functional standpoint, she still goes grocery shopping, performs all ADLs independently; however, has  to move very slowly. Her Bps remain in the low 90s with very little lightheadedness. She is just finishing a course of antibiotics for an upper respiratory infection. Her husband reports that she is often 'staggering'.   Interval hx:  - Unfortunately have not been able to see [name] for the past 4 months. During this time she has been seen in the ER for hyponatremia; taken off jardiance  for pressure ulcer / urinary incontinence. - Planning to undergo ABIs  - She has NYHA III symptoms but attempts to remains as active as possible: cooks, cleans, laundry, etc. She does become very fatigued later in the day. Planning to undergo ABIs for PAD.   Current Outpatient Medications  Medication Sig Dispense Refill   acetaminophen  (TYLENOL ) 500 MG tablet Take 1,000 mg by mouth every 6 (six) hours as needed for moderate pain.     albuterol  (VENTOLIN  HFA) 108 (90 Base) MCG/ACT inhaler Inhale 2 puffs into the lungs every 6 (six) hours as needed for wheezing or shortness of breath. 6.7 g 3   alendronate  (FOSAMAX ) 70 MG tablet TAKE 1 TABLET(70 MG) BY MOUTH 1 TIME A WEEK WITH A FULL GLASS OF WATER AND ON AN EMPTY STOMACH 12 tablet 1   atorvastatin  (LIPITOR ) 80 MG tablet TAKE 1 TABLET(80 MG) BY MOUTH DAILY 90 tablet 1   bisoprolol  (ZEBETA ) 5 MG tablet Take 0.5 tablets (2.5 mg total) by mouth daily.     Cholecalciferol (VITAMIN D3) 50 MCG (2000 UT) TABS Take 2,000 mcg by mouth daily.     clopidogrel  (PLAVIX ) 75 MG tablet TAKE 1 TABLET(75 MG) BY MOUTH  DAILY WITH BREAKFAST 90 tablet 2   digoxin  (LANOXIN ) 0.125 MG tablet Take 0.5 tablets (0.0625 mg total) by mouth daily. 45 tablet 3   furosemide  (LASIX ) 20 MG tablet TAKE 1 TABLET BY MOUTH AS NEEDED 30 tablet 10   levothyroxine  (SYNTHROID ) 25 MCG tablet Take 37.5 mcg by mouth daily before breakfast. 180 tablet 1   nitroGLYCERIN  (NITROSTAT ) 0.4 MG SL tablet Place 1 tablet (0.4 mg total) under the tongue every 5 (five) minutes x 3 doses as needed for chest pain. 75 tablet  3   nystatin cream (MYCOSTATIN) Apply 1 Application topically 2 (two) times daily as needed (rash).     ranolazine  (RANEXA ) 1000 MG SR tablet TAKE 1 TABLET(1000 MG) BY MOUTH TWICE DAILY 180 tablet 3   sacubitril-valsartan (ENTRESTO ) 24-26 MG Take 1 tablet by mouth 2 (two) times daily. 60 tablet 11   spironolactone  (ALDACTONE ) 25 MG tablet TAKE 1 TABLET(25 MG) BY MOUTH DAILY 90 tablet 3   umeclidinium-vilanterol (ANORO ELLIPTA ) 62.5-25 MCG/ACT AEPB Inhale 1 puff into the lungs daily. 60 each 11   valACYclovir  (VALTREX ) 1000 MG tablet Take 1,000 mg by mouth daily.     No current facility-administered medications for this encounter.   PHYSICAL EXAM: Vitals:   03/26/24 1352  BP: 136/82  Pulse: (!) 57  SpO2: 94%   GENERAL: NAD Lungs- CTA CARDIAC:  JVP: 7 cm          Normal rate with regular rhythm. no murmur.  no edema.  ABDOMEN: Soft, non-tender, non-distended.  EXTREMITIES: Warm and well perfused.  NEUROLOGIC: No obvious FND   DATA REVIEW  ECG: Normal sinus rhythm  ECHO: 04/27/22: LVEF 25%-30%, RV function normal.  06/19/19: LVEF 25%-30%, Normal RV function.   CMR:  12/17: . Normal size and thickness of the left ventricle. Systolic function is moderately decreased (LVEF = 38%).  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 There is akinesis of the apical lateral, mid anterior, anteroseptal walls.  The LAD territory is non-viable, RCA and LCX territory is viable.  2. Normal right ventricular size, thickness and systolic function (RVEF = 54%) with no regional wall motion abnormalities.  3.  Normal biatrial size.  4.  Mild mitral and mild to moderate tricuspid regurgitation.  5. Normal size of the aortic root, ascending aorta and pulmonary artery.  6.  Normal pericardium, trivial pericardial effusion.   CATH: 04/29/22:  Prox LAD to Mid LAD lesion is 90% stenosed with 95% stenosed side branch in 1st Sept. Mid LAD to Dist LAD  lesion is 100% stenosed.   Previously placed Prox Cx to Dist Cx stent of unknown type is  widely patent with 0% stenosed side branch in 1st Mrg.   Lat 1st Mrg lesion is 50% stenosed.   LV end diastolic pressure is moderately elevated.   There is no aortic valve stenosis.  08/11/22, RHC: HEMODYNAMICS: RA:                  1 mmHg (mean) RV:                  27/1 mmHg PA:                  31/5 mmHg (14 mean) PCWP:            5 mmHg (mean)  Estimated Fick CO/CI   2.87 L/min, 1.89 L/min/m2                                          TPG                 9  mmHg                                              PVR                 3.14 Wood Units  PAPi                >4  ASSESSMENT & PLAN:  Stage D, End stage-NYHA IIIB, Systolic heart failure Etiology of HF: Secondary to ischemic cardiomyopathy with nonviable myocardium as reported above NYHA class / AHA Stage: NYHA III Volume status & Diuretics:  euvolemic; lasix  20mg  PRN. Takes it 1-2x weekly.  Vasodilators: continue Entresto  24/26mg  BID; hypertensive today; however, has labile BP. Repeat labs today Beta-Blocker: continue bisoprolol  to 2.5mg  daily. Continue digoxin  62.5mcg; repeat digoxin  level today.  MRA: Continue spironolactone  25mg  daily Cardiometabolic: Jardiance  10 mg daily Devices therapies & Valvulopathies: Primary prevention ICD in place Advanced therapies: CPX w/ VE/VCO2 slop of 69.4 and PVO2 of 13.2; CPX consistent with moderate to severe cardiac limitation in addition to obstructive lung disease. I had a lengthy discussion with the patient today regarding advanced therapies. Her COPD, age and frailty make her a marginal LVAD candidate, however, she has no absolute contraindications to durable VAD support. RHC last week with cardiac index of 1.89L/min/m2. Today we had a very length discussion with our LVAD coordinator Lauraine Ip. We discussed all her prior testing including CPX, RHC, echocardiograms  and prognosis of patients with stage D heart failure. We also discussed our LVAD outcomes and national outcomes. After multiple lengthy discussions with [name] and her husband, they have decided against LVAD at this time. She is slowly showing signs of progressive LV failure including hypotension, bradycardia; we will likely have to start decreasing GDMT doses moving forward.  12/13/22: I remain concerned about her prognosis over the next year. She continues to lose weight secondary to cardiac cachexia. She is otherwise very independent with good social support, however, not interested in advanced therapies including LVAD, BATwire, etc. Will repeat labs today. No med changes.  825: Discussed small devices once again such as barostim, etc. She wishes to continue thinking about it. I am very concerned about her 1 year prognosis given end stage cardiomyopathy and slow decline in functional status.   2.  COPD -Now followed by pulmonology; started on inhalers.  - stable on exam; no wheezing.   3.  Coronary artery disease -Management as per primary cardiology -no chest pain; continue ranexa . Saw Dr. Verlin on 03/25/24.    4. Primary prevention ICD - No events on device interrogation; reviewed personally.   I spent 35 minutes caring for this patient today including face to face time, ordering and reviewing labs, reviewing records noted above, discussing end stage cardiomyopathy & available devices , seeing the patient, documenting in the record, and arranging follow ups.    Pam [name] Advanced Heart Failure Mechanical Circulatory Support

## 2024-03-26 NOTE — Patient Instructions (Signed)
 Medication Changes:  No Changes In Medications at this time.   Lab Work:  Labs done today, your results will be available in MyChart, we will contact you for abnormal readings.  Follow-Up in: 1 month as scheduled   At the Advanced Heart Failure Clinic, you and your health needs are our priority. We have a designated team specialized in the treatment of Heart Failure. This Care Team includes your primary Heart Failure Specialized Cardiologist (physician), Advanced Practice Providers (APPs- Physician Assistants and Nurse Practitioners), and Pharmacist who all work together to provide you with the care you need, when you need it.   You may see any of the following providers on your designated Care Team at your next follow up:  Dr. Toribio Fuel Dr. Ezra Shuck Dr. Ria Commander Dr. Odis Brownie Greig Mosses, NP Caffie Shed, [place] [facility] [place], [place] Beckey Coe, NP [place] Lee, NP Tinnie Redman, PharmD   Please be sure to bring in all your medications bottles to every appointment.   Need to Contact Us :  If you have any questions or concerns before your next appointment please send us  a message through [place] or call our office at [phone].    TO LEAVE A MESSAGE FOR THE NURSE SELECT OPTION 2, PLEASE LEAVE A MESSAGE INCLUDING: YOUR NAME DATE OF BIRTH CALL BACK NUMBER REASON FOR CALL**this is important as we prioritize the call backs  YOU WILL RECEIVE A CALL BACK THE SAME DAY AS LONG AS YOU CALL BEFORE 4:00 P

## 2024-04-04 ENCOUNTER — Encounter: Payer: Self-pay | Admitting: Internal Medicine

## 2024-04-30 NOTE — Progress Notes (Signed)
 ADVANCED HEART FAILURE CLINIC NOTE  Referring Physician: Andrew Truman GRADE.,*  Primary Care: Andrew Truman GRADE., MD Primary Cardiologist: Dr. Verlin, Dr. Delford  CC: Stage D, End Stage chronic Systolic heart failure  HPI: Pam [name] is a 73 y.o. female with chronic systolic heart failure secondary to ischemic cardiomyopathy status post ICD, coronary artery disease with CTO of the mid LAD filled with left to left and right to left collaterals. Former smoker, quit 2017.    Her cardiac history dates back to December 2017 when she presented with an NSTEMI.  At that time echocardiogram demonstrated LVEF of 35 to 40% with preserved RV function.  Coronary angiography with heavily calcified CTO of the proximal to mid LAD filled by collaterals and calcified 80% stenosis of the circumflex/OM1.  Follow-up cardiac MRI demonstrated nonviable LAD territory.  She ultimately underwent atherectomy and IVUS guided PCI of the proximal circumflex/OM1.  In November 2020 she had a reduction in her LVEF to 25 to 30% and underwent Medtronic ICD placement in early 2021.  According to Ms. [name] she did fairly well for a few months however over the past several months has once again had some progression of dyspnea and a decrease in exercise capacity.  She was admitted in September 2023 with acute on chronic CHF exacerbation.  TTE and left heart cath from admission were stable compared to prior.  She was continued on medical therapy for CAD and started on low-dose GDMT for systolic heart failure.  Since that time she has been seen in [facility] clinic where GDMT was further uptitrated.  She also underwent PFTs that were significant for reversible obstructive lung disease and emphysema.  Dr Gardenia previously met and discussed possibility of LVAD. She had decided to hold off .  Interval hx:  - Today she returns for HF follow up.Overall feeling fine. Gets SOB with exertion. Denies PND/Orthopnea. Has calf pain when she is  walking. Appetite comes and goes. No fever or chills. Weight at home 108 pounds. Taking all medications. Takes lasix  about once a month.   Current Outpatient Medications  Medication Sig Dispense Refill   acetaminophen  (TYLENOL ) 500 MG tablet Take 1,000 mg by mouth every 6 (six) hours as needed for moderate pain.     albuterol  (VENTOLIN  HFA) 108 (90 Base) MCG/ACT inhaler Inhale 2 puffs into the lungs every 6 (six) hours as needed for wheezing or shortness of breath. 6.7 g 3   alendronate  (FOSAMAX ) 70 MG tablet TAKE 1 TABLET(70 MG) BY MOUTH 1 TIME A WEEK WITH A FULL GLASS OF WATER AND ON AN EMPTY STOMACH 12 tablet 1   atorvastatin  (LIPITOR ) 80 MG tablet TAKE 1 TABLET(80 MG) BY MOUTH DAILY 90 tablet 1   bisoprolol  (ZEBETA ) 5 MG tablet Take 0.5 tablets (2.5 mg total) by mouth daily.     Cholecalciferol (VITAMIN D3) 50 MCG (2000 UT) TABS Take 2,000 mcg by mouth daily.     clopidogrel  (PLAVIX ) 75 MG tablet TAKE 1 TABLET(75 MG) BY MOUTH DAILY WITH BREAKFAST 90 tablet 2   digoxin  (LANOXIN ) 0.125 MG tablet Take 0.5 tablets (0.0625 mg total) by mouth daily. 45 tablet 3   furosemide  (LASIX ) 20 MG tablet TAKE 1 TABLET BY MOUTH AS NEEDED 30 tablet 10   levothyroxine  (SYNTHROID ) 25 MCG tablet Take 37.5 mcg by mouth daily before breakfast. 180 tablet 1   nitroGLYCERIN  (NITROSTAT ) 0.4 MG SL tablet Place 1 tablet (0.4 mg total) under the tongue every 5 (five) minutes x 3 doses as needed  for chest pain. 75 tablet 3   nystatin cream (MYCOSTATIN) Apply 1 Application topically 2 (two) times daily as needed (rash).     ranolazine  (RANEXA ) 1000 MG SR tablet TAKE 1 TABLET(1000 MG) BY MOUTH TWICE DAILY 180 tablet 3   sacubitril-valsartan (ENTRESTO ) 24-26 MG Take 1 tablet by mouth 2 (two) times daily. 60 tablet 11   spironolactone  (ALDACTONE ) 25 MG tablet TAKE 1 TABLET(25 MG) BY MOUTH DAILY 90 tablet 3   umeclidinium-vilanterol (ANORO ELLIPTA ) 62.5-25 MCG/ACT AEPB Inhale 1 puff into the lungs daily. 60 each 11    valACYclovir  (VALTREX ) 1000 MG tablet Take 1,000 mg by mouth daily.     No current facility-administered medications for this encounter.   PHYSICAL EXAM: Vitals:   05/01/24 1006  BP: 138/62  Pulse: (!) 56  SpO2: 97%   Wt Readings from Last 3 Encounters:  05/01/24 50 kg (110 lb 3.2 oz)  03/26/24 49 kg (108 lb)  03/25/24 48 kg (105 lb 12.8 oz)    GENERAL: NAD General:   No resp difficulty Neck: no JVD.  Cor: Regular rate & rhythm.  Lungs: clear Abdomen: soft, nontender, nondistended.  Extremities: no  edema Neuro: alert & oriented x3   DATA REVIEW  ECG: Normal sinus rhythm  ECHO: 04/27/22: LVEF 25%-30%, RV function normal.  06/19/19: LVEF 25%-30%, Normal RV function.   CMR:  12/17: . Normal size and thickness of the left ventricle. Systolic function is moderately decreased (LVEF = 38%).  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 There is akinesis of the apical lateral, mid anterior, anteroseptal walls.  The LAD territory is non-viable, RCA and LCX territory is viable.  2. Normal right ventricular size, thickness and systolic function (RVEF = 54%) with no regional wall motion abnormalities.  3.  Normal biatrial size.  4.  Mild mitral and mild to moderate tricuspid regurgitation.  5. Normal size of the aortic root, ascending aorta and pulmonary artery.  6.  Normal pericardium, trivial pericardial effusion.   CATH: 04/29/22:  Prox LAD to Mid LAD lesion is 90% stenosed with 95% stenosed side branch in 1st Sept. Mid LAD to Dist LAD lesion is 100% stenosed.   Previously placed Prox Cx to Dist Cx stent of unknown type is  widely patent with 0% stenosed side branch in 1st Mrg.   Lat 1st Mrg lesion is 50% stenosed.   LV end diastolic pressure is moderately elevated.   There is no aortic valve stenosis.  08/11/22, RHC: HEMODYNAMICS: RA:                  1 mmHg (mean) RV:                  27/1 mmHg PA:                   31/5 mmHg (14 mean) PCWP:            5 mmHg (mean)                                      Estimated Fick CO/CI   2.87 L/min, 1.89 L/min/m2  TPG                 9  mmHg                                              PVR                 3.14 Wood Units  PAPi                >4  ASSESSMENT & PLAN:  Chronic HFrEF, Stage D, End stage-NYHA IIIB Etiology of HF: Secondary to ischemic cardiomyopathy with nonviable myocardium as reported above NYHA class / AHA Stage: NYHA III Volume status & Diuretics:  Appears euvolemic. Continue lasix  as needed.  Vasodilators: Increase entresto  49-51 mg twice a day . Check BMET in 7 days.  Beta-Blocker: continue bisoprolol  to 2.5mg  daily. Continue digoxin  62.5mcg;repeat dig level next visit.  MRA: Continue spironolactone  25mg  daily Cardiometabolic: Off [id number] due to rash.  Devices therapies & Valvulopathies: Primary prevention ICD in place Advanced therapies: CPX w/ VE/VCO2 slop of 69.4 and PVO2 of 13.2; CPX consistent with moderate to severe cardiac limitation in addition to obstructive lung disease. Her COPD, age and frailty make her a marginal LVAD candidate, however, she has no absolute contraindications to durable VAD support.  Dr Gardenia discussed advanced therapies during multiple visits.  After multiple lengthy discussions with Ms. Roam and her husband, they have decided against LVAD at this time. 12/13/22: I remain concerned about her prognosis over the next year. She continues to lose weight secondary to cardiac cachexia. She is otherwise very independent with good social support, however, not interested in advanced therapies including LVAD, BATwire, etc. Will repeat labs today. No med changes.  825: Discussed small devices once again such as barostim, etc. She wishes to continue thinking about it. I am very concerned about her 1 year prognosis given end stage cardiomyopathy and slow decline in functional status.   2.   COPD -Now followed by pulmonology; started on inhalers.   3.  Coronary artery disease -Management as per primary cardiology -No chest pain. Continue ranexa .    4. Primary prevention ICD -Device interrogation- No VT/AF. Reviewed    Follow up in 4 weeks.   Poetry Cerro NP-C  10:36 AM

## 2024-05-01 ENCOUNTER — Encounter (HOSPITAL_COMMUNITY): Payer: Self-pay | Admitting: Cardiology

## 2024-05-01 ENCOUNTER — Ambulatory Visit (HOSPITAL_COMMUNITY)
Admission: RE | Admit: 2024-05-01 | Discharge: 2024-05-01 | Disposition: A | Discharge status: Home / Self Care | Source: Ambulatory Visit | Attending: Adult Health | Admitting: Adult Health

## 2024-05-01 VITALS — BP 138/62 | HR 56 | Ht 63.0 in | Wt 110.2 lb

## 2024-05-01 DIAGNOSIS — E88A Wasting disease (syndrome) due to underlying condition: Secondary | ICD-10-CM | POA: Insufficient documentation

## 2024-05-01 DIAGNOSIS — Z87891 Personal history of nicotine dependence: Secondary | ICD-10-CM | POA: Insufficient documentation

## 2024-05-01 DIAGNOSIS — I5084 End stage heart failure: Secondary | ICD-10-CM | POA: Diagnosis not present

## 2024-05-01 DIAGNOSIS — I252 Old myocardial infarction: Secondary | ICD-10-CM | POA: Insufficient documentation

## 2024-05-01 DIAGNOSIS — Z955 Presence of coronary angioplasty implant and graft: Secondary | ICD-10-CM | POA: Insufficient documentation

## 2024-05-01 DIAGNOSIS — I255 Ischemic cardiomyopathy: Secondary | ICD-10-CM | POA: Diagnosis not present

## 2024-05-01 DIAGNOSIS — Z95810 Presence of automatic (implantable) cardiac defibrillator: Secondary | ICD-10-CM | POA: Diagnosis not present

## 2024-05-01 DIAGNOSIS — M79669 Pain in unspecified lower leg: Secondary | ICD-10-CM | POA: Diagnosis not present

## 2024-05-01 DIAGNOSIS — Z79899 Other long term (current) drug therapy: Secondary | ICD-10-CM | POA: Diagnosis not present

## 2024-05-01 DIAGNOSIS — J439 Emphysema, unspecified: Secondary | ICD-10-CM | POA: Diagnosis not present

## 2024-05-01 DIAGNOSIS — I5022 Chronic systolic (congestive) heart failure: Secondary | ICD-10-CM | POA: Insufficient documentation

## 2024-05-01 DIAGNOSIS — I2510 Atherosclerotic heart disease of native coronary artery without angina pectoris: Secondary | ICD-10-CM | POA: Diagnosis not present

## 2024-05-01 MED ORDER — SACUBITRIL-VALSARTAN 49-51 MG PO TABS: 1.0000 | ORAL_TABLET | Freq: Two times a day (BID) | ORAL | 3 refills | Status: DC

## 2024-05-01 NOTE — Patient Instructions (Signed)
 Medication Changes:  INCREASE ENTRESTO  TO 49/51MG  TWICE DAILY   Lab Work:  PLEASE RETURN FOR LABS IN 7 DAYS AS SCHEDULED FOR REPEAT LABS  Follow-Up in: 4 WEEKS AS SCHEDULED WITH APP CLINIC   At the [facility], you and your health needs are our priority. We have a designated team specialized in the treatment of Heart Failure. This Care Team includes your primary Heart Failure Specialized Cardiologist (physician), Advanced Practice Providers (APPs- Physician Assistants and Nurse Practitioners), and Pharmacist who all work together to provide you with the care you need, when you need it.   You may see any of the following providers on your designated Care Team at your next follow up:  Dr. Toribio Fuel Dr. Ezra Shuck Dr. Ria Commander Dr. Odis Brownie Greig Mosses, NP Caffie Shed, [place] [facility] [place], [place] Beckey Coe, NP [place] Lee, NP Tinnie Redman, PharmD   Please be sure to bring in all your medications bottles to every appointment.   Need to Contact Us :  If you have any questions or concerns before your next appointment please send us  a message through [place] or call our office at [phone].    TO LEAVE A MESSAGE FOR THE NURSE SELECT OPTION 2, PLEASE LEAVE A MESSAGE INCLUDING: YOUR NAME DATE OF BIRTH CALL BACK NUMBER REASON FOR CALL**this is important as we prioritize the call backs  YOU WILL RECEIVE A CALL BACK THE SAME DAY AS LONG AS YOU CALL BEFORE 4:00 PM

## 2024-05-03 NOTE — Progress Notes (Signed)
 Remote ICD Transmission

## 2024-05-08 ENCOUNTER — Ambulatory Visit (HOSPITAL_COMMUNITY)
Admission: RE | Admit: 2024-05-08 | Discharge: 2024-05-08 | Disposition: A | Discharge status: Home / Self Care | Source: Ambulatory Visit | Attending: Cardiology | Admitting: Cardiology

## 2024-05-08 ENCOUNTER — Ambulatory Visit (HOSPITAL_COMMUNITY): Payer: Self-pay | Admitting: Adult Health

## 2024-05-08 DIAGNOSIS — I5022 Chronic systolic (congestive) heart failure: Secondary | ICD-10-CM | POA: Insufficient documentation

## 2024-05-08 LAB — BASIC METABOLIC PANEL WITH GFR
Anion gap: 11 (ref 5–15)
BUN: 17 mg/dL (ref 8–23)
CO2: 24 mmol/L (ref 22–32)
Calcium: 9.6 mg/dL (ref 8.9–10.3)
Chloride: 95 mmol/L — ABNORMAL LOW (ref 98–111)
Creatinine, Ser: 1.14 mg/dL — ABNORMAL HIGH (ref 0.44–1.00)
GFR, Estimated: 51 mL/min — ABNORMAL LOW (ref >60)
Glucose, Bld: 102 mg/dL — ABNORMAL HIGH (ref 70–99)
Potassium: 4.8 mmol/L (ref 3.5–5.1)
Sodium: 130 mmol/L — ABNORMAL LOW (ref 135–145)

## 2024-05-16 ENCOUNTER — Ambulatory Visit (HOSPITAL_BASED_OUTPATIENT_CLINIC_OR_DEPARTMENT_OTHER)
Admission: RE | Admit: 2024-05-16 | Discharge: 2024-05-16 | Disposition: A | Discharge status: Home / Self Care | Source: Ambulatory Visit | Attending: Cardiovascular Disease | Admitting: Cardiovascular Disease

## 2024-05-16 ENCOUNTER — Encounter (HOSPITAL_COMMUNITY)

## 2024-05-16 ENCOUNTER — Ambulatory Visit (HOSPITAL_COMMUNITY)
Admission: RE | Admit: 2024-05-16 | Discharge: 2024-05-16 | Disposition: A | Discharge status: Home / Self Care | Source: Ambulatory Visit | Attending: Cardiovascular Disease | Admitting: Cardiovascular Disease

## 2024-05-16 ENCOUNTER — Ambulatory Visit: Payer: Self-pay | Admitting: Cardiovascular Disease

## 2024-05-16 DIAGNOSIS — M79604 Pain in right leg: Secondary | ICD-10-CM | POA: Insufficient documentation

## 2024-05-16 DIAGNOSIS — M79605 Pain in left leg: Secondary | ICD-10-CM | POA: Insufficient documentation

## 2024-05-16 LAB — VAS US ABI WITH/WO TBI
Left ABI: 0.7
Right ABI: 0.6

## 2024-05-21 ENCOUNTER — Ambulatory Visit: Payer: Federal, State, Local not specified - PPO

## 2024-05-21 DIAGNOSIS — I255 Ischemic cardiomyopathy: Secondary | ICD-10-CM

## 2024-05-23 LAB — CUP PACEART REMOTE DEVICE CHECK
Battery Remaining Longevity: 92 mo
Battery Voltage: 3 V
Brady Statistic RV Percent Paced: 0.02 %
Date Time Interrogation Session: 20251021022604
HighPow Impedance: 56 Ohm
Implantable Lead Connection Status: 753985
Implantable Lead Implant Date: 20210127
Implantable Lead Location: 753860
Implantable Pulse Generator Implant Date: 20210127
Lead Channel Impedance Value: 323 Ohm
Lead Channel Impedance Value: 380 Ohm
Lead Channel Pacing Threshold Amplitude: 0.625 V
Lead Channel Pacing Threshold Pulse Width: 0.4 ms
Lead Channel Sensing Intrinsic Amplitude: 8.75 mV
Lead Channel Sensing Intrinsic Amplitude: 8.75 mV
Lead Channel Setting Pacing Amplitude: 2 V
Lead Channel Setting Pacing Pulse Width: 0.4 ms
Lead Channel Setting Sensing Sensitivity: 0.3 mV
Zone Setting Status: 755011
Zone Setting Status: 755011

## 2024-05-24 NOTE — Progress Notes (Signed)
 Remote ICD Transmission

## 2024-05-24 NOTE — Progress Notes (Signed)
 ADVANCED HEART FAILURE CLINIC NOTE  Primary Care: Andrew Truman GRADE., MD Primary Cardiologist: Dr. Verlin, Dr. Delford HF Cardiologist: assign to Dr. Zenaida  HPI: Pam [name] is a 73 y.o. female with chronic systolic heart failure secondary to ischemic cardiomyopathy status post ICD, coronary artery disease with CTO of the mid LAD filled with left to left and right to left collaterals. Former smoker, quit 2017.    Her cardiac history dates back to December 2017 when she presented with an NSTEMI.  At that time echocardiogram demonstrated LVEF of 35 to 40% with preserved RV function.  Coronary angiography with heavily calcified CTO of the proximal to mid LAD filled by collaterals and calcified 80% stenosis of the circumflex/OM1.  Follow-up cardiac MRI demonstrated nonviable LAD territory.  She ultimately underwent atherectomy and IVUS guided PCI of the proximal circumflex/OM1.  In November 2020 she had a reduction in her LVEF to 25 to 30% and underwent Medtronic ICD placement in early 2021.  According to Ms. [name] she did fairly well for a few months however over the past several months has once again had some progression of dyspnea and a decrease in exercise capacity.  She was admitted in September 2023 with acute on chronic CHF exacerbation.  TTE and left heart cath from admission were stable compared to prior.  She was continued on medical therapy for CAD and started on low-dose GDMT for systolic heart failure.  Since that time she has been seen in [facility] clinic where GDMT was further uptitrated.  She also underwent PFTs that were significant for reversible obstructive lung disease and emphysema.  Dr Gardenia previously met and discussed possibility of LVAD. She had decided to hold off .  Interval hx:   Today she returns for HF follow up. Overall feeling fine. She has SOB walking further distances on flat ground. Her energy levels are ok. She has claudication symptoms when walking, had ABIs and  referred to PVD clinic. Denies palpitations, abnormal bleeding, CP, dizziness, edema, or PND/Orthopnea. Appetite ok. Weight at home 103-108 pounds. Taking all medications, takes Lasix  3-4x/month. Not interested in Barostim or LVAD.   Current Outpatient Medications  Medication Sig Dispense Refill   acetaminophen  (TYLENOL ) 500 MG tablet Take 1,000 mg by mouth every 6 (six) hours as needed for moderate pain.     albuterol  (VENTOLIN  HFA) 108 (90 Base) MCG/ACT inhaler Inhale 2 puffs into the lungs every 6 (six) hours as needed for wheezing or shortness of breath. 6.7 g 3   alendronate  (FOSAMAX ) 70 MG tablet TAKE 1 TABLET(70 MG) BY MOUTH 1 TIME A WEEK WITH A FULL GLASS OF WATER AND ON AN EMPTY STOMACH 12 tablet 1   atorvastatin  (LIPITOR ) 80 MG tablet TAKE 1 TABLET(80 MG) BY MOUTH DAILY 90 tablet 1   bisoprolol  (ZEBETA ) 5 MG tablet Take 0.5 tablets (2.5 mg total) by mouth daily.     Cholecalciferol (VITAMIN D3) 50 MCG (2000 UT) TABS Take 2,000 mcg by mouth daily.     clopidogrel  (PLAVIX ) 75 MG tablet TAKE 1 TABLET(75 MG) BY MOUTH DAILY WITH BREAKFAST 90 tablet 2   digoxin  (LANOXIN ) 0.125 MG tablet Take 0.5 tablets (0.0625 mg total) by mouth daily. 45 tablet 3   furosemide  (LASIX ) 20 MG tablet TAKE 1 TABLET BY MOUTH AS NEEDED 30 tablet 10   levothyroxine  (SYNTHROID ) 25 MCG tablet Take 37.5 mcg by mouth daily before breakfast. 180 tablet 1   nitroGLYCERIN  (NITROSTAT ) 0.4 MG SL tablet Place 1 tablet (0.4 mg total) under  the tongue every 5 (five) minutes x 3 doses as needed for chest pain. 75 tablet 3   nystatin cream (MYCOSTATIN) Apply 1 Application topically 2 (two) times daily as needed (rash).     ranolazine  (RANEXA ) 1000 MG SR tablet TAKE 1 TABLET(1000 MG) BY MOUTH TWICE DAILY 180 tablet 3   sacubitril-valsartan (ENTRESTO ) 49-51 MG Take 1 tablet by mouth 2 (two) times daily. 60 tablet 3   spironolactone  (ALDACTONE ) 25 MG tablet TAKE 1 TABLET(25 MG) BY MOUTH DAILY 90 tablet 3   umeclidinium-vilanterol  (ANORO ELLIPTA ) 62.5-25 MCG/ACT AEPB Inhale 1 puff into the lungs daily. 60 each 11   valACYclovir  (VALTREX ) 1000 MG tablet Take 1,000 mg by mouth daily.     No current facility-administered medications for this encounter.   Wt Readings from Last 3 Encounters:  05/29/24 49.4 kg (109 lb)  05/01/24 50 kg (110 lb 3.2 oz)  03/26/24 49 kg (108 lb)   BP (!) 132/56   Pulse (!) 53   Wt 49.4 kg (109 lb)   SpO2 98%   BMI 19.31 kg/m   PHYSICAL EXAM: General:  NAD. No resp difficulty, walked into clinic, elderly HEENT: Normal Neck: Supple. No JVD. Cor: Regular rate & rhythm. No rubs, gallops or murmurs. Lungs: Clear, diminished in bases Abdomen: Soft, nontender, nondistended.  Extremities: No cyanosis, clubbing, rash, edema Neuro: Alert & oriented x 3, moves all 4 extremities w/o difficulty. Affect pleasant.  Device interrogation (personally reviewed): OptiVol ok, 1.4 hr/day activity, 100% VS. No VT  DATA REVIEW  ECG: Normal sinus rhythm  ECHO: 04/27/22: LVEF 25%-30%, RV function normal.  06/19/19: LVEF 25%-30%, Normal RV function.   CMR:  12/17: . Normal size and thickness of the left ventricle. Systolic function is moderately decreased (LVEF = 38%).  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 There is akinesis of the apical lateral, mid anterior, anteroseptal walls.  The LAD territory is non-viable, RCA and LCX territory is viable.  2. Normal right ventricular size, thickness and systolic function (RVEF = 54%) with no regional wall motion abnormalities.  3.  Normal biatrial size.  4.  Mild mitral and mild to moderate tricuspid regurgitation.  5. Normal size of the aortic root, ascending aorta and pulmonary artery.  6.  Normal pericardium, trivial pericardial effusion.   CATH: 04/29/22:  Prox LAD to Mid LAD lesion is 90% stenosed with 95% stenosed side branch in 1st Sept. Mid LAD to Dist LAD lesion is 100% stenosed.   Previously  placed Prox Cx to Dist Cx stent of unknown type is  widely patent with 0% stenosed side branch in 1st Mrg.   Lat 1st Mrg lesion is 50% stenosed.   LV end diastolic pressure is moderately elevated.   There is no aortic valve stenosis.  08/11/22, RHC: HEMODYNAMICS: RA:                  1 mmHg (mean) RV:                  27/1 mmHg PA:                  31/5 mmHg (14 mean) PCWP:            5 mmHg (mean)  Estimated Fick CO/CI   2.87 L/min, 1.89 L/min/m2                                          TPG                 9  mmHg                                              PVR                 3.14 Wood Units  PAPi                >4  ASSESSMENT & PLAN: Chronic HFrEF, Stage D, End stage  Etiology of HF: Secondary to ischemic cardiomyopathy with nonviable myocardium as reported above NYHA class / AHA Stage: NYHA III, functional class confounded by deconditioning and PVD. She is not volume overloaded. - Continue bisoprolol  2.5 mg daily - Continue digoxin  0.0625 mg daily - Continue Lasix  20 mg PRN - Continue Entresto  49/51 mg bid - Continue spironolactone  25 mg daily. - Off SGLTL2i due to rash.  Devices therapies & Valvulopathies: Primary prevention ICD in place Advanced therapies: CPX w/ VE/VCO2 slop of 69.4 and PVO2 of 13.2; CPX consistent with moderate to severe cardiac limitation in addition to obstructive lung disease. Her COPD, age and frailty make her a marginal LVAD candidate, however, she has no absolute contraindications to durable VAD support.  Dr Gardenia discussed advanced therapies during multiple visits.  After multiple lengthy discussions with Ms. Andreen and her husband, they have decided against LVAD at this time. 12/13/22: I remain concerned about her prognosis over the next year. She continues to lose weight secondary to cardiac cachexia. She is otherwise very independent with good social support, however, not interested in advanced therapies including  LVAD, BATwire, etc. Will repeat labs today. No med changes.  825: Discussed small devices once again such as barostim, etc. She wishes to continue thinking about it. Concerned about her 1 year prognosis given end stage cardiomyopathy and slow decline in functional status.  - Labs today.  COPD - Now followed by pulmonology; started on inhalers.   Coronary artery disease - No chest pain - Continue Ranexa  - Management as per primary cardiology  Primary prevention ICD -  No VT/AF on device interrogation today   Functional decline over the past year, suspect mostly due to advanced heart failure. She is not interested in advanced therapies.  Follow up in 2-3 months with Dr. Zenaida.  Harlene HERO Jester Klingberg FNP-BC  11:40 AM

## 2024-05-28 ENCOUNTER — Ambulatory Visit: Payer: Self-pay | Admitting: Cardiology

## 2024-05-28 ENCOUNTER — Telehealth (HOSPITAL_COMMUNITY): Payer: Self-pay

## 2024-05-28 NOTE — Telephone Encounter (Signed)
 Called to confirm/remind patient of their appointment at the [facility] on 05/29/24.   Appointment:   [x] Confirmed  [] Left mess   [] No answer/No voice mail  [] VM Full/unable to leave message  [] Phone not in service  Patient reminded to bring all medications and/or complete list.  Confirmed patient has transportation. Gave directions, instructed to utilize valet parking.

## 2024-05-29 ENCOUNTER — Encounter (HOSPITAL_COMMUNITY): Payer: Self-pay

## 2024-05-29 ENCOUNTER — Ambulatory Visit (HOSPITAL_COMMUNITY)
Admission: RE | Admit: 2024-05-29 | Discharge: 2024-05-29 | Disposition: A | Discharge status: Home / Self Care | Source: Ambulatory Visit | Attending: Family Medicine | Admitting: Family Medicine

## 2024-05-29 ENCOUNTER — Ambulatory Visit (HOSPITAL_COMMUNITY): Payer: Self-pay | Admitting: Family Medicine

## 2024-05-29 VITALS — BP 132/56 | HR 53 | Wt 109.0 lb

## 2024-05-29 DIAGNOSIS — Z95810 Presence of automatic (implantable) cardiac defibrillator: Secondary | ICD-10-CM | POA: Insufficient documentation

## 2024-05-29 DIAGNOSIS — Z955 Presence of coronary angioplasty implant and graft: Secondary | ICD-10-CM | POA: Insufficient documentation

## 2024-05-29 DIAGNOSIS — E88A Wasting disease (syndrome) due to underlying condition: Secondary | ICD-10-CM | POA: Diagnosis not present

## 2024-05-29 DIAGNOSIS — I252 Old myocardial infarction: Secondary | ICD-10-CM | POA: Diagnosis not present

## 2024-05-29 DIAGNOSIS — I255 Ischemic cardiomyopathy: Secondary | ICD-10-CM | POA: Insufficient documentation

## 2024-05-29 DIAGNOSIS — I5022 Chronic systolic (congestive) heart failure: Secondary | ICD-10-CM | POA: Diagnosis present

## 2024-05-29 DIAGNOSIS — I2510 Atherosclerotic heart disease of native coronary artery without angina pectoris: Secondary | ICD-10-CM | POA: Diagnosis not present

## 2024-05-29 DIAGNOSIS — R54 Age-related physical debility: Secondary | ICD-10-CM | POA: Diagnosis not present

## 2024-05-29 DIAGNOSIS — J449 Chronic obstructive pulmonary disease, unspecified: Secondary | ICD-10-CM

## 2024-05-29 DIAGNOSIS — I739 Peripheral vascular disease, unspecified: Secondary | ICD-10-CM | POA: Diagnosis not present

## 2024-05-29 DIAGNOSIS — Z79899 Other long term (current) drug therapy: Secondary | ICD-10-CM | POA: Diagnosis not present

## 2024-05-29 DIAGNOSIS — J439 Emphysema, unspecified: Secondary | ICD-10-CM | POA: Diagnosis not present

## 2024-05-29 DIAGNOSIS — Z87891 Personal history of nicotine dependence: Secondary | ICD-10-CM | POA: Insufficient documentation

## 2024-05-29 LAB — BRAIN NATRIURETIC PEPTIDE: B Natriuretic Peptide: 841.1 pg/mL — ABNORMAL HIGH (ref 0.0–100.0)

## 2024-05-29 LAB — BASIC METABOLIC PANEL WITH GFR
Anion gap: 15 (ref 5–15)
BUN: 15 mg/dL (ref 8–23)
CO2: 22 mmol/L (ref 22–32)
Calcium: 9.5 mg/dL (ref 8.9–10.3)
Chloride: 89 mmol/L — ABNORMAL LOW (ref 98–111)
Creatinine, Ser: 1.01 mg/dL — ABNORMAL HIGH (ref 0.44–1.00)
GFR, Estimated: 59 mL/min — ABNORMAL LOW (ref >60)
Glucose, Bld: 84 mg/dL (ref 70–99)
Potassium: 4.9 mmol/L (ref 3.5–5.1)
Sodium: 126 mmol/L — ABNORMAL LOW (ref 135–145)

## 2024-05-29 LAB — DIGOXIN LEVEL: Digoxin Level: 0.9 ng/mL (ref 0.8–2.0)

## 2024-05-29 NOTE — Patient Instructions (Addendum)
 Thank you for coming in today  If you had labs drawn today, any labs that are abnormal the clinic will call you No news is good news  Medications: No change  Follow up appointments:  Your physician recommends that you schedule a follow-up appointment in:  3 months with Dr. Zenaida  Please call our office to schedule the follow-up appointment in December 2025.   Do the following things EVERYDAY: Weigh yourself in the morning before breakfast. Write it down and keep it in a log. Take your medicines as prescribed Eat low salt foods--Limit salt (sodium) to 2000 mg per day.  Stay as active as you can everyday Limit all fluids for the day to less than 2 liters   At the [facility], you and your health needs are our priority. As part of our continuing mission to provide you with exceptional heart care, we have created designated Provider Care Teams. These Care Teams include your primary Cardiologist (physician) and Advanced Practice Providers (APPs- Physician Assistants and Nurse Practitioners) who all work together to provide you with the care you need, when you need it.   You may see any of the following providers on your designated Care Team at your next follow up: Dr Toribio Fuel Dr Ezra Shuck Dr. Ria Gardenia Greig Lenetta, NP Caffie Shed, [place] [facility] [place], [place] Beckey Coe, NP Tinnie Redman, PharmD   Please be sure to bring in all your medications bottles to every appointment.    Thank you for choosing [facility]  If you have any questions or concerns before your next appointment please send us  a message through Leslie or call our office at [phone].    TO LEAVE A MESSAGE FOR THE NURSE SELECT OPTION 2, PLEASE LEAVE A MESSAGE INCLUDING: YOUR NAME DATE OF BIRTH CALL BACK NUMBER REASON FOR CALL**this is important as we prioritize the call backs  YOU WILL RECEIVE A CALL BACK THE SAME  DAY AS LONG AS YOU CALL BEFORE 4:00 PM

## 2024-05-30 ENCOUNTER — Telehealth (HOSPITAL_COMMUNITY): Payer: Self-pay | Admitting: *Deleted

## 2024-05-30 ENCOUNTER — Other Ambulatory Visit

## 2024-05-30 DIAGNOSIS — I5022 Chronic systolic (congestive) heart failure: Secondary | ICD-10-CM

## 2024-05-30 NOTE — Telephone Encounter (Signed)
 Called patient per Harlene Gainer, NP with following lab results and instructions:  Sodium is chronically low, dig level mildly elevated. She will need a digoxin  trough soon (hold dig before lab)  She has appointment Monday with Endocrinology. Pt verbalized understanding to hold Digoxin  that morning and she will get lab at that time. Order placed, Endocrinology appointment updated with request.

## 2024-05-31 LAB — TSH: TSH: 4.84 m[IU]/L — ABNORMAL HIGH (ref 0.40–4.50)

## 2024-05-31 LAB — VITAMIN D 25 HYDROXY (VIT D DEFICIENCY, FRACTURES): Vit D, 25-Hydroxy: 59 ng/mL (ref 30–100)

## 2024-05-31 LAB — BASIC METABOLIC PANEL WITHOUT GFR
BUN/Creatinine Ratio: 16 (calc) (ref 6–22)
BUN: 18 mg/dL (ref 7–25)
CO2: 28 mmol/L (ref 20–32)
Calcium: 10.6 mg/dL — ABNORMAL HIGH (ref 8.6–10.4)
Chloride: 95 mmol/L — ABNORMAL LOW (ref 98–110)
Creat: 1.13 mg/dL — ABNORMAL HIGH (ref 0.60–1.00)
Glucose, Bld: 74 mg/dL (ref 65–99)
Potassium: 5.2 mmol/L (ref 3.5–5.3)
Sodium: 128 mmol/L — ABNORMAL LOW (ref 135–146)

## 2024-05-31 LAB — HEMOGLOBIN A1C
Hgb A1c MFr Bld: 5.4 % (ref <5.7)
Mean Plasma Glucose: 108 mg/dL
eAG (mmol/L): 6 mmol/L

## 2024-05-31 LAB — T4, FREE: Free T4: 1.2 ng/dL (ref 0.8–1.8)

## 2024-06-03 ENCOUNTER — Encounter: Payer: Self-pay | Admitting: Endocrinology

## 2024-06-03 ENCOUNTER — Ambulatory Visit: Admitting: Endocrinology

## 2024-06-03 VITALS — BP 110/72 | Ht 63.0 in | Wt 109.0 lb

## 2024-06-03 DIAGNOSIS — M810 Age-related osteoporosis without current pathological fracture: Secondary | ICD-10-CM | POA: Diagnosis not present

## 2024-06-03 DIAGNOSIS — I5022 Chronic systolic (congestive) heart failure: Secondary | ICD-10-CM | POA: Diagnosis not present

## 2024-06-03 DIAGNOSIS — Z8639 Personal history of other endocrine, nutritional and metabolic disease: Secondary | ICD-10-CM

## 2024-06-03 DIAGNOSIS — E871 Hypo-osmolality and hyponatremia: Secondary | ICD-10-CM

## 2024-06-03 DIAGNOSIS — Z9889 Other specified postprocedural states: Secondary | ICD-10-CM | POA: Diagnosis not present

## 2024-06-03 DIAGNOSIS — E039 Hypothyroidism, unspecified: Secondary | ICD-10-CM

## 2024-06-03 DIAGNOSIS — E559 Vitamin D deficiency, unspecified: Secondary | ICD-10-CM

## 2024-06-03 NOTE — Patient Instructions (Addendum)
 Stop fosamax  after completing what you have, no refill.  Continue Vit D 3 same dose.  Start calcium  ~ 500 mg daily over the counter with supper.   Continue current dose of levothyroxine .

## 2024-06-03 NOTE — Progress Notes (Signed)
 Outpatient Endocrinology Note Pam Rinck, MD  06/03/24  Patient's Name: Pam [name]    DOB: October 28, 1950    MRN: [id number]  REASON OF VISIT: Follow up of osteoporosis / hypothyroidism   PCP: Andrew Truman GRADE., MD  HISTORY OF PRESENT ILLNESS:   Pam [name] is a 73 y.o. old female with past medical history listed below, is here for follow up of bone health issues / osteoporosis /hypothyroidism/hyponatremia.  Pertinent  History: Patient was previously seen by Dr. Von and was last time seen in July 2024.  # Osteoporosis Screening bone density showed osteoporosis at the hip and radius as of 07/2019. Osteoporosis probably related to primary hyperparathyroidism.  Patient has history of primary hyperparathyroidism status post right superior parathyroidectomy in July 2022.   She has been on alendronate  70 mg weekly since 12 / 2020.     Bone density results show improvement as of 03/2022:    Lumbar spine L1-L2 (L3,L 4) Femoral neck (FN) 33% distal radius Ultra distal radius    T-score -0.8 RFN: -2.6 LFN: -2.2 -2.1 -2.9  Change in BMD from previous DXA test (%) +4.0% +3.8% -6.2% N/a    07/2019 Lumbar spine L1-L3 (L4) Femoral neck (FN) 33% distal radius Ultra distal radius  T-score -1.0 RFN: -2.8 LFN: -2.4  -1.6  -2.7    DEXA scan in August 2025 : Relatively stable bone mineral density.    Lumbar spine L1, L2 (L3, L4) Femoral neck (FN) 33% distal radius Ultra distal radius  T-score -0.7 RFN: -2.7 LFN: -2.3 -1.9 -3.2  Change in BMD from previous DXA test (%) +1.3% -2.0% +3.2% N/a  (*) statistically significant  Patient has been taking ? calcium  1 tablet daily and vitamin D3 2000 units daily.   # Primary hypothyroidism -Patient has mild hypothyroidism asymptomatic at the time of diagnosis.  Initially levothyroxine  was started 37.5 mcg daily by primary care provider.  # Hyponatremia She has had long-term hyponatremia of unclear etiology, not on thiazide diuretics except  Lasix  Also not on any other drugs causing hyponatremia. Previously urine osmolality was relatively high at 332 and urine sodium last 39. Has been advised to be restricting her fluids and cut back drinking fluids including water, coffee, juices and occasional diet drinks.  Serum sodium has been in the low 130s range.   Interval history  Patient had DEXA scan in August with relatively stable bone mineral density.  She has been taking Fosamax  weekly.  She has been taking vitamin D3 2020 send daily, recent vitamin D  level acceptable.  Recent laboratory results reviewed.  She has relatively stable serum sodium of 128.  She has been taking levothyroxine  37.5 mcg daily, recent TSH is mildly elevated.  She had normal TSH in August.  Denies change in energy level, overall feeling usual state of health.  No palpitation.  No fall or fracture.  He has been taking vitamin D3 supplement but she reports not taking calcium  supplement.  She eats cheese daily and milk with coffee occasionally.  No other complaints today.  She wants to complete the lab for digoxin  level planned by cardiology today in our clinic.  REVIEW OF SYSTEMS:  As per history of present illness.   PAST MEDICAL HISTORY: Past Medical History:  Diagnosis Date   Abnormal EKG    AICD (automatic cardioverter/defibrillator) present    Alcoholic intoxication without complication    Allergy    Cardiomyopathy, ischemic    Cataract    Chest pain 08/29/2016   Chest pain  08/29/2016   CHF (congestive heart failure) (HCC)    Chronic systolic heart failure (HCC) 08/29/2016   Facial laceration    Fall 07/07/2016   Hyperlipidemia    Hypertension 03/03/2017   Hyponatremia    Hypothyroidism 07/08/2016   NSTEMI (non-ST elevated myocardial infarction) (HCC)    Syncope    Thyroid  disease     PAST SURGICAL HISTORY: Past Surgical History:  Procedure Laterality Date   CARDIAC CATHETERIZATION N/A 07/08/2016   Procedure: Left Heart Cath and  Coronary Angiography;  Surgeon: Lonni Hanson, MD;  [facility]: [facility] INVASIVE CV LAB;  Service: Cardiovascular;  Laterality: N/A;   CARDIAC CATHETERIZATION N/A 08/29/2016   Procedure: Coronary Atherectomy;  Surgeon: Lonni Hanson, MD;  Location: [facility] INVASIVE CV LAB;  Service: Cardiovascular;  Laterality: N/A;   CARDIAC CATHETERIZATION N/A 08/29/2016   Procedure: Coronary Stent Intervention;  Surgeon: Lonni Hanson, MD;  Location: [facility] INVASIVE CV LAB;  Service: Cardiovascular;  Laterality: N/A;   CATARACT EXTRACTION, BILATERAL     COLONOSCOPY WITH PROPOFOL  N/A 11/18/2021   Procedure: COLONOSCOPY WITH PROPOFOL ;  Surgeon: Eda Iha, MD;  Location: [facility] ENDOSCOPY;  Service: Gastroenterology;  Laterality: N/A;   CORONARY ANGIOPLASTY     coronary stents      ICD IMPLANT N/A 08/28/2019   Procedure: ICD IMPLANT;  Surgeon: Inocencio Soyla Lunger, MD;  Location: [facility] INVASIVE CV LAB;  Service: Cardiovascular;  Laterality: N/A;   LEFT HEART CATH AND CORONARY ANGIOGRAPHY N/A 04/29/2022   Procedure: LEFT HEART CATH AND CORONARY ANGIOGRAPHY;  Surgeon: Anner Alm ORN, MD;  Location: [facility] INVASIVE CV LAB;  Service: Cardiovascular;  Laterality: N/A;   PARATHYROIDECTOMY Right 02/02/2021   Procedure: RIGHT SUPERIOR PARATHYROIDECTOMY;  Surgeon: Eletha Boas, MD;  Location: [facility] ORS;  Service: General;  Laterality: Right;   POLYPECTOMY  11/18/2021   Procedure: POLYPECTOMY;  Surgeon: Eda Iha, MD;  Location: [facility] ENDOSCOPY;  Service: Gastroenterology;;   RIGHT HEART CATH N/A 08/11/2022   Procedure: RIGHT HEART CATH;  Surgeon: Gardenia Led, DO;  Location: [facility] INVASIVE CV LAB;  Service: Cardiovascular;  Laterality: N/A;   VAGINAL HYSTERECTOMY      ALLERGIES: Allergies  Allergen Reactions   Lisinopril  Cough    Dry cough    FAMILY HISTORY:  Family History  Problem Relation Age of Onset   Kidney failure Mother    Heart disease Mother        CHF   Heart disease Sister    Colon cancer Neg Hx     Esophageal cancer Neg Hx    Rectal cancer Neg Hx    Stomach cancer Neg Hx     SOCIAL HISTORY: Social History   Socioeconomic History   Marital status: Married    Spouse name: Tefl Teacher   Number of children: 2   Years of education: Not on file   Highest education level: High school graduate  Occupational History   Occupation: Retired-worked for post office  Tobacco Use   Smoking status: Former    Current packs/day: 0.00    Average packs/day: 1 pack/day for 50.0 years (50.0 ttl pk-yrs)    Types: Cigarettes    Start date: 07/07/1966    Quit date: 07/07/2016    Years since quitting: 7.9   Smokeless tobacco: Never  Vaping Use   Vaping status: Never Used  Substance and Sexual Activity   Alcohol use: Not Currently   Drug use: Never   Sexual activity: Not Currently  Other Topics Concern   Not on file  Social History Narrative  Not on file   Social Drivers of Health   Financial Resource Strain: Low Risk  (04/29/2022)   Overall Financial Resource Strain (CARDIA)    Difficulty of Paying Living Expenses: Not hard at all  Food Insecurity: Low Risk  (06/09/2023)   Received from [facility]   Hunger Vital Sign    Within the past 12 months, you worried that your food would run out before you got money to buy more: Never true    Within the past 12 months, the food you bought just didn't last and you didn't have money to get more. : Never true  Transportation Needs: No Transportation Needs (06/09/2023)   Received from [facility]    In the past 12 months, has lack of reliable transportation kept you from medical appointments, meetings, work or from getting things needed for daily living? : No  Physical Activity: Not on file  Stress: Not on file  Social Connections: Not on file    MEDICATIONS:  Current Outpatient Medications  Medication Sig Dispense Refill   acetaminophen  (TYLENOL ) 500 MG tablet Take 1,000 mg by mouth every 6 (six) hours as needed for moderate  pain.     albuterol  (VENTOLIN  HFA) 108 (90 Base) MCG/ACT inhaler Inhale 2 puffs into the lungs every 6 (six) hours as needed for wheezing or shortness of breath. 6.7 g 3   atorvastatin  (LIPITOR ) 80 MG tablet TAKE 1 TABLET(80 MG) BY MOUTH DAILY 90 tablet 1   bisoprolol  (ZEBETA ) 5 MG tablet Take 0.5 tablets (2.5 mg total) by mouth daily.     Cholecalciferol (VITAMIN D3) 50 MCG (2000 UT) TABS Take 2,000 mcg by mouth daily.     clopidogrel  (PLAVIX ) 75 MG tablet TAKE 1 TABLET(75 MG) BY MOUTH DAILY WITH BREAKFAST 90 tablet 2   digoxin  (LANOXIN ) 0.125 MG tablet Take 0.5 tablets (0.0625 mg total) by mouth daily. 45 tablet 3   furosemide  (LASIX ) 20 MG tablet TAKE 1 TABLET BY MOUTH AS NEEDED 30 tablet 10   levothyroxine  (SYNTHROID ) 25 MCG tablet Take 37.5 mcg by mouth daily before breakfast. 180 tablet 1   nitroGLYCERIN  (NITROSTAT ) 0.4 MG SL tablet Place 1 tablet (0.4 mg total) under the tongue every 5 (five) minutes x 3 doses as needed for chest pain. 75 tablet 3   nystatin cream (MYCOSTATIN) Apply 1 Application topically 2 (two) times daily as needed (rash).     ranolazine  (RANEXA ) 1000 MG SR tablet TAKE 1 TABLET(1000 MG) BY MOUTH TWICE DAILY 180 tablet 3   sacubitril-valsartan (ENTRESTO ) 49-51 MG Take 1 tablet by mouth 2 (two) times daily. 60 tablet 3   spironolactone  (ALDACTONE ) 25 MG tablet TAKE 1 TABLET(25 MG) BY MOUTH DAILY 90 tablet 3   umeclidinium-vilanterol (ANORO ELLIPTA ) 62.5-25 MCG/ACT AEPB Inhale 1 puff into the lungs daily. 60 each 11   valACYclovir  (VALTREX ) 1000 MG tablet Take 1,000 mg by mouth daily.     No current facility-administered medications for this visit.    PHYSICAL EXAM: Vitals:   06/03/24 0900  BP: 110/72  Weight: 109 lb (49.4 kg)  Height: 5' 3 (1.6 m)    Body mass index is 19.31 kg/m.   General: Well developed, well nourished female in no apparent distress.  HEENT: AT/Pam [name], no external lesions. Hearing intact to the spoken word Eyes: Conjunctiva clear and no  icterus. Neck: Trachea midline, neck supple , anterior neck scar present status post parathyroidectomy. Lungs: Clear to auscultation, no wheeze. Respirations not labored Neurologic: Alert, oriented, normal  speech, deep tendon biceps reflexes normal,  no gross focal neurological deficit Extremities: No pedal pitting edema, no tremors of outstretched hands. No spine tenderness Skin: Warm, color good.  Psychiatric: Does not appear depressed or anxious  PERTINENT HISTORIC LABORATORY AND IMAGING STUDIES:  All pertinent laboratory results were reviewed. Please see HPI also for further details.   Lab Results  Component Value Date   ALKPHOS 35 (L) 03/26/2024   ALKPHOS 26 (L) 12/20/2023   ALKPHOS 39 05/31/2022    ASSESSMENT / PLAN  1. Age-related osteoporosis without current pathological fracture   2. Chronic systolic heart failure (HCC)   3. Hypothyroidism, adult   4. History of primary hyperparathyroidism   5. History of parathyroid  surgery   6. Vitamin D  deficiency   7. Hyponatremia     # Osteoporosis -Patient was started on alendronate  70 mg weekly in December 2020. -DEXA scan in August 2025 relatively stable bone mineral density, consistent with osteoporosis. -She will complete 5 years of treatment with Fosamax  70 mg weekly by the end of this year. -Recent vitamin D  level normal /acceptable.  Continue current vitamin D  supplement supplement. -She is currently not taking calcium , advised patient to take calcium  500 mg 1 tablet daily in the evening.  She also takes dietary calcium  in the form of cheese and occasional milk. -Discussed about fall precautions.  Discussed about weightbearing exercise as tolerated. - Stop Fosamax , will start drug holiday.  Asked to complete what she has and no more refills. -Next DEXA scan in follow-up 2027.  # Patient has history of primary hyperparathyroidism status post right superior parathyroidectomy in July 2022.  # Hypothyroidism -Recent TSH  mildly elevated.  She has been on levothyroxine  37.5 mcg daily.  She had normal TSH on the same dose in August. -I would like to recheck thyroid  function test TSH, free T4 today.  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 -Advised for limiting excessive intake of fluids for example water and juice. -Patient reports she had salt tablet in the past however not taking lately due to heart problem. - Stable serum sodium, continue to monitor.  # Order placed for digoxin  level, as planned by cardiology, will route the result.   Pam [name] was seen today for medical management of chronic issues.  Diagnoses and all orders for this visit:  Age-related osteoporosis without current pathological fracture -     Renal function panel  Chronic systolic heart failure (HCC) -     Cancel: Digoxin  level -     Digoxin  level -     Digoxin  level  Hypothyroidism, adult -     T4, free -     TSH -     T4, free -     TSH  History of primary hyperparathyroidism -     Renal function panel  History of parathyroid  surgery  Vitamin D  deficiency  Hyponatremia -     Renal function panel    DISPOSITION Follow up in clinic in 4 months suggested.  Labs few days prior to follow-up visit as ordered.  All questions answered and patient verbalized understanding of the plan.  Pam Gathright, MD [facility] [address] [place], [address] [place], [place] [address] Phone # [phone]  At least part of this note was generated using voice recognition software. Inadvertent word errors may have occurred, which were not recognized during the proofreading process.

## 2024-06-04 LAB — DIGOXIN LEVEL: Digoxin Level: 0.5 ug/L — ABNORMAL LOW (ref 0.8–2.0)

## 2024-06-10 ENCOUNTER — Ambulatory Visit (HOSPITAL_COMMUNITY): Payer: Self-pay | Admitting: Family Medicine

## 2024-06-11 ENCOUNTER — Encounter: Payer: Self-pay | Admitting: Cardiovascular Disease

## 2024-06-11 ENCOUNTER — Ambulatory Visit: Attending: Cardiovascular Disease | Admitting: Cardiovascular Disease

## 2024-06-11 VITALS — BP 114/58 | HR 51 | Ht 63.0 in | Wt 110.8 lb

## 2024-06-11 DIAGNOSIS — I5022 Chronic systolic (congestive) heart failure: Secondary | ICD-10-CM

## 2024-06-11 DIAGNOSIS — E785 Hyperlipidemia, unspecified: Secondary | ICD-10-CM

## 2024-06-11 DIAGNOSIS — I739 Peripheral vascular disease, unspecified: Secondary | ICD-10-CM

## 2024-06-11 DIAGNOSIS — I2510 Atherosclerotic heart disease of native coronary artery without angina pectoris: Secondary | ICD-10-CM

## 2024-06-11 DIAGNOSIS — R0989 Other specified symptoms and signs involving the circulatory and respiratory systems: Secondary | ICD-10-CM | POA: Diagnosis not present

## 2024-06-11 LAB — CBC

## 2024-06-11 NOTE — H&P (View-Only) (Signed)
 Cardiology Office Note   Date:  06/13/2024   ID:  Pam [name], DOB 04-30-1951, MRN [id number]  PCP:  Andrew Truman GRADE., MD  Cardiologist: Dr. Verlin.  No chief complaint on file.     History of Present Illness: Pam [name] is a 73 y.o. female who was referred by Dr. Verlin for evaluation management of peripheral arterial disease. She has known history of chronic systolic heart failure due to ischemic cardiomyopathy status post ICD placement, coronary artery disease with CTO of the mid LAD, hyperlipidemia and previous tobacco use.  She quit smoking in 2017.  She reports severe bilateral leg claudication mostly in the calf area that started about 4 to 5 months ago and has been progressive since then.  This is now happening with minimal exertion even walking in her driveway and forces her to stop and rest for few minutes before she can resume.  In addition, she started having symptoms at rest at night when she is trying to sleep.  No lower extremity ulceration. She does reports chronic exertional dyspnea but minimal episodes of chest pain.  She underwent noninvasive vascular studies in October which showed an ABI of 0.6 on the right and 0.7 on the left.  Duplex on the right showed significant stenosis in the mid external iliac artery and the proximal portion of the common femoral artery.  On the left, she was noted to have monophasic waveforms in the external iliac artery with no infrainguinal disease.  Past Medical History:  Diagnosis Date   Abnormal EKG    AICD (automatic cardioverter/defibrillator) present    Alcoholic intoxication without complication    Allergy    Cardiomyopathy, ischemic    Cataract    Chest pain 08/29/2016   Chest pain 08/29/2016   CHF (congestive heart failure) (HCC)    Chronic systolic heart failure (HCC) 08/29/2016   Facial laceration    Fall 07/07/2016   Hyperlipidemia    Hypertension 03/03/2017   Hyponatremia    Hypothyroidism 07/08/2016    NSTEMI (non-ST elevated myocardial infarction) (HCC)    Syncope    Thyroid  disease     Past Surgical History:  Procedure Laterality Date   CARDIAC CATHETERIZATION N/A 07/08/2016   Procedure: Left Heart Cath and Coronary Angiography;  Surgeon: Lonni Hanson, MD;  Location: [facility] INVASIVE CV LAB;  Service: Cardiovascular;  Laterality: N/A;   CARDIAC CATHETERIZATION N/A 08/29/2016   Procedure: Coronary Atherectomy;  Surgeon: Lonni Hanson, MD;  Location: [facility] INVASIVE CV LAB;  Service: Cardiovascular;  Laterality: N/A;   CARDIAC CATHETERIZATION N/A 08/29/2016   Procedure: Coronary Stent Intervention;  Surgeon: Lonni Hanson, MD;  Location: [facility] INVASIVE CV LAB;  Service: Cardiovascular;  Laterality: N/A;   CATARACT EXTRACTION, BILATERAL     COLONOSCOPY WITH PROPOFOL  N/A 11/18/2021   Procedure: COLONOSCOPY WITH PROPOFOL ;  Surgeon: Eda Iha, MD;  Location: [facility] ENDOSCOPY;  Service: Gastroenterology;  Laterality: N/A;   CORONARY ANGIOPLASTY     coronary stents      ICD IMPLANT N/A 08/28/2019   Procedure: ICD IMPLANT;  Surgeon: Inocencio Soyla Lunger, MD;  Location: [facility] INVASIVE CV LAB;  Service: Cardiovascular;  Laterality: N/A;   LEFT HEART CATH AND CORONARY ANGIOGRAPHY N/A 04/29/2022   Procedure: LEFT HEART CATH AND CORONARY ANGIOGRAPHY;  Surgeon: Anner Alm ORN, MD;  Location: [facility] INVASIVE CV LAB;  Service: Cardiovascular;  Laterality: N/A;   PARATHYROIDECTOMY Right 02/02/2021   Procedure: RIGHT SUPERIOR PARATHYROIDECTOMY;  Surgeon: Eletha Boas, MD;  Location: [facility] ORS;  Service: General;  Laterality: Right;   POLYPECTOMY  11/18/2021   Procedure: POLYPECTOMY;  Surgeon: Eda Iha, MD;  Location: [facility]OSCOPY;  Service: Gastroenterology;;   RIGHT HEART CATH N/A 08/11/2022   Procedure: RIGHT HEART CATH;  Surgeon: Gardenia Led, DO;  Location: [facility] INVASIVE CV LAB;  Service: Cardiovascular;  Laterality: N/A;   VAGINAL HYSTERECTOMY       Current Outpatient Medications  Medication  Sig Dispense Refill   acetaminophen  (TYLENOL ) 500 MG tablet Take 1,000 mg by mouth every 6 (six) hours as needed for moderate pain.     albuterol  (VENTOLIN  HFA) 108 (90 Base) MCG/ACT inhaler Inhale 2 puffs into the lungs every 6 (six) hours as needed for wheezing or shortness of breath. 6.7 g 3   atorvastatin  (LIPITOR ) 80 MG tablet TAKE 1 TABLET(80 MG) BY MOUTH DAILY 90 tablet 1   bisoprolol  (ZEBETA ) 5 MG tablet Take 0.5 tablets (2.5 mg total) by mouth daily. (Patient taking differently: Take 5 mg by mouth daily.)     Cholecalciferol (VITAMIN D3) 50 MCG (2000 UT) TABS Take 2,000 mcg by mouth daily.     clopidogrel  (PLAVIX ) 75 MG tablet TAKE 1 TABLET(75 MG) BY MOUTH DAILY WITH BREAKFAST 90 tablet 2   digoxin  (LANOXIN ) 0.125 MG tablet Take 0.5 tablets (0.0625 mg total) by mouth daily. 45 tablet 3   furosemide  (LASIX ) 20 MG tablet TAKE 1 TABLET BY MOUTH AS NEEDED 30 tablet 10   levothyroxine  (SYNTHROID ) 25 MCG tablet Take 37.5 mcg by mouth daily before breakfast. 180 tablet 1   nitroGLYCERIN  (NITROSTAT ) 0.4 MG SL tablet Place 1 tablet (0.4 mg total) under the tongue every 5 (five) minutes x 3 doses as needed for chest pain. 75 tablet 3   nystatin cream (MYCOSTATIN) Apply 1 Application topically 2 (two) times daily as needed (rash).     ranolazine  (RANEXA ) 1000 MG SR tablet TAKE 1 TABLET(1000 MG) BY MOUTH TWICE DAILY 180 tablet 3   sacubitril-valsartan (ENTRESTO ) 49-51 MG Take 1 tablet by mouth 2 (two) times daily. 60 tablet 3   spironolactone  (ALDACTONE ) 25 MG tablet TAKE 1 TABLET(25 MG) BY MOUTH DAILY 90 tablet 3   umeclidinium-vilanterol (ANORO ELLIPTA ) 62.5-25 MCG/ACT AEPB Inhale 1 puff into the lungs daily. 60 each 11   valACYclovir  (VALTREX ) 1000 MG tablet Take 1,000 mg by mouth daily.     CALCIUM  PO Take 1 tablet by mouth daily at 12 noon.     No current facility-administered medications for this visit.    Allergies:   Lisinopril     Social History:  The patient  reports that she quit  smoking about 7 years ago. Her smoking use included cigarettes. She started smoking about 57 years ago. She has a 50 pack-year smoking history. She has never used smokeless tobacco. She reports that she does not currently use alcohol. She reports that she does not use drugs.   Family History:  The patient's family history includes Heart disease in her mother and sister; Kidney failure in her mother.    ROS:  Please see the history of present illness.   Otherwise, review of systems are positive for none.   All other systems are reviewed and negative.    PHYSICAL EXAM: VS:  BP (!) 114/58   Pulse (!) 51   Ht 5' 3 (1.6 m)   Wt 110 lb 12.8 oz (50.3 kg)   SpO2 98%   BMI 19.63 kg/m  , BMI Body mass index is 19.63 kg/m. GEN: Well nourished, well developed, in no acute  distress  HEENT: normal  Neck: no JVD,  or masses.  Right carotid bruit Cardiac: RRR; no murmurs, rubs, or gallops,no edema  Respiratory:  clear to auscultation bilaterally, normal work of breathing GI: soft, nontender, nondistended, + BS MS: no deformity or atrophy  Skin: warm and dry, no rash Neuro:  Strength and sensation are intact Psych: euthymic mood, full affect Vascular: Radial pulses +1 bilaterally.  Femoral pulses barely palpable bilaterally slightly stronger on the left side.  Distal pulses are not palpable.   EKG:  EKG is not ordered today.    Recent Labs: 03/26/2024: ALT 14 05/29/2024: B Natriuretic Peptide 841.1 05/30/2024: TSH 4.84 06/11/2024: BUN 15; Creatinine, Ser 1.04; Hemoglobin 13.6; Platelets 335; Potassium 5.2; Sodium 132    Lipid Panel    Component Value Date/Time   CHOL 153 03/01/2018 0801   TRIG 41 03/01/2018 0801   HDL 93 03/01/2018 0801   CHOLHDL 1.6 03/01/2018 0801   CHOLHDL 2.0 07/07/2016 1914   VLDL 17 07/07/2016 1914   LDLCALC 52 03/01/2018 0801      Wt Readings from Last 3 Encounters:  06/11/24 110 lb 12.8 oz (50.3 kg)  06/03/24 109 lb (49.4 kg)  05/29/24 109 lb (49.4 kg)           06/07/2024    4:27 PM  PAD Screen  Previous PAD dx? No  Previous surgical procedure? No  Pain with walking? Yes  Subsides with rest? Yes  Feet/toe relief with dangling? Yes  Painful, non-healing ulcers? No  Extremities discolored? No      ASSESSMENT AND PLAN:  1.  Peripheral arterial disease: Rutherford class IV with severe claudication and rest pain at night.  No lower extremity ulceration.  Given severity of her symptoms, I recommend proceeding with abdominal aortogram with lower extremity angiography and possible endovascular intervention.  Planned access is via the left common femoral artery.  2.  Coronary artery disease involving native coronary arteries without angina: She is doing reasonably well and takes sublingual nitroglycerin  only rarely.  3.  Chronic systolic heart failure: She is on optimal medical therapy and appears to be euvolemic.  4.  Hyperlipidemia: Currently on atorvastatin  80 mg daily.  5.  Right carotid bruit: I requested carotid Doppler.     Disposition:   Proceed with lower extremity angiography and follow-up after.  Signed,  Deatrice Cage, MD  06/13/2024 4:00 PM    [facility]

## 2024-06-11 NOTE — Patient Instructions (Signed)
 Medication Instructions:  No changes *If you need a refill on your cardiac medications before your next appointment, please call your pharmacy*  Lab Work: Your provider would like for you to have the following labs today: CBC, BMET  If you have labs (blood work) drawn today and your tests are completely normal, you will receive your results only by: MyChart Message (if you have MyChart) OR A paper copy in the mail If you have any lab test that is abnormal or we need to change your treatment, we will call you to review the results.  Testing/Procedures: Your physician has requested that you have a carotid duplex. This test is an ultrasound of the carotid arteries in your neck. It looks at blood flow through these arteries that supply the brain with blood. Allow one hour for this exam. There are no restrictions or special instructions. This will take place at [address] 4th floor  Please note: We ask at that you not bring children with you during ultrasound (echo/ vascular) testing. Due to room size and safety concerns, children are not allowed in the ultrasound rooms during exams. Our front office staff cannot provide observation of children in our lobby area while testing is being conducted. An adult accompanying a patient to their appointment will only be allowed in the ultrasound room at the discretion of the ultrasound technician under special circumstances. We apologize for any inconvenience.  Follow-Up: At [facility], you and your health needs are our priority.  As part of our continuing mission to provide you with exceptional heart care, our providers are all part of one team.  This team includes your primary Cardiologist (physician) and Advanced Practice Providers or APPs (Physician Assistants and Nurse Practitioners) who all work together to provide you with the care you need, when you need it.  Your next appointment:   Keep your post procedure follow up on 07/16/24 at  3:35 pm  We recommend signing up for the patient portal called MyChart.  Sign up information is provided on this After Visit Summary.  MyChart is used to connect with patients for Virtual Visits (Telemedicine).  Patients are able to view lab/test results, encounter notes, upcoming appointments, etc.  Non-urgent messages can be sent to your provider as well.   To learn more about what you can do with MyChart, go to [email or web address].   Other Instructions  [facility] A DEPT OF [place] AT [address] A DEPT OF THE [place] [address] [place] [address] Dept: [phone] Loc: [phone]  Lynnlee Marchena  06/11/2024  You are scheduled for a Peripheral Angiogram on Wednesday, November 19 with Dr. Deatrice Cage.  1. Please arrive at the [facility] (Main Entrance A) at [facility]: [address] [place], [place] [address] at 6:30 AM (This time is 2 hour(s) before your procedure to ensure your preparation).   Free valet parking service is available. You will check in at ADMITTING. The support person will be asked to wait in the waiting room.  It is OK to have someone drop you off and come back when you are ready to be discharged.    Special note: Every effort is made to have your procedure done on time. Please understand that emergencies sometimes delay scheduled procedures.  2. Diet: Nothing to eat after midnight.   3. Hydration: You need to be well hydrated before your procedure. On November 19, you may drink  approved liquids (see below) until 2 hours before the procedure, with 16 oz of water as your last intake.   List of approved liquids water, clear juice, clear tea, black coffee, fruit juices, non-citric and without pulp, carbonated beverages, Gatorade, Kool -Aid, plain Jello-O and plain ice popsicles.  4. Labs: You will need to have blood drawn on You do not need to be fasting.  5. Medication instructions  in preparation for your procedure: Hold the Spironolactone /furosemide  the morning of the procedure  On the morning of your procedure, take your Aspirin  81 mg and any morning medicines NOT listed above.  You may use sips of water.  6. Plan to go home the same day, you will only stay overnight if medically necessary. 7. Bring a current list of your medications and current insurance cards. 8. You MUST have a responsible person to drive you home. 9. Someone MUST be with you the first 24 hours after you arrive home or your discharge will be delayed. 10. Please wear clothes that are easy to get on and off and wear slip-on shoes.  Thank you for allowing us  to care for you!   -- [facility]

## 2024-06-11 NOTE — Progress Notes (Unsigned)
 Cardiology Office Note   Date:  06/13/2024   ID:  [name], DOB 04-30-1951, MRN [id number]  PCP:  Andrew Truman GRADE., MD  Cardiologist: Dr. Verlin.  No chief complaint on file.     History of Present Illness: Pam [name] is a 73 y.o. female who was referred by Dr. Verlin for evaluation management of peripheral arterial disease. She has known history of chronic systolic heart failure due to ischemic cardiomyopathy status post ICD placement, coronary artery disease with CTO of the mid LAD, hyperlipidemia and previous tobacco use.  She quit smoking in 2017.  She reports severe bilateral leg claudication mostly in the calf area that started about 4 to 5 months ago and has been progressive since then.  This is now happening with minimal exertion even walking in her driveway and forces her to stop and rest for few minutes before she can resume.  In addition, she started having symptoms at rest at night when she is trying to sleep.  No lower extremity ulceration. She does reports chronic exertional dyspnea but minimal episodes of chest pain.  She underwent noninvasive vascular studies in October which showed an ABI of 0.6 on the right and 0.7 on the left.  Duplex on the right showed significant stenosis in the mid external iliac artery and the proximal portion of the common femoral artery.  On the left, she was noted to have monophasic waveforms in the external iliac artery with no infrainguinal disease.  Past Medical History:  Diagnosis Date   Abnormal EKG    AICD (automatic cardioverter/defibrillator) present    Alcoholic intoxication without complication    Allergy    Cardiomyopathy, ischemic    Cataract    Chest pain 08/29/2016   Chest pain 08/29/2016   CHF (congestive heart failure) (HCC)    Chronic systolic heart failure (HCC) 08/29/2016   Facial laceration    Fall 07/07/2016   Hyperlipidemia    Hypertension 03/03/2017   Hyponatremia    Hypothyroidism 07/08/2016    NSTEMI (non-ST elevated myocardial infarction) (HCC)    Syncope    Thyroid  disease     Past Surgical History:  Procedure Laterality Date   CARDIAC CATHETERIZATION N/A 07/08/2016   Procedure: Left Heart Cath and Coronary Angiography;  Surgeon: Lonni Hanson, MD;  Location: [facility] INVASIVE CV LAB;  Service: Cardiovascular;  Laterality: N/A;   CARDIAC CATHETERIZATION N/A 08/29/2016   Procedure: Coronary Atherectomy;  Surgeon: Lonni Hanson, MD;  Location: [facility] INVASIVE CV LAB;  Service: Cardiovascular;  Laterality: N/A;   CARDIAC CATHETERIZATION N/A 08/29/2016   Procedure: Coronary Stent Intervention;  Surgeon: Lonni Hanson, MD;  Location: [facility] INVASIVE CV LAB;  Service: Cardiovascular;  Laterality: N/A;   CATARACT EXTRACTION, BILATERAL     COLONOSCOPY WITH PROPOFOL  N/A 11/18/2021   Procedure: COLONOSCOPY WITH PROPOFOL ;  Surgeon: Eda Iha, MD;  Location: [facility] ENDOSCOPY;  Service: Gastroenterology;  Laterality: N/A;   CORONARY ANGIOPLASTY     coronary stents      ICD IMPLANT N/A 08/28/2019   Procedure: ICD IMPLANT;  Surgeon: Inocencio Soyla Lunger, MD;  Location: [facility] INVASIVE CV LAB;  Service: Cardiovascular;  Laterality: N/A;   LEFT HEART CATH AND CORONARY ANGIOGRAPHY N/A 04/29/2022   Procedure: LEFT HEART CATH AND CORONARY ANGIOGRAPHY;  Surgeon: Anner Alm ORN, MD;  [facility] CV LAB;  Service: Cardiovascular;  Laterality: N/A;   PARATHYROIDECTOMY Right 02/02/2021   Procedure: RIGHT SUPERIOR PARATHYROIDECTOMY;  Surgeon: Eletha Boas, MD;  Location: [facility] ORS;  Service: General;  Laterality: Right;   POLYPECTOMY  11/18/2021   Procedure: POLYPECTOMY;  Surgeon: Eda Iha, MD;  Location: [facility];  Service: Gastroenterology;;   RIGHT HEART CATH N/A 08/11/2022   Procedure: RIGHT HEART CATH;  Surgeon: Gardenia Led, DO;  Location: [facility] INVASIVE CV LAB;  Service: Cardiovascular;  Laterality: N/A;   VAGINAL HYSTERECTOMY       Current Outpatient Medications  Medication  Sig Dispense Refill   acetaminophen  (TYLENOL ) 500 MG tablet Take 1,000 mg by mouth every 6 (six) hours as needed for moderate pain.     albuterol  (VENTOLIN  HFA) 108 (90 Base) MCG/ACT inhaler Inhale 2 puffs into the lungs every 6 (six) hours as needed for wheezing or shortness of breath. 6.7 g 3   atorvastatin  (LIPITOR ) 80 MG tablet TAKE 1 TABLET(80 MG) BY MOUTH DAILY 90 tablet 1   bisoprolol  (ZEBETA ) 5 MG tablet Take 0.5 tablets (2.5 mg total) by mouth daily. (Patient taking differently: Take 5 mg by mouth daily.)     Cholecalciferol (VITAMIN D3) 50 MCG (2000 UT) TABS Take 2,000 mcg by mouth daily.     clopidogrel  (PLAVIX ) 75 MG tablet TAKE 1 TABLET(75 MG) BY MOUTH DAILY WITH BREAKFAST 90 tablet 2   digoxin  (LANOXIN ) 0.125 MG tablet Take 0.5 tablets (0.0625 mg total) by mouth daily. 45 tablet 3   furosemide  (LASIX ) 20 MG tablet TAKE 1 TABLET BY MOUTH AS NEEDED 30 tablet 10   levothyroxine  (SYNTHROID ) 25 MCG tablet Take 37.5 mcg by mouth daily before breakfast. 180 tablet 1   nitroGLYCERIN  (NITROSTAT ) 0.4 MG SL tablet Place 1 tablet (0.4 mg total) under the tongue every 5 (five) minutes x 3 doses as needed for chest pain. 75 tablet 3   nystatin cream (MYCOSTATIN) Apply 1 Application topically 2 (two) times daily as needed (rash).     ranolazine  (RANEXA ) 1000 MG SR tablet TAKE 1 TABLET(1000 MG) BY MOUTH TWICE DAILY 180 tablet 3   sacubitril-valsartan (ENTRESTO ) 49-51 MG Take 1 tablet by mouth 2 (two) times daily. 60 tablet 3   spironolactone  (ALDACTONE ) 25 MG tablet TAKE 1 TABLET(25 MG) BY MOUTH DAILY 90 tablet 3   umeclidinium-vilanterol (ANORO ELLIPTA ) 62.5-25 MCG/ACT AEPB Inhale 1 puff into the lungs daily. 60 each 11   valACYclovir  (VALTREX ) 1000 MG tablet Take 1,000 mg by mouth daily.     CALCIUM  PO Take 1 tablet by mouth daily at 12 noon.     No current facility-administered medications for this visit.    Allergies:   Lisinopril     Social History:  The patient  reports that she quit  smoking about 7 years ago. Her smoking use included cigarettes. She started smoking about 57 years ago. She has a 50 pack-year smoking history. She has never used smokeless tobacco. She reports that she does not currently use alcohol. She reports that she does not use drugs.   Family History:  The patient's family history includes Heart disease in her mother and sister; Kidney failure in her mother.    ROS:  Please see the history of present illness.   Otherwise, review of systems are positive for none.   All other systems are reviewed and negative.    PHYSICAL EXAM: VS:  BP (!) 114/58   Pulse (!) 51   Ht 5' 3 (1.6 m)   Wt 110 lb 12.8 oz (50.3 kg)   SpO2 98%   BMI 19.63 kg/m  , BMI Body mass index is 19.63 kg/m. GEN: Well nourished, well developed, in no acute  distress  HEENT: normal  Neck: no JVD,  or masses.  Right carotid bruit Cardiac: RRR; no murmurs, rubs, or gallops,no edema  Respiratory:  clear to auscultation bilaterally, normal work of breathing GI: soft, nontender, nondistended, + BS MS: no deformity or atrophy  Skin: warm and dry, no rash Neuro:  Strength and sensation are intact Psych: euthymic mood, full affect Vascular: Radial pulses +1 bilaterally.  Femoral pulses barely palpable bilaterally slightly stronger on the left side.  Distal pulses are not palpable.   EKG:  EKG is not ordered today.    Recent Labs: 03/26/2024: ALT 14 05/29/2024: B Natriuretic Peptide 841.1 05/30/2024: TSH 4.84 06/11/2024: BUN 15; Creatinine, Ser 1.04; Hemoglobin 13.6; Platelets 335; Potassium 5.2; Sodium 132    Lipid Panel    Component Value Date/Time   CHOL 153 03/01/2018 0801   TRIG 41 03/01/2018 0801   HDL 93 03/01/2018 0801   CHOLHDL 1.6 03/01/2018 0801   CHOLHDL 2.0 07/07/2016 1914   VLDL 17 07/07/2016 1914   LDLCALC 52 03/01/2018 0801      Wt Readings from Last 3 Encounters:  06/11/24 110 lb 12.8 oz (50.3 kg)  06/03/24 109 lb (49.4 kg)  05/29/24 109 lb (49.4 kg)           06/07/2024    4:27 PM  PAD Screen  Previous PAD dx? No  Previous surgical procedure? No  Pain with walking? Yes  Subsides with rest? Yes  Feet/toe relief with dangling? Yes  Painful, non-healing ulcers? No  Extremities discolored? No      ASSESSMENT AND PLAN:  1.  Peripheral arterial disease: Rutherford class IV with severe claudication and rest pain at night.  No lower extremity ulceration.  Given severity of her symptoms, I recommend proceeding with abdominal aortogram with lower extremity angiography and possible endovascular intervention.  Planned access is via the left common femoral artery.  2.  Coronary artery disease involving native coronary arteries without angina: She is doing reasonably well and takes sublingual nitroglycerin  only rarely.  3.  Chronic systolic heart failure: She is on optimal medical therapy and appears to be euvolemic.  4.  Hyperlipidemia: Currently on atorvastatin  80 mg daily.  5.  Right carotid bruit: I requested carotid Doppler.     Disposition:   Proceed with lower extremity angiography and follow-up after.  Signed,  Deatrice Cage, MD  06/13/2024 4:00 PM    [facility]

## 2024-06-12 ENCOUNTER — Ambulatory Visit: Payer: Self-pay | Admitting: *Deleted

## 2024-06-12 LAB — CBC
Hematocrit: 39 % (ref 34.0–46.6)
Hemoglobin: 13.6 g/dL (ref 11.1–15.9)
MCH: 35.5 pg — AB (ref 26.6–33.0)
MCHC: 34.9 g/dL (ref 31.5–35.7)
MCV: 102 fL — AB (ref 79–97)
Platelets: 335 x10E3/uL (ref 150–450)
RBC: 3.83 x10E6/uL (ref 3.77–5.28)
RDW: 13.1 % (ref 11.7–15.4)
WBC: 6.5 x10E3/uL (ref 3.4–10.8)

## 2024-06-12 LAB — BASIC METABOLIC PANEL WITH GFR
BUN/Creatinine Ratio: 14 (ref 12–28)
BUN: 15 mg/dL (ref 8–27)
CO2: 23 mmol/L (ref 20–29)
Calcium: 9.9 mg/dL (ref 8.7–10.3)
Chloride: 95 mmol/L — ABNORMAL LOW (ref 96–106)
Creatinine, Ser: 1.04 mg/dL — ABNORMAL HIGH (ref 0.57–1.00)
Glucose: 84 mg/dL (ref 70–99)
Potassium: 5.2 mmol/L (ref 3.5–5.2)
Sodium: 132 mmol/L — ABNORMAL LOW (ref 134–144)
eGFR: 57 mL/min/1.73 — ABNORMAL LOW (ref >59)

## 2024-06-13 ENCOUNTER — Other Ambulatory Visit (HOSPITAL_COMMUNITY): Payer: Self-pay | Admitting: Cardiology

## 2024-06-13 MED ORDER — NITROGLYCERIN 0.4 MG SL SUBL: 0.4000 mg | SUBLINGUAL_TABLET | SUBLINGUAL | 3 refills | Status: DC | PRN

## 2024-06-14 ENCOUNTER — Telehealth: Payer: Self-pay | Admitting: Cardiovascular Disease

## 2024-06-14 MED ORDER — NITROGLYCERIN 0.4 MG SL SUBL: 0.4000 mg | SUBLINGUAL_TABLET | SUBLINGUAL | 3 refills | Status: DC | PRN

## 2024-06-14 NOTE — Telephone Encounter (Signed)
 Refill sent.

## 2024-06-14 NOTE — Telephone Encounter (Signed)
*  STAT* If patient is at the pharmacy, call can be transferred to refill team.   1. Which medications need to be refilled? (please list name of each medication and dose if known)   nitroGLYCERIN  (NITROSTAT ) 0.4 MG SL tablet     2. Would you like to learn more about the convenience, safety, & potential cost savings by using the [facility]? No    3. Are you open to using the [facility] (Type [facility]. ). no   4. Which pharmacy/location (including street and city if local pharmacy) is medication to be sent to?[id number] - [place], [place] - [address]    5. Do they need a 30 day or 90 day supply? 90 day    Pt is out of medication

## 2024-06-18 ENCOUNTER — Telehealth: Payer: Self-pay | Admitting: *Deleted

## 2024-06-18 NOTE — Telephone Encounter (Signed)
 Lower Extremity Angiogram scheduled at [facility] for: Wednesday June 19, 2024 8:30 AM Arrival time [facility] Main Entrance A at: 6:30 AM  Diet: -Nothing to eat after midnight.  Hydration: -May drink clear liquids until 2 hours before the procedure.  Approved liquids: Water, clear tea, black coffee, fruit juices-non-citric and without pulp,Gatorade, plain Jello/popsicles.   -Please drink 16 oz of water 2 hours before procedure.  Medication instructions: -Hold:  Entresto /Spironolactone /Lasix -PM prior to procedure and AM of procedure-per protocol GFR < 60 (57) -Other usual morning medications can be taken including aspirin  81 mg and Plavix  75 mg.  Plan to go home the same day, you will only stay overnight if medically necessary.  You must have responsible adult to drive you home.  Someone must be with you the first 24 hours after you arrive home.  Reviewed procedure instructions with patient.

## 2024-06-19 ENCOUNTER — Ambulatory Visit (HOSPITAL_COMMUNITY)
Admission: RE | Admit: 2024-06-19 | Discharge: 2024-06-19 | Disposition: A | Discharge status: Home / Self Care | Attending: Cardiovascular Disease | Admitting: Cardiovascular Disease

## 2024-06-19 ENCOUNTER — Other Ambulatory Visit: Payer: Self-pay

## 2024-06-19 ENCOUNTER — Encounter (HOSPITAL_COMMUNITY): Admission: RE | Disposition: A | Payer: Self-pay | Source: Home / Self Care | Attending: Cardiovascular Disease

## 2024-06-19 DIAGNOSIS — Z79899 Other long term (current) drug therapy: Secondary | ICD-10-CM | POA: Insufficient documentation

## 2024-06-19 DIAGNOSIS — E785 Hyperlipidemia, unspecified: Secondary | ICD-10-CM | POA: Diagnosis not present

## 2024-06-19 DIAGNOSIS — Z7902 Long term (current) use of antithrombotics/antiplatelets: Secondary | ICD-10-CM | POA: Insufficient documentation

## 2024-06-19 DIAGNOSIS — Z87891 Personal history of nicotine dependence: Secondary | ICD-10-CM | POA: Insufficient documentation

## 2024-06-19 DIAGNOSIS — I252 Old myocardial infarction: Secondary | ICD-10-CM | POA: Insufficient documentation

## 2024-06-19 DIAGNOSIS — I5022 Chronic systolic (congestive) heart failure: Secondary | ICD-10-CM | POA: Diagnosis not present

## 2024-06-19 DIAGNOSIS — I70213 Atherosclerosis of native arteries of extremities with intermittent claudication, bilateral legs: Secondary | ICD-10-CM | POA: Insufficient documentation

## 2024-06-19 DIAGNOSIS — R0989 Other specified symptoms and signs involving the circulatory and respiratory systems: Secondary | ICD-10-CM | POA: Diagnosis not present

## 2024-06-19 DIAGNOSIS — I700 Atherosclerosis of aorta: Secondary | ICD-10-CM | POA: Insufficient documentation

## 2024-06-19 DIAGNOSIS — I2510 Atherosclerotic heart disease of native coronary artery without angina pectoris: Secondary | ICD-10-CM | POA: Diagnosis not present

## 2024-06-19 DIAGNOSIS — Z95810 Presence of automatic (implantable) cardiac defibrillator: Secondary | ICD-10-CM | POA: Insufficient documentation

## 2024-06-19 DIAGNOSIS — I739 Peripheral vascular disease, unspecified: Secondary | ICD-10-CM

## 2024-06-19 DIAGNOSIS — I110 Hypertensive heart disease with heart failure: Secondary | ICD-10-CM | POA: Insufficient documentation

## 2024-06-19 HISTORY — PX: LOWER EXTREMITY ANGIOGRAPHY: CATH118251

## 2024-06-19 MED ORDER — FENTANYL CITRATE (PF) 100 MCG/2ML IJ SOLN
INTRAMUSCULAR | Status: AC
  Filled 2024-06-19: qty 2

## 2024-06-19 MED ORDER — HEPARIN (PORCINE) IN NACL 1000-0.9 UT/500ML-% IV SOLN
INTRAVENOUS | Status: DC | PRN
  Administered 2024-06-19 (×2): 500 mL

## 2024-06-19 MED ORDER — LIDOCAINE HCL (PF) 1 % IJ SOLN
INTRAMUSCULAR | Status: DC | PRN
  Administered 2024-06-19 (×2): 20 mL

## 2024-06-19 MED ORDER — FENTANYL CITRATE (PF) 100 MCG/2ML IJ SOLN
INTRAMUSCULAR | Status: DC | PRN
  Administered 2024-06-19: 25 ug via INTRAVENOUS
  Administered 2024-06-19: 25 ug via INTRAMUSCULAR
  Administered 2024-06-19: 25 ug via INTRAVENOUS

## 2024-06-19 MED ORDER — IODIXANOL 320 MG/ML IV SOLN
INTRAVENOUS | Status: DC | PRN
  Administered 2024-06-19: 85 mL

## 2024-06-19 MED ORDER — SODIUM CHLORIDE 0.9% FLUSH: 3.0000 mL | INTRAVENOUS | Status: DC | PRN

## 2024-06-19 MED ORDER — SODIUM CHLORIDE 0.9 % IV SOLN: 250.0000 mL | INTRAVENOUS | Status: DC | PRN

## 2024-06-19 MED ORDER — ACETAMINOPHEN 325 MG PO TABS: 650.0000 mg | ORAL_TABLET | ORAL | Status: DC | PRN

## 2024-06-19 MED ORDER — MIDAZOLAM HCL 2 MG/2ML IJ SOLN
INTRAMUSCULAR | Status: AC
  Filled 2024-06-19: qty 2

## 2024-06-19 MED ORDER — LIDOCAINE HCL (PF) 1 % IJ SOLN
INTRAMUSCULAR | Status: AC
  Filled 2024-06-19: qty 30

## 2024-06-19 MED ORDER — FREE WATER: 500.0000 mL | Freq: Once | Status: DC

## 2024-06-19 MED ORDER — ONDANSETRON HCL 4 MG/2ML IJ SOLN: 4.0000 mg | Freq: Four times a day (QID) | INTRAMUSCULAR | Status: DC | PRN

## 2024-06-19 MED ORDER — SODIUM CHLORIDE 0.9 % IV SOLN: INTRAVENOUS | Status: AC

## 2024-06-19 MED ORDER — HYDRALAZINE HCL 20 MG/ML IJ SOLN: 5.0000 mg | INTRAMUSCULAR | Status: DC | PRN

## 2024-06-19 MED ORDER — ASPIRIN 81 MG PO CHEW: 81.0000 mg | CHEWABLE_TABLET | ORAL | Status: DC

## 2024-06-19 MED ORDER — SODIUM CHLORIDE 0.9% FLUSH: 3.0000 mL | Freq: Two times a day (BID) | INTRAVENOUS | Status: DC

## 2024-06-19 MED ORDER — LABETALOL HCL 5 MG/ML IV SOLN: 10.0000 mg | INTRAVENOUS | Status: DC | PRN

## 2024-06-19 MED ORDER — MIDAZOLAM HCL (PF) 2 MG/2ML IJ SOLN
INTRAMUSCULAR | Status: DC | PRN
  Administered 2024-06-19: 1 mg via INTRAVENOUS

## 2024-06-19 NOTE — Progress Notes (Signed)
 Patient and patient husband given discharge instructions, education provided no further questions at this time. Patient able to ambulate and void before discharge. Able to tolerate PO intake. Patient groin sites are clean, dry, intact with no hematoma noted upon discharge.

## 2024-06-19 NOTE — Progress Notes (Signed)
 Site area: Right femoral Site Prior to Removal:  Level 0 Pressure Applied For:25 minutes Manual:   Yes Patient Status During Pull:  Stable Post Pull Site:  Level 0 Post Pull Instructions Given:  Yes Post Pull Pulses Present: Right PT doppler Dressing Applied: gauze and tegaderm  Bedrest begins @ 1015 Comments:

## 2024-06-19 NOTE — Interval H&P Note (Signed)
 History and Physical Interval Note:  06/19/2024 8:43 AM  Pam [name]  has presented today for surgery, with the diagnosis of pad.  The various methods of treatment have been discussed with the patient and family. After consideration of risks, benefits and other options for treatment, the patient has consented to  Procedure(s): Lower Extremity Angiography (N/A) as a surgical intervention.  The patient's history has been reviewed, patient examined, no change in status, stable for surgery.  I have reviewed the patient's chart and labs.  Questions were answered to the patient's satisfaction.     Justin Buechner

## 2024-06-20 ENCOUNTER — Encounter: Payer: Self-pay | Admitting: Cardiology

## 2024-06-20 ENCOUNTER — Encounter (HOSPITAL_COMMUNITY): Payer: Self-pay | Admitting: Cardiovascular Disease

## 2024-06-20 ENCOUNTER — Telehealth: Payer: Self-pay | Admitting: *Deleted

## 2024-06-20 DIAGNOSIS — I5022 Chronic systolic (congestive) heart failure: Secondary | ICD-10-CM

## 2024-06-20 NOTE — Telephone Encounter (Signed)
 Pam Lonni BIRCH, MD  Darron Deatrice LABOR, MD; Lanis Fonda BRAVO, MD; Velinda Avelina CROME, RN Agree. Pat, Can we set her up for an echo? Diagnosis chronic systolic CHF. Thanks, Chris  Patient notified.  Order placed

## 2024-06-24 ENCOUNTER — Other Ambulatory Visit: Payer: Self-pay

## 2024-06-24 DIAGNOSIS — I771 Stricture of artery: Secondary | ICD-10-CM

## 2024-06-24 DIAGNOSIS — I700 Atherosclerosis of aorta: Secondary | ICD-10-CM

## 2024-06-26 ENCOUNTER — Other Ambulatory Visit (HOSPITAL_COMMUNITY): Payer: Self-pay

## 2024-06-26 MED ORDER — BISOPROLOL FUMARATE 5 MG PO TABS: 2.5000 mg | ORAL_TABLET | Freq: Every day | ORAL | 1 refills | Status: DC

## 2024-07-02 ENCOUNTER — Ambulatory Visit: Payer: Self-pay | Admitting: Cardiovascular Disease

## 2024-07-02 ENCOUNTER — Ambulatory Visit (HOSPITAL_COMMUNITY)
Admission: RE | Admit: 2024-07-02 | Discharge: 2024-07-02 | Disposition: A | Source: Ambulatory Visit | Attending: Cardiovascular Disease | Admitting: Cardiovascular Disease

## 2024-07-02 DIAGNOSIS — I2510 Atherosclerotic heart disease of native coronary artery without angina pectoris: Secondary | ICD-10-CM | POA: Diagnosis not present

## 2024-07-02 DIAGNOSIS — I358 Other nonrheumatic aortic valve disorders: Secondary | ICD-10-CM | POA: Diagnosis not present

## 2024-07-02 DIAGNOSIS — I5022 Chronic systolic (congestive) heart failure: Secondary | ICD-10-CM | POA: Insufficient documentation

## 2024-07-02 DIAGNOSIS — I071 Rheumatic tricuspid insufficiency: Secondary | ICD-10-CM | POA: Insufficient documentation

## 2024-07-02 LAB — ECHOCARDIOGRAM COMPLETE
AR max vel: 1.83 cm2
AV Area VTI: 1.87 cm2
AV Area mean vel: 2 cm2
AV Mean grad: 2 mmHg
AV Peak grad: 4.8 mmHg
Ao pk vel: 1.1 m/s
Area-P 1/2: 2.58 cm2
Calc EF: 48.3 %
S' Lateral: 2.6 cm
Single Plane A2C EF: 50.9 %
Single Plane A4C EF: 45.2 %

## 2024-07-02 MED ORDER — PERFLUTREN LIPID MICROSPHERE
1.0000 mL | INTRAVENOUS | Status: AC | PRN
  Administered 2024-07-02: 2 mL via INTRAVENOUS

## 2024-07-05 ENCOUNTER — Ambulatory Visit (HOSPITAL_COMMUNITY)
Admission: RE | Admit: 2024-07-05 | Discharge: 2024-07-05 | Disposition: A | Source: Ambulatory Visit | Attending: Vascular Surgery

## 2024-07-05 DIAGNOSIS — I771 Stricture of artery: Secondary | ICD-10-CM

## 2024-07-05 DIAGNOSIS — I700 Atherosclerosis of aorta: Secondary | ICD-10-CM

## 2024-07-05 MED ORDER — IOHEXOL 350 MG/ML SOLN
80.0000 mL | Freq: Once | INTRAVENOUS | Status: AC | PRN
  Administered 2024-07-05: 80 mL via INTRAVENOUS

## 2024-07-11 ENCOUNTER — Encounter: Payer: Self-pay | Admitting: Vascular Surgery

## 2024-07-11 ENCOUNTER — Ambulatory Visit (HOSPITAL_COMMUNITY)
Admission: RE | Admit: 2024-07-11 | Discharge: 2024-07-11 | Attending: Cardiovascular Disease | Admitting: Cardiovascular Disease

## 2024-07-11 ENCOUNTER — Ambulatory Visit: Admitting: Vascular Surgery

## 2024-07-11 VITALS — BP 118/59 | HR 57 | Temp 98.2°F | Resp 18 | Ht 63.0 in | Wt 109.9 lb

## 2024-07-11 DIAGNOSIS — I745 Embolism and thrombosis of iliac artery: Secondary | ICD-10-CM | POA: Insufficient documentation

## 2024-07-11 DIAGNOSIS — I739 Peripheral vascular disease, unspecified: Secondary | ICD-10-CM | POA: Insufficient documentation

## 2024-07-11 DIAGNOSIS — R0989 Other specified symptoms and signs involving the circulatory and respiratory systems: Secondary | ICD-10-CM | POA: Insufficient documentation

## 2024-07-11 DIAGNOSIS — I70221 Atherosclerosis of native arteries of extremities with rest pain, right leg: Secondary | ICD-10-CM | POA: Insufficient documentation

## 2024-07-11 NOTE — Progress Notes (Signed)
 Office Note     CC: Bilateral lower extremity rest pain Requesting Provider:  Andrew Truman GRADE., MD  HPI: Pam [name] is a 73 y.o. (February 14, 1951) female presenting at the request of .Andrew Truman GRADE., MD for right lower extremity rest pain.  On exam, Pam [name] was doing well.  Originally from [place] , she moved to [place]  years ago.  She is married with 2 children and 4 grandchildren.  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  Pam [name] is a former smoker, having quit in 2017 after NSTEMI-see below. Notes left lower extremity lifestyle-limiting claudication, right lower extremity rest pain which wakes her up at night and makes it very difficult to sleep.  Denies symptoms of chronic mesenteric ischemia.  Able to eat without issue.  Left sided defibrillator, surgical history includes hysterectomy  ___________________  Cardiac history includes: CAD, ischemic cardiomyopathy, chronic systolic CHF, HTN, HLD and hypothyroidism  - December 2017 NSTEMI with chronic occlusion of the mid LAD and severe, calcified stenosis in the proximal Circumflex.  LVEF was 30% by LV gram and 35-40% by echo.  - December 2017 cardiac MRI with non-viability of the anterior wall in the LAD territory. LVEF=38% by cardiac MRI. The Circumflex stenosis was treated with orbital atherectomy and stenting using a drug eluting stent in a staged procedure in January 2018.  - November 2020 Echo with LVEF=25-30%. Dilated LV. Mild MR. ICD placed in January 2021 and followed by Dr. Antonetta.  - September 2023 with acute on chronic CHF exacerbation. Cardiac cath September 2023 with stable CAD. - September 2023 Echo with LVEF=25-30%. No significant valve disease. She is now followed in our Advanced Heart failure clinic by Dr. Gardenia.  - January 2024 right heart cath with low right heart pressures but low cardiac output.   She was seen in the Advanced Heart Failure clinic in April 2025.   She does not wish to consider any advanced heart failure therapies.    The pt is  on a statin for cholesterol management.  The pt is  on a daily aspirin .   Other AC:  - The pt is  on medication for hypertension.   The pt is  diabetic.  Tobacco hx:    Past Medical History:  Diagnosis Date   Abnormal EKG    AICD (automatic cardioverter/defibrillator) present    Alcoholic intoxication without complication    Allergy    Cardiomyopathy, ischemic    Cataract    Chest pain 08/29/2016   Chest pain 08/29/2016   CHF (congestive heart failure) (HCC)    Chronic systolic heart failure (HCC) 08/29/2016   Facial laceration    Fall 07/07/2016   Hyperlipidemia    Hypertension 03/03/2017   Hyponatremia    Hypothyroidism 07/08/2016   NSTEMI (non-ST elevated myocardial infarction) (HCC)    Syncope    Thyroid  disease     Past Surgical History:  Procedure Laterality Date   CARDIAC CATHETERIZATION N/A 07/08/2016   Procedure: Left Heart Cath and Coronary Angiography;  Surgeon: Lonni Hanson, MD;  [facility] INV[facility] CV LAB;  Service: Cardiovascular;  Laterality: N/A;   CARDIAC CATHETERIZATION N/A 08/29/2016   Procedure: Coronary Atherectomy;  Surgeon: Lonni Hanson, MD;  Location: [facility] INVASIVE CV LAB;  Service: Cardiovascular;  Laterality: N/A;   CARDIAC CATHETERIZATION N/A 08/29/2016   Procedure: Coronary Stent Intervention;  Surgeon: Lonni Hanson, MD;  Location: [facility] INVASIVE CV LAB;  Service: Cardiovascular;  Laterality: N/A;   CATARACT EXTRACTION, BILATERAL  COLONOSCOPY WITH PROPOFOL  N/A 11/18/2021   Procedure: COLONOSCOPY WITH PROPOFOL ;  Surgeon: Eda Iha, MD;  Location: [facility];  Service: Gastroenterology;  Laterality: N/A;   CORONARY ANGIOPLASTY     coronary stents      ICD IMPLANT N/A 08/28/2019   Procedure: ICD IMPLANT;  Surgeon: Inocencio Soyla Lunger, MD;  Location: [facility] INVASIVE CV LAB;  Service: Cardiovascular;  Laterality: N/A;   LEFT HEART CATH AND CORONARY  ANGIOGRAPHY N/A 04/29/2022   Procedure: LEFT HEART CATH AND CORONARY ANGIOGRAPHY;  Surgeon: Anner Alm ORN, MD;  Location: [facility] INVASIVE CV LAB;  Service: Cardiovascular;  Laterality: N/A;   LOWER EXTREMITY ANGIOGRAPHY N/A 06/19/2024   Procedure: Lower Extremity Angiography;  Surgeon: Darron Deatrice LABOR, MD;  Location: [facility] INVASIVE CV LAB;  Service: Cardiovascular;  Laterality: N/A;   PARATHYROIDECTOMY Right 02/02/2021   Procedure: RIGHT SUPERIOR PARATHYROIDECTOMY;  Surgeon: Eletha Boas, MD;  Location: [facility];  Service: General;  Laterality: Right;   POLYPECTOMY  11/18/2021   Procedure: POLYPECTOMY;  Surgeon: Eda Iha, MD;  Location: [facility];  Service: Gastroenterology;;   RIGHT HEART CATH N/A 08/11/2022   Procedure: RIGHT HEART CATH;  Surgeon: Gardenia Led, DO;  Location: [facility] INVASIVE CV LAB;  Service: Cardiovascular;  Laterality: N/A;   VAGINAL HYSTERECTOMY      Social History   Socioeconomic History   Marital status: Married    Spouse name: Tefl Teacher   Number of children: 2   Years of education: Not on file   Highest education level: High school graduate  Occupational History   Occupation: Retired-worked for post office  Tobacco Use   Smoking status: Former    Current packs/day: 0.00    Average packs/day: 1 pack/day for 50.0 years (50.0 ttl pk-yrs)    Types: Cigarettes    Start date: 07/07/1966    Quit date: 07/07/2016    Years since quitting: 8.0   Smokeless tobacco: Never  Vaping Use   Vaping status: Never Used  Substance and Sexual Activity   Alcohol use: Not Currently   Drug use: Never   Sexual activity: Not Currently  Other Topics Concern   Not on file  Social History Narrative   Not on file   Social Drivers of Health   Tobacco Use: Medium Risk (07/03/2024)   Received from [facility]   Patient History    Smoking Tobacco Use: Former    Smokeless Tobacco Use: Never    Passive Exposure: Past  Physicist, Medical Strain: Low Risk (04/29/2022)   Overall  Financial Resource Strain (CARDIA)    Difficulty of Paying Living Expenses: Not hard at all  Food Insecurity: Low Risk (06/09/2023)   Received from [facility]   Epic    Within the past 12 months, you worried that your food would run out before you got money to buy more: Never true    Within the past 12 months, the food you bought just didn't last and you didn't have money to get more. : Never true  Transportation Needs: No Transportation Needs (06/09/2023)   Received from [facility]    In the past 12 months, has lack of reliable transportation kept you from medical appointments, meetings, work or from getting things needed for daily living? : No  Physical Activity: Not on file  Stress: Not on file  Social Connections: Not on file  Intimate Partner Violence: Not on file  Depression (EYV7-0): Not on file  Alcohol Screen: Low Risk (04/29/2022)   Alcohol Screen  Last Alcohol Screening Score (AUDIT): 0  Housing: Low Risk (06/09/2023)   Received from [facility]   Epic    What is your living situation today?: I have a steady place to live    Think about the place you live. Do you have problems with any of the following? Choose all that apply:: None/None on this list  Utilities: Low Risk (06/09/2023)   Received from [facility]   Utilities    In the past 12 months has the electric, gas, oil, or water  company threatened to shut off services in your home? : No  Health Literacy: Not on file   Family History  Problem Relation Age of Onset   Kidney failure Mother    Heart disease Mother        CHF   Heart disease Sister    Colon cancer Neg Hx    Esophageal cancer Neg Hx    Rectal cancer Neg Hx    Stomach cancer Neg Hx     Current Outpatient Medications  Medication Sig Dispense Refill   acetaminophen  (TYLENOL ) 500 MG tablet Take 1,000 mg by mouth every 6 (six) hours as needed for moderate pain.     albuterol  (VENTOLIN  HFA) 108 (90 Base) MCG/ACT inhaler Inhale 2  puffs into the lungs every 6 (six) hours as needed for wheezing or shortness of breath. 6.7 g 3   aspirin  81 MG chewable tablet Chew 81 mg by mouth once.     atorvastatin  (LIPITOR ) 80 MG tablet TAKE 1 TABLET(80 MG) BY MOUTH DAILY 90 tablet 1   bisoprolol  (ZEBETA ) 5 MG tablet Take 0.5 tablets (2.5 mg total) by mouth daily. 45 tablet 1   CALCIUM  PO Take 1 tablet by mouth daily at 12 noon.     Cholecalciferol (VITAMIN D3) 50 MCG (2000 UT) TABS Take 2,000 mcg by mouth daily.     clopidogrel  (PLAVIX ) 75 MG tablet TAKE 1 TABLET(75 MG) BY MOUTH DAILY WITH BREAKFAST 90 tablet 2   digoxin  (LANOXIN ) 0.125 MG tablet Take 0.5 tablets (0.0625 mg total) by mouth daily. 45 tablet 3   furosemide  (LASIX ) 20 MG tablet TAKE 1 TABLET BY MOUTH AS NEEDED 30 tablet 10   levothyroxine  (SYNTHROID ) 25 MCG tablet Take 37.5 mcg by mouth daily before breakfast. 180 tablet 1   nitroGLYCERIN  (NITROSTAT ) 0.4 MG SL tablet Place 1 tablet (0.4 mg total) under the tongue every 5 (five) minutes x 3 doses as needed for chest pain. 25 tablet 3   nystatin cream (MYCOSTATIN) Apply 1 Application topically 2 (two) times daily as needed (rash).     ranolazine  (RANEXA ) 1000 MG SR tablet TAKE 1 TABLET(1000 MG) BY MOUTH TWICE DAILY 180 tablet 3   sacubitril -valsartan  (ENTRESTO ) 49-51 MG Take 1 tablet by mouth 2 (two) times daily. 60 tablet 3   spironolactone  (ALDACTONE ) 25 MG tablet TAKE 1 TABLET(25 MG) BY MOUTH DAILY 90 tablet 3   umeclidinium-vilanterol (ANORO ELLIPTA ) 62.5-25 MCG/ACT AEPB Inhale 1 puff into the lungs daily. 60 each 11   valACYclovir  (VALTREX ) 1000 MG tablet Take 1,000 mg by mouth daily.     No current facility-administered medications for this visit.    Allergies[1]   REVIEW OF SYSTEMS:  [X]  denotes positive finding, [ ]  denotes negative finding Cardiac  Comments:  Chest pain or chest pressure:    Shortness of breath upon exertion:    Short of breath when lying flat:    Irregular heart rhythm:        Vascular  Pain in calf, thigh, or hip brought on by ambulation:    Pain in feet at night that wakes you up from your sleep:     Blood clot in your veins:    Leg swelling:         Pulmonary    Oxygen at home:    Productive cough:     Wheezing:         Neurologic    Sudden weakness in arms or legs:     Sudden numbness in arms or legs:     Sudden onset of difficulty speaking or slurred speech:    Temporary loss of vision in one eye:     Problems with dizziness:         Gastrointestinal    Blood in stool:     Vomited blood:         Genitourinary    Burning when urinating:     Blood in urine:        Psychiatric    Major depression:         Hematologic    Bleeding problems:    Problems with blood clotting too easily:        Skin    Rashes or ulcers:        Constitutional    Fever or chills:      PHYSICAL EXAMINATION:  There were no vitals filed for this visit.  General:  WDWN in NAD; vital signs documented above Gait: Not observed HENT: WNL, normocephalic Pulmonary: normal non-labored breathing , without wheezing Cardiac: regular HR Abdomen: soft, NT, no masses Skin: without rashes Vascular Exam/Pulses:  Right Left  Radial 2+ (normal) 2+ (normal)  Ulnar    Femoral absent absent  Popliteal    DP absent absent  PT     Extremities: without ischemic changes, without Gangrene , without cellulitis; without open wounds;  Musculoskeletal: no muscle wasting or atrophy  Neurologic: A&O X 3;  No focal weakness or paresthesias are detected Psychiatric:  The pt has Normal affect.   Non-Invasive Vascular Imaging:     +-------+-----------+-----------+------------+------------+  ABI/TBIToday's ABIToday's TBIPrevious ABIPrevious TBI  +-------+-----------+-----------+------------+------------+  Right 0.60       0.23                                 +-------+-----------+-----------+------------+------------+  Left  0.70       0.40                                  +-------+-----------+-----------+------------+------------+      ASSESSMENT/PLAN: Pam [name] is a 73 y.o. 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  Runoff appears to be relatively preserved bilaterally.  I had a long conversation with a regarding her right lower extremity rest pain, left lower extremity lifestyle-limiting claudication.  We discussed her CT scan which demonstrates diffuse atherosclerotic disease and the occlusions listed above.  I am worried that with her cardiac history, she may do poorly with aortobifemoral bypass.  Furthermore, aortobifemoral bypass would not address the atherosclerotic disease in the visceral portion of the aorta which appears to be flow-limiting.  My plan is to reach out to her cardiologist, Dr. Verlin to have a discussion regarding her cardiac status.  Even  at a young age of 2, I think that she may be best served with bilateral common femoral endarterectomy right axillobifemoral bypass.  My plan is to call her next Thursday after have had an opportunity to discuss things with Dr. Verlin Fonda FORBES Lanis, MD [facility] [phone]      [1]  Allergies Allergen Reactions   Lisinopril  Cough    Dry cough

## 2024-07-16 ENCOUNTER — Ambulatory Visit: Attending: Physician Assistant | Admitting: Physician Assistant

## 2024-07-16 ENCOUNTER — Telehealth (HOSPITAL_COMMUNITY): Payer: Self-pay | Admitting: Cardiology

## 2024-07-16 ENCOUNTER — Encounter: Payer: Self-pay | Admitting: Physician Assistant

## 2024-07-16 VITALS — BP 112/67 | HR 55 | Ht 63.0 in | Wt 111.2 lb

## 2024-07-16 DIAGNOSIS — I255 Ischemic cardiomyopathy: Secondary | ICD-10-CM

## 2024-07-16 DIAGNOSIS — I739 Peripheral vascular disease, unspecified: Secondary | ICD-10-CM

## 2024-07-16 DIAGNOSIS — E785 Hyperlipidemia, unspecified: Secondary | ICD-10-CM

## 2024-07-16 DIAGNOSIS — I2510 Atherosclerotic heart disease of native coronary artery without angina pectoris: Secondary | ICD-10-CM

## 2024-07-16 DIAGNOSIS — J449 Chronic obstructive pulmonary disease, unspecified: Secondary | ICD-10-CM

## 2024-07-16 NOTE — Patient Instructions (Signed)
 Medication Instructions:  Continue Aspirin  81mg  daily *If you need a refill on your cardiac medications before your next appointment, please call your pharmacy*  Lab Work: None  If you have labs (blood work) drawn today and your tests are completely normal, you will receive your results only by: MyChart Message (if you have MyChart) OR A paper copy in the mail If you have any lab test that is abnormal or we need to change your treatment, we will call you to review the results.  Testing/Procedures: None   Follow-Up: At [facility], you and your health needs are our priority.  As part of our continuing mission to provide you with exceptional heart care, our providers are all part of one team.  This team includes your primary Cardiologist (physician) and Advanced Practice Providers or APPs (Physician Assistants and Nurse Practitioners) who all work together to provide you with the care you need, when you need it.  Your next appointment:    Keep scheduled Appointments.   Provider:   Lonni Cash, MD    We recommend signing up for the patient portal called MyChart.  Sign up information is provided on this After Visit Summary.  MyChart is used to connect with patients for Virtual Visits (Telemedicine).  Patients are able to view lab/test results, encounter notes, upcoming appointments, etc.  Non-urgent messages can be sent to your provider as well.   To learn more about what you can do with MyChart, go to [email or web address].   Other Instructions

## 2024-07-16 NOTE — Progress Notes (Signed)
 Cardiology Office Note   Date:  07/16/2024  ID:  Pam [name], DOB [birth year], MRN [id number] PCP: Andrew Truman GRADE., MD  [facility] Providers Cardiologist:  Lonni Cash, MD Electrophysiologist:  Soyla Gladis Norton, MD     History of Present Illness Pam [name] is a 73 y.o. female with past medical history of CAD with known CTO to mid LAD fills with left to left and the right to left collaterals, chronic systolic heart failure, CAD s/p ICD, former smoker, COPD, and PAD.  She quit smoking in 2017.  She presented to the hospital in December 2017 as NSTEMI.  EF was 35 to 40% on echocardiogram with preserved LV function.  Cardiac catheterization showed heavily calcified CTO of proximal to mid LAD filled with collaterals and a calcified stenosis in left circumflex/OM1.  Follow-up cardiac MRI demonstrated nonviable LAD territory.  She underwent arthrectomy and IVUS guided PCI of proximal left circumflex/OM1.  She had reduction of her EF down to 25 to 30% in November 2020 and ultimately underwent Medtronic ICD implantation in early 2021.  She was admitted in September 2023 with acute on chronic heart failure exacerbation.  Echocardiogram and a left heart cath were stable from the prior. PFT showed reversible obstructive lung disease and emphysema.  Dr. Gardenia has previously met the patient and discussed possibility of LVAD, she wished to hold off.  She was not interested in [place] either.  GDMT included bisoprolol , digoxin , Lasix , Entresto , spironolactone .  She could not tolerate SGLT2 inhibitor due to rash.  Since Dr. Gardenia left, patient is scheduled to establish with Dr. Zenaida in January.  More recently, patient complained of claudication symptom.  ABI in October 2025 showed right ABI 0.6, left ABI 0.7.  Duplex showed significant stenosis in the mid external iliac artery and the proximal portion of the common femoral artery on the right, she also had monophasic waveform in  the external iliac artery on the right side with no infrainguinal disease.  She complained of severe claudication symptom and night, Dr. Darron recommended proceed with lower extremity angiography and endovascular intervention.  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  There was no endovascular option for revascularization.  Patient was referred to vascular surgery to see if she will be a candidate for abdominal aorta and neurectomy and aortobifemoral bypass surgery with bilateral common femoral artery enterectomy surgery.  Echocardiogram obtained on 07/02/2024 showed her EF slightly improved to 35 to 40%, grade 1 DD, regional wall motion abnormality, RVSP 49 mmHg, severe TR.  CTA of the abdomen and pelvis obtained on 07/05/2024 showed multifocal moderate to severe abdominal aortic disease secondary to dense calcific coral reef-like atherosclerotic plaque, moderate to severe disease present in the celiac artery and SMA, moderate to severe right common iliac and external iliac disease secondary to dense calcific atherosclerosis, moderate left iliac artery disease with occlusion of the left external iliac artery. Carotid Doppler obtained on 07/11/2024 showed 1 to 39% disease in bilateral ICA, greater than 50% disease in bilateral ECA, normal hemodynamic blood flow in the bilateral subclavian artery and vertebral artery. Since her  procedure, she has been seen by Dr. Silver on 07/11/2024 who is concerned about her cardiac history  however wished to proceed with bilateral common femoral endarterectomy and a right axillobifemoral bypass surgery if possible.  It was felt that this simply doing aortobifemoral bypass surgery would not address the significant stenosis in the visceral portion of the aorta.  Patient presents today for posthospital follow-up.  She denies any recent chest pain.  It appears Dr. Silver is planning to proceed with bilateral common femoral enterectomy and a right axillofemoral bypass surgery once he discussed the case with Dr. Verlin.  I discussed the patient's case with Dr. Verlin, she denies any recent exertional chest pain.  Although she has trace amount of ankle edema, however she does not appear to be significantly volume overloaded.  Given her significant cardiac history, she would likely be a high risk candidate for the upcoming surgery, however she is also dealing with severe claudication symptom at rest and has poor quality of life without intervention.  She sleeps with her right leg tangled on the side of the bed and has constant pain both at rest and worse with exertion.  She also has exertional claudication in the left lower extremity that is worse when she walks but not occurring at rest.  From the cardiac perspective, she is optimized to proceed with vascular surgery.  If needed, she may hold Plavix  for 7 days prior to the surgery and restart as soon as possible afterward at the surgeon's discretion.  She has upcoming follow-up with heart failure service in January.  ROS:   She denies any exertional chest pain or significant shortness of breath.  She is complaining of severe bilateral leg pain, right worse than the left side.  Right leg has pain both at rest and with exertion, left lower extremity only has pain with exertion.  Studies Reviewed      Cardiac Studies & Procedures   ______________________________________________________________________________________________ CARDIAC  CATHETERIZATION  CARDIAC CATHETERIZATION 08/11/2022  Conclusion HEMODYNAMICS: RA:   1 mmHg (mean) RV:   27/1 mmHg PA:   31/5 mmHg (14 mean) PCWP:  5 mmHg (mean)  Estimated Fick CO/CI   2.87 L/min, 1.89 L/min/m2  TPG    9  mmHg PVR     3.14 Wood Units PAPi      >4   IMPRESSION: 1.Low pre and post capillary filling pressures 2.Normal PA mean, elevated PVR secondary to severely reduced cardiac output.  RECOMMENDATIONS: Start evaluation for advanced therapies.  Aditya Sabharwal Advanced Heart Failure 12:37 PM   CARDIAC CATHETERIZATION  CARDIAC CATHETERIZATION 04/29/2022  Conclusion   Prox LAD to Mid LAD lesion is 90% stenosed with 95% stenosed side branch in 1st Sept. Mid LAD to Dist LAD lesion is 100% stenosed.   Previously placed Prox Cx to Dist Cx stent of unknown type is  widely patent with 0% stenosed side branch in 1st Mrg.   Lat 1st Mrg lesion is 50% stenosed.   LV end diastolic pressure is moderately elevated.   There is no aortic valve stenosis.  POST CATH DIAGNOSES Stable coronary disease with widely patent proximal to mid LCx stent.  At most 50% ostial OM1. Known occlusion of the LAD after SP1.  The ostium itself has a roughly 90% stenosis which could be a source of ischemia. There is a small diagonal branch comes off at the same location. Mild diffuse disease in the RCA. Moderately elevated EDP of 18 mmHg. Suspect troponin elevation was related to demand ischemia  with known existing disease and insult to the heart via CHF exacerbation versus pneumonia.   RECOMMENDATIONS Return to nursing unit for ongoing care. She appears to be borderline euvolemic    Alm Clay, MD  Findings Coronary Findings Diagnostic  Dominance: Right  Left Anterior Descending Collaterals Dist LAD filled by collaterals from Acute Mrg.  Prox LAD to Mid LAD lesion is 90% stenosed with 95% stenosed side branch in 1st Sept. Mid LAD to Dist LAD lesion is 100%  stenosed.  First Diagonal Branch Vessel is small in size.  First Septal Branch Vessel is small in size.  Second Diagonal Branch Vessel is small in size. Collaterals 2nd Diag filled by collaterals from RV Branch.  Third Diagonal Branch Collaterals 3rd Diag filled by collaterals from Lat 1st Mrg.  Ramus Intermedius Vessel is small.  Left Circumflex Vessel is large. Previously placed Prox Cx to Dist Cx stent of unknown type is  widely patent with 0% stenosed side branch in 1st Mrg. Widely patent  First Obtuse Marginal Branch Vessel is large in size.  Lateral First Obtuse Marginal Branch Lat 1st Mrg lesion is 50% stenosed.  Left Posterior Atrioventricular Artery Vessel is small in size.  Right Coronary Artery  Acute Marginal Branch Vessel is small in size. Appears to perfuse the distal portion of the PDA  Right Ventricular Branch Vessel is small in size.  Right Posterior Descending Artery Vessel is small in size.  First Right Posterolateral Branch Vessel is small in size.  Intervention  No interventions have been documented.     ECHOCARDIOGRAM  ECHOCARDIOGRAM COMPLETE 07/02/2024  Narrative ECHOCARDIOGRAM REPORT    Patient Name:   Pam [name] Date of Exam: 07/02/2024 Medical Rec #:  [id number]   Height:       63.0 in Accession #:    [id number]  Weight:       110.0 lb Date of Birth:  [birth year]  BSA:          1.500 m Patient Age:    72 years    BP:           118/58 mmHg Patient Gender: F           HR:           65 bpm. Exam Location:  Outpatient  Procedure: 2D Echo and Intracardiac Opacification Agent (Both Spectral and Color Flow Doppler were utilized during procedure).  Indications:   CHF  History:       Patient has prior history of Echocardiogram examinations. CHF; CAD.  Sonographer:   Charmaine Gaskins Referring      [id number] Phys:  IMPRESSIONS   1. No LV thrombus (Definity  contrast). Left ventricular ejection fraction,  by estimation, is 35 to 40%. The left ventricle has moderately decreased function. The left ventricle demonstrates regional wall motion abnormalities (see scoring diagram/findings for description). Left ventricular diastolic parameters are consistent with Grade I diastolic dysfunction (impaired relaxation). 2. Right ventricular systolic function is normal. The right ventricular size is normal. There is moderately elevated pulmonary artery systolic pressure. The estimated right ventricular systolic pressure is 49.0 mmHg. 3. Left atrial size was mildly dilated. 4. The mitral valve is normal in structure. No evidence of mitral valve regurgitation. No evidence of mitral stenosis. 5. Tricuspid valve regurgitation is severe. 6. The aortic valve is tricuspid. There is mild calcification of the aortic valve. Aortic valve regurgitation is not visualized. No aortic stenosis is present. 7. The inferior vena cava is normal in size  with greater than 50% respiratory variability, suggesting right atrial pressure of 3 mmHg.  FINDINGS Left Ventricle: No LV thrombus (Definity  contrast). Left ventricular ejection fraction, by estimation, is 35 to 40%. The left ventricle has moderately decreased function. The left ventricle demonstrates regional wall motion abnormalities. Definity  contrast agent was given IV to delineate the left ventricular endocardial borders. The left ventricular internal cavity size was normal in size. There is no left ventricular hypertrophy. Left ventricular diastolic parameters are consistent with Grade I diastolic dysfunction (impaired relaxation).   LV Wall Scoring: The mid and distal anterior septum, mid inferoseptal segment, apical anterior segment, and apex are akinetic.  Right Ventricle: The right ventricular size is normal. No increase in right ventricular wall thickness. Right ventricular systolic function is normal. There is moderately elevated pulmonary artery systolic pressure. The  tricuspid regurgitant velocity is 3.39 m/s, and with an assumed right atrial pressure of 3 mmHg, the estimated right ventricular systolic pressure is 49.0 mmHg.  Left Atrium: Left atrial size was mildly dilated.  Right Atrium: Right atrial size was normal in size.  Pericardium: There is no evidence of pericardial effusion.  Mitral Valve: The mitral valve is normal in structure. No evidence of mitral valve regurgitation. No evidence of mitral valve stenosis.  Tricuspid Valve: The tricuspid valve is normal in structure. Tricuspid valve regurgitation is severe. No evidence of tricuspid stenosis.  Aortic Valve: The aortic valve is tricuspid. There is mild calcification of the aortic valve. Aortic valve regurgitation is not visualized. No aortic stenosis is present. Aortic valve mean gradient measures 2.0 mmHg. Aortic valve peak gradient measures 4.8 mmHg. Aortic valve area, by VTI measures 1.87 cm.  Pulmonic Valve: The pulmonic valve was normal in structure. Pulmonic valve regurgitation is trivial. No evidence of pulmonic stenosis.  Aorta: The aortic root is normal in size and structure.  Venous: The inferior vena cava is normal in size with greater than 50% respiratory variability, suggesting right atrial pressure of 3 mmHg.  IAS/Shunts: No atrial level shunt detected by color flow Doppler.  Additional Comments: A device lead is visualized in the right ventricle.   LEFT VENTRICLE PLAX 2D LVIDd:         3.70 cm     Diastology LVIDs:         2.60 cm     LV e' medial:    5.22 cm/s LV PW:         0.80 cm     LV E/e' medial:  16.4 LV IVS:        0.90 cm     LV e' lateral:   10.30 cm/s LVOT diam:     1.70 cm     LV E/e' lateral: 8.3 LV SV:         46 LV SV Index:   31 LVOT Area:     2.27 cm  LV Volumes (MOD) LV vol d, MOD A2C: 82.3 ml LV vol d, MOD A4C: 67.3 ml LV vol s, MOD A2C: 40.4 ml LV vol s, MOD A4C: 36.9 ml LV SV MOD A2C:     41.9 ml LV SV MOD A4C:     67.3 ml LV SV MOD  BP:      36.5 ml  RIGHT VENTRICLE RV Basal diam:  2.40 cm RV Mid diam:    2.80 cm RV S prime:     12.00 cm/s  LEFT ATRIUM             Index  RIGHT ATRIUM           Index LA diam:        3.20 cm 2.13 cm/m   RA Area:     13.50 cm LA Vol (A2C):   51.7 ml 34.47 ml/m  RA Volume:   33.00 ml  22.00 ml/m LA Vol (A4C):   50.2 ml 33.47 ml/m LA Biplane Vol: 50.8 ml 33.87 ml/m AORTIC VALVE AV Area (Vmax):    1.83 cm AV Area (Vmean):   2.00 cm AV Area (VTI):     1.87 cm AV Vmax:           110.00 cm/s AV Vmean:          73.200 cm/s AV VTI:            0.246 m AV Peak Grad:      4.8 mmHg AV Mean Grad:      2.0 mmHg LVOT Vmax:         88.60 cm/s LVOT Vmean:        64.600 cm/s LVOT VTI:          0.203 m LVOT/AV VTI ratio: 0.83  AORTA Ao Root diam: 2.60 cm Ao Asc diam:  2.60 cm  MITRAL VALVE               TRICUSPID VALVE MV Area (PHT): 2.58 cm    TR Peak grad:   46.0 mmHg MV Decel Time: 294 msec    TR Vmax:        339.00 cm/s MV E velocity: 85.40 cm/s MV A velocity: 84.40 cm/s  SHUNTS MV E/A ratio:  1.01        Systemic VTI:  0.20 m Systemic Diam: 1.70 cm  Oneil Parchment MD Electronically signed by Oneil Parchment MD Signature Date/Time: 07/02/2024/1:31:42 PM    Final        CARDIAC MRI  MR CARDIAC MORPHOLOGY W WO CONTRAST 07/11/2016  Narrative CLINICAL DATA:  73 year old female with ischemic cardiomyopathy. Evaluate for viability.  EXAM: CARDIAC MRI  TECHNIQUE: The patient was scanned on a 1.5 Tesla GE magnet. A dedicated cardiac coil was used. Functional imaging was done using Fiesta sequences. 2,3, and 4 chamber views were done to assess for RWMA's. Modified Simpson's rule using a short axis stack was used to calculate an ejection fraction on a dedicated work [employer]. The patient received 24 cc of Multihance . After 10 minutes inversion recovery sequences were used to assess for infiltration and scar tissue.  CONTRAST:  24 cc  of  Multihance   FINDINGS: 1. Normal size and thickness of the left ventricle. Systolic function is moderately decreased (LVEF = 38%).  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 There is akinesis of the apical lateral, mid anterior, anteroseptal walls.  There is late gadolinium enhancement in the following segments:  True apex:  75-100% (dismal chance of recovery if revascularized)  Apical anterior: 75-100% (dismal chance of recovery if revascularized)  Apical septal: 75-100% (dismal chance of recovery if revascularized)  Apical lateral: 50-75% (poor chance of recovery if revascularized)  Mid anterior: 75-100% (dismal chance of recovery if revascularized)  Mid anteroseptal: 50-75% (poor chance of recovery if revascularized)  LVEDD:  52 mm  LVESD:  40 mm  LVEDV:  156 ml  LVESV:  96 ml  SV:  60 ml  Myocardial mass:  122 g  2. Normal right ventricular size, thickness and systolic function (RVEF =  54%) with no regional wall motion abnormalities.  3.  Normal biatrial size.  4.  Mild mitral and mild to moderate tricuspid regurgitation.  5. Normal size of the aortic root, ascending aorta and pulmonary artery.  6.  Normal pericardium, trivial pericardial effusion.  IMPRESSION: 1. Normal size and thickness of the left ventricle. Systolic function is moderately decreased (LVEF = 38%).  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 There is akinesis of the apical lateral, mid anterior, anteroseptal walls.  The LAD territory is non-viable, RCA and LCX territory is viable.  2. Normal right ventricular size, thickness and systolic function (RVEF = 54%) with no regional wall motion abnormalities.  3.  Normal biatrial size.  4.  Mild mitral and mild to moderate tricuspid regurgitation.  5. Normal size of the aortic root, ascending aorta and pulmonary artery.  6.  Normal  pericardium, trivial pericardial effusion.  Leim Moose   Electronically Signed By: Leim Moose On: 07/11/2016 12:22   ______________________________________________________________________________________________      Risk Assessment/Calculations           Physical Exam VS:  BP 112/67 (BP Location: Left Arm, Patient Position: Sitting, Cuff Size: Normal)   Pulse (!) 55   Ht 5' 3 (1.6 m)   Wt 111 lb 3.2 oz (50.4 kg)   SpO2 100%   BMI 19.70 kg/m        Wt Readings from Last 3 Encounters:  07/16/24 111 lb 3.2 oz (50.4 kg)  07/11/24 109 lb 14.4 oz (49.9 kg)  06/19/24 110 lb (49.9 kg)    GEN: Well nourished, well developed in no acute distress NECK: No JVD; No carotid bruits CARDIAC: RRR, no murmurs, rubs, gallops RESPIRATORY:  Clear to auscultation without rales, wheezing or rhonchi  ABDOMEN: Soft, non-tender, non-distended EXTREMITIES: Trace ankle edema, lower extremity toes appears to be red.  No open ulcer however right big toe has a healing cut.  Nonpalpable lower extremity pulses.  ASSESSMENT AND PLAN  PAD  - lower extremity angiography on 06/19/2024 that showed severe disease in abdominal aorta at the origin of the celiac trunk and SMA with significant stenosis in the distal aorta, significant right common iliac artery disease with subtotally occluded external iliac artery and occluded right common femoral artery with no significant inferior inguinal disease, occluded left lower extremity external iliac artery extending into the proximal portion of the common femoral artery with no significant infrarenal disease.  There was no endovascular option for revascularization.  Patient was referred to vascular surgery to see if she will be a candidate for abdominal aorta and neurectomy and aortobifemoral bypass surgery with bilateral common femoral artery enterectomy surgery  - Patient has been seen by Dr. Silver who felt patient may benefit from bilateral common  femoral endarterectomy and a right axillobifemoral bypass surgery, however will need to discuss with Dr. Verlin  - I discussed the patient's case with Dr. Verlin who will reach out to Dr. Silver.  From the cardiac perspective, recent echocardiogram shows her EF is stable at 35 to 40%.  She is optimized to proceed with surgery if needed.  Unfortunately given her prior history, she is likely going to be a high risk candidate for any surgery however she also is in severe pain and requires surgery for improvement of quality of life.   CAD  - First diagnosed in December 2017.  Last cardiac catheterization obtained in September 2023 showed stable coronary anatomy with known  CTO of mid LAD.  Ischemic cardiomyopathy s/p Medtronic ICD: Trace ankle edema in the ankle may be related to severe TR seen on echocardiogram.  Euvolemic on exam. Previously followed by Dr.  Gardenia and declined LVAD or Barostim.  Has upcoming follow-up with Dr. Zenaida in January.  - Continue bisoprolol , digoxin , PRN furosemide , Entresto  and spironolactone .  Not on SGLT2 inhibitor given history of rash.  COPD: No acute exacerbation  Hyperlipidemia: Atorvastatin  80 mg daily         Dispo: Follow-up with heart failure service in January.  Signed, Scot Ford, PA

## 2024-07-18 ENCOUNTER — Other Ambulatory Visit: Payer: Self-pay

## 2024-07-18 ENCOUNTER — Ambulatory Visit: Attending: Vascular Surgery | Admitting: Vascular Surgery

## 2024-07-18 DIAGNOSIS — I745 Embolism and thrombosis of iliac artery: Secondary | ICD-10-CM

## 2024-07-18 DIAGNOSIS — I771 Stricture of artery: Secondary | ICD-10-CM

## 2024-07-18 DIAGNOSIS — I700 Atherosclerosis of aorta: Secondary | ICD-10-CM | POA: Diagnosis not present

## 2024-07-18 DIAGNOSIS — I70221 Atherosclerosis of native arteries of extremities with rest pain, right leg: Secondary | ICD-10-CM | POA: Diagnosis not present

## 2024-07-18 DIAGNOSIS — I70229 Atherosclerosis of native arteries of extremities with rest pain, unspecified extremity: Secondary | ICD-10-CM

## 2024-07-18 NOTE — Progress Notes (Signed)
 Office Note     CC: Bilateral lower extremity rest pain Requesting Provider:  Andrew Truman GRADE., MD  HPI: Pam [name] is a 73 y.o. (December 31, 1950) female presenting presenting via phone call status post cardiac clearance to discuss revascularization.  Originally from [place] , she moved to [place]  years ago.  She is married with 2 children and 4 grandchildren.  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  Pam [name] is a former smoker, having quit in 2017 after NSTEMI-see below. Notes left lower extremity lifestyle-limiting claudication, right lower extremity rest pain which wakes her up at night and makes it very difficult to sleep.  Denies symptoms of chronic mesenteric ischemia.  Able to eat without issue.  Left sided defibrillator, surgical history includes hysterectomy  On exam today, she was continuing to have lower extremity rest pain.  Notes that she has a very difficult time sleeping at night, and wakes up constantly.  Denies wounds.  ___________________  Cardiac history includes: CAD, ischemic cardiomyopathy, chronic systolic CHF, HTN, HLD and hypothyroidism  - December 2017 NSTEMI with chronic occlusion of the mid LAD and severe, calcified stenosis in the proximal Circumflex.  LVEF was 30% by LV gram and 35-40% by echo.  - December 2017 cardiac MRI with non-viability of the anterior wall in the LAD territory. LVEF=38% by cardiac MRI. The Circumflex stenosis was treated with orbital atherectomy and stenting using a drug eluting stent in a staged procedure in January 2018.  - November 2020 Echo with LVEF=25-30%. Dilated LV. Mild MR. ICD placed in January 2021 and followed by Dr. Antonetta.  - September 2023 with acute on chronic CHF exacerbation. Cardiac cath September 2023 with stable CAD. - September 2023 Echo with LVEF=25-30%. No significant valve disease. She is now followed in our Advanced Heart failure clinic by Dr. Gardenia.  - January  2024 right heart cath with low right heart pressures but low cardiac output.   She was seen in the Advanced Heart Failure clinic in April 2025.  She does not wish to consider any advanced heart failure therapies.    The pt is  on a statin for cholesterol management.  The pt is  on a daily aspirin .   Other AC:  - The pt is  on medication for hypertension.   The pt is  diabetic.  Tobacco hx:    Past Medical History:  Diagnosis Date   Abnormal EKG    AICD (automatic cardioverter/defibrillator) present    Alcoholic intoxication without complication    Allergy    Cardiomyopathy, ischemic    Cataract    Chest pain 08/29/2016   Chest pain 08/29/2016   CHF (congestive heart failure) (HCC)    Chronic systolic heart failure (HCC) 08/29/2016   Facial laceration    Fall 07/07/2016   Hyperlipidemia    Hypertension 03/03/2017   Hyponatremia    Hypothyroidism 07/08/2016   NSTEMI (non-ST elevated myocardial infarction) (HCC)    Syncope    Thyroid  disease     Past Surgical History:  Procedure Laterality Date   CARDIAC CATHETERIZATION N/A 07/08/2016   Procedure: Left Heart Cath and Coronary Angiography;  Surgeon: Lonni Hanson, MD;  Location: [facility] INVASIVE CV LAB;  Service: Cardiovascular;  Laterality: N/A;   CARDIAC CATHETERIZATION N/A 08/29/2016   Procedure: Coronary Atherectomy;  Surgeon: Lonni Hanson, MD;  Location: [facility] INVASIVE CV LAB;  Service: Cardiovascular;  Laterality: N/A;   CARDIAC CATHETERIZATION N/A 08/29/2016   Procedure: Coronary Stent Intervention;  Surgeon: Lonni  End, MD;  Location: [facility] INVASIVE CV LAB;  Service: Cardiovascular;  Laterality: N/A;   CATARACT EXTRACTION, BILATERAL     COLONOSCOPY WITH PROPOFOL  N/A 11/18/2021   Procedure: COLONOSCOPY WITH PROPOFOL ;  Surgeon: Eda Iha, MD;  Location: [facility] ENDOSCOPY;  Service: Gastroenterology;  Laterality: N/A;   CORONARY ANGIOPLASTY     coronary stents      ICD IMPLANT N/A 08/28/2019   Procedure: ICD  IMPLANT;  Surgeon: Inocencio Soyla Lunger, MD;  Location: [facility] INVASIVE CV LAB;  Service: Cardiovascular;  Laterality: N/A;   LEFT HEART CATH AND CORONARY ANGIOGRAPHY N/A 04/29/2022   Procedure: LEFT HEART CATH AND CORONARY ANGIOGRAPHY;  Surgeon: Anner Alm ORN, MD;  Location: [facility] INVASIVE CV LAB;  Service: Cardiovascular;  Laterality: N/A;   LOWER EXTREMITY ANGIOGRAPHY N/A 06/19/2024   Procedure: Lower Extremity Angiography;  Surgeon: Darron Deatrice LABOR, MD;  Location: [facility] INVASIVE CV LAB;  Service: Cardiovascular;  Laterality: N/A;   PARATHYROIDECTOMY Right 02/02/2021   Procedure: RIGHT SUPERIOR PARATHYROIDECTOMY;  Surgeon: Eletha Boas, MD;  Location: [facility] ORS;  Service: General;  Laterality: Right;   POLYPECTOMY  11/18/2021   Procedure: POLYPECTOMY;  Surgeon: Eda Iha, MD;  Location: [facility] ENDOSCOPY;  Service: Gastroenterology;;   RIGHT HEART CATH N/A 08/11/2022   Procedure: RIGHT HEART CATH;  Surgeon: Gardenia Led, DO;  Location: [facility] INVASIVE CV LAB;  Service: Cardiovascular;  Laterality: N/A;   VAGINAL HYSTERECTOMY      Social History   Socioeconomic History   Marital status: Married    Spouse name: Tefl Teacher   Number of children: 2   Years of education: Not on file   Highest education level: High school graduate  Occupational History   Occupation: Retired-worked for post office  Tobacco Use   Smoking status: Former    Current packs/day: 0.00    Average packs/day: 1 pack/day for 50.0 years (50.0 ttl pk-yrs)    Types: Cigarettes    Start date: 07/07/1966    Quit date: 07/07/2016    Years since quitting: 8.0   Smokeless tobacco: Never  Vaping Use   Vaping status: Never Used  Substance and Sexual Activity   Alcohol use: Not Currently   Drug use: Never   Sexual activity: Not Currently  Other Topics Concern   Not on file  Social History Narrative   Not on file   Social Drivers of Health   Tobacco Use: Medium Risk (07/16/2024)   Patient History    Smoking Tobacco Use:  Former    Smokeless Tobacco Use: Never    Passive Exposure: Not on Actuary Strain: Low Risk (04/29/2022)   Overall Financial Resource Strain (CARDIA)    Difficulty of Paying Living Expenses: Not hard at all  Food Insecurity: Low Risk (06/09/2023)   Received from [facility]   Epic    Within the past 12 months, you worried that your food would run out before you got money to buy more: Never true    Within the past 12 months, the food you bought just didn't last and you didn't have money to get more. : Never true  Transportation Needs: No Transportation Needs (06/09/2023)   Received from [facility]    In the past 12 months, has lack of reliable transportation kept you from medical appointments, meetings, work or from getting things needed for daily living? : No  Physical Activity: Not on file  Stress: Not on file  Social Connections: Not on file  Intimate Partner Violence: Not  on file  Depression (EYV7-0): Not on file  Alcohol Screen: Low Risk (04/29/2022)   Alcohol Screen    Last Alcohol Screening Score (AUDIT): 0  Housing: Low Risk (06/09/2023)   Received from [facility]   Epic    What is your living situation today?: I have a steady place to live    Think about the place you live. Do you have problems with any of the following? Choose all that apply:: None/None on this list  Utilities: Low Risk (06/09/2023)   Received from [facility]   Utilities    In the past 12 months has the electric, gas, oil, or water  company threatened to shut off services in your home? : No  Health Literacy: Not on file   Family History  Problem Relation Age of Onset   Kidney failure Mother    Heart disease Mother        CHF   Heart disease Sister    Colon cancer Neg Hx    Esophageal cancer Neg Hx    Rectal cancer Neg Hx    Stomach cancer Neg Hx     Current Outpatient Medications  Medication Sig Dispense Refill   acetaminophen  (TYLENOL ) 500 MG tablet Take  1,000 mg by mouth every 6 (six) hours as needed for moderate pain.     albuterol  (VENTOLIN  HFA) 108 (90 Base) MCG/ACT inhaler Inhale 2 puffs into the lungs every 6 (six) hours as needed for wheezing or shortness of breath. 6.7 g 3   aspirin  81 MG chewable tablet Chew 81 mg by mouth once. (Patient not taking: Reported on 07/16/2024)     atorvastatin  (LIPITOR ) 80 MG tablet TAKE 1 TABLET(80 MG) BY MOUTH DAILY 90 tablet 1   bisoprolol  (ZEBETA ) 5 MG tablet Take 0.5 tablets (2.5 mg total) by mouth daily. 45 tablet 1   CALCIUM  PO Take 1 tablet by mouth daily at 12 noon.     Cholecalciferol (VITAMIN D3) 50 MCG (2000 UT) TABS Take 2,000 mcg by mouth daily.     clopidogrel  (PLAVIX ) 75 MG tablet TAKE 1 TABLET(75 MG) BY MOUTH DAILY WITH BREAKFAST 90 tablet 2   digoxin  (LANOXIN ) 0.125 MG tablet Take 0.5 tablets (0.0625 mg total) by mouth daily. 45 tablet 3   furosemide  (LASIX ) 20 MG tablet TAKE 1 TABLET BY MOUTH AS NEEDED 30 tablet 10   levothyroxine  (SYNTHROID ) 25 MCG tablet Take 37.5 mcg by mouth daily before breakfast. 180 tablet 1   nitroGLYCERIN  (NITROSTAT ) 0.4 MG SL tablet Place 1 tablet (0.4 mg total) under the tongue every 5 (five) minutes x 3 doses as needed for chest pain. 25 tablet 3   nystatin cream (MYCOSTATIN) Apply 1 Application topically 2 (two) times daily as needed (rash).     ranolazine  (RANEXA ) 1000 MG SR tablet TAKE 1 TABLET(1000 MG) BY MOUTH TWICE DAILY 180 tablet 3   sacubitril -valsartan  (ENTRESTO ) 49-51 MG Take 1 tablet by mouth 2 (two) times daily. 60 tablet 3   spironolactone  (ALDACTONE ) 25 MG tablet TAKE 1 TABLET(25 MG) BY MOUTH DAILY 90 tablet 3   umeclidinium-vilanterol (ANORO ELLIPTA ) 62.5-25 MCG/ACT AEPB Inhale 1 puff into the lungs daily. 60 each 11   valACYclovir  (VALTREX ) 1000 MG tablet Take 1,000 mg by mouth daily.     No current facility-administered medications for this visit.    Allergies[1]   REVIEW OF SYSTEMS:  [X]  denotes positive finding, [ ]  denotes negative  finding Cardiac  Comments:  Chest pain or chest pressure:    Shortness  of breath upon exertion:    Short of breath when lying flat:    Irregular heart rhythm:        Vascular    Pain in calf, thigh, or hip brought on by ambulation:    Pain in feet at night that wakes you up from your sleep:     Blood clot in your veins:    Leg swelling:         Pulmonary    Oxygen at home:    Productive cough:     Wheezing:         Neurologic    Sudden weakness in arms or legs:     Sudden numbness in arms or legs:     Sudden onset of difficulty speaking or slurred speech:    Temporary loss of vision in one eye:     Problems with dizziness:         Gastrointestinal    Blood in stool:     Vomited blood:         Genitourinary    Burning when urinating:     Blood in urine:        Psychiatric    Major depression:         Hematologic    Bleeding problems:    Problems with blood clotting too easily:        Skin    Rashes or ulcers:        Constitutional    Fever or chills:      PHYSICAL EXAMINATION:  There were no vitals filed for this visit.  General:  WDWN in NAD; vital signs documented above Gait: Not observed HENT: WNL, normocephalic Pulmonary: normal non-labored breathing , without wheezing Cardiac: regular HR Abdomen: soft, NT, no masses Skin: without rashes Vascular Exam/Pulses:  Right Left  Radial 2+ (normal) 2+ (normal)  Ulnar    Femoral absent absent  Popliteal    DP absent absent  PT     Extremities: without ischemic changes, without Gangrene , without cellulitis; without open wounds;  Musculoskeletal: no muscle wasting or atrophy  Neurologic: A&O X 3;  No focal weakness or paresthesias are detected Psychiatric:  The pt has Normal affect.   Non-Invasive Vascular Imaging:     +-------+-----------+-----------+------------+------------+  ABI/TBIToday's ABIToday's TBIPrevious ABIPrevious TBI   +-------+-----------+-----------+------------+------------+  Right 0.60       0.23                                 +-------+-----------+-----------+------------+------------+  Left  0.70       0.40                                 +-------+-----------+-----------+------------+------------+      ASSESSMENT/PLAN: Nyana Haren is a 73 y.o. 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  Runoff appears to be relatively preserved bilaterally.  I had a long conversation with a regarding her right lower extremity rest pain, left lower extremity lifestyle-limiting claudication.  We discussed her CT scan which demonstrates diffuse atherosclerotic disease and the occlusions listed above.  I am worried that with her cardiac history, she may do poorly with aortobifemoral bypass.  Furthermore, aortobifemoral bypass would not address the atherosclerotic disease in the visceral portion  of the aorta which appears to be flow-limiting.  Pam [name] was offered axillobifemoral bypass with bilateral common femoral endarterectomy.  We discussed the risks and benefits in detail with her husband also on the phone call.  After discussing, she elected to proceed.   Pam FORBES Rim, MD [facility] [phone]  Total time of patient care including pre-visit research, consultation, and documentation greater than 40 minutes Phone call 25 minutes     [1]  Allergies Allergen Reactions   Lisinopril  Cough    Dry cough

## 2024-07-22 ENCOUNTER — Encounter: Payer: Self-pay | Admitting: Cardiology

## 2024-07-22 NOTE — Progress Notes (Signed)
 PERIOPERATIVE PRESCRIPTION FOR IMPLANTED CARDIAC DEVICE PROGRAMMING  Patient Information: Name:  Pam [name]  DOB:  19-Dec-1950  MRN:  [id number]    Planned Procedure:  CREATION, BYPASS, ARTERIAL, AXILLARY TO BILATERAL FEMORAL, USING GRAFT - Bilatera  Surgeon:  Dr Fonda Rim  Date of Procedure:  07/29/24  Cautery will be used.  Position during surgery:  supine   Please send documentation back to:  [facility] (Fax # [phone])  Device Information:  Clinic EP Physician:  Soyla Norton, MD   Device Type:  Defibrillator Manufacturer and Phone #:  Medtronic: [phone] Pacemaker Dependent?:  No. Date of Last Device Check:  05/29/2024  Normal Device Function?:  Yes.    Electrophysiologist's Recommendations:  Have magnet available. Provide continuous ECG monitoring when magnet is used or reprogramming is to be performed.  Procedure will likely interfere with device function.  Device should be programmed:  Tachy therapies disabled  Per Device Clinic Standing Orders, Pam ONEIDA Shutter, RN  4:11 PM 07/22/2024

## 2024-07-22 NOTE — Pre-Procedure Instructions (Signed)
 Surgical Instructions   Your procedure is scheduled on July 29, 2024. Report to [facility] Main Entrance A at 8:00 A.M., then check in with the Admitting office. Any questions or running late day of surgery: call [phone]  Questions prior to your surgery date: call [phone], Monday-Friday, 8am-4pm. If you experience any cold or flu symptoms such as cough, fever, chills, shortness of breath, etc. between now and your scheduled surgery, please notify us  at the above number.     Remember:  Do not eat or drink after midnight the night before your surgery. No gum, mints, or hard candy.      Take these medicines the morning of surgery with A SIP OF WATER : atorvastatin  (LIPITOR )  bisoprolol  (ZEBETA )  digoxin  (LANOXIN   levothyroxine  (SYNTHROID )  ranolazine  (RANEXA )  umeclidinium-vilanterol (ANORO ELLIPTA )  valACYclovir  (VALTREX )  Aspirin   May take these medicines IF NEEDED: acetaminophen  (TYLENOL )  albuterol  (VENTOLIN  HFA) 108 (90 Base) MCG/ACT inhaler  nitroGLYCERIN  (NITROSTAT )   YOU WILL NEED TO HOLD YOUR  PLAVIX  FOR 5 DAYS BEFORE YOUR PROCEDURE. TAKE YOUR LAST DOSE ON 12/23.   PLEASE CONTINUE TAKING ASPIRIN .  One week prior to surgery, STOP taking any  Aleve, Naproxen, Ibuprofen, Motrin, Advil, Goody's, BC's, all herbal medications, fish oil, and non-prescription vitamins.                     Do NOT Smoke (Tobacco/Vaping) for 24 hours prior to your procedure.  If you use a CPAP at night, you may bring your mask/headgear for your overnight stay.   You will be asked to remove any contacts, glasses, piercing's, hearing aid's, dentures/partials prior to surgery. Please bring cases for these items if needed.    Patients discharged the day of surgery will not be allowed to drive home, and someone needs to stay with them for 24 hours.  SURGICAL WAITING ROOM VISITATION Patients may have no more than 2 support people in the waiting area - these visitors may rotate.    Pre-op nurse will coordinate an appropriate time for 1 ADULT support person, who may not rotate, to accompany patient in pre-op.  Children under the age of 1 must have an adult with them who is not the patient and must remain in the main waiting area with an adult.  If the patient needs to stay at the hospital during part of their recovery, the visitor guidelines for inpatient rooms apply.  Please refer to the [facility] website for the visitor guidelines for any additional information.   If you received a COVID test during your pre-op visit  it is requested that you wear a mask when out in public, stay away from anyone that may not be feeling well and notify your surgeon if you develop symptoms. If you have been in contact with anyone that has tested positive in the last 10 days please notify you surgeon.      Pre-operative CHG Bathing Instructions   You can play a key role in reducing the risk of infection after surgery. Your skin needs to be as free of germs as possible. You can reduce the number of germs on your skin by washing with CHG (chlorhexidine  gluconate) soap before surgery. CHG is an antiseptic soap that kills germs and continues to kill germs even after washing.   DO NOT use if you have an allergy to chlorhexidine /CHG or antibacterial soaps. If your skin becomes reddened or irritated, stop using the CHG and notify one of our RNs at  [phone].              TAKE A SHOWER THE NIGHT BEFORE SURGERY   Please keep in mind the following:  DO NOT shave, including legs and underarms, 48 hours prior to surgery.   You may shave your face before/day of surgery.  Place clean sheets on your bed the night before surgery Use a clean washcloth (not used since being washed) for shower. DO NOT sleep with pet's night before surgery.  CHG Shower Instructions:  Wash your face and private area with normal soap. If you choose to wash your hair, wash first with your normal shampoo.  After  you use shampoo/soap, rinse your hair and body thoroughly to remove shampoo/soap residue.  Turn the water  OFF and apply half the bottle of CHG soap to a CLEAN washcloth.  Apply CHG soap ONLY FROM YOUR NECK DOWN TO YOUR TOES (washing for 3-5 minutes)  DO NOT use CHG soap on face, private areas, open wounds, or sores.  Pay special attention to the area where your surgery is being performed.  If you are having back surgery, having someone wash your back for you may be helpful. Wait 2 minutes after CHG soap is applied, then you may rinse off the CHG soap.  Pat dry with a clean towel  Put on clean pajamas    Additional instructions for the day of surgery: If you choose, you may shower the morning of surgery with an antibacterial soap.  DO NOT APPLY any lotions, deodorants, cologne, or perfumes.   Do not wear jewelry or makeup Do not wear nail polish, gel polish, artificial nails, or any other type of covering on natural nails (fingers and toes) Do not bring valuables to the hospital. [facility] is not responsible for valuables/personal belongings. Put on clean/comfortable clothes.  Please brush your teeth.  Ask your nurse before applying any prescription medications to the skin.

## 2024-07-23 ENCOUNTER — Other Ambulatory Visit: Payer: Self-pay

## 2024-07-23 ENCOUNTER — Ambulatory Visit (HOSPITAL_COMMUNITY)

## 2024-07-23 ENCOUNTER — Encounter (HOSPITAL_COMMUNITY): Payer: Self-pay

## 2024-07-23 ENCOUNTER — Encounter (HOSPITAL_COMMUNITY)
Admission: RE | Admit: 2024-07-23 | Discharge: 2024-07-23 | Disposition: A | Discharge status: Home / Self Care | Source: Ambulatory Visit | Attending: Vascular Surgery | Admitting: Vascular Surgery

## 2024-07-23 VITALS — BP 108/46 | HR 62 | Temp 98.2°F | Resp 17 | Ht 63.0 in | Wt 110.0 lb

## 2024-07-23 DIAGNOSIS — I70229 Atherosclerosis of native arteries of extremities with rest pain, unspecified extremity: Secondary | ICD-10-CM | POA: Diagnosis not present

## 2024-07-23 DIAGNOSIS — Z01818 Encounter for other preprocedural examination: Secondary | ICD-10-CM | POA: Diagnosis not present

## 2024-07-23 DIAGNOSIS — Z01810 Encounter for preprocedural cardiovascular examination: Secondary | ICD-10-CM | POA: Diagnosis present

## 2024-07-23 DIAGNOSIS — Z01812 Encounter for preprocedural laboratory examination: Secondary | ICD-10-CM | POA: Diagnosis present

## 2024-07-23 HISTORY — DX: Atherosclerotic heart disease of native coronary artery without angina pectoris: I25.10

## 2024-07-23 LAB — COMPREHENSIVE METABOLIC PANEL WITH GFR
ALT: 16 U/L (ref 0–44)
AST: 28 U/L (ref 15–41)
Albumin: 4.2 g/dL (ref 3.5–5.0)
Alkaline Phosphatase: 37 U/L — ABNORMAL LOW (ref 38–126)
Anion gap: 9 (ref 5–15)
BUN: 28 mg/dL — ABNORMAL HIGH (ref 8–23)
CO2: 23 mmol/L (ref 22–32)
Calcium: 9.4 mg/dL (ref 8.9–10.3)
Chloride: 89 mmol/L — ABNORMAL LOW (ref 98–111)
Creatinine, Ser: 0.88 mg/dL (ref 0.44–1.00)
GFR, Estimated: >60 mL/min
Glucose, Bld: 103 mg/dL — ABNORMAL HIGH (ref 70–99)
Potassium: 5.3 mmol/L — ABNORMAL HIGH (ref 3.5–5.1)
Sodium: 121 mmol/L — ABNORMAL LOW (ref 135–145)
Total Bilirubin: 0.4 mg/dL (ref 0.0–1.2)
Total Protein: 6.4 g/dL — ABNORMAL LOW (ref 6.5–8.1)

## 2024-07-23 LAB — CBC
HCT: 27.4 % — ABNORMAL LOW (ref 36.0–46.0)
Hemoglobin: 9.5 g/dL — ABNORMAL LOW (ref 12.0–15.0)
MCH: 34.3 pg — ABNORMAL HIGH (ref 26.0–34.0)
MCHC: 34.7 g/dL (ref 30.0–36.0)
MCV: 98.9 fL (ref 80.0–100.0)
Platelets: 368 K/uL (ref 150–400)
RBC: 2.77 MIL/uL — ABNORMAL LOW (ref 3.87–5.11)
RDW: 13.7 % (ref 11.5–15.5)
WBC: 11.4 K/uL — ABNORMAL HIGH (ref 4.0–10.5)
nRBC: 0 % (ref 0.0–0.2)

## 2024-07-23 LAB — URINALYSIS, ROUTINE W REFLEX MICROSCOPIC
Bilirubin Urine: NEGATIVE
Glucose, UA: >=500 mg/dL — AB
Hgb urine dipstick: NEGATIVE
Ketones, ur: NEGATIVE mg/dL
Leukocytes,Ua: NEGATIVE
Nitrite: NEGATIVE
Protein, ur: NEGATIVE mg/dL
Specific Gravity, Urine: 1.023 (ref 1.005–1.030)
pH: 5 (ref 5.0–8.0)

## 2024-07-23 LAB — PROTIME-INR
INR: 1 (ref 0.8–1.2)
Prothrombin Time: 13.7 s (ref 11.4–15.2)

## 2024-07-23 LAB — SURGICAL PCR SCREEN
MRSA, PCR: NEGATIVE
Staphylococcus aureus: NEGATIVE

## 2024-07-23 LAB — APTT: aPTT: 29 s (ref 24–36)

## 2024-07-23 NOTE — Progress Notes (Signed)
 PCP - Andrew Truman GRADE., MD  Cardiologist - Verlin Lonni BIRCH, MD  EP- Inocencio Soyla Lunger, MD   PPM/ICD - Medtronic ICD- orders in Epic. Email sent to device rep to notify or orders    Chest x-ray - N/A EKG - 07/23/2024  Stress Test - pt reports stress test a long time ago ECHO - 07/02/24 Cardiac Cath - 04/29/22 [facility], 08/11/22 [facility]  Sleep Study - denies   Fasting Blood Sugar - N/A  Last dose of GLP1 agonist-  N/A   Blood Thinner Instructions: Last dose Plavix  07/23/24- pt aware Aspirin  Instructions: Continue ASA  ERAS Protcol - NPO order   COVID TEST-  N/A   Anesthesia review: Pt c/o new chest pain the past few days when she lies down. Pt states she took 1 nitroglycerin  that made this pain improve slightly, but the pain did not go away until she sat up. Pt describes this pain as burning and denies any other symptoms with this pain. Pt states this pain is different than chest pain she has had in the past. Dr Keneth notified and EKG performed at PAT appointment. Pt does have recent cardiology note 07/16/24 for clearance.   Patient denies shortness of breath, fever, cough and chest pain at PAT appointment   All instructions explained to the patient, with a verbal understanding of the material. Patient agrees to go over the instructions while at home for a better understanding. The opportunity to ask questions was provided.

## 2024-07-24 ENCOUNTER — Other Ambulatory Visit: Payer: Self-pay

## 2024-07-24 ENCOUNTER — Telehealth: Payer: Self-pay

## 2024-07-24 ENCOUNTER — Encounter (HOSPITAL_COMMUNITY): Payer: Self-pay | Admitting: *Deleted

## 2024-07-24 ENCOUNTER — Ambulatory Visit
Admission: EM | Admit: 2024-07-24 | Discharge: 2024-07-24 | Disposition: A | Discharge status: Home / Self Care | Attending: Family Medicine | Admitting: Family Medicine

## 2024-07-24 ENCOUNTER — Inpatient Hospital Stay (HOSPITAL_COMMUNITY)
Admission: EM | Admit: 2024-07-24 | Discharge: 2024-08-14 | DRG: 252 | Disposition: A | Discharge status: Hospice | Source: Ambulatory Visit | Attending: Internal Medicine | Admitting: Internal Medicine

## 2024-07-24 ENCOUNTER — Emergency Department (HOSPITAL_COMMUNITY)

## 2024-07-24 DIAGNOSIS — I70212 Atherosclerosis of native arteries of extremities with intermittent claudication, left leg: Secondary | ICD-10-CM | POA: Diagnosis not present

## 2024-07-24 DIAGNOSIS — B965 Pseudomonas (aeruginosa) (mallei) (pseudomallei) as the cause of diseases classified elsewhere: Secondary | ICD-10-CM | POA: Diagnosis present

## 2024-07-24 DIAGNOSIS — L97419 Non-pressure chronic ulcer of right heel and midfoot with unspecified severity: Secondary | ICD-10-CM | POA: Diagnosis present

## 2024-07-24 DIAGNOSIS — I5023 Acute on chronic systolic (congestive) heart failure: Secondary | ICD-10-CM | POA: Diagnosis present

## 2024-07-24 DIAGNOSIS — E872 Acidosis, unspecified: Secondary | ICD-10-CM | POA: Diagnosis not present

## 2024-07-24 DIAGNOSIS — I471 Supraventricular tachycardia, unspecified: Secondary | ICD-10-CM | POA: Diagnosis not present

## 2024-07-24 DIAGNOSIS — R54 Age-related physical debility: Secondary | ICD-10-CM | POA: Diagnosis present

## 2024-07-24 DIAGNOSIS — E871 Hypo-osmolality and hyponatremia: Secondary | ICD-10-CM | POA: Diagnosis not present

## 2024-07-24 DIAGNOSIS — Z66 Do not resuscitate: Secondary | ICD-10-CM | POA: Diagnosis not present

## 2024-07-24 DIAGNOSIS — J449 Chronic obstructive pulmonary disease, unspecified: Secondary | ICD-10-CM | POA: Diagnosis not present

## 2024-07-24 DIAGNOSIS — J95821 Acute postprocedural respiratory failure: Secondary | ICD-10-CM | POA: Diagnosis not present

## 2024-07-24 DIAGNOSIS — D649 Anemia, unspecified: Secondary | ICD-10-CM | POA: Diagnosis not present

## 2024-07-24 DIAGNOSIS — E785 Hyperlipidemia, unspecified: Secondary | ICD-10-CM | POA: Diagnosis present

## 2024-07-24 DIAGNOSIS — I70291 Other atherosclerosis of native arteries of extremities, right leg: Secondary | ICD-10-CM | POA: Diagnosis not present

## 2024-07-24 DIAGNOSIS — J439 Emphysema, unspecified: Secondary | ICD-10-CM | POA: Diagnosis present

## 2024-07-24 DIAGNOSIS — I472 Ventricular tachycardia, unspecified: Secondary | ICD-10-CM | POA: Diagnosis not present

## 2024-07-24 DIAGNOSIS — R569 Unspecified convulsions: Secondary | ICD-10-CM | POA: Diagnosis not present

## 2024-07-24 DIAGNOSIS — Z1611 Resistance to penicillins: Secondary | ICD-10-CM | POA: Diagnosis present

## 2024-07-24 DIAGNOSIS — E039 Hypothyroidism, unspecified: Secondary | ICD-10-CM | POA: Diagnosis present

## 2024-07-24 DIAGNOSIS — R64 Cachexia: Secondary | ICD-10-CM | POA: Diagnosis present

## 2024-07-24 DIAGNOSIS — Z515 Encounter for palliative care: Secondary | ICD-10-CM | POA: Diagnosis not present

## 2024-07-24 DIAGNOSIS — E440 Moderate protein-calorie malnutrition: Secondary | ICD-10-CM | POA: Diagnosis present

## 2024-07-24 DIAGNOSIS — J189 Pneumonia, unspecified organism: Secondary | ICD-10-CM | POA: Diagnosis not present

## 2024-07-24 DIAGNOSIS — I70223 Atherosclerosis of native arteries of extremities with rest pain, bilateral legs: Secondary | ICD-10-CM | POA: Diagnosis present

## 2024-07-24 DIAGNOSIS — D62 Acute posthemorrhagic anemia: Secondary | ICD-10-CM | POA: Diagnosis not present

## 2024-07-24 DIAGNOSIS — Z9841 Cataract extraction status, right eye: Secondary | ICD-10-CM

## 2024-07-24 DIAGNOSIS — J432 Centrilobular emphysema: Secondary | ICD-10-CM

## 2024-07-24 DIAGNOSIS — Y95 Nosocomial condition: Secondary | ICD-10-CM | POA: Diagnosis not present

## 2024-07-24 DIAGNOSIS — I998 Other disorder of circulatory system: Secondary | ICD-10-CM | POA: Diagnosis present

## 2024-07-24 DIAGNOSIS — Z8419 Family history of other disorders of kidney and ureter: Secondary | ICD-10-CM

## 2024-07-24 DIAGNOSIS — Z8249 Family history of ischemic heart disease and other diseases of the circulatory system: Secondary | ICD-10-CM

## 2024-07-24 DIAGNOSIS — J441 Chronic obstructive pulmonary disease with (acute) exacerbation: Secondary | ICD-10-CM | POA: Diagnosis not present

## 2024-07-24 DIAGNOSIS — J984 Other disorders of lung: Secondary | ICD-10-CM | POA: Diagnosis not present

## 2024-07-24 DIAGNOSIS — J9601 Acute respiratory failure with hypoxia: Secondary | ICD-10-CM

## 2024-07-24 DIAGNOSIS — Z1152 Encounter for screening for COVID-19: Secondary | ICD-10-CM

## 2024-07-24 DIAGNOSIS — I10 Essential (primary) hypertension: Secondary | ICD-10-CM | POA: Diagnosis present

## 2024-07-24 DIAGNOSIS — Z9842 Cataract extraction status, left eye: Secondary | ICD-10-CM

## 2024-07-24 DIAGNOSIS — R0603 Acute respiratory distress: Secondary | ICD-10-CM | POA: Diagnosis not present

## 2024-07-24 DIAGNOSIS — J90 Pleural effusion, not elsewhere classified: Secondary | ICD-10-CM | POA: Diagnosis not present

## 2024-07-24 DIAGNOSIS — R55 Syncope and collapse: Secondary | ICD-10-CM | POA: Diagnosis not present

## 2024-07-24 DIAGNOSIS — R7989 Other specified abnormal findings of blood chemistry: Secondary | ICD-10-CM | POA: Diagnosis not present

## 2024-07-24 DIAGNOSIS — B961 Klebsiella pneumoniae [K. pneumoniae] as the cause of diseases classified elsewhere: Secondary | ICD-10-CM | POA: Diagnosis not present

## 2024-07-24 DIAGNOSIS — Z95810 Presence of automatic (implantable) cardiac defibrillator: Secondary | ICD-10-CM

## 2024-07-24 DIAGNOSIS — I70229 Atherosclerosis of native arteries of extremities with rest pain, unspecified extremity: Secondary | ICD-10-CM

## 2024-07-24 DIAGNOSIS — R042 Hemoptysis: Secondary | ICD-10-CM | POA: Diagnosis not present

## 2024-07-24 DIAGNOSIS — I82A12 Acute embolism and thrombosis of left axillary vein: Secondary | ICD-10-CM | POA: Diagnosis present

## 2024-07-24 DIAGNOSIS — I5022 Chronic systolic (congestive) heart failure: Secondary | ICD-10-CM

## 2024-07-24 DIAGNOSIS — I272 Pulmonary hypertension, unspecified: Secondary | ICD-10-CM | POA: Diagnosis present

## 2024-07-24 DIAGNOSIS — Z6820 Body mass index (BMI) 20.0-20.9, adult: Secondary | ICD-10-CM

## 2024-07-24 DIAGNOSIS — Z7982 Long term (current) use of aspirin: Secondary | ICD-10-CM

## 2024-07-24 DIAGNOSIS — I871 Compression of vein: Secondary | ICD-10-CM | POA: Diagnosis not present

## 2024-07-24 DIAGNOSIS — G8929 Other chronic pain: Secondary | ICD-10-CM | POA: Diagnosis present

## 2024-07-24 DIAGNOSIS — I214 Non-ST elevation (NSTEMI) myocardial infarction: Secondary | ICD-10-CM | POA: Diagnosis present

## 2024-07-24 DIAGNOSIS — I509 Heart failure, unspecified: Principal | ICD-10-CM

## 2024-07-24 DIAGNOSIS — J810 Acute pulmonary edema: Secondary | ICD-10-CM | POA: Diagnosis not present

## 2024-07-24 DIAGNOSIS — R739 Hyperglycemia, unspecified: Secondary | ICD-10-CM | POA: Diagnosis not present

## 2024-07-24 DIAGNOSIS — I5020 Unspecified systolic (congestive) heart failure: Secondary | ICD-10-CM | POA: Diagnosis not present

## 2024-07-24 DIAGNOSIS — I82612 Acute embolism and thrombosis of superficial veins of left upper extremity: Secondary | ICD-10-CM | POA: Diagnosis present

## 2024-07-24 DIAGNOSIS — Z79890 Hormone replacement therapy (postmenopausal): Secondary | ICD-10-CM

## 2024-07-24 DIAGNOSIS — Z95828 Presence of other vascular implants and grafts: Secondary | ICD-10-CM | POA: Diagnosis not present

## 2024-07-24 DIAGNOSIS — I9581 Postprocedural hypotension: Secondary | ICD-10-CM | POA: Diagnosis not present

## 2024-07-24 DIAGNOSIS — Z955 Presence of coronary angioplasty implant and graft: Secondary | ICD-10-CM

## 2024-07-24 DIAGNOSIS — R0902 Hypoxemia: Secondary | ICD-10-CM | POA: Diagnosis not present

## 2024-07-24 DIAGNOSIS — L89323 Pressure ulcer of left buttock, stage 3: Secondary | ICD-10-CM | POA: Diagnosis present

## 2024-07-24 DIAGNOSIS — M7989 Other specified soft tissue disorders: Secondary | ICD-10-CM | POA: Diagnosis not present

## 2024-07-24 DIAGNOSIS — I255 Ischemic cardiomyopathy: Secondary | ICD-10-CM | POA: Diagnosis not present

## 2024-07-24 DIAGNOSIS — J150 Pneumonia due to Klebsiella pneumoniae: Secondary | ICD-10-CM | POA: Diagnosis not present

## 2024-07-24 DIAGNOSIS — L89152 Pressure ulcer of sacral region, stage 2: Secondary | ICD-10-CM | POA: Diagnosis present

## 2024-07-24 DIAGNOSIS — Z7902 Long term (current) use of antithrombotics/antiplatelets: Secondary | ICD-10-CM | POA: Diagnosis not present

## 2024-07-24 DIAGNOSIS — R32 Unspecified urinary incontinence: Secondary | ICD-10-CM | POA: Diagnosis present

## 2024-07-24 DIAGNOSIS — E876 Hypokalemia: Secondary | ICD-10-CM | POA: Diagnosis not present

## 2024-07-24 DIAGNOSIS — E030 Congenital hypothyroidism with diffuse goiter: Secondary | ICD-10-CM

## 2024-07-24 DIAGNOSIS — I2510 Atherosclerotic heart disease of native coronary artery without angina pectoris: Secondary | ICD-10-CM | POA: Diagnosis not present

## 2024-07-24 DIAGNOSIS — E878 Other disorders of electrolyte and fluid balance, not elsewhere classified: Secondary | ICD-10-CM | POA: Diagnosis not present

## 2024-07-24 DIAGNOSIS — L8990 Pressure ulcer of unspecified site, unspecified stage: Secondary | ICD-10-CM | POA: Diagnosis present

## 2024-07-24 DIAGNOSIS — J440 Chronic obstructive pulmonary disease with acute lower respiratory infection: Secondary | ICD-10-CM | POA: Diagnosis not present

## 2024-07-24 DIAGNOSIS — I739 Peripheral vascular disease, unspecified: Secondary | ICD-10-CM

## 2024-07-24 DIAGNOSIS — I70221 Atherosclerosis of native arteries of extremities with rest pain, right leg: Secondary | ICD-10-CM | POA: Diagnosis present

## 2024-07-24 DIAGNOSIS — I110 Hypertensive heart disease with heart failure: Secondary | ICD-10-CM | POA: Diagnosis present

## 2024-07-24 DIAGNOSIS — Z87891 Personal history of nicotine dependence: Secondary | ICD-10-CM

## 2024-07-24 DIAGNOSIS — Z79899 Other long term (current) drug therapy: Secondary | ICD-10-CM

## 2024-07-24 DIAGNOSIS — I25118 Atherosclerotic heart disease of native coronary artery with other forms of angina pectoris: Secondary | ICD-10-CM

## 2024-07-24 LAB — CBC WITH DIFFERENTIAL/PLATELET
Abs Immature Granulocytes: 0.06 K/uL (ref 0.00–0.07)
Basophils Absolute: 0 K/uL (ref 0.0–0.1)
Basophils Relative: 0 %
Eosinophils Absolute: 0 K/uL (ref 0.0–0.5)
Eosinophils Relative: 0 %
HCT: 24 % — ABNORMAL LOW (ref 36.0–46.0)
Hemoglobin: 8.3 g/dL — ABNORMAL LOW (ref 12.0–15.0)
Immature Granulocytes: 1 %
Lymphocytes Relative: 17 %
Lymphs Abs: 1.4 K/uL (ref 0.7–4.0)
MCH: 34.4 pg — ABNORMAL HIGH (ref 26.0–34.0)
MCHC: 34.6 g/dL (ref 30.0–36.0)
MCV: 99.6 fL (ref 80.0–100.0)
Monocytes Absolute: 0.8 K/uL (ref 0.1–1.0)
Monocytes Relative: 10 %
Neutro Abs: 5.9 K/uL (ref 1.7–7.7)
Neutrophils Relative %: 72 %
Platelets: 379 K/uL (ref 150–400)
RBC: 2.41 MIL/uL — ABNORMAL LOW (ref 3.87–5.11)
RDW: 14.1 % (ref 11.5–15.5)
WBC: 8.2 K/uL (ref 4.0–10.5)
nRBC: 0 % (ref 0.0–0.2)

## 2024-07-24 LAB — TYPE AND SCREEN
ABO/RH(D): A POS
Antibody Screen: NEGATIVE

## 2024-07-24 LAB — CBC
HCT: 26.2 % — ABNORMAL LOW (ref 36.0–46.0)
Hemoglobin: 9 g/dL — ABNORMAL LOW (ref 12.0–15.0)
MCH: 34.1 pg — ABNORMAL HIGH (ref 26.0–34.0)
MCHC: 34.4 g/dL (ref 30.0–36.0)
MCV: 99.2 fL (ref 80.0–100.0)
Platelets: 390 K/uL (ref 150–400)
RBC: 2.64 MIL/uL — ABNORMAL LOW (ref 3.87–5.11)
RDW: 14.1 % (ref 11.5–15.5)
WBC: 10.9 K/uL — ABNORMAL HIGH (ref 4.0–10.5)
nRBC: 0.2 % (ref 0.0–0.2)

## 2024-07-24 LAB — COMPREHENSIVE METABOLIC PANEL WITH GFR
ALT: 23 U/L (ref 0–44)
AST: 56 U/L — ABNORMAL HIGH (ref 15–41)
Albumin: 4.1 g/dL (ref 3.5–5.0)
Alkaline Phosphatase: 39 U/L (ref 38–126)
Anion gap: 16 — ABNORMAL HIGH (ref 5–15)
BUN: 34 mg/dL — ABNORMAL HIGH (ref 8–23)
CO2: 15 mmol/L — ABNORMAL LOW (ref 22–32)
Calcium: 10.1 mg/dL (ref 8.9–10.3)
Chloride: 86 mmol/L — ABNORMAL LOW (ref 98–111)
Creatinine, Ser: 0.99 mg/dL (ref 0.44–1.00)
GFR, Estimated: 60 mL/min — ABNORMAL LOW
Glucose, Bld: 120 mg/dL — ABNORMAL HIGH (ref 70–99)
Potassium: 5.1 mmol/L (ref 3.5–5.1)
Sodium: 117 mmol/L — CL (ref 135–145)
Total Bilirubin: 0.4 mg/dL (ref 0.0–1.2)
Total Protein: 6.3 g/dL — ABNORMAL LOW (ref 6.5–8.1)

## 2024-07-24 LAB — PROTIME-INR
INR: 1.1 (ref 0.8–1.2)
Prothrombin Time: 14.6 s (ref 11.4–15.2)

## 2024-07-24 LAB — RESP PANEL BY RT-PCR (RSV, FLU A&B, COVID)  RVPGX2
Influenza A by PCR: NEGATIVE
Influenza B by PCR: NEGATIVE
Resp Syncytial Virus by PCR: NEGATIVE
SARS Coronavirus 2 by RT PCR: NEGATIVE

## 2024-07-24 LAB — BASIC METABOLIC PANEL WITH GFR
Anion gap: 15 (ref 5–15)
BUN: 32 mg/dL — ABNORMAL HIGH (ref 8–23)
CO2: 17 mmol/L — ABNORMAL LOW (ref 22–32)
Calcium: 9.5 mg/dL (ref 8.9–10.3)
Chloride: 88 mmol/L — ABNORMAL LOW (ref 98–111)
Creatinine, Ser: 0.97 mg/dL (ref 0.44–1.00)
GFR, Estimated: >60 mL/min
Glucose, Bld: 130 mg/dL — ABNORMAL HIGH (ref 70–99)
Potassium: 4.4 mmol/L (ref 3.5–5.1)
Sodium: 119 mmol/L — CL (ref 135–145)

## 2024-07-24 LAB — TROPONIN T, HIGH SENSITIVITY: Troponin T High Sensitivity: 160 ng/L (ref 0–19)

## 2024-07-24 LAB — PRO BRAIN NATRIURETIC PEPTIDE: Pro Brain Natriuretic Peptide: 4537 pg/mL — ABNORMAL HIGH

## 2024-07-24 LAB — PROCALCITONIN: Procalcitonin: <0.1 ng/mL

## 2024-07-24 LAB — TSH: TSH: 6.89 u[IU]/mL — ABNORMAL HIGH (ref 0.350–4.500)

## 2024-07-24 MED ORDER — RANOLAZINE ER 500 MG PO TB12
1000.0000 mg | ORAL_TABLET | Freq: Two times a day (BID) | ORAL | Status: DC
  Administered 2024-07-25 – 2024-08-12 (×36): 1000 mg via ORAL
  Filled 2024-07-24 (×25): qty 2

## 2024-07-24 MED ORDER — CLOPIDOGREL BISULFATE 75 MG PO TABS
75.0000 mg | ORAL_TABLET | Freq: Every day | ORAL | Status: DC
  Administered 2024-07-25: 75 mg via ORAL
  Filled 2024-07-24: qty 1

## 2024-07-24 MED ORDER — FUROSEMIDE 10 MG/ML IJ SOLN: 40.0000 mg | Freq: Two times a day (BID) | INTRAMUSCULAR | Status: DC

## 2024-07-24 MED ORDER — SODIUM CHLORIDE 0.9 % IV SOLN: 250.0000 mL | INTRAVENOUS | Status: AC | PRN

## 2024-07-24 MED ORDER — MORPHINE SULFATE (PF) 4 MG/ML IV SOLN
4.0000 mg | Freq: Once | INTRAVENOUS | Status: AC
  Administered 2024-07-24: 4 mg via INTRAVENOUS
  Filled 2024-07-24: qty 1

## 2024-07-24 MED ORDER — ATORVASTATIN CALCIUM 80 MG PO TABS
80.0000 mg | ORAL_TABLET | Freq: Every day | ORAL | Status: DC
  Administered 2024-07-25 – 2024-08-13 (×19): 80 mg via ORAL
  Filled 2024-07-24: qty 2
  Filled 2024-07-24 (×2): qty 1
  Filled 2024-07-24: qty 2
  Filled 2024-07-24 (×4): qty 1
  Filled 2024-07-24: qty 8
  Filled 2024-07-24: qty 1
  Filled 2024-07-24: qty 2
  Filled 2024-07-24 (×2): qty 1

## 2024-07-24 MED ORDER — OXYCODONE-ACETAMINOPHEN 5-325 MG PO TABS
1.0000 | ORAL_TABLET | Freq: Four times a day (QID) | ORAL | Status: DC | PRN
  Administered 2024-07-24 – 2024-08-05 (×23): 1 via ORAL
  Filled 2024-07-24 (×11): qty 1

## 2024-07-24 MED ORDER — SODIUM CHLORIDE 0.9% FLUSH: 3.0000 mL | INTRAVENOUS | Status: DC | PRN

## 2024-07-24 MED ORDER — DIGOXIN 125 MCG PO TABS
0.0625 mg | ORAL_TABLET | Freq: Every day | ORAL | Status: DC
  Administered 2024-07-25 – 2024-08-05 (×11): 0.0625 mg via ORAL
  Filled 2024-07-24 (×5): qty 1

## 2024-07-24 MED ORDER — FUROSEMIDE 10 MG/ML IJ SOLN
40.0000 mg | Freq: Once | INTRAMUSCULAR | Status: AC
  Administered 2024-07-24: 40 mg via INTRAVENOUS
  Filled 2024-07-24: qty 4

## 2024-07-24 MED ORDER — LEVOTHYROXINE SODIUM 75 MCG PO TABS
37.5000 ug | ORAL_TABLET | Freq: Every day | ORAL | Status: DC
  Administered 2024-07-25 – 2024-08-13 (×20): 37.5 ug via ORAL
  Filled 2024-07-24 (×13): qty 1

## 2024-07-24 MED ORDER — SPIRONOLACTONE 12.5 MG HALF TABLET
25.0000 mg | ORAL_TABLET | Freq: Every day | ORAL | Status: DC
  Administered 2024-07-25: 25 mg via ORAL
  Filled 2024-07-24: qty 2

## 2024-07-24 MED ORDER — SODIUM CHLORIDE 0.9% FLUSH
3.0000 mL | Freq: Two times a day (BID) | INTRAVENOUS | Status: DC
  Administered 2024-07-24 – 2024-08-13 (×38): 3 mL via INTRAVENOUS

## 2024-07-24 MED ORDER — ONDANSETRON HCL 4 MG/2ML IJ SOLN
4.0000 mg | Freq: Four times a day (QID) | INTRAMUSCULAR | Status: DC | PRN
  Administered 2024-08-06: 4 mg via INTRAVENOUS
  Filled 2024-07-24: qty 2

## 2024-07-24 MED ORDER — IPRATROPIUM-ALBUTEROL 0.5-2.5 (3) MG/3ML IN SOLN
3.0000 mL | Freq: Once | RESPIRATORY_TRACT | Status: AC
  Administered 2024-07-24: 3 mL via RESPIRATORY_TRACT
  Filled 2024-07-24: qty 3

## 2024-07-24 MED ORDER — UMECLIDINIUM-VILANTEROL 62.5-25 MCG/ACT IN AEPB
1.0000 | INHALATION_SPRAY | Freq: Every day | RESPIRATORY_TRACT | Status: DC
  Administered 2024-07-25 – 2024-08-05 (×12): 1 via RESPIRATORY_TRACT
  Filled 2024-07-24 (×2): qty 14

## 2024-07-24 MED ORDER — BISOPROLOL FUMARATE 5 MG PO TABS
2.5000 mg | ORAL_TABLET | Freq: Every day | ORAL | Status: DC
  Administered 2024-07-24 – 2024-07-25 (×2): 2.5 mg via ORAL
  Filled 2024-07-24 (×2): qty 1

## 2024-07-24 MED ORDER — ENOXAPARIN SODIUM 40 MG/0.4ML IJ SOSY
40.0000 mg | PREFILLED_SYRINGE | INTRAMUSCULAR | Status: DC
  Administered 2024-07-24 – 2024-07-28 (×5): 40 mg via SUBCUTANEOUS
  Filled 2024-07-24 (×5): qty 0.4

## 2024-07-24 MED ORDER — ACETAMINOPHEN 325 MG PO TABS
650.0000 mg | ORAL_TABLET | ORAL | Status: DC | PRN
  Administered 2024-07-26 – 2024-08-11 (×7): 650 mg via ORAL
  Filled 2024-07-24 (×4): qty 2

## 2024-07-24 NOTE — ED Notes (Signed)
u

## 2024-07-24 NOTE — ED Notes (Signed)
 Patient is being discharged from the Urgent Care and sent to the Emergency Department via POV . Per Elsa PA C, patient is in need of higher level of care due to weakness and multiple abnormal lab results. Patient is aware and verbalizes understanding of plan of care. There were no vitals filed for this visit.

## 2024-07-24 NOTE — ED Provider Notes (Signed)
 " [place] EMERGENCY DEPARTMENT AT [facility] Provider Note   CSN: [id number] Arrival date & time: 07/24/24  9044     Patient presents with: Shortness of Breath   Pam [name] is a 73 y.o. female.  She has fairly significant chronic medical issues including coronary disease heart failure peripheral vascular disease.  She just saw cardiology and vascular yesterday and had preop labs done.  She is anticipated for surgery on Monday.  She received a call today saying her labs were abnormal and she needed to come to the emergency department for further evaluation.  She has chronic pain in her legs right greater than left.  For the last week or so she has been more short of breath and last night and today she had some nausea and diaphoresis.  Dry heaves.  No chest pain currently.  No fevers chills.  Has not seen any bleeding.  No diarrhea or urinary symptoms.  Patient is on Plavix  and stopped it today for expected surgery.   The history is provided by the patient.  Shortness of Breath Severity:  Moderate Onset quality:  Gradual Duration:  1 week Timing:  Constant Progression:  Unchanged Relieved by:  Nothing Worsened by:  Activity Ineffective treatments:  Rest Associated symptoms: diaphoresis and vomiting   Associated symptoms: no abdominal pain, no chest pain, no cough, no fever, no hemoptysis and no sputum production        Prior to Admission medications  Medication Sig Start Date End Date Taking? Authorizing Provider  acetaminophen  (TYLENOL ) 500 MG tablet Take 1,000 mg by mouth every 6 (six) hours as needed for moderate pain.    [provider]  albuterol  (VENTOLIN  HFA) 108 (90 Base) MCG/ACT inhaler Inhale 2 puffs into the lungs every 6 (six) hours as needed for wheezing or shortness of breath. 12/28/23   Hope Almarie ORN, NP  aspirin  81 MG chewable tablet Chew 81 mg by mouth daily.    [provider]  atorvastatin  (LIPITOR ) 80 MG tablet TAKE 1 TABLET(80  MG) BY MOUTH DAILY 11/27/18   Verlin Lonni BIRCH, MD  bisoprolol  (ZEBETA ) 5 MG tablet Take 0.5 tablets (2.5 mg total) by mouth daily. 06/26/24   Glena Harlene HERO, FNP  CALCIUM  PO Take 1 tablet by mouth daily.    [provider]  Cholecalciferol (VITAMIN D3) 50 MCG (2000 UT) TABS Take 2,000 Units by mouth daily.    [provider]  clopidogrel  (PLAVIX ) 75 MG tablet TAKE 1 TABLET(75 MG) BY MOUTH DAILY WITH BREAKFAST 11/27/23   Sabharwal, Aditya, DO  digoxin  (LANOXIN ) 0.125 MG tablet Take 0.5 tablets (0.0625 mg total) by mouth daily. 11/22/23   Glena Harlene HERO, FNP  furosemide  (LASIX ) 20 MG tablet TAKE 1 TABLET BY MOUTH AS NEEDED 02/27/24   Sabharwal, Aditya, DO  levothyroxine  (SYNTHROID ) 25 MCG tablet Take 37.5 mcg by mouth daily before breakfast. 07/10/23   Thapa, Sudan, MD  nitroGLYCERIN  (NITROSTAT ) 0.4 MG SL tablet Place 1 tablet (0.4 mg total) under the tongue every 5 (five) minutes x 3 doses as needed for chest pain. 06/14/24   Verlin Lonni BIRCH, MD  nystatin cream (MYCOSTATIN) Apply 1 Application topically 2 (two) times daily as needed (rash). 05/09/22   [provider]  ranolazine  (RANEXA ) 1000 MG SR tablet TAKE 1 TABLET(1000 MG) BY MOUTH TWICE DAILY 02/09/24   Milford, Jessica M, FNP  sacubitril -valsartan  (ENTRESTO ) 49-51 MG Take 1 tablet by mouth 2 (two) times daily. 05/01/24   Lenetta Greig BIRCH, NP  spironolactone  (ALDACTONE ) 25 MG tablet TAKE 1 TABLET(25 MG) BY MOUTH DAILY 07/03/23   Sabharwal, Aditya, DO  umeclidinium-vilanterol (ANORO ELLIPTA ) 62.5-25 MCG/ACT AEPB Inhale 1 puff into the lungs daily. 12/28/23   Hope Almarie ORN, NP  valACYclovir  (VALTREX ) 1000 MG tablet Take 1,000 mg by mouth daily.    [provider]    Allergies: Lisinopril     Review of Systems  Constitutional:  Positive for diaphoresis. Negative for fever.  Respiratory:  Positive for shortness of breath. Negative for cough, hemoptysis and sputum production.   Cardiovascular:   Negative for chest pain.  Gastrointestinal:  Positive for vomiting. Negative for abdominal pain.    Updated Vital Signs BP (!) 118/53 (BP Location: Right Arm)   Pulse 77   Temp 98.2 F (36.8 C) (Temporal)   Resp (!) 24   SpO2 94%   Physical Exam Vitals and nursing note reviewed.  Constitutional:      General: She is not in acute distress.    Appearance: Normal appearance. She is well-developed.  HENT:     Head: Normocephalic and atraumatic.  Eyes:     Conjunctiva/sclera: Conjunctivae normal.  Cardiovascular:     Rate and Rhythm: Normal rate and regular rhythm.     Heart sounds: No murmur heard. Pulmonary:     Effort: Pulmonary effort is normal. No respiratory distress.     Breath sounds: Normal breath sounds. No stridor. No wheezing.  Abdominal:     Palpations: Abdomen is soft.     Tenderness: There is no abdominal tenderness. There is no guarding or rebound.  Musculoskeletal:        General: No tenderness or deformity.     Cervical back: Neck supple.     Right lower leg: Edema present.     Left lower leg: Edema present.     Comments: Right lower extremity is markedly cool.  There is no cyanosis however.  Skin:    General: Skin is warm and dry.  Neurological:     General: No focal deficit present.     Mental Status: She is alert.     GCS: GCS eye subscore is 4. GCS verbal subscore is 5. GCS motor subscore is 6.     (all labs ordered are listed, but only abnormal results are displayed) Labs Reviewed  COMPREHENSIVE METABOLIC PANEL WITH GFR - Abnormal; Notable for the following components:      Result Value   Sodium 117 (*)    Chloride 86 (*)    CO2 15 (*)    Glucose, Bld 120 (*)    BUN 34 (*)    Total Protein 6.3 (*)    AST 56 (*)    GFR, Estimated 60 (*)    Anion gap 16 (*)    All other components within normal limits  PRO BRAIN NATRIURETIC PEPTIDE - Abnormal; Notable for the following components:   Pro Brain Natriuretic Peptide 4,537.0 (*)    All other  components within normal limits  CBC WITH DIFFERENTIAL/PLATELET - Abnormal; Notable for the following components:   RBC 2.41 (*)    Hemoglobin 8.3 (*)    HCT 24.0 (*)    MCH 34.4 (*)    All other components within normal limits  TROPONIN T, HIGH SENSITIVITY - Abnormal; Notable for the following components:   Troponin T High Sensitivity 160 (*)    All other components within normal limits  RESP PANEL BY RT-PCR (RSV, FLU A&B, COVID)  RVPGX2  PROTIME-INR  URINALYSIS, ROUTINE  W REFLEX MICROSCOPIC  BASIC METABOLIC PANEL WITH GFR  BASIC METABOLIC PANEL WITH GFR  CBC WITH DIFFERENTIAL/PLATELET  POC OCCULT BLOOD, ED  TYPE AND SCREEN    EKG: None  Radiology: [facility] Chest Port 1 View Result Date: 07/24/2024 CLINICAL DATA:  Shortness of breath. EXAM: PORTABLE CHEST 1 VIEW COMPARISON:  09/26/2022 FINDINGS: Interstitial and patchy airspace disease is seen in both lung bases with small bilateral pleural effusions. Cardiopericardial silhouette is at upper limits of normal for size. Single lead left-sided AICD evident. Telemetry leads overlie the chest. IMPRESSION: Interstitial and patchy airspace disease in both lung bases with small bilateral pleural effusions. Imaging features suggest dependent pulmonary edema although pneumonia not excluded. Electronically Signed   By: Camellia Candle M.D.   On: 07/24/2024 10:59     .Critical Care  Performed by: Towana Ozell BROCKS, MD Authorized by: Towana Ozell BROCKS, MD   Critical care provider statement:    Critical care time (minutes):  45   Critical care time was exclusive of:  Separately billable procedures and treating other patients   Critical care was necessary to treat or prevent imminent or life-threatening deterioration of the following conditions:  Respiratory failure and metabolic crisis   Critical care was time spent personally by me on the following activities:  Development of treatment plan with patient or surrogate, discussions with consultants,  evaluation of patient's response to treatment, examination of patient, obtaining history from patient or surrogate, ordering and performing treatments and interventions, ordering and review of laboratory studies, ordering and review of radiographic studies, pulse oximetry, re-evaluation of patient's condition and review of old charts   I assumed direction of critical care for this patient from another provider in my specialty: no      Medications Ordered in the ED  sodium chloride  flush (NS) 0.9 % injection 3 mL (has no administration in time range)  sodium chloride  flush (NS) 0.9 % injection 3 mL (has no administration in time range)  0.9 %  sodium chloride  infusion (has no administration in time range)  acetaminophen  (TYLENOL ) tablet 650 mg (has no administration in time range)  ondansetron  (ZOFRAN ) injection 4 mg (has no administration in time range)  enoxaparin  (LOVENOX ) injection 40 mg (has no administration in time range)  morphine  (PF) 4 MG/ML injection 4 mg (4 mg Intravenous Given 07/24/24 1407)  ipratropium-albuterol  (DUONEB) 0.5-2.5 (3) MG/3ML nebulizer solution 3 mL (3 mLs Nebulization Given 07/24/24 1649)  furosemide  (LASIX ) injection 40 mg (40 mg Intravenous Given 07/24/24 1649)    Clinical Course as of 07/24/24 1715  Wed Jul 24, 2024  1033 Patient does not normally require oxygen and is on 4 L to keep her above 90%. [MB]  1052 Chest x-ray interpreted by me as possible small effusions, possible infiltrate lower lobes.  Awaiting radiology reading. [MB]  1648 ECG did not cross in epic.  Sinus rhythm with minimal ST elevations anteriorly and ST depressions anterolaterally.  Not significantly changed from yesterday. [MB]  1713 Dr. Serene (vascular surgery) is aware of patient here today.  Recommends admission for medical optimization prior to upcoming procedure.  Nothing to do for vascular now.  No need for transfusion of hemoglobin 8.3.  Blood pressure remained stable.  Respiratory  status reassessed.  Patient now on 5 L nasal cannula.  X-ray looks like pulmonary edema will provide her with IV Lasix  here.  Scant wheezing will also trial DuoNeb treatment to see if that helps with her breathing.  Discussed with Dr. Claudene (hospitalist) who  accepts patient for admission [MP]    Clinical Course User Index [MB] Matelyn Antonelli C, MD [MP] Pamella Ozell LABOR, DO                                 Medical Decision Making Amount and/or Complexity of Data Reviewed Labs: ordered. Radiology: ordered.  Risk Prescription drug management. Decision regarding hospitalization.   This patient complains of shortness of breath leg pain abnormal labs; this involves an extensive number of treatment Options and is a complaint that carries with it a high risk of complications and morbidity. The differential includes metabolic derangement, anemia, ischemic limb, pneumonia, heart failure  I ordered, reviewed and interpreted labs, which included CBC but low but not critical hemoglobin, chemistries with critically low sodium, troponins mildly elevated, BNP elevated, COVID and flu negative I ordered medication IV pain medicine and reviewed PMP when indicated. I ordered imaging studies which included chest x-ray and I independently    visualized and interpreted imaging which showed probable edema versus infection Additional history obtained from patient's husband Previous records obtained and reviewed in epic including recent cardiology and vascular notes Cardiac monitoring reviewed, sinus rhythm Social determinants considered, no significant barriers Critical Interventions: Initiation of oxygen for patient's hypoxia and further workup of her hypoxia  After the interventions stated above, I reevaluated the patient and found patient still to be dyspneic and has a new oxygen requirement Admission and further testing considered, patient will need admission to the hospital for further management.  Have  reached out to vascular.  Patient's care signed out to Dr. Pamella to follow-up and results of labs and discussed with admitting team.      Final diagnoses:  Acute on chronic congestive heart failure, unspecified heart failure type (HCC)  Hyponatremia  Peripheral vascular disease  Acute respiratory failure with hypoxia (HCC)  Acute congestive heart failure, unspecified heart failure type [facility])    ED Discharge Orders     None          Towana Ozell BROCKS, MD 07/24/24 1718  "

## 2024-07-24 NOTE — ED Notes (Signed)
 Pt was brought to tx area via w/c-Mani, PA C reviewed chart-pt and husband were notified of need to take pt to ED

## 2024-07-24 NOTE — TOC CM/SW Note (Signed)
 TOC consult received for d/c planning needs. Follow-up to be completed with patient as appropriate.   Merilee Batty, MSN, RN Case Management [phone]

## 2024-07-24 NOTE — ED Triage Notes (Signed)
 Patient presents to ed via POV c/o sob and pain in both legs bilateral pedal edema, states she is currently seeing a doctor for the swelling Patient had sats in the 70's at triage , placed on 3 liters [place] increased sat to 94% patient is alert oriented . States she has a scheduled appointment about her legs this week

## 2024-07-24 NOTE — H&P (Addendum)
 " History and Physical    Patient: Pam [name] FMW:[id number] DOB: [birth year] DOA: 07/24/2024 DOS: the patient was seen and examined on 07/24/2024 PCP: Andrew Truman GRADE., MD  Patient coming from: Urgent care  Chief Complaint:  Chief Complaint  Patient presents with   Shortness of Breath   HPI: Pam [name] is a 73 y.o. female with medical history significant of heart failure with reduced ejection fraction last EF noted to be 35 to 40% with grade 1 diastolic dysfunction, ischemic cardiomyopathy  s/p ICD placement, CAD, hyperlipidemia, peripheral artery disease, and prior tobacco abuse who presents with leg pain and abnormal lab results in preparation for bypass surgery with Dr. Silver scheduled for 12/29. She is accompanied by her husband.  She is scheduled for bypass surgery on Monday to improve blood flow in her legs. Pre-operative testing revealed abnormal hemoglobin and potassium levels, prompting her referral to the hospital.  Labs significant for hemoglobin of 9.5 down from 13.6 previously, sodium 121, and potassium 5.3 for which she was advised to come to the hospital for possible optimization.  She experiences severe pain in both legs, particularly the right leg, significantly impacting her mobility. The pain is so severe that she is unable to stand or walk without assistance, requiring a wheelchair for mobility during her pre-operative tests. She also notes increased swelling in her legs and feet.  She has shortness of breath, which she attributes to the severity of her leg pain. Upon arrival at the hospital, she was placed on supplemental oxygen and breathing treatments.    At home, she has been using Tylenol  intermittently.  She has a history of smoking but quit in 2017. No recent changes in weight or chest pain. She reports needing to sit up or prop herself up at night due to discomfort, which has been ongoing for some time.  In the emergency department patient was noted to  be afebrile with pulse 18-27, blood pressures 102/56-132/63, O2 saturation 77% on room air with improvement on 5 L nasal cannula oxygen.  Labs significant for proBNP 4537, sodium 117, CO2 15, BUN 34, creatinine 0.99, anion gap 16, AST 56, WBC 10.9, and hemoglobin 9.  Chest x-ray noted interstitial and patchy airspace disease in both lungs with small bilateral effusions to suggest pulmonary edema.  Influenza, COVID-19, and RSV screening were negative.  Review of Systems: As mentioned in the history of present illness. All other systems reviewed and are negative. Past Medical History:  Diagnosis Date   Abnormal EKG    AICD (automatic cardioverter/defibrillator) present    [employer]   Alcoholic intoxication without complication    Allergy    Cardiomyopathy, ischemic    Cataract    Chest pain 08/29/2016   Chest pain 08/29/2016   CHF (congestive heart failure) (HCC)    Chronic systolic heart failure (HCC) 08/29/2016   Coronary artery disease    Facial laceration    Fall 07/07/2016   Hyperlipidemia    Hypertension 03/03/2017   Hyponatremia    Hypothyroidism 07/08/2016   NSTEMI (non-ST elevated myocardial infarction) (HCC)    Syncope    Thyroid  disease    Past Surgical History:  Procedure Laterality Date   CARDIAC CATHETERIZATION N/A 07/08/2016   Procedure: Left Heart Cath and Coronary Angiography;  Surgeon: Lonni Hanson, MD;  Location: [facility] INVASIVE CV LAB;  Service: Cardiovascular;  Laterality: N/A;   CARDIAC CATHETERIZATION N/A 08/29/2016   Procedure: Coronary Atherectomy;  Surgeon: Lonni Hanson, MD;  Location: [facility] INVASIVE CV LAB;  Service:  Cardiovascular;  Laterality: N/A;   CARDIAC CATHETERIZATION N/A 08/29/2016   Procedure: Coronary Stent Intervention;  Surgeon: Lonni Hanson, MD;  Location: [facility] INVASIVE CV LAB;  Service: Cardiovascular;  Laterality: N/A;   CATARACT EXTRACTION, BILATERAL     COLONOSCOPY WITH PROPOFOL  N/A 11/18/2021   Procedure: COLONOSCOPY WITH PROPOFOL ;   Surgeon: Eda Iha, MD;  Location: [facility] ENDOSCOPY;  Service: Gastroenterology;  Laterality: N/A;   CORONARY ANGIOPLASTY     coronary stents      HAND SURGERY Right    Dr Alyse Gaba   ICD IMPLANT N/A 08/28/2019   Procedure: ICD IMPLANT;  Surgeon: Inocencio Soyla Lunger, MD;  Location: [facility] INVASIVE CV LAB;  Service: Cardiovascular;  Laterality: N/A;   LEFT HEART CATH AND CORONARY ANGIOGRAPHY N/A 04/29/2022   Procedure: LEFT HEART CATH AND CORONARY ANGIOGRAPHY;  Surgeon: Anner Alm ORN, MD;  Location: [facility] INVASIVE CV LAB;  Service: Cardiovascular;  Laterality: N/A;   LOWER EXTREMITY ANGIOGRAPHY N/A 06/19/2024   Procedure: Lower Extremity Angiography;  Surgeon: Darron Deatrice LABOR, MD;  Location: [facility] INVASIVE CV LAB;  Service: Cardiovascular;  Laterality: N/A;   PARATHYROIDECTOMY Right 02/02/2021   Procedure: RIGHT SUPERIOR PARATHYROIDECTOMY;  Surgeon: Eletha Boas, MD;  Location: [facility] ORS;  Service: General;  Laterality: Right;   POLYPECTOMY  11/18/2021   Procedure: POLYPECTOMY;  Surgeon: Eda Iha, MD;  Location: [facility] ENDOSCOPY;  Service: Gastroenterology;;   RIGHT HEART CATH N/A 08/11/2022   Procedure: RIGHT HEART CATH;  Surgeon: Gardenia Led, DO;  Location: [facility] INVASIVE CV LAB;  Service: Cardiovascular;  Laterality: N/A;   VAGINAL HYSTERECTOMY     Social History:  reports that she quit smoking about 8 years ago. Her smoking use included cigarettes. She started smoking about 58 years ago. She has a 50 pack-year smoking history. She has never used smokeless tobacco. She reports that she does not currently use alcohol. She reports that she does not use drugs.  Allergies[1]  Family History  Problem Relation Age of Onset   Kidney failure Mother    Heart disease Mother        CHF   Heart disease Sister    Colon cancer Neg Hx    Esophageal cancer Neg Hx    Rectal cancer Neg Hx    Stomach cancer Neg Hx     Prior to Admission medications  Medication Sig Start Date End Date  Taking? Authorizing Provider  acetaminophen  (TYLENOL ) 500 MG tablet Take 1,000 mg by mouth every 6 (six) hours as needed for moderate pain.    [provider]  albuterol  (VENTOLIN  HFA) 108 (90 Base) MCG/ACT inhaler Inhale 2 puffs into the lungs every 6 (six) hours as needed for wheezing or shortness of breath. 12/28/23   Hope Almarie ORN, NP  aspirin  81 MG chewable tablet Chew 81 mg by mouth daily.    [provider]  atorvastatin  (LIPITOR ) 80 MG tablet TAKE 1 TABLET(80 MG) BY MOUTH DAILY 11/27/18   Verlin Lonni BIRCH, MD  bisoprolol  (ZEBETA ) 5 MG tablet Take 0.5 tablets (2.5 mg total) by mouth daily. 06/26/24   Glena Harlene HERO, FNP  CALCIUM  PO Take 1 tablet by mouth daily.    [provider]  Cholecalciferol (VITAMIN D3) 50 MCG (2000 UT) TABS Take 2,000 Units by mouth daily.    [provider]  clopidogrel  (PLAVIX ) 75 MG tablet TAKE 1 TABLET(75 MG) BY MOUTH DAILY WITH BREAKFAST 11/27/23   Sabharwal, Aditya, DO  digoxin  (LANOXIN ) 0.125 MG tablet Take 0.5 tablets (0.0625 mg total)  by mouth daily. 11/22/23   Glena Harlene HERO, FNP  furosemide  (LASIX ) 20 MG tablet TAKE 1 TABLET BY MOUTH AS NEEDED 02/27/24   Sabharwal, Aditya, DO  levothyroxine  (SYNTHROID ) 25 MCG tablet Take 37.5 mcg by mouth daily before breakfast. 07/10/23   Thapa, Sudan, MD  nitroGLYCERIN  (NITROSTAT ) 0.4 MG SL tablet Place 1 tablet (0.4 mg total) under the tongue every 5 (five) minutes x 3 doses as needed for chest pain. 06/14/24   Verlin Lonni BIRCH, MD  nystatin cream (MYCOSTATIN) Apply 1 Application topically 2 (two) times daily as needed (rash). 05/09/22   [provider]  ranolazine  (RANEXA ) 1000 MG SR tablet TAKE 1 TABLET(1000 MG) BY MOUTH TWICE DAILY 02/09/24   Milford, Harlene HERO, FNP  sacubitril -valsartan  (ENTRESTO ) 49-51 MG Take 1 tablet by mouth 2 (two) times daily. 05/01/24   Clegg, Amy D, NP  spironolactone  (ALDACTONE ) 25 MG tablet TAKE 1 TABLET(25 MG) BY MOUTH DAILY  07/03/23   Sabharwal, Aditya, DO  umeclidinium-vilanterol (ANORO ELLIPTA ) 62.5-25 MCG/ACT AEPB Inhale 1 puff into the lungs daily. 12/28/23   Hope Almarie ORN, NP  valACYclovir  (VALTREX ) 1000 MG tablet Take 1,000 mg by mouth daily.    [provider]    Physical Exam: Vitals:   07/24/24 1600 07/24/24 1615 07/24/24 1630 07/24/24 1645  BP: (!) 114/45 (!) 119/49 (!) 115/50 122/65  Pulse: 87 83 87 88  Resp: 18 19 20  (!) 23  Temp:      TempSrc:      SpO2: 94% 93% 90% 91%  Weight:      Height:        Constitutional: Elderly female, NAD, calm, comfortable Eyes: PERRL, lids and conjunctivae normal ENMT: Mucous membranes are moist.   Normal dentition.  Neck: normal, supple, JVD present Respiratory: Normal respiratory effort with crackles heard in the mid to lower lung fields bilaterally. Cardiovascular: Regular rate and rhythm, no murmurs / rubs / gallops.  Bilateral lower extremity edema present    Abdomen: no tenderness, no masses palpated. No hepatosplenomegaly. Bowel sounds positive.  Musculoskeletal: no clubbing / cyanosis. No joint deformity upper and lower extremities. Good ROM, no contractures. Normal muscle tone.  Skin: no rashes, lesions, ulcers. No induration Neurologic: CN 2-12 grossly intact. Sensation intact, DTR normal. Strength 5/5 in all 4.  Psychiatric: Normal judgment and insight. Alert and oriented x 3. Normal mood.   Data Reviewed:  EKG reveals sinus rhythm at 77 bpm with right axis deviation.  Reviewed labs, imaging, and pertinent records as documented.  Assessment and Plan:  Acute respiratory failure with hypoxia secondary to heart failure with reduced ejection fraction Acute on chronic.  Patient presented with complaints of increased lower extremity swelling along with pain and orthopnea.  O2 saturations noted to be as low as 77% with improvement on 5 L nasal cannula oxygen.  Normally patient not on oxygen at baseline.  On physical exam patient with JVD  present and crackles on lung exam.  proBNP elevated 4537.  Chest x-ray given concern for pulmonary edema with bilateral pleural effusions.  Patient was given Lasix  40 mg IV.  Last echocardiogram noted EF to be 35 to 40% with grade 1 diastolic dysfunction when checked 07/05/2024. - Admit to a progressive bed - Heart failure order set utilized - Strict I&O's and daily weight - Lasix  40 mg IV twice daily - Continue goal-directed medical therapy as tolerated in a.m.  Elevated troponin Acute.  High-sensitivity troponins 160.  Thought secondary to demand in setting of CHF  exacerbation. - Follow-up repeat troponin  Hyponatremia Acute on chronic.  Sodium noted to be acutely low at 117.  Thought secondary to patient being acutely fluid overloaded. - Serial monitoring with diuresis  Critical limb ischemia Patient reports chronic pain in the right lower extremity for which she is scheduled for bypass grafting on 12/29 with Dr. Silver.  Vascular surgery had been notified of the patient admission. - Continue Plavix  and statin  COPD Patient without significant wheezing appreciated on physical exam at this time. - Continue home inhalers - Albuterol  breathing treatments as needed for shortness of breath/wheezing  Essential hypertension Blood pressures currently mains soft. - Resume home blood pressure regimen as tolerated in a.m.  Hypothyroidism - Check TSH  - Continue levothyroxine   DVT prophylaxis: Lovenox  Advance Care Planning:   Code Status: Full Code    Consults: Vascular Surgery  Family Communication: Husband updated at bedside  Severity of Illness: The appropriate patient status for this patient is INPATIENT. Inpatient status is judged to be reasonable and necessary in order to provide the required intensity of service to ensure the patient's safety. The patient's presenting symptoms, physical exam findings, and initial radiographic and laboratory data in the context of their chronic  comorbidities is felt to place them at high risk for further clinical deterioration. Furthermore, it is not anticipated that the patient will be medically stable for discharge from the hospital within 2 midnights of admission.   * I certify that at the point of admission it is my clinical judgment that the patient will require inpatient hospital care spanning beyond 2 midnights from the point of admission due to high intensity of service, high risk for further deterioration and high frequency of surveillance required.*  Author: Maximino DELENA Sharps, MD 07/24/2024 5:02 PM  For on call review [email or web address].      [1]  Allergies Allergen Reactions   Lisinopril  Cough    Dry cough   "

## 2024-07-24 NOTE — ED Notes (Signed)
 CCMD called.

## 2024-07-24 NOTE — ED Provider Notes (Signed)
 Patient evaluated through triage briefly.  She is very symptomatic including generalized weakness, swelling.  Was seen recently by her vascular surgeon.  They did recent labs preop and advised that she present to the emergency room given significant changes in her labs including her electrolytes, hyponatremia, increased BUN, dramatic drop in her hemoglobin and hematocrit.  I reviewed this with the patient and her husband, will present to the emergency room now.   Christopher Savannah, [place] 07/24/24 [phone]

## 2024-07-24 NOTE — Telephone Encounter (Signed)
 This nurse was notified of patient's pre-op labs.  Pt scheduled for surgery 07/29/24- FYI Her labs have really changed in the past 6 weeks. Please contact Dr. Francina or let his nurse know that HGB dropped from 13.6 to 9.5 in past 5 weeks and sodium is down from from 127-132 to 121, K 5.3.  Per Dr Pearline, if patient is symptomatic, she will need to go to the emergency room.  Spoke to patient who agreed to go to urgent care close to her home to be evaluated.

## 2024-07-24 NOTE — ED Provider Notes (Signed)
" °  Physical Exam  BP 132/63   Pulse 95   Temp 98.8 F (37.1 C)   Resp (!) 27   Ht 5' 3 (1.6 m)   Wt 52.2 kg   SpO2 94%   BMI 20.37 kg/m   Physical Exam Vitals and nursing note reviewed.  HENT:     Head: Normocephalic and atraumatic.  Eyes:     Pupils: Pupils are equal, round, and reactive to light.  Cardiovascular:     Rate and Rhythm: Normal rate and regular rhythm.  Pulmonary:     Effort: Pulmonary effort is normal.     Breath sounds: Normal breath sounds.  Abdominal:     Palpations: Abdomen is soft.     Tenderness: There is no abdominal tenderness.  Skin:    General: Skin is warm and dry.  Neurological:     Mental Status: She is alert.  Psychiatric:        Mood and Affect: Mood normal.     Procedures  Procedures  ED Course / MDM   Clinical Course as of 07/24/24 1714  Wed Jul 24, 2024  1033 Patient does not normally require oxygen and is on 4 L to keep her above 90%. [MB]  1052 Chest x-ray interpreted by me as possible small effusions, possible infiltrate lower lobes.  Awaiting radiology reading. [MB]  1648 ECG did not cross in epic.  Sinus rhythm with minimal ST elevations anteriorly and ST depressions anterolaterally.  Not significantly changed from yesterday. [MB]  1713 Dr. Serene (vascular surgery) is aware of patient here today.  Recommends admission for medical optimization prior to upcoming procedure.  Nothing to do for vascular now.  No need for transfusion of hemoglobin 8.3.  Blood pressure remained stable.  Respiratory status reassessed.  Patient now on 5 L nasal cannula.  X-ray looks like pulmonary edema will provide her with IV Lasix  here.  Scant wheezing will also trial DuoNeb treatment to see if that helps with her breathing.  Discussed with Dr. Claudene (hospitalist) who accepts patient for admission [MP]    Clinical Course User Index [MB] Pam Ozell BROCKS, MD [MP] Pam Ozell LABOR, DO   Medical Decision Making I, Ozell Pamella DO, have assumed care  of this patient from the previous provider pending discussion with vascular surgery remainder of laboratory workup and admission  Amount and/or Complexity of Data Reviewed Labs: ordered. Radiology: ordered.  Risk Prescription drug management.          Pam Ozell LABOR, DO 07/24/24 1714  "

## 2024-07-25 DIAGNOSIS — I5020 Unspecified systolic (congestive) heart failure: Secondary | ICD-10-CM | POA: Diagnosis not present

## 2024-07-25 LAB — CBC WITH DIFFERENTIAL/PLATELET
Abs Immature Granulocytes: 0.07 K/uL (ref 0.00–0.07)
Basophils Absolute: 0 K/uL (ref 0.0–0.1)
Basophils Relative: 0 %
Eosinophils Absolute: 0 K/uL (ref 0.0–0.5)
Eosinophils Relative: 0 %
HCT: 23 % — ABNORMAL LOW (ref 36.0–46.0)
Hemoglobin: 8.1 g/dL — ABNORMAL LOW (ref 12.0–15.0)
Immature Granulocytes: 1 %
Lymphocytes Relative: 11 %
Lymphs Abs: 1.4 K/uL (ref 0.7–4.0)
MCH: 34.3 pg — ABNORMAL HIGH (ref 26.0–34.0)
MCHC: 35.2 g/dL (ref 30.0–36.0)
MCV: 97.5 fL (ref 80.0–100.0)
Monocytes Absolute: 1.2 K/uL — ABNORMAL HIGH (ref 0.1–1.0)
Monocytes Relative: 10 %
Neutro Abs: 10 K/uL — ABNORMAL HIGH (ref 1.7–7.7)
Neutrophils Relative %: 78 %
Platelets: 395 K/uL (ref 150–400)
RBC: 2.36 MIL/uL — ABNORMAL LOW (ref 3.87–5.11)
RDW: 14.1 % (ref 11.5–15.5)
WBC: 12.8 K/uL — ABNORMAL HIGH (ref 4.0–10.5)
nRBC: 0 % (ref 0.0–0.2)

## 2024-07-25 LAB — BASIC METABOLIC PANEL WITH GFR
Anion gap: 10 (ref 5–15)
BUN: 33 mg/dL — ABNORMAL HIGH (ref 8–23)
CO2: 23 mmol/L (ref 22–32)
Calcium: 9 mg/dL (ref 8.9–10.3)
Chloride: 88 mmol/L — ABNORMAL LOW (ref 98–111)
Creatinine, Ser: 0.98 mg/dL (ref 0.44–1.00)
GFR, Estimated: >60 mL/min
Glucose, Bld: 117 mg/dL — ABNORMAL HIGH (ref 70–99)
Potassium: 4.2 mmol/L (ref 3.5–5.1)
Sodium: 121 mmol/L — ABNORMAL LOW (ref 135–145)

## 2024-07-25 LAB — GLUCOSE, CAPILLARY: Glucose-Capillary: 95 mg/dL (ref 70–99)

## 2024-07-25 MED ORDER — FUROSEMIDE 10 MG/ML IJ SOLN
40.0000 mg | Freq: Three times a day (TID) | INTRAMUSCULAR | Status: DC
  Administered 2024-07-25: 40 mg via INTRAVENOUS
  Filled 2024-07-25: qty 4

## 2024-07-25 MED ORDER — CALCIUM CARBONATE ANTACID 500 MG PO CHEW: 400.0000 mg | CHEWABLE_TABLET | Freq: Once | ORAL | Status: DC | PRN

## 2024-07-25 MED ORDER — FUROSEMIDE 10 MG/ML IJ SOLN
40.0000 mg | Freq: Two times a day (BID) | INTRAMUSCULAR | Status: DC
  Administered 2024-07-26 – 2024-07-27 (×4): 40 mg via INTRAVENOUS
  Filled 2024-07-25 (×5): qty 4

## 2024-07-25 NOTE — Progress Notes (Signed)
 " PROGRESS NOTE    Pam [name]  FMW:[id number] DOB: January 25, 1951 DOA: 07/24/2024 PCP: Andrew Truman GRADE., MD   Brief Narrative:  Pam [name] is a 73 y.o. female with medical history significant of HFrEF - EF 35-40% with grade 1 diastolic dysfunction, ischemic cardiomyopathy s/p ICD placement, CAD, hyperlipidemia, peripheral artery disease, and prior tobacco abuse who presents with leg pain and abnormal lab results in preparation for bypass surgery with Dr. Silver scheduled for lower extremity bypass surgery on 12/29.  Patient noted to be markedly hypoxic in intake concerning for heart failure exacerbation, diuretics initiated at intake with notable hyperkalemia at 5.3.  Assessment & Plan:   Principal Problem:   Heart failure with reduced ejection fraction (HCC) Active Problems:   Acute respiratory failure with hypoxia (HCC)   Elevated troponin   Hyponatremia   Critical limb ischemia of right lower extremity (HCC)   COPD (chronic obstructive pulmonary disease) (HCC)   Essential hypertension   Hypothyroidism   Pressure injury of skin  Acute respiratory failure with hypoxia secondary to heart failure with reduced ejection fraction exacerbation, POA Bilateral pleural effusion -Patient does not require oxygen at baseline, currently on 4 L nasal cannula after being weaned down this morning from 7 L - Respiratory status appears to be improving - Continue diuresis as tolerated- Hold furosemide  until am given hypotension - Recent echo earlier this month, no indication to repeat at this time - BNP markedly elevated at 4500   Elevated troponin -Likely multifactorial, ACS ruled out -Minimally elevated likely due to hypoxia and concurrent heart failure exacerbation - EKG and lack of symptoms reassuring   Hyponatremia, hypervolemic Acute on chronic, trough 117 at intake, sodium now approaching baseline high 120s/low 130s   Critical limb ischemia -Bypass graft scheduled 12/29 Dr.  Silver, vascular surgery is aware of the patient's admission -No indication for consult at this time, unclear if patient's heart failure exacerbation will delay or interfere with patient's scheduled procedure  - Plavix  currently on hold per vascular's previous recommendations- continue statin   COPD, without acute exacerbation -Does not appear to be contributing to patient's hypoxia, no wheeze noted -Continue home inhaled medications, no signs or symptoms of infection at this time   Essential hypertension Currently hypotensive - Discontinue continue bisoprolol  and spironolactone  given borderline hypotension  Hypothyroidism - TSH elevated, recommend repeat outpatient once acute illness is resolved - Continue levothyroxine   DVT prophylaxis: enoxaparin  (LOVENOX ) injection 40 mg Start: 07/24/24 2200 Code Status:   Code Status: Full Code Family Communication: At bedside  Status is: Inpatient  Dispo: The patient is from: Home              Anticipated d/c is to: Home              Anticipated d/c date is: To be determined              Patient currently not medically stable for discharge  Consultants:  Vascular surgery  Procedures:  None currently planned, previously scheduled vascular intervention on 12/29  Antimicrobials:  None indicated  Subjective: No acute issues or events overnight denies nausea vomiting diarrhea constipation headache fevers chills or chest pain.  Shortness of breath appears to be improving drastically but not yet back to baseline.  Patient has not ambulated and cannot comment on exertional dyspnea at this time  Objective: Vitals:   07/25/24 0000 07/25/24 0330 07/25/24 0400 07/25/24 0500  BP: (!) 116/51 (!) 97/38 (!) 106/43   Pulse:  78 77  Resp:  15 15   Temp:  98.4 F (36.9 C)    TempSrc:  Oral    SpO2:  98% 92%   Weight:    53.2 kg  Height:       No intake or output data in the 24 hours ending 07/25/24 0718 Filed Weights   07/24/24 1024  07/24/24 1814 07/25/24 0500  Weight: 52.2 kg 53.1 kg 53.2 kg    Examination:  General:  Pleasantly resting in bed, No acute distress. HEENT:  Normocephalic atraumatic.  Sclerae nonicteric, noninjected.  Extraocular movements intact bilaterally. Neck:  Without mass or deformity.  Trachea is midline. Lungs: Bibasilar rales. Heart:  Regular rate and rhythm.  Without murmurs, rubs, or gallops. Abdomen:  Soft, nontender, nondistended.  Without guarding or rebound. Extremities: Without cyanosis, clubbing, edema, or obvious deformity. Skin:  Warm and dry, no erythema.  Data Reviewed: I have personally reviewed following labs and imaging studies  CBC: Recent Labs  Lab 07/23/24 0915 07/24/24 1016 07/24/24 1540 07/25/24 0253  WBC 11.4* 10.9* 8.2 12.8*  NEUTROABS  --   --  5.9 10.0*  HGB 9.5* 9.0* 8.3* 8.1*  HCT 27.4* 26.2* 24.0* 23.0*  MCV 98.9 99.2 99.6 97.5  PLT 368 390 379 395   Basic Metabolic Panel: Recent Labs  Lab 07/23/24 0915 07/24/24 1016 07/24/24 1843 07/25/24 0253  NA 121* 117* 119* 121*  K 5.3* 5.1 4.4 4.2  CL 89* 86* 88* 88*  CO2 23 15* 17* 23  GLUCOSE 103* 120* 130* 117*  BUN 28* 34* 32* 33*  CREATININE 0.88 0.99 0.97 0.98  CALCIUM  9.4 10.1 9.5 9.0   GFR: Estimated Creatinine Clearance: 42.3 mL/min (by C-G formula based on SCr of 0.98 mg/dL). Liver Function Tests: Recent Labs  Lab 07/23/24 0915 07/24/24 1016  AST 28 56*  ALT 16 23  ALKPHOS 37* 39  BILITOT 0.4 0.4  PROT 6.4* 6.3*  ALBUMIN 4.2 4.1   No results for input(s): LIPASE, AMYLASE in the last 168 hours. No results for input(s): AMMONIA in the last 168 hours. Coagulation Profile: Recent Labs  Lab 07/23/24 0915 07/24/24 1016  INR 1.0 1.1   Cardiac Enzymes: No results for input(s): CKTOTAL, CKMB, CKMBINDEX, TROPONINI in the last 168 hours. BNP (last 3 results) Recent Labs    07/24/24 1016  PROBNP 4,537.0*   HbA1C: No results for input(s): HGBA1C in the last 72  hours. CBG: No results for input(s): GLUCAP in the last 168 hours. Lipid Profile: No results for input(s): CHOL, HDL, LDLCALC, TRIG, CHOLHDL, LDLDIRECT in the last 72 hours. Thyroid  Function Tests: Recent Labs    07/24/24 1016  TSH 6.890*   Anemia Panel: No results for input(s): VITAMINB12, FOLATE, FERRITIN, TIBC, IRON, RETICCTPCT in the last 72 hours. Sepsis Labs: Recent Labs  Lab 07/24/24 1843  PROCALCITON <0.10    Recent Results (from the past 240 hours)  Surgical pcr screen     Status: None   Collection Time: 07/23/24  9:15 AM   Specimen: Nasal Mucosa; Nasal Swab  Result Value Ref Range Status   MRSA, PCR NEGATIVE NEGATIVE Final   Staphylococcus aureus NEGATIVE NEGATIVE Final    Comment: (NOTE) The Xpert SA Assay (FDA approved for NASAL specimens in patients 73 years of age and older), is one component of a comprehensive surveillance program. It is not intended to diagnose infection nor to guide or monitor treatment. Performed at [facility], [address] [place], [place] [address]   Resp panel  by RT-PCR (RSV, Flu A&B, Covid) Anterior Nasal Swab     Status: None   Collection Time: 07/24/24 10:33 AM   Specimen: Anterior Nasal Swab  Result Value Ref Range Status   SARS Coronavirus 2 by RT PCR NEGATIVE NEGATIVE Final   Influenza A by PCR NEGATIVE NEGATIVE Final   Influenza B by PCR NEGATIVE NEGATIVE Final    Comment: (NOTE) The Xpert Xpress SARS-CoV-2/FLU/RSV plus assay is intended as an aid in the diagnosis of influenza from Nasopharyngeal swab specimens and should not be used as a sole basis for treatment. Nasal washings and aspirates are unacceptable for Xpert Xpress SARS-CoV-2/FLU/RSV testing.  Fact Sheet for Patients: bloggercourse.com  Fact Sheet for Healthcare Providers: seriousbroker.it  This test is not yet approved or cleared by the [place]  FDA and has been  authorized for detection and/or diagnosis of SARS-CoV-2 by FDA under an Emergency Use Authorization (EUA). This EUA will remain in effect (meaning this test can be used) for the duration of the COVID-19 declaration under Section 564(b)(1) of the Act, 21 U.S.C. section 360bbb-3(b)(1), unless the authorization is terminated or revoked.     Resp Syncytial Virus by PCR NEGATIVE NEGATIVE Final    Comment: (NOTE) Fact Sheet for Patients: bloggercourse.com  Fact Sheet for Healthcare Providers: seriousbroker.it  This test is not yet approved or cleared by the [place]  FDA and has been authorized for detection and/or diagnosis of SARS-CoV-2 by FDA under an Emergency Use Authorization (EUA). This EUA will remain in effect (meaning this test can be used) for the duration of the COVID-19 declaration under Section 564(b)(1) of the Act, 21 U.S.C. section 360bbb-3(b)(1), unless the authorization is terminated or revoked.  Performed at [facility], [address], [place], [place] [address]          Radiology Studies: DG Chest Port 1 View Result Date: 07/24/2024 CLINICAL DATA:  Shortness of breath. EXAM: PORTABLE CHEST 1 VIEW COMPARISON:  09/26/2022 FINDINGS: Interstitial and patchy airspace disease is seen in both lung bases with small bilateral pleural effusions. Cardiopericardial silhouette is at upper limits of normal for size. Single lead left-sided AICD evident. Telemetry leads overlie the chest. IMPRESSION: Interstitial and patchy airspace disease in both lung bases with small bilateral pleural effusions. Imaging features suggest dependent pulmonary edema although pneumonia not excluded. Electronically Signed   By: Camellia Candle M.D.   On: 07/24/2024 10:59        Scheduled Meds:  atorvastatin   80 mg Oral Daily   bisoprolol   2.5 mg Oral Daily   clopidogrel   75 mg Oral Daily   digoxin   0.0625 mg Oral Daily   enoxaparin   (LOVENOX ) injection  40 mg Subcutaneous Q24H   furosemide   40 mg Intravenous BID   levothyroxine   37.5 mcg Oral Q0600   ranolazine   1,000 mg Oral BID   sodium chloride  flush  3 mL Intravenous Q12H   spironolactone   25 mg Oral Daily   umeclidinium-vilanterol  1 puff Inhalation Daily   Continuous Infusions:  sodium chloride        LOS: 1 day   Time spent:  Elsie JAYSON Montclair, DO [facility]  If 7PM-7AM, please contact night-coverage [email or web address]  07/25/2024, 7:18 AM      "

## 2024-07-25 NOTE — Significant Event (Signed)
 Pt was asleep sats 82, increased 02 flow to 5L sats 93% is alert and Oriented.

## 2024-07-25 NOTE — Progress Notes (Signed)
 Pt Stated that she is not suppose to take Plavix  5 days before surgery. But its still listed on MAR. Patient did not tell me until after she took morning dose.

## 2024-07-25 NOTE — Progress Notes (Signed)
" °   07/25/24 1600  Assess: MEWS Score  Temp 97.8 F (36.6 C)  BP (!) 94/49  MAP (mmHg) (!) 63  Pulse Rate 73  ECG Heart Rate 74  Resp 13  SpO2 92 %  O2 Device Nasal Cannula  Assess: MEWS Score  MEWS Temp 0  MEWS Systolic 1  MEWS Pulse 0  MEWS RR 1  MEWS LOC 0  MEWS Score 2  MEWS Score Color Yellow  Assess: if the MEWS score is Yellow or Red  Were vital signs accurate and taken at a resting state? Yes  Does the patient meet 2 or more of the SIRS criteria? No  MEWS guidelines implemented  Yes, yellow  Treat  MEWS Interventions Considered administering scheduled or prn medications/treatments as ordered  Take Vital Signs  Increase Vital Sign Frequency  Yellow: Q2hr x1, continue Q4hrs until patient remains green for 12hrs  Escalate  MEWS: Escalate Yellow: Discuss with charge nurse and consider notifying provider and/or RRT  Notify: Charge Nurse/RN  Name of Charge Nurse/RN Notified [facility]  Provider Notification  Provider Name/Title Dr Lue  Date Provider Notified 07/25/24  Time Provider Notified 1640  Method of Notification Page  Notification Reason Change in status  Provider response Evaluate remotely  Date of Provider Response 07/25/24  Time of Provider Response 1643  Notify: Rapid Response  Name of Rapid Response RN Notified n/a  Assess: SIRS CRITERIA  SIRS Temperature  0  SIRS Respirations  0  SIRS Pulse 0  SIRS WBC 0  SIRS Score Sum  0    "

## 2024-07-25 NOTE — Plan of Care (Signed)

## 2024-07-26 DIAGNOSIS — I2510 Atherosclerotic heart disease of native coronary artery without angina pectoris: Secondary | ICD-10-CM

## 2024-07-26 DIAGNOSIS — I255 Ischemic cardiomyopathy: Secondary | ICD-10-CM

## 2024-07-26 DIAGNOSIS — I509 Heart failure, unspecified: Secondary | ICD-10-CM | POA: Diagnosis not present

## 2024-07-26 DIAGNOSIS — Z95810 Presence of automatic (implantable) cardiac defibrillator: Secondary | ICD-10-CM

## 2024-07-26 DIAGNOSIS — I70291 Other atherosclerosis of native arteries of extremities, right leg: Secondary | ICD-10-CM

## 2024-07-26 DIAGNOSIS — I5020 Unspecified systolic (congestive) heart failure: Secondary | ICD-10-CM | POA: Diagnosis not present

## 2024-07-26 DIAGNOSIS — I70212 Atherosclerosis of native arteries of extremities with intermittent claudication, left leg: Secondary | ICD-10-CM

## 2024-07-26 DIAGNOSIS — E785 Hyperlipidemia, unspecified: Secondary | ICD-10-CM | POA: Diagnosis not present

## 2024-07-26 DIAGNOSIS — I70221 Atherosclerosis of native arteries of extremities with rest pain, right leg: Secondary | ICD-10-CM | POA: Diagnosis not present

## 2024-07-26 DIAGNOSIS — M7989 Other specified soft tissue disorders: Secondary | ICD-10-CM

## 2024-07-26 LAB — BASIC METABOLIC PANEL WITH GFR
Anion gap: 9 (ref 5–15)
Anion gap: 8 (ref 5–15)
BUN: 33 mg/dL — ABNORMAL HIGH (ref 8–23)
BUN: 35 mg/dL — ABNORMAL HIGH (ref 8–23)
CO2: 23 mmol/L (ref 22–32)
CO2: 24 mmol/L (ref 22–32)
Calcium: 8.6 mg/dL — ABNORMAL LOW (ref 8.9–10.3)
Calcium: 8.7 mg/dL — ABNORMAL LOW (ref 8.9–10.3)
Chloride: 87 mmol/L — ABNORMAL LOW (ref 98–111)
Chloride: 88 mmol/L — ABNORMAL LOW (ref 98–111)
Creatinine, Ser: 0.98 mg/dL (ref 0.44–1.00)
Creatinine, Ser: 1.05 mg/dL — ABNORMAL HIGH (ref 0.44–1.00)
GFR, Estimated: >60 mL/min
GFR, Estimated: 56 mL/min — ABNORMAL LOW
Glucose, Bld: 110 mg/dL — ABNORMAL HIGH (ref 70–99)
Glucose, Bld: 125 mg/dL — ABNORMAL HIGH (ref 70–99)
Potassium: 4.3 mmol/L (ref 3.5–5.1)
Potassium: 4.8 mmol/L (ref 3.5–5.1)
Sodium: 119 mmol/L — CL (ref 135–145)
Sodium: 120 mmol/L — ABNORMAL LOW (ref 135–145)

## 2024-07-26 MED ORDER — SODIUM CHLORIDE 1 G PO TABS
2.0000 g | ORAL_TABLET | Freq: Once | ORAL | Status: AC
  Administered 2024-07-26: 2 g via ORAL
  Filled 2024-07-26: qty 2

## 2024-07-26 MED ORDER — VALACYCLOVIR HCL 500 MG PO TABS
1000.0000 mg | ORAL_TABLET | Freq: Every day | ORAL | Status: DC
  Administered 2024-07-26 – 2024-08-13 (×18): 1000 mg via ORAL
  Filled 2024-07-26 (×12): qty 2

## 2024-07-26 NOTE — Evaluation (Signed)
 Occupational Therapy Evaluation Patient Details Name: Pam [name] MRN: [id number] DOB: Jul 22, 1951 Today's Date: 07/26/2024   History of Present Illness   Pt is a 73 y.o female admitted 12/24 for SOB and bil LE edema. Chest x-ray showed pulmonary edema. Pt is scheduled for bypass surgery on 12/29. PMH: CAD, CHF, PVD, ICD, HLD, PAD, NSTEMI, cataracts     Clinical Impressions Pt admitted based on above, and was seen based on problem list below. PTA pt was independent with ADLs and IADLs. Today pt is requiring set up  to CGA for ADLs. Functional transfers are  CGA with no AD. Pt limited by fatigue and decreased activity tolerance. Required x2 seated rest breaks with activity and 6L O2 to sustain >92%. Anticipate with increased activity pt will progress well, no follow up OT needs. Educated pt and spouse on benefits of shower chair at d/c to promote energy conservation with ADLs. OT will continue to follow acutely to maximize functional independence.     If plan is discharge home, recommend the following:   A little help with walking and/or transfers;A little help with bathing/dressing/bathroom;Assistance with cooking/housework     Functional Status Assessment   Patient has had a recent decline in their functional status and demonstrates the ability to make significant improvements in function in a reasonable and predictable amount of time.     Equipment Recommendations   Tub/shower seat      Precautions/Restrictions   Precautions Precautions: Fall Recall of Precautions/Restrictions: Impaired Precaution/Restrictions Comments: watch o2 Restrictions Weight Bearing Restrictions Per Provider Order: No     Mobility Bed Mobility Overal bed mobility: Needs Assistance Bed Mobility: Sit to Supine       Sit to supine: Supervision   General bed mobility comments: received sitting EOB, supervision for lines to return to bed    Transfers Overall transfer level: Needs  assistance Equipment used: None Transfers: Sit to/from Stand Sit to Stand: Contact guard assist           General transfer comment: CGA for HH assist, short distance in room      Balance Overall balance assessment: Needs assistance Sitting-balance support: No upper extremity supported, Feet supported Sitting balance-Leahy Scale: Fair     Standing balance support: No upper extremity supported, During functional activity, Reliant on assistive device for balance Standing balance-Leahy Scale: Poor Standing balance comment: CGA, would benefit from UE support       ADL either performed or assessed with clinical judgement   ADL Overall ADL's : Needs assistance/impaired Eating/Feeding: Set up;Sitting   Grooming: Wash/dry face;Contact guard assist;Standing   Upper Body Bathing: Contact guard assist;Standing   Lower Body Bathing: Contact guard assist;Sit to/from stand   Upper Body Dressing : Set up;Sitting   Lower Body Dressing: Contact guard assist;Sit to/from stand   Toilet Transfer: Contact guard assist;Ambulation;Regular Toilet;Grab bars   Toileting- Clothing Manipulation and Hygiene: Contact guard assist;Sit to/from stand       Functional mobility during ADLs: Contact guard assist General ADL Comments: Limited by decreased activity tolerance, HH assist     Vision Baseline Vision/History: 4 Cataracts Patient Visual Report: No change from baseline Vision Assessment?: No apparent visual deficits            Pertinent Vitals/Pain Pain Assessment Pain Assessment: Faces Faces Pain Scale: Hurts little more Pain Location: BLEs Pain Descriptors / Indicators: Discomfort, Grimacing Pain Intervention(s): Monitored during session     Extremity/Trunk Assessment Upper Extremity Assessment Upper Extremity Assessment: Generalized weakness   Lower Extremity  Assessment Lower Extremity Assessment: Defer to PT evaluation   Cervical / Trunk Assessment Cervical / Trunk  Assessment: Normal   Communication Communication Communication: No apparent difficulties Factors Affecting Communication: Hearing impaired   Cognition Arousal: Alert Behavior During Therapy: WFL for tasks assessed/performed Cognition: Cognition impaired           Executive functioning impairment (select all impairments): Problem solving, Reasoning OT - Cognition Comments: Potentially limited d/t HOH and fatigue. Poor problem solving with obstacles during ADLs required cues     Following commands: Intact       Cueing  General Comments   Cueing Techniques: Verbal cues  Session conducted on 6L, O2 sats sustaining >92%. Left resting on 5L           Home Living Family/patient expects to be discharged to:: Private residence Living Arrangements: Spouse/significant other Available Help at Discharge: Family;Available 24 hours/day Type of Home: House Home Access: Stairs to enter [employer] of Steps: 1 Entrance Stairs-Rails: None Home Layout: One level     Bathroom Shower/Tub: Chief Strategy Officer: Handicapped height Bathroom Accessibility: No   Home Equipment: None          Prior Functioning/Environment Prior Level of Function : Independent/Modified Independent             Mobility Comments: Ind, denies falls ADLs Comments: Ind    OT Problem List: Decreased strength;Decreased activity tolerance;Impaired balance (sitting and/or standing);Cardiopulmonary status limiting activity   OT Treatment/Interventions: Self-care/ADL training;Therapeutic exercise;Energy conservation;DME and/or AE instruction;Therapeutic activities;Patient/family education;Balance training      OT Goals(Current goals can be found in the care plan section)   Acute Rehab OT Goals Patient Stated Goal: To have surgery on Monday OT Goal Formulation: With patient Time For Goal Achievement: 08/09/24 Potential to Achieve Goals: Good   OT Frequency:  Min  2X/week       AM-PAC OT 6 Clicks Daily Activity     Outcome Measure Help from another person eating meals?: None Help from another person taking care of personal grooming?: A Little Help from another person toileting, which includes using toliet, bedpan, or urinal?: A Little Help from another person bathing (including washing, rinsing, drying)?: A Little Help from another person to put on and taking off regular upper body clothing?: A Little Help from another person to put on and taking off regular lower body clothing?: A Little 6 Click Score: 19   End of Session Equipment Utilized During Treatment: Gait belt;Oxygen Nurse Communication: Mobility status  Activity Tolerance: Patient tolerated treatment well Patient left: in bed;with call bell/phone within reach;with family/visitor present  OT Visit Diagnosis: Unsteadiness on feet (R26.81);Other abnormalities of gait and mobility (R26.89);Muscle weakness (generalized) (M62.81)                Time: 9242-9171 OT Time Calculation (min): 31 min Charges:  OT General Charges $OT Visit: 1 Visit OT Evaluation $OT Eval Moderate Complexity: 1 Mod OT Treatments $Self Care/Home Management : 8-22 mins  Adrianne BROCKS, OT  Acute Rehabilitation Services Office [phone] Secure chat preferred   Adrianne GORMAN Savers 07/26/2024, 8:48 AM

## 2024-07-26 NOTE — Progress Notes (Signed)
 Heart Failure Navigator Progress Note  Assessed for Heart & Vascular TOC clinic readiness.  Patient does not meet criteria due to she is an Advanced Heart Failure team patient of Dr. Zenaida. .   Navigator will sign off at this time.   Stephane Haddock, BSN, Scientist, Clinical (histocompatibility And Immunogenetics) Only

## 2024-07-26 NOTE — Plan of Care (Signed)

## 2024-07-26 NOTE — Progress Notes (Signed)
 " PROGRESS NOTE    Pam [name]  FMW:[id number] DOB: [birth year] DOA: 07/24/2024 PCP: Andrew Truman GRADE., MD   Brief Narrative:  Pam [name] is a 73 y.o. female with medical history significant of HFrEF - EF 35-40% with grade 1 diastolic dysfunction, ischemic cardiomyopathy s/p ICD placement, CAD, hyperlipidemia, peripheral artery disease, and prior tobacco abuse who presents with leg pain and abnormal lab results in preparation for bypass surgery with Dr. Silver scheduled for lower extremity bypass surgery on 12/29.  Patient noted to be markedly hypoxic in intake concerning for heart failure exacerbation, diuretics initiated at intake with notable hyperkalemia at 5.3.  Assessment & Plan:   Principal Problem:   Heart failure with reduced ejection fraction (HCC) Active Problems:   Acute respiratory failure with hypoxia (HCC)   Elevated troponin   Hyponatremia   Critical limb ischemia of right lower extremity (HCC)   COPD (chronic obstructive pulmonary disease) (HCC)   Essential hypertension   Hypothyroidism   Pressure injury of skin  Acute respiratory failure with hypoxia secondary to heart failure with reduced ejection fraction exacerbation, POA Bilateral pleural effusion Rule out concurrent sleep apnea -Patient does not require oxygen at baseline, currently on 4 L nasal cannula after being weaned down this morning from 7 L - Respiratory status appears to be improving - Restart diuresis today and follow for hypotension okay to continue if MAP>60 and not symptomatic - low threshold to DC if not tolerated - Recent echo earlier this month, no indication to repeat at this time - BNP markedly elevated at 4500 - Acutely worsening hypoxic overnight - would benefit from sleep study outpatient although family indicates she doesn't snore/have apneic events.   Hyponatremia, hypervolemic Hypochloremia Complicated by long term diuretic use Acute on chronic, trough 117 at intake, sodium now  approaching baseline high 120s/low 130s  Critical limb ischemia -Bypass graft scheduled 12/29 Dr. Silver, vascular surgery is aware of the patient's admission -No indication for consult at this time, unclear if patient's heart failure exacerbation will delay or interfere with patient's scheduled procedure  - Plavix  currently on hold per vascular's previous recommendations- continue statin  Elevated troponin -Likely multifactorial, ACS ruled out -Minimally elevated likely due to hypoxia and concurrent heart failure exacerbation - EKG and lack of symptoms reassuring   COPD, without acute exacerbation -Does not appear to be contributing to patient's hypoxia, no wheeze noted -Continue home inhaled medications, no signs or symptoms of infection at this time   Essential hypertension Currently hypotensive - Discontinue continue bisoprolol  and spironolactone  given borderline hypotension  Hypothyroidism - TSH elevated, recommend repeat outpatient once acute illness is resolved - Continue levothyroxine   DVT prophylaxis: enoxaparin  (LOVENOX ) injection 40 mg Start: 07/24/24 2200 Code Status:   Code Status: Full Code Family Communication: At bedside  Status is: Inpatient  Dispo: The patient is from: Home              Anticipated d/c is to: Home              Anticipated d/c date is: To be determined              Patient currently not medically stable for discharge  Consultants:  Vascular surgery  Procedures:  None currently planned, previously scheduled vascular intervention on 12/29  Antimicrobials:  None indicated  Subjective: Acutely worsening hypoxia overnight, resolved with supportive care and increased oxygen, able to wean back down to 4 L nasal cannula this morning without any complication.  Patient denies apneic events  or snoring.  She otherwise continues to complain of bilateral lower extremity pain but denies nausea vomiting diarrhea constipation headache fevers or  chills.  Objective: Vitals:   07/25/24 1800 07/25/24 1954 07/25/24 2315 07/26/24 0325  BP:  (!) 99/44 (!) 114/49 (!) 107/52  Pulse: 80 73 79 76  Resp: 18 19 18 15   Temp:  98 F (36.7 C) 97.9 F (36.6 C) 98 F (36.7 C)  TempSrc:  Oral Oral Oral  SpO2:  92% 91% (!) 88%  Weight:    52.1 kg  Height:        Intake/Output Summary (Last 24 hours) at 07/26/2024 0757 Last data filed at 07/26/2024 0500 Gross per 24 hour  Intake 320 ml  Output 800 ml  Net -480 ml   Filed Weights   07/24/24 1814 07/25/24 0500 07/26/24 0325  Weight: 53.1 kg 53.2 kg 52.1 kg    Examination:  General:  Pleasantly resting in bed, No acute distress. HEENT:  Normocephalic atraumatic.  Sclerae nonicteric, noninjected.  Extraocular movements intact bilaterally. Neck:  Without mass or deformity.  Trachea is midline. Lungs: Bibasilar rales. Heart:  Regular rate and rhythm.  Without murmurs, rubs, or gallops. Abdomen:  Soft, nontender, nondistended.  Without guarding or rebound. Extremities: Without cyanosis, clubbing, edema, or obvious deformity. Skin:  Warm and dry, no erythema.  Data Reviewed: I have personally reviewed following labs and imaging studies  CBC: Recent Labs  Lab 07/23/24 0915 07/24/24 1016 07/24/24 1540 07/25/24 0253  WBC 11.4* 10.9* 8.2 12.8*  NEUTROABS  --   --  5.9 10.0*  HGB 9.5* 9.0* 8.3* 8.1*  HCT 27.4* 26.2* 24.0* 23.0*  MCV 98.9 99.2 99.6 97.5  PLT 368 390 379 395   Basic Metabolic Panel: Recent Labs  Lab 07/23/24 0915 07/24/24 1016 07/24/24 1843 07/25/24 0253 07/26/24 0338  NA 121* 117* 119* 121* 119*  K 5.3* 5.1 4.4 4.2 4.3  CL 89* 86* 88* 88* 87*  CO2 23 15* 17* 23 23  GLUCOSE 103* 120* 130* 117* 110*  BUN 28* 34* 32* 33* 33*  CREATININE 0.88 0.99 0.97 0.98 0.98  CALCIUM  9.4 10.1 9.5 9.0 8.6*   GFR: Estimated Creatinine Clearance: 42.1 mL/min (by C-G formula based on SCr of 0.98 mg/dL). Liver Function Tests: Recent Labs  Lab 07/23/24 0915  07/24/24 1016  AST 28 56*  ALT 16 23  ALKPHOS 37* 39  BILITOT 0.4 0.4  PROT 6.4* 6.3*  ALBUMIN 4.2 4.1   No results for input(s): LIPASE, AMYLASE in the last 168 hours. No results for input(s): AMMONIA in the last 168 hours. Coagulation Profile: Recent Labs  Lab 07/23/24 0915 07/24/24 1016  INR 1.0 1.1   Cardiac Enzymes: No results for input(s): CKTOTAL, CKMB, CKMBINDEX, TROPONINI in the last 168 hours. BNP (last 3 results) Recent Labs    07/24/24 1016  PROBNP 4,537.0*   HbA1C: No results for input(s): HGBA1C in the last 72 hours. CBG: Recent Labs  Lab 07/25/24 1159  GLUCAP 95   Lipid Profile: No results for input(s): CHOL, HDL, LDLCALC, TRIG, CHOLHDL, LDLDIRECT in the last 72 hours. Thyroid  Function Tests: Recent Labs    07/24/24 1016  TSH 6.890*   Anemia Panel: No results for input(s): VITAMINB12, FOLATE, FERRITIN, TIBC, IRON, RETICCTPCT in the last 72 hours. Sepsis Labs: Recent Labs  Lab 07/24/24 1843  PROCALCITON <0.10    Recent Results (from the past 240 hours)  Surgical pcr screen     Status: None   Collection Time:  07/23/24  9:15 AM   Specimen: Nasal Mucosa; Nasal Swab  Result Value Ref Range Status   MRSA, PCR NEGATIVE NEGATIVE Final   Staphylococcus aureus NEGATIVE NEGATIVE Final    Comment: (NOTE) The Xpert SA Assay (FDA approved for NASAL specimens in patients 35 years of age and older), is one component of a comprehensive surveillance program. It is not intended to diagnose infection nor to guide or monitor treatment. Performed at [facility], [address], [place], [place] [address]   Resp panel by RT-PCR (RSV, Flu A&B, Covid) Anterior Nasal Swab     Status: None   Collection Time: 07/24/24 10:33 AM   Specimen: Anterior Nasal Swab  Result Value Ref Range Status   SARS Coronavirus 2 by RT PCR NEGATIVE NEGATIVE Final   Influenza A by PCR NEGATIVE NEGATIVE Final   Influenza B by PCR  NEGATIVE NEGATIVE Final    Comment: (NOTE) The Xpert Xpress SARS-CoV-2/FLU/RSV plus assay is intended as an aid in the diagnosis of influenza from Nasopharyngeal swab specimens and should not be used as a sole basis for treatment. Nasal washings and aspirates are unacceptable for Xpert Xpress SARS-CoV-2/FLU/RSV testing.  Fact Sheet for Patients: bloggercourse.com  Fact Sheet for Healthcare Providers: seriousbroker.it  This test is not yet approved or cleared by the [place]  FDA and has been authorized for detection and/or diagnosis of SARS-CoV-2 by FDA under an Emergency Use Authorization (EUA). This EUA will remain in effect (meaning this test can be used) for the duration of the COVID-19 declaration under Section 564(b)(1) of the Act, 21 U.S.C. section 360bbb-3(b)(1), unless the authorization is terminated or revoked.     Resp Syncytial Virus by PCR NEGATIVE NEGATIVE Final    Comment: (NOTE) Fact Sheet for Patients: bloggercourse.com  Fact Sheet for Healthcare Providers: seriousbroker.it  This test is not yet approved or cleared by the [place]  FDA and has been authorized for detection and/or diagnosis of SARS-CoV-2 by FDA under an Emergency Use Authorization (EUA). This EUA will remain in effect (meaning this test can be used) for the duration of the COVID-19 declaration under Section 564(b)(1) of the Act, 21 U.S.C. section 360bbb-3(b)(1), unless the authorization is terminated or revoked.  Performed at [facility], [address] [place], [place] [address]          Radiology Studies: DG Chest Port 1 View Result Date: 07/24/2024 CLINICAL DATA:  Shortness of breath. EXAM: PORTABLE CHEST 1 VIEW COMPARISON:  09/26/2022 FINDINGS: Interstitial and patchy airspace disease is seen in both lung bases with small bilateral pleural effusions. Cardiopericardial  silhouette is at upper limits of normal for size. Single lead left-sided AICD evident. Telemetry leads overlie the chest. IMPRESSION: Interstitial and patchy airspace disease in both lung bases with small bilateral pleural effusions. Imaging features suggest dependent pulmonary edema although pneumonia not excluded. Electronically Signed   By: Camellia Candle M.D.   On: 07/24/2024 10:59        Scheduled Meds:  atorvastatin   80 mg Oral Daily   digoxin   0.0625 mg Oral Daily   enoxaparin  (LOVENOX ) injection  40 mg Subcutaneous Q24H   furosemide   40 mg Intravenous BID   levothyroxine   37.5 mcg Oral Q0600   ranolazine   1,000 mg Oral BID   sodium chloride  flush  3 mL Intravenous Q12H   umeclidinium-vilanterol  1 puff Inhalation Daily   Continuous Infusions:     LOS: 2 days   Time spent:  Elsie JAYSON Montclair,  DO [facility]  If 7PM-7AM, please contact night-coverage [email or web address]  07/26/2024, 7:57 AM      "

## 2024-07-26 NOTE — Evaluation (Signed)
 Physical Therapy Evaluation Patient Details Name: Pam [name] MRN: [id number] DOB: 14-Nov-1950 Today's Date: 07/26/2024  History of Present Illness  Pt is a 73 y.o female admitted 12/24 for SOB and bil LE edema. Chest x-ray showed pulmonary edema. Pt is scheduled for bypass surgery on 12/29. PMH: CAD, CHF, PVD, ICD, HLD, PAD, NSTEMI, cataracts  Clinical Impression  Prior to recent events, pt was mobilizing independently and was independent with all ADLs. Pt presents to evaluation with deficits in strength, mobility, power, activity tolerance, pain, and balance. Pt was able to ambulate with AD and no physical assistance (see gait section). Pt would benefit from further gait training and LE strengthening. PT will continue to treat pt while she is admitted. Pending progress following surgery, recommending HHPT at discharge to address remaining mobility deficits and optimize return to PLOF.        If plan is discharge home, recommend the following: A little help with walking and/or transfers;A little help with bathing/dressing/bathroom;Assist for transportation;Help with stairs or ramp for entrance   Can travel by private vehicle        Equipment Recommendations Rolling walker (2 wheels)  Recommendations for Other Services       Functional Status Assessment Patient has had a recent decline in their functional status and demonstrates the ability to make significant improvements in function in a reasonable and predictable amount of time.     Precautions / Restrictions Precautions Precautions: Fall Recall of Precautions/Restrictions: Intact Precaution/Restrictions Comments: watch o2 Restrictions Weight Bearing Restrictions Per Provider Order: No      Mobility  Bed Mobility Overal bed mobility: Needs Assistance Bed Mobility: Supine to Sit       Sit to supine: Supervision   General bed mobility comments: increased time to complete. pt left sitting EOB for lunch     Transfers Overall transfer level: Needs assistance Equipment used: Rolling walker (2 wheels) Transfers: Sit to/from Stand Sit to Stand: Contact guard assist           General transfer comment: STS from EOB utilizing RW and no physical assistance. VC given for sequencing; increased time to complete.    Ambulation/Gait Ambulation/Gait assistance: Contact guard assist Gait Distance (Feet): 50 Feet Assistive device: Rolling walker (2 wheels) Gait Pattern/deviations: Step-through pattern, Decreased stride length, Trunk flexed Gait velocity: reduced Gait velocity interpretation: <1.8 ft/sec, indicate of risk for recurrent falls   General Gait Details: Pt required several standing rest breaks when mobilizing secondary to SOB and pain. VC given for pursed lip breathing. Pt reliant on RW for improved stability throughout  Stairs            Wheelchair Mobility     Tilt Bed    Modified Rankin (Stroke Patients Only)       Balance Overall balance assessment: Needs assistance Sitting-balance support: No upper extremity supported, Feet supported Sitting balance-Leahy Scale: Fair Sitting balance - Comments: seated EOB   Standing balance support: Bilateral upper extremity supported, During functional activity, Reliant on assistive device for balance Standing balance-Leahy Scale: Poor Standing balance comment: reliant on external support for stability                             Pertinent Vitals/Pain Pain Assessment Pain Assessment: Faces Faces Pain Scale: Hurts even more Pain Location: BLEs and heels Pain Descriptors / Indicators: Discomfort, Grimacing, Heaviness, Sore Pain Intervention(s): Limited activity within patient's tolerance, Monitored during session, RN gave pain meds during session  Home Living Family/patient expects to be discharged to:: Private residence Living Arrangements: Spouse/significant other Available Help at Discharge:  Family;Available 24 hours/day Type of Home: House Home Access: Stairs to enter Entrance Stairs-Rails: None Entrance Stairs-Number of Steps: 1   Home Layout: One level Home Equipment: None      Prior Function Prior Level of Function : Independent/Modified Independent             Mobility Comments: Ind, denies falls ADLs Comments: Ind     Extremity/Trunk Assessment   Upper Extremity Assessment Upper Extremity Assessment: Defer to OT evaluation    Lower Extremity Assessment Lower Extremity Assessment: Generalized weakness (3-/5 in BLEs secondary to pain)    Cervical / Trunk Assessment Cervical / Trunk Assessment: Normal  Communication   Communication Communication: Impaired Factors Affecting Communication: Hearing impaired    Cognition Arousal: Alert Behavior During Therapy: WFL for tasks assessed/performed   PT - Cognitive impairments: No apparent impairments                         Following commands: Intact       Cueing Cueing Techniques: Verbal cues     General Comments General comments (skin integrity, edema, etc.): Pt initially on 3L w/ SpO2 reading 96%. Mobilized on 3L with pt destating to 78% with inconsistent waveform, requiring 4L for remainder of ambulation to sustain >92%. Pt left in room on 3L with SpO2 reading 98%.    Exercises     Assessment/Plan    PT Assessment Patient needs continued PT services  PT Problem List Decreased strength;Decreased range of motion;Decreased activity tolerance;Decreased balance;Decreased mobility;Decreased coordination;Decreased knowledge of use of DME;Cardiopulmonary status limiting activity;Pain       PT Treatment Interventions DME instruction;Gait training;Stair training;Functional mobility training;Therapeutic activities;Therapeutic exercise;Balance training;Cognitive remediation;Patient/family education;Wheelchair mobility training;Manual techniques    PT Goals (Current goals can be found in the  Care Plan section)  Acute Rehab PT Goals Patient Stated Goal: to feel better PT Goal Formulation: With patient/family Time For Goal Achievement: 08/09/24 Potential to Achieve Goals: Good    Frequency Min 2X/week     Co-evaluation               AM-PAC PT 6 Clicks Mobility  Outcome Measure Help needed turning from your back to your side while in a flat bed without using bedrails?: A Little Help needed moving from lying on your back to sitting on the side of a flat bed without using bedrails?: A Little Help needed moving to and from a bed to a chair (including a wheelchair)?: A Little Help needed standing up from a chair using your arms (e.g., wheelchair or bedside chair)?: A Little Help needed to walk in hospital room?: A Little Help needed climbing 3-5 steps with a railing? : Total 6 Click Score: 16    End of Session Equipment Utilized During Treatment: Oxygen Activity Tolerance: Patient tolerated treatment well Patient left: in bed;with call bell/phone within reach;with bed alarm set;with family/visitor present Nurse Communication: Mobility status;Other (comment) (oxygen) PT Visit Diagnosis: Other abnormalities of gait and mobility (R26.89);Muscle weakness (generalized) (M62.81);Pain Pain - Right/Left:  (bilateral) Pain - part of body: Leg;Ankle and joints of foot    Time: 8859-8787 PT Time Calculation (min) (ACUTE ONLY): 32 min   Charges:   PT Evaluation $PT Eval Moderate Complexity: 1 Mod   PT General Charges $$ ACUTE PT VISIT: 1 Visit         Leontine Hilt DPT Acute  Rehab Services [phone] Prefer contact via chat   Leontine KATHEE Hilt 07/26/2024, 12:32 PM

## 2024-07-26 NOTE — Consult Note (Addendum)
 " Hospital Consult    Reason for Consult: Critical limb ischemia right lower extremity with rest pain Requesting Physician: [facility] MRN #:  [id number]  History of Present Illness: This is a 73 y.o. female with past medical history significant for CHF, ischemic cardiomyopathy status post ICD placement, CAD, hyperlipidemia.  Preoperative lab work and evaluation demonstrated CHF exacerbation and patient was admitted to the hospital for medical management.  She is scheduled for ax bifemoral bypass with Dr. Lanis on 07/29/2024.  She reports to have shortness of breath with minimal exertion and is currently on 3 L oxygen by nasal cannula.  She endorses regular right foot rest pain overnight which wakes her up.  She continues to have left calf claudication.  She denies any wounds or other tissue changes of bilateral lower extremities.  Her Plavix  is being held in preparation for surgery.  She is on a daily statin.  Past Medical History:  Diagnosis Date   Abnormal EKG    AICD (automatic cardioverter/defibrillator) present    [employer]   Alcoholic intoxication without complication    Allergy    Cardiomyopathy, ischemic    Cataract    Chest pain 08/29/2016   Chest pain 08/29/2016   CHF (congestive heart failure) (HCC)    Chronic systolic heart failure (HCC) 08/29/2016   Coronary artery disease    Facial laceration    Fall 07/07/2016   Hyperlipidemia    Hypertension 03/03/2017   Hyponatremia    Hypothyroidism 07/08/2016   NSTEMI (non-ST elevated myocardial infarction) (HCC)    Syncope    Thyroid  disease     Past Surgical History:  Procedure Laterality Date   CARDIAC CATHETERIZATION N/A 07/08/2016   Procedure: Left Heart Cath and Coronary Angiography;  Surgeon: Lonni Hanson, MD;  Location: [facility] INVASIVE CV LAB;  Service: Cardiovascular;  Laterality: N/A;   CARDIAC CATHETERIZATION N/A 08/29/2016   Procedure: Coronary Atherectomy;  Surgeon: Lonni Hanson, MD;  Location: [facility] INVASIVE CV LAB;   Service: Cardiovascular;  Laterality: N/A;   CARDIAC CATHETERIZATION N/A 08/29/2016   Procedure: Coronary Stent Intervention;  Surgeon: Lonni Hanson, MD;  Location: [facility] INVASIVE CV LAB;  Service: Cardiovascular;  Laterality: N/A;   CATARACT EXTRACTION, BILATERAL     COLONOSCOPY WITH PROPOFOL  N/A 11/18/2021   Procedure: COLONOSCOPY WITH PROPOFOL ;  Surgeon: Eda Iha, MD;  Location: [facility] ENDOSCOPY;  Service: Gastroenterology;  Laterality: N/A;   CORONARY ANGIOPLASTY     coronary stents      HAND SURGERY Right    Dr Alyse Gaba   ICD IMPLANT N/A 08/28/2019   Procedure: ICD IMPLANT;  Surgeon: Inocencio Soyla Lunger, MD;  Location: [facility] INVASIVE CV LAB;  Service: Cardiovascular;  Laterality: N/A;   LEFT HEART CATH AND CORONARY ANGIOGRAPHY N/A 04/29/2022   Procedure: LEFT HEART CATH AND CORONARY ANGIOGRAPHY;  Surgeon: Anner Alm ORN, MD;  Location: [facility] INVASIVE CV LAB;  Service: Cardiovascular;  Laterality: N/A;   LOWER EXTREMITY ANGIOGRAPHY N/A 06/19/2024   Procedure: Lower Extremity Angiography;  Surgeon: Darron Deatrice LABOR, MD;  Location: [facility] INVASIVE CV LAB;  Service: Cardiovascular;  Laterality: N/A;   PARATHYROIDECTOMY Right 02/02/2021   Procedure: RIGHT SUPERIOR PARATHYROIDECTOMY;  Surgeon: Eletha Boas, MD;  Location: [facility] ORS;  Service: General;  Laterality: Right;   POLYPECTOMY  11/18/2021   Procedure: POLYPECTOMY;  Surgeon: Eda Iha, MD;  Location: [facility] ENDOSCOPY;  Service: Gastroenterology;;   RIGHT HEART CATH N/A 08/11/2022   Procedure: RIGHT HEART CATH;  Surgeon: Gardenia Led, DO;  Location: [facility] INVASIVE CV LAB;  Service: Cardiovascular;  Laterality: N/A;   VAGINAL HYSTERECTOMY      Allergies[1]  Prior to Admission medications  Medication Sig Start Date End Date Taking? Authorizing Provider  acetaminophen  (TYLENOL ) 500 MG tablet Take 1,000 mg by mouth every 6 (six) hours as needed for moderate pain.   Yes [provider]  albuterol  (VENTOLIN  HFA) 108  (90 Base) MCG/ACT inhaler Inhale 2 puffs into the lungs every 6 (six) hours as needed for wheezing or shortness of breath. 12/28/23  Yes Hope Almarie ORN, NP  aspirin  81 MG chewable tablet Chew 81 mg by mouth daily.   Yes [provider]  atorvastatin  (LIPITOR ) 80 MG tablet TAKE 1 TABLET(80 MG) BY MOUTH DAILY 11/27/18  Yes Verlin Lonni BIRCH, MD  bisoprolol  (ZEBETA ) 5 MG tablet Take 0.5 tablets (2.5 mg total) by mouth daily. 06/26/24  Yes Milford, Harlene HERO, FNP  CALCIUM  PO Take 1 tablet by mouth daily.   Yes [provider]  Cholecalciferol (VITAMIN D3) 50 MCG (2000 UT) TABS Take 2,000 Units by mouth daily.   Yes [provider]  clopidogrel  (PLAVIX ) 75 MG tablet TAKE 1 TABLET(75 MG) BY MOUTH DAILY WITH BREAKFAST 11/27/23  Yes Sabharwal, Aditya, DO  digoxin  (LANOXIN ) 0.125 MG tablet Take 0.5 tablets (0.0625 mg total) by mouth daily. 11/22/23  Yes Milford, Harlene HERO, FNP  furosemide  (LASIX ) 20 MG tablet TAKE 1 TABLET BY MOUTH AS NEEDED 02/27/24  Yes Sabharwal, Aditya, DO  levothyroxine  (SYNTHROID ) 25 MCG tablet Take 37.5 mcg by mouth daily before breakfast. 07/10/23  Yes Thapa, Sudan, MD  nitroGLYCERIN  (NITROSTAT ) 0.4 MG SL tablet Place 1 tablet (0.4 mg total) under the tongue every 5 (five) minutes x 3 doses as needed for chest pain. 06/14/24  Yes Verlin Lonni BIRCH, MD  nystatin cream (MYCOSTATIN) Apply 1 Application topically 2 (two) times daily as needed (rash). 05/09/22  Yes [provider]  ranolazine  (RANEXA ) 1000 MG SR tablet TAKE 1 TABLET(1000 MG) BY MOUTH TWICE DAILY 02/09/24  Yes Milford, Safford, FNP  sacubitril -valsartan  (ENTRESTO ) 49-51 MG Take 1 tablet by mouth 2 (two) times daily. 05/01/24  Yes Clegg, Amy D, NP  spironolactone  (ALDACTONE ) 25 MG tablet TAKE 1 TABLET(25 MG) BY MOUTH DAILY 07/03/23  Yes Sabharwal, Aditya, DO  umeclidinium-vilanterol (ANORO ELLIPTA ) 62.5-25 MCG/ACT AEPB Inhale 1 puff into the lungs daily. 12/28/23  Yes Hope Almarie ORN, NP  valACYclovir  (VALTREX ) 1000 MG tablet Take 1,000 mg by mouth daily.   Yes [provider]    Social History   Socioeconomic History   Marital status: Married    Spouse name: Nickalos   Number of children: 2   Years of education: Not on file   Highest education level: High school graduate  Occupational History   Occupation: Retired-worked for post office  Tobacco Use   Smoking status: Former    Current packs/day: 0.00    Average packs/day: 1 pack/day for 50.0 years (50.0 ttl pk-yrs)    Types: Cigarettes    Start date: 07/07/1966    Quit date: 07/07/2016    Years since quitting: 8.0   Smokeless tobacco: Never  Vaping Use   Vaping status: Never Used  Substance and Sexual Activity   Alcohol use: Not Currently   Drug use: Never   Sexual activity: Not Currently  Other Topics Concern   Not on file  Social History Narrative   Not on file   Social Drivers of Health   Tobacco Use: Medium Risk (07/24/2024)   Patient  History    Smoking Tobacco Use: Former    Smokeless Tobacco Use: Never    Passive Exposure: Not on file  Financial Resource Strain: Low Risk (04/29/2022)   Overall Financial Resource Strain (CARDIA)    Difficulty of Paying Living Expenses: Not hard at all  Food Insecurity: No Food Insecurity (07/25/2024)   Epic    Worried About Programme Researcher, Broadcasting/film/video in the Last Year: Never true    Ran Out of Food in the Last Year: Never true  Transportation Needs: No Transportation Needs (07/25/2024)   Epic    Lack of Transportation (Medical): No    Lack of Transportation (Non-Medical): No  Physical Activity: Not on file  Stress: Not on file  Social Connections: Unknown (07/25/2024)   Social Connection and Isolation Panel    Frequency of Communication with Friends and Family: Never    Frequency of Social Gatherings with Friends and Family: Never    Attends Religious Services: Never    Database Administrator or Organizations: No    Attends Tax Inspector Meetings: Never    Marital Status: Not on file  Intimate Partner Violence: Unknown (07/25/2024)   Epic    Fear of Current or Ex-Partner: No    Emotionally Abused: No    Physically Abused: Not on file    Sexually Abused: No  Depression (PHQ2-9): Not on file  Alcohol Screen: Low Risk (04/29/2022)   Alcohol Screen    Last Alcohol Screening Score (AUDIT): 0  Housing: Unknown (07/25/2024)   Epic    Unable to Pay for Housing in the Last Year: No    Number of Times Moved in the Last Year: Not on file    Homeless in the Last Year: No  Utilities: Not At Risk (07/25/2024)   Epic    Threatened with loss of utilities: No  Health Literacy: Not on file    Family History  Problem Relation Age of Onset   Kidney failure Mother    Heart disease Mother        CHF   Heart disease Sister    Colon cancer Neg Hx    Esophageal cancer Neg Hx    Rectal cancer Neg Hx    Stomach cancer Neg Hx     ROS: Otherwise negative unless mentioned in HPI  Physical Examination  Vitals:   07/26/24 0755 07/26/24 1310  BP: (!) 98/45 (!) 94/48  Pulse: 73 69  Resp: (!) 24 18  Temp:    SpO2: 92% 92%   Body mass index is 20.35 kg/m.  General:  WDWN in NAD Gait: Not observed HENT: WNL, normocephalic Pulmonary: normal non-labored breathing on 3 L of O2 by Sanders Cardiac: regular Abdomen:  soft, NT/ND, no masses Skin: without rashes Vascular Exam/Pulses: Feet are warm without any open wounds or ulcerations Extremities: without ischemic changes, without Gangrene , without cellulitis; without open wounds;  Musculoskeletal: no muscle wasting or atrophy  Neurologic: A&O X 3;  No focal weakness or paresthesias are detected; speech is fluent/normal Psychiatric:  The pt has Normal affect. Lymph:  Unremarkable  CBC    Component Value Date/Time   WBC 12.8 (H) 07/25/2024 0253   RBC 2.36 (L) 07/25/2024 0253   HGB 8.1 (L) 07/25/2024 0253   HGB 13.6 06/11/2024 1039   HCT 23.0 (L) 07/25/2024 0253    HCT 39.0 06/11/2024 1039   PLT 395 07/25/2024 0253   PLT 335 06/11/2024 1039   MCV 97.5 07/25/2024 0253   MCV  102 (H) 06/11/2024 1039   MCH 34.3 (H) 07/25/2024 0253   MCHC 35.2 07/25/2024 0253   RDW 14.1 07/25/2024 0253   RDW 13.1 06/11/2024 1039   LYMPHSABS 1.4 07/25/2024 0253   LYMPHSABS 2.9 08/25/2016 1648   MONOABS 1.2 (H) 07/25/2024 0253   EOSABS 0.0 07/25/2024 0253   EOSABS 0.2 08/25/2016 1648   BASOSABS 0.0 07/25/2024 0253   BASOSABS 0.0 08/25/2016 1648    BMET    Component Value Date/Time   NA 120 (L) 07/26/2024 0950   NA 132 (L) 06/11/2024 1039   K 4.8 07/26/2024 0950   CL 88 (L) 07/26/2024 0950   CO2 24 07/26/2024 0950   GLUCOSE 125 (H) 07/26/2024 0950   BUN 35 (H) 07/26/2024 0950   BUN 15 06/11/2024 1039   CREATININE 1.05 (H) 07/26/2024 0950   CREATININE 1.13 (H) 05/30/2024 0851   CALCIUM  8.7 (L) 07/26/2024 0950   GFRNONAA 56 (L) 07/26/2024 0950   GFRAA 78 08/19/2019 0830    COAGS: Lab Results  Component Value Date   INR 1.1 07/24/2024   INR 1.0 07/23/2024   INR 1.1 08/25/2016     Non-Invasive Vascular Imaging:   None    ASSESSMENT/PLAN: This is a 73 y.o. female admitted with CHF exacerbation discovered on preoperative examination for axillobifemoral bypass surgery scheduled for 07/29/2024  Ms. Strathman is a 72 year old female who is scheduled for ax bifemoral bypass with Dr. Lanis on 07/29/2024 due to critical limb ischemia with tissue loss of the right lower extremity.  Preoperative evaluation and lab work revealed potential CHF exacerbation and she was admitted to the hospital for medical management.  She continues to require 3 L of oxygen at rest.  Primary team also correcting hyponatremia likely related to fluid overload.  Vascular surgeon on-call will evaluate the patient tomorrow to determine if we can safely proceed with axilla bifemoral bypass surgery on Monday however I suspect this may be delayed.  Dr. Serene will evaluate the patient later  today and provide further treatment plan.   Donnice Sender PA-C [facility] [phone]   I agree with the above.  I have seen and evaluated the patient.  She is scheduled for right axillary bifemoral bypass graft on Monday.  She was admitted for medical optimization prior to surgery.  We will continue to see her over the weekend and if cleared, her operation will be on Monday with Dr. Lanis Malvina Serene    [1]  Allergies Allergen Reactions   Lisinopril  Cough    Dry cough   "

## 2024-07-27 ENCOUNTER — Other Ambulatory Visit (HOSPITAL_COMMUNITY): Payer: Self-pay

## 2024-07-27 DIAGNOSIS — I5020 Unspecified systolic (congestive) heart failure: Secondary | ICD-10-CM | POA: Diagnosis not present

## 2024-07-27 LAB — BASIC METABOLIC PANEL WITH GFR
Anion gap: 8 (ref 5–15)
BUN: 29 mg/dL — ABNORMAL HIGH (ref 8–23)
CO2: 25 mmol/L (ref 22–32)
Calcium: 8.4 mg/dL — ABNORMAL LOW (ref 8.9–10.3)
Chloride: 86 mmol/L — ABNORMAL LOW (ref 98–111)
Creatinine, Ser: 0.87 mg/dL (ref 0.44–1.00)
GFR, Estimated: >60 mL/min
Glucose, Bld: 102 mg/dL — ABNORMAL HIGH (ref 70–99)
Potassium: 3.6 mmol/L (ref 3.5–5.1)
Sodium: 120 mmol/L — ABNORMAL LOW (ref 135–145)

## 2024-07-27 LAB — TROPONIN T: Troponin T (Highly Sensitive): 279 ng/L — ABNORMAL HIGH (ref 0–14)

## 2024-07-27 NOTE — Plan of Care (Signed)

## 2024-07-27 NOTE — Progress Notes (Signed)
 Physical Therapy Treatment Patient Details Name: Pam [name] MRN: [id number] DOB: 03/23/1951 Today's Date: 07/27/2024   History of Present Illness Pt is a 73 y.o female admitted 12/24 for SOB and bil LE edema. Chest x-ray showed pulmonary edema. Pt is scheduled for bypass surgery on 12/29. PMH: CAD, CHF, PVD, ICD, HLD, PAD, NSTEMI, cataracts    PT Comments  Pt received in supine and eager for mobility. Pt improved in today's session by requiring less assistance, supervision, for all mobility. Pt also able to increase gait distance to 240ft with use of RW. Pt continues to require frequent short standing rest breaks while ambulating due to fatigue. Educated on the importance of progressive walking program and mobility with pt verbalizing understanding. Also educated on incentive spirometer use with pt initially pulling . With subsequent repetitions, pt was only able to pull . Pt was fatigued at end of session with desire to rest. Will continue to follow acutely with current recommendation for HHPT.   89% SpO2 on RA 83% SpO2 on RA during gait 89-92% SpO2 on 4L during gait 94% SpO2 on 3L after session   If plan is discharge home, recommend the following: A little help with walking and/or transfers;A little help with bathing/dressing/bathroom;Assist for transportation;Help with stairs or ramp for entrance   Can travel by private vehicle      Yes  Equipment Recommendations  Rolling walker (2 wheels)       Precautions / Restrictions Precautions Precautions: Fall Recall of Precautions/Restrictions: Intact Precaution/Restrictions Comments: watch O2 Restrictions Weight Bearing Restrictions Per Provider Order: No     Mobility  Bed Mobility Overal bed mobility: Needs Assistance Bed Mobility: Supine to Sit, Sit to Supine    Supine to sit: Modified independent (Device/Increase time) Sit to supine: Supervision   General bed mobility comments: increased difficulty bringing BLE's  onto EOB, however, no physical assist required    Transfers Overall transfer level: Needs assistance Equipment used: Rolling walker (2 wheels) Transfers: Sit to/from Stand Sit to Stand: Supervision    General transfer comment: supervision for safety, cueing for hand placement    Ambulation/Gait Ambulation/Gait assistance: Supervision Gait Distance (Feet): 200 Feet Assistive device: Rolling walker (2 wheels) Gait Pattern/deviations: Step-through pattern, Decreased stride length, Trunk flexed Gait velocity: reduced     General Gait Details: Multiple standing rest breaks due to fatigue. Steady gait with light support of RW     Balance Overall balance assessment: Needs assistance Sitting-balance support: No upper extremity supported, Feet supported Sitting balance-Leahy Scale: Fair Sitting balance - Comments: seated EOB   Standing balance support: Bilateral upper extremity supported, During functional activity, Reliant on assistive device for balance Standing balance-Leahy Scale: Poor Standing balance comment: reliant on external support for stability       Communication Communication Communication: Impaired Factors Affecting Communication: Hearing impaired  Cognition Arousal: Alert Behavior During Therapy: WFL for tasks assessed/performed   PT - Cognitive impairments: No apparent impairments    Following commands: Intact      Cueing Cueing Techniques: Verbal cues  Exercises Other Exercises Other Exercises: x6 incentive spirometer pulls, at ~850 ml for initial pulls with regression to 200 ml        Pertinent Vitals/Pain Pain Assessment Pain Assessment: Faces Faces Pain Scale: Hurts little more Pain Location: R LE Pain Descriptors / Indicators: Discomfort, Grimacing, Heaviness, Sore Pain Intervention(s): Limited activity within patient's tolerance, Monitored during session, Premedicated before session, Repositioned     PT Goals (current goals can now be found  in  the care plan section) Acute Rehab PT Goals Patient Stated Goal: to feel better PT Goal Formulation: With patient/family Time For Goal Achievement: 08/09/24 Potential to Achieve Goals: Good Progress towards PT goals: Progressing toward goals    Frequency    Min 2X/week       AM-PAC PT 6 Clicks Mobility   Outcome Measure  Help needed turning from your back to your side while in a flat bed without using bedrails?: A Little Help needed moving from lying on your back to sitting on the side of a flat bed without using bedrails?: A Little Help needed moving to and from a bed to a chair (including a wheelchair)?: A Little Help needed standing up from a chair using your arms (e.g., wheelchair or bedside chair)?: A Little Help needed to walk in hospital room?: A Little Help needed climbing 3-5 steps with a railing? : Total 6 Click Score: 16    End of Session Equipment Utilized During Treatment: Oxygen Activity Tolerance: Patient tolerated treatment well Patient left: in bed;with call bell/phone within reach;with bed alarm set;with family/visitor present Nurse Communication: Mobility status;Other (comment) (O2 sats) PT Visit Diagnosis: Other abnormalities of gait and mobility (R26.89);Muscle weakness (generalized) (M62.81)     Time: 8892-8865 PT Time Calculation (min) (ACUTE ONLY): 27 min  Charges:    $Gait Training: 8-22 mins $Therapeutic Activity: 8-22 mins PT General Charges $$ ACUTE PT VISIT: 1 Visit                    Kate ORN, PT, DPT Secure Chat Preferred  Rehab Office [phone]   Kate BRAVO Wendolyn 07/27/2024, 12:02 PM

## 2024-07-27 NOTE — Progress Notes (Signed)
 SATURATION QUALIFICATIONS: (This note is used to comply with regulatory documentation for home oxygen)  Patient Saturations on Room Air at Rest = 89%  Patient Saturations on Room Air while Ambulating = 83%  Patient Saturations on 4 Liters of oxygen while Ambulating = 89%  Please briefly explain why patient needs home oxygen: Pt has failed alternative measures and needs home O2 to maintain SpO2 >88%.  Kate ORN, PT, DPT Secure Chat Preferred  Rehab Office [phone]

## 2024-07-27 NOTE — Progress Notes (Signed)
 " PROGRESS NOTE    Pam [name]  FMW:[id number] DOB: Nov 08, 1950 DOA: 07/24/2024 PCP: Andrew Truman GRADE., MD   Brief Narrative:  Pam [name] is a 73 y.o. female with medical history significant of HFrEF - EF 35-40% with grade 1 diastolic dysfunction, ischemic cardiomyopathy s/p ICD placement, CAD, hyperlipidemia, peripheral artery disease, and prior tobacco abuse who presents with leg pain and abnormal lab results in preparation for bypass surgery with Dr. Silver scheduled for lower extremity bypass surgery on 12/29.  Patient noted to be markedly hypoxic in intake concerning for heart failure exacerbation, diuretics initiated at intake with notable hyperkalemia at 5.3.  Assessment & Plan:   Principal Problem:   Heart failure with reduced ejection fraction (HCC) Active Problems:   Acute respiratory failure with hypoxia (HCC)   Elevated troponin   Hyponatremia   Critical limb ischemia of right lower extremity (HCC)   COPD (chronic obstructive pulmonary disease) (HCC)   Essential hypertension   Hypothyroidism   Pressure injury of skin  Acute respiratory failure with hypoxia secondary to heart failure with reduced ejection fraction exacerbation, POA Bilateral pleural effusion Rule out concurrent sleep apnea -Patient does not require oxygen at baseline, currently on 4 L nasal cannula after being weaned down this morning from 7 L - Respiratory status appears to be improving - Continue diuresis - tolerating well - no further hypotensive episodes - Recent echo earlier this month, no indication to repeat at this time - BNP elevated at 4500 - Recommend sleep study outpatient although family indicates she doesn't snore/have apneic events given nocturnal hypoxia here.   Hyponatremia, hypervolemic Hypochloremia Complicated by long term diuretic use Acute on chronic, trough 117 at intake, sodium now approaching baseline high 120s/low 130s  Critical limb ischemia -Bypass graft scheduled  12/29 Dr. Silver, vascular surgery is aware of the patient's admission -No indication for consult at this time, unclear if patient's heart failure exacerbation will delay or interfere with patient's scheduled procedure  - Plavix  currently on hold per vascular's previous recommendations- continue statin  Elevated troponin -Likely multifactorial, ACS ruled out -Minimally elevated likely due to hypoxia and concurrent heart failure exacerbation - EKG and lack of symptoms reassuring   COPD, without acute exacerbation -Does not appear to be contributing to patient's hypoxia, no wheeze noted -Continue home inhaled medications, no signs or symptoms of infection at this time   Essential hypertension Currently hypotensive - Discontinue continue bisoprolol  and spironolactone  given borderline hypotension  Hypothyroidism - TSH elevated, recommend repeat outpatient once acute illness is resolved - Continue levothyroxine   DVT prophylaxis: enoxaparin  (LOVENOX ) injection 40 mg Start: 07/24/24 2200 Code Status:   Code Status: Full Code Family Communication: At bedside  Status is: Inpatient  Dispo: The patient is from: Home              Anticipated d/c is to: Home              Anticipated d/c date is: To be determined              Patient currently not medically stable for discharge  Consultants:  Vascular surgery  Procedures:  None currently planned, previously scheduled vascular intervention on 12/29  Antimicrobials:  None indicated  Subjective: Acutely worsening hypoxia overnight, resolved with supportive care and increased oxygen, able to wean back down to 4 L nasal cannula this morning without any complication.  Patient denies apneic events or snoring.  She otherwise continues to complain of bilateral lower extremity pain but denies nausea vomiting  diarrhea constipation headache fevers or chills.  Objective: Vitals:   07/26/24 1936 07/26/24 2328 07/27/24 0325 07/27/24 0500  BP: (!)  104/46 (!) 107/51 (!) 103/57   Pulse: 72 74 72   Resp: 20 16 16    Temp: 97.8 F (36.6 C) 97.8 F (36.6 C) 97.7 F (36.5 C)   TempSrc: Temporal Temporal Temporal   SpO2: 94% 92% 96%   Weight:    52.2 kg  Height:        Intake/Output Summary (Last 24 hours) at 07/27/2024 0740 Last data filed at 07/27/2024 0617 Gross per 24 hour  Intake 840 ml  Output 2150 ml  Net -1310 ml   Filed Weights   07/25/24 0500 07/26/24 0325 07/27/24 0500  Weight: 53.2 kg 52.1 kg 52.2 kg    Examination:  General:  Pleasantly resting in bed, No acute distress. HEENT:  Normocephalic atraumatic.  Sclerae nonicteric, noninjected.  Extraocular movements intact bilaterally. Neck:  Without mass or deformity.  Trachea is midline. Lungs: Bibasilar rales. Heart:  Regular rate and rhythm.  Without murmurs, rubs, or gallops. Abdomen:  Soft, nontender, nondistended.  Without guarding or rebound. Extremities: Without cyanosis, clubbing, edema, or obvious deformity. Skin:  Warm and dry, no erythema.  Data Reviewed: I have personally reviewed following labs and imaging studies  CBC: Recent Labs  Lab 07/23/24 0915 07/24/24 1016 07/24/24 1540 07/25/24 0253  WBC 11.4* 10.9* 8.2 12.8*  NEUTROABS  --   --  5.9 10.0*  HGB 9.5* 9.0* 8.3* 8.1*  HCT 27.4* 26.2* 24.0* 23.0*  MCV 98.9 99.2 99.6 97.5  PLT 368 390 379 395   Basic Metabolic Panel: Recent Labs  Lab 07/24/24 1843 07/25/24 0253 07/26/24 0338 07/26/24 0950 07/27/24 0356  NA 119* 121* 119* 120* 120*  K 4.4 4.2 4.3 4.8 3.6  CL 88* 88* 87* 88* 86*  CO2 17* 23 23 24 25   GLUCOSE 130* 117* 110* 125* 102*  BUN 32* 33* 33* 35* 29*  CREATININE 0.97 0.98 0.98 1.05* 0.87  CALCIUM  9.5 9.0 8.6* 8.7* 8.4*   GFR: Estimated Creatinine Clearance: 47.5 mL/min (by C-G formula based on SCr of 0.87 mg/dL). Liver Function Tests: Recent Labs  Lab 07/23/24 0915 07/24/24 1016  AST 28 56*  ALT 16 23  ALKPHOS 37* 39  BILITOT 0.4 0.4  PROT 6.4* 6.3*   ALBUMIN 4.2 4.1   No results for input(s): LIPASE, AMYLASE in the last 168 hours. No results for input(s): AMMONIA in the last 168 hours. Coagulation Profile: Recent Labs  Lab 07/23/24 0915 07/24/24 1016  INR 1.0 1.1   Cardiac Enzymes: No results for input(s): CKTOTAL, CKMB, CKMBINDEX, TROPONINI in the last 168 hours. BNP (last 3 results) Recent Labs    07/24/24 1016  PROBNP 4,537.0*   HbA1C: No results for input(s): HGBA1C in the last 72 hours. CBG: Recent Labs  Lab 07/25/24 1159  GLUCAP 95   Lipid Profile: No results for input(s): CHOL, HDL, LDLCALC, TRIG, CHOLHDL, LDLDIRECT in the last 72 hours. Thyroid  Function Tests: Recent Labs    07/24/24 1016  TSH 6.890*   Anemia Panel: No results for input(s): VITAMINB12, FOLATE, FERRITIN, TIBC, IRON, RETICCTPCT in the last 72 hours. Sepsis Labs: Recent Labs  Lab 07/24/24 1843  PROCALCITON <0.10    Recent Results (from the past 240 hours)  Surgical pcr screen     Status: None   Collection Time: 07/23/24  9:15 AM   Specimen: Nasal Mucosa; Nasal Swab  Result Value Ref Range Status  MRSA, PCR NEGATIVE NEGATIVE Final   Staphylococcus aureus NEGATIVE NEGATIVE Final    Comment: (NOTE) The Xpert SA Assay (FDA approved for NASAL specimens in patients 35 years of age and older), is one component of a comprehensive surveillance program. It is not intended to diagnose infection nor to guide or monitor treatment. Performed at [facility], [address] [place], [place] [address]   Resp panel by RT-PCR (RSV, Flu A&B, Covid) Anterior Nasal Swab     Status: None   Collection Time: 07/24/24 10:33 AM   Specimen: Anterior Nasal Swab  Result Value Ref Range Status   SARS Coronavirus 2 by RT PCR NEGATIVE NEGATIVE Final   Influenza A by PCR NEGATIVE NEGATIVE Final   Influenza B by PCR NEGATIVE NEGATIVE Final    Comment: (NOTE) The Xpert Xpress SARS-CoV-2/FLU/RSV plus assay is  intended as an aid in the diagnosis of influenza from Nasopharyngeal swab specimens and should not be used as a sole basis for treatment. Nasal washings and aspirates are unacceptable for Xpert Xpress SARS-CoV-2/FLU/RSV testing.  Fact Sheet for Patients: bloggercourse.com  Fact Sheet for Healthcare Providers: seriousbroker.it  This test is not yet approved or cleared by the [place]  FDA and has been authorized for detection and/or diagnosis of SARS-CoV-2 by FDA under an Emergency Use Authorization (EUA). This EUA will remain in effect (meaning this test can be used) for the duration of the COVID-19 declaration under Section 564(b)(1) of the Act, 21 U.S.C. section 360bbb-3(b)(1), unless the authorization is terminated or revoked.     Resp Syncytial Virus by PCR NEGATIVE NEGATIVE Final    Comment: (NOTE) Fact Sheet for Patients: bloggercourse.com  Fact Sheet for Healthcare Providers: seriousbroker.it  This test is not yet approved or cleared by the [place]  FDA and has been authorized for detection and/or diagnosis of SARS-CoV-2 by FDA under an Emergency Use Authorization (EUA). This EUA will remain in effect (meaning this test can be used) for the duration of the COVID-19 declaration under Section 564(b)(1) of the Act, 21 U.S.C. section 360bbb-3(b)(1), unless the authorization is terminated or revoked.  Performed at [facility], [address] [place], [place] [address]          Radiology Studies: No results found.       Scheduled Meds:  atorvastatin   80 mg Oral Daily   digoxin   0.0625 mg Oral Daily   enoxaparin  (LOVENOX ) injection  40 mg Subcutaneous Q24H   furosemide   40 mg Intravenous BID   levothyroxine   37.5 mcg Oral Q0600   ranolazine   1,000 mg Oral BID   sodium chloride  flush  3 mL Intravenous Q12H   umeclidinium-vilanterol  1 puff  Inhalation Daily   valACYclovir   1,000 mg Oral Daily   Continuous Infusions:     LOS: 3 days   Time spent:  Elsie JAYSON Montclair, DO [facility]  If 7PM-7AM, please contact night-coverage [email or web address]  07/27/2024, 7:40 AM      "

## 2024-07-28 DIAGNOSIS — I509 Heart failure, unspecified: Secondary | ICD-10-CM | POA: Diagnosis not present

## 2024-07-28 DIAGNOSIS — J449 Chronic obstructive pulmonary disease, unspecified: Secondary | ICD-10-CM | POA: Diagnosis not present

## 2024-07-28 DIAGNOSIS — I871 Compression of vein: Secondary | ICD-10-CM | POA: Diagnosis not present

## 2024-07-28 DIAGNOSIS — I5020 Unspecified systolic (congestive) heart failure: Secondary | ICD-10-CM | POA: Diagnosis not present

## 2024-07-28 DIAGNOSIS — I70221 Atherosclerosis of native arteries of extremities with rest pain, right leg: Secondary | ICD-10-CM | POA: Diagnosis not present

## 2024-07-28 DIAGNOSIS — I70212 Atherosclerosis of native arteries of extremities with intermittent claudication, left leg: Secondary | ICD-10-CM | POA: Diagnosis not present

## 2024-07-28 LAB — BASIC METABOLIC PANEL WITH GFR
Anion gap: 8 (ref 5–15)
BUN: 26 mg/dL — ABNORMAL HIGH (ref 8–23)
CO2: 26 mmol/L (ref 22–32)
Calcium: 8.1 mg/dL — ABNORMAL LOW (ref 8.9–10.3)
Chloride: 86 mmol/L — ABNORMAL LOW (ref 98–111)
Creatinine, Ser: 0.88 mg/dL (ref 0.44–1.00)
GFR, Estimated: >60 mL/min
Glucose, Bld: 103 mg/dL — ABNORMAL HIGH (ref 70–99)
Potassium: 3.5 mmol/L (ref 3.5–5.1)
Sodium: 120 mmol/L — ABNORMAL LOW (ref 135–145)

## 2024-07-28 MED ORDER — CEFAZOLIN SODIUM-DEXTROSE 2-4 GM/100ML-% IV SOLN
2.0000 g | INTRAVENOUS | Status: AC
  Administered 2024-07-29: 2 g via INTRAVENOUS

## 2024-07-28 MED ORDER — FUROSEMIDE 10 MG/ML IJ SOLN
60.0000 mg | Freq: Two times a day (BID) | INTRAMUSCULAR | Status: DC
  Administered 2024-07-28: 60 mg via INTRAVENOUS
  Filled 2024-07-28: qty 6

## 2024-07-28 MED ORDER — POLYETHYLENE GLYCOL 3350 17 G PO PACK
17.0000 g | PACK | Freq: Two times a day (BID) | ORAL | Status: AC
  Administered 2024-07-28 – 2024-07-30 (×4): 17 g via ORAL
  Filled 2024-07-28 (×3): qty 1

## 2024-07-28 MED ORDER — FUROSEMIDE 20 MG PO TABS
20.0000 mg | ORAL_TABLET | Freq: Two times a day (BID) | ORAL | Status: DC
  Administered 2024-07-28 – 2024-07-30 (×3): 20 mg via ORAL
  Filled 2024-07-28: qty 1

## 2024-07-28 MED ORDER — CHLORHEXIDINE GLUCONATE CLOTH 2 % EX PADS
6.0000 | MEDICATED_PAD | Freq: Once | CUTANEOUS | Status: AC
  Administered 2024-07-29: 6 via TOPICAL

## 2024-07-28 MED ORDER — SODIUM CHLORIDE 0.9 % IV SOLN
INTRAVENOUS | Status: DC
  Administered 2024-07-29: 20 mL via INTRAVENOUS

## 2024-07-28 MED ORDER — CHLORHEXIDINE GLUCONATE CLOTH 2 % EX PADS
6.0000 | MEDICATED_PAD | Freq: Once | CUTANEOUS | Status: AC
  Administered 2024-07-28: 6 via TOPICAL

## 2024-07-28 MED ORDER — SENNOSIDES-DOCUSATE SODIUM 8.6-50 MG PO TABS
1.0000 | ORAL_TABLET | Freq: Two times a day (BID) | ORAL | Status: DC
  Administered 2024-07-28 – 2024-08-03 (×9): 1 via ORAL
  Filled 2024-07-28 (×3): qty 1

## 2024-07-28 NOTE — Plan of Care (Signed)

## 2024-07-28 NOTE — Progress Notes (Signed)
 [facility] OF [place] PROGRESS NOTE  ASSESSMENT / PLAN: Pam [name] is a 73 y.o. female with right lower extremity rest pain, left lower extremity claudication symptoms.  Scheduled for axillobifemoral bypass tomorrow, but her preoperative evaluation was worrisome for respiratory failure due to heart failure exacerbation, hyponatremia, COPD, etc.  Discussed in detail with Dr. Lue today.  Appears she is significantly improved and optimized for surgery tomorrow.  Reviewed plan for surgery.  She is amenable.  Keep n.p.o. after midnight.  SUBJECTIVE: Feels better overall.  Reviewed surgical plan with her in detail.  Discussed in detail with Dr. Lue.  OBJECTIVE: BP (!) 113/52 (BP Location: Right Arm)   Pulse 81   Temp 97.7 F (36.5 C) (Oral)   Resp (!) 21   Ht 5' 3 (1.6 m)   Wt 52.9 kg   SpO2 99%   BMI 20.66 kg/m   Intake/Output Summary (Last 24 hours) at 07/28/2024 1004 Last data filed at 07/28/2024 0900 Gross per 24 hour  Intake 606 ml  Output 1075 ml  Net -469 ml    No acute distress Regular rate and rhythm Unlabored breathing on nasal cannula Sitting at bedside      Latest Ref Rng & Units 07/25/2024    2:53 AM 07/24/2024    3:40 PM 07/24/2024   10:16 AM  CBC  WBC 4.0 - 10.5 K/uL 12.8  8.2  10.9   Hemoglobin 12.0 - 15.0 g/dL 8.1  8.3  9.0   Hematocrit 36.0 - 46.0 % 23.0  24.0  26.2   Platelets 150 - 400 K/uL 395  379  390         Latest Ref Rng & Units 07/28/2024    2:09 AM 07/27/2024    3:56 AM 07/26/2024    9:50 AM  CMP  Glucose 70 - 99 mg/dL 896  897  874   BUN 8 - 23 mg/dL 26  29  35   Creatinine 0.44 - 1.00 mg/dL 9.11  9.12  8.94   Sodium 135 - 145 mmol/L 120  120  120   Potassium 3.5 - 5.1 mmol/L 3.5  3.6  4.8   Chloride 98 - 111 mmol/L 86  86  88   CO2 22 - 32 mmol/L 26  25  24    Calcium  8.9 - 10.3 mg/dL 8.1  8.4  8.7     Estimated Creatinine Clearance: 47.1 mL/min (by C-G formula based on SCr of 0.88  mg/dL).  Debby SAILOR. Magda, MD [facility] Phone Number: [phone] 07/28/2024 10:04 AM

## 2024-07-28 NOTE — Progress Notes (Signed)
 " PROGRESS NOTE    [name]  FMW:[id number] DOB: 1951/07/23 DOA: 07/24/2024 PCP: Pam Truman GRADE., MD   Brief Narrative:  Pam [name] is a 73 y.o. female with medical history significant of HFrEF - EF 35-40% with grade 1 diastolic dysfunction, ischemic cardiomyopathy s/p ICD placement, CAD, hyperlipidemia, peripheral artery disease, and prior tobacco abuse who presents with leg pain and abnormal lab results in preparation for bypass surgery with Dr. Silver scheduled for lower extremity bypass surgery on 12/29.  Patient noted to be markedly hypoxic in intake concerning for heart failure exacerbation, diuretics initiated at intake with notable hyperkalemia at 5.3.  Assessment & Plan:   Principal Problem:   Heart failure with reduced ejection fraction (HCC) Active Problems:   Acute respiratory failure with hypoxia (HCC)   Elevated troponin   Hyponatremia   Critical limb ischemia of right lower extremity (HCC)   COPD (chronic obstructive pulmonary disease) (HCC)   Essential hypertension   Hypothyroidism   Pressure injury of skin  At this time given patient's improvement we will transition back to p.o. diuretics, medically stable for vascular procedure tomorrow 12/29.  Continue to monitor and wean oxygen supplementation as appropriate.  Postoperative care per vascular surgery.  Acute respiratory failure with hypoxia secondary to heart failure with reduced ejection fraction exacerbation, POA Bilateral pleural effusion Rule out concurrent sleep apnea -Patient does not require oxygen at baseline, continue to wean oxygen as appropriate - Respiratory status appears to be improving - Transition to scheduled p.o. diuretics, discontinue IV given ongoing hypotension - Recent echo earlier this month, no indication to repeat at this time - BNP elevated at 4500 - Recommend sleep study outpatient although family indicates she doesn't snore/have apneic events given nocturnal hypoxia here.    Hyponatremia, hypervolemic Hypochloremia Complicated by long term diuretic use Acute on chronic, trough 117 at intake, sodium now approaching baseline high 120s/low 130s  Critical limb ischemia, POA -Bypass graft scheduled 12/29 Dr. Silver, vascular surgery is aware of the patient's admission - Medically stable from my standpoint to undergo axillobifemoral bypass with vascular surgery on 12/29 - Plavix  currently on hold per vascular's previous recommendations- continue statin  Elevated troponin -Likely multifactorial, ACS ruled out -Minimally elevated likely due to hypoxia and concurrent heart failure exacerbation - EKG and lack of symptoms reassuring   COPD, without acute exacerbation -Does not appear to be contributing to patient's hypoxia, no wheeze noted -Continue home inhaled medications, no signs or symptoms of infection at this time   Essential hypertension Currently hypotensive - Discontinue continue bisoprolol  and spironolactone  given borderline hypotension - transition back to p.o. diuretics  Hypothyroidism - TSH elevated, recommend repeat outpatient once acute illness is resolved - Continue levothyroxine   DVT prophylaxis: enoxaparin  (LOVENOX ) injection 40 mg Start: 07/24/24 2200 Code Status:   Code Status: Full Code Family Communication: At bedside  Status is: Inpatient  Dispo: The patient is from: Home              Anticipated d/c is to: Home              Anticipated d/c date is: To be determined              Patient currently not medically stable for discharge  Consultants:  Vascular surgery  Procedures:  None currently planned, previously scheduled vascular intervention on 12/29  Antimicrobials:  None indicated  Subjective: Acutely worsening hypoxia overnight, resolved with supportive care and increased oxygen, able to wean back down to 4 L nasal  cannula this morning without any complication.  Patient denies apneic events or snoring.  She otherwise  continues to complain of bilateral lower extremity pain but denies nausea vomiting diarrhea constipation headache fevers or chills.  Objective: Vitals:   07/27/24 2000 07/28/24 0021 07/28/24 0400 07/28/24 0500  BP: (!) 110/41 (!) 115/55 (!) 120/56   Pulse: 74 71 70   Resp: 16 13 18    Temp: 98.4 F (36.9 C) 98.2 F (36.8 C) 97.9 F (36.6 C)   TempSrc: Oral Oral Oral   SpO2: 94% 93% 96%   Weight:    52.9 kg  Height:        Intake/Output Summary (Last 24 hours) at 07/28/2024 0718 Last data filed at 07/28/2024 0603 Gross per 24 hour  Intake 483 ml  Output 1075 ml  Net -592 ml   Filed Weights   07/26/24 0325 07/27/24 0500 07/28/24 0500  Weight: 52.1 kg 52.2 kg 52.9 kg    Examination:  General:  Pleasantly resting in bed, No acute distress. HEENT:  Normocephalic atraumatic.  Sclerae nonicteric, noninjected.  Extraocular movements intact bilaterally. Neck:  Without mass or deformity.  Trachea is midline. Lungs: Bibasilar rales. Heart:  Regular rate and rhythm.  Without murmurs, rubs, or gallops. Abdomen:  Soft, nontender, nondistended.  Without guarding or rebound. Extremities: Without cyanosis, clubbing, edema, or obvious deformity. Skin:  Warm and dry, no erythema.  Data Reviewed: I have personally reviewed following labs and imaging studies  CBC: Recent Labs  Lab 07/23/24 0915 07/24/24 1016 07/24/24 1540 07/25/24 0253  WBC 11.4* 10.9* 8.2 12.8*  NEUTROABS  --   --  5.9 10.0*  HGB 9.5* 9.0* 8.3* 8.1*  HCT 27.4* 26.2* 24.0* 23.0*  MCV 98.9 99.2 99.6 97.5  PLT 368 390 379 395   Basic Metabolic Panel: Recent Labs  Lab 07/25/24 0253 07/26/24 0338 07/26/24 0950 07/27/24 0356 07/28/24 0209  NA 121* 119* 120* 120* 120*  K 4.2 4.3 4.8 3.6 3.5  CL 88* 87* 88* 86* 86*  CO2 23 23 24 25 26   GLUCOSE 117* 110* 125* 102* 103*  BUN 33* 33* 35* 29* 26*  CREATININE 0.98 0.98 1.05* 0.87 0.88  CALCIUM  9.0 8.6* 8.7* 8.4* 8.1*   GFR: Estimated Creatinine Clearance:  47.1 mL/min (by C-G formula based on SCr of 0.88 mg/dL). Liver Function Tests: Recent Labs  Lab 07/23/24 0915 07/24/24 1016  AST 28 56*  ALT 16 23  ALKPHOS 37* 39  BILITOT 0.4 0.4  PROT 6.4* 6.3*  ALBUMIN 4.2 4.1   No results for input(s): LIPASE, AMYLASE in the last 168 hours. No results for input(s): AMMONIA in the last 168 hours. Coagulation Profile: Recent Labs  Lab 07/23/24 0915 07/24/24 1016  INR 1.0 1.1   Cardiac Enzymes: No results for input(s): CKTOTAL, CKMB, CKMBINDEX, TROPONINI in the last 168 hours. BNP (last 3 results) Recent Labs    07/24/24 1016  PROBNP 4,537.0*   HbA1C: No results for input(s): HGBA1C in the last 72 hours. CBG: Recent Labs  Lab 07/25/24 1159  GLUCAP 95   Lipid Profile: No results for input(s): CHOL, HDL, LDLCALC, TRIG, CHOLHDL, LDLDIRECT in the last 72 hours. Thyroid  Function Tests: No results for input(s): TSH, T4TOTAL, FREET4, T3FREE, THYROIDAB in the last 72 hours.  Anemia Panel: No results for input(s): VITAMINB12, FOLATE, FERRITIN, TIBC, IRON, RETICCTPCT in the last 72 hours. Sepsis Labs: Recent Labs  Lab 07/24/24 1843  PROCALCITON <0.10    Recent Results (from the past 240 hours)  Surgical pcr screen     Status: None   Collection Time: 07/23/24  9:15 AM   Specimen: Nasal Mucosa; Nasal Swab  Result Value Ref Range Status   MRSA, PCR NEGATIVE NEGATIVE Final   Staphylococcus aureus NEGATIVE NEGATIVE Final    Comment: (NOTE) The Xpert SA Assay (FDA approved for NASAL specimens in patients 41 years of age and older), is one component of a comprehensive surveillance program. It is not intended to diagnose infection nor to guide or monitor treatment. Performed at [facility], [address] [place], [place] [address]   Resp panel by RT-PCR (RSV, Flu A&B, Covid) Anterior Nasal Swab     Status: None   Collection Time: 07/24/24 10:33 AM   Specimen: Anterior Nasal  Swab  Result Value Ref Range Status   SARS Coronavirus 2 by RT PCR NEGATIVE NEGATIVE Final   Influenza A by PCR NEGATIVE NEGATIVE Final   Influenza B by PCR NEGATIVE NEGATIVE Final    Comment: (NOTE) The Xpert Xpress SARS-CoV-2/FLU/RSV plus assay is intended as an aid in the diagnosis of influenza from Nasopharyngeal swab specimens and should not be used as a sole basis for treatment. Nasal washings and aspirates are unacceptable for Xpert Xpress SARS-CoV-2/FLU/RSV testing.  Fact Sheet for Patients: bloggercourse.com  Fact Sheet for Healthcare Providers: seriousbroker.it  This test is not yet approved or cleared by the [place]  FDA and has been authorized for detection and/or diagnosis of SARS-CoV-2 by FDA under an Emergency Use Authorization (EUA). This EUA will remain in effect (meaning this test can be used) for the duration of the COVID-19 declaration under Section 564(b)(1) of the Act, 21 U.S.C. section 360bbb-3(b)(1), unless the authorization is terminated or revoked.     Resp Syncytial Virus by PCR NEGATIVE NEGATIVE Final    Comment: (NOTE) Fact Sheet for Patients: bloggercourse.com  Fact Sheet for Healthcare Providers: seriousbroker.it  This test is not yet approved or cleared by the [place]  FDA and has been authorized for detection and/or diagnosis of SARS-CoV-2 by FDA under an Emergency Use Authorization (EUA). This EUA will remain in effect (meaning this test can be used) for the duration of the COVID-19 declaration under Section 564(b)(1) of the Act, 21 U.S.C. section 360bbb-3(b)(1), unless the authorization is terminated or revoked.  Performed at [facility], [address], [place], [place] [address]          Radiology Studies: No results found.       Scheduled Meds:  atorvastatin   80 mg Oral Daily   digoxin   0.0625 mg Oral  Daily   enoxaparin  (LOVENOX ) injection  40 mg Subcutaneous Q24H   furosemide   40 mg Intravenous BID   levothyroxine   37.5 mcg Oral Q0600   ranolazine   1,000 mg Oral BID   sodium chloride  flush  3 mL Intravenous Q12H   umeclidinium-vilanterol  1 puff Inhalation Daily   valACYclovir   1,000 mg Oral Daily   Continuous Infusions:     LOS: 4 days   Time spent:  Elsie JAYSON Montclair, DO [facility]  If 7PM-7AM, please contact night-coverage [email or web address]  07/28/2024, 7:18 AM      "

## 2024-07-28 NOTE — Progress Notes (Signed)
 Mobility Specialist Progress Note:    07/28/24 1250  Mobility  Activity Ambulated with assistance  Level of Assistance Contact guard assist, steadying assist  Assistive Device Front wheel walker  Distance Ambulated (ft) 200 ft  Activity Response Tolerated well  Mobility Referral Yes  Mobility visit 1 Mobility  Mobility Specialist Start Time (ACUTE ONLY) 0930  Mobility Specialist Stop Time (ACUTE ONLY) 0944  Mobility Specialist Time Calculation (min) (ACUTE ONLY) 14 min   Pt received in bed agreeable to mobility. Found on 2L/min Spo2 94%. Attempted to ambulate on 2L/min but d/t dest to 82% O2 flow increased to 6L/min to keep SPO2 above 88%. Returned to room w/o fault. Left in bed w/ call bell and personal belongings in reach. Left on 2L/min. All needs met.   Thersia Minder Mobility Specialist  Please contact vis Secure Chat or  Rehab Office [phone]

## 2024-07-29 ENCOUNTER — Encounter (HOSPITAL_COMMUNITY)
Admission: EM | Disposition: A | Discharge status: Hospice | Payer: Self-pay | Source: Ambulatory Visit | Attending: Internal Medicine

## 2024-07-29 ENCOUNTER — Other Ambulatory Visit: Payer: Self-pay

## 2024-07-29 ENCOUNTER — Encounter (HOSPITAL_COMMUNITY): Payer: Self-pay | Admitting: Internal Medicine

## 2024-07-29 ENCOUNTER — Inpatient Hospital Stay (HOSPITAL_COMMUNITY): Payer: Self-pay | Admitting: Vascular Surgery

## 2024-07-29 ENCOUNTER — Inpatient Hospital Stay (HOSPITAL_COMMUNITY): Admission: RE | Admit: 2024-07-29 | Source: Home / Self Care | Admitting: Vascular Surgery

## 2024-07-29 DIAGNOSIS — I70223 Atherosclerosis of native arteries of extremities with rest pain, bilateral legs: Secondary | ICD-10-CM

## 2024-07-29 DIAGNOSIS — I509 Heart failure, unspecified: Secondary | ICD-10-CM | POA: Diagnosis not present

## 2024-07-29 DIAGNOSIS — I998 Other disorder of circulatory system: Secondary | ICD-10-CM | POA: Diagnosis present

## 2024-07-29 DIAGNOSIS — Z95828 Presence of other vascular implants and grafts: Secondary | ICD-10-CM | POA: Diagnosis not present

## 2024-07-29 DIAGNOSIS — I5020 Unspecified systolic (congestive) heart failure: Secondary | ICD-10-CM | POA: Diagnosis not present

## 2024-07-29 HISTORY — PX: ENDARTERECTOMY FEMORAL: SHX5804

## 2024-07-29 HISTORY — PX: AXILLARY-FEMORAL BYPASS GRAFT: SHX894

## 2024-07-29 LAB — COMPREHENSIVE METABOLIC PANEL WITH GFR
ALT: 34 U/L (ref 0–44)
AST: 40 U/L (ref 15–41)
Albumin: 3.8 g/dL (ref 3.5–5.0)
Alkaline Phosphatase: 54 U/L (ref 38–126)
Anion gap: 11 (ref 5–15)
BUN: 25 mg/dL — ABNORMAL HIGH (ref 8–23)
CO2: 26 mmol/L (ref 22–32)
Calcium: 8.7 mg/dL — ABNORMAL LOW (ref 8.9–10.3)
Chloride: 85 mmol/L — ABNORMAL LOW (ref 98–111)
Creatinine, Ser: 1 mg/dL (ref 0.44–1.00)
GFR, Estimated: 59 mL/min — ABNORMAL LOW
Glucose, Bld: 119 mg/dL — ABNORMAL HIGH (ref 70–99)
Potassium: 4 mmol/L (ref 3.5–5.1)
Sodium: 122 mmol/L — ABNORMAL LOW (ref 135–145)
Total Bilirubin: 0.3 mg/dL (ref 0.0–1.2)
Total Protein: 5.9 g/dL — ABNORMAL LOW (ref 6.5–8.1)

## 2024-07-29 LAB — TYPE AND SCREEN
ABO/RH(D): A POS
Antibody Screen: NEGATIVE

## 2024-07-29 LAB — BASIC METABOLIC PANEL WITH GFR
Anion gap: 10 (ref 5–15)
BUN: 23 mg/dL (ref 8–23)
CO2: 25 mmol/L (ref 22–32)
Calcium: 8.6 mg/dL — ABNORMAL LOW (ref 8.9–10.3)
Chloride: 86 mmol/L — ABNORMAL LOW (ref 98–111)
Creatinine, Ser: 0.85 mg/dL (ref 0.44–1.00)
GFR, Estimated: >60 mL/min
Glucose, Bld: 112 mg/dL — ABNORMAL HIGH (ref 70–99)
Potassium: 3.4 mmol/L — ABNORMAL LOW (ref 3.5–5.1)
Sodium: 120 mmol/L — ABNORMAL LOW (ref 135–145)

## 2024-07-29 MED ORDER — PHENYLEPHRINE 80 MCG/ML (10ML) SYRINGE FOR IV PUSH (FOR BLOOD PRESSURE SUPPORT)
PREFILLED_SYRINGE | INTRAVENOUS | Status: DC | PRN
  Administered 2024-07-29: 160 ug via INTRAVENOUS

## 2024-07-29 MED ORDER — HEPARIN 6000 UNIT IRRIGATION SOLUTION
Status: DC | PRN
  Administered 2024-07-29: 1

## 2024-07-29 MED ORDER — ROCURONIUM BROMIDE 10 MG/ML (PF) SYRINGE
PREFILLED_SYRINGE | INTRAVENOUS | Status: AC
  Filled 2024-07-29: qty 10

## 2024-07-29 MED ORDER — HYDROMORPHONE HCL 1 MG/ML IJ SOLN: 0.5000 mg | INTRAMUSCULAR | Status: DC | PRN

## 2024-07-29 MED ORDER — 0.9 % SODIUM CHLORIDE (POUR BTL) OPTIME
TOPICAL | Status: DC | PRN
  Administered 2024-07-29: 1000 mL

## 2024-07-29 MED ORDER — HYDRALAZINE HCL 20 MG/ML IJ SOLN: 5.0000 mg | INTRAMUSCULAR | Status: DC | PRN

## 2024-07-29 MED ORDER — HEMOSTATIC AGENTS (NO CHARGE) OPTIME
TOPICAL | Status: DC | PRN
  Administered 2024-07-29 (×2): 1 via TOPICAL

## 2024-07-29 MED ORDER — ONDANSETRON HCL 4 MG/2ML IJ SOLN
INTRAMUSCULAR | Status: AC
  Filled 2024-07-29: qty 2

## 2024-07-29 MED ORDER — HEPARIN SODIUM (PORCINE) 1000 UNIT/ML IJ SOLN
INTRAMUSCULAR | Status: AC
  Filled 2024-07-29: qty 10

## 2024-07-29 MED ORDER — CEFAZOLIN SODIUM-DEXTROSE 2-4 GM/100ML-% IV SOLN
2.0000 g | Freq: Three times a day (TID) | INTRAVENOUS | Status: AC
  Administered 2024-07-29 – 2024-07-30 (×2): 2 g via INTRAVENOUS
  Filled 2024-07-29 (×2): qty 100

## 2024-07-29 MED ORDER — OXIDIZED CELLULOSE EX PADS
MEDICATED_PAD | CUTANEOUS | Status: DC | PRN
  Administered 2024-07-29: 1 via TOPICAL

## 2024-07-29 MED ORDER — PROTAMINE SULFATE 10 MG/ML IV SOLN
INTRAVENOUS | Status: DC | PRN
  Administered 2024-07-29: 50 mg via INTRAVENOUS

## 2024-07-29 MED ORDER — PHENYLEPHRINE HCL-NACL 20-0.9 MG/250ML-% IV SOLN
INTRAVENOUS | Status: DC | PRN
  Administered 2024-07-29: 40 ug/min via INTRAVENOUS

## 2024-07-29 MED ORDER — AMISULPRIDE (ANTIEMETIC) 5 MG/2ML IV SOLN: 10.0000 mg | Freq: Once | INTRAVENOUS | Status: DC | PRN

## 2024-07-29 MED ORDER — SODIUM CHLORIDE 0.9 % IV SOLN
500.0000 mL | Freq: Once | INTRAVENOUS | Status: AC | PRN
  Administered 2024-07-30: 500 mL via INTRAVENOUS

## 2024-07-29 MED ORDER — PROPOFOL 10 MG/ML IV BOLUS
INTRAVENOUS | Status: DC | PRN
  Administered 2024-07-29: 70 mg via INTRAVENOUS

## 2024-07-29 MED ORDER — HEMOSTATIC AGENTS (NO CHARGE) OPTIME
TOPICAL | Status: DC | PRN
  Administered 2024-07-29: 1 via TOPICAL

## 2024-07-29 MED ORDER — ROCURONIUM BROMIDE 10 MG/ML (PF) SYRINGE
PREFILLED_SYRINGE | INTRAVENOUS | Status: DC | PRN
  Administered 2024-07-29: 20 mg via INTRAVENOUS
  Administered 2024-07-29: 50 mg via INTRAVENOUS

## 2024-07-29 MED ORDER — POTASSIUM CHLORIDE 10 MEQ/100ML IV SOLN: 10.0000 meq | INTRAVENOUS | Status: AC

## 2024-07-29 MED ORDER — PHENOL 1.4 % MT LIQD: 1.0000 | OROMUCOSAL | Status: DC | PRN

## 2024-07-29 MED ORDER — CLOPIDOGREL BISULFATE 75 MG PO TABS
75.0000 mg | ORAL_TABLET | Freq: Every day | ORAL | Status: AC
  Administered 2024-07-29 – 2024-08-05 (×8): 75 mg via ORAL
  Filled 2024-07-29 (×8): qty 1

## 2024-07-29 MED ORDER — FENTANYL CITRATE (PF) 250 MCG/5ML IJ SOLN
INTRAMUSCULAR | Status: DC | PRN
  Administered 2024-07-29: 50 ug via INTRAVENOUS

## 2024-07-29 MED ORDER — ALBUMIN HUMAN 5 % IV SOLN: INTRAVENOUS | Status: DC | PRN

## 2024-07-29 MED ORDER — OXYCODONE HCL 5 MG PO TABS: 5.0000 mg | ORAL_TABLET | Freq: Once | ORAL | Status: DC | PRN

## 2024-07-29 MED ORDER — LIDOCAINE 2% (20 MG/ML) 5 ML SYRINGE
INTRAMUSCULAR | Status: AC
  Filled 2024-07-29: qty 5

## 2024-07-29 MED ORDER — PHENYLEPHRINE 80 MCG/ML (10ML) SYRINGE FOR IV PUSH (FOR BLOOD PRESSURE SUPPORT)
PREFILLED_SYRINGE | INTRAVENOUS | Status: AC
  Filled 2024-07-29: qty 10

## 2024-07-29 MED ORDER — ASPIRIN 81 MG PO TBEC
81.0000 mg | DELAYED_RELEASE_TABLET | Freq: Every day | ORAL | Status: DC
  Administered 2024-07-29 – 2024-08-13 (×16): 81 mg via ORAL
  Filled 2024-07-29 (×16): qty 1

## 2024-07-29 MED ORDER — SUGAMMADEX SODIUM 200 MG/2ML IV SOLN
INTRAVENOUS | Status: DC | PRN
  Administered 2024-07-29: 200 mg via INTRAVENOUS

## 2024-07-29 MED ORDER — CHLORHEXIDINE GLUCONATE 0.12 % MT SOLN
OROMUCOSAL | Status: AC
  Administered 2024-07-29: 15 mL via OROMUCOSAL
  Filled 2024-07-29: qty 15

## 2024-07-29 MED ORDER — FENTANYL CITRATE (PF) 100 MCG/2ML IJ SOLN
25.0000 ug | INTRAMUSCULAR | Status: DC | PRN
  Administered 2024-07-29: 25 ug via INTRAVENOUS

## 2024-07-29 MED ORDER — HEPARIN SODIUM (PORCINE) 1000 UNIT/ML IJ SOLN
INTRAMUSCULAR | Status: DC | PRN
  Administered 2024-07-29: 1000 [IU] via INTRAVENOUS
  Administered 2024-07-29: 3000 [IU] via INTRAVENOUS
  Administered 2024-07-29: 5000 [IU] via INTRAVENOUS

## 2024-07-29 MED ORDER — DEXAMETHASONE SOD PHOSPHATE PF 10 MG/ML IJ SOLN
INTRAMUSCULAR | Status: DC | PRN
  Administered 2024-07-29: 10 mg via INTRAVENOUS

## 2024-07-29 MED ORDER — ORAL CARE MOUTH RINSE: 15.0000 mL | Freq: Once | OROMUCOSAL | Status: AC

## 2024-07-29 MED ORDER — CHLORHEXIDINE GLUCONATE 0.12 % MT SOLN: 15.0000 mL | Freq: Once | OROMUCOSAL | Status: AC

## 2024-07-29 MED ORDER — ENOXAPARIN SODIUM 40 MG/0.4ML IJ SOSY
40.0000 mg | PREFILLED_SYRINGE | INTRAMUSCULAR | Status: DC
  Administered 2024-07-30 – 2024-08-05 (×7): 40 mg via SUBCUTANEOUS
  Filled 2024-07-29 (×7): qty 0.4

## 2024-07-29 MED ORDER — PROTAMINE SULFATE 10 MG/ML IV SOLN
INTRAVENOUS | Status: AC
  Filled 2024-07-29: qty 5

## 2024-07-29 MED ORDER — LACTATED RINGERS IV SOLN: INTRAVENOUS | Status: DC | PRN

## 2024-07-29 MED ORDER — OXYCODONE HCL 5 MG/5ML PO SOLN: 5.0000 mg | Freq: Once | ORAL | Status: DC | PRN

## 2024-07-29 MED ORDER — LACTATED RINGERS IV SOLN: INTRAVENOUS | Status: DC

## 2024-07-29 MED ORDER — LIDOCAINE 2% (20 MG/ML) 5 ML SYRINGE
INTRAMUSCULAR | Status: DC | PRN
  Administered 2024-07-29: 40 mg via INTRAVENOUS

## 2024-07-29 MED ORDER — ONDANSETRON HCL 4 MG/2ML IJ SOLN
INTRAMUSCULAR | Status: DC | PRN
  Administered 2024-07-29: 4 mg via INTRAVENOUS

## 2024-07-29 MED ORDER — POTASSIUM CHLORIDE CRYS ER 20 MEQ PO TBCR
40.0000 meq | EXTENDED_RELEASE_TABLET | Freq: Every day | ORAL | Status: DC | PRN
  Administered 2024-07-29: 60 meq via ORAL
  Filled 2024-07-29: qty 3
  Filled 2024-07-29: qty 2

## 2024-07-29 MED ORDER — LABETALOL HCL 5 MG/ML IV SOLN: 10.0000 mg | INTRAVENOUS | Status: DC | PRN

## 2024-07-29 MED ORDER — FENTANYL CITRATE (PF) 100 MCG/2ML IJ SOLN
INTRAMUSCULAR | Status: AC
  Filled 2024-07-29: qty 2

## 2024-07-29 SURGICAL SUPPLY — 1 items: Gore Propaten Axillobifemoral Vascular Graft Remov (Graft) IMPLANT

## 2024-07-29 NOTE — Anesthesia Procedure Notes (Signed)
 Procedure Name: Intubation Date/Time: 07/29/2024 10:08 AM  Performed by: Christopher Comings, CRNAPre-anesthesia Checklist: Patient identified, Emergency Drugs available, Suction available and Patient being monitored Patient Re-evaluated:Patient Re-evaluated prior to induction Oxygen Delivery Method: Circle system utilized Preoxygenation: Pre-oxygenation with 100% oxygen Induction Type: IV induction Ventilation: Mask ventilation without difficulty Laryngoscope Size: Mac and 3 Grade View: Grade I Tube type: Oral Tube size: 7.0 mm Number of attempts: 1 Airway Equipment and Method: Stylet and Oral airway Placement Confirmation: ETT inserted through vocal cords under direct vision, positive ETCO2 and breath sounds checked- equal and bilateral Secured at: 22 cm Tube secured with: Tape Dental Injury: Teeth and Oropharynx as per pre-operative assessment

## 2024-07-29 NOTE — Progress Notes (Signed)
 [facility] [place] PROGRESS NOTE  SUBJECTIVE: No response today, but feels like she has improved significantly.  Continues to have right lower extremity rest pain, noting worsening pain on the left as well.  OBJECTIVE: BP (!) 115/52   Pulse 81   Temp 97.9 F (36.6 C)   Resp 19   Ht 5' 3 (1.6 m)   Wt 52.2 kg   SpO2 91%   BMI 20.39 kg/m   Intake/Output Summary (Last 24 hours) at 07/29/2024 9062 Last data filed at 07/28/2024 2350 Gross per 24 hour  Intake 603 ml  Output 1350 ml  Net -747 ml    No acute distress Regular rate and rhythm Unlabored breathing on nasal cannula 3 L Sitting at bedside      Latest Ref Rng & Units 07/25/2024    2:53 AM 07/24/2024    3:40 PM 07/24/2024   10:16 AM  CBC  WBC 4.0 - 10.5 K/uL 12.8  8.2  10.9   Hemoglobin 12.0 - 15.0 g/dL 8.1  8.3  9.0   Hematocrit 36.0 - 46.0 % 23.0  24.0  26.2   Platelets 150 - 400 K/uL 395  379  390         Latest Ref Rng & Units 07/29/2024    4:37 AM 07/29/2024   12:29 AM 07/28/2024    2:09 AM  CMP  Glucose 70 - 99 mg/dL 887  880  896   BUN 8 - 23 mg/dL 23  25  26    Creatinine 0.44 - 1.00 mg/dL 9.14  8.99  9.11   Sodium 135 - 145 mmol/L 120  122  120   Potassium 3.5 - 5.1 mmol/L 3.4  4.0  3.5   Chloride 98 - 111 mmol/L 86  85  86   CO2 22 - 32 mmol/L 25  26  26    Calcium  8.9 - 10.3 mg/dL 8.6  8.7  8.1   Total Protein 6.5 - 8.1 g/dL  5.9    Total Bilirubin 0.0 - 1.2 mg/dL  0.3    Alkaline Phos 38 - 126 U/L  54    AST 15 - 41 U/L  40    ALT 0 - 44 U/L  34      Estimated Creatinine Clearance: 48.6 mL/min (by C-G formula based on SCr of 0.85 mg/dL).   Assessment plan:  Patient is a 73 year old female whom I follow in clinic scheduled for axillobifemoral bypass today.  She was admitted due to heart failure exacerbation, and has been working toward optimization.  I talked with her hospitalist, Dr. Lue this morning, who also feels like she is in good shape for surgery.   Marshe and I had a long discussion regarding her axillobifemoral bypass.  She will also require bilateral common femoral endarterectomies.  After discussing the risks and benefits of the above, they elected to proceed.  With her recent hospitalization, I think she is at higher risk of healing concerns wound infection.  We discussed the importance of p.o. intake and good nutrition postoperatively.

## 2024-07-29 NOTE — Anesthesia Procedure Notes (Signed)
 Arterial Line Insertion Start/End12/29/2025 9:35 AM, 07/29/2024 9:40 AM Performed by: Zelphia Norleen HERO, CRNA  Patient location: Pre-op. Preanesthetic checklist: patient identified, IV checked, site marked, risks and benefits discussed, surgical consent, monitors and equipment checked, pre-op evaluation, timeout performed and anesthesia consent Lidocaine  1% used for infiltration Left, radial was placed Catheter size: 20 G Hand hygiene performed  and maximum sterile barriers used   Attempts: 1 Procedure performed using ultrasound to evaluate access site. Ultrasound Notes:image(s) printed for medical record. Following insertion, dressing applied and Biopatch. Post procedure assessment: normal and unchanged  Patient tolerated the procedure well with no immediate complications.

## 2024-07-29 NOTE — Progress Notes (Signed)
" ° °  Brief Progress Note   _____________________________________________________________________________________________________________  Patient Name: Pam [name] Patient DOB: [birth year] Date: 07/29/2024, 8:44 pm     Data: Patient with estimated medical readiness date of 07/30/24, notes indicate patient will require rolling walker and tub seat and may also home oxygen prior to discharge; no orders noted.    Action: Clinical Expeditor contacted bedside RN to verify, offering to contact provider for orders if indicated     Response:  Bedside RN replied 07/30/24 at 6:42 am, stating unsure if home O2 will be required, and that pt is not using a walker PTA. Clinical expeditor noted the walker and tub seat are current recommendations by therapy, and require an order, and that expeditor team will follow up to secure orders.  _____________________________________________________________________________________________________________  [facility] RN Expeditor Harlene BRAVO Robert Sperl Please contact us  directly via secure chat (search for [facility]) or by calling us  at [phone] [facility]).  "

## 2024-07-29 NOTE — Progress Notes (Signed)
 [employer] Rep notified. Per MD orders, have magnet available. Dr. Niels aware.

## 2024-07-29 NOTE — Progress Notes (Signed)
 " PROGRESS NOTE    Pam [name]  FMW:[id number] DOB: November 28, 1950 DOA: 07/24/2024 PCP: Andrew Truman GRADE., MD   Brief Narrative:  Pam [name] is a 73 y.o. female with medical history significant of HFrEF - EF 35-40% with grade 1 diastolic dysfunction, ischemic cardiomyopathy s/p ICD placement, CAD, hyperlipidemia, peripheral artery disease, and prior tobacco abuse who presents with leg pain and abnormal lab results in preparation for bypass surgery with Dr. Silver scheduled for lower extremity bypass surgery on 12/29.  Patient noted to be markedly hypoxic in intake concerning for heart failure exacerbation, diuretics initiated at intake with notable hyperkalemia at 5.3.  Assessment & Plan:   Principal Problem:   Heart failure with reduced ejection fraction (HCC) Active Problems:   Acute respiratory failure with hypoxia (HCC)   Elevated troponin   Hyponatremia   Critical limb ischemia of right lower extremity (HCC)   COPD (chronic obstructive pulmonary disease) (HCC)   Essential hypertension   Hypothyroidism   Pressure injury of skin   Acute respiratory failure with hypoxia secondary to heart failure with reduced ejection fraction exacerbation, POA Bilateral pleural effusion Rule out concurrent sleep apnea -Patient does not require oxygen at baseline, continue to wean oxygen as appropriate - Respiratory status appears to be improving - Transition to scheduled p.o. diuretics, discontinue IV given ongoing hypotension - Recent echo earlier this month, no indication to repeat at this time - BNP elevated at 4500 - Recommend sleep study outpatient although family indicates she doesn't snore/have apneic events given nocturnal hypoxia here.   Hyponatremia, hypervolemic Hypochloremia Hypokalemia Complicated by long term diuretic use Acute on chronic, trough 117 at intake, sodium now approaching baseline high 120s/low 130s Potassium supplementation sparingly/when needed during  diuresis  Critical limb ischemia, POA -Bypass graft scheduled 12/29 Dr. Silver, vascular surgery is aware of the patient's admission - Medically stable from my standpoint to undergo axillobifemoral bypass with vascular surgery later today on 12/29 - Plavix  currently on hold per vascular's previous recommendations- continue statin  Elevated troponin -Likely multifactorial, ACS ruled out -Minimally elevated likely due to hypoxia and concurrent heart failure exacerbation - EKG and lack of symptoms reassuring   COPD, without acute exacerbation -Does not appear to be contributing to patient's hypoxia, no wheeze noted -Continue home inhaled medications, no signs or symptoms of infection at this time   Essential hypertension Currently hypotensive - Discontinue continue bisoprolol  and spironolactone  given borderline hypotension - transition back to p.o. diuretics  Hypothyroidism - TSH elevated, recommend repeat outpatient once acute illness is resolved - Continue levothyroxine   DVT prophylaxis: enoxaparin  (LOVENOX ) injection 40 mg Start: 07/24/24 2200 Code Status:   Code Status: Full Code Family Communication: At bedside  Status is: Inpatient  Dispo: The patient is from: Home              Anticipated d/c is to: Home              Anticipated d/c date is: To be determined              Patient currently not medically stable for discharge  Consultants:  Vascular surgery  Procedures:  None currently planned, previously scheduled vascular intervention on 12/29  Antimicrobials:  None indicated  Subjective: No acute issues or events overnight, respiratory status appears to be improving, approaching baseline, otherwise denies nausea vomiting diarrhea constipation any fevers chills or chest pain  Objective: Vitals:   07/28/24 1940 07/29/24 0006 07/29/24 0418 07/29/24 0459  BP: (!) 100/44 (!) 104/48 (!) 105/47  Pulse: 77  83   Resp: 19 19 16    Temp: 97.9 F (36.6 C) 98 F (36.7  C) 98 F (36.7 C)   TempSrc: Temporal Oral Oral   SpO2: 97% 98% 99%   Weight:    52.2 kg  Height:        Intake/Output Summary (Last 24 hours) at 07/29/2024 0733 Last data filed at 07/28/2024 2350 Gross per 24 hour  Intake 726 ml  Output 1350 ml  Net -624 ml   Filed Weights   07/27/24 0500 07/28/24 0500 07/29/24 0459  Weight: 52.2 kg 52.9 kg 52.2 kg    Examination:  General:  Pleasantly resting in bed, No acute distress. HEENT:  Normocephalic atraumatic.  Sclerae nonicteric, noninjected.  Extraocular movements intact bilaterally. Neck:  Without mass or deformity.  Trachea is midline. Lungs: Bibasilar rales. Heart:  Regular rate and rhythm.  Without murmurs, rubs, or gallops. Abdomen:  Soft, nontender, nondistended.  Without guarding or rebound. Extremities: Without cyanosis, clubbing, edema, or obvious deformity. Skin:  Warm and dry, no erythema.  Data Reviewed: I have personally reviewed following labs and imaging studies  CBC: Recent Labs  Lab 07/23/24 0915 07/24/24 1016 07/24/24 1540 07/25/24 0253  WBC 11.4* 10.9* 8.2 12.8*  NEUTROABS  --   --  5.9 10.0*  HGB 9.5* 9.0* 8.3* 8.1*  HCT 27.4* 26.2* 24.0* 23.0*  MCV 98.9 99.2 99.6 97.5  PLT 368 390 379 395   Basic Metabolic Panel: Recent Labs  Lab 07/26/24 0950 07/27/24 0356 07/28/24 0209 07/29/24 0029 07/29/24 0437  NA 120* 120* 120* 122* 120*  K 4.8 3.6 3.5 4.0 3.4*  CL 88* 86* 86* 85* 86*  CO2 24 25 26 26 25   GLUCOSE 125* 102* 103* 119* 112*  BUN 35* 29* 26* 25* 23  CREATININE 1.05* 0.87 0.88 1.00 0.85  CALCIUM  8.7* 8.4* 8.1* 8.7* 8.6*   GFR: Estimated Creatinine Clearance: 48.6 mL/min (by C-G formula based on SCr of 0.85 mg/dL). Liver Function Tests: Recent Labs  Lab 07/23/24 0915 07/24/24 1016 07/29/24 0029  AST 28 56* 40  ALT 16 23 34  ALKPHOS 37* 39 54  BILITOT 0.4 0.4 0.3  PROT 6.4* 6.3* 5.9*  ALBUMIN 4.2 4.1 3.8   No results for input(s): LIPASE, AMYLASE in the last 168  hours. No results for input(s): AMMONIA in the last 168 hours. Coagulation Profile: Recent Labs  Lab 07/23/24 0915 07/24/24 1016  INR 1.0 1.1   Cardiac Enzymes: No results for input(s): CKTOTAL, CKMB, CKMBINDEX, TROPONINI in the last 168 hours. BNP (last 3 results) Recent Labs    07/24/24 1016  PROBNP 4,537.0*   HbA1C: No results for input(s): HGBA1C in the last 72 hours. CBG: Recent Labs  Lab 07/25/24 1159  GLUCAP 95   Lipid Profile: No results for input(s): CHOL, HDL, LDLCALC, TRIG, CHOLHDL, LDLDIRECT in the last 72 hours. Thyroid  Function Tests: No results for input(s): TSH, T4TOTAL, FREET4, T3FREE, THYROIDAB in the last 72 hours.  Anemia Panel: No results for input(s): VITAMINB12, FOLATE, FERRITIN, TIBC, IRON, RETICCTPCT in the last 72 hours. Sepsis Labs: Recent Labs  Lab 07/24/24 1843  PROCALCITON <0.10    Recent Results (from the past 240 hours)  Surgical pcr screen     Status: None   Collection Time: 07/23/24  9:15 AM   Specimen: Nasal Mucosa; Nasal Swab  Result Value Ref Range Status   MRSA, PCR NEGATIVE NEGATIVE Final   Staphylococcus aureus NEGATIVE NEGATIVE Final    Comment: (NOTE)  The Xpert SA Assay (FDA approved for NASAL specimens in patients 66 years of age and older), is one component of a comprehensive surveillance program. It is not intended to diagnose infection nor to guide or monitor treatment. Performed at [facility], [address], [place], [place] [address]   Resp panel by RT-PCR (RSV, Flu A&B, Covid) Anterior Nasal Swab     Status: None   Collection Time: 07/24/24 10:33 AM   Specimen: Anterior Nasal Swab  Result Value Ref Range Status   SARS Coronavirus 2 by RT PCR NEGATIVE NEGATIVE Final   Influenza A by PCR NEGATIVE NEGATIVE Final   Influenza B by PCR NEGATIVE NEGATIVE Final    Comment: (NOTE) The Xpert Xpress SARS-CoV-2/FLU/RSV plus assay is intended as an aid in the  diagnosis of influenza from Nasopharyngeal swab specimens and should not be used as a sole basis for treatment. Nasal washings and aspirates are unacceptable for Xpert Xpress SARS-CoV-2/FLU/RSV testing.  Fact Sheet for Patients: bloggercourse.com  Fact Sheet for Healthcare Providers: seriousbroker.it  This test is not yet approved or cleared by the [place]  FDA and has been authorized for detection and/or diagnosis of SARS-CoV-2 by FDA under an Emergency Use Authorization (EUA). This EUA will remain in effect (meaning this test can be used) for the duration of the COVID-19 declaration under Section 564(b)(1) of the Act, 21 U.S.C. section 360bbb-3(b)(1), unless the authorization is terminated or revoked.     Resp Syncytial Virus by PCR NEGATIVE NEGATIVE Final    Comment: (NOTE) Fact Sheet for Patients: bloggercourse.com  Fact Sheet for Healthcare Providers: seriousbroker.it  This test is not yet approved or cleared by the [place]  FDA and has been authorized for detection and/or diagnosis of SARS-CoV-2 by FDA under an Emergency Use Authorization (EUA). This EUA will remain in effect (meaning this test can be used) for the duration of the COVID-19 declaration under Section 564(b)(1) of the Act, 21 U.S.C. section 360bbb-3(b)(1), unless the authorization is terminated or revoked.  Performed at [facility], [address], [place], [place] [address]          Radiology Studies: No results found.       Scheduled Meds:  atorvastatin   80 mg Oral Daily   digoxin   0.0625 mg Oral Daily   enoxaparin  (LOVENOX ) injection  40 mg Subcutaneous Q24H   furosemide   20 mg Oral BID   levothyroxine   37.5 mcg Oral Q0600   polyethylene glycol  17 g Oral BID   ranolazine   1,000 mg Oral BID   senna-docusate  1 tablet Oral BID   sodium chloride  flush  3 mL Intravenous  Q12H   umeclidinium-vilanterol  1 puff Inhalation Daily   valACYclovir   1,000 mg Oral Daily   Continuous Infusions:  sodium chloride  20 mL (07/29/24 0556)    ceFAZolin  (ANCEF ) IV     potassium chloride         LOS: 5 days   Time spent:  Elsie JAYSON Montclair, DO [facility]  If 7PM-7AM, please contact night-coverage [email or web address]  07/29/2024, 7:33 AM      "

## 2024-07-29 NOTE — Discharge Instructions (Signed)
 [facility]  Discharge instructions  Lower Extremity Bypass Surgery  Please refer to the following instruction for your post-procedure care. Your surgeon or physician assistant will discuss any changes with you.  Activity  You are encouraged to walk as much as you can. You can slowly return to normal activities during the month after your surgery. Avoid strenuous activity and heavy lifting until your doctor tells you it's OK. Avoid activities such as vacuuming or swinging a golf club. Do not drive until your doctor give the OK and you are no longer taking prescription pain medications. It is also normal to have difficulty with sleep habits, eating and bowel movement after surgery. These will go away with time.  Bathing/Showering  Shower daily after you go home. Do not soak in a bathtub, hot tub, or swim until the incision heals completely.  Incision Care  Clean your incision with mild soap and water. Shower every day. Pat the area dry with a clean towel. You do not need a bandage unless otherwise instructed. Do not apply any ointments or creams to your incision. If you have open wounds you will be instructed how to care for them or a visiting nurse may be arranged for you. If you have staples or sutures along your incision they will be removed at your post-op appointment. You may have skin glue on your incision. Do not peel it off. It will come off on its own in about one week.  Wash the groin wound with soap and water daily and pat dry. (No tub bath-only shower)  Then put a dry gauze or washcloth in the groin to keep this area dry to help prevent wound infection.  Do this daily and as needed.  Do not use Vaseline or neosporin on your incisions.  Only use soap and water on your incisions and then protect and keep dry.  Diet  Resume your normal diet. There are no special food restrictions following this procedure. A low fat/ low cholesterol diet is  recommended for all patients with vascular disease. In order to heal from your surgery, it is CRITICAL to get adequate nutrition. Your body requires vitamins, minerals, and protein. Vegetables are the best source of vitamins and minerals. Vegetables also provide the perfect balance of protein. Processed food has little nutritional value, so try to avoid this.  Medications  Resume taking all your medications unless your doctor or physician assistant tells you not to. If your incision is causing pain, you may take over-the-counter pain relievers such as acetaminophen  (Tylenol ). If you were prescribed a stronger pain medication, please aware these medication can cause nausea and constipation. Prevent nausea by taking the medication with a snack or meal. Avoid constipation by drinking plenty of fluids and eating foods with high amount of fiber, such as fruits, vegetables, and grains. Take Colace 100 mg (an over-the-counter stool softener) twice a day as needed for constipation.  Do not take Tylenol  if you are taking prescription pain medications.  Follow Up  Our office will schedule a follow up appointment 2-3 weeks following discharge.  Please call us  immediately for any of the following conditions  Severe or worsening pain in your legs or feet while at rest or while walking Increase pain, redness, warmth, or drainage (pus) from your incision site(s) Fever of 101 degree or higher The swelling in your leg with the bypass suddenly worsens and becomes more painful than when you were in the hospital If you have  been instructed to feel your graft pulse then you should do so every day. If you can no longer feel this pulse, call the office immediately. Not all patients are given this instruction.  Leg swelling is common after leg bypass surgery.  The swelling should improve over a few months following surgery. To improve the swelling, you may elevate your legs above the level of your heart while you are  sitting or resting. Your surgeon or physician assistant may ask you to apply an ACE wrap or wear compression (TED) stockings to help to reduce swelling.  Reduce your risk of vascular disease  Stop smoking. If you would like help call [facility] at 1-800-QUIT-NOW (609-485-9870) or [facility] at 417-855-7064.  Manage your cholesterol Maintain a desired weight Control your diabetes weight Control your diabetes Keep your blood pressure down  If you have any questions, please call the office at [phone]

## 2024-07-29 NOTE — Progress Notes (Signed)
 Patient received from PACU, AO x4. CHG completed, connected to tele and CCMD notified. Oriented patient to room and call bell system. Call bell within reach. Plan of care continues.

## 2024-07-29 NOTE — Op Note (Addendum)
 "   NAME: Pam [name]    MRN: [id number] DOB: 1951-07-18    DATE OF OPERATION: 07/29/2024  PREOP DIAGNOSIS:    Bilateral lower extremity critical ischemia with rest pain  POSTOP DIAGNOSIS:    Same  PROCEDURE:    Right sided axillobifemoral bypass using 8 mm ringed PTFE Right common femoral endarterectomy  SURGEON: Fonda FORBES Rim Co-surgeon: Dr. Debby Robertson ASSIST: Lucie Apt, PA  ANESTHESIA: General  EBL: 250 mL  INDICATIONS:    Pam [name] is a 73 y.o. female with history of bilateral lower extremity critical limb ischemia with rest pain.  She was admitted to the hospital with CHF exacerbation last week.  She was seen in the office previously, with scheduled elective axillobifemoral bypass graft scheduled for today.  I spoke to the hospitalist involved in her care.  He felt like she was optimized.  After discussing nursing notes, she elected to proceed.  FINDINGS:   6 mm axillary artery Common femoral artery with severe disease on the right requiring endarterectomy.   TECHNIQUE:   Patient brought to the OR laid in supine position.  General anesthesia was induced and the patient was prepped draped in standard fashion.  The case began with ultrasound and sedation of bilateral common femoral arteries.  There was focal disease in the left common femoral artery, severe disease in the right common femoral artery.  Bifurcations were marked.  An oblique incision was made in the left groin, longitudinal incision of the right, and a infraclavicular incision.  While I worked to expose the axillary artery in standard fashion, Dr. Robertson worked exposing bilateral common femoral arteries and their branches. Special attention was taken to ensure that nerves were not ligated at the level of the shoulder.  The pectoralis minor muscle was identified and mobilized.  Next, the tunneler was brought to the field a tunneler was driven from the right axillary incision under the pectoralis  minor muscle down the length of the trunk with careful attention to ensure that the tumor did not pass intraperitoneal.  It was then directed medially to the anterior superior iliac spine and driven out the right common femoral artery incision.  A prefabricated 8 mm axillobifemoral Gore PTFE graft was brought onto the field and the graft was run through the tunneler to the axillary space from the groin.  Dr. Robertson then worked to make the femoral-femoral bypass tunnel using blunt dissection.  This was immediately anterior to the inguinal ligaments and abdominal wall.  Next, the graft was tunneled from right to left.  The patient was then heparinized.  Dr. Robertson began working the left groin, while I worked in the right infraclavicular space.  The axillary artery was clamped proximally and distally and opened using an 11 blade.  Inferiorly, there appeared to be intimal disruption, therefore I elected to place a 6 oh tacking stitch.  Next, I used a 5 mm hole punch and made an adequate arteriotomy for the 8 mm bypass graft.  The bypass graft was then beveled and sewn in end-to-side fashion using running 6-0 Prolene suture.  See Dr. Cleda note for left groin bypass graft anastomosis. As I finished the axillary anastomosis, Dr. Robertson began working in the right groin.  He made a large longitudinal arteriotomy and began endarterectomy.  Once I was finished, I moved to help him with the endarterectomy.  The graft was cut to length, beveled considerably and the hood of the bypass graft was used as the endarterectomy patch.  This was sewn in end-to-side fashion using running 6-0 Prolene suture.  Prior to completion the bypass graft was backbled.  On completion, the patient had a 30 point systolic blood pressure difference between the left and right arms due to the new bypass graft.  An ultrasound was brought onto the field and there was an excellent, triphasic signal distal to the graft and the axillary artery, as  well as distal to the anastomosis and bilateral groins.  Distally, there were signals in the feet bilaterally.   Heparin  was reversed with use of protamine .  Incisions were irrigated with copious amounts of saline and closed in layers using running 2-0 Vicryl suture with Monocryl and Dermabond at the level of the skin in both the right infraclavicular incision and left groin.  The longitudinal incision in the right groin was closed with staples and a Prevena vacuum dressing to promote wound healing as I am concerned with her poor nutrition, she is at risk for wound breakdown.   Fonda FORBES Rim, MD [facility] DATE OF DICTATION:   07/29/2024  "

## 2024-07-29 NOTE — Op Note (Signed)
 DATE OF SERVICE: 07/29/2024  PATIENT:  Pam [name]  73 y.o. female  PRE-OPERATIVE DIAGNOSIS:  Bilateral lower extremity critical ischemia with rest pain   POST-OPERATIVE DIAGNOSIS:  Same  PROCEDURE:   Right sided axillobifemoral bypass using 8 mm ringed PTFE Right common femoral endarterectomy   COSURGEONS:      DEWAINE Rim, Fonda BRAVO, MD     * Magda Debby SAILOR, MD  ANESTHESIA:   general  EBL:  BLOOD ADMINISTERED:none  DRAINS: none   LOCAL MEDICATIONS USED:  NONE  SPECIMEN:  none  COUNTS: confirmed correct.  TOURNIQUET:  none  PATIENT DISPOSITION:  PACU - hemodynamically stable.   Delay start of Pharmacological VTE agent (>24hrs) due to surgical blood loss or risk of bleeding: no  INDICATION FOR PROCEDURE: Pam [name] is a 73 y.o. female with bilateral lower extremity ischemic rest pain. After careful discussion of risks, benefits, and alternatives the patient was offered axbifem bypass. The patient understood and wished to proceed.  OPERATIVE FINDINGS: healthy right axillary artery (see Dr. Rim note for detail); left common femoral artery healthier than expected - no endarterectomy   DESCRIPTION OF PROCEDURE: After identification of the patient in the pre-operative holding area, the patient was transferred to the operating room. The patient was positioned supine on the operating room table. Anesthesia was induced. The right chest, right flank and bilateral groins were prepped and draped in standard fashion. A surgical pause was performed confirming correct patient, procedure, and operative location.  Dr. Rim performed the case together.  See his note for any details missing from my note.  Dr. Rim began by exposing right axillary artery.  I started in the left groin and made a oblique incision over the course of the common femoral artery.  The incision was carried down through subtenons tissue until the femoral sheath was encountered.  The femoral sheath was  incised longitudinally.  Common femoral artery and its bifurcation were carefully skeletonized using sharp and Bovie dissection.  Once the arteries were exposed, the arteries were encircled with Silastic vessel loop.  Next a moved to the right groin and made a longitudinal incision in the groin anticipating a common femoral endarterectomy.  Incision was carried out through subcutaneous tissue until the femoral sheath was encountered.  The femoral sheath was opened longitudinally.  The common femoral artery was skeletonized from the external iliac artery to the common femoral artery bifurcation.  The arteries were encircled with Silastic vessel loop for control.  The inguinal ligaments were identified bilaterally.  A subcutaneous tunnel was created over the inguinal ligaments connecting in a soft arc over the suprapubic abdomen.  Umbilical tape was delivered through the tunnel to hold the position for use in delivering the graft.  Dr. Rim created a subcutaneous tunnel connecting the right axillary incision to the right common femoral exposure.  The patient was systemically heparinized.  Activated clotting time measurements were used at the case confirm adequate anticoagulation.  A premade axillobifemoral bypass graft measuring 8 mm in diameter was delivered through the tunnels.  The graft was laid to sit without undue to redundancy or tension over the femoral and axillary anastomosis.  I then performed the left femoral anastomosis.  Clamps were applied to the common femoral artery, profunda femoris artery, and superficial femoral artery on the left.  An anterior arteriotomy was made in the left common femoral artery and extended with Potts scissors.  The vascular graft was spatulated to allow end to side anastomosis to the arteriotomy.  The anastomosis was then performed with continuous running suture of 6-0 Prolene.  Prior to completion anastomosis was flushed and de-aired.  The anastomosis was  completed.  Clamps were released.  Hemostasis was confirmed.  Attention was turned to the right groin.  Clamps were applied to the right external iliac artery, profunda femoris artery, and superficial femoral artery.  The common femoral artery was incised with 11 blade.  The incision was extended with Potts scissors.  Endarterectomy was performed with a Astronomer.  Good endarterectomy was achieved with good feathering at the endpoints.  The right limb of the axillobifemoral graft was spatulated to allow long anastomosis to the long arteriotomy.  The anastomosis was performed using continuous running suture of 6-0 Prolene.  Prior to completion the anastomosis was flushed and de-aired.  The anastomosis was completed.  A repair stitch was needed.  Hemostasis was then achieved.  The repair was evaluated with Doppler machine.  Excellent Doppler flow was heard in the femoral vessels bilaterally.  Doppler flow was confirmed in the feet by the nursing staff.  Heparin  was reversed with protamine .  Hemostasis was again confirmed in the vascular beds and anastomoses.  The wounds were closed in layers using 2-0 Vicryl, 3-0 Vicryl.  A 4-0 Monocryl was applied to the left groin.  Upon completion of the case instrument and sharps counts were confirmed correct. The patient was transferred to the PACU in good condition. I was present for all portions of the procedure.  Debby SAILOR. Magda, MD [facility] Phone Number: [phone] 07/29/2024 1:13 PM

## 2024-07-29 NOTE — Transfer of Care (Signed)
 Immediate Anesthesia Transfer of Care Note  Patient: Pam [name]  Procedure(s) Performed: CREATION, BYPASS, ARTERIAL, AXILLARY TO BILATERAL FEMORAL, USING GRAFT (Bilateral) ENDARTERECTOMY, FEMORAL (Bilateral: Groin)  Patient Location: PACU  Anesthesia Type:General  Level of Consciousness: drowsy and patient cooperative  Airway & Oxygen Therapy: Patient connected to face mask oxygen  Post-op Assessment: Report given to RN, Post -op Vital signs reviewed and stable, and Patient moving all extremities X 4  Post vital signs: Reviewed and stable  Last Vitals:  Vitals Value Taken Time  BP 107/44 07/29/24 13:03  Temp    Pulse 66 07/29/24 13:07  Resp 13 07/29/24 13:06  SpO2 93 % 07/29/24 13:07  Vitals shown include unfiled device data.  Last Pain:  Vitals:   07/29/24 0855  TempSrc:   PainSc: 4          Complications: There were no known notable events for this encounter.

## 2024-07-29 NOTE — Anesthesia Preprocedure Evaluation (Addendum)
 "                                  Anesthesia Evaluation  Patient identified by MRN, date of birth, ID band Patient awake    Reviewed: Allergy & Precautions, NPO status , Patient's Chart, lab work & pertinent test results  Airway Mallampati: II  TM Distance: >3 FB Neck ROM: Full    Dental  (+) Edentulous Upper, Edentulous Lower, Dental Advisory Given   Pulmonary COPD, former smoker   Pulmonary exam normal breath sounds clear to auscultation       Cardiovascular hypertension, + angina  + CAD, + Past MI, + Peripheral Vascular Disease and +CHF  Normal cardiovascular exam+ Cardiac Defibrillator  Rhythm:Regular Rate:Normal     Neuro/Psych negative neurological ROS  negative psych ROS   GI/Hepatic negative GI ROS, Neg liver ROS,,,  Endo/Other  Hypothyroidism    Renal/GU negative Renal ROS  negative genitourinary   Musculoskeletal negative musculoskeletal ROS (+)    Abdominal   Peds  Hematology  (+) Blood dyscrasia, anemia Lab Results      Component                Value               Date                      WBC                      12.8 (H)            07/25/2024                HGB                      8.1 (L)             07/25/2024                HCT                      23.0 (L)            07/25/2024                MCV                      97.5                07/25/2024                PLT                      395                 07/25/2024              Anesthesia Other Findings   Reproductive/Obstetrics                              Anesthesia Physical Anesthesia Plan  ASA: 3  Anesthesia Plan: General   Post-op Pain Management: Ofirmev  IV (intra-op)*   Induction: Intravenous  PONV Risk Score and Plan: 3 and Dexamethasone , Ondansetron  and Treatment may vary due to age or medical condition  Airway Management Planned: Oral ETT  Additional Equipment: Arterial line  Intra-op Plan:   Post-operative Plan: Extubation  in OR  Informed Consent: I have reviewed the patients History and Physical, chart, labs and discussed the procedure including the risks, benefits and alternatives for the proposed anesthesia with the patient or authorized representative who has indicated his/her understanding and acceptance.     Dental advisory given  Plan Discussed with: CRNA  Anesthesia Plan Comments:         Anesthesia Quick Evaluation  "

## 2024-07-30 ENCOUNTER — Encounter (HOSPITAL_COMMUNITY): Payer: Self-pay | Admitting: Vascular Surgery

## 2024-07-30 DIAGNOSIS — Z95828 Presence of other vascular implants and grafts: Secondary | ICD-10-CM

## 2024-07-30 DIAGNOSIS — I5020 Unspecified systolic (congestive) heart failure: Secondary | ICD-10-CM | POA: Diagnosis not present

## 2024-07-30 LAB — BASIC METABOLIC PANEL WITH GFR
Anion gap: 9 (ref 5–15)
BUN: 22 mg/dL (ref 8–23)
CO2: 24 mmol/L (ref 22–32)
Calcium: 8.1 mg/dL — ABNORMAL LOW (ref 8.9–10.3)
Chloride: 89 mmol/L — ABNORMAL LOW (ref 98–111)
Creatinine, Ser: 0.9 mg/dL (ref 0.44–1.00)
GFR, Estimated: >60 mL/min
Glucose, Bld: 133 mg/dL — ABNORMAL HIGH (ref 70–99)
Potassium: 4.6 mmol/L (ref 3.5–5.1)
Sodium: 122 mmol/L — ABNORMAL LOW (ref 135–145)

## 2024-07-30 LAB — CBC
HCT: 18.7 % — ABNORMAL LOW (ref 36.0–46.0)
Hemoglobin: 6.5 g/dL — CL (ref 12.0–15.0)
MCH: 33.2 pg (ref 26.0–34.0)
MCHC: 34.8 g/dL (ref 30.0–36.0)
MCV: 95.4 fL (ref 80.0–100.0)
Platelets: 421 K/uL — ABNORMAL HIGH (ref 150–400)
RBC: 1.96 MIL/uL — ABNORMAL LOW (ref 3.87–5.11)
RDW: 14.5 % (ref 11.5–15.5)
WBC: 11.7 K/uL — ABNORMAL HIGH (ref 4.0–10.5)
nRBC: 0.3 % — ABNORMAL HIGH (ref 0.0–0.2)

## 2024-07-30 LAB — LIPID PANEL
Cholesterol: 86 mg/dL (ref 0–200)
HDL: 41 mg/dL
LDL Cholesterol: 35 mg/dL (ref 0–99)
Total CHOL/HDL Ratio: 2.1 ratio
Triglycerides: 50 mg/dL
VLDL: 10 mg/dL (ref 0–40)

## 2024-07-30 LAB — POCT I-STAT 7, (LYTES, BLD GAS, ICA,H+H)
Acid-Base Excess: 0 mmol/L (ref 0.0–2.0)
Bicarbonate: 26.8 mmol/L (ref 20.0–28.0)
Calcium, Ion: 1.12 mmol/L — ABNORMAL LOW (ref 1.15–1.40)
HCT: 34 % — ABNORMAL LOW (ref 36.0–46.0)
Hemoglobin: 11.6 g/dL — ABNORMAL LOW (ref 12.0–15.0)
O2 Saturation: 98 %
Patient temperature: 35.9
Potassium: 3.4 mmol/L — ABNORMAL LOW (ref 3.5–5.1)
Sodium: 124 mmol/L — ABNORMAL LOW (ref 135–145)
TCO2: 28 mmol/L (ref 22–32)
pCO2 arterial: 52.2 mmHg — ABNORMAL HIGH (ref 32–48)
pH, Arterial: 7.313 — ABNORMAL LOW (ref 7.35–7.45)
pO2, Arterial: 111 mmHg — ABNORMAL HIGH (ref 83–108)

## 2024-07-30 LAB — PREPARE RBC (CROSSMATCH)

## 2024-07-30 LAB — POCT ACTIVATED CLOTTING TIME
Activated Clotting Time: 230 s
Activated Clotting Time: 230 s

## 2024-07-30 MED ORDER — SODIUM CHLORIDE 0.9% IV SOLUTION: Freq: Once | INTRAVENOUS | Status: AC

## 2024-07-30 MED ORDER — MIDODRINE HCL 5 MG PO TABS
5.0000 mg | ORAL_TABLET | Freq: Three times a day (TID) | ORAL | Status: DC
  Administered 2024-07-30 (×2): 5 mg via ORAL
  Filled 2024-07-30 (×2): qty 1

## 2024-07-30 MED ORDER — BOOST / RESOURCE BREEZE PO LIQD CUSTOM
237.0000 mL | Freq: Three times a day (TID) | ORAL | Status: DC
  Administered 2024-07-30: 1 via ORAL
  Administered 2024-07-30: 237 mL via ORAL
  Administered 2024-07-30 – 2024-08-05 (×10): 1 via ORAL

## 2024-07-30 NOTE — Anesthesia Postprocedure Evaluation (Signed)
"   Anesthesia Post Note  Patient: Pam [name]  Procedure(s) Performed: CREATION, BYPASS, ARTERIAL, AXILLARY TO BILATERAL FEMORAL, USING GRAFT (Bilateral) ENDARTERECTOMY, FEMORAL (Bilateral: Groin)     Patient location during evaluation: PACU Anesthesia Type: General Level of consciousness: awake and alert Pain management: pain level controlled Vital Signs Assessment: post-procedure vital signs reviewed and stable Respiratory status: spontaneous breathing, nonlabored ventilation, respiratory function stable and patient connected to nasal cannula oxygen Cardiovascular status: blood pressure returned to baseline and stable Postop Assessment: no apparent nausea or vomiting Anesthetic complications: no   There were no known notable events for this encounter.  Last Vitals:  Vitals:   07/30/24 0917 07/30/24 1040  BP:  (!) 103/43  Pulse: 83 82  Resp: 19 17  Temp:  36.6 C  SpO2: 95%     Last Pain:  Vitals:   07/30/24 1208  TempSrc:   PainSc: 6                  Anees Vanecek L Truman Aceituno      "

## 2024-07-30 NOTE — Evaluation (Signed)
 Physical Therapy Re-Evaluation Patient Details Name: Pam [name] MRN: [id number] DOB: 1950/11/23 Today's Date: 07/30/2024  History of Present Illness  Pt is a 73 y.o female admitted 12/24 for SOB and bil LE edema. Chest x-ray showed pulmonary edema. Pt is scheduled for bypass surgery on 12/29. PMH: CAD, CHF, PVD, ICD, HLD, PAD, NSTEMI, cataracts  Clinical Impression  Pt limited in safe mobility by pain in groin, R LE pain in foot and LE, and low BP. Pt only agreeable to bed level exercise, stating she had been out of bed with OT earlier and she is very tired and painful. PT will follow back in another day to perform gait training.       If plan is discharge home, recommend the following: A little help with walking and/or transfers;A little help with bathing/dressing/bathroom;Assist for transportation;Help with stairs or ramp for entrance   Can travel by private vehicle    Maybe    Equipment Recommendations Rolling walker (2 wheels)     Functional Status Assessment Patient has had a recent decline in their functional status and demonstrates the ability to make significant improvements in function in a reasonable and predictable amount of time.     Precautions / Restrictions Precautions Precautions: Fall Recall of Precautions/Restrictions: Intact Precaution/Restrictions Comments: watch o2 Restrictions Weight Bearing Restrictions Per Provider Order: No      Mobility  Bed Mobility               General bed mobility comments: pt refusing OOB due to groin pain           Balance Overall balance assessment: Needs assistance Sitting-balance support: No upper extremity supported, Feet supported Sitting balance-Leahy Scale: Fair Sitting balance - Comments: seated EOB   Standing balance support: Bilateral upper extremity supported, During functional activity, Reliant on assistive device for balance Standing balance-Leahy Scale: Poor Standing balance comment: reliant on  external support for stability                             Pertinent Vitals/Pain Pain Assessment Pain Assessment: 0-10 Pain Score: 7  Pain Location: RLE, abdomen Pain Descriptors / Indicators: Discomfort, Grimacing, Heaviness, Sore Pain Intervention(s): Limited activity within patient's tolerance, Monitored during session    Home Living Family/patient expects to be discharged to:: Private residence Living Arrangements: Spouse/significant other Available Help at Discharge: Family;Available 24 hours/day Type of Home: House Home Access: Stairs to enter Entrance Stairs-Rails: None Entrance Stairs-Number of Steps: 1   Home Layout: One level Home Equipment: None      Prior Function Prior Level of Function : Independent/Modified Independent             Mobility Comments: Ind, denies falls ADLs Comments: Ind     Extremity/Trunk Assessment   Upper Extremity Assessment Upper Extremity Assessment: Defer to OT evaluation    Lower Extremity Assessment Lower Extremity Assessment: RLE deficits/detail;LLE deficits/detail RLE Deficits / Details: hip and knee ROM in bed limited by groin/abdomen pain . Ankle ROM WFL LLE Deficits / Details: hip and knee ROM in bed limited by groin/abdomen pain .Ankle ROM WFL    Cervical / Trunk Assessment Cervical / Trunk Assessment: Normal  Communication   Communication Communication: Impaired Factors Affecting Communication: Hearing impaired    Cognition Arousal: Alert Behavior During Therapy: WFL for tasks assessed/performed   PT - Cognitive impairments: No apparent impairments  Following commands: Intact       Cueing Cueing Techniques: Verbal cues     General Comments General comments (skin integrity, edema, etc.): BP 90/40 in supine, SpO2 on 5L 92%O2, drops to 87% when talking and not using purse lip breathing        Assessment/Plan    PT Assessment Patient needs continued PT  services  PT Problem List Decreased strength;Decreased range of motion;Decreased activity tolerance;Decreased balance;Decreased mobility;Decreased coordination;Decreased knowledge of use of DME;Cardiopulmonary status limiting activity;Pain       PT Treatment Interventions DME instruction;Gait training;Stair training;Functional mobility training;Therapeutic activities;Therapeutic exercise;Balance training;Cognitive remediation;Patient/family education;Wheelchair mobility training;Manual techniques    PT Goals (Current goals can be found in the Care Plan section)  Acute Rehab PT Goals Patient Stated Goal: to feel better PT Goal Formulation: With patient/family Time For Goal Achievement: 08/09/24 Potential to Achieve Goals: Good    Frequency Min 2X/week        AM-PAC PT 6 Clicks Mobility  Outcome Measure Help needed turning from your back to your side while in a flat bed without using bedrails?: A Little Help needed moving from lying on your back to sitting on the side of a flat bed without using bedrails?: A Little Help needed moving to and from a bed to a chair (including a wheelchair)?: A Little Help needed standing up from a chair using your arms (e.g., wheelchair or bedside chair)?: A Little Help needed to walk in hospital room?: A Little Help needed climbing 3-5 steps with a railing? : Total 6 Click Score: 16    End of Session Equipment Utilized During Treatment: Oxygen Activity Tolerance: Patient tolerated treatment well Patient left: in bed;with call bell/phone within reach;with bed alarm set;with family/visitor present Nurse Communication: Mobility status;Other (comment) (oxygen) PT Visit Diagnosis: Other abnormalities of gait and mobility (R26.89);Muscle weakness (generalized) (M62.81);Pain;Difficulty in walking, not elsewhere classified (R26.2) Pain - part of body: Leg;Ankle and joints of foot (abdo,em/groin)    Time: 8448-8397 PT Time Calculation (min) (ACUTE ONLY):  11 min   Charges:   PT Evaluation $PT Re-evaluation: 1 Re-eval   PT General Charges $$ ACUTE PT VISIT: 1 Visit         Emanuell Morina B. Fleeta Lapidus PT, DPT Acute Rehabilitation Services Please use secure chat or  Call Office [phone]   Pam [name] 07/30/2024, 4:09 PM

## 2024-07-30 NOTE — Progress Notes (Signed)
 " PROGRESS NOTE    Pam [name]  FMW:[id number] DOB: 11/17/1950 DOA: 07/24/2024 PCP: Andrew Truman GRADE., MD   Brief Narrative:  Pam [name] is a 73 y.o. female with medical history significant of HFrEF - EF 35-40% with grade 1 diastolic dysfunction, ischemic cardiomyopathy s/p ICD placement, CAD, hyperlipidemia, peripheral artery disease, and prior tobacco abuse who presents with leg pain and abnormal lab results in preparation for bypass surgery with Dr. Silver scheduled for lower extremity bypass surgery on 12/29.  Patient noted to be markedly hypoxic in intake concerning for heart failure exacerbation, diuretics initiated at intake with notable hyperkalemia at 5.3.  Assessment & Plan:   Principal Problem:   Heart failure with reduced ejection fraction (HCC) Active Problems:   Acute respiratory failure with hypoxia (HCC)   Elevated troponin   Hyponatremia   Critical limb ischemia of right lower extremity (HCC)   COPD (chronic obstructive pulmonary disease) (HCC)   Essential hypertension   Hypothyroidism   Pressure injury of skin   Limb ischemia   Acute respiratory failure with hypoxia secondary to heart failure with reduced ejection fraction exacerbation, POA Bilateral pleural effusion Rule out concurrent sleep apnea -Patient does not require oxygen at baseline, continue to wean oxygen as appropriate - Respiratory status appears to be improving - Transition to scheduled p.o. diuretics, discontinue IV given ongoing hypotension - Recent echo earlier this month, no indication to repeat at this time - BNP elevated at 4500 - Recommend sleep study outpatient although family indicates she doesn't snore/have apneic events given nocturnal hypoxia here.   Currently borderline hypotensive History of essential hypertension - Discontinue continue bisoprolol  and spironolactone  given borderline hypotension - transition back to p.o. diuretics - Start midodrine  TID  Hyponatremia,  hypervolemic Hypochloremia Hypokalemia Complicated by long term diuretic use Acute on chronic, trough 117 at intake, sodium now approaching baseline high 120s/low 130s Potassium supplementation sparingly/when needed during diuresis  Critical limb ischemia, POA -Bypass graft scheduled 12/29 Dr. Silver, vascular surgery is aware of the patient's admission - Right sided axillobifemoral bypass using 8 mm ringed PTFE Right common femoral endarterectomy - Plavix  currently on hold per vascular's previous recommendations; resume once approved - continue statin  Elevated troponin -Likely multifactorial, ACS ruled out -Minimally elevated likely due to hypoxia and concurrent heart failure exacerbation - EKG and lack of symptoms reassuring   COPD, without acute exacerbation -Does not appear to be contributing to patient's hypoxia, no wheeze noted -Continue home inhaled medications, no signs or symptoms of infection at this time   Hypothyroidism - TSH elevated, recommend repeat outpatient once acute illness is resolved - Continue levothyroxine   DVT prophylaxis: enoxaparin  (LOVENOX ) injection 40 mg Start: 07/30/24 1000 SCD's Start: 07/29/24 1604 Code Status:   Code Status: Full Code Family Communication: At bedside  Status is: Inpatient  Dispo: The patient is from: Home              Anticipated d/c is to: TBD              Anticipated d/c date is: TBD              Patient currently not medically stable for discharge  Consultants:  Vascular surgery  Procedures:  None currently planned, previously scheduled vascular intervention on 12/29  Antimicrobials:  None indicated  Subjective: No acute issues or events overnight, respiratory status appears to be improving, approaching baseline, otherwise denies nausea vomiting diarrhea constipation any fevers chills or chest pain  Objective: Vitals:   07/29/24 1900  07/29/24 2300 07/30/24 0000 07/30/24 0400  BP: (!) 116/51 (!) 114/51  (!)  112/47  Pulse: 84 81  82  Resp: 16 13 15 14   Temp:  98.8 F (37.1 C)  98.6 F (37 C)  TempSrc:  Oral  Oral  SpO2: 99% 99%  100%  Weight:    56.8 kg  Height:        Intake/Output Summary (Last 24 hours) at 07/30/2024 0714 Last data filed at 07/30/2024 0602 Gross per 24 hour  Intake 2340 ml  Output 1150 ml  Net 1190 ml   Filed Weights   07/28/24 0500 07/29/24 0459 07/30/24 0400  Weight: 52.9 kg 52.2 kg 56.8 kg    Examination:  General:  Pleasantly resting in bed, No acute distress. HEENT:  Normocephalic atraumatic.  Sclerae nonicteric, noninjected.  Extraocular movements intact bilaterally. Neck:  Without mass or deformity.  Trachea is midline. Lungs: Bibasilar rales. Heart:  Regular rate and rhythm.  Without murmurs, rubs, or gallops. Abdomen:  Soft, nontender, nondistended.  Without guarding or rebound. Extremities: Without cyanosis, clubbing, edema, or obvious deformity. Skin:  Warm and dry, no erythema.  Data Reviewed: I have personally reviewed following labs and imaging studies  CBC: Recent Labs  Lab 07/23/24 0915 07/24/24 1016 07/24/24 1540 07/25/24 0253 07/30/24 0515  WBC 11.4* 10.9* 8.2 12.8* 11.7*  NEUTROABS  --   --  5.9 10.0*  --   HGB 9.5* 9.0* 8.3* 8.1* 6.5*  HCT 27.4* 26.2* 24.0* 23.0* 18.7*  MCV 98.9 99.2 99.6 97.5 95.4  PLT 368 390 379 395 421*   Basic Metabolic Panel: Recent Labs  Lab 07/27/24 0356 07/28/24 0209 07/29/24 0029 07/29/24 0437 07/30/24 0515  NA 120* 120* 122* 120* 122*  K 3.6 3.5 4.0 3.4* 4.6  CL 86* 86* 85* 86* 89*  CO2 25 26 26 25 24   GLUCOSE 102* 103* 119* 112* 133*  BUN 29* 26* 25* 23 22  CREATININE 0.87 0.88 1.00 0.85 0.90  CALCIUM  8.4* 8.1* 8.7* 8.6* 8.1*   GFR: Estimated Creatinine Clearance: 46.1 mL/min (by C-G formula based on SCr of 0.9 mg/dL). Liver Function Tests: Recent Labs  Lab 07/23/24 0915 07/24/24 1016 07/29/24 0029  AST 28 56* 40  ALT 16 23 34  ALKPHOS 37* 39 54  BILITOT 0.4 0.4 0.3  PROT  6.4* 6.3* 5.9*  ALBUMIN 4.2 4.1 3.8   No results for input(s): LIPASE, AMYLASE in the last 168 hours. No results for input(s): AMMONIA in the last 168 hours. Coagulation Profile: Recent Labs  Lab 07/23/24 0915 07/24/24 1016  INR 1.0 1.1   Cardiac Enzymes: No results for input(s): CKTOTAL, CKMB, CKMBINDEX, TROPONINI in the last 168 hours. BNP (last 3 results) Recent Labs    07/24/24 1016  PROBNP 4,537.0*   HbA1C: No results for input(s): HGBA1C in the last 72 hours. CBG: Recent Labs  Lab 07/25/24 1159  GLUCAP 95   Lipid Profile: Recent Labs    07/30/24 0515  CHOL 86  HDL 41  LDLCALC 35  TRIG 50  CHOLHDL 2.1   Thyroid  Function Tests: No results for input(s): TSH, T4TOTAL, FREET4, T3FREE, THYROIDAB in the last 72 hours.  Anemia Panel: No results for input(s): VITAMINB12, FOLATE, FERRITIN, TIBC, IRON, RETICCTPCT in the last 72 hours. Sepsis Labs: Recent Labs  Lab 07/24/24 1843  PROCALCITON <0.10    Recent Results (from the past 240 hours)  Surgical pcr screen     Status: None   Collection Time: 07/23/24  9:15 AM  Specimen: Nasal Mucosa; Nasal Swab  Result Value Ref Range Status   MRSA, PCR NEGATIVE NEGATIVE Final   Staphylococcus aureus NEGATIVE NEGATIVE Final    Comment: (NOTE) The Xpert SA Assay (FDA approved for NASAL specimens in patients 5 years of age and older), is one component of a comprehensive surveillance program. It is not intended to diagnose infection nor to guide or monitor treatment. Performed at [facility], [address] [place], [place] [address]   Resp panel by RT-PCR (RSV, Flu A&B, Covid) Anterior Nasal Swab     Status: None   Collection Time: 07/24/24 10:33 AM   Specimen: Anterior Nasal Swab  Result Value Ref Range Status   SARS Coronavirus 2 by RT PCR NEGATIVE NEGATIVE Final   Influenza A by PCR NEGATIVE NEGATIVE Final   Influenza B by PCR NEGATIVE NEGATIVE Final    Comment:  (NOTE) The Xpert Xpress SARS-CoV-2/FLU/RSV plus assay is intended as an aid in the diagnosis of influenza from Nasopharyngeal swab specimens and should not be used as a sole basis for treatment. Nasal washings and aspirates are unacceptable for Xpert Xpress SARS-CoV-2/FLU/RSV testing.  Fact Sheet for Patients: bloggercourse.com  Fact Sheet for Healthcare Providers: seriousbroker.it  This test is not yet approved or cleared by the [place]  FDA and has been authorized for detection and/or diagnosis of SARS-CoV-2 by FDA under an Emergency Use Authorization (EUA). This EUA will remain in effect (meaning this test can be used) for the duration of the COVID-19 declaration under Section 564(b)(1) of the Act, 21 U.S.C. section 360bbb-3(b)(1), unless the authorization is terminated or revoked.     Resp Syncytial Virus by PCR NEGATIVE NEGATIVE Final    Comment: (NOTE) Fact Sheet for Patients: bloggercourse.com  Fact Sheet for Healthcare Providers: seriousbroker.it  This test is not yet approved or cleared by the [place]  FDA and has been authorized for detection and/or diagnosis of SARS-CoV-2 by FDA under an Emergency Use Authorization (EUA). This EUA will remain in effect (meaning this test can be used) for the duration of the COVID-19 declaration under Section 564(b)(1) of the Act, 21 U.S.C. section 360bbb-3(b)(1), unless the authorization is terminated or revoked.  Performed at [facility], [address], [place], [place] [address]          Radiology Studies: No results found.       Scheduled Meds:  sodium chloride    Intravenous Once   aspirin  EC  81 mg Oral Q0600   atorvastatin   80 mg Oral Daily   clopidogrel   75 mg Oral Q0600   digoxin   0.0625 mg Oral Daily   enoxaparin  (LOVENOX ) injection  40 mg Subcutaneous Q24H   furosemide   20 mg Oral BID    levothyroxine   37.5 mcg Oral Q0600   polyethylene glycol  17 g Oral BID   ranolazine   1,000 mg Oral BID   senna-docusate  1 tablet Oral BID   sodium chloride  flush  3 mL Intravenous Q12H   umeclidinium-vilanterol  1 puff Inhalation Daily   valACYclovir   1,000 mg Oral Daily   Continuous Infusions:  sodium chloride         LOS: 6 days   Time spent:  Elsie JAYSON Montclair, DO [facility]  If 7PM-7AM, please contact night-coverage [email or web address]  07/30/2024, 7:14 AM      "

## 2024-07-30 NOTE — Evaluation (Signed)
 Occupational Therapy Evaluation Patient Details Name: Pam [name] MRN: [id number] DOB: [birth year] Today's Date: 07/30/2024   History of Present Illness   Pt is a 73 y.o female admitted 12/24 for SOB and bil LE edema. Chest x-ray showed pulmonary edema. s/p 12/29 Right sided axillobifemoral bypass, Right common femoral endarterectomy    PMH: CAD, CHF, PVD, ICD, HLD, PAD, NSTEMI, cataracts     Clinical Impressions Pt c/o pain 5/10 to groin/abdomen, husband present during session. Pt lives at home with husband, 1 STE, PLOF independent, no DME at home. Pt does report increasing difficulty getting in/out of tub/shower. Pt currently limited with mobility and LB ADLs due to pain from recent surgery. Pt mod A for bed mobility, increased time/effort due to pain. Once sitting EOB, Pt able to transfer with RW for support, CGA. Pt stands and performs perineal hygiene with set up/CGA. Pt will need mod A for LB ADLs, husband able to help at home. Pt with L hand/wrist swelling, mild, overall WFLs strength/ROM, left elevated on pillows manage swelling. Pt would benefit from continued acute OT to maximize activity tolerance and functional strength. Recommending HHOT follow up, RW, BSC, and tub bench recommended for return home.      If plan is discharge home, recommend the following:   A little help with walking and/or transfers;A little help with bathing/dressing/bathroom;Assistance with cooking/housework;Assist for transportation;Help with stairs or ramp for entrance     Functional Status Assessment   Patient has had a recent decline in their functional status and demonstrates the ability to make significant improvements in function in a reasonable and predictable amount of time.     Equipment Recommendations   BSC/3in1;Tub/shower bench;Other (comment) (RW)     Recommendations for Other Services         Precautions/Restrictions   Precautions Precautions: Fall Recall of  Precautions/Restrictions: Intact Precaution/Restrictions Comments: watch O2 Restrictions Weight Bearing Restrictions Per Provider Order: No     Mobility Bed Mobility Overal bed mobility: Needs Assistance Bed Mobility: Supine to Sit, Sit to Supine     Supine to sit: Mod assist Sit to supine: Mod assist   General bed mobility comments: mod A in/out of bed, assist to scoot to EOB due to pain, assist for BLEs back to bed due to pain.    Transfers Overall transfer level: Needs assistance Equipment used: Rolling walker (2 wheels) Transfers: Sit to/from Stand Sit to Stand: Contact guard assist           General transfer comment: CGA with RW for support      Balance Overall balance assessment: Needs assistance Sitting-balance support: No upper extremity supported, Feet supported Sitting balance-Leahy Scale: Fair     Standing balance support: Bilateral upper extremity supported, During functional activity Standing balance-Leahy Scale: Poor Standing balance comment: able to stand with one hand supported.                           ADL either performed or assessed with clinical judgement   ADL Overall ADL's : Needs assistance/impaired Eating/Feeding: Set up;Sitting   Grooming: Set up;Sitting   Upper Body Bathing: Set up;Contact guard assist;Sitting   Lower Body Bathing: Moderate assistance;Sitting/lateral leans;Sit to/from stand   Upper Body Dressing : Set up;Contact guard assist;Sitting   Lower Body Dressing: Moderate assistance;Sitting/lateral leans;Sit to/from stand   Toilet Transfer: Contact guard assist;Rolling walker (2 wheels);BSC/3in1   Toileting- Clothing Manipulation and Hygiene: Contact guard assist;Sitting/lateral lean;Sit to/from stand  Functional mobility during ADLs: Contact guard assist;Rolling walker (2 wheels) General ADL Comments: Pt overall set up/CGA, increased time/effort due to pain and SOB. Pt needs mod A for LB ADLs due to  pain when bending over.     Vision Baseline Vision/History: 1 Wears glasses Ability to See in Adequate Light: 0 Adequate Patient Visual Report: No change from baseline       Perception         Praxis         Pertinent Vitals/Pain Pain Assessment Pain Assessment: 0-10 Pain Score: 5  Pain Location: RLE, abdomen Pain Descriptors / Indicators: Aching, Discomfort, Grimacing Pain Intervention(s): Monitored during session     Extremity/Trunk Assessment Upper Extremity Assessment Upper Extremity Assessment: Overall WFL for tasks assessed;LUE deficits/detail LUE Deficits / Details: overall WFLs, noticable mild swelling to L hand/wrist, still overall able to use WFLs. LUE Sensation: WNL LUE Coordination: WNL           Communication Communication Communication: Impaired Factors Affecting Communication: Hearing impaired   Cognition Arousal: Alert Behavior During Therapy: WFL for tasks assessed/performed Cognition: No apparent impairments             OT - Cognition Comments: A/O, fully participates                 Following commands: Intact       Cueing  General Comments   Cueing Techniques: Verbal cues  BP soft but remained stable throughout session, ~100-110 over ~45. Unable to get O2 reading due to weak pleth, Pt on 5L O2 via [place].   Exercises     Shoulder Instructions      Home Living Family/patient expects to be discharged to:: Private residence Living Arrangements: Spouse/significant other Available Help at Discharge: Family;Available 24 hours/day Type of Home: House Home Access: Stairs to enter [employer] of Steps: 1 Entrance Stairs-Rails: None Home Layout: One level     Bathroom Shower/Tub: Chief Strategy Officer: Handicapped height Bathroom Accessibility: No   Home Equipment: None          Prior Functioning/Environment Prior Level of Function : Independent/Modified Independent             Mobility  Comments: Ind, denies falls      OT Problem List: Decreased strength;Decreased activity tolerance;Impaired balance (sitting and/or standing);Cardiopulmonary status limiting activity;Decreased range of motion;Pain;Increased edema   OT Treatment/Interventions: Self-care/ADL training;Therapeutic exercise;Energy conservation;DME and/or AE instruction;Therapeutic activities;Patient/family education;Balance training      OT Goals(Current goals can be found in the care plan section)   Acute Rehab OT Goals Patient Stated Goal: to manage pain OT Goal Formulation: With patient/family Time For Goal Achievement: 08/13/24 Potential to Achieve Goals: Good   OT Frequency:  Min 2X/week    Co-evaluation              AM-PAC OT 6 Clicks Daily Activity     Outcome Measure Help from another person eating meals?: None Help from another person taking care of personal grooming?: A Little Help from another person toileting, which includes using toliet, bedpan, or urinal?: A Little Help from another person bathing (including washing, rinsing, drying)?: A Lot Help from another person to put on and taking off regular upper body clothing?: A Little Help from another person to put on and taking off regular lower body clothing?: A Lot 6 Click Score: 17   End of Session Equipment Utilized During Treatment: Gait belt;Rolling walker (2 wheels);Oxygen Nurse Communication: Mobility status  Activity  Tolerance: Patient tolerated treatment well Patient left: in bed;with call bell/phone within reach;with family/visitor present  OT Visit Diagnosis: Unsteadiness on feet (R26.81);Other abnormalities of gait and mobility (R26.89);Muscle weakness (generalized) (M62.81);Pain Pain - part of body: Hip                Time: 8959-8871 OT Time Calculation (min): 48 min Charges:  OT General Charges $OT Visit: 1 Visit OT Evaluation $OT Eval Moderate Complexity: 1 Mod OT Treatments $Self Care/Home Management :  23-37 mins  [address] OTR/L   Elouise JONELLE Bott 07/30/2024, 11:41 AM

## 2024-07-30 NOTE — Progress Notes (Addendum)
 " Progress Note    07/30/2024 7:08 AM 1 Day Post-Op  Subjective:  hurting all over. Says she tolerated some broth and jello last night   Vitals:   07/30/24 0000 07/30/24 0400  BP:  (!) 112/47  Pulse:  82  Resp: 15 14  Temp:  98.6 F (37 C)  SpO2:  100%   Physical Exam: Cardiac:  regular Lungs:  non labored Incisions:  right infraclavicular incision and left groin incisions clean, dry and intact without swelling or hematoma. Right groin with prevena VAC to suction, good seal Extremities:  BLE well perfused and warm with doppler Dp/PT/Pero signals Abdomen:  soft, non distended, tenderness across right flank and suprapubic region from tunneling as expected Neurologic: alert and oriented  CBC    Component Value Date/Time   WBC 11.7 (H) 07/30/2024 0515   RBC 1.96 (L) 07/30/2024 0515   HGB 6.5 (LL) 07/30/2024 0515   HGB 13.6 06/11/2024 1039   HCT 18.7 (L) 07/30/2024 0515   HCT 39.0 06/11/2024 1039   PLT 421 (H) 07/30/2024 0515   PLT 335 06/11/2024 1039   MCV 95.4 07/30/2024 0515   MCV 102 (H) 06/11/2024 1039   MCH 33.2 07/30/2024 0515   MCHC 34.8 07/30/2024 0515   RDW 14.5 07/30/2024 0515   RDW 13.1 06/11/2024 1039   LYMPHSABS 1.4 07/25/2024 0253   LYMPHSABS 2.9 08/25/2016 1648   MONOABS 1.2 (H) 07/25/2024 0253   EOSABS 0.0 07/25/2024 0253   EOSABS 0.2 08/25/2016 1648   BASOSABS 0.0 07/25/2024 0253   BASOSABS 0.0 08/25/2016 1648    BMET    Component Value Date/Time   NA 122 (L) 07/30/2024 0515   NA 132 (L) 06/11/2024 1039   K 4.6 07/30/2024 0515   CL 89 (L) 07/30/2024 0515   CO2 24 07/30/2024 0515   GLUCOSE 133 (H) 07/30/2024 0515   BUN 22 07/30/2024 0515   BUN 15 06/11/2024 1039   CREATININE 0.90 07/30/2024 0515   CREATININE 1.13 (H) 05/30/2024 0851   CALCIUM  8.1 (L) 07/30/2024 0515   GFRNONAA >60 07/30/2024 0515   GFRAA 78 08/19/2019 0830    INR    Component Value Date/Time   INR 1.1 07/24/2024 1016     Intake/Output Summary (Last 24 hours)  at 07/30/2024 0708 Last data filed at 07/30/2024 0602 Gross per 24 hour  Intake 2340 ml  Output 1150 ml  Net 1190 ml     Assessment/Plan:  73 y.o. female is s/p Right sided axillobifemoral bypass using 8 mm ringed PTFE Right common femoral endarterectomy 1 Day Post-Op   Incisions are all intact and well appearing Right groin prevena vac with good seal BLE well perfused and warm with doppler DP/PT/Pero signals Pain control PRN Hgb 6.5. 1 unit of PRBC ordered PT/OT to evaluate today Mobilize as tolerated Encourage increased po intake   Teretha Damme, PA-C [facility] [phone] 07/30/2024 7:08 AM  VASCULAR STAFF ADDENDUM: I have independently interviewed and examined the patient. I agree with the above.  Pt with excellent signals in the feet bilaterally. Groins soft, prevena to suction. Needs PRBCs OOB to chair today Frail with high risk of wound breakdown due to FFT, poor nutrition Needs agressive nutrition regimen.  Boost TID ordered.  Continues to have pain in the right foot. This may take time. There could also me a musculoskeletal component as well.  Will need bilateral TED hose   Fonda FORBES Rim MD [facility] Phone Number: [phone] 07/30/2024 8:16 AM   "

## 2024-07-30 NOTE — Progress Notes (Signed)
 PHARMACIST LIPID MONITORING   Pam [name] is a 73 y.o. female admitted on 07/24/2024 with bilateral lower extremity critical limb ischemia with rest pain .  Pharmacy has been consulted to optimize lipid-lowering therapy with the indication of secondary prevention for clinical ASCVD.  Recent Labs:  Lipid Panel (last 6 months):   Lab Results  Component Value Date   CHOL 86 07/30/2024   TRIG 50 07/30/2024   HDL 41 07/30/2024   CHOLHDL 2.1 07/30/2024   VLDL 10 07/30/2024   LDLCALC 35 07/30/2024    Hepatic function panel (last 6 months):   Lab Results  Component Value Date   AST 40 07/29/2024   ALT 34 07/29/2024   ALKPHOS 54 07/29/2024   BILITOT 0.3 07/29/2024    SCr (since admission):   Serum creatinine: 0.9 mg/dL 87/69/74 9484 Estimated creatinine clearance: 46.1 mL/min  Current therapy and lipid therapy tolerance Current lipid-lowering therapy: Atorvastatin  80mg  daily Previous lipid-lowering therapies (if applicable): same Documented or reported allergies or intolerances to lipid-lowering therapies (if applicable): None  Assessment:   LDL at goal (LDL 35). No change necessary at this time.   Plan:    1.Statin intensity (high intensity recommended for all patients regardless of the LDL):  No statin changes. The patient is already on a high intensity statin.  2.Add ezetimibe  (if any one of the following):   Not indicated at this time.  3.Refer to lipid clinic:   No  4.Follow-up with:  Cardiology provider - Pam Cash, MD  5.Follow-up labs after discharge:  No changes in lipid therapy, repeat a lipid panel in one year.       Jordy Hewins A. Lyle, PharmD, BCPS, FNKF Clinical Pharmacist  Please utilize Amion for appropriate phone number to reach the unit pharmacist [facility])  07/30/2024, 7:11 AM

## 2024-07-31 DIAGNOSIS — I5020 Unspecified systolic (congestive) heart failure: Secondary | ICD-10-CM | POA: Diagnosis not present

## 2024-07-31 DIAGNOSIS — Z95828 Presence of other vascular implants and grafts: Secondary | ICD-10-CM

## 2024-07-31 LAB — BASIC METABOLIC PANEL WITH GFR
Anion gap: 8 (ref 5–15)
BUN: 30 mg/dL — ABNORMAL HIGH (ref 8–23)
CO2: 24 mmol/L (ref 22–32)
Calcium: 8.8 mg/dL — ABNORMAL LOW (ref 8.9–10.3)
Chloride: 88 mmol/L — ABNORMAL LOW (ref 98–111)
Creatinine, Ser: 1.13 mg/dL — ABNORMAL HIGH (ref 0.44–1.00)
GFR, Estimated: 51 mL/min — ABNORMAL LOW
Glucose, Bld: 106 mg/dL — ABNORMAL HIGH (ref 70–99)
Potassium: 4.4 mmol/L (ref 3.5–5.1)
Sodium: 120 mmol/L — ABNORMAL LOW (ref 135–145)

## 2024-07-31 LAB — CBC
HCT: 27 % — ABNORMAL LOW (ref 36.0–46.0)
Hemoglobin: 9.6 g/dL — ABNORMAL LOW (ref 12.0–15.0)
MCH: 32.5 pg (ref 26.0–34.0)
MCHC: 35.6 g/dL (ref 30.0–36.0)
MCV: 91.5 fL (ref 80.0–100.0)
Platelets: 441 K/uL — ABNORMAL HIGH (ref 150–400)
RBC: 2.95 MIL/uL — ABNORMAL LOW (ref 3.87–5.11)
RDW: 16.7 % — ABNORMAL HIGH (ref 11.5–15.5)
WBC: 13.7 K/uL — ABNORMAL HIGH (ref 4.0–10.5)
nRBC: 0.3 % — ABNORMAL HIGH (ref 0.0–0.2)

## 2024-07-31 LAB — TYPE AND SCREEN
ABO/RH(D): A POS
Antibody Screen: NEGATIVE
Unit division: 0

## 2024-07-31 LAB — POCT ACTIVATED CLOTTING TIME
Activated Clotting Time: 204 s
Activated Clotting Time: 230 s

## 2024-07-31 LAB — BPAM RBC
Blood Product Expiration Date: 202601162359
ISSUE DATE / TIME: 202512300802
Unit Type and Rh: 6200

## 2024-07-31 MED ORDER — MIDODRINE HCL 5 MG PO TABS
10.0000 mg | ORAL_TABLET | Freq: Three times a day (TID) | ORAL | Status: DC
  Administered 2024-07-31 – 2024-08-02 (×6): 10 mg via ORAL
  Filled 2024-07-31 (×6): qty 2

## 2024-07-31 MED ORDER — SODIUM CHLORIDE 1 G PO TABS: 1.0000 g | ORAL_TABLET | Freq: Two times a day (BID) | ORAL | Status: DC

## 2024-07-31 MED ORDER — FUROSEMIDE 40 MG PO TABS
40.0000 mg | ORAL_TABLET | Freq: Two times a day (BID) | ORAL | Status: DC
  Administered 2024-07-31 – 2024-08-01 (×2): 40 mg via ORAL
  Filled 2024-07-31 (×3): qty 1

## 2024-07-31 MED ORDER — MIDODRINE HCL 5 MG PO TABS
10.0000 mg | ORAL_TABLET | Freq: Once | ORAL | Status: AC
  Administered 2024-07-31: 10 mg via ORAL
  Filled 2024-07-31: qty 2

## 2024-07-31 MED ORDER — SODIUM CHLORIDE 1 G PO TABS
1.0000 g | ORAL_TABLET | Freq: Two times a day (BID) | ORAL | Status: DC
  Administered 2024-07-31 – 2024-08-02 (×5): 1 g via ORAL
  Filled 2024-07-31 (×5): qty 1

## 2024-07-31 MED ORDER — FUROSEMIDE 40 MG PO TABS: 40.0000 mg | ORAL_TABLET | Freq: Two times a day (BID) | ORAL | Status: DC

## 2024-07-31 NOTE — Care Management Important Message (Signed)
 Important Message  Patient Details  Name: Pam [name] MRN: [id number] Date of Birth: [birth year]   Important Message Given:  Yes - Medicare IM     Pam [name] 07/31/2024, 10:54 AM

## 2024-07-31 NOTE — Progress Notes (Signed)
 Physical Therapy Treatment Patient Details Name: Pam [name] MRN: [id number] DOB: 1950/08/20 Today's Date: 07/31/2024   History of Present Illness Pt is a 73 y.o female admitted 12/24 for SOB and bil LE edema. Chest x-ray showed pulmonary edema. Pt is scheduled for bypass surgery on 12/29. PMH: CAD, CHF, PVD, ICD, HLD, PAD, NSTEMI, cataracts    PT Comments  With encouragement after getting her midodrine  and inhaler pt agreeable to get up with therapy. Pt reports pain in her abdomen/groin as well as RLE>LLE. Pt BP prior to ambulation 127/48 MAP 73, and with sitting in recliner at end of session BP 117/54 MAP 73. Currently ambulating on 3L O2 via [place]. Pt overall contact guard for bed mobility, transfers, and short distance ambulation in room with RW. At this point PT recommending HHPT at discharge, however educated pt that if she does not continue to get up consistently to walk she will continue to have a decrease in strength making it difficult for her husband to care for her. Pt voices understanding. PT will continue to follow acutely.    If plan is discharge home, recommend the following: A little help with walking and/or transfers;A little help with bathing/dressing/bathroom;Assist for transportation;Help with stairs or ramp for entrance   Can travel by private vehicle        Equipment Recommendations  Rolling walker (2 wheels)       Precautions / Restrictions Precautions Precautions: Fall Recall of Precautions/Restrictions: Intact Precaution/Restrictions Comments: watch o2 Restrictions Weight Bearing Restrictions Per Provider Order: No     Mobility  Bed Mobility Overal bed mobility: Needs Assistance Bed Mobility: Supine to Sit     Supine to sit: Contact guard, HOB elevated, Used rails     General bed mobility comments: contact guard for safety, with HoB elevated and use of bed rail pt requires increased time and effort to come to the EoB    Transfers Overall transfer  level: Needs assistance Equipment used: Rolling walker (2 wheels) Transfers: Sit to/from Stand Sit to Stand: Contact guard assist           General transfer comment: CGA for safety for power up from bed and BSC,  cues for hand placement to power    Ambulation/Gait Ambulation/Gait assistance: Contact guard assist Gait Distance (Feet): 20 Feet Assistive device: Rolling walker (2 wheels) Gait Pattern/deviations: Step-through pattern, Decreased stride length, Trunk flexed, Decreased step length - right, Shuffle Gait velocity: reduced Gait velocity interpretation: <1.31 ft/sec, indicative of household ambulator   General Gait Details: pt is able to ambulate to door and back with RW, CGA and increased cuing for proximity to RW, and for navigation around obstacles in room         Balance Overall balance assessment: Needs assistance Sitting-balance support: No upper extremity supported, Feet supported Sitting balance-Leahy Scale: Fair Sitting balance - Comments: seated EOB   Standing balance support: Bilateral upper extremity supported, During functional activity, Reliant on assistive device for balance Standing balance-Leahy Scale: Poor Standing balance comment: reliant on external support for stability                            Communication Communication Communication: Impaired Factors Affecting Communication: Hearing impaired  Cognition Arousal: Alert Behavior During Therapy: WFL for tasks assessed/performed   PT - Cognitive impairments: No apparent impairments  Following commands: Intact      Cueing Cueing Techniques: Verbal cues     General Comments General comments (skin integrity, edema, etc.): Midodrine  prior to session supine BP 127/48 MAP (73), abter ambulation BP 117/54 MAP (73) HR max noted with ambulation 95 bpm, Pt ambulated on 3L O2 via [place], very difficult to get SpO2 reading but prior to ambulation SpO2 92%O2  with seated in recliner SpO2 on 3L O2 via [place] 91%O2 via [place]      Pertinent Vitals/Pain Pain Assessment Pain Assessment: 0-10 Pain Score: 7  Pain Location: RLE, abdomen with moving around Pain Descriptors / Indicators: Discomfort, Grimacing, Heaviness, Sore Pain Intervention(s): Limited activity within patient's tolerance, Monitored during session, Repositioned     PT Goals (current goals can now be found in the care plan section) Acute Rehab PT Goals Patient Stated Goal: to feel better PT Goal Formulation: With patient/family Time For Goal Achievement: 08/09/24 Potential to Achieve Goals: Good Progress towards PT goals: Progressing toward goals    Frequency    Min 2X/week       AM-PAC PT 6 Clicks Mobility   Outcome Measure  Help needed turning from your back to your side while in a flat bed without using bedrails?: A Little Help needed moving from lying on your back to sitting on the side of a flat bed without using bedrails?: A Little Help needed moving to and from a bed to a chair (including a wheelchair)?: A Little Help needed standing up from a chair using your arms (e.g., wheelchair or bedside chair)?: A Little Help needed to walk in hospital room?: A Little Help needed climbing 3-5 steps with a railing? : Total 6 Click Score: 16    End of Session Equipment Utilized During Treatment: Oxygen Activity Tolerance: Patient tolerated treatment well Patient left: with call bell/phone within reach;with family/visitor present;in chair;with chair alarm set Nurse Communication: Mobility status;Other (comment) (oxygen) PT Visit Diagnosis: Other abnormalities of gait and mobility (R26.89);Muscle weakness (generalized) (M62.81);Pain;Difficulty in walking, not elsewhere classified (R26.2) Pain - Right/Left:  (bilateral) Pain - part of body: Leg;Ankle and joints of foot (abdo,em/groin)     Time: 1110-1200 PT Time Calculation (min) (ACUTE ONLY): 50 min  Charges:    $Gait  Training: 8-22 mins $Therapeutic Activity: 23-37 mins PT General Charges $$ ACUTE PT VISIT: 1 Visit                     Pam [name] PT, DPT Acute Rehabilitation Services Please use secure chat or  Call Office [phone]    Pam [name] 07/31/2024, 12:33 PM

## 2024-07-31 NOTE — Plan of Care (Signed)

## 2024-07-31 NOTE — Progress Notes (Signed)
 Mobility Specialist: Progress Note   07/31/24 1600  Mobility  Activity Pivoted/transferred from chair to bed;Pivoted/transferred to/from [facility]  Level of Assistance Contact guard assist, steadying assist  Assistive Device Front wheel walker;Other (Comment) (HHA)  Activity Response Tolerated well  Mobility Referral Yes  Mobility visit 1 Mobility  Mobility Specialist Start Time (ACUTE ONLY) 1440  Mobility Specialist Stop Time (ACUTE ONLY) 1453  Mobility Specialist Time Calculation (min) (ACUTE ONLY) 13 min    Pt received in chair, requesting assistance back to bed. CGA for step-pivot transfer to bed. BM incontinence during mobility that required totA for pericare in bed. Needed a linen change and requested to sit on [facility] while waiting for sheets to be changed. Once on [facility], pt continued to have BM and wanted to sit on it for awhile. Left on BSC, all needs met, call bell in reach. Husband present.   Ileana Lute Mobility Specialist Please contact via SecureChat or Rehab office at [phone]

## 2024-07-31 NOTE — Progress Notes (Signed)
 PT Cancellation Note  Patient Details Name: Pam [name] MRN: [id number] DOB: Nov 17, 1950   Cancelled Treatment:    Reason Eval/Treat Not Completed: (P) Patient not medically ready Pt BP is 93/38 MAP (54). RN administering midodrine . PT will follow back to check appropriateness later this morning.   Teresa Nicodemus B. Fleeta Lapidus PT, DPT Acute Rehabilitation Services Please use secure chat or  Call Office [phone]     [name] 07/31/2024, 10:01 AM

## 2024-07-31 NOTE — Progress Notes (Signed)
 " PROGRESS NOTE  Pam [name] FMW:[id number] DOB: 28-Aug-1950 DOA: 07/24/2024 PCP: Andrew Truman GRADE., MD   LOS: 7 days   Brief Narrative / Interim history: 73 year old female with chronic systolic CHF with EF 35-40%, ICD, CAD, HTN, HLD, PAD, who comes into the hospital with leg pain.  She has known PAD and vascular surgery was consulted, and is now s/p right sided axillobifemoral bypass using 8 mm ringed PTFE & right common femoral endarterectomy on 12/29.  Hospital course complicated by acute on chronic hyponatremia and fluid overload  Subjective / 24h Interval events: She is doing fairly well this morning, complains of some swelling in her hands.  Pain is controlled.  Her husband is at bedside.  She has no chest pain, no shortness of breath, no nausea or vomiting  Assesement and Plan: Principal problem Acute hypoxemic respiratory failure due to acute on chronic combined CHF -she is not on oxygen at baseline, has required it here.  She initially received IV diuresis, but due to ongoing hypotension she was placed on midodrine  and transition to oral furosemide  - Still appears somewhat fluid overloaded, increase oral furosemide  and increase midodrine  today - Most recent 2D echo done beginning of December 2025 showed LVEF 35-40%, grade 1 diastolic dysfunction.  RV systolic function and size were normal  Active problems Critical limb ischemia -vascular surgery consulted and followed patient while hospitalized.  She is now status post right sided axillobifemoral bypass using 8 mm ringed PTFE & right common femoral endarterectomy on 12/29. - Back on Plavix , continue along with aspirin , continue statin  Hyponatremia -subacute on chronic, her baseline is in the mid upper 120s.  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  Increased dose of Lasix , will place on few doses of salt tabs but have to be very careful due to underlying  CHF  Hypothyroidism-continue Synthroid   History of COPD-no wheezing, continue inhalers, oxygen as needed  Hypokalemia-continue to monitor and replenish as indicated  Hypotension-continue midodrine  along with diuretics  Scheduled Meds:  aspirin  EC  81 mg Oral Q0600   atorvastatin   80 mg Oral Daily   clopidogrel   75 mg Oral Q0600   digoxin   0.0625 mg Oral Daily   enoxaparin  (LOVENOX ) injection  40 mg Subcutaneous Q24H   feeding supplement  237 mL Oral TID WC   furosemide   40 mg Oral BID   levothyroxine   37.5 mcg Oral Q0600   midodrine   10 mg Oral TID WC   polyethylene glycol  17 g Oral BID   ranolazine   1,000 mg Oral BID   senna-docusate  1 tablet Oral BID   sodium chloride  flush  3 mL Intravenous Q12H   sodium chloride   1 g Oral BID WC   umeclidinium-vilanterol  1 puff Inhalation Daily   valACYclovir   1,000 mg Oral Daily   Continuous Infusions: PRN Meds:.acetaminophen , calcium  carbonate, hydrALAZINE , HYDROmorphone  (DILAUDID ) injection, labetalol , ondansetron  (ZOFRAN ) IV, oxyCODONE -acetaminophen , phenol, potassium chloride , sodium chloride  flush  Current Outpatient Medications  Medication Instructions   acetaminophen  (TYLENOL ) 1,000 mg, Every 6 hours PRN   albuterol  (VENTOLIN  HFA) 108 (90 Base) MCG/ACT inhaler 2 puffs, Inhalation, Every 6 hours PRN   aspirin  81 mg, Daily   atorvastatin  (LIPITOR ) 80 MG tablet TAKE 1 TABLET(80 MG) BY MOUTH DAILY   bisoprolol  (ZEBETA ) 2.5 mg, Oral, Daily   CALCIUM  PO 1 tablet, Daily   clopidogrel  (PLAVIX ) 75 MG tablet TAKE 1 TABLET(75 MG) BY  MOUTH DAILY WITH BREAKFAST   digoxin  (LANOXIN ) 0.0625 mg, Oral, Daily   furosemide  (LASIX ) 20 mg, Oral, As needed   levothyroxine  (SYNTHROID ) 25 MCG tablet Take 37.5 mcg by mouth daily before breakfast.   nitroGLYCERIN  (NITROSTAT ) 0.4 mg, Sublingual, Every 5 min x3 PRN   nystatin cream (MYCOSTATIN) 1 Application, 2 times daily PRN   ranolazine  (RANEXA ) 1000 MG SR tablet TAKE 1 TABLET(1000 MG) BY MOUTH  TWICE DAILY   sacubitril -valsartan  (ENTRESTO ) 49-51 MG 1 tablet, Oral, 2 times daily   spironolactone  (ALDACTONE ) 25 MG tablet TAKE 1 TABLET(25 MG) BY MOUTH DAILY   umeclidinium-vilanterol (ANORO ELLIPTA ) 62.5-25 MCG/ACT AEPB 1 puff, Inhalation, Daily   valACYclovir  (VALTREX ) 1,000 mg, Daily   Vitamin D3 2,000 Units, Daily    Diet Orders (From admission, onward)     Start     Ordered   07/30/24 1200  Diet renal/carb modified with fluid restriction Diet-HS Snack? Nothing; Fluid restriction: 1200 mL Fluid; Room service appropriate? Yes; Fluid consistency: Thin  Diet effective now       Question Answer Comment  Diet-HS Snack? Nothing   Fluid restriction: 1200 mL Fluid   Room service appropriate? Yes   Fluid consistency: Thin      07/30/24 1159            DVT prophylaxis: enoxaparin  (LOVENOX ) injection 40 mg Start: 07/30/24 1000 SCD's Start: 07/29/24 1604   Lab Results  Component Value Date   PLT 441 (H) 07/31/2024      Code Status: Full Code  Family Communication: Husband at bedside  Status is: Inpatient Remains inpatient appropriate because: Hyponatremia   Level of care: Progressive  Consultants:  Vascular surgery  Objective: Vitals:   07/31/24 0323 07/31/24 0554 07/31/24 0613 07/31/24 0811  BP: (!) 108/54  (!) 108/48 (!) 101/57  Pulse: 72  74 73  Resp: 20 17 14 18   Temp: 98.2 F (36.8 C)   97.7 F (36.5 C)  TempSrc: Oral   Oral  SpO2: 96%  98% 98%  Weight:  61 kg    Height:        Intake/Output Summary (Last 24 hours) at 07/31/2024 1031 Last data filed at 07/31/2024 0615 Gross per 24 hour  Intake 1065 ml  Output --  Net 1065 ml   Wt Readings from Last 3 Encounters:  07/31/24 61 kg  07/23/24 49.9 kg  07/16/24 50.4 kg    Examination:  Constitutional: NAD Eyes: no scleral icterus ENMT: Mucous membranes are moist.  Neck: normal, supple Respiratory: clear to auscultation bilaterally, no wheezing, no crackles.  Cardiovascular: Regular rate  and rhythm, no murmurs / rubs / gallops.  Trace LE edema. Abdomen: non distended, no tenderness. Bowel sounds positive.  Musculoskeletal: no clubbing / cyanosis.   Data Reviewed: I have independently reviewed following labs and imaging studies   CBC Recent Labs  Lab 07/24/24 1540 07/25/24 0253 07/29/24 1145 07/30/24 0515 07/31/24 0427  WBC 8.2 12.8*  --  11.7* 13.7*  HGB 8.3* 8.1* 11.6* 6.5* 9.6*  HCT 24.0* 23.0* 34.0* 18.7* 27.0*  PLT 379 395  --  421* 441*  MCV 99.6 97.5  --  95.4 91.5  MCH 34.4* 34.3*  --  33.2 32.5  MCHC 34.6 35.2  --  34.8 35.6  RDW 14.1 14.1  --  14.5 16.7*  LYMPHSABS 1.4 1.4  --   --   --   MONOABS 0.8 1.2*  --   --   --   EOSABS 0.0 0.0  --   --   --  BASOSABS 0.0 0.0  --   --   --     Recent Labs  Lab 07/24/24 1843 07/25/24 0253 07/28/24 0209 07/29/24 0029 07/29/24 0437 07/29/24 1145 07/30/24 0515 07/31/24 0427  NA 119*   < > 120* 122* 120* 124* 122* 120*  K 4.4   < > 3.5 4.0 3.4* 3.4* 4.6 4.4  CL 88*   < > 86* 85* 86*  --  89* 88*  CO2 17*   < > 26 26 25   --  24 24  GLUCOSE 130*   < > 103* 119* 112*  --  133* 106*  BUN 32*   < > 26* 25* 23  --  22 30*  CREATININE 0.97   < > 0.88 1.00 0.85  --  0.90 1.13*  CALCIUM  9.5   < > 8.1* 8.7* 8.6*  --  8.1* 8.8*  AST  --   --   --  40  --   --   --   --   ALT  --   --   --  34  --   --   --   --   ALKPHOS  --   --   --  54  --   --   --   --   BILITOT  --   --   --  0.3  --   --   --   --   ALBUMIN  --   --   --  3.8  --   --   --   --   PROCALCITON <0.10  --   --   --   --   --   --   --    < > = values in this interval not displayed.    ------------------------------------------------------------------------------------------------------------------ Recent Labs    07/30/24 0515  CHOL 86  HDL 41  LDLCALC 35  TRIG 50  CHOLHDL 2.1    Lab Results  Component Value Date   HGBA1C 5.4 05/30/2024    ------------------------------------------------------------------------------------------------------------------ No results for input(s): TSH, T4TOTAL, T3FREE, THYROIDAB in the last 72 hours.  Invalid input(s): FREET3  Cardiac Enzymes No results for input(s): CKMB, TROPONINI, MYOGLOBIN in the last 168 hours.  Invalid input(s): CK ------------------------------------------------------------------------------------------------------------------    Component Value Date/Time   BNP 841.1 (H) 05/29/2024 1206    CBG: Recent Labs  Lab 07/25/24 1159  GLUCAP 95    Recent Results (from the past 240 hours)  Surgical pcr screen     Status: None   Collection Time: 07/23/24  9:15 AM   Specimen: Nasal Mucosa; Nasal Swab  Result Value Ref Range Status   MRSA, PCR NEGATIVE NEGATIVE Final   Staphylococcus aureus NEGATIVE NEGATIVE Final    Comment: (NOTE) The Xpert SA Assay (FDA approved for NASAL specimens in patients 68 years of age and older), is one component of a comprehensive surveillance program. It is not intended to diagnose infection nor to guide or monitor treatment. Performed at [facility], [address], [place], [place] [address]   Resp panel by RT-PCR (RSV, Flu A&B, Covid) Anterior Nasal Swab     Status: None   Collection Time: 07/24/24 10:33 AM   Specimen: Anterior Nasal Swab  Result Value Ref Range Status   SARS Coronavirus 2 by RT PCR NEGATIVE NEGATIVE Final   Influenza A by PCR NEGATIVE NEGATIVE Final   Influenza B by PCR NEGATIVE NEGATIVE Final    Comment: (NOTE) The Xpert Xpress SARS-CoV-2/FLU/RSV  plus assay is intended as an aid in the diagnosis of influenza from Nasopharyngeal swab specimens and should not be used as a sole basis for treatment. Nasal washings and aspirates are unacceptable for Xpert Xpress SARS-CoV-2/FLU/RSV testing.  Fact Sheet for Patients: bloggercourse.com  Fact Sheet for Healthcare  Providers: seriousbroker.it  This test is not yet approved or cleared by the [place]  FDA and has been authorized for detection and/or diagnosis of SARS-CoV-2 by FDA under an Emergency Use Authorization (EUA). This EUA will remain in effect (meaning this test can be used) for the duration of the COVID-19 declaration under Section 564(b)(1) of the Act, 21 U.S.C. section 360bbb-3(b)(1), unless the authorization is terminated or revoked.     Resp Syncytial Virus by PCR NEGATIVE NEGATIVE Final    Comment: (NOTE) Fact Sheet for Patients: bloggercourse.com  Fact Sheet for Healthcare Providers: seriousbroker.it  This test is not yet approved or cleared by the [place]  FDA and has been authorized for detection and/or diagnosis of SARS-CoV-2 by FDA under an Emergency Use Authorization (EUA). This EUA will remain in effect (meaning this test can be used) for the duration of the COVID-19 declaration under Section 564(b)(1) of the Act, 21 U.S.C. section 360bbb-3(b)(1), unless the authorization is terminated or revoked.  Performed at [facility], [address], [place], [place] [address]      Radiology Studies: No results found.   Nilda Fendt, MD, PhD [facility]  Between 7 am - 7 pm I am available, please contact me via Amion (for emergencies) or Securechat (non urgent messages)  Between 7 pm - 7 am I am not available, please contact night coverage MD/APP via Amion  "

## 2024-07-31 NOTE — Progress Notes (Addendum)
 " Progress Note    07/31/2024 7:55 AM 2 Days Post-Op  Subjective:  sitting up eating breakfast, says still having a lot of pain but it is better controlled   Vitals:   07/31/24 0554 07/31/24 0613  BP:  (!) 108/48  Pulse:  74  Resp: 17 14  Temp:    SpO2:  98%   Physical Exam: Cardiac:  regular Lungs:  non labored Incisions:  right infraclavicular incision is c/d/I without swelling or hematoma, right groin with Prevena VAC with good seal, left groin incision c/d/I without swelling or hematoma Extremities:  BLE well perfused and warm with Doppler Dp/PT signals Abdomen:  soft, non distended, expected tenderness across tunneling on right flank and super pubic region Neurologic: alert and oriented  CBC    Component Value Date/Time   WBC 13.7 (H) 07/31/2024 0427   RBC 2.95 (L) 07/31/2024 0427   HGB 9.6 (L) 07/31/2024 0427   HGB 13.6 06/11/2024 1039   HCT 27.0 (L) 07/31/2024 0427   HCT 39.0 06/11/2024 1039   PLT 441 (H) 07/31/2024 0427   PLT 335 06/11/2024 1039   MCV 91.5 07/31/2024 0427   MCV 102 (H) 06/11/2024 1039   MCH 32.5 07/31/2024 0427   MCHC 35.6 07/31/2024 0427   RDW 16.7 (H) 07/31/2024 0427   RDW 13.1 06/11/2024 1039   LYMPHSABS 1.4 07/25/2024 0253   LYMPHSABS 2.9 08/25/2016 1648   MONOABS 1.2 (H) 07/25/2024 0253   EOSABS 0.0 07/25/2024 0253   EOSABS 0.2 08/25/2016 1648   BASOSABS 0.0 07/25/2024 0253   BASOSABS 0.0 08/25/2016 1648    BMET    Component Value Date/Time   NA 120 (L) 07/31/2024 0427   NA 132 (L) 06/11/2024 1039   K 4.4 07/31/2024 0427   CL 88 (L) 07/31/2024 0427   CO2 24 07/31/2024 0427   GLUCOSE 106 (H) 07/31/2024 0427   BUN 30 (H) 07/31/2024 0427   BUN 15 06/11/2024 1039   CREATININE 1.13 (H) 07/31/2024 0427   CREATININE 1.13 (H) 05/30/2024 0851   CALCIUM  8.8 (L) 07/31/2024 0427   GFRNONAA 51 (L) 07/31/2024 0427   GFRAA 78 08/19/2019 0830    INR    Component Value Date/Time   INR 1.1 07/24/2024 1016     Intake/Output  Summary (Last 24 hours) at 07/31/2024 0755 Last data filed at 07/31/2024 0615 Gross per 24 hour  Intake 1165 ml  Output --  Net 1165 ml     Assessment/Plan:  73 y.o. female is s/p  Right sided axillobifemoral bypass using 8 mm ringed PTFE Right common femoral endarterectomy  2 Days Post-Op   BLE well perfused and warm with Doppler signals- signals more brisk this morning Right groin with Prevena VAC, left groin incision intact and well appearing. Infraclavicular incision is c/di Pain control as needed She is tolerating diet. Encourage continued po intake for healing H&H stable post transfusion Hypotension improved with midodrine  Continue to work with therapy teams   Teretha Damme, [place] [facility] [phone] 07/31/2024 7:55 AM  VASCULAR STAFF ADDENDUM: I have independently interviewed and examined the patient. I agree with the above.  Husband at bedside Overall, I think she is doing well. Needs TED hose for lower extremity edema. Wounds healing appropriately.  Will keep the VAC on the right groin for 7 days H&H stable Dual antiplatelet therapy Therapy teams Possible diuresis tomorrow pending blood pressure. Discussed the importance of nutrition  Fonda FORBES Rim MD [facility]  Phone Number: [phone] 07/31/2024 2:14 PM   "

## 2024-07-31 NOTE — Progress Notes (Signed)
 Encouraged pt to use incentive spirometer. Pt states she has one and she two at home. Pt says that she will try to use some today. Educated and will continue to encourage use. Pt has weak productive cough. Ann Nena Hoard, RN

## 2024-08-01 ENCOUNTER — Inpatient Hospital Stay (HOSPITAL_COMMUNITY)

## 2024-08-01 DIAGNOSIS — I5022 Chronic systolic (congestive) heart failure: Secondary | ICD-10-CM

## 2024-08-01 DIAGNOSIS — I5020 Unspecified systolic (congestive) heart failure: Secondary | ICD-10-CM | POA: Diagnosis not present

## 2024-08-01 LAB — COMPREHENSIVE METABOLIC PANEL WITH GFR
ALT: 13 U/L (ref 0–44)
AST: 29 U/L (ref 15–41)
Albumin: 3.3 g/dL — ABNORMAL LOW (ref 3.5–5.0)
Alkaline Phosphatase: 59 U/L (ref 38–126)
Anion gap: 10 (ref 5–15)
BUN: 27 mg/dL — ABNORMAL HIGH (ref 8–23)
CO2: 23 mmol/L (ref 22–32)
Calcium: 8.4 mg/dL — ABNORMAL LOW (ref 8.9–10.3)
Chloride: 88 mmol/L — ABNORMAL LOW (ref 98–111)
Creatinine, Ser: 0.85 mg/dL (ref 0.44–1.00)
GFR, Estimated: >60 mL/min
Glucose, Bld: 112 mg/dL — ABNORMAL HIGH (ref 70–99)
Potassium: 4.1 mmol/L (ref 3.5–5.1)
Sodium: 121 mmol/L — ABNORMAL LOW (ref 135–145)
Total Bilirubin: 0.4 mg/dL (ref 0.0–1.2)
Total Protein: 5.4 g/dL — ABNORMAL LOW (ref 6.5–8.1)

## 2024-08-01 LAB — ECHOCARDIOGRAM COMPLETE
Area-P 1/2: 3.89 cm2
Height: 63 in
S' Lateral: 3.3 cm
Weight: 2016 [oz_av]

## 2024-08-01 LAB — CBC
HCT: 26 % — ABNORMAL LOW (ref 36.0–46.0)
Hemoglobin: 9.1 g/dL — ABNORMAL LOW (ref 12.0–15.0)
MCH: 31.7 pg (ref 26.0–34.0)
MCHC: 35 g/dL (ref 30.0–36.0)
MCV: 90.6 fL (ref 80.0–100.0)
Platelets: 439 K/uL — ABNORMAL HIGH (ref 150–400)
RBC: 2.87 MIL/uL — ABNORMAL LOW (ref 3.87–5.11)
RDW: 15.9 % — ABNORMAL HIGH (ref 11.5–15.5)
WBC: 12.6 K/uL — ABNORMAL HIGH (ref 4.0–10.5)
nRBC: 0 % (ref 0.0–0.2)

## 2024-08-01 LAB — MAGNESIUM: Magnesium: 1.9 mg/dL (ref 1.7–2.4)

## 2024-08-01 MED ORDER — PERFLUTREN LIPID MICROSPHERE
1.0000 mL | INTRAVENOUS | Status: AC | PRN
  Administered 2024-08-01: 3 mL via INTRAVENOUS

## 2024-08-01 MED ORDER — FUROSEMIDE 10 MG/ML IJ SOLN
40.0000 mg | Freq: Two times a day (BID) | INTRAMUSCULAR | Status: DC
  Administered 2024-08-01 (×2): 40 mg via INTRAVENOUS
  Filled 2024-08-01 (×2): qty 4

## 2024-08-01 NOTE — Progress Notes (Signed)
 Occupational Therapy Treatment Patient Details Name: Pam [name] MRN: [id number] DOB: [birth year] Today's Date: 08/01/2024   History of present illness Pt is a 74 y.o female admitted 12/24 for SOB and bil LE edema. Chest x-ray showed pulmonary edema. Pt is scheduled for bypass surgery on 12/29. PMH: CAD, CHF, PVD, ICD, HLD, PAD, NSTEMI, cataracts   OT comments  Patient demonstrating good gains with OT treatment with patient able to ambulate to sink from recliner with CGA. Patient able to stand at sink for grooming and performed UB bathing/dressing seated at sink due to fatigue. Discharge recommendations continue to be appropriate. Acute OT to continue to follow to address established goals.       If plan is discharge home, recommend the following:  A little help with walking and/or transfers;A little help with bathing/dressing/bathroom;Assistance with cooking/housework;Assist for transportation;Help with stairs or ramp for entrance   Equipment Recommendations  BSC/3in1;Tub/shower bench;Other (comment) (RW)    Recommendations for Other Services      Precautions / Restrictions Precautions Precautions: Fall Recall of Precautions/Restrictions: Intact Precaution/Restrictions Comments: watch o2 Restrictions Weight Bearing Restrictions Per Provider Order: No       Mobility Bed Mobility Overal bed mobility: Needs Assistance Bed Mobility: Sit to Supine       Sit to supine: Mod assist   General bed mobility comments: assistance with BLEs    Transfers Overall transfer level: Needs assistance Equipment used: Rolling walker (2 wheels) Transfers: Sit to/from Stand Sit to Stand: Contact guard assist           General transfer comment: cues for hand placement and CGA to power up and for safety with transfers     Balance Overall balance assessment: Needs assistance Sitting-balance support: No upper extremity supported, Feet supported Sitting balance-Leahy Scale: Fair Sitting  balance - Comments: seated EOB   Standing balance support: Single extremity supported, Bilateral upper extremity supported, During functional activity Standing balance-Leahy Scale: Poor Standing balance comment: reliant on RW or sink for support                           ADL either performed or assessed with clinical judgement   ADL Overall ADL's : Needs assistance/impaired     Grooming: Wash/dry hands;Wash/dry face;Brushing hair;Set up;Sitting;Contact guard assist;Standing Grooming Details (indicate cue type and reason): performed hand and face hygiene standing and performed hair brushing in sitting Upper Body Bathing: Set up;Sitting   Lower Body Bathing: Moderate assistance;Sitting/lateral leans;Sit to/from stand   Upper Body Dressing : Set up;Sitting Upper Body Dressing Details (indicate cue type and reason): gown change                        Extremity/Trunk Assessment              Vision       Perception     Praxis     Communication Communication Communication: Impaired Factors Affecting Communication: Hearing impaired   Cognition Arousal: Alert Behavior During Therapy: WFL for tasks assessed/performed Cognition: No apparent impairments                               Following commands: Intact        Cueing   Cueing Techniques: Verbal cues  Exercises      Shoulder Instructions       General Comments SpO2 97% on 3 liters  Pertinent Vitals/ Pain       Pain Assessment Pain Assessment: 0-10 Pain Score: 6  Pain Location: RLE, abdomen with moving around Pain Descriptors / Indicators: Discomfort, Grimacing, Heaviness, Sore Pain Intervention(s): Limited activity within patient's tolerance, Monitored during session, Repositioned  Home Living                                          Prior Functioning/Environment              Frequency  Min 2X/week        Progress Toward Goals  OT  Goals(current goals can now be found in the care plan section)  Progress towards OT goals: Progressing toward goals  Acute Rehab OT Goals Patient Stated Goal: to go home OT Goal Formulation: With patient/family Time For Goal Achievement: 08/13/24 Potential to Achieve Goals: Good ADL Goals Pt Will Perform Grooming: with modified independence;standing Pt Will Perform Upper Body Dressing: with set-up Pt Will Transfer to Toilet: with supervision Pt Will Perform Toileting - Clothing Manipulation and hygiene: with modified independence Pt Will Perform Tub/Shower Transfer: with supervision Additional ADL Goal #1: Pt will verbalize at least 3 energy conservation strategies for ADLs Additional ADL Goal #2: Pt will tolerate at least 10 minutes of standing activity at mod I to increase activity tolerance for ADLs  Plan      Co-evaluation                 AM-PAC OT 6 Clicks Daily Activity     Outcome Measure   Help from another person eating meals?: None Help from another person taking care of personal grooming?: A Little Help from another person toileting, which includes using toliet, bedpan, or urinal?: A Little Help from another person bathing (including washing, rinsing, drying)?: A Lot Help from another person to put on and taking off regular upper body clothing?: A Little Help from another person to put on and taking off regular lower body clothing?: A Lot 6 Click Score: 17    End of Session Equipment Utilized During Treatment: Gait belt;Rolling walker (2 wheels);Oxygen  OT Visit Diagnosis: Unsteadiness on feet (R26.81);Other abnormalities of gait and mobility (R26.89);Muscle weakness (generalized) (M62.81);Pain Pain - Right/Left: Right Pain - part of body: Hip   Activity Tolerance Patient tolerated treatment well   Patient Left in bed;with call bell/phone within reach;with family/visitor present   Nurse Communication Mobility status        Time: 8679-8646 OT Time  Calculation (min): 33 min  Charges: OT General Charges $OT Visit: 1 Visit OT Treatments $Self Care/Home Management : 23-37 mins  Dick Laine, OTA Acute Rehabilitation Services  Office [phone]   [name] 08/01/2024, 2:16 PM

## 2024-08-01 NOTE — Progress Notes (Addendum)
 " Progress Note    08/01/2024 8:14 AM 3 Days Post-Op  Subjective:  better now that she is on air mattress   Vitals:   08/01/24 0640 08/01/24 0735  BP:  (!) 120/53  Pulse: 70 70  Resp: 19 15  Temp:  97.6 F (36.4 C)  SpO2: 97% 97%   Physical Exam: Cardiac:  regular Lungs:  non labored Incisions:  right groin with Prevena VAC to suction, good seal. Left groin incision is c/d/I. Right infraclavicular incision is c/d/i Extremities:  BLE well perfused and warm with brisk PT/DP signals Abdomen:  soft, expected tenderness across low abdomen and right flank from tunneling of grafts Neurologic: alert and oriented  CBC    Component Value Date/Time   WBC 13.7 (H) 07/31/2024 0427   RBC 2.95 (L) 07/31/2024 0427   HGB 9.6 (L) 07/31/2024 0427   HGB 13.6 06/11/2024 1039   HCT 27.0 (L) 07/31/2024 0427   HCT 39.0 06/11/2024 1039   PLT 441 (H) 07/31/2024 0427   PLT 335 06/11/2024 1039   MCV 91.5 07/31/2024 0427   MCV 102 (H) 06/11/2024 1039   MCH 32.5 07/31/2024 0427   MCHC 35.6 07/31/2024 0427   RDW 16.7 (H) 07/31/2024 0427   RDW 13.1 06/11/2024 1039   LYMPHSABS 1.4 07/25/2024 0253   LYMPHSABS 2.9 08/25/2016 1648   MONOABS 1.2 (H) 07/25/2024 0253   EOSABS 0.0 07/25/2024 0253   EOSABS 0.2 08/25/2016 1648   BASOSABS 0.0 07/25/2024 0253   BASOSABS 0.0 08/25/2016 1648    BMET    Component Value Date/Time   NA 120 (L) 07/31/2024 0427   NA 132 (L) 06/11/2024 1039   K 4.4 07/31/2024 0427   CL 88 (L) 07/31/2024 0427   CO2 24 07/31/2024 0427   GLUCOSE 106 (H) 07/31/2024 0427   BUN 30 (H) 07/31/2024 0427   BUN 15 06/11/2024 1039   CREATININE 1.13 (H) 07/31/2024 0427   CREATININE 1.13 (H) 05/30/2024 0851   CALCIUM  8.8 (L) 07/31/2024 0427   GFRNONAA 51 (L) 07/31/2024 0427   GFRAA 78 08/19/2019 0830    INR    Component Value Date/Time   INR 1.1 07/24/2024 1016     Intake/Output Summary (Last 24 hours) at 08/01/2024 0814 Last data filed at 08/01/2024 0736 Gross per 24 hour   Intake 600 ml  Output 0 ml  Net 600 ml     Assessment/Plan:  74 y.o. female is s/p  Right sided axillobifemoral bypass using 8 mm ringed PTFE Right common femoral endarterectomy  3 Days Post-Op   BLE remain well perfused and warm with brisk doppler DP/PT signals Continue TED hose BLE Right groin Prevena VAC to suction. Remain on until 1/5. Left groin c/d/I without swelling or hematoma Pain control PRN Tolerating diet. Encourage continued PO intake Morning labs pending Hypotension intermittently. On midodrine  Encourage PT/OT    Teretha Damme, [place] [facility] [phone] 08/01/2024 8:14 AM  VASCULAR STAFF ADDENDUM: I have independently interviewed and examined the patient. I agree with the above.  Overall doing well from a surgical perspective.  Good signals in the feet.  I discussed her care with Dr. Trixie this morning At the time of admission, the patient had underlying failure to thrive with a sacral wound Poor nutritional status, fluid overload Continue to struggle with fluid overload in the setting of poor nutrition and recent surgery.   Working on diuresis, but patient with electrolyte abnormalities.  Discussed possible repeat echo for recent heart failure exacerbation.  Fonda FORBES Rim MD [facility] Phone Number: [phone] 08/01/2024 9:02 AM   "

## 2024-08-01 NOTE — Progress Notes (Signed)
 Mobility Specialist Progress Note;   08/01/24 1223  Mobility  Activity Pivoted/transferred from bed to chair  Level of Assistance Standby assist, set-up cues, supervision of patient - no hands on  Assistive Device Front wheel walker  Distance Ambulated (ft) 10 ft  Activity Response Tolerated well  Mobility Referral Yes  Mobility visit 1 Mobility  Mobility Specialist Start Time (ACUTE ONLY) 1213  Mobility Specialist Stop Time (ACUTE ONLY) 1224  Mobility Specialist Time Calculation (min) (ACUTE ONLY) 11 min   Pt getting up from [facility] upon arrival, agreeable to mobility. Requesting to sit up in chair to eat lunch which was arriving very soon. Pt was able to ambulate around bed to chair w/ SV for safety. On 3LO2 throughout session. Pt left comfortably in chair with all needs met, lunch tray arriving.   Lauraine Erm Mobility Specialist Please contact via SecureChat or [employer] [phone]

## 2024-08-01 NOTE — Progress Notes (Signed)
 " PROGRESS NOTE  Pam [name] FMW:[id number] DOB: 1951/01/18 DOA: 07/24/2024 PCP: Andrew Truman GRADE., MD   LOS: 8 days   Brief Narrative / Interim history: 74 year old female with chronic systolic CHF with EF 35-40%, ICD, CAD, HTN, HLD, PAD, who comes into the hospital with leg pain.  She has known PAD and vascular surgery was consulted, and is now s/p right sided axillobifemoral bypass using 8 mm ringed PTFE & right common femoral endarterectomy on 12/29.  Hospital course complicated by acute on chronic hyponatremia and fluid overload  Subjective / 24h Interval events: Doing well, complains of feeling fluid overloaded in her belly, hands.  She denies any chest pain, she denies any shortness of breath  Assesement and Plan: Principal problem Acute hypoxemic respiratory failure due to acute on chronic combined CHF -she is not on oxygen at baseline, has required it here.  She initially received IV diuresis, but due to ongoing hypotension she was placed on midodrine  and transitioned to oral furosemide .  Her weight appears up, and it was standing.  Suspect fluid overloaded, resume IV furosemide  with continuation of midodrine  - Most recent 2D echo done beginning of December 2025 showed LVEF 35-40%, grade 1 diastolic dysfunction.  RV systolic function and size were normal.  Will repeat a 2D echo given new changes in her health and fluid overload  Active problems Critical limb ischemia -vascular surgery consulted and followed patient while hospitalized.  She is now status post right sided axillobifemoral bypass using 8 mm ringed PTFE & right common femoral endarterectomy on 12/29. - Back on Plavix , continue along with aspirin , continue statin  Hyponatremia -subacute on chronic, her baseline is in the mid upper 120s.  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  Continue furosemide , sodium overall stable and up a little bit today at  121  Hypothyroidism-continue Synthroid   History of COPD-no wheezing, continue inhalers, oxygen as needed  Hypokalemia-continue to monitor and replenish as indicated  Hypotension-continue midodrine  along with diuretics  Scheduled Meds:  aspirin  EC  81 mg Oral Q0600   atorvastatin   80 mg Oral Daily   clopidogrel   75 mg Oral Q0600   digoxin   0.0625 mg Oral Daily   enoxaparin  (LOVENOX ) injection  40 mg Subcutaneous Q24H   feeding supplement  237 mL Oral TID WC   furosemide   40 mg Intravenous Q12H   levothyroxine   37.5 mcg Oral Q0600   midodrine   10 mg Oral TID WC   polyethylene glycol  17 g Oral BID   ranolazine   1,000 mg Oral BID   senna-docusate  1 tablet Oral BID   sodium chloride  flush  3 mL Intravenous Q12H   sodium chloride   1 g Oral BID WC   umeclidinium-vilanterol  1 puff Inhalation Daily   valACYclovir   1,000 mg Oral Daily   Continuous Infusions: PRN Meds:.acetaminophen , calcium  carbonate, hydrALAZINE , HYDROmorphone  (DILAUDID ) injection, labetalol , ondansetron  (ZOFRAN ) IV, oxyCODONE -acetaminophen , perflutren  lipid microspheres (DEFINITY ) IV suspension, phenol, potassium chloride , sodium chloride  flush  Current Outpatient Medications  Medication Instructions   acetaminophen  (TYLENOL ) 1,000 mg, Every 6 hours PRN   albuterol  (VENTOLIN  HFA) 108 (90 Base) MCG/ACT inhaler 2 puffs, Inhalation, Every 6 hours PRN   aspirin  81 mg, Daily   atorvastatin  (LIPITOR ) 80 MG tablet TAKE 1 TABLET(80 MG) BY MOUTH DAILY   bisoprolol  (ZEBETA ) 2.5 mg, Oral, Daily   CALCIUM  PO 1 tablet, Daily   clopidogrel  (PLAVIX ) 75 MG tablet TAKE  1 TABLET(75 MG) BY MOUTH DAILY WITH BREAKFAST   digoxin  (LANOXIN ) 0.0625 mg, Oral, Daily   furosemide  (LASIX ) 20 mg, Oral, As needed   levothyroxine  (SYNTHROID ) 25 MCG tablet Take 37.5 mcg by mouth daily before breakfast.   nitroGLYCERIN  (NITROSTAT ) 0.4 mg, Sublingual, Every 5 min x3 PRN   nystatin cream (MYCOSTATIN) 1 Application, 2 times daily PRN   ranolazine   (RANEXA ) 1000 MG SR tablet TAKE 1 TABLET(1000 MG) BY MOUTH TWICE DAILY   sacubitril -valsartan  (ENTRESTO ) 49-51 MG 1 tablet, Oral, 2 times daily   spironolactone  (ALDACTONE ) 25 MG tablet TAKE 1 TABLET(25 MG) BY MOUTH DAILY   umeclidinium-vilanterol (ANORO ELLIPTA ) 62.5-25 MCG/ACT AEPB 1 puff, Inhalation, Daily   valACYclovir  (VALTREX ) 1,000 mg, Daily   Vitamin D3 2,000 Units, Daily    Diet Orders (From admission, onward)     Start     Ordered   07/30/24 1200  Diet renal/carb modified with fluid restriction Diet-HS Snack? Nothing; Fluid restriction: 1200 mL Fluid; Room service appropriate? Yes; Fluid consistency: Thin  Diet effective now       Question Answer Comment  Diet-HS Snack? Nothing   Fluid restriction: 1200 mL Fluid   Room service appropriate? Yes   Fluid consistency: Thin      07/30/24 1159            DVT prophylaxis: PLACE TED HOSE Start: 07/31/24 1415 enoxaparin  (LOVENOX ) injection 40 mg Start: 07/30/24 1000 SCD's Start: 07/29/24 1604   Lab Results  Component Value Date   PLT 439 (H) 08/01/2024      Code Status: Full Code  Family Communication: Husband at bedside  Status is: Inpatient Remains inpatient appropriate because: Hyponatremia   Level of care: Progressive  Consultants:  Vascular surgery  Objective: Vitals:   08/01/24 0237 08/01/24 0417 08/01/24 0640 08/01/24 0735  BP: (!) 108/46 (!) 113/50  (!) 120/53  Pulse: 69 69 70 70  Resp: 16 15 19 15   Temp: (!) 97.4 F (36.3 C) 98.3 F (36.8 C)  97.6 F (36.4 C)  TempSrc: Oral Oral  Oral  SpO2: 99% 99% 97% 97%  Weight:   57.2 kg   Height:        Intake/Output Summary (Last 24 hours) at 08/01/2024 1149 Last data filed at 08/01/2024 0736 Gross per 24 hour  Intake 600 ml  Output 0 ml  Net 600 ml   Wt Readings from Last 3 Encounters:  08/01/24 57.2 kg  07/23/24 49.9 kg  07/16/24 50.4 kg    Examination:  Constitutional: NAD Eyes: lids and conjunctivae normal, no scleral icterus ENMT:  mmm Neck: normal, supple Respiratory: clear to auscultation bilaterally, no wheezing, no crackles. Normal respiratory effort.  Cardiovascular: Regular rate and rhythm, no murmurs / rubs / gallops. No LE edema. Abdomen: soft, no distention, no tenderness. Bowel sounds positive.   Data Reviewed: I have independently reviewed following labs and imaging studies   CBC Recent Labs  Lab 07/29/24 1145 07/30/24 0515 07/31/24 0427 08/01/24 0817  WBC  --  11.7* 13.7* 12.6*  HGB 11.6* 6.5* 9.6* 9.1*  HCT 34.0* 18.7* 27.0* 26.0*  PLT  --  421* 441* 439*  MCV  --  95.4 91.5 90.6  MCH  --  33.2 32.5 31.7  MCHC  --  34.8 35.6 35.0  RDW  --  14.5 16.7* 15.9*    Recent Labs  Lab 07/29/24 0029 07/29/24 0437 07/29/24 1145 07/30/24 0515 07/31/24 0427 08/01/24 0817  NA 122* 120* 124* 122* 120* 121*  K  4.0 3.4* 3.4* 4.6 4.4 4.1  CL 85* 86*  --  89* 88* 88*  CO2 26 25  --  24 24 23   GLUCOSE 119* 112*  --  133* 106* 112*  BUN 25* 23  --  22 30* 27*  CREATININE 1.00 0.85  --  0.90 1.13* 0.85  CALCIUM  8.7* 8.6*  --  8.1* 8.8* 8.4*  AST 40  --   --   --   --  29  ALT 34  --   --   --   --  13  ALKPHOS 54  --   --   --   --  59  BILITOT 0.3  --   --   --   --  0.4  ALBUMIN 3.8  --   --   --   --  3.3*  MG  --   --   --   --   --  1.9    ------------------------------------------------------------------------------------------------------------------ Recent Labs    07/30/24 0515  CHOL 86  HDL 41  LDLCALC 35  TRIG 50  CHOLHDL 2.1    Lab Results  Component Value Date   HGBA1C 5.4 05/30/2024   ------------------------------------------------------------------------------------------------------------------ No results for input(s): TSH, T4TOTAL, T3FREE, THYROIDAB in the last 72 hours.  Invalid input(s): FREET3  Cardiac Enzymes No results for input(s): CKMB, TROPONINI, MYOGLOBIN in the last 168 hours.  Invalid input(s):  CK ------------------------------------------------------------------------------------------------------------------    Component Value Date/Time   BNP 841.1 (H) 05/29/2024 1206    CBG: Recent Labs  Lab 07/25/24 1159  GLUCAP 95    Recent Results (from the past 240 hours)  Surgical pcr screen     Status: None   Collection Time: 07/23/24  9:15 AM   Specimen: Nasal Mucosa; Nasal Swab  Result Value Ref Range Status   MRSA, PCR NEGATIVE NEGATIVE Final   Staphylococcus aureus NEGATIVE NEGATIVE Final    Comment: (NOTE) The Xpert SA Assay (FDA approved for NASAL specimens in patients 5 years of age and older), is one component of a comprehensive surveillance program. It is not intended to diagnose infection nor to guide or monitor treatment. Performed at [facility], [address], [place], [place] [address]   Resp panel by RT-PCR (RSV, Flu A&B, Covid) Anterior Nasal Swab     Status: None   Collection Time: 07/24/24 10:33 AM   Specimen: Anterior Nasal Swab  Result Value Ref Range Status   SARS Coronavirus 2 by RT PCR NEGATIVE NEGATIVE Final   Influenza A by PCR NEGATIVE NEGATIVE Final   Influenza B by PCR NEGATIVE NEGATIVE Final    Comment: (NOTE) The Xpert Xpress SARS-CoV-2/FLU/RSV plus assay is intended as an aid in the diagnosis of influenza from Nasopharyngeal swab specimens and should not be used as a sole basis for treatment. Nasal washings and aspirates are unacceptable for Xpert Xpress SARS-CoV-2/FLU/RSV testing.  Fact Sheet for Patients: bloggercourse.com  Fact Sheet for Healthcare Providers: seriousbroker.it  This test is not yet approved or cleared by the [place]  FDA and has been authorized for detection and/or diagnosis of SARS-CoV-2 by FDA under an Emergency Use Authorization (EUA). This EUA will remain in effect (meaning this test can be used) for the duration of the COVID-19 declaration  under Section 564(b)(1) of the Act, 21 U.S.C. section 360bbb-3(b)(1), unless the authorization is terminated or revoked.     Resp Syncytial Virus by PCR NEGATIVE NEGATIVE Final    Comment: (NOTE) Fact Sheet for Patients: bloggercourse.com  Fact Sheet for Healthcare Providers: seriousbroker.it  This test is not yet approved or cleared by the [place]  FDA and has been authorized for detection and/or diagnosis of SARS-CoV-2 by FDA under an Emergency Use Authorization (EUA). This EUA will remain in effect (meaning this test can be used) for the duration of the COVID-19 declaration under Section 564(b)(1) of the Act, 21 U.S.C. section 360bbb-3(b)(1), unless the authorization is terminated or revoked.  Performed at [facility], [address] [place], [place] [address]      Radiology Studies: No results found.   Nilda Fendt, MD, PhD [facility]  Between 7 am - 7 pm I am available, please contact me via Amion (for emergencies) or Securechat (non urgent messages)  Between 7 pm - 7 am I am not available, please contact night coverage MD/APP via Amion  "

## 2024-08-02 ENCOUNTER — Inpatient Hospital Stay (HOSPITAL_COMMUNITY)

## 2024-08-02 ENCOUNTER — Other Ambulatory Visit: Payer: Self-pay

## 2024-08-02 DIAGNOSIS — I5023 Acute on chronic systolic (congestive) heart failure: Secondary | ICD-10-CM | POA: Diagnosis not present

## 2024-08-02 DIAGNOSIS — I5020 Unspecified systolic (congestive) heart failure: Secondary | ICD-10-CM | POA: Diagnosis not present

## 2024-08-02 DIAGNOSIS — J9601 Acute respiratory failure with hypoxia: Secondary | ICD-10-CM | POA: Diagnosis not present

## 2024-08-02 LAB — PRO BRAIN NATRIURETIC PEPTIDE: Pro Brain Natriuretic Peptide: 5039 pg/mL — ABNORMAL HIGH

## 2024-08-02 LAB — BASIC METABOLIC PANEL WITH GFR
Anion gap: 7 (ref 5–15)
Anion gap: 9 (ref 5–15)
BUN: 21 mg/dL (ref 8–23)
BUN: 20 mg/dL (ref 8–23)
CO2: 26 mmol/L (ref 22–32)
CO2: 27 mmol/L (ref 22–32)
Calcium: 7.9 mg/dL — ABNORMAL LOW (ref 8.9–10.3)
Calcium: 8.3 mg/dL — ABNORMAL LOW (ref 8.9–10.3)
Chloride: 88 mmol/L — ABNORMAL LOW (ref 98–111)
Chloride: 91 mmol/L — ABNORMAL LOW (ref 98–111)
Creatinine, Ser: 0.88 mg/dL (ref 0.44–1.00)
Creatinine, Ser: 0.75 mg/dL (ref 0.44–1.00)
GFR, Estimated: >60 mL/min
GFR, Estimated: >60 mL/min
Glucose, Bld: 99 mg/dL (ref 70–99)
Glucose, Bld: 137 mg/dL — ABNORMAL HIGH (ref 70–99)
Potassium: 3.9 mmol/L (ref 3.5–5.1)
Potassium: 3.3 mmol/L — ABNORMAL LOW (ref 3.5–5.1)
Sodium: 121 mmol/L — ABNORMAL LOW (ref 135–145)
Sodium: 126 mmol/L — ABNORMAL LOW (ref 135–145)

## 2024-08-02 LAB — PROCALCITONIN: Procalcitonin: <0.1 ng/mL

## 2024-08-02 LAB — COOXEMETRY PANEL
Carboxyhemoglobin: 2.5 % — ABNORMAL HIGH (ref 0.5–1.5)
Methemoglobin: 1 % (ref 0.0–1.5)
O2 Saturation: 51.9 %
Total hemoglobin: 9.1 g/dL — ABNORMAL LOW (ref 12.0–16.0)

## 2024-08-02 LAB — CBC
HCT: 24.3 % — ABNORMAL LOW (ref 36.0–46.0)
Hemoglobin: 8.5 g/dL — ABNORMAL LOW (ref 12.0–15.0)
MCH: 32.1 pg (ref 26.0–34.0)
MCHC: 35 g/dL (ref 30.0–36.0)
MCV: 91.7 fL (ref 80.0–100.0)
Platelets: 385 K/uL (ref 150–400)
RBC: 2.65 MIL/uL — ABNORMAL LOW (ref 3.87–5.11)
RDW: 15.8 % — ABNORMAL HIGH (ref 11.5–15.5)
WBC: 10.1 K/uL (ref 4.0–10.5)
nRBC: 0 % (ref 0.0–0.2)

## 2024-08-02 LAB — RESPIRATORY PANEL BY PCR

## 2024-08-02 LAB — RESP PANEL BY RT-PCR (RSV, FLU A&B, COVID)  RVPGX2
Influenza A by PCR: NEGATIVE
Influenza B by PCR: NEGATIVE
Resp Syncytial Virus by PCR: NEGATIVE
SARS Coronavirus 2 by RT PCR: NEGATIVE

## 2024-08-02 LAB — MAGNESIUM: Magnesium: 1.8 mg/dL (ref 1.7–2.4)

## 2024-08-02 MED ORDER — TOLVAPTAN 15 MG PO TABS
15.0000 mg | ORAL_TABLET | Freq: Once | ORAL | Status: AC
  Administered 2024-08-02: 15 mg via ORAL
  Filled 2024-08-02: qty 1

## 2024-08-02 MED ORDER — SODIUM CHLORIDE 0.9% FLUSH
10.0000 mL | Freq: Two times a day (BID) | INTRAVENOUS | Status: DC
  Administered 2024-08-02 – 2024-08-04 (×5): 10 mL
  Administered 2024-08-05 (×2): 20 mL
  Administered 2024-08-06 – 2024-08-10 (×8): 10 mL
  Administered 2024-08-10: 20 mL
  Administered 2024-08-11 – 2024-08-14 (×7): 10 mL

## 2024-08-02 MED ORDER — FUROSEMIDE 10 MG/ML IJ SOLN: 80.0000 mg | Freq: Two times a day (BID) | INTRAMUSCULAR | Status: DC

## 2024-08-02 MED ORDER — MAGNESIUM SULFATE 2 GM/50ML IV SOLN
2.0000 g | Freq: Once | INTRAVENOUS | Status: AC
  Administered 2024-08-02: 2 g via INTRAVENOUS
  Filled 2024-08-02: qty 50

## 2024-08-02 MED ORDER — SODIUM CHLORIDE 0.9% FLUSH: 10.0000 mL | INTRAVENOUS | Status: DC | PRN

## 2024-08-02 MED ORDER — FUROSEMIDE 10 MG/ML IJ SOLN
80.0000 mg | Freq: Two times a day (BID) | INTRAMUSCULAR | Status: AC
  Administered 2024-08-02: 80 mg via INTRAVENOUS
  Filled 2024-08-02: qty 8

## 2024-08-02 MED ORDER — POTASSIUM CHLORIDE CRYS ER 20 MEQ PO TBCR
40.0000 meq | EXTENDED_RELEASE_TABLET | Freq: Once | ORAL | Status: AC
  Administered 2024-08-02: 40 meq via ORAL
  Filled 2024-08-02: qty 4

## 2024-08-02 MED ORDER — MIDODRINE HCL 5 MG PO TABS
5.0000 mg | ORAL_TABLET | Freq: Three times a day (TID) | ORAL | Status: DC
  Administered 2024-08-02 – 2024-08-05 (×9): 5 mg via ORAL
  Filled 2024-08-02 (×9): qty 1

## 2024-08-02 MED ORDER — CHLORHEXIDINE GLUCONATE CLOTH 2 % EX PADS
6.0000 | MEDICATED_PAD | Freq: Every day | CUTANEOUS | Status: DC
  Administered 2024-08-02 – 2024-08-13 (×12): 6 via TOPICAL

## 2024-08-02 MED ORDER — FUROSEMIDE 10 MG/ML IJ SOLN
60.0000 mg | Freq: Two times a day (BID) | INTRAMUSCULAR | Status: DC
  Administered 2024-08-02: 60 mg via INTRAVENOUS
  Filled 2024-08-02: qty 6

## 2024-08-02 NOTE — Consult Note (Addendum)
 "   Advanced Heart Failure Team Consult Note   Primary Physician: Andrew Truman GRADE., MD Cardiologist:  Lonni Cash, MD HF Cardiologist: Previously Dr. Gardenia, new to Dr. Rolan  HPI:    Pam [name] is seen today for evaluation of acute on chronic systolic CHF at the request of Dr. Vianne with [facility].   Pam [name] is a 74 y.o. female with chronic systolic heart failure secondary to ischemic cardiomyopathy status post ICD, coronary artery disease with CTO of the mid LAD filled with left to left and right to left collaterals. Former smoker, quit 2017.     Her cardiac history dates back to December 2017 when she presented with an NSTEMI.  At that time echocardiogram demonstrated LVEF of 35 to 40% with preserved RV function.  Coronary angiography with heavily calcified CTO of the proximal to mid LAD filled by collaterals and calcified 80% stenosis of the circumflex/OM1.  Follow-up cardiac MRI demonstrated nonviable LAD territory.  She ultimately underwent atherectomy and IVUS guided PCI of the proximal circumflex/OM1.  In November 2020 she had a reduction in her LVEF to 25 to 30% and underwent Medtronic ICD placement in early 2021.    She was admitted in September 2023 with acute on chronic CHF exacerbation.  TTE and left heart cath from admission were stable compared to prior.  Since that time she has been seen in [facility] and later established with Advanced Heart Failure   Dr Gardenia previously met and discussed possibility of LVAD. She had decided to hold off.  She was also not interested in [place].   She was scheduled for outpatient bilateral common femoral enterectomy and a right axillofemoral bypass surgery with VVS d/t ischemic rest pain on R and lifestyle limiting claudication on L.   On pre-op labs she was noted to have drop in Hgb 13>9.5 and significant hyponatremia (132>121). She was sent to ED for further workup and was admitted for acute respiratory failure 2/2  acute on chronic CHF.  IV diuresis attempted pre-op, discontinued due to hypotension. The patient was started on midodrine . She underwent R axillofemoral bypass with PTFE and R CFA endarterectomy on 12/29  Post-op has continued to struggle with volume overload. Has been getting intermittent IV lasix  (doses between 40-60 mg) in addition to sodium chloride  tablets. Repeat echo w/ LVEF 30-35%, RV okay  Advanced Heart Failure asked to see to assist with management of acute on chronic CHF    Home Medications Prior to Admission medications  Medication Sig Start Date End Date Taking? Authorizing Provider  acetaminophen  (TYLENOL ) 500 MG tablet Take 1,000 mg by mouth every 6 (six) hours as needed for moderate pain.   Yes [provider]  albuterol  (VENTOLIN  HFA) 108 (90 Base) MCG/ACT inhaler Inhale 2 puffs into the lungs every 6 (six) hours as needed for wheezing or shortness of breath. 12/28/23  Yes Hope Almarie ORN, NP  aspirin  81 MG chewable tablet Chew 81 mg by mouth daily.   Yes [provider]  atorvastatin  (LIPITOR ) 80 MG tablet TAKE 1 TABLET(80 MG) BY MOUTH DAILY 11/27/18  Yes Cash Lonni BIRCH, MD  bisoprolol  (ZEBETA ) 5 MG tablet Take 0.5 tablets (2.5 mg total) by mouth daily. 06/26/24  Yes Milford, [place], FNP  CALCIUM  PO Take 1 tablet by mouth daily.   Yes [provider]  Cholecalciferol (VITAMIN D3) 50 MCG (2000 UT) TABS Take 2,000 Units by mouth daily.   Yes [provider]  clopidogrel  (PLAVIX ) 75 MG tablet TAKE  1 TABLET(75 MG) BY MOUTH DAILY WITH BREAKFAST 11/27/23  Yes Sabharwal, Aditya, DO  digoxin  (LANOXIN ) 0.125 MG tablet Take 0.5 tablets (0.0625 mg total) by mouth daily. 11/22/23  Yes Milford, Harlene HERO, FNP  furosemide  (LASIX ) 20 MG tablet TAKE 1 TABLET BY MOUTH AS NEEDED 02/27/24  Yes Sabharwal, Aditya, DO  levothyroxine  (SYNTHROID ) 25 MCG tablet Take 37.5 mcg by mouth daily before breakfast. 07/10/23  Yes Thapa, [place], MD  nitroGLYCERIN   (NITROSTAT ) 0.4 MG SL tablet Place 1 tablet (0.4 mg total) under the tongue every 5 (five) minutes x 3 doses as needed for chest pain. 06/14/24  Yes Verlin Lonni BIRCH, MD  nystatin cream (MYCOSTATIN) Apply 1 Application topically 2 (two) times daily as needed (rash). 05/09/22  Yes [provider]  ranolazine  (RANEXA ) 1000 MG SR tablet TAKE 1 TABLET(1000 MG) BY MOUTH TWICE DAILY 02/09/24  Yes Milford, [place], FNP  sacubitril -valsartan  (ENTRESTO ) 49-51 MG Take 1 tablet by mouth 2 (two) times daily. 05/01/24  Yes Clegg, Amy D, NP  spironolactone  (ALDACTONE ) 25 MG tablet TAKE 1 TABLET(25 MG) BY MOUTH DAILY 07/03/23  Yes Sabharwal, Aditya, DO  umeclidinium-vilanterol (ANORO ELLIPTA ) 62.5-25 MCG/ACT AEPB Inhale 1 puff into the lungs daily. 12/28/23  Yes Hope Almarie ORN, NP  valACYclovir  (VALTREX ) 1000 MG tablet Take 1,000 mg by mouth daily.   Yes [provider]    Past Medical History: Past Medical History:  Diagnosis Date   Abnormal EKG    AICD (automatic cardioverter/defibrillator) present    Medtronic   Alcoholic intoxication without complication    Allergy    Cardiomyopathy, ischemic    Cataract    Chest pain 08/29/2016   Chest pain 08/29/2016   CHF (congestive heart failure) (HCC)    Chronic systolic heart failure (HCC) 08/29/2016   Coronary artery disease    Facial laceration    Fall 07/07/2016   Hyperlipidemia    Hypertension 03/03/2017   Hyponatremia    Hypothyroidism 07/08/2016   NSTEMI (non-ST elevated myocardial infarction) (HCC)    Syncope    Thyroid  disease     Past Surgical History: Past Surgical History:  Procedure Laterality Date   AXILLARY-FEMORAL BYPASS GRAFT Bilateral 07/29/2024   Procedure: CREATION, BYPASS, ARTERIAL, AXILLARY TO BILATERAL FEMORAL, USING GRAFT;  Surgeon: Lanis Fonda BRAVO, MD;  Location: [facility] OR;  Service: Vascular;  Laterality: Bilateral;   CARDIAC CATHETERIZATION N/A 07/08/2016   Procedure: Left Heart Cath and Coronary  Angiography;  Surgeon: Lonni Hanson, MD;  Location: [facility] INVASIVE CV LAB;  Service: Cardiovascular;  Laterality: N/A;   CARDIAC CATHETERIZATION N/A 08/29/2016   Procedure: Coronary Atherectomy;  Surgeon: Lonni Hanson, MD;  Location: [facility] INVASIVE CV LAB;  Service: Cardiovascular;  Laterality: N/A;   CARDIAC CATHETERIZATION N/A 08/29/2016   Procedure: Coronary Stent Intervention;  Surgeon: Lonni Hanson, MD;  Location: [facility] INVASIVE CV LAB;  Service: Cardiovascular;  Laterality: N/A;   CATARACT EXTRACTION, BILATERAL     COLONOSCOPY WITH PROPOFOL  N/A 11/18/2021   Procedure: COLONOSCOPY WITH PROPOFOL ;  Surgeon: Eda Iha, MD;  Location: [facility] ENDOSCOPY;  Service: Gastroenterology;  Laterality: N/A;   CORONARY ANGIOPLASTY     coronary stents      ENDARTERECTOMY FEMORAL Bilateral 07/29/2024   Procedure: ENDARTERECTOMY, FEMORAL;  Surgeon: Lanis Fonda BRAVO, MD;  Location: [facility] OR;  Service: Vascular;  Laterality: Bilateral;   HAND SURGERY Right    Dr Alyse Gaba   ICD IMPLANT N/A 08/28/2019   Procedure: ICD IMPLANT;  Surgeon: Inocencio Soyla Lunger, MD;  Location: [facility] INVASIVE  CV LAB;  Service: Cardiovascular;  Laterality: N/A;   LEFT HEART CATH AND CORONARY ANGIOGRAPHY N/A 04/29/2022   Procedure: LEFT HEART CATH AND CORONARY ANGIOGRAPHY;  Surgeon: Anner Alm ORN, MD;  Location: [facility] INVASIVE CV LAB;  Service: Cardiovascular;  Laterality: N/A;   LOWER EXTREMITY ANGIOGRAPHY N/A 06/19/2024   Procedure: Lower Extremity Angiography;  Surgeon: Darron Deatrice LABOR, MD;  Location: [facility] INVASIVE CV LAB;  Service: Cardiovascular;  Laterality: N/A;   PARATHYROIDECTOMY Right 02/02/2021   Procedure: RIGHT SUPERIOR PARATHYROIDECTOMY;  Surgeon: Eletha Boas, MD;  Location: [facility] ORS;  Service: General;  Laterality: Right;   POLYPECTOMY  11/18/2021   Procedure: POLYPECTOMY;  Surgeon: Eda Iha, MD;  Location: [facility] ENDOSCOPY;  Service: Gastroenterology;;   RIGHT HEART CATH N/A 08/11/2022   Procedure: RIGHT  HEART CATH;  Surgeon: Gardenia Led, DO;  Location: [facility] INVASIVE CV LAB;  Service: Cardiovascular;  Laterality: N/A;   VAGINAL HYSTERECTOMY      Family History: Family History  Problem Relation Age of Onset   Kidney failure Mother    Heart disease Mother        CHF   Heart disease Sister    Colon cancer Neg Hx    Esophageal cancer Neg Hx    Rectal cancer Neg Hx    Stomach cancer Neg Hx     Social History: Social History   Socioeconomic History   Marital status: Married    Spouse name: Tefl Teacher   Number of children: 2   Years of education: Not on file   Highest education level: High school graduate  Occupational History   Occupation: Retired-worked for post [employer]  Tobacco Use   Smoking status: Former    Current packs/day: 0.00    Average packs/day: 1 pack/day for 50.0 years (50.0 ttl pk-yrs)    Types: Cigarettes    Start date: 07/07/1966    Quit date: 07/07/2016    Years since quitting: 8.0   Smokeless tobacco: Never  Vaping Use   Vaping status: Never Used  Substance and Sexual Activity   Alcohol use: Not Currently   Drug use: Never   Sexual activity: Not Currently  Other Topics Concern   Not on file  Social History Narrative   Not on file   Social Drivers of Health   Tobacco Use: Medium Risk (07/29/2024)   Patient History    Smoking Tobacco Use: Former    Smokeless Tobacco Use: Never    Passive Exposure: Not on Actuary Strain: Low Risk (04/29/2022)   Overall Financial Resource Strain (CARDIA)    Difficulty of Paying Living Expenses: Not hard at all  Food Insecurity: No Food Insecurity (07/25/2024)   Epic    Worried About Radiation Protection Practitioner of Food in the Last Year: Never true    Ran Out of Food in the Last Year: Never true  Transportation Needs: No Transportation Needs (07/25/2024)   Epic    Lack of Transportation (Medical): No    Lack of Transportation (Non-Medical): No  Physical Activity: Not on file  Stress: Not on file  Social  Connections: Unknown (07/30/2024)   Social Connection and Isolation Panel    Frequency of Communication with Friends and Family: Not on file    Frequency of Social Gatherings with Friends and Family: Not on file    Attends Religious Services: Not on file    Active Member of Clubs or Organizations: Not on file    Attends Banker Meetings: Not on file  Marital Status: Married  Depression (EYV7-0): Not on file  Alcohol Screen: Low Risk (04/29/2022)   Alcohol Screen    Last Alcohol Screening Score (AUDIT): 0  Housing: Unknown (07/25/2024)   Epic    Unable to Pay for Housing in the Last Year: No    Number of Times Moved in the Last Year: Not on file    Homeless in the Last Year: No  Utilities: Not At Risk (07/25/2024)   Epic    Threatened with loss of utilities: No  Health Literacy: Not on file    Allergies:  Allergies[1]  Objective:   Vital Signs:   Temp:  [97.5 F (36.4 C)-98.4 F (36.9 C)] 97.6 F (36.4 C) (01/02 0741) Pulse Rate:  [68-75] 75 (01/02 0741) Resp:  [14-19] 19 (01/02 0741) BP: (106-132)/(45-69) 107/56 (01/02 0741) SpO2:  [94 %-98 %] 97 % (01/02 0741) Weight:  [57.4 kg] 57.4 kg (01/02 0358) Last BM Date : 08/01/24  Weight change: Filed Weights   07/31/24 0554 08/01/24 0640 08/02/24 0358  Weight: 61 kg 57.2 kg 57.4 kg    Intake/Output:   Intake/Output Summary (Last 24 hours) at 08/02/2024 1115 Last data filed at 08/02/2024 0411 Gross per 24 hour  Intake 360 ml  Output 1700 ml  Net -1340 ml    Physical Exam  General:  Cachectic, chronically ill appearing. Cor: Regular rate & rhythm. No murmurs.   Lungs: bibasilar crackles Abdomen: Taut Extremities: R femoral VAC, no edema   Telemetry   SR 70s  Labs   Basic Metabolic Panel: Recent Labs  Lab 07/29/24 0437 07/29/24 1145 07/30/24 0515 07/31/24 0427 08/01/24 0817 08/02/24 0307  NA 120* 124* 122* 120* 121* 121*  K 3.4* 3.4* 4.6 4.4 4.1 3.9  CL 86*  --  89* 88* 88* 88*  CO2 25   --  24 24 23 26   GLUCOSE 112*  --  133* 106* 112* 99  BUN 23  --  22 30* 27* 21  CREATININE 0.85  --  0.90 1.13* 0.85 0.88  CALCIUM  8.6*  --  8.1* 8.8* 8.4* 7.9*  MG  --   --   --   --  1.9 1.8    Liver Function Tests: Recent Labs  Lab 07/29/24 0029 08/01/24 0817  AST 40 29  ALT 34 13  ALKPHOS 54 59  BILITOT 0.3 0.4  PROT 5.9* 5.4*  ALBUMIN 3.8 3.3*   No results for input(s): LIPASE, AMYLASE in the last 168 hours. No results for input(s): AMMONIA in the last 168 hours.  CBC: Recent Labs  Lab 07/29/24 1145 07/30/24 0515 07/31/24 0427 08/01/24 0817 08/02/24 0307  WBC  --  11.7* 13.7* 12.6* 10.1  HGB 11.6* 6.5* 9.6* 9.1* 8.5*  HCT 34.0* 18.7* 27.0* 26.0* 24.3*  MCV  --  95.4 91.5 90.6 91.7  PLT  --  421* 441* 439* 385    Cardiac Enzymes: No results for input(s): CKTOTAL, CKMB, CKMBINDEX, TROPONINI in the last 168 hours.  BNP: BNP (last 3 results) Recent Labs    11/22/23 1129 03/26/24 1423 05/29/24 1206  BNP 472.0* 614.1* 841.1*    ProBNP (last 3 results) Recent Labs    07/24/24 1016  PROBNP 4,537.0*     CBG: No results for input(s): GLUCAP in the last 168 hours.  Coagulation Studies: No results for input(s): LABPROT, INR in the last 72 hours.   Imaging   No results found.  Medications:   Current Medications:  aspirin  EC  81 mg  Oral Q0600   atorvastatin   80 mg Oral Daily   clopidogrel   75 mg Oral Q0600   digoxin   0.0625 mg Oral Daily   enoxaparin  (LOVENOX ) injection  40 mg Subcutaneous Q24H   feeding supplement  237 mL Oral TID WC   furosemide   60 mg Intravenous Q12H   levothyroxine   37.5 mcg Oral Q0600   midodrine   10 mg Oral TID WC   polyethylene glycol  17 g Oral BID   ranolazine   1,000 mg Oral BID   senna-docusate  1 tablet Oral BID   sodium chloride  flush  3 mL Intravenous Q12H   umeclidinium-vilanterol  1 puff Inhalation Daily   valACYclovir   1,000 mg Oral Daily    Infusions:  Patient Profile   Pam [name] is a 74 y.o. female with history of chronic HFrEF/mixed cardiomyopathy, CAD, PAD, chronic hyponatremia, COPD.   Admitted with acute respiratory failure, acute on chronic CHF and hyponatremia.  Assessment/Plan   Chronic HFrEF, Stage D, End stage  - Secondary to ischemic cardiomyopathy with nonviable myocardium - Has ICD - RHC 1/24: RA 1, PA mean 14, PCWP mean 5, Fick CO/CI 2.87/1.89 - CPX w/ VE/VCO2 slop of 69.4 and PVO2 of 13.2  - Previously declined advanced therapies and BAT wire - Echo this admit, EF 30-35%, RWMA, RV okay, moderate BAE - NYHA III, confounded by deconditioning and claudication symptoms - Volume overloaded but not sure this entirely explains her dyspnea. ? COPD playing a role. Increase lasix  to 80 mg BID.  - Give 15 mg tolvaptan - Decrease midodrine  to 5 mg TID, try to wean off soon - Slowly add back GDMT - Continue digoxin  0.0625 mg daily  Pleural effusion - Large on echo - Check CXR   Acute respiratory failure  - Currently on 4L [place] - Does not typically require O2 at home - PCT okay on admission - Chest x-ray as above to look at pleural effusion - Followed by pulmonology for COPD; on outpatient inhalers.   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  - Na low 120s since admission - Diuresing. Giving tolvaptan as above  Anemia - 1 u RBCs post-op - Hgb 8.5 today   Length of Stay: 9  FINCH, LINDSAY N, PA-C  08/02/2024, 11:15 AM  Advanced Heart Failure Team Pager [phone] (M-F; 7a - 5p)   Please visit [email or web address]: For overnight coverage please call cardiology fellow first. If fellow not  available call Shock/ECMO MD on call.  For ECMO / Mechanical Support (Impella, IABP, LVAD) issues call Shock / ECMO MD on call.    Patient seen with NP, I formulated the plan and agree with the above note.   74 y.o. with history of CAD, ischemic cardiomyopathy/Medtronic ICD, COPD, PAD, chronic hyponatremia.  She developed ischemic pain in the right leg and is now s/p right axillofemoral bypass and right CFA endarterectomy.  She has developed post-op respiratory failure and is on 4L oxygen (not on oxygen at home).  She also is noted to be markedly hyponatremic.  Has hyponatremia at baseline and has been followed for  this by endocrinology, but is significantly worse than baseline this admission (121 today).   General: NAD, frail Neck: JVP difficult, ?8, no thyromegaly or thyroid  nodule.  Lungs: Rhonchi bilaterally CV: Nondisplaced PMI.  Heart regular S1/S2, no S3/S4, no murmur.  No peripheral edema.  No carotid bruit.  Difficult to palpate pedal pulses.  Abdomen: Soft, nontender, no hepatosplenomegaly, no distention.  Skin: Intact without lesions or rashes.  Neurologic: Alert and oriented x 3.  Psych: Normal affect. Extremities: No clubbing or cyanosis.  HEENT: Normal.   1. Acute hypoxemic respiratory failure: Patient is a prior smoker with h/o COPD but has not been on home oxygen.  Patient is requiring 4L oxygen by [place] currently.  Exam is difficult for volume, but IVC did not appear particularly dilated on echo yesterday. She is severely hyponatremic, Na 121 today.  Lung exam is rhonchorous.  I wonder if she does not have viral vs bacterial PNA/COPD exacerbation.   - Will try diuresing but see discussion below.  - PA/lateral CXR to assess for PNA, check procalcitonin.  - Send respiratory virus panel, COVID/flu.  - Low threshold for antibiotic/steroid coverage for COPD exacerbation.  2. Acute on chronic systolic CHF: Ischemic cardiomyopathy, has MDT ICD.  Echo this admission with EF 30-35%, WMAs,  normal RV, IVC not dilated.  Exam is difficult for volume, lungs are crackly/rhonchorous though and she has a new oxygen requirement. She was started on midodrine  but I do not see where her BP was ever worrisomely low (few SBPs in 90s).  - Place PICC to follow CVP and check pro-BNP.  - I will empirically treat with Lasix  80 mg IV bid x 2 doses.  - Decrease midodrine  to 5 mg tid and stop if BP remains stable.  - Continue digoxin  0.0625 - May need full RHC on Monday, will see how her course proceeds.  3. CAD: NSTEMI 12/27, CTO LAD with collaterals and severe pLCx/OM1 stenosis on cath.  She had PCI to pLCx and OM1.  No chest pain.  - Continue ASA 81/Plavix  - Continue statin.  - Continue ranolazine  4. Hyponatremia: This appears chronic, Na in upper 120s -130 range generally, has been seen by endocrinology in the past.  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  Na now down to 121, has been around 120 this entire hospitalization.  Exam difficult for volume, ?additional component of hypervolemic hyponatremia this admission vs worsening SIADH due to COPD exacerbation/lung infection.  - Fluid restrict.   - As above, will give Lasix  x 2 doses.  - Will give tolvaptan 15 mg x 1.  - Place PICC to assess volume status.  5. PAD: S/p R sided axillobifemoral bypass using PTFE and R CFA endarterectomy this admission.  - ASA/Plavix  - Statin 6. Post-op anemia  Ezra Shuck 08/02/2024 1:32 PM     [1]  Allergies Allergen Reactions   Lisinopril  Cough    Dry cough   "

## 2024-08-02 NOTE — Progress Notes (Signed)
 " PROGRESS NOTE  Pam [name] FMW:[id number] DOB: [birth year] DOA: 07/24/2024 PCP: Andrew Truman GRADE., MD   LOS: 9 days   Brief Narrative / Interim history: 74 year old female with chronic systolic CHF with EF 35-40%, ICD, CAD, HTN, HLD, PAD, who comes into the hospital with leg pain.  She has known PAD and vascular surgery was consulted, and is now s/p right sided axillobifemoral bypass using 8 mm ringed PTFE & right common femoral endarterectomy on 12/29.  Hospital course complicated by acute on chronic hyponatremia and fluid overload  Subjective / 24h Interval events: She got up and went to the bathroom and back, became pretty dyspneic but recovering as she is now back in bed.  Assesement and Plan: Principal problem Acute hypoxemic respiratory failure due to acute on chronic combined CHF -she is not on oxygen at baseline, has required it here.  She initially received IV diuresis, but due to ongoing hypotension she was placed on midodrine  and transitioned to oral furosemide .  Her weight appears up, and it was standing.  Suspect fluid overloaded, resume IV furosemide  with continuation of midodrine  - Most recent 2D echo done beginning of December 2025 showed LVEF 35-40%, grade 1 diastolic dysfunction.  RV systolic function and size were normal.  Repeat 2D echo done 1/1 showed LVEF 30-35%, with wall motion abnormalities.  RV systolic function was normal in size with slightly enlarged - Not really losing any weight, midodrine  was increased yesterday, will go ahead and increase furosemide  dose today.  She was supposed to see advanced heart failure as an outpatient, cardiology consulted due to tenuous status, appreciate input  Active problems Critical limb ischemia -vascular surgery consulted and followed patient while hospitalized.  She is now status post right sided axillobifemoral bypass using 8 mm ringed PTFE & right common femoral endarterectomy on 12/29. - Back on Plavix , continue along with  aspirin , continue statin  Hyponatremia -subacute on chronic, her baseline is in the mid upper 120s.  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  Furosemide , increased dose today, sodium stable at 121  Hypothyroidism-continue Synthroid   History of COPD-no wheezing, continue inhalers, oxygen as needed  Hypokalemia-continue to monitor and replenish as indicated  Hypotension-continue midodrine  along with diuretics  Scheduled Meds:  aspirin  EC  81 mg Oral Q0600   atorvastatin   80 mg Oral Daily   clopidogrel   75 mg Oral Q0600   digoxin   0.0625 mg Oral Daily   enoxaparin  (LOVENOX ) injection  40 mg Subcutaneous Q24H   feeding supplement  237 mL Oral TID WC   furosemide   60 mg Intravenous Q12H   levothyroxine   37.5 mcg Oral Q0600   midodrine   10 mg Oral TID WC   polyethylene glycol  17 g Oral BID   ranolazine   1,000 mg Oral BID   senna-docusate  1 tablet Oral BID   sodium chloride  flush  3 mL Intravenous Q12H   sodium chloride   1 g Oral BID WC   umeclidinium-vilanterol  1 puff Inhalation Daily   valACYclovir   1,000 mg Oral Daily   Continuous Infusions: PRN Meds:.acetaminophen , calcium  carbonate, hydrALAZINE , HYDROmorphone  (DILAUDID ) injection, labetalol , ondansetron  (ZOFRAN ) IV, oxyCODONE -acetaminophen , phenol, potassium chloride , sodium chloride  flush  Current Outpatient Medications  Medication Instructions   acetaminophen  (TYLENOL ) 1,000 mg, Every 6 hours PRN   albuterol  (VENTOLIN  HFA) 108 (90 Base) MCG/ACT inhaler 2 puffs, Inhalation, Every 6 hours PRN   aspirin  81 mg, Daily  atorvastatin  (LIPITOR ) 80 MG tablet TAKE 1 TABLET(80 MG) BY MOUTH DAILY   bisoprolol  (ZEBETA ) 2.5 mg, Oral, Daily   CALCIUM  PO 1 tablet, Daily   clopidogrel  (PLAVIX ) 75 MG tablet TAKE 1 TABLET(75 MG) BY MOUTH DAILY WITH BREAKFAST   digoxin  (LANOXIN ) 0.0625 mg, Oral, Daily   furosemide  (LASIX ) 20 mg, Oral, As needed   levothyroxine   (SYNTHROID ) 25 MCG tablet Take 37.5 mcg by mouth daily before breakfast.   nitroGLYCERIN  (NITROSTAT ) 0.4 mg, Sublingual, Every 5 min x3 PRN   nystatin cream (MYCOSTATIN) 1 Application, 2 times daily PRN   ranolazine  (RANEXA ) 1000 MG SR tablet TAKE 1 TABLET(1000 MG) BY MOUTH TWICE DAILY   sacubitril -valsartan  (ENTRESTO ) 49-51 MG 1 tablet, Oral, 2 times daily   spironolactone  (ALDACTONE ) 25 MG tablet TAKE 1 TABLET(25 MG) BY MOUTH DAILY   umeclidinium-vilanterol (ANORO ELLIPTA ) 62.5-25 MCG/ACT AEPB 1 puff, Inhalation, Daily   valACYclovir  (VALTREX ) 1,000 mg, Daily   Vitamin D3 2,000 Units, Daily    Diet Orders (From admission, onward)     Start     Ordered   07/30/24 1200  Diet renal/carb modified with fluid restriction Diet-HS Snack? Nothing; Fluid restriction: 1200 mL Fluid; Room service appropriate? Yes; Fluid consistency: Thin  Diet effective now       Question Answer Comment  Diet-HS Snack? Nothing   Fluid restriction: 1200 mL Fluid   Room service appropriate? Yes   Fluid consistency: Thin      07/30/24 1159            DVT prophylaxis: PLACE TED HOSE Start: 07/31/24 1415 enoxaparin  (LOVENOX ) injection 40 mg Start: 07/30/24 1000 SCD's Start: 07/29/24 1604   Lab Results  Component Value Date   PLT 385 08/02/2024      Code Status: Full Code  Family Communication: Husband at bedside  Status is: Inpatient Remains inpatient appropriate because: Hyponatremia   Level of care: Progressive  Consultants:  Vascular surgery  Objective: Vitals:   08/02/24 0014 08/02/24 0358 08/02/24 0406 08/02/24 0741  BP: (!) 106/49  123/60 (!) 107/56  Pulse: 70  68 75  Resp: 14  18 19   Temp: 97.8 F (36.6 C)  97.6 F (36.4 C) 97.6 F (36.4 C)  TempSrc: Oral  Oral Oral  SpO2: 95%  97% 97%  Weight:  57.4 kg    Height:        Intake/Output Summary (Last 24 hours) at 08/02/2024 1050 Last data filed at 08/02/2024 0411 Gross per 24 hour  Intake 360 ml  Output 1860 ml  Net -1500 ml    Wt Readings from Last 3 Encounters:  08/02/24 57.4 kg  07/23/24 49.9 kg  07/16/24 50.4 kg    Examination:  Constitutional: NAD Eyes: lids and conjunctivae normal, no scleral icterus ENMT: mmm Neck: normal, supple Respiratory: Faint bibasilar crackles Cardiovascular: Regular rate and rhythm, no murmurs / rubs / gallops.  Trace LE edema. Abdomen: soft, very mild distention, no tenderness. Bowel sounds positive.  Skin: no rashes Neurologic: no focal deficits, equal strength  Data Reviewed: I have independently reviewed following labs and imaging studies   CBC Recent Labs  Lab 07/29/24 1145 07/30/24 0515 07/31/24 0427 08/01/24 0817 08/02/24 0307  WBC  --  11.7* 13.7* 12.6* 10.1  HGB 11.6* 6.5* 9.6* 9.1* 8.5*  HCT 34.0* 18.7* 27.0* 26.0* 24.3*  PLT  --  421* 441* 439* 385  MCV  --  95.4 91.5 90.6 91.7  MCH  --  33.2 32.5 31.7 32.1  MCHC  --  34.8 35.6 35.0 35.0  RDW  --  14.5 16.7* 15.9* 15.8*    Recent Labs  Lab 07/29/24 0029 07/29/24 0437 07/29/24 1145 07/30/24 0515 07/31/24 0427 08/01/24 0817 08/02/24 0307  NA 122* 120* 124* 122* 120* 121* 121*  K 4.0 3.4* 3.4* 4.6 4.4 4.1 3.9  CL 85* 86*  --  89* 88* 88* 88*  CO2 26 25  --  24 24 23 26   GLUCOSE 119* 112*  --  133* 106* 112* 99  BUN 25* 23  --  22 30* 27* 21  CREATININE 1.00 0.85  --  0.90 1.13* 0.85 0.88  CALCIUM  8.7* 8.6*  --  8.1* 8.8* 8.4* 7.9*  AST 40  --   --   --   --  29  --   ALT 34  --   --   --   --  13  --   ALKPHOS 54  --   --   --   --  59  --   BILITOT 0.3  --   --   --   --  0.4  --   ALBUMIN 3.8  --   --   --   --  3.3*  --   MG  --   --   --   --   --  1.9 1.8    ------------------------------------------------------------------------------------------------------------------ No results for input(s): CHOL, HDL, LDLCALC, TRIG, CHOLHDL, LDLDIRECT in the last 72 hours.   Lab Results  Component Value Date   HGBA1C 5.4 05/30/2024    ------------------------------------------------------------------------------------------------------------------ No results for input(s): TSH, T4TOTAL, T3FREE, THYROIDAB in the last 72 hours.  Invalid input(s): FREET3  Cardiac Enzymes No results for input(s): CKMB, TROPONINI, MYOGLOBIN in the last 168 hours.  Invalid input(s): CK ------------------------------------------------------------------------------------------------------------------    Component Value Date/Time   BNP 841.1 (H) 05/29/2024 1206    CBG: No results for input(s): GLUCAP in the last 168 hours.   Recent Results (from the past 240 hours)  Resp panel by RT-PCR (RSV, Flu A&B, Covid) Anterior Nasal Swab     Status: None   Collection Time: 07/24/24 10:33 AM   Specimen: Anterior Nasal Swab  Result Value Ref Range Status   SARS Coronavirus 2 by RT PCR NEGATIVE NEGATIVE Final   Influenza A by PCR NEGATIVE NEGATIVE Final   Influenza B by PCR NEGATIVE NEGATIVE Final    Comment: (NOTE) The Xpert Xpress SARS-CoV-2/FLU/RSV plus assay is intended as an aid in the diagnosis of influenza from Nasopharyngeal swab specimens and should not be used as a sole basis for treatment. Nasal washings and aspirates are unacceptable for Xpert Xpress SARS-CoV-2/FLU/RSV testing.  Fact Sheet for Patients: bloggercourse.com  Fact Sheet for Healthcare Providers: seriousbroker.it  This test is not yet approved or cleared by the [place]  FDA and has been authorized for detection and/or diagnosis of SARS-CoV-2 by FDA under an Emergency Use Authorization (EUA). This EUA will remain in effect (meaning this test can be used) for the duration of the COVID-19 declaration under Section 564(b)(1) of the Act, 21 U.S.C. section 360bbb-3(b)(1), unless the authorization is terminated or revoked.     Resp Syncytial Virus by PCR NEGATIVE NEGATIVE Final    Comment:  (NOTE) Fact Sheet for Patients: bloggercourse.com  Fact Sheet for Healthcare Providers: seriousbroker.it  This test is not yet approved or cleared by the [place]  FDA and has been authorized for detection and/or diagnosis of SARS-CoV-2 by FDA under an Emergency Use Authorization (EUA). This EUA will  remain in effect (meaning this test can be used) for the duration of the COVID-19 declaration under Section 564(b)(1) of the Act, 21 U.S.C. section 360bbb-3(b)(1), unless the authorization is terminated or revoked.  Performed at [facility], [address] [place], [place] [address]      Radiology Studies: No results found.   Nilda Fendt, MD, PhD [facility]  Between 7 am - 7 pm I am available, please contact me via Amion (for emergencies) or Securechat (non urgent messages)  Between 7 pm - 7 am I am not available, please contact night coverage MD/APP via Amion  "

## 2024-08-02 NOTE — TOC Initial Note (Signed)
 Transition of Care [facility]) - Initial/Assessment Note    Patient Details  Name: Pam [name] MRN: [id number] Date of Birth: September 01, 1950  Transition of Care [facility] CM/SW Contact:    Arlana JINNY Nicholaus ISRAEL Phone Number: [phone] 08/02/2024, 4:02 PM  Clinical Narrative:    3:23 PM- HF CSW attempted to meet with patient at bedside. Patient was with nursing staff in the room. Nures  stated that she was in the restroom. CSW/CM will follow up at a more appropriate time .   HF CSW/CM will continue to follow and monitor for dc readiness.                      Patient Goals and CMS Choice            Expected Discharge Plan and Services                                              Prior Living Arrangements/Services                       Activities of Daily Living   ADL Screening (condition at time of admission) Independently performs ADLs?: Yes (appropriate for developmental age) Is the patient deaf or have difficulty hearing?: Yes Does the patient have difficulty seeing, even when wearing glasses/contacts?: No Does the patient have difficulty concentrating, remembering, or making decisions?: No  Permission Sought/Granted                  Emotional Assessment              Admission diagnosis:  Chronic systolic heart failure (HCC) [I50.22] Hyponatremia [E87.1] Peripheral vascular disease [I73.9] Hypothyroidism, adult [E03.9] Hyperlipidemia LDL goal <70 [E78.5] Acute respiratory failure with hypoxia (HCC) [J96.01] Centrilobular emphysema (HCC) [J43.2] Heart failure with reduced ejection fraction (HCC) [I50.20] 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] Acute congestive heart failure, unspecified heart failure type (HCC) [I50.9] Acute on chronic congestive heart failure, unspecified heart failure type (HCC) [I50.9] Limb ischemia [I99.8] Patient Active Problem List   Diagnosis Date Noted    Limb ischemia 07/29/2024   Heart failure with reduced ejection fraction (HCC) 07/24/2024   Pressure injury of skin 07/24/2024   Critical limb ischemia of right lower extremity (HCC) 07/24/2024   COPD (chronic obstructive pulmonary disease) (HCC) 07/24/2024   History of primary hyperparathyroidism 05/31/2023   History of parathyroid  surgery 05/31/2023   Stage 3a chronic kidney disease (HCC) 12/08/2022   HFrEF (heart failure with reduced ejection fraction) (HCC)    Shortness of breath    Acute on chronic systolic CHF (congestive heart failure) (HCC) 04/27/2022   Elevated troponin 04/27/2022   Sepsis (HCC) 04/27/2022   CAP (community acquired pneumonia) 04/27/2022   Thrombocytosis 04/27/2022   Acute respiratory failure with hypoxia (HCC) 04/26/2022   History of colonic polyps    Adenomatous polyp of ascending colon    Hyperparathyroidism, primary 02/02/2021   Primary hyperparathyroidism 02/02/2021   Osteoporosis without current pathological fracture 10/09/2019   Palpitations 08/28/2017   Essential hypertension 03/03/2017   Chest pain 08/29/2016   Chronic systolic heart failure (HCC) 08/29/2016   Cardiomyopathy, ischemic    Hyponatremia    Facial laceration    Coronary artery disease of native artery of native heart with stable angina pectoris  Hyperlipidemia LDL goal <70 07/08/2016   Hypothyroidism 07/08/2016   Alcoholic intoxication without complication    Fall 07/07/2016   Abnormal EKG    PCP:  Andrew Truman GRADE., MD Pharmacy:   [facility] [phone] [address], [place] - [address] AT [facility] [address] [place] [address] Phone: [phone] Fax: [phone]     Social Drivers of Health (SDOH) Social History: SDOH Screenings   Food Insecurity: No Food Insecurity (07/25/2024)  Housing: Unknown (07/25/2024)  Transportation Needs: No Transportation Needs (07/25/2024)  Utilities: Not At Risk (07/25/2024)  Alcohol Screen:  Low Risk (04/29/2022)  Financial Resource Strain: Low Risk (04/29/2022)  Social Connections: Unknown (07/30/2024)  Tobacco Use: Medium Risk (07/29/2024)   SDOH Interventions:     Readmission Risk Interventions     No data to display

## 2024-08-02 NOTE — Progress Notes (Addendum)
" °  Progress Note    08/02/2024 7:37 AM 4 Days Post-Op  Subjective: Says she is feeling okay, eating breakfast   Vitals:   08/02/24 0014 08/02/24 0406  BP: (!) 106/49 123/60  Pulse: 70 68  Resp: 14 18  Temp: 97.8 F (36.6 C) 97.6 F (36.4 C)  SpO2: 95% 97%    Physical Exam: General: Sitting up in bed, NAD Cardiac: Regular Lungs: Nonlabored Incisions:  Left groin incision c/d/l. R groin with prevena with good seal. R infraclavicular incision c/d/l Extremities:  BLE warm and well perfused with brisk DP/PT doppler signals Abdomen:  soft, mildly tender around lower abdomen  CBC    Component Value Date/Time   WBC 10.1 08/02/2024 0307   RBC 2.65 (L) 08/02/2024 0307   HGB 8.5 (L) 08/02/2024 0307   HGB 13.6 06/11/2024 1039   HCT 24.3 (L) 08/02/2024 0307   HCT 39.0 06/11/2024 1039   PLT 385 08/02/2024 0307   PLT 335 06/11/2024 1039   MCV 91.7 08/02/2024 0307   MCV 102 (H) 06/11/2024 1039   MCH 32.1 08/02/2024 0307   MCHC 35.0 08/02/2024 0307   RDW 15.8 (H) 08/02/2024 0307   RDW 13.1 06/11/2024 1039   LYMPHSABS 1.4 07/25/2024 0253   LYMPHSABS 2.9 08/25/2016 1648   MONOABS 1.2 (H) 07/25/2024 0253   EOSABS 0.0 07/25/2024 0253   EOSABS 0.2 08/25/2016 1648   BASOSABS 0.0 07/25/2024 0253   BASOSABS 0.0 08/25/2016 1648    BMET    Component Value Date/Time   NA 121 (L) 08/02/2024 0307   NA 132 (L) 06/11/2024 1039   K 3.9 08/02/2024 0307   CL 88 (L) 08/02/2024 0307   CO2 26 08/02/2024 0307   GLUCOSE 99 08/02/2024 0307   BUN 21 08/02/2024 0307   BUN 15 06/11/2024 1039   CREATININE 0.88 08/02/2024 0307   CREATININE 1.13 (H) 05/30/2024 0851   CALCIUM  7.9 (L) 08/02/2024 0307   GFRNONAA >60 08/02/2024 0307   GFRAA 78 08/19/2019 0830    INR    Component Value Date/Time   INR 1.1 07/24/2024 1016     Intake/Output Summary (Last 24 hours) at 08/02/2024 0737 Last data filed at 08/02/2024 0411 Gross per 24 hour  Intake 360 ml  Output 1860 ml  Net -1500 ml       Assessment/Plan:  74 y.o. female is 4 days post op, s/p: Right sided axillobifemoral bypass using 8 mm ringed PTFE and Right common femoral endarterectomy   - She says she is doing okay this morning, denies any pain -Right infraclavicular incision intact and dry.  Left groin incision intact without hematoma.  Right groin with Prevena VAC with good seal, remain on until 1/5 -Bilateral lower extremities well-perfused with brisk DP/PT Doppler signals -Encouraged mobility with PT/OT   Ahmed Holster, PA-C [facility] [phone] 08/02/2024 7:37 AM  VASCULAR STAFF ADDENDUM: I have independently interviewed and examined the patient. I agree with the above.  Doing great from a vascular surgery standpoint Continues to work on nutrition.  Patient failure to thrive on arrival.  I think that she will need SNF. Working on diuresis which has been challenging with her blood pressure. VAC to come off the right groin Monday  Fonda FORBES Rim MD [facility] Phone Number: [phone] 08/02/2024 7:49 AM    "

## 2024-08-02 NOTE — Progress Notes (Signed)
 Peripherally Inserted Central Catheter Placement  The IV Nurse has discussed with the patient and/or persons authorized to consent for the patient, the purpose of this procedure and the potential benefits and risks involved with this procedure.  The benefits include less needle sticks, lab draws from the catheter, and the patient may be discharged home with the catheter. Risks include, but not limited to, infection, bleeding, blood clot (thrombus formation), and puncture of an artery; nerve damage and irregular heartbeat and possibility to perform a PICC exchange if needed/ordered by physician.  Alternatives to this procedure were also discussed.  Bard Power PICC patient education guide, fact sheet on infection prevention and patient information card has been provided to patient /or left at bedside.    PICC Placement Documentation  PICC Double Lumen 08/02/24 Right Brachial 36 cm 1 cm (Active)  Indication for Insertion or Continuance of Line Prolonged intravenous therapies 08/02/24 1647  Exposed Catheter (cm) 1 cm 08/02/24 1647  Site Assessment Clean, Dry, Intact 08/02/24 1647  Lumen #1 Status Flushed;Blood return noted;Saline locked 08/02/24 1647  Lumen #2 Status Flushed;Blood return noted;Saline locked 08/02/24 1647  Dressing Type Transparent 08/02/24 1647  Dressing Status Antimicrobial disc/dressing in place 08/02/24 1647  Line Care Connections checked and tightened 08/02/24 1647  Line Adjustment (NICU/IV Team Only) No 08/02/24 1647  Dressing Intervention New dressing 08/02/24 1647  Dressing Change Due 08/09/24 08/02/24 1647       Pam [name] 08/02/2024, 4:49 PM

## 2024-08-03 DIAGNOSIS — I5020 Unspecified systolic (congestive) heart failure: Secondary | ICD-10-CM | POA: Diagnosis not present

## 2024-08-03 LAB — COMPREHENSIVE METABOLIC PANEL WITH GFR
ALT: 13 U/L (ref 0–44)
AST: 28 U/L (ref 15–41)
Albumin: 3.1 g/dL — ABNORMAL LOW (ref 3.5–5.0)
Alkaline Phosphatase: 57 U/L (ref 38–126)
Anion gap: 8 (ref 5–15)
BUN: 18 mg/dL (ref 8–23)
CO2: 27 mmol/L (ref 22–32)
Calcium: 8.5 mg/dL — ABNORMAL LOW (ref 8.9–10.3)
Chloride: 92 mmol/L — ABNORMAL LOW (ref 98–111)
Creatinine, Ser: 0.75 mg/dL (ref 0.44–1.00)
GFR, Estimated: >60 mL/min
Glucose, Bld: 102 mg/dL — ABNORMAL HIGH (ref 70–99)
Potassium: 3.7 mmol/L (ref 3.5–5.1)
Sodium: 128 mmol/L — ABNORMAL LOW (ref 135–145)
Total Bilirubin: 0.4 mg/dL (ref 0.0–1.2)
Total Protein: 5.1 g/dL — ABNORMAL LOW (ref 6.5–8.1)

## 2024-08-03 LAB — CBC
HCT: 24 % — ABNORMAL LOW (ref 36.0–46.0)
Hemoglobin: 8.2 g/dL — ABNORMAL LOW (ref 12.0–15.0)
MCH: 31.2 pg (ref 26.0–34.0)
MCHC: 34.2 g/dL (ref 30.0–36.0)
MCV: 91.3 fL (ref 80.0–100.0)
Platelets: 395 K/uL (ref 150–400)
RBC: 2.63 MIL/uL — ABNORMAL LOW (ref 3.87–5.11)
RDW: 15.8 % — ABNORMAL HIGH (ref 11.5–15.5)
WBC: 9.7 K/uL (ref 4.0–10.5)
nRBC: 0 % (ref 0.0–0.2)

## 2024-08-03 LAB — COOXEMETRY PANEL
Carboxyhemoglobin: 2.8 % — ABNORMAL HIGH (ref 0.5–1.5)
Methemoglobin: 1.2 % (ref 0.0–1.5)
O2 Saturation: 56.3 %
Total hemoglobin: 8.4 g/dL — ABNORMAL LOW (ref 12.0–16.0)

## 2024-08-03 LAB — DIGOXIN LEVEL: Digoxin Level: 0.9 ng/mL (ref 0.8–2.0)

## 2024-08-03 LAB — MAGNESIUM: Magnesium: 2.1 mg/dL (ref 1.7–2.4)

## 2024-08-03 MED ORDER — IPRATROPIUM-ALBUTEROL 0.5-2.5 (3) MG/3ML IN SOLN
3.0000 mL | RESPIRATORY_TRACT | Status: DC | PRN
  Administered 2024-08-04 – 2024-08-05 (×7): 3 mL via RESPIRATORY_TRACT
  Filled 2024-08-03 (×7): qty 3

## 2024-08-03 MED ORDER — POTASSIUM CHLORIDE CRYS ER 20 MEQ PO TBCR
30.0000 meq | EXTENDED_RELEASE_TABLET | Freq: Once | ORAL | Status: AC
  Administered 2024-08-03: 30 meq via ORAL
  Filled 2024-08-03: qty 1

## 2024-08-03 MED ORDER — FUROSEMIDE 10 MG/ML IJ SOLN
80.0000 mg | Freq: Once | INTRAMUSCULAR | Status: AC
  Administered 2024-08-03: 80 mg via INTRAVENOUS
  Filled 2024-08-03: qty 8

## 2024-08-03 NOTE — Progress Notes (Addendum)
" °  Progress Note    08/03/2024 9:08 AM 5 Days Post-Op  Subjective: Says she feels okay, sore at her groin incisions   Vitals:   08/03/24 0406 08/03/24 0714  BP: (!) 131/51 115/66  Pulse: 80 74  Resp: 16 16  Temp: 98.1 F (36.7 C) 97.6 F (36.4 C)  SpO2: 100% 96%    Physical Exam: General: Sitting up in bed, NAD Cardiac: Regular Lungs: Nonlabored, on supplemental oxygen Incisions: Right infraclavicular and left groin incisions intact and dry.  Right groin with Prevena VAC with good seal Extremities: BLE warm and well-perfused with brisk DP/PT Doppler signals  CBC    Component Value Date/Time   WBC 9.7 08/03/2024 0406   RBC 2.63 (L) 08/03/2024 0406   HGB 8.2 (L) 08/03/2024 0406   HGB 13.6 06/11/2024 1039   HCT 24.0 (L) 08/03/2024 0406   HCT 39.0 06/11/2024 1039   PLT 395 08/03/2024 0406   PLT 335 06/11/2024 1039   MCV 91.3 08/03/2024 0406   MCV 102 (H) 06/11/2024 1039   MCH 31.2 08/03/2024 0406   MCHC 34.2 08/03/2024 0406   RDW 15.8 (H) 08/03/2024 0406   RDW 13.1 06/11/2024 1039   LYMPHSABS 1.4 07/25/2024 0253   LYMPHSABS 2.9 08/25/2016 1648   MONOABS 1.2 (H) 07/25/2024 0253   EOSABS 0.0 07/25/2024 0253   EOSABS 0.2 08/25/2016 1648   BASOSABS 0.0 07/25/2024 0253   BASOSABS 0.0 08/25/2016 1648    BMET    Component Value Date/Time   NA 128 (L) 08/03/2024 0406   NA 132 (L) 06/11/2024 1039   K 3.7 08/03/2024 0406   CL 92 (L) 08/03/2024 0406   CO2 27 08/03/2024 0406   GLUCOSE 102 (H) 08/03/2024 0406   BUN 18 08/03/2024 0406   BUN 15 06/11/2024 1039   CREATININE 0.75 08/03/2024 0406   CREATININE 1.13 (H) 05/30/2024 0851   CALCIUM  8.5 (L) 08/03/2024 0406   GFRNONAA >60 08/03/2024 0406   GFRAA 78 08/19/2019 0830    INR    Component Value Date/Time   INR 1.1 07/24/2024 1016     Intake/Output Summary (Last 24 hours) at 08/03/2024 0908 Last data filed at 08/03/2024 0530 Gross per 24 hour  Intake 733 ml  Output 1800 ml  Net -1067 ml       Assessment/Plan:  74 y.o. female is 5 days postop, s/p: Right sided axillobifemoral bypass using 8 mm ringed PTFE and Right common femoral endarterectomy   - She says she is feeling okay this morning.  Endorses some soreness at her incision sites -Right infraclavicular and left groin incisions are intact and dry.  Right groin with Prevena VAC with good seal.  Will remove VAC on Monday -BLE warm and well-perfused with brisk DP/PT Doppler signals -Continue to work with PT/OT.  Will likely require SNF at discharge   Ahmed Holster, PA-C [facility] [phone] 08/03/2024 9:08 AM  I have independently interviewed and examined patient and agree with PA assessment and plan above.   Samuel Rittenhouse C. Sheree, MD [facility] [place] Office: [phone] Pager: [phone]    "

## 2024-08-03 NOTE — Progress Notes (Addendum)
 " PROGRESS NOTE  Pam [name] FMW:[id number] DOB: 1950-08-28 DOA: 07/24/2024 PCP: Andrew Truman GRADE., MD   LOS: 10 days   Brief Narrative / Interim history: 74 year old female with chronic systolic CHF with EF 35-40%, ICD, CAD, HTN, HLD, PAD, who comes into the hospital with leg pain.  She has known PAD and vascular surgery was consulted, and is now s/p right sided axillobifemoral bypass using 8 mm ringed PTFE & right common femoral endarterectomy on 12/29.  Hospital course complicated by acute on chronic hyponatremia and fluid overload  Subjective / 24h Interval events: She is feeling better, feels like breathing is better.  Husband is at bedside.  She denies any chest pain, no abdominal pain, no nausea or vomiting  Assesement and Plan: Principal problem Acute hypoxemic respiratory failure due to acute on chronic combined CHF - she is not on oxygen at baseline, has required it here.  She initially received IV diuresis, but due to ongoing hypotension she was placed on midodrine  and transitioned to oral furosemide .  She developed again fluid overload after transitioning to oral. - Most recent 2D echo done beginning of December 2025 showed LVEF 35-40%, grade 1 diastolic dysfunction.  RV systolic function and size were normal.  Repeat 2D echo done 1/1 showed LVEF 30-35%, with wall motion abnormalities.  RV systolic function was normal in size with slightly enlarged - Heart failure was consulted, appreciate input.  She was given 80 of furosemide  yesterday, I do not see any standing furosemide  doses for today, appreciate cardiology follow-up, defer diuresis to them  Active problems Critical limb ischemia -vascular surgery consulted and followed patient while hospitalized.  She is now status post right sided axillobifemoral bypass using 8 mm ringed PTFE & right common femoral endarterectomy on 12/29. - Back on Plavix , continue along with aspirin , continue statin  Hyponatremia -subacute on chronic,  her baseline is in the mid upper 120s.  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 - Received tolvaptan  1/2, sodium improved to 128 today  Hypothyroidism-continue Synthroid   History of COPD-no wheezing, continue inhalers, oxygen as needed  Hypokalemia-continue to monitor and replenish as indicated  Hypotension-continue midodrine  along with diuretics  Scheduled Meds:  aspirin  EC  81 mg Oral Q0600   atorvastatin   80 mg Oral Daily   Chlorhexidine  Gluconate Cloth  6 each Topical Daily   clopidogrel   75 mg Oral Q0600   digoxin   0.0625 mg Oral Daily   enoxaparin  (LOVENOX ) injection  40 mg Subcutaneous Q24H   feeding supplement  237 mL Oral TID WC   levothyroxine   37.5 mcg Oral Q0600   midodrine   5 mg Oral TID WC   polyethylene glycol  17 g Oral BID   ranolazine   1,000 mg Oral BID   senna-docusate  1 tablet Oral BID   sodium chloride  flush  10-40 mL Intracatheter Q12H   sodium chloride  flush  3 mL Intravenous Q12H   umeclidinium-vilanterol  1 puff Inhalation Daily   valACYclovir   1,000 mg Oral Daily   Continuous Infusions: PRN Meds:.acetaminophen , calcium  carbonate, HYDROmorphone  (DILAUDID ) injection, ondansetron  (ZOFRAN ) IV, oxyCODONE -acetaminophen , phenol, potassium chloride , sodium chloride  flush, sodium chloride  flush  Current Outpatient Medications  Medication Instructions   acetaminophen  (TYLENOL ) 1,000 mg, Every 6 hours PRN   albuterol  (VENTOLIN  HFA) 108 (90 Base) MCG/ACT inhaler 2 puffs, Inhalation, Every 6 hours PRN   aspirin  81 mg, Daily   atorvastatin  (LIPITOR ) 80 MG tablet TAKE 1  TABLET(80 MG) BY MOUTH DAILY   bisoprolol  (ZEBETA ) 2.5 mg, Oral, Daily   CALCIUM  PO 1 tablet, Daily   clopidogrel  (PLAVIX ) 75 MG tablet TAKE 1 TABLET(75 MG) BY MOUTH DAILY WITH BREAKFAST   digoxin  (LANOXIN ) 0.0625 mg, Oral, Daily   furosemide  (LASIX ) 20 mg, Oral, As needed   levothyroxine  (SYNTHROID ) 25 MCG tablet Take 37.5  mcg by mouth daily before breakfast.   nitroGLYCERIN  (NITROSTAT ) 0.4 mg, Sublingual, Every 5 min x3 PRN   nystatin cream (MYCOSTATIN) 1 Application, 2 times daily PRN   ranolazine  (RANEXA ) 1000 MG SR tablet TAKE 1 TABLET(1000 MG) BY MOUTH TWICE DAILY   sacubitril -valsartan  (ENTRESTO ) 49-51 MG 1 tablet, Oral, 2 times daily   spironolactone  (ALDACTONE ) 25 MG tablet TAKE 1 TABLET(25 MG) BY MOUTH DAILY   umeclidinium-vilanterol (ANORO ELLIPTA ) 62.5-25 MCG/ACT AEPB 1 puff, Inhalation, Daily   valACYclovir  (VALTREX ) 1,000 mg, Daily   Vitamin D3 2,000 Units, Daily    Diet Orders (From admission, onward)     Start     Ordered   07/30/24 1200  Diet renal/carb modified with fluid restriction Diet-HS Snack? Nothing; Fluid restriction: 1200 mL Fluid; Room service appropriate? Yes; Fluid consistency: Thin  Diet effective now       Question Answer Comment  Diet-HS Snack? Nothing   Fluid restriction: 1200 mL Fluid   Room service appropriate? Yes   Fluid consistency: Thin      07/30/24 1159            DVT prophylaxis: PLACE TED HOSE Start: 07/31/24 1415 enoxaparin  (LOVENOX ) injection 40 mg Start: 07/30/24 1000 SCD's Start: 07/29/24 1604   Lab Results  Component Value Date   PLT 395 08/03/2024      Code Status: Full Code  Family Communication: Husband at bedside  Status is: Inpatient Remains inpatient appropriate because: Hyponatremia   Level of care: Progressive  Consultants:  Vascular surgery  Objective: Vitals:   08/03/24 0406 08/03/24 0507 08/03/24 0714 08/03/24 0924  BP: (!) 131/51  115/66   Pulse: 80  74   Resp: 16  16   Temp: 98.1 F (36.7 C)  97.6 F (36.4 C)   TempSrc: Oral  Oral   SpO2: 100%  96% 98%  Weight:  55.9 kg    Height:        Intake/Output Summary (Last 24 hours) at 08/03/2024 1203 Last data filed at 08/03/2024 0530 Gross per 24 hour  Intake 733 ml  Output 1800 ml  Net -1067 ml   Wt Readings from Last 3 Encounters:  08/03/24 55.9 kg  07/23/24  49.9 kg  07/16/24 50.4 kg    Examination:  Constitutional: NAD Eyes: lids and conjunctivae normal, no scleral icterus ENMT: mmm Neck: normal, supple Respiratory:  no wheezing, no crackles. Normal respiratory effort.  Cardiovascular: Regular rate and rhythm, no murmurs / rubs / gallops.  Trace LE edema. Abdomen: soft, no distention, no tenderness. Bowel sounds positive.   Data Reviewed: I have independently reviewed following labs and imaging studies   CBC Recent Labs  Lab 07/30/24 0515 07/31/24 0427 08/01/24 0817 08/02/24 0307 08/03/24 0406  WBC 11.7* 13.7* 12.6* 10.1 9.7  HGB 6.5* 9.6* 9.1* 8.5* 8.2*  HCT 18.7* 27.0* 26.0* 24.3* 24.0*  PLT 421* 441* 439* 385 395  MCV 95.4 91.5 90.6 91.7 91.3  MCH 33.2 32.5 31.7 32.1 31.2  MCHC 34.8 35.6 35.0 35.0 34.2  RDW 14.5 16.7* 15.9* 15.8* 15.8*    Recent Labs  Lab 07/29/24 0029 07/29/24 0437 07/31/24  9572 08/01/24 0817 08/02/24 0307 08/02/24 1247 08/02/24 2036 08/03/24 0406  NA 122*   < > 120* 121* 121*  --  126* 128*  K 4.0   < > 4.4 4.1 3.9  --  3.3* 3.7  CL 85*   < > 88* 88* 88*  --  91* 92*  CO2 26   < > 24 23 26   --  27 27  GLUCOSE 119*   < > 106* 112* 99  --  137* 102*  BUN 25*   < > 30* 27* 21  --  20 18  CREATININE 1.00   < > 1.13* 0.85 0.88  --  0.75 0.75  CALCIUM  8.7*   < > 8.8* 8.4* 7.9*  --  8.3* 8.5*  AST 40  --   --  29  --   --   --  28  ALT 34  --   --  13  --   --   --  13  ALKPHOS 54  --   --  59  --   --   --  57  BILITOT 0.3  --   --  0.4  --   --   --  0.4  ALBUMIN  3.8  --   --  3.3*  --   --   --  3.1*  MG  --   --   --  1.9 1.8  --   --  2.1  PROCALCITON  --   --   --   --   --  <0.10  --   --    < > = values in this interval not displayed.    ------------------------------------------------------------------------------------------------------------------ No results for input(s): CHOL, HDL, LDLCALC, TRIG, CHOLHDL, LDLDIRECT in the last 72 hours.   Lab Results  Component  Value Date   HGBA1C 5.4 05/30/2024   ------------------------------------------------------------------------------------------------------------------ No results for input(s): TSH, T4TOTAL, T3FREE, THYROIDAB in the last 72 hours.  Invalid input(s): FREET3  Cardiac Enzymes No results for input(s): CKMB, TROPONINI, MYOGLOBIN in the last 168 hours.  Invalid input(s): CK ------------------------------------------------------------------------------------------------------------------    Component Value Date/Time   BNP 841.1 (H) 05/29/2024 1206    CBG: No results for input(s): GLUCAP in the last 168 hours.   Recent Results (from the past 240 hours)  Respiratory (~20 pathogens) panel by PCR     Status: None   Collection Time: 08/02/24 12:34 PM   Specimen: Nasopharyngeal Swab; Respiratory  Result Value Ref Range Status   Adenovirus NOT DETECTED NOT DETECTED Final   Coronavirus 229E NOT DETECTED NOT DETECTED Final    Comment: (NOTE) The Coronavirus on the Respiratory Panel, DOES NOT test for the novel  Coronavirus (2019 nCoV)    Coronavirus HKU1 NOT DETECTED NOT DETECTED Final   Coronavirus NL63 NOT DETECTED NOT DETECTED Final   Coronavirus OC43 NOT DETECTED NOT DETECTED Final   Metapneumovirus NOT DETECTED NOT DETECTED Final   Rhinovirus / Enterovirus NOT DETECTED NOT DETECTED Final   Influenza A NOT DETECTED NOT DETECTED Final   Influenza B NOT DETECTED NOT DETECTED Final   Parainfluenza Virus 1 NOT DETECTED NOT DETECTED Final   Parainfluenza Virus 2 NOT DETECTED NOT DETECTED Final   Parainfluenza Virus 3 NOT DETECTED NOT DETECTED Final   Parainfluenza Virus 4 NOT DETECTED NOT DETECTED Final   Respiratory Syncytial Virus NOT DETECTED NOT DETECTED Final   Bordetella pertussis NOT DETECTED NOT DETECTED Final   Bordetella Parapertussis NOT DETECTED NOT DETECTED Final   Chlamydophila  pneumoniae NOT DETECTED NOT DETECTED Final   Mycoplasma pneumoniae NOT  DETECTED NOT DETECTED Final    Comment: Performed at [facility], [address] [place], [place] [address]  Resp panel by RT-PCR (RSV, Flu A&B, Covid) Anterior Nasal Swab     Status: None   Collection Time: 08/02/24 12:34 PM   Specimen: Anterior Nasal Swab  Result Value Ref Range Status   SARS Coronavirus 2 by RT PCR NEGATIVE NEGATIVE Final   Influenza A by PCR NEGATIVE NEGATIVE Final   Influenza B by PCR NEGATIVE NEGATIVE Final    Comment: (NOTE) The Xpert Xpress SARS-CoV-2/FLU/RSV plus assay is intended as an aid in the diagnosis of influenza from Nasopharyngeal swab specimens and should not be used as a sole basis for treatment. Nasal washings and aspirates are unacceptable for Xpert Xpress SARS-CoV-2/FLU/RSV testing.  Fact Sheet for Patients: bloggercourse.com  Fact Sheet for Healthcare Providers: seriousbroker.it  This test is not yet approved or cleared by the [place]  FDA and has been authorized for detection and/or diagnosis of SARS-CoV-2 by FDA under an Emergency Use Authorization (EUA). This EUA will remain in effect (meaning this test can be used) for the duration of the COVID-19 declaration under Section 564(b)(1) of the Act, 21 U.S.C. section 360bbb-3(b)(1), unless the authorization is terminated or revoked.     Resp Syncytial Virus by PCR NEGATIVE NEGATIVE Final    Comment: (NOTE) Fact Sheet for Patients: bloggercourse.com  Fact Sheet for Healthcare Providers: seriousbroker.it  This test is not yet approved or cleared by the [place]  FDA and has been authorized for detection and/or diagnosis of SARS-CoV-2 by FDA under an Emergency Use Authorization (EUA). This EUA will remain in effect (meaning this test can be used) for the duration of the COVID-19 declaration under Section 564(b)(1) of the Act, 21 U.S.C. section 360bbb-3(b)(1), unless the  authorization is terminated or revoked.  Performed at [facility], [address] [place], [place] [address]      Radiology Studies: DG Chest 2 View Result Date: 08/02/2024 CLINICAL DATA:  Pleural effusion.  Shortness of breath. EXAM: CHEST - 2 VIEW COMPARISON:  07/24/2024 FINDINGS: Left-sided pacemaker in place. The heart is normal in size. Emphysema with diffuse interstitial coarsening. Small pleural effusions with patchy bibasilar airspace disease, left greater than right, slight worsening. No pneumothorax. IMPRESSION: 1. Small pleural effusions with patchy bibasilar airspace disease, left greater than right, slight worsening from prior. 2. Emphysema with chronic interstitial coarsening. Electronically Signed   By: Andrea Gasman M.D.   On: 08/02/2024 15:38   US  EKG SITE RITE Result Date: 08/02/2024 If Site Rite image not attached, placement could not be confirmed due to current cardiac rhythm.    Nilda Fendt, MD, PhD [facility]  Between 7 am - 7 pm I am available, please contact me via Amion (for emergencies) or Securechat (non urgent messages)  Between 7 pm - 7 am I am not available, please contact night coverage MD/APP via Amion  "

## 2024-08-03 NOTE — Progress Notes (Signed)
 Mobility Specialist Progress Note;   08/03/24 1126  Mobility  Activity Pivoted/transferred to/from [facility]  Level of Assistance Contact guard assist, steadying assist  Assistive Device Front wheel walker  Distance Ambulated (ft) 3 ft  Activity Response Tolerated fair  Mobility Referral Yes  Mobility visit 1 Mobility  Mobility Specialist Start Time (ACUTE ONLY) 1126  Mobility Specialist Stop Time (ACUTE ONLY) 1142  Mobility Specialist Time Calculation (min) (ACUTE ONLY) 16 min   Returned to room after period of time, pt requesting assistance from [facility]. Required assistance w/ pericare. Pt deferred further ambulation d/t fatigue. Pt returned back to bed and left with all needs met. Husband present.   Lauraine Erm Mobility Specialist Please contact via SecureChat or [employer] [phone]

## 2024-08-03 NOTE — Progress Notes (Signed)
 "  Progress Note  Patient Name: Pam [name] Date of Encounter: 08/03/2024  Primary Cardiologist: Lonni Cash, MD   Subjective   Reports improvement but not at baseline. With a cough.   Inpatient Medications    Scheduled Meds:  aspirin  EC  81 mg Oral Q0600   atorvastatin   80 mg Oral Daily   Chlorhexidine  Gluconate Cloth  6 each Topical Daily   clopidogrel   75 mg Oral Q0600   digoxin   0.0625 mg Oral Daily   enoxaparin  (LOVENOX ) injection  40 mg Subcutaneous Q24H   feeding supplement  237 mL Oral TID WC   levothyroxine   37.5 mcg Oral Q0600   midodrine   5 mg Oral TID WC   polyethylene glycol  17 g Oral BID   ranolazine   1,000 mg Oral BID   senna-docusate  1 tablet Oral BID   sodium chloride  flush  10-40 mL Intracatheter Q12H   sodium chloride  flush  3 mL Intravenous Q12H   umeclidinium-vilanterol  1 puff Inhalation Daily   valACYclovir   1,000 mg Oral Daily   Continuous Infusions:  PRN Meds: acetaminophen , calcium  carbonate, HYDROmorphone  (DILAUDID ) injection, ondansetron  (ZOFRAN ) IV, oxyCODONE -acetaminophen , phenol, potassium chloride , sodium chloride  flush, sodium chloride  flush   Vital Signs    Vitals:   08/03/24 0406 08/03/24 0507 08/03/24 0714 08/03/24 0924  BP: (!) 131/51  115/66   Pulse: 80  74   Resp: 16  16   Temp: 98.1 F (36.7 C)  97.6 F (36.4 C)   TempSrc: Oral  Oral   SpO2: 100%  96% 98%  Weight:  55.9 kg    Height:        Intake/Output Summary (Last 24 hours) at 08/03/2024 1202 Last data filed at 08/03/2024 0530 Gross per 24 hour  Intake 733 ml  Output 1800 ml  Net -1067 ml   Filed Weights   08/01/24 0640 08/02/24 0358 08/03/24 0507  Weight: 57.2 kg 57.4 kg 55.9 kg    Telemetry    Sinus rhythm - Personally Reviewed  ECG    NSR with anterior infarct and nonspecific ST changes - Personally Reviewed  Physical Exam   Physical Exam Vitals and nursing note reviewed.  Constitutional:      Appearance: Normal appearance.  HENT:      Head: Normocephalic and atraumatic.  Eyes:     Conjunctiva/sclera: Conjunctivae normal.  Neck:     Vascular: No hepatojugular reflux or JVD.  Cardiovascular:     Rate and Rhythm: Normal rate and regular rhythm.  Pulmonary:     Effort: No respiratory distress.     Breath sounds: Wheezing present.     Comments: cough Musculoskeletal:        General: No swelling or tenderness.  Skin:    Coloration: Skin is not jaundiced or pale.  Neurological:     Mental Status: She is alert.      Labs    Chemistry Recent Labs  Lab 07/29/24 0029 07/29/24 0437 08/01/24 0817 08/02/24 0307 08/02/24 2036 08/03/24 0406  NA 122*   < > 121* 121* 126* 128*  K 4.0   < > 4.1 3.9 3.3* 3.7  CL 85*   < > 88* 88* 91* 92*  CO2 26   < > 23 26 27 27   GLUCOSE 119*   < > 112* 99 137* 102*  BUN 25*   < > 27* 21 20 18   CREATININE 1.00   < > 0.85 0.88 0.75 0.75  CALCIUM  8.7*   < >  8.4* 7.9* 8.3* 8.5*  PROT 5.9*  --  5.4*  --   --  5.1*  ALBUMIN  3.8  --  3.3*  --   --  3.1*  AST 40  --  29  --   --  28  ALT 34  --  13  --   --  13  ALKPHOS 54  --  59  --   --  57  BILITOT 0.3  --  0.4  --   --  0.4  GFRNONAA 59*   < > >60 >60 >60 >60  ANIONGAP 11   < > 10 7 9 8    < > = values in this interval not displayed.     Hematology Recent Labs  Lab 08/01/24 0817 08/02/24 0307 08/03/24 0406  WBC 12.6* 10.1 9.7  RBC 2.87* 2.65* 2.63*  HGB 9.1* 8.5* 8.2*  HCT 26.0* 24.3* 24.0*  MCV 90.6 91.7 91.3  MCH 31.7 32.1 31.2  MCHC 35.0 35.0 34.2  RDW 15.9* 15.8* 15.8*  PLT 439* 385 395    Cardiac EnzymesNo results for input(s): TROPONINI in the last 168 hours. No results for input(s): TROPIPOC in the last 168 hours.   BNP Recent Labs  Lab 08/02/24 1551  PROBNP 5,039.0*     DDimer No results for input(s): DDIMER in the last 168 hours.   Radiology    DG Chest 2 View Result Date: 08/02/2024 CLINICAL DATA:  Pleural effusion.  Shortness of breath. EXAM: CHEST - 2 VIEW COMPARISON:  07/24/2024  FINDINGS: Left-sided pacemaker in place. The heart is normal in size. Emphysema with diffuse interstitial coarsening. Small pleural effusions with patchy bibasilar airspace disease, left greater than right, slight worsening. No pneumothorax. IMPRESSION: 1. Small pleural effusions with patchy bibasilar airspace disease, left greater than right, slight worsening from prior. 2. Emphysema with chronic interstitial coarsening. Electronically Signed   By: Andrea Gasman M.D.   On: 08/02/2024 15:38   US  EKG SITE RITE Result Date: 08/02/2024 If Site Rite image not attached, placement could not be confirmed due to current cardiac rhythm.   Cardiac Studies   Echo 08/01/24:    1. Left ventricular ejection fraction, by estimation, is 30 to 35%. The  left ventricle has moderately decreased function. The left ventricle  demonstrates regional wall motion abnormalities (see scoring  diagram/findings for description). Left ventricular   diastolic parameters are indeterminate. Elevated left atrial pressure.   2. Right ventricular systolic function is normal. The right ventricular  size is moderately enlarged. There is mildly elevated pulmonary artery  systolic pressure.   3. Left atrial size was moderately dilated.   4. Right atrial size was moderately dilated.   5. Large pleural effusion.   6. The mitral valve is degenerative. Trivial mitral valve regurgitation.  No evidence of mitral stenosis. Moderate mitral annular calcification.   7. The aortic valve is abnormal. Aortic valve regurgitation is not  visualized. Aortic valve sclerosis/calcification is present, without any  evidence of aortic stenosis.   8. The inferior vena cava is normal in size with greater than 50%  respiratory variability, suggesting right atrial pressure of 3 mmHg.   Comparison(s): A prior study was performed on 07/02/24. Prior images  reviewed side by side. Left ventricular ejection fraction and wall motion  abnormalities are  slightly worse on today's study.   Patient Profile     74 y.o. female with a history of chronic HFrEF secondary to ischemic cardiomyopathy, CAD with CTO of mid LAD, s/p  ICD, former smoker, peripheral arterial disease who was initially planned for outpatient bilateral common femoral enterectomy and right axillofemoral bypass with VVS d/t ischemic rest pain. On preop labs was noted to have a drop in Hb and hyponatremia and referred to the ED and admitted for acute on chronic HF. Underwent R axillofemoral bypass with PTFE and R CFA endarterectomy on 12/29 but has had volume overload issues post operatively.   Assessment & Plan   Acute hypoxemic respiratory failure, suspect a combination of a COPD exacerbation and acute on chronic HF - still requiring O2. Viral panel negative. Will defer to primary team Acute on chronic systolic HF - PICC placed and CVP and pro BNP checked. CVP 11. Does not look overtly volume overloaded on exam but weight is still up 3 kg, similar to yesterday's exam. On digoxin .  Inaccurate I/O. Will give an additional dose of Lasix  today and likely start PO tomorrow Ischemic cardiomyopathy  CAD and NSTEMI with CTO of LAD with collaterals and severe pLCx/OM1 stenosis on cath  Hyponatremia - given tolvaptan  and lasix . Improving PAD s/p R axillofemoral bypass with PTFE and R CFA endarterectomy on 12/29 - Post op anemia    Time spent coordinating care: 55 minutes     For questions or updates, please contact [facility] Please consult [email or web address] for contact info under        Signed, Emeline Calender, DO 08/03/2024, 12:02 PM    "

## 2024-08-03 NOTE — Progress Notes (Signed)
 Mobility Specialist Progress Note;   08/03/24 1057  Mobility  Activity Ambulated with assistance;Pivoted/transferred to/from [facility] (in room)  Level of Assistance Contact guard assist, steadying assist  Assistive Device Front wheel walker  Distance Ambulated (ft) 20 ft  Activity Response Tolerated fair  Mobility Referral Yes  Mobility visit 1 Mobility  Mobility Specialist Start Time (ACUTE ONLY) 1057  Mobility Specialist Stop Time (ACUTE ONLY) 1113  Mobility Specialist Time Calculation (min) (ACUTE ONLY) 16 min   Pt agreeable to mobility. On 3LO2 upon arrival. Required MinG assistance for all mobility. Able to ambulate in room on 3LO2. Pt with episode of loose stools requiring transfer to [facility]. Pt requested to be left till finished. Pt instructed to call once finished, husband present.   Lauraine Erm Mobility Specialist Please contact via SecureChat or [employer] [phone]

## 2024-08-04 ENCOUNTER — Inpatient Hospital Stay (HOSPITAL_COMMUNITY)

## 2024-08-04 DIAGNOSIS — I5020 Unspecified systolic (congestive) heart failure: Secondary | ICD-10-CM | POA: Diagnosis not present

## 2024-08-04 LAB — COMPREHENSIVE METABOLIC PANEL WITH GFR
ALT: 17 U/L (ref 0–44)
AST: 34 U/L (ref 15–41)
Albumin: 3.4 g/dL — ABNORMAL LOW (ref 3.5–5.0)
Alkaline Phosphatase: 64 U/L (ref 38–126)
Anion gap: 8 (ref 5–15)
BUN: 17 mg/dL (ref 8–23)
CO2: 27 mmol/L (ref 22–32)
Calcium: 8.6 mg/dL — ABNORMAL LOW (ref 8.9–10.3)
Chloride: 88 mmol/L — ABNORMAL LOW (ref 98–111)
Creatinine, Ser: 0.7 mg/dL (ref 0.44–1.00)
GFR, Estimated: >60 mL/min
Glucose, Bld: 144 mg/dL — ABNORMAL HIGH (ref 70–99)
Potassium: 3.7 mmol/L (ref 3.5–5.1)
Sodium: 124 mmol/L — ABNORMAL LOW (ref 135–145)
Total Bilirubin: 0.4 mg/dL (ref 0.0–1.2)
Total Protein: 5.5 g/dL — ABNORMAL LOW (ref 6.5–8.1)

## 2024-08-04 LAB — COOXEMETRY PANEL
Carboxyhemoglobin: 4.3 % — ABNORMAL HIGH (ref 0.5–1.5)
Methemoglobin: 3.7 % — ABNORMAL HIGH (ref 0.0–1.5)
O2 Saturation: 62.7 %
Total hemoglobin: 9.1 g/dL — ABNORMAL LOW (ref 12.0–16.0)

## 2024-08-04 LAB — CBC
HCT: 24.9 % — ABNORMAL LOW (ref 36.0–46.0)
Hemoglobin: 8.7 g/dL — ABNORMAL LOW (ref 12.0–15.0)
MCH: 31.8 pg (ref 26.0–34.0)
MCHC: 34.9 g/dL (ref 30.0–36.0)
MCV: 90.9 fL (ref 80.0–100.0)
Platelets: 443 K/uL — ABNORMAL HIGH (ref 150–400)
RBC: 2.74 MIL/uL — ABNORMAL LOW (ref 3.87–5.11)
RDW: 15.7 % — ABNORMAL HIGH (ref 11.5–15.5)
WBC: 11.4 K/uL — ABNORMAL HIGH (ref 4.0–10.5)
nRBC: 0 % (ref 0.0–0.2)

## 2024-08-04 LAB — MAGNESIUM: Magnesium: 1.9 mg/dL (ref 1.7–2.4)

## 2024-08-04 LAB — MRSA NEXT GEN BY PCR, NASAL: MRSA by PCR Next Gen: NOT DETECTED

## 2024-08-04 MED ORDER — FUROSEMIDE 10 MG/ML IJ SOLN
80.0000 mg | Freq: Two times a day (BID) | INTRAMUSCULAR | Status: DC
  Administered 2024-08-04 – 2024-08-05 (×3): 80 mg via INTRAVENOUS
  Filled 2024-08-04 (×4): qty 8

## 2024-08-04 MED ORDER — PIPERACILLIN-TAZOBACTAM 3.375 G IVPB
3.3750 g | Freq: Three times a day (TID) | INTRAVENOUS | Status: AC
  Administered 2024-08-04 – 2024-08-11 (×21): 3.375 g via INTRAVENOUS
  Filled 2024-08-04 (×23): qty 50

## 2024-08-04 NOTE — Progress Notes (Addendum)
" °  Progress Note    08/04/2024 8:42 AM 6 Days Post-Op  Subjective: Reports feeling short of breath this morning, about to receive a breathing treatment    Vitals:   08/04/24 0530 08/04/24 0818  BP:  135/63  Pulse: 78 84  Resp: 17 20  Temp:  97.6 F (36.4 C)  SpO2: 96% 100%    Physical Exam: General: No acute distress Cardiac: Regular Lungs:  slightly increased work of breathing Incisions:  right groin with Prevena with good seal, left groin intact and dry Extremities: Brisk DP/PT Doppler signals bilaterally. Dry ulcerations to the right heel and great toe  CBC    Component Value Date/Time   WBC 9.7 08/03/2024 0406   RBC 2.63 (L) 08/03/2024 0406   HGB 8.2 (L) 08/03/2024 0406   HGB 13.6 06/11/2024 1039   HCT 24.0 (L) 08/03/2024 0406   HCT 39.0 06/11/2024 1039   PLT 395 08/03/2024 0406   PLT 335 06/11/2024 1039   MCV 91.3 08/03/2024 0406   MCV 102 (H) 06/11/2024 1039   MCH 31.2 08/03/2024 0406   MCHC 34.2 08/03/2024 0406   RDW 15.8 (H) 08/03/2024 0406   RDW 13.1 06/11/2024 1039   LYMPHSABS 1.4 07/25/2024 0253   LYMPHSABS 2.9 08/25/2016 1648   MONOABS 1.2 (H) 07/25/2024 0253   EOSABS 0.0 07/25/2024 0253   EOSABS 0.2 08/25/2016 1648   BASOSABS 0.0 07/25/2024 0253   BASOSABS 0.0 08/25/2016 1648    BMET    Component Value Date/Time   NA 128 (L) 08/03/2024 0406   NA 132 (L) 06/11/2024 1039   K 3.7 08/03/2024 0406   CL 92 (L) 08/03/2024 0406   CO2 27 08/03/2024 0406   GLUCOSE 102 (H) 08/03/2024 0406   BUN 18 08/03/2024 0406   BUN 15 06/11/2024 1039   CREATININE 0.75 08/03/2024 0406   CREATININE 1.13 (H) 05/30/2024 0851   CALCIUM  8.5 (L) 08/03/2024 0406   GFRNONAA >60 08/03/2024 0406   GFRAA 78 08/19/2019 0830    INR    Component Value Date/Time   INR 1.1 07/24/2024 1016     Intake/Output Summary (Last 24 hours) at 08/04/2024 0842 Last data filed at 08/03/2024 1623 Gross per 24 hour  Intake --  Output 950 ml  Net -950 ml      Assessment/Plan:   74 y.o. female is 6 days postop, s/p: Right sided axillobifemoral bypass using 8 mm ringed PTFE and Right common femoral endarterectomy    - She says she is not feeling great this morning.  Endorses some shortness of breath.  She is about to get a breathing treatment -Right infraclavicular and left groin incisions are healing well.  Right groin with Prevena VAC with good seal, will remove tomorrow -Small, dry ulcerations to the right heel and great toe -Bilateral lower extremities well-perfused with brisk DP/PT Doppler signals -Worked a little bit on her mobility yesterday, largely limited by generalized fatigue and shortness of breath   Ahmed Holster, PA-C [facility] [phone] 08/04/2024 8:42 AM  I have independently interviewed and examine patient and agree with PA assessment and plan above.   Devyn Griffing C. Sheree, MD [facility] [place] Office: [phone] Pager: [phone]     "

## 2024-08-04 NOTE — Progress Notes (Signed)
 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  She has known PAD and vascular surgery was consulted, and is now s/p right sided axillobifemoral bypass using 8 mm ringed PTFE & right common femoral endarterectomy on 12/29.  Hospital course complicated by acute on chronic hyponatremia and fluid overload  Subjective / 24h Interval events: Her breathing has gotten worse since yesterday.  She has been having increased cough and tells me her sputum has been more purulent, yellowish with occasional red tinge   Assesement and Plan: Principal problem Acute hypoxemic respiratory failure due to acute on chronic combined CHF - she is not on oxygen at baseline, has required it here.  She initially received IV diuresis, but due to ongoing hypotension she was placed on midodrine  and transitioned to oral furosemide .  She developed again fluid overload after transitioning to oral. - Most recent 2D echo done beginning of December 2025 showed LVEF 35-40%, grade 1 diastolic dysfunction.  RV systolic function and size were normal.  Repeat 2D echo done 1/1 showed LVEF 30-35%, with wall motion abnormalities.  RV systolic function was normal in size with slightly enlarged - Cardiology following, receiving furosemide   Active problems Critical limb ischemia -vascular surgery consulted and followed patient while hospitalized.  She is now status post right sided axillobifemoral bypass using 8 mm ringed PTFE & right common femoral endarterectomy on 12/29. - Back on Plavix , continue along with aspirin , continue statin  Possible multifocal pneumonia -with increasing shortness of breath, slightly increasing leukocytosis, purulent sputum production it is possible that she has developed  pneumonia.  Start Zosyn .  Check MRSA PCR, send sputum for cultures  Hyponatremia -subacute on chronic, her baseline is in the mid upper 120s.  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 - Received tolvaptan  1/2, sodium improved to 128 yesterday but decreasing to 124 again today  Hypothyroidism-continue Synthroid   History of COPD-no wheezing, continue inhalers, added nebulizers due to increased WOB, oxygen as needed  Hypokalemia-continue to monitor and replenish as indicated  Hypotension-continue midodrine  along with diuretics  Scheduled Meds:  aspirin  EC  81 mg Oral Q0600   atorvastatin   80 mg Oral Daily   Chlorhexidine  Gluconate Cloth  6 each Topical Daily   clopidogrel   75 mg Oral Q0600   digoxin   0.0625 mg Oral Daily   enoxaparin  (LOVENOX ) injection  40 mg Subcutaneous Q24H   feeding supplement  237 mL Oral TID WC   furosemide   80 mg Intravenous BID   levothyroxine   37.5 mcg Oral Q0600   midodrine   5 mg Oral TID WC   polyethylene glycol  17 g Oral BID   ranolazine   1,000 mg Oral BID   senna-docusate  1 tablet Oral BID   sodium chloride  flush  10-40 mL Intracatheter Q12H   sodium chloride  flush  3 mL Intravenous Q12H   umeclidinium-vilanterol  1 puff Inhalation Daily   valACYclovir   1,000 mg Oral Daily   Continuous Infusions:  piperacillin -tazobactam (ZOSYN )  IV     PRN Meds:.acetaminophen , calcium  carbonate, HYDROmorphone  (DILAUDID ) injection, ipratropium-albuterol , ondansetron  (ZOFRAN ) IV, oxyCODONE -acetaminophen , phenol, potassium chloride , sodium chloride  flush, sodium chloride  flush  Current Outpatient Medications  Medication Instructions   acetaminophen  (  TYLENOL ) 1,000 mg, Every 6 hours PRN   albuterol  (VENTOLIN  HFA) 108 (90 Base) MCG/ACT inhaler 2 puffs, Inhalation, Every 6 hours PRN   aspirin  81 mg, Daily   atorvastatin  (LIPITOR ) 80 MG tablet TAKE 1 TABLET(80 MG) BY MOUTH DAILY   bisoprolol   (ZEBETA ) 2.5 mg, Oral, Daily   CALCIUM  PO 1 tablet, Daily   clopidogrel  (PLAVIX ) 75 MG tablet TAKE 1 TABLET(75 MG) BY MOUTH DAILY WITH BREAKFAST   digoxin  (LANOXIN ) 0.0625 mg, Oral, Daily   furosemide  (LASIX ) 20 mg, Oral, As needed   levothyroxine  (SYNTHROID ) 25 MCG tablet Take 37.5 mcg by mouth daily before breakfast.   nitroGLYCERIN  (NITROSTAT ) 0.4 mg, Sublingual, Every 5 min x3 PRN   nystatin cream (MYCOSTATIN) 1 Application, 2 times daily PRN   ranolazine  (RANEXA ) 1000 MG SR tablet TAKE 1 TABLET(1000 MG) BY MOUTH TWICE DAILY   sacubitril -valsartan  (ENTRESTO ) 49-51 MG 1 tablet, Oral, 2 times daily   spironolactone  (ALDACTONE ) 25 MG tablet TAKE 1 TABLET(25 MG) BY MOUTH DAILY   umeclidinium-vilanterol (ANORO ELLIPTA ) 62.5-25 MCG/ACT AEPB 1 puff, Inhalation, Daily   valACYclovir  (VALTREX ) 1,000 mg, Daily   Vitamin D3 2,000 Units, Daily    Diet Orders (From admission, onward)     Start     Ordered   07/30/24 1200  Diet renal/carb modified with fluid restriction Diet-HS Snack? Nothing; Fluid restriction: 1200 mL Fluid; Room service appropriate? Yes; Fluid consistency: Thin  Diet effective now       Question Answer Comment  Diet-HS Snack? Nothing   Fluid restriction: 1200 mL Fluid   Room service appropriate? Yes   Fluid consistency: Thin      07/30/24 1159            DVT prophylaxis: PLACE TED HOSE Start: 07/31/24 1415 enoxaparin  (LOVENOX ) injection 40 mg Start: 07/30/24 1000 SCD's Start: 07/29/24 1604   Lab Results  Component Value Date   PLT 443 (H) 08/04/2024      Code Status: Full Code  Family Communication: Husband at bedside  Status is: Inpatient Remains inpatient appropriate because: Hyponatremia   Level of care: Progressive  Consultants:  Vascular surgery  Objective: Vitals:   08/04/24 0440 08/04/24 0530 08/04/24 0818 08/04/24 1105  BP: (!) 111/46  135/63 (!) 145/75  Pulse: 82 78 84 82  Resp: 17 17 20  (!) 23  Temp: 98 F (36.7 C)  97.6 F (36.4 C)  98 F (36.7 C)  TempSrc: Oral  Oral Oral  SpO2: 91% 96% 100% 90%  Weight:      Height:        Intake/Output Summary (Last 24 hours) at 08/04/2024 1304 Last data filed at 08/04/2024 1108 Gross per 24 hour  Intake --  Output 1150 ml  Net -1150 ml   Wt Readings from Last 3 Encounters:  08/03/24 55.9 kg  07/23/24 49.9 kg  07/16/24 50.4 kg    Examination:  Constitutional: NAD Eyes: lids and conjunctivae normal, no scleral icterus ENMT: mmm Neck: normal, supple Respiratory: Bilateral rhonchi, no wheezing, Cardiovascular: Regular rate and rhythm, no murmurs / rubs / gallops.  Trace LE edema. Abdomen: soft, no distention, no tenderness. Bowel sounds positive.   Data Reviewed: I have independently reviewed following labs and imaging studies   CBC Recent Labs  Lab 07/31/24 0427 08/01/24 0817 08/02/24 0307 08/03/24 0406 08/04/24 0500  WBC 13.7* 12.6* 10.1 9.7 11.4*  HGB 9.6* 9.1* 8.5* 8.2* 8.7*  HCT 27.0* 26.0* 24.3* 24.0* 24.9*  PLT 441* 439* 385 395 443*  MCV  91.5 90.6 91.7 91.3 90.9  MCH 32.5 31.7 32.1 31.2 31.8  MCHC 35.6 35.0 35.0 34.2 34.9  RDW 16.7* 15.9* 15.8* 15.8* 15.7*    Recent Labs  Lab 07/29/24 0029 07/29/24 0437 08/01/24 0817 08/02/24 0307 08/02/24 1247 08/02/24 2036 08/03/24 0406 08/04/24 0500  NA 122*   < > 121* 121*  --  126* 128* 124*  K 4.0   < > 4.1 3.9  --  3.3* 3.7 3.7  CL 85*   < > 88* 88*  --  91* 92* 88*  CO2 26   < > 23 26  --  27 27 27   GLUCOSE 119*   < > 112* 99  --  137* 102* 144*  BUN 25*   < > 27* 21  --  20 18 17   CREATININE 1.00   < > 0.85 0.88  --  0.75 0.75 0.70  CALCIUM  8.7*   < > 8.4* 7.9*  --  8.3* 8.5* 8.6*  AST 40  --  29  --   --   --  28 34  ALT 34  --  13  --   --   --  13 17  ALKPHOS 54  --  59  --   --   --  57 64  BILITOT 0.3  --  0.4  --   --   --  0.4 0.4  ALBUMIN  3.8  --  3.3*  --   --   --  3.1* 3.4*  MG  --   --  1.9 1.8  --   --  2.1 1.9  PROCALCITON  --   --   --   --  <0.10  --   --   --    < > =  values in this interval not displayed.    ------------------------------------------------------------------------------------------------------------------ No results for input(s): CHOL, HDL, LDLCALC, TRIG, CHOLHDL, LDLDIRECT in the last 72 hours.   Lab Results  Component Value Date   HGBA1C 5.4 05/30/2024   ------------------------------------------------------------------------------------------------------------------ No results for input(s): TSH, T4TOTAL, T3FREE, THYROIDAB in the last 72 hours.  Invalid input(s): FREET3  Cardiac Enzymes No results for input(s): CKMB, TROPONINI, MYOGLOBIN in the last 168 hours.  Invalid input(s): CK ------------------------------------------------------------------------------------------------------------------    Component Value Date/Time   BNP 841.1 (H) 05/29/2024 1206    CBG: No results for input(s): GLUCAP in the last 168 hours.   Recent Results (from the past 240 hours)  Respiratory (~20 pathogens) panel by PCR     Status: None   Collection Time: 08/02/24 12:34 PM   Specimen: Nasopharyngeal Swab; Respiratory  Result Value Ref Range Status   Adenovirus NOT DETECTED NOT DETECTED Final   Coronavirus 229E NOT DETECTED NOT DETECTED Final    Comment: (NOTE) The Coronavirus on the Respiratory Panel, DOES NOT test for the novel  Coronavirus (2019 nCoV)    Coronavirus HKU1 NOT DETECTED NOT DETECTED Final   Coronavirus NL63 NOT DETECTED NOT DETECTED Final   Coronavirus OC43 NOT DETECTED NOT DETECTED Final   Metapneumovirus NOT DETECTED NOT DETECTED Final   Rhinovirus / Enterovirus NOT DETECTED NOT DETECTED Final   Influenza A NOT DETECTED NOT DETECTED Final   Influenza B NOT DETECTED NOT DETECTED Final   Parainfluenza Virus 1 NOT DETECTED NOT DETECTED Final   Parainfluenza Virus 2 NOT DETECTED NOT DETECTED Final   Parainfluenza Virus 3 NOT DETECTED NOT DETECTED Final   Parainfluenza Virus 4 NOT  DETECTED NOT DETECTED Final   Respiratory Syncytial  Virus NOT DETECTED NOT DETECTED Final   Bordetella pertussis NOT DETECTED NOT DETECTED Final   Bordetella Parapertussis NOT DETECTED NOT DETECTED Final   Chlamydophila pneumoniae NOT DETECTED NOT DETECTED Final   Mycoplasma pneumoniae NOT DETECTED NOT DETECTED Final    Comment: Performed at [facility], [address], [place], [place] [address]  Resp panel by RT-PCR (RSV, Flu A&B, Covid) Anterior Nasal Swab     Status: None   Collection Time: 08/02/24 12:34 PM   Specimen: Anterior Nasal Swab  Result Value Ref Range Status   SARS Coronavirus 2 by RT PCR NEGATIVE NEGATIVE Final   Influenza A by PCR NEGATIVE NEGATIVE Final   Influenza B by PCR NEGATIVE NEGATIVE Final    Comment: (NOTE) The Xpert Xpress SARS-CoV-2/FLU/RSV plus assay is intended as an aid in the diagnosis of influenza from Nasopharyngeal swab specimens and should not be used as a sole basis for treatment. Nasal washings and aspirates are unacceptable for Xpert Xpress SARS-CoV-2/FLU/RSV testing.  Fact Sheet for Patients: bloggercourse.com  Fact Sheet for Healthcare Providers: seriousbroker.it  This test is not yet approved or cleared by the [place]  FDA and has been authorized for detection and/or diagnosis of SARS-CoV-2 by FDA under an Emergency Use Authorization (EUA). This EUA will remain in effect (meaning this test can be used) for the duration of the COVID-19 declaration under Section 564(b)(1) of the Act, 21 U.S.C. section 360bbb-3(b)(1), unless the authorization is terminated or revoked.     Resp Syncytial Virus by PCR NEGATIVE NEGATIVE Final    Comment: (NOTE) Fact Sheet for Patients: bloggercourse.com  Fact Sheet for Healthcare Providers: seriousbroker.it  This test is not yet approved or cleared by the [place]  FDA and has been  authorized for detection and/or diagnosis of SARS-CoV-2 by FDA under an Emergency Use Authorization (EUA). This EUA will remain in effect (meaning this test can be used) for the duration of the COVID-19 declaration under Section 564(b)(1) of the Act, 21 U.S.C. section 360bbb-3(b)(1), unless the authorization is terminated or revoked.  Performed at [facility], [address], [place], [place] [address]      Radiology Studies: DG CHEST PORT 1 VIEW Result Date: 08/04/2024 EXAM: 1 VIEW(S) XRAY OF THE CHEST 08/04/2024 09:49:00 AM COMPARISON: 08/02/2024 CLINICAL HISTORY: Dyspnea FINDINGS: LINES, TUBES AND DEVICES: Right PICC line with the tip overlying the expected region of the superior vena cava. Left single lead cardiac defibrillator in place. LUNGS AND PLEURA: Emphysema with chronic coarsened interstitial markings. Increased bibasilar patchy airspace opacities. Grossly stable bilateral pleural effusions. No pneumothorax. HEART AND MEDIASTINUM: Aortic atherosclerosis. BONES AND SOFT TISSUES: No acute osseous abnormality. IMPRESSION: 1. Increased bibasilar patchy airspace opacities. 2. Grossly stable bilateral pleural effusions. Electronically signed by: Morgane Naveau MD 08/04/2024 10:13 AM EST RP Workstation: [id number]     Nilda Fendt, MD, PhD [facility]  Between 7 am - 7 pm I am available, please contact me via Amion (for emergencies) or Securechat (non urgent messages)  Between 7 pm - 7 am I am not available, please contact night coverage MD/APP via Amion  "

## 2024-08-04 NOTE — Progress Notes (Signed)
 "  Progress Note  Patient Name: Pam [name] Date of Encounter: 08/04/2024  Primary Cardiologist: Lonni Cash, MD   Subjective   Respiratory status worsening today. CXR shows pulmonary opacities. States that she had good output with the lasix  yesterday.   Inpatient Medications    Scheduled Meds:  aspirin  EC  81 mg Oral Q0600   atorvastatin   80 mg Oral Daily   Chlorhexidine  Gluconate Cloth  6 each Topical Daily   clopidogrel   75 mg Oral Q0600   digoxin   0.0625 mg Oral Daily   enoxaparin  (LOVENOX ) injection  40 mg Subcutaneous Q24H   feeding supplement  237 mL Oral TID WC   levothyroxine   37.5 mcg Oral Q0600   midodrine   5 mg Oral TID WC   polyethylene glycol  17 g Oral BID   ranolazine   1,000 mg Oral BID   senna-docusate  1 tablet Oral BID   sodium chloride  flush  10-40 mL Intracatheter Q12H   sodium chloride  flush  3 mL Intravenous Q12H   umeclidinium-vilanterol  1 puff Inhalation Daily   valACYclovir   1,000 mg Oral Daily   Continuous Infusions:  PRN Meds: acetaminophen , calcium  carbonate, HYDROmorphone  (DILAUDID ) injection, ipratropium-albuterol , ondansetron  (ZOFRAN ) IV, oxyCODONE -acetaminophen , phenol, potassium chloride , sodium chloride  flush, sodium chloride  flush   Vital Signs    Vitals:   08/04/24 0440 08/04/24 0530 08/04/24 0818 08/04/24 1105  BP: (!) 111/46  135/63 (!) 145/75  Pulse: 82 78 84 82  Resp: 17 17 20  (!) 23  Temp: 98 F (36.7 C)  97.6 F (36.4 C) 98 F (36.7 C)  TempSrc: Oral  Oral Oral  SpO2: 91% 96% 100% 90%  Weight:      Height:        Intake/Output Summary (Last 24 hours) at 08/04/2024 1116 Last data filed at 08/04/2024 1108 Gross per 24 hour  Intake --  Output 1150 ml  Net -1150 ml   Filed Weights   08/01/24 0640 08/02/24 0358 08/03/24 0507  Weight: 57.2 kg 57.4 kg 55.9 kg    Telemetry    Sinus rhythm - Personally Reviewed  ECG    NSR with anterior infarct and nonspecific ST changes - Personally Reviewed  Physical  Exam   Physical Exam Vitals and nursing note reviewed.  Constitutional:      Appearance: Normal appearance.  HENT:     Head: Normocephalic and atraumatic.  Eyes:     Conjunctiva/sclera: Conjunctivae normal.  Cardiovascular:     Rate and Rhythm: Normal rate and regular rhythm.  Pulmonary:     Effort: Pulmonary effort is normal.     Breath sounds: Rales present.  Musculoskeletal:     Comments: Pitting edema in the posterior thighs  Skin:    Coloration: Skin is not jaundiced or pale.  Neurological:     Mental Status: She is alert.      Labs    Chemistry Recent Labs  Lab 08/01/24 [phone] 08/02/24 0307 08/02/24 2036 08/03/24 0406 08/04/24 0500  NA 121*   < > 126* 128* 124*  K 4.1   < > 3.3* 3.7 3.7  CL 88*   < > 91* 92* 88*  CO2 23   < > 27 27 27   GLUCOSE 112*   < > 137* 102* 144*  BUN 27*   < > 20 18 17   CREATININE 0.85   < > 0.75 0.75 0.70  CALCIUM  8.4*   < > 8.3* 8.5* 8.6*  PROT 5.4*  --   --  5.1* 5.5*  ALBUMIN  3.3*  --   --  3.1* 3.4*  AST 29  --   --  28 34  ALT 13  --   --  13 17  ALKPHOS 59  --   --  57 64  BILITOT 0.4  --   --  0.4 0.4  GFRNONAA >60   < > >60 >60 >60  ANIONGAP 10   < > 9 8 8    < > = values in this interval not displayed.     Hematology Recent Labs  Lab 08/02/24 0307 08/03/24 0406 08/04/24 0500  WBC 10.1 9.7 11.4*  RBC 2.65* 2.63* 2.74*  HGB 8.5* 8.2* 8.7*  HCT 24.3* 24.0* 24.9*  MCV 91.7 91.3 90.9  MCH 32.1 31.2 31.8  MCHC 35.0 34.2 34.9  RDW 15.8* 15.8* 15.7*  PLT 385 395 443*    Cardiac EnzymesNo results for input(s): TROPONINI in the last 168 hours. No results for input(s): TROPIPOC in the last 168 hours.   BNP Recent Labs  Lab 08/02/24 1551  PROBNP 5,039.0*     DDimer No results for input(s): DDIMER in the last 168 hours.   Radiology    DG CHEST PORT 1 VIEW Result Date: 08/04/2024 EXAM: 1 VIEW(S) XRAY OF THE CHEST 08/04/2024 09:49:00 AM COMPARISON: 08/02/2024 CLINICAL HISTORY: Dyspnea FINDINGS: LINES,  TUBES AND DEVICES: Right PICC line with the tip overlying the expected region of the superior vena cava. Left single lead cardiac defibrillator in place. LUNGS AND PLEURA: Emphysema with chronic coarsened interstitial markings. Increased bibasilar patchy airspace opacities. Grossly stable bilateral pleural effusions. No pneumothorax. HEART AND MEDIASTINUM: Aortic atherosclerosis. BONES AND SOFT TISSUES: No acute osseous abnormality. IMPRESSION: 1. Increased bibasilar patchy airspace opacities. 2. Grossly stable bilateral pleural effusions. Electronically signed by: Morgane Naveau MD 08/04/2024 10:13 AM EST RP Workstation: [id number]   DG Chest 2 View Result Date: 08/02/2024 CLINICAL DATA:  Pleural effusion.  Shortness of breath. EXAM: CHEST - 2 VIEW COMPARISON:  07/24/2024 FINDINGS: Left-sided pacemaker in place. The heart is normal in size. Emphysema with diffuse interstitial coarsening. Small pleural effusions with patchy bibasilar airspace disease, left greater than right, slight worsening. No pneumothorax. IMPRESSION: 1. Small pleural effusions with patchy bibasilar airspace disease, left greater than right, slight worsening from prior. 2. Emphysema with chronic interstitial coarsening. Electronically Signed   By: Andrea Gasman M.D.   On: 08/02/2024 15:38   US  EKG SITE RITE Result Date: 08/02/2024 If Site Rite image not attached, placement could not be confirmed due to current cardiac rhythm.   Cardiac Studies   Echo 08/01/24:    1. Left ventricular ejection fraction, by estimation, is 30 to 35%. The  left ventricle has moderately decreased function. The left ventricle  demonstrates regional wall motion abnormalities (see scoring  diagram/findings for description). Left ventricular   diastolic parameters are indeterminate. Elevated left atrial pressure.   2. Right ventricular systolic function is normal. The right ventricular  size is moderately enlarged. There is mildly elevated pulmonary  artery  systolic pressure.   3. Left atrial size was moderately dilated.   4. Right atrial size was moderately dilated.   5. Large pleural effusion.   6. The mitral valve is degenerative. Trivial mitral valve regurgitation.  No evidence of mitral stenosis. Moderate mitral annular calcification.   7. The aortic valve is abnormal. Aortic valve regurgitation is not  visualized. Aortic valve sclerosis/calcification is present, without any  evidence of aortic stenosis.   8. The inferior vena  cava is normal in size with greater than 50%  respiratory variability, suggesting right atrial pressure of 3 mmHg.   Comparison(s): A prior study was performed on 07/02/24. Prior images  reviewed side by side. Left ventricular ejection fraction and wall motion  abnormalities are slightly worse on today's study.   Patient Profile     74 y.o. female with a history of chronic HFrEF secondary to ischemic cardiomyopathy, CAD with CTO of mid LAD, s/p ICD, former smoker, peripheral arterial disease who was initially planned for outpatient bilateral common femoral enterectomy and right axillofemoral bypass with VVS d/t ischemic rest pain. On preop labs was noted to have a drop in Hb and hyponatremia and referred to the ED and admitted for acute on chronic HF. Underwent R axillofemoral bypass with PTFE and R CFA endarterectomy on 12/29 but has had volume overload issues post operatively.   Assessment & Plan   Acute hypoxemic respiratory failure, suspect a combination of a COPD exacerbation and now possibly pneumonia with acute on chronic systolic HF - still requiring O2. Viral panel negative. CXR today with worsening patchy airspace opacities. Discussed with Dr. Trixie who will start antibiotics  Acute on chronic systolic HF - PICC placed and CVP and pro BNP checked. CVP 20s at bedside. Weight not checked. Good output with lasix  80 x 1 yesterday but still volume overloaded on exam. On digoxin .  Inaccurate I/O. Will  give an additional dose of Lasix  80 mg IV BID today   Ischemic cardiomyopathy   CAD and NSTEMI with CTO of LAD with collaterals and severe pLCx/OM1 stenosis on cath   Hyponatremia - given tolvaptan  and lasix . Improving  PAD s/p R axillofemoral bypass with PTFE and R CFA endarterectomy on 12/29   Post op anemia      For questions or updates, please contact [facility] Please consult [email or web address] for contact info under        Signed, Emeline Calender, DO 08/04/2024, 11:16 AM    "

## 2024-08-04 NOTE — Progress Notes (Signed)
 Pharmacy Antibiotic Note  Pam [name] is a 74 y.o. female admitted on 07/24/2024 with pneumonia. Patient with worsening opacities on CXR and still requiring O2. Pharmacy has been consulted for Zosyn  dosing.  Plan: Initiate Zosyn  3.375g IV q8h Monitor respiratory status for length of therapy and/or de-escalation F/u MRSA PCR  Height: 5' 3 (160 cm) Weight:  (unable to get weight due to bed malfunction) IBW/kg (Calculated) : 52.4  Temp (24hrs), Avg:97.7 F (36.5 C), Min:97.5 F (36.4 C), Max:98 F (36.7 C)  Recent Labs  Lab 07/31/24 0427 08/01/24 0817 08/02/24 0307 08/02/24 2036 08/03/24 0406 08/04/24 0500  WBC 13.7* 12.6* 10.1  --  9.7 11.4*  CREATININE 1.13* 0.85 0.88 0.75 0.75 0.70    Estimated Creatinine Clearance: 51.8 mL/min (by C-G formula based on SCr of 0.7 mg/dL).    Allergies[1]  Microbiology results: 1/2 respiratory virus panel negative  Thank you for allowing pharmacy to be a part of this patients care.  Izetta Carl, PharmD PGY1 Pharmacy Resident    [1]  Allergies Allergen Reactions   Lisinopril  Cough    Dry cough

## 2024-08-05 ENCOUNTER — Inpatient Hospital Stay (HOSPITAL_COMMUNITY)

## 2024-08-05 DIAGNOSIS — I472 Ventricular tachycardia, unspecified: Secondary | ICD-10-CM

## 2024-08-05 DIAGNOSIS — R55 Syncope and collapse: Secondary | ICD-10-CM

## 2024-08-05 DIAGNOSIS — I5020 Unspecified systolic (congestive) heart failure: Secondary | ICD-10-CM | POA: Diagnosis not present

## 2024-08-05 DIAGNOSIS — R0902 Hypoxemia: Secondary | ICD-10-CM

## 2024-08-05 DIAGNOSIS — R0603 Acute respiratory distress: Secondary | ICD-10-CM | POA: Diagnosis not present

## 2024-08-05 LAB — CBC
HCT: 17.1 % — ABNORMAL LOW (ref 36.0–46.0)
HCT: 25.9 % — ABNORMAL LOW (ref 36.0–46.0)
HCT: 29.1 % — ABNORMAL LOW (ref 36.0–46.0)
Hemoglobin: 5.9 g/dL — CL (ref 12.0–15.0)
Hemoglobin: 9 g/dL — ABNORMAL LOW (ref 12.0–15.0)
Hemoglobin: 9.8 g/dL — ABNORMAL LOW (ref 12.0–15.0)
MCH: 31.7 pg (ref 26.0–34.0)
MCH: 31.5 pg (ref 26.0–34.0)
MCH: 31.3 pg (ref 26.0–34.0)
MCHC: 34.5 g/dL (ref 30.0–36.0)
MCHC: 34.7 g/dL (ref 30.0–36.0)
MCHC: 33.7 g/dL (ref 30.0–36.0)
MCV: 91.9 fL (ref 80.0–100.0)
MCV: 90.6 fL (ref 80.0–100.0)
MCV: 93 fL (ref 80.0–100.0)
Platelets: 285 K/uL (ref 150–400)
Platelets: 486 K/uL — ABNORMAL HIGH (ref 150–400)
Platelets: 585 K/uL — ABNORMAL HIGH (ref 150–400)
RBC: 1.86 MIL/uL — ABNORMAL LOW (ref 3.87–5.11)
RBC: 2.86 MIL/uL — ABNORMAL LOW (ref 3.87–5.11)
RBC: 3.13 MIL/uL — ABNORMAL LOW (ref 3.87–5.11)
RDW: 15.7 % — ABNORMAL HIGH (ref 11.5–15.5)
RDW: 15.5 % (ref 11.5–15.5)
RDW: 15.5 % (ref 11.5–15.5)
WBC: 8.7 K/uL (ref 4.0–10.5)
WBC: 12.4 K/uL — ABNORMAL HIGH (ref 4.0–10.5)
WBC: 10.3 K/uL (ref 4.0–10.5)
nRBC: 0 % (ref 0.0–0.2)
nRBC: 0 % (ref 0.0–0.2)
nRBC: 0.3 % — ABNORMAL HIGH (ref 0.0–0.2)

## 2024-08-05 LAB — POCT I-STAT 7, (LYTES, BLD GAS, ICA,H+H)
Acid-base deficit: 1 mmol/L (ref 0.0–2.0)
Bicarbonate: 23.5 mmol/L (ref 20.0–28.0)
Calcium, Ion: 1.07 mmol/L — ABNORMAL LOW (ref 1.15–1.40)
HCT: 29 % — ABNORMAL LOW (ref 36.0–46.0)
Hemoglobin: 9.9 g/dL — ABNORMAL LOW (ref 12.0–15.0)
O2 Saturation: 99 %
Patient temperature: 97.2
Potassium: 3.9 mmol/L (ref 3.5–5.1)
Sodium: 121 mmol/L — ABNORMAL LOW (ref 135–145)
TCO2: 25 mmol/L (ref 22–32)
pCO2 arterial: 35.1 mmHg (ref 32–48)
pH, Arterial: 7.43 (ref 7.35–7.45)
pO2, Arterial: 132 mmHg — ABNORMAL HIGH (ref 83–108)

## 2024-08-05 LAB — BASIC METABOLIC PANEL WITH GFR
Anion gap: 10 (ref 5–15)
Anion gap: 10 (ref 5–15)
Anion gap: 16 — ABNORMAL HIGH (ref 5–15)
BUN: 16 mg/dL (ref 8–23)
BUN: 18 mg/dL (ref 8–23)
BUN: 23 mg/dL (ref 8–23)
CO2: 24 mmol/L (ref 22–32)
CO2: 25 mmol/L (ref 22–32)
CO2: 23 mmol/L (ref 22–32)
Calcium: 7 mg/dL — ABNORMAL LOW (ref 8.9–10.3)
Calcium: 7.7 mg/dL — ABNORMAL LOW (ref 8.9–10.3)
Calcium: 8.7 mg/dL — ABNORMAL LOW (ref 8.9–10.3)
Chloride: 94 mmol/L — ABNORMAL LOW (ref 98–111)
Chloride: 90 mmol/L — ABNORMAL LOW (ref 98–111)
Chloride: 83 mmol/L — ABNORMAL LOW (ref 98–111)
Creatinine, Ser: 0.63 mg/dL (ref 0.44–1.00)
Creatinine, Ser: 0.71 mg/dL (ref 0.44–1.00)
Creatinine, Ser: 1.2 mg/dL — ABNORMAL HIGH (ref 0.44–1.00)
GFR, Estimated: >60 mL/min
GFR, Estimated: >60 mL/min
GFR, Estimated: 48 mL/min — ABNORMAL LOW
Glucose, Bld: 132 mg/dL — ABNORMAL HIGH (ref 70–99)
Glucose, Bld: 130 mg/dL — ABNORMAL HIGH (ref 70–99)
Glucose, Bld: 281 mg/dL — ABNORMAL HIGH (ref 70–99)
Potassium: 2.5 mmol/L — CL (ref 3.5–5.1)
Potassium: 3.2 mmol/L — ABNORMAL LOW (ref 3.5–5.1)
Potassium: 5.2 mmol/L — ABNORMAL HIGH (ref 3.5–5.1)
Sodium: 128 mmol/L — ABNORMAL LOW (ref 135–145)
Sodium: 125 mmol/L — ABNORMAL LOW (ref 135–145)
Sodium: 121 mmol/L — ABNORMAL LOW (ref 135–145)

## 2024-08-05 LAB — EXPECTORATED SPUTUM ASSESSMENT W GRAM STAIN, RFLX TO RESP C

## 2024-08-05 LAB — MAGNESIUM
Magnesium: 1.5 mg/dL — ABNORMAL LOW (ref 1.7–2.4)
Magnesium: 1.6 mg/dL — ABNORMAL LOW (ref 1.7–2.4)
Magnesium: 2.8 mg/dL — ABNORMAL HIGH (ref 1.7–2.4)

## 2024-08-05 LAB — COOXEMETRY PANEL
Carboxyhemoglobin: 1.6 % — ABNORMAL HIGH (ref 0.5–1.5)
Methemoglobin: 1.7 % — ABNORMAL HIGH (ref 0.0–1.5)
O2 Saturation: 52.8 %
Total hemoglobin: 8.7 g/dL — ABNORMAL LOW (ref 12.0–16.0)

## 2024-08-05 LAB — GLUCOSE, CAPILLARY: Glucose-Capillary: 270 mg/dL — ABNORMAL HIGH (ref 70–99)

## 2024-08-05 LAB — TROPONIN T, HIGH SENSITIVITY: Troponin T High Sensitivity: 210 ng/L (ref 0–19)

## 2024-08-05 LAB — LACTIC ACID, PLASMA: Lactic Acid, Venous: 5.7 mmol/L (ref 0.5–1.9)

## 2024-08-05 MED ORDER — ACETYLCYSTEINE 20 % IN SOLN
4.0000 mL | Freq: Three times a day (TID) | RESPIRATORY_TRACT | Status: DC
  Administered 2024-08-06 – 2024-08-08 (×9): 4 mL via RESPIRATORY_TRACT
  Filled 2024-08-05 (×14): qty 4

## 2024-08-05 MED ORDER — METHYLPREDNISOLONE SODIUM SUCC 40 MG IJ SOLR
40.0000 mg | Freq: Two times a day (BID) | INTRAMUSCULAR | Status: DC
  Administered 2024-08-05 – 2024-08-06 (×2): 40 mg via INTRAVENOUS
  Filled 2024-08-05 (×2): qty 1

## 2024-08-05 MED ORDER — SODIUM CHLORIDE 3 % IN NEBU
4.0000 mL | INHALATION_SOLUTION | Freq: Two times a day (BID) | RESPIRATORY_TRACT | Status: AC
  Administered 2024-08-05 – 2024-08-07 (×6): 4 mL via RESPIRATORY_TRACT
  Filled 2024-08-05 (×7): qty 4

## 2024-08-05 MED ORDER — HEPARIN SODIUM (PORCINE) 5000 UNIT/ML IJ SOLN
5000.0000 [IU] | Freq: Three times a day (TID) | INTRAMUSCULAR | Status: DC
  Administered 2024-08-05: 5000 [IU] via SUBCUTANEOUS
  Filled 2024-08-05 (×2): qty 1

## 2024-08-05 MED ORDER — HEPARIN SODIUM (PORCINE) 5000 UNIT/ML IJ SOLN: 5000.0000 [IU] | Freq: Three times a day (TID) | INTRAMUSCULAR | Status: DC

## 2024-08-05 MED ORDER — COLLAGENASE 250 UNIT/GM EX OINT
TOPICAL_OINTMENT | Freq: Every day | CUTANEOUS | Status: DC
  Filled 2024-08-05 (×2): qty 30

## 2024-08-05 MED ORDER — GUAIFENESIN-DM 100-10 MG/5ML PO SYRP: 5.0000 mL | ORAL_SOLUTION | ORAL | Status: DC | PRN

## 2024-08-05 MED ORDER — AMIODARONE HCL IN DEXTROSE 360-4.14 MG/200ML-% IV SOLN
30.0000 mg/h | INTRAVENOUS | Status: DC
  Administered 2024-08-06 (×3): 30 mg/h via INTRAVENOUS
  Filled 2024-08-05 (×3): qty 200

## 2024-08-05 MED ORDER — POLYETHYLENE GLYCOL 3350 17 G PO PACK: 17.0000 g | PACK | Freq: Every day | ORAL | Status: DC | PRN

## 2024-08-05 MED ORDER — MAGNESIUM SULFATE 4 GM/100ML IV SOLN
4.0000 g | Freq: Once | INTRAVENOUS | Status: DC
  Filled 2024-08-05: qty 100

## 2024-08-05 MED ORDER — AMIODARONE HCL IN DEXTROSE 360-4.14 MG/200ML-% IV SOLN
INTRAVENOUS | Status: AC
  Filled 2024-08-05: qty 200

## 2024-08-05 MED ORDER — FUROSEMIDE 10 MG/ML IJ SOLN
10.0000 mg/h | INTRAVENOUS | Status: AC
  Administered 2024-08-05 – 2024-08-06 (×2): 10 mg/h via INTRAVENOUS
  Filled 2024-08-05: qty 200
  Filled 2024-08-05: qty 20
  Filled 2024-08-05: qty 200

## 2024-08-05 MED ORDER — IPRATROPIUM-ALBUTEROL 0.5-2.5 (3) MG/3ML IN SOLN: 3.0000 mL | Freq: Four times a day (QID) | RESPIRATORY_TRACT | Status: DC

## 2024-08-05 MED ORDER — MILRINONE LACTATE IN DEXTROSE 20-5 MG/100ML-% IV SOLN
0.1250 ug/kg/min | INTRAVENOUS | Status: DC
  Administered 2024-08-05: 0.125 ug/kg/min via INTRAVENOUS
  Filled 2024-08-05: qty 100

## 2024-08-05 MED ORDER — AMIODARONE LOAD VIA INFUSION
150.0000 mg | INTRAVENOUS | Status: AC
  Administered 2024-08-05: 150 mg via INTRAVENOUS
  Filled 2024-08-05: qty 83.34

## 2024-08-05 MED ORDER — MAGNESIUM SULFATE 4 GM/100ML IV SOLN
4.0000 g | Freq: Once | INTRAVENOUS | Status: AC
  Administered 2024-08-05: 4 g via INTRAVENOUS
  Filled 2024-08-05: qty 100

## 2024-08-05 MED ORDER — POTASSIUM CHLORIDE 10 MEQ/50ML IV SOLN
10.0000 meq | Freq: Once | INTRAVENOUS | Status: AC
  Administered 2024-08-05: 10 meq via INTRAVENOUS
  Filled 2024-08-05: qty 50

## 2024-08-05 MED ORDER — SENNA 8.6 MG PO TABS: 1.0000 | ORAL_TABLET | Freq: Two times a day (BID) | ORAL | Status: DC | PRN

## 2024-08-05 MED ORDER — FUROSEMIDE 10 MG/ML IJ SOLN
20.0000 mg | INTRAMUSCULAR | Status: AC
  Administered 2024-08-05: 20 mg via INTRAVENOUS

## 2024-08-05 MED ORDER — FUROSEMIDE 10 MG/ML IJ SOLN
60.0000 mg | INTRAMUSCULAR | Status: AC
  Administered 2024-08-05: 60 mg via INTRAVENOUS

## 2024-08-05 MED ORDER — POTASSIUM CHLORIDE 10 MEQ/50ML IV SOLN
10.0000 meq | INTRAVENOUS | Status: DC
  Filled 2024-08-05 (×3): qty 50

## 2024-08-05 MED ORDER — POTASSIUM CHLORIDE 20 MEQ PO PACK
40.0000 meq | PACK | ORAL | Status: AC
  Administered 2024-08-05 (×3): 40 meq via ORAL
  Filled 2024-08-05 (×3): qty 2

## 2024-08-05 MED ORDER — CALCIUM GLUCONATE-NACL 2-0.675 GM/100ML-% IV SOLN
2.0000 g | Freq: Once | INTRAVENOUS | Status: AC
  Administered 2024-08-06: 2000 mg via INTRAVENOUS
  Filled 2024-08-05: qty 100

## 2024-08-05 MED ORDER — IPRATROPIUM-ALBUTEROL 0.5-2.5 (3) MG/3ML IN SOLN
3.0000 mL | RESPIRATORY_TRACT | Status: DC
  Administered 2024-08-05 – 2024-08-09 (×25): 3 mL via RESPIRATORY_TRACT
  Filled 2024-08-05 (×26): qty 3

## 2024-08-05 MED ORDER — LEVETIRACETAM (KEPPRA) 500 MG/5 ML ADULT IV PUSH
1500.0000 mg | INTRAVENOUS | Status: AC
  Administered 2024-08-05: 1500 mg via INTRAVENOUS
  Filled 2024-08-05: qty 15

## 2024-08-05 MED ORDER — SENNOSIDES-DOCUSATE SODIUM 8.6-50 MG PO TABS: 1.0000 | ORAL_TABLET | Freq: Every evening | ORAL | Status: DC | PRN

## 2024-08-05 MED ORDER — POTASSIUM CHLORIDE 10 MEQ/50ML IV SOLN
10.0000 meq | INTRAVENOUS | Status: DC
  Filled 2024-08-05 (×6): qty 50

## 2024-08-05 MED ORDER — PREDNISONE 20 MG PO TABS: 40.0000 mg | ORAL_TABLET | Freq: Every day | ORAL | Status: DC

## 2024-08-05 MED ORDER — METOPROLOL TARTRATE 5 MG/5ML IV SOLN
INTRAVENOUS | Status: AC
  Administered 2024-08-05: 2.5 mg
  Filled 2024-08-05: qty 5

## 2024-08-05 MED ORDER — INSULIN ASPART 100 UNIT/ML IJ SOLN
0.0000 [IU] | INTRAMUSCULAR | Status: DC
  Administered 2024-08-06: 2 [IU] via SUBCUTANEOUS
  Administered 2024-08-06: 3 [IU] via SUBCUTANEOUS
  Administered 2024-08-06: 2 [IU] via SUBCUTANEOUS
  Administered 2024-08-06: 3 [IU] via SUBCUTANEOUS
  Administered 2024-08-06: 2 [IU] via SUBCUTANEOUS
  Administered 2024-08-07: 5 [IU] via SUBCUTANEOUS
  Administered 2024-08-07 (×2): 2 [IU] via SUBCUTANEOUS
  Administered 2024-08-08: 3 [IU] via SUBCUTANEOUS
  Administered 2024-08-08 (×2): 2 [IU] via SUBCUTANEOUS
  Filled 2024-08-05 (×3): qty 2
  Filled 2024-08-05: qty 3
  Filled 2024-08-05: qty 2
  Filled 2024-08-05: qty 7
  Filled 2024-08-05: qty 2
  Filled 2024-08-05: qty 5
  Filled 2024-08-05 (×2): qty 2

## 2024-08-05 MED ORDER — AMIODARONE HCL IN DEXTROSE 360-4.14 MG/200ML-% IV SOLN
30.0000 mg/h | INTRAVENOUS | Status: AC
  Administered 2024-08-05: 30 mg/h via INTRAVENOUS
  Filled 2024-08-05: qty 200

## 2024-08-05 MED ORDER — METHYLPREDNISOLONE SODIUM SUCC 40 MG IJ SOLR
40.0000 mg | Freq: Once | INTRAMUSCULAR | Status: AC
  Administered 2024-08-05: 40 mg via INTRAVENOUS
  Filled 2024-08-05: qty 1

## 2024-08-05 NOTE — TOC Initial Note (Signed)
 Transition of Care [facility]) - Initial/Assessment Note    Patient Details  Name: Pam [name] MRN: [id number] Date of Birth: 09-Nov-1950  Transition of [facility]) CM/SW Contact:    Arlana JINNY Nicholaus ISRAEL Phone Number: [phone] 08/05/2024, 2:23 PM  Clinical Narrative:    HF CSW spoke with patient who stated that she lives at home with spouse. Patient stated that she drives. Patient stated that she has no history of HH services. Patient stated that she does not use any equipment. Patient stated that she has a scale at home. Patient stated that she has a PCP. CSW explained that typically hospital follow up appointments are scheduled closer towards dc. Patient is agreeable.   HF CSW/CM will continue to follow and monitor for dc readiness.                      Patient Goals and CMS Choice            Expected Discharge Plan and Services                                              Prior Living Arrangements/Services                       Activities of Daily Living   ADL Screening (condition at time of admission) Independently performs ADLs?: Yes (appropriate for developmental age) Is the patient deaf or have difficulty hearing?: Yes Does the patient have difficulty seeing, even when wearing glasses/contacts?: No Does the patient have difficulty concentrating, remembering, or making decisions?: No  Permission Sought/Granted                  Emotional Assessment              Admission diagnosis:  Chronic systolic heart failure (HCC) [I50.22] Hyponatremia [E87.1] Peripheral vascular disease [I73.9] Hypothyroidism, adult [E03.9] Hyperlipidemia LDL goal <70 [E78.5] Acute respiratory failure with hypoxia (HCC) [J96.01] Centrilobular emphysema (HCC) [J43.2] Heart failure with reduced ejection fraction (HCC) [I50.20] 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] Acute congestive heart failure,  unspecified heart failure type (HCC) [I50.9] Acute on chronic congestive heart failure, unspecified heart failure type (HCC) [I50.9] Limb ischemia [I99.8] Patient Active Problem List   Diagnosis Date Noted   Limb ischemia 07/29/2024   Heart failure with reduced ejection fraction (HCC) 07/24/2024   Pressure injury of skin 07/24/2024   Critical limb ischemia of right lower extremity (HCC) 07/24/2024   COPD (chronic obstructive pulmonary disease) (HCC) 07/24/2024   History of primary hyperparathyroidism 05/31/2023   History of parathyroid  surgery 05/31/2023   Stage 3a chronic kidney disease (HCC) 12/08/2022   HFrEF (heart failure with reduced ejection fraction) (HCC)    Shortness of breath    Acute on chronic systolic CHF (congestive heart failure) (HCC) 04/27/2022   Elevated troponin 04/27/2022   Sepsis (HCC) 04/27/2022   CAP (community acquired pneumonia) 04/27/2022   Thrombocytosis 04/27/2022   Acute respiratory failure with hypoxia (HCC) 04/26/2022   History of colonic polyps    Adenomatous polyp of ascending colon    Hyperparathyroidism, primary 02/02/2021   Primary hyperparathyroidism 02/02/2021   Osteoporosis without current pathological fracture 10/09/2019   Palpitations 08/28/2017   Essential hypertension 03/03/2017   Chest pain 08/29/2016   Chronic systolic heart failure (HCC) 08/29/2016  Cardiomyopathy, ischemic    Hyponatremia    Facial laceration    Coronary artery disease of native artery of native heart with stable angina pectoris    Hyperlipidemia LDL goal <70 07/08/2016   Hypothyroidism 07/08/2016   Alcoholic intoxication without complication    Fall 07/07/2016   Abnormal EKG    PCP:  Andrew Truman GRADE., MD Pharmacy:   [facility] [phone] [address], [place] - [address] AT [address] [place] [address] Phone: [phone] Fax: [phone]     Social Drivers of Health (SDOH) Social History: SDOH  Screenings   Food Insecurity: No Food Insecurity (07/25/2024)  Housing: Unknown (07/25/2024)  Transportation Needs: No Transportation Needs (07/25/2024)  Utilities: Not At Risk (07/25/2024)  Alcohol Screen: Low Risk (04/29/2022)  Financial Resource Strain: Low Risk (04/29/2022)  Social Connections: Unknown (07/30/2024)  Tobacco Use: Medium Risk (07/29/2024)   SDOH Interventions:     Readmission Risk Interventions     No data to display

## 2024-08-05 NOTE — Progress Notes (Signed)
 " PROGRESS NOTE  Pam [name] FMW:[id number] DOB: [birth year] DOA: 07/24/2024 PCP: Andrew Truman GRADE., MD   LOS: 12 days   Brief Narrative / Interim history: 74 year old female with chronic systolic CHF with EF 35-40%, ICD, CAD, HTN, HLD, PAD, who comes into the hospital with leg pain.  She has known PAD and vascular surgery was consulted, and is now s/p right sided axillobifemoral bypass using 8 mm ringed PTFE & right common femoral endarterectomy on 12/29.  Hospital course complicated by acute on chronic hyponatremia and fluid overload  Subjective / 24h Interval events: Continues to complain of significant dyspnea.  She is also complaining of buttocks pain from laying in bed.  She has difficulties getting up and ambulating due to significant shortness of breath  Assesement and Plan: Principal problem Acute hypoxemic respiratory failure due to acute on chronic combined CHF, possible multifocal pneumonia-patient with significant ongoing dyspnea, currently on 4 L. Most recent 2D echo done beginning of December 2025 showed LVEF 35-40%, grade 1 diastolic dysfunction.  RV systolic function and size were normal.  Repeat 2D echo done 1/1 showed LVEF 30-35%, with wall motion abnormalities.  RV systolic function was normal in size with slightly enlarged - Heart failure team consulted, appreciate input.  Continue IV diuresis, placed on milrinone   Active problems Critical limb ischemia -vascular surgery consulted and followed patient while hospitalized.  She is now status post right sided axillobifemoral bypass using 8 mm ringed PTFE & right common femoral endarterectomy on 12/29. - Back on Plavix , continue along with aspirin , continue statin  Possible multifocal pneumonia -with increasing shortness of breath, slightly increasing leukocytosis, purulent sputum production it is possible that she has developed pneumonia.  Started Zosyn  on 1/4, continue - Continue nebulizers - Was started on steroids,  however I do not appreciate any wheezing  Hyponatremia -subacute on chronic, her baseline is in the mid upper 120s.  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 - Received tolvaptan  1/2, sodium now 128.  Hypothyroidism-continue Synthroid   History of COPD-she has no wheezing, but due to pneumonia started on steroids  Hypokalemia-low at 2.5, replete, repeat BMP pending  Hypotension-now on milrinone   Scheduled Meds:  aspirin  EC  81 mg Oral Q0600   atorvastatin   80 mg Oral Daily   Chlorhexidine  Gluconate Cloth  6 each Topical Daily   clopidogrel   75 mg Oral Q0600   collagenase    Topical Daily   digoxin   0.0625 mg Oral Daily   enoxaparin  (LOVENOX ) injection  40 mg Subcutaneous Q24H   feeding supplement  237 mL Oral TID WC   furosemide   80 mg Intravenous BID   ipratropium-albuterol   3 mL Nebulization Q4H   levothyroxine   37.5 mcg Oral Q0600   polyethylene glycol  17 g Oral BID   potassium chloride   40 mEq Oral Q3H   [START ON 08/06/2024] predniSONE   40 mg Oral Q breakfast   ranolazine   1,000 mg Oral BID   senna-docusate  1 tablet Oral BID   sodium chloride  flush  10-40 mL Intracatheter Q12H   sodium chloride  flush  3 mL Intravenous Q12H   sodium chloride  HYPERTONIC  4 mL Nebulization BID   umeclidinium-vilanterol  1 puff Inhalation Daily   valACYclovir   1,000 mg Oral Daily   Continuous Infusions:  milrinone  0.125 mcg/kg/min (08/05/24 1212)   piperacillin -tazobactam (ZOSYN )  IV 3.375 g (08/05/24 1306)   PRN Meds:.acetaminophen , calcium  carbonate, guaiFENesin -dextromethorphan, HYDROmorphone  (DILAUDID ) injection,  ondansetron  (ZOFRAN ) IV, oxyCODONE -acetaminophen , phenol, potassium chloride , sodium chloride  flush, sodium chloride  flush  Current Outpatient Medications  Medication Instructions   acetaminophen  (TYLENOL ) 1,000 mg, Every 6 hours PRN   albuterol  (VENTOLIN  HFA) 108 (90 Base) MCG/ACT inhaler 2 puffs,  Inhalation, Every 6 hours PRN   aspirin  81 mg, Daily   atorvastatin  (LIPITOR ) 80 MG tablet TAKE 1 TABLET(80 MG) BY MOUTH DAILY   bisoprolol  (ZEBETA ) 2.5 mg, Oral, Daily   CALCIUM  PO 1 tablet, Daily   clopidogrel  (PLAVIX ) 75 MG tablet TAKE 1 TABLET(75 MG) BY MOUTH DAILY WITH BREAKFAST   digoxin  (LANOXIN ) 0.0625 mg, Oral, Daily   furosemide  (LASIX ) 20 mg, Oral, As needed   levothyroxine  (SYNTHROID ) 25 MCG tablet Take 37.5 mcg by mouth daily before breakfast.   nitroGLYCERIN  (NITROSTAT ) 0.4 mg, Sublingual, Every 5 min x3 PRN   nystatin cream (MYCOSTATIN) 1 Application, 2 times daily PRN   ranolazine  (RANEXA ) 1000 MG SR tablet TAKE 1 TABLET(1000 MG) BY MOUTH TWICE DAILY   sacubitril -valsartan  (ENTRESTO ) 49-51 MG 1 tablet, Oral, 2 times daily   spironolactone  (ALDACTONE ) 25 MG tablet TAKE 1 TABLET(25 MG) BY MOUTH DAILY   umeclidinium-vilanterol (ANORO ELLIPTA ) 62.5-25 MCG/ACT AEPB 1 puff, Inhalation, Daily   valACYclovir  (VALTREX ) 1,000 mg, Daily   Vitamin D3 2,000 Units, Daily    Diet Orders (From admission, onward)     Start     Ordered   07/30/24 1200  Diet renal/carb modified with fluid restriction Diet-HS Snack? Nothing; Fluid restriction: 1200 mL Fluid; Room service appropriate? Yes; Fluid consistency: Thin  Diet effective now       Question Answer Comment  Diet-HS Snack? Nothing   Fluid restriction: 1200 mL Fluid   Room service appropriate? Yes   Fluid consistency: Thin      07/30/24 1159            DVT prophylaxis: PLACE TED HOSE Start: 07/31/24 1415 enoxaparin  (LOVENOX ) injection 40 mg Start: 07/30/24 1000 SCD's Start: 07/29/24 1604   Lab Results  Component Value Date   PLT 486 (H) 08/05/2024      Code Status: Full Code  Family Communication: Husband at bedside  Status is: Inpatient Remains inpatient appropriate because: Hyponatremia   Level of care: Progressive  Consultants:  Vascular surgery  Objective: Vitals:   08/05/24 0858 08/05/24 1128 08/05/24  1139 08/05/24 1218  BP: (!) 125/52   (!) 129/57  Pulse: 83 84  83  Resp: 19 20  20   Temp: 98 F (36.7 C)   97.7 F (36.5 C)  TempSrc: Oral   Oral  SpO2: 93% 98% 100% 98%  Weight:      Height:        Intake/Output Summary (Last 24 hours) at 08/05/2024 1323 Last data filed at 08/05/2024 1202 Gross per 24 hour  Intake --  Output 1900 ml  Net -1900 ml   Wt Readings from Last 3 Encounters:  07/23/24 49.9 kg  07/16/24 50.4 kg  07/11/24 49.9 kg    Examination:  Constitutional: Uncomfortable, very tachypneic, pursed lip breathing Eyes: lids and conjunctivae normal, no scleral icterus ENMT: mmm Neck: normal, supple Respiratory: Bilateral rhonchi, increased respiratory effort Cardiovascular: Regular rate and rhythm, no murmurs / rubs / gallops.  Trace LE edema. Abdomen: soft, no distention, no tenderness. Bowel sounds positive.  Skin: no rashes   Data Reviewed: I have independently reviewed following labs and imaging studies   CBC Recent Labs  Lab 08/02/24 0307 08/03/24 0406 08/04/24 0500 08/05/24 1015 08/05/24 1214  WBC 10.1 9.7 11.4* 8.7 12.4*  HGB 8.5* 8.2* 8.7* 5.9* 9.0*  HCT 24.3* 24.0* 24.9* 17.1* 25.9*  PLT 385 395 443* 285 486*  MCV 91.7 91.3 90.9 91.9 90.6  MCH 32.1 31.2 31.8 31.7 31.5  MCHC 35.0 34.2 34.9 34.5 34.7  RDW 15.8* 15.8* 15.7* 15.7* 15.5    Recent Labs  Lab 08/01/24 0817 08/02/24 0307 08/02/24 1247 08/02/24 2036 08/03/24 0406 08/04/24 0500 08/05/24 1015  NA 121* 121*  --  126* 128* 124* 128*  K 4.1 3.9  --  3.3* 3.7 3.7 2.5*  CL 88* 88*  --  91* 92* 88* 94*  CO2 23 26  --  27 27 27 24   GLUCOSE 112* 99  --  137* 102* 144* 132*  BUN 27* 21  --  20 18 17 16   CREATININE 0.85 0.88  --  0.75 0.75 0.70 0.63  CALCIUM  8.4* 7.9*  --  8.3* 8.5* 8.6* 7.0*  AST 29  --   --   --  28 34  --   ALT 13  --   --   --  13 17  --   ALKPHOS 59  --   --   --  57 64  --   BILITOT 0.4  --   --   --  0.4 0.4  --   ALBUMIN  3.3*  --   --   --  3.1* 3.4*  --    MG 1.9 1.8  --   --  2.1 1.9 1.5*  PROCALCITON  --   --  <0.10  --   --   --   --     ------------------------------------------------------------------------------------------------------------------ No results for input(s): CHOL, HDL, LDLCALC, TRIG, CHOLHDL, LDLDIRECT in the last 72 hours.   Lab Results  Component Value Date   HGBA1C 5.4 05/30/2024   ------------------------------------------------------------------------------------------------------------------ No results for input(s): TSH, T4TOTAL, T3FREE, THYROIDAB in the last 72 hours.  Invalid input(s): FREET3  Cardiac Enzymes No results for input(s): CKMB, TROPONINI, MYOGLOBIN in the last 168 hours.  Invalid input(s): CK ------------------------------------------------------------------------------------------------------------------    Component Value Date/Time   BNP 841.1 (H) 05/29/2024 1206    CBG: No results for input(s): GLUCAP in the last 168 hours.   Recent Results (from the past 240 hours)  Respiratory (~20 pathogens) panel by PCR     Status: None   Collection Time: 08/02/24 12:34 PM   Specimen: Nasopharyngeal Swab; Respiratory  Result Value Ref Range Status   Adenovirus NOT DETECTED NOT DETECTED Final   Coronavirus 229E NOT DETECTED NOT DETECTED Final    Comment: (NOTE) The Coronavirus on the Respiratory Panel, DOES NOT test for the novel  Coronavirus (2019 nCoV)    Coronavirus HKU1 NOT DETECTED NOT DETECTED Final   Coronavirus NL63 NOT DETECTED NOT DETECTED Final   Coronavirus OC43 NOT DETECTED NOT DETECTED Final   Metapneumovirus NOT DETECTED NOT DETECTED Final   Rhinovirus / Enterovirus NOT DETECTED NOT DETECTED Final   Influenza A NOT DETECTED NOT DETECTED Final   Influenza B NOT DETECTED NOT DETECTED Final   Parainfluenza Virus 1 NOT DETECTED NOT DETECTED Final   Parainfluenza Virus 2 NOT DETECTED NOT DETECTED Final   Parainfluenza Virus 3 NOT DETECTED NOT  DETECTED Final   Parainfluenza Virus 4 NOT DETECTED NOT DETECTED Final   Respiratory Syncytial Virus NOT DETECTED NOT DETECTED Final   Bordetella pertussis NOT DETECTED NOT DETECTED Final   Bordetella Parapertussis NOT DETECTED NOT DETECTED Final   Chlamydophila pneumoniae NOT  DETECTED NOT DETECTED Final   Mycoplasma pneumoniae NOT DETECTED NOT DETECTED Final    Comment: Performed at [facility], [address] [place], [place] [address]  Resp panel by RT-PCR (RSV, Flu A&B, Covid) Anterior Nasal Swab     Status: None   Collection Time: 08/02/24 12:34 PM   Specimen: Anterior Nasal Swab  Result Value Ref Range Status   SARS Coronavirus 2 by RT PCR NEGATIVE NEGATIVE Final   Influenza A by PCR NEGATIVE NEGATIVE Final   Influenza B by PCR NEGATIVE NEGATIVE Final    Comment: (NOTE) The Xpert Xpress SARS-CoV-2/FLU/RSV plus assay is intended as an aid in the diagnosis of influenza from Nasopharyngeal swab specimens and should not be used as a sole basis for treatment. Nasal washings and aspirates are unacceptable for Xpert Xpress SARS-CoV-2/FLU/RSV testing.  Fact Sheet for Patients: bloggercourse.com  Fact Sheet for Healthcare Providers: seriousbroker.it  This test is not yet approved or cleared by the [place]  FDA and has been authorized for detection and/or diagnosis of SARS-CoV-2 by FDA under an Emergency Use Authorization (EUA). This EUA will remain in effect (meaning this test can be used) for the duration of the COVID-19 declaration under Section 564(b)(1) of the Act, 21 U.S.C. section 360bbb-3(b)(1), unless the authorization is terminated or revoked.     Resp Syncytial Virus by PCR NEGATIVE NEGATIVE Final    Comment: (NOTE) Fact Sheet for Patients: bloggercourse.com  Fact Sheet for Healthcare Providers: seriousbroker.it  This test is not yet approved or cleared by  the [place]  FDA and has been authorized for detection and/or diagnosis of SARS-CoV-2 by FDA under an Emergency Use Authorization (EUA). This EUA will remain in effect (meaning this test can be used) for the duration of the COVID-19 declaration under Section 564(b)(1) of the Act, 21 U.S.C. section 360bbb-3(b)(1), unless the authorization is terminated or revoked.  Performed at [facility], [address] [place], [place] [address]   Expectorated Sputum Assessment w Gram Stain, Rflx to Resp Cult     Status: None   Collection Time: 08/04/24 11:48 AM   Specimen: Sputum  Result Value Ref Range Status   Specimen Description SPU  Final   Special Requests SPU  Final   Sputum evaluation   Final    THIS SPECIMEN IS ACCEPTABLE FOR SPUTUM CULTURE Performed at [facility], [address] [place], [place] [address]    Report Status 08/05/2024 FINAL  Final  Culture, Respiratory w Gram Stain     Status: None (Preliminary result)   Collection Time: 08/04/24 11:48 AM   Specimen: Sputum  Result Value Ref Range Status   Specimen Description SPU  Final   Special Requests SPU Reflexed from [id number]  Final   Gram Stain   Final    RARE WBC SEEN NO ORGANISMS SEEN Performed at [facility], [address] [place], [place] [address]    Culture PENDING  Incomplete   Report Status PENDING  Incomplete  MRSA Next Gen by PCR, Nasal     Status: None   Collection Time: 08/04/24 12:30 PM   Specimen: Nasal Mucosa; Nasal Swab  Result Value Ref Range Status   MRSA by PCR Next Gen NOT DETECTED NOT DETECTED Final    Comment: (NOTE) The GeneXpert MRSA Assay (FDA approved for NASAL specimens only), is one component of a comprehensive MRSA colonization surveillance program. It is not intended to diagnose MRSA infection nor to guide or monitor treatment for  MRSA infections. Test performance is not FDA approved in patients less than 50 years old. Performed at [facility], [address], [place], [place] [address]      Radiology Studies: No results found.    Nilda Fendt, MD, PhD [facility]  Between 7 am - 7 pm I am available, please contact me via Amion (for emergencies) or Securechat (non urgent messages)  Between 7 pm - 7 am I am not available, please contact night coverage MD/APP via Amion  "

## 2024-08-05 NOTE — Progress Notes (Addendum)
 Rapid response called for recurrent VT on this 88F with acute on chronic systolic HF EF 30% and COPD and recent peripheral vascular bypass surgery.  She complained of increasing SOB, then per RN report had 4 episodes of VT prompting ICD shocks.  Possible seizure activity.  Currently in moderate distress, O2 increased from 4L to high flow with some visible work of breathing, denies chest pain.  Sitting upright, mildly diaphoretic. Exam with elevated JVP and rhonchi on B sides auscultated anteriorly. Extremities cool but not icy cold.  Had just received amiodarone  bolus, and no further VT seen on the monitor.  HR in the 90-100s, BP initially 160s, now 120/70s, milrinone  discontinued.  She had been getting lasix  and also got K and Mag supplementation this afternoon for a K of 3.2 and Mg of 1.6 at 1pm this afternoon, repeat labs pending.  ECG shows ST elevation in V1-2 with Q waves, but has known chronically occluded LAD with collaterals from LCx  Recommend: - OK to remain off milrinone  to avoid further VT, will continue to monitor co-ox but she is not in overt cardiogenic shock, and prior milrinone  was at low dose primarily to facilitate diureses. - Continue amiodarone  with infusion following bolus  - Aggressively replete electrolytes, trying to keep K  4-4.5 and Mg 2-3. - Lots of volume on board, so will rebolus lasix  and start infusion at 10/hr - OK to discontinue digoxin  for now given electrolyte vacillations.  Will follow up on lactate and other labs - Continuing antibiotic coverage for possible pulmonary infection.  - Hospitalist team moving patient to the ICU for closer monitoring.  Appreciate the rapid and comprehensive care provided by Dr. Sundil Patient's change in status and treatment plan reviewed with Dr. Zenaida (Adv HF) who agrees.  [employer] [facility] [employer] on call

## 2024-08-05 NOTE — Progress Notes (Addendum)
 "    Advanced Heart Failure Rounding Note  Cardiologist: Lonni Cash, MD  [facility] Cardiologist: Establishing Dr. Rolan  Patient Profile   Pam [name] is a 74 y.o. female with  history of chronic HFrEF/mixed cardiomyopathy, CAD, PAD, chronic hyponatremia, COPD.    Admitted with acute respiratory failure, acute on chronic CHF and hyponatremia.  Subjective:    Co-ox 53%  CVP 5  Continues with significant dyspnea. She is short of breath sitting up in bed w/ pursed lip breathing.   Unable to come off supplemental O2.   On empiric abx per primary team for possible PNA.   Objective:   Weight Range: 55.9 kg Body mass index is 21.83 kg/m.   Vital Signs:   Temp:  [97.7 F (36.5 C)-98.1 F (36.7 C)] 98 F (36.7 C) (01/05 0858) Pulse Rate:  [81-97] 83 (01/05 0858) Resp:  [14-23] 19 (01/05 0858) BP: (123-152)/(52-91) 125/52 (01/05 0858) SpO2:  [90 %-98 %] 93 % (01/05 0858) Last BM Date : 08/04/24  Weight change: Filed Weights   08/02/24 0358 08/03/24 0507  Weight: 57.4 kg 55.9 kg    Intake/Output:   Intake/Output Summary (Last 24 hours) at 08/05/2024 0933 Last data filed at 08/05/2024 0300 Gross per 24 hour  Intake --  Output 1100 ml  Net -1100 ml     Physical Exam   General:  Ill  appearing.   Cor: Regular rate & rhythm. No murmurs. No JVD Lungs: tachypneic with pursed lip breathing. + expiratory wheezes. Extremities: no edema   Telemetry   SR 80s-90s  Labs   CBC Recent Labs    08/03/24 0406 08/04/24 0500  WBC 9.7 11.4*  HGB 8.2* 8.7*  HCT 24.0* 24.9*  MCV 91.3 90.9  PLT 395 443*   Basic Metabolic Panel Recent Labs    98/96/73 0406 08/04/24 0500  NA 128* 124*  K 3.7 3.7  CL 92* 88*  CO2 27 27  GLUCOSE 102* 144*  BUN 18 17  CREATININE 0.75 0.70  CALCIUM  8.5* 8.6*  MG 2.1 1.9   Liver Function Tests Recent Labs    08/03/24 0406 08/04/24 0500  AST 28 34  ALT 13 17  ALKPHOS 57 64  BILITOT 0.4 0.4  PROT 5.1* 5.5*  ALBUMIN  3.1*  3.4*   No results for input(s): LIPASE, AMYLASE in the last 72 hours. Cardiac Enzymes No results for input(s): CKTOTAL, CKMB, CKMBINDEX, TROPONINI in the last 72 hours.  BNP: BNP (last 3 results) Recent Labs    11/22/23 1129 03/26/24 1423 05/29/24 1206  BNP 472.0* 614.1* 841.1*    ProBNP (last 3 results) Recent Labs    07/24/24 1016 08/02/24 1551  PROBNP 4,537.0* 5,039.0*     D-Dimer No results for input(s): DDIMER in the last 72 hours. Hemoglobin A1C No results for input(s): HGBA1C in the last 72 hours. Fasting Lipid Panel No results for input(s): CHOL, HDL, LDLCALC, TRIG, CHOLHDL, LDLDIRECT in the last 72 hours. Medications:   Scheduled Medications:  aspirin  EC  81 mg Oral Q0600   atorvastatin   80 mg Oral Daily   Chlorhexidine  Gluconate Cloth  6 each Topical Daily   clopidogrel   75 mg Oral Q0600   collagenase    Topical Daily   digoxin   0.0625 mg Oral Daily   enoxaparin  (LOVENOX ) injection  40 mg Subcutaneous Q24H   feeding supplement  237 mL Oral TID WC   furosemide   80 mg Intravenous BID   levothyroxine   37.5 mcg Oral Q0600   polyethylene glycol  17 g Oral BID   ranolazine   1,000 mg Oral BID   senna-docusate  1 tablet Oral BID   sodium chloride  flush  10-40 mL Intracatheter Q12H   sodium chloride  flush  3 mL Intravenous Q12H   sodium chloride  HYPERTONIC  4 mL Nebulization BID   umeclidinium-vilanterol  1 puff Inhalation Daily   valACYclovir   1,000 mg Oral Daily    Infusions:  piperacillin -tazobactam (ZOSYN )  IV 3.375 g (08/05/24 0603)    PRN Medications: acetaminophen , calcium  carbonate, guaiFENesin -dextromethorphan, HYDROmorphone  (DILAUDID ) injection, ipratropium-albuterol , ondansetron  (ZOFRAN ) IV, oxyCODONE -acetaminophen , phenol, potassium chloride , sodium chloride  flush, sodium chloride  flush  Assessment/Plan   1. Acute hypoxemic respiratory failure: Patient is a prior smoker with h/o COPD but has not been on home oxygen,  but requiring supplemental O2 this admission - Query if respiratory failure is 2/2 combination of acute on chronic CHF and COPD exacerbation - Will continue to diurese, see below. If not improving over next couple of days may need RHC. - CXR with b/l infiltrates, pleural effusions likely not large enough to tap - Respiratory viral panel negative - Empiric abx per primary - Schedule nebs, start steroids (discussed with [facility])  2. Acute on chronic systolic CHF: Ischemic cardiomyopathy, has MDT ICD.  Echo this admission with EF 30-35%, WMAs, normal RV, IVC not dilated.   - Co-ox marginal. Start milrinone  0.125 mcg/kg/min to facilitate diuresis. - Continue IV lasix  80 BID - Continue digoxin  0.0625 - Start spiro 25 mg daily   3. CAD: NSTEMI 12/27, CTO LAD with collaterals and severe pLCx/OM1 stenosis on cath.  She had PCI to pLCx and OM1.  No chest pain.  - Continue ASA 81/Plavix  - Continue statin.  - Continue ranolazine   4. Hyponatremia: This appears chronic, Na in upper 120s -130 range generally, has been seen by endocrinology in the past.  Concern for possible SIADH +/- component d/t hypervolemia - Received tolvaptan  1/2 - Na 128 today, hypertonic saline per primary  5. PAD: S/p R sided axillobifemoral bypass using PTFE and R CFA endarterectomy this admission.  - ASA/Plavix  - Statin  6. Post-op anemia  7. Hypokalemia - K 2.5 - Supp aggressively - Add spiro 25   Length of Stay: 12  Foye Damron N, PA-C  08/05/2024, 9:33 AM  Advanced Heart Failure Team Pager [phone] (M-F; 7a - 5p)   Please visit [email or web address]: For overnight coverage please call cardiology fellow first. If fellow not available call Shock/ECMO MD on call.  For ECMO / Mechanical Support (Impella, IABP, LVAD) issues call Shock / ECMO MD on call.   "

## 2024-08-05 NOTE — Progress Notes (Signed)
 CRITICAL VALUE STICKER  CRITICAL VALUE: K 2.5  RECEIVER (on-site recipient of call):Pam [name]   DATE & TIME NOTIFIED: 08/05/2024 1100  MESSENGER (representative from lab): l. Kendall   MD NOTIFIED: Dr. KYM Fendt   TIME OF NOTIFICATION: 08/05/2024 1101  RESPONSE: New orders placed

## 2024-08-05 NOTE — Significant Event (Signed)
 Rapid Response Event Note  Reason for Call : Respiratory Distress   Initial Focused Assessment:  I was notified of pt in respiratory distress and a witnessed seizure like activity following a PCXR. Upon arrival, [name] is alert, answering questions appropriately and following commands. No reported LOC during episode. She is on HHFNC with labored breathing, diaphoretic and accessory muscle use, pink frothy productive cough. BBS coarse rhonchi and rales. Abd soft NT. Skin pale, warm and diaphoretic. Pt is having multiple (4+) runs of VT with discharge of her AICD. Amiodarone  bolus 150 mg given. Milrinone  infusion stopped per MD order. Dr. Lee, Dr. Sharie, Dr. Cesario and Dr. Vanessa all at bedside. Keppra  1500 mg IV given. Pt on Lasix  80 mg IV BID. An additional 80 mg IV given. 12 lead EKG showed ECG shows ST elevation in V1-2 with Q waves and Dr. Cesario aware. ABG done. Pt placed on BIPAP for increased WOB. Pt will be transferred to 2H06.   2100-97.40F, HR 115 ST, 165/88 (109), RR 35 on HHFNC 50L/100% Fio2.   -WOB improved since placed on BIPAP/lasix     Interventions:  -HHFNC 50L/100% Fio2 -EKG -Lasix  80 mg IV -Keppra  1500 mg IV -Amiodarone  bolus 150mg  and infusion -ABG (7.43/35/132/25) - drawn while on HHFNC -BIPAP 10/5 -Transfer to 7Y93   MD Notified: Dr. Lee, Dr. Cesario, Dr. Sharie and Dr. Vanessa  Call Time: 2041 Arrival Time: 2045 End Time: 2255  Griselda Alm ORN, RN

## 2024-08-05 NOTE — Progress Notes (Signed)
 Placed patient on heated high flow cannula and obtained arterial blood gas due to respiratory distress. Doctor at bedside.

## 2024-08-05 NOTE — Consult Note (Addendum)
 WOC Nurse Consult Note: Reason for Consult: Consult requested for sacrum and buttocks.  Pt was noted to have a Stage 2 pressure injury to the sacrum which was present on admission, which has declined to Unstageable, adhered tan slough.   Left and right buttocks have developed Stage 3 pressure injuries, red and moist.      Pressure Injury POA: No Dressing procedure/placement/frequency: Topical treatment orders provided for bedside nurses to perform as follows to promote healing:  1. Apply Santyl  to sacrum wound Q day, then cover with moist 2X2 and foam dressing.  Change foam dressing Q 3 days or PRN soiling 2. Cut piece of Aquacel Soila # [phone]) and apply to left and right buttocks wounds Q day, then cover with foam dressing.  Change foam dressing Q 3 days or PRN soiling. Please re-consult if further assistance is needed.    WOC team will reassess the site Q 7-10 days to determine if a change in the plan of care is indicated at that time.   Thank-you,  Stephane Fought MSN, RN, CWOCN, CWCN-AP, CNS Contact Mon-Fri 0700-1500: [phone]

## 2024-08-05 NOTE — Consult Note (Signed)
 "  NAME:  Pam [name], MRN:  [id number], DOB:  November 17, 1950, LOS: 12 ADMISSION DATE:  07/24/2024, CONSULTATION DATE:  1/5 REFERRING MD:  Cesario, CHIEF COMPLAINT:  resp failure    History of Present Illness:  74 year old female w/ h/o HFrEF, has ICD, HTN, PAD, HLD, came in initially on 12/24 w/ right sided leg pain and ischemia. Went to OR for right axillobifemoral bypass and fight common femoral endartectomy on 12/29.   Course has been c/b post op hypotension; placed on midodrine , volume overload w/ no sig response to IV diuresis. Hyponatremia (acute on chronic) baseline mid 120s, got tolvaptan  on 1/2.  ADV HF consulted on 1/2. PICC placed (CVP ~11) wt still up, diuretics adjusted. On 1/4 still reporting increased SOB. CXR raising concern for PNA so abx started. Na down to 124. 1/5 seen again by adv HF. Wheezing, steroids and BDs added. Milrinone  started. Lasix  up to 80mg /d, Spiro added. Na 128->125  On 1/5 evening hrs had 4 episodes of VT triggering ICD shocks. Administered additional amiodarone  bolus. Milrinone  stopped. K 3.2 , Mg 1.6. has chronic LAD. W/ mild ST elevation. During events there was change in mental status with concern for possible seizure however this was felt primarily to be syncopal event associated with the VT.  On 1/5 she was transferred to the intensive care for progressive respiratory failure and hemodynamic instability including recurrent VT   Pertinent  Medical History  Chronic HFrEF (baseline 35-40%) has ICD insitu, CAD, HTN, HLD, PAD    Significant Hospital Events: Including procedures, antibiotic start and stop dates in addition to other pertinent events   12/24 admitted w/ critical limb ischemia 12/29 right axillobifemoral bypass and fight common femoral endartectomy   1/2.  ADV HF consulted on 1/2. PICC placed (CVP ~11) wt still up, diuretics adjusted.  1/4 still reporting increased SOB. CXR raising concern for PNA so abx started. Na down to 124.  evening hrs had 4  episodes of VT triggering ICD shocks. Administered additional amiodarone  bolus.   Interim History / Subjective:  Feeling better after BIPAP   Objective    Blood pressure (!) 160/82, pulse 83, temperature (!) 97.1 F (36.2 C), temperature source Axillary, resp. rate (!) 30, height 5' 3 (1.6 m), weight 55.9 kg, SpO2 100%. CVP:  [10 mmHg-11 mmHg] 10 mmHg  FiO2 (%):  [36 %-100 %] 100 %   Intake/Output Summary (Last 24 hours) at 08/05/2024 2128 Last data filed at 08/05/2024 1736 Gross per 24 hour  Intake 250.66 ml  Output 1800 ml  Net -1549.34 ml   Filed Weights   08/02/24 0358 08/03/24 0507  Weight: 57.4 kg 55.9 kg    Examination: General: Acute on chronically ill-appearing 74 year old female now on noninvasive positive pressure ventilation.  Still exhibits some accessory use but markedly improved compared to earlier HENT: BiPAP mask in place Lungs: Diffuse rales with occasional wheezing and accessory use currently on noninvasive positive pressure ventilation.  Arterial blood gas reviewed.  Portable chest x-ray showing worsening diffuse pulmonary edema Cardiovascular: Regular rate and rhythm Abdomen: Soft not tender Extremities: Warm pulses palpable Neuro: Awake oriented and appropriate GU: External catheter in place  Resolved problem list   Assessment and Plan   Acute hypoxic respiratory failure secondary to diffuse pulmonary infiltrates consistent with Worsening pulmonary edema, complicated further by small bilateral pleural effusions, +/- pneumonia Wheezing likely cardiac wheeze in nature from heart failure.  Has had limited response to diuresis so far Plan Initiate noninvasive positive pressure ventilation  Starting Lasix  drip Will continue scheduled bronchodilators Continuous pulse oximetry N.p.o. Currently day #2 Zosyn , MRSA PCR was -24 hours again so no need to start vancomycin  Acute on chronic systolic heart failure.  Baseline EF 30 to 35%, current EF on 1 1 remains  the same at 130 to 35%.  There is left ventricular regional wall abnormalities RV size moderately enlarged with moderately enlarged pulmonary artery systolic pressures Plan Continue telemetry monitoring Transduce central venous pressure Aggressive diuresis Inotrope he placed on hold due to recurrent VT Ensure mean arterial pressure greater than 65 Repeat SCV O2 Following up lactic acid  History of known coronary artery disease with chronically occluded LAD.  Currently with ST elevation in V1 and V2, with elevated troponin. cardiology aware.  Current troponin is 210 which is up from 160 12 days ago.  Suspect there is a degree of demand ischemia Plan Continue telemetry monitoring Trending cardiac enzymes.  VT.  Status post ICD shock x 4 Plan Continue amiodarone  infusion Ensure potassium greater than 4 and magnesium  greater than 2 Telemetry monitoring Milrinone  stopped per cardiology given concern about possible precipitating VT  Lactic acidosis.  Her lower extremities look well perfused, I think this was due to work of breathing.  MAP greater than 65 currently Plan Continue to trend lactate  Brief change in mental status initiatively thought  seizure but seen by neurology who felt probable syncopal vs hypoxia. No focal defs. Now awake and alert.  Plan Serial neuro checks  Acute on chronic hyponatremia.  Suspect secondary to heart failure. Last sodium 125. Plan Continuing diuresis Serial chemistries Strict intake output  Fluid and electrolyte imbalance: Hypokalemia, hypochloremia, Hypomagnesemia. Plan Aggressive replacement given Lasix  drip  Hypothyroidism Plan Continuing Synthroid   Critical limb ischemia status post right sided axillobifemoral bypass with right common femoral endarterectomy on 12/29 Plan Continuing Plavix  and aspirin  as well as statin  Anemia.  Without evidence of bleeding. Plan Trend CBC  Hyperglycemia. Capillary blood glucose greater than 200  currently.  Suspect steroid related Plan Start sliding scale insulin   Labs   CBC: Recent Labs  Lab 08/02/24 0307 08/03/24 0406 08/04/24 0500 08/05/24 1015 08/05/24 1214  WBC 10.1 9.7 11.4* 8.7 12.4*  HGB 8.5* 8.2* 8.7* 5.9* 9.0*  HCT 24.3* 24.0* 24.9* 17.1* 25.9*  MCV 91.7 91.3 90.9 91.9 90.6  PLT 385 395 443* 285 486*    Basic Metabolic Panel: Recent Labs  Lab 08/02/24 0307 08/02/24 2036 08/03/24 0406 08/04/24 0500 08/05/24 1015 08/05/24 1308  NA 121* 126* 128* 124* 128* 125*  K 3.9 3.3* 3.7 3.7 2.5* 3.2*  CL 88* 91* 92* 88* 94* 90*  CO2 26 27 27 27 24 25   GLUCOSE 99 137* 102* 144* 132* 130*  BUN 21 20 18 17 16 18   CREATININE 0.88 0.75 0.75 0.70 0.63 0.71  CALCIUM  7.9* 8.3* 8.5* 8.6* 7.0* 7.7*  MG 1.8  --  2.1 1.9 1.5* 1.6*   GFR: Estimated Creatinine Clearance: 51.8 mL/min (by C-G formula based on SCr of 0.71 mg/dL). Recent Labs  Lab 08/02/24 1247 08/03/24 0406 08/04/24 0500 08/05/24 1015 08/05/24 1214  PROCALCITON <0.10  --   --   --   --   WBC  --  9.7 11.4* 8.7 12.4*    Liver Function Tests: Recent Labs  Lab 08/01/24 0817 08/03/24 0406 08/04/24 0500  AST 29 28 34  ALT 13 13 17   ALKPHOS 59 57 64  BILITOT 0.4 0.4 0.4  PROT 5.4* 5.1* 5.5*  ALBUMIN   3.3* 3.1* 3.4*   No results for input(s): LIPASE, AMYLASE in the last 168 hours. No results for input(s): AMMONIA in the last 168 hours.  ABG    Component Value Date/Time   PHART 7.313 (L) 07/29/2024 1145   PCO2ART 52.2 (H) 07/29/2024 1145   PO2ART 111 (H) 07/29/2024 1145   HCO3 26.8 07/29/2024 1145   TCO2 28 07/29/2024 1145   ACIDBASEDEF 1.0 08/11/2022 1218   ACIDBASEDEF 2.0 08/11/2022 1218   O2SAT 52.8 08/05/2024 0330     Coagulation Profile: No results for input(s): INR, PROTIME in the last 168 hours.  Cardiac Enzymes: No results for input(s): CKTOTAL, CKMB, CKMBINDEX, TROPONINI in the last 168 hours.  HbA1C: Hgb A1c MFr Bld  Date/Time Value Ref Range Status   05/30/2024 08:51 AM 5.4 <5.7 % Final    Comment:    For the purpose of screening for the presence of diabetes: . <5.7%       Consistent with the absence of diabetes 5.7-6.4%    Consistent with increased risk for diabetes             (prediabetes) > or =6.5%  Consistent with diabetes . This assay result is consistent with a decreased risk of diabetes. . Currently, no consensus exists regarding use of hemoglobin A1c for diagnosis of diabetes in children. . According to American Diabetes Association (ADA) guidelines, hemoglobin A1c <7.0% represents optimal control in non-pregnant diabetic patients. Different metrics may apply to specific patient populations.  Standards of Medical Care in Diabetes(ADA). .   07/09/2016 04:28 AM 5.3 4.8 - 5.6 % Final    Comment:    (NOTE)         Pre-diabetes: 5.7 - 6.4         Diabetes: >6.4         Glycemic control for adults with diabetes: <7.0     CBG: No results for input(s): GLUCAP in the last 168 hours.  Review of Systems:   Not able currently   Past Medical History:  She,  has a past medical history of Abnormal EKG, AICD (automatic cardioverter/defibrillator) present, Alcoholic intoxication without complication, Allergy, Cardiomyopathy, ischemic, Cataract, Chest pain (08/29/2016), Chest pain (08/29/2016), CHF (congestive heart failure) (HCC), Chronic systolic heart failure (HCC) (98/70/7981), Coronary artery disease, Facial laceration, Fall (07/07/2016), Hyperlipidemia, Hypertension (03/03/2017), Hyponatremia, Hypothyroidism (07/08/2016), NSTEMI (non-ST elevated myocardial infarction) (HCC), Syncope, and Thyroid  disease.   Surgical History:   Past Surgical History:  Procedure Laterality Date   AXILLARY-FEMORAL BYPASS GRAFT Bilateral 07/29/2024   Procedure: CREATION, BYPASS, ARTERIAL, AXILLARY TO BILATERAL FEMORAL, USING GRAFT;  Surgeon: Lanis Fonda BRAVO, MD;  Location: [facility] OR;  Service: Vascular;  Laterality: Bilateral;   CARDIAC  CATHETERIZATION N/A 07/08/2016   Procedure: Left Heart Cath and Coronary Angiography;  Surgeon: Lonni Hanson, MD;  Location[facility] CV LAB;  Service: Cardiovascular;  Laterality: N/A;   CARDIAC CATHETERIZATION N/A 08/29/2016   Procedure: Coronary Atherectomy;  Surgeon: Lonni Hanson, MD;  Location: [facility] INVASIVE CV LAB;  Service: Cardiovascular;  Laterality: N/A;   CARDIAC CATHETERIZATION N/A 08/29/2016   Procedure: Coronary Stent Intervention;  Surgeon: Lonni Hanson, MD;  Location: [facility] INVASIVE CV LAB;  Service: Cardiovascular;  Laterality: N/A;   CATARACT EXTRACTION, BILATERAL     COLONOSCOPY WITH PROPOFOL  N/A 11/18/2021   Procedure: COLONOSCOPY WITH PROPOFOL ;  Surgeon: Eda Iha, MD;  Location: [facility] ENDOSCOPY;  Service: Gastroenterology;  Laterality: N/A;   CORONARY ANGIOPLASTY     coronary stents      ENDARTERECTOMY FEMORAL  Bilateral 07/29/2024   Procedure: ENDARTERECTOMY, FEMORAL;  Surgeon: Lanis Fonda BRAVO, MD;  Location: [facility] OR;  Service: Vascular;  Laterality: Bilateral;   HAND SURGERY Right    Dr Alyse Gaba   ICD IMPLANT N/A 08/28/2019   Procedure: ICD IMPLANT;  Surgeon: Inocencio Soyla Lunger, MD;  Location: [facility] INVASIVE CV LAB;  Service: Cardiovascular;  Laterality: N/A;   LEFT HEART CATH AND CORONARY ANGIOGRAPHY N/A 04/29/2022   Procedure: LEFT HEART CATH AND CORONARY ANGIOGRAPHY;  Surgeon: Anner Alm ORN, MD;  Location: [facility] INVASIVE CV LAB;  Service: Cardiovascular;  Laterality: N/A;   LOWER EXTREMITY ANGIOGRAPHY N/A 06/19/2024   Procedure: Lower Extremity Angiography;  Surgeon: Darron Deatrice LABOR, MD;  Location: [facility] INVASIVE CV LAB;  Service: Cardiovascular;  Laterality: N/A;   PARATHYROIDECTOMY Right 02/02/2021   Procedure: RIGHT SUPERIOR PARATHYROIDECTOMY;  Surgeon: Eletha Boas, MD;  Location: [facility] ORS;  Service: General;  Laterality: Right;   POLYPECTOMY  11/18/2021   Procedure: POLYPECTOMY;  Surgeon: Eda Iha, MD;  Location: [facility] ENDOSCOPY;  Service:  Gastroenterology;;   RIGHT HEART CATH N/A 08/11/2022   Procedure: RIGHT HEART CATH;  Surgeon: Gardenia Led, DO;  Location: [facility] INVASIVE CV LAB;  Service: Cardiovascular;  Laterality: N/A;   VAGINAL HYSTERECTOMY       Social History:   reports that she quit smoking about 8 years ago. Her smoking use included cigarettes. She started smoking about 58 years ago. She has a 50 pack-year smoking history. She has never used smokeless tobacco. She reports that she does not currently use alcohol. She reports that she does not use drugs.   Family History:  Her family history includes Heart disease in her mother and sister; Kidney failure in her mother. There is no history of Colon cancer, Esophageal cancer, Rectal cancer, or Stomach cancer.   Allergies Allergies[1]   Home Medications  Prior to Admission medications  Medication Sig Start Date End Date Taking? Authorizing Provider  acetaminophen  (TYLENOL ) 500 MG tablet Take 1,000 mg by mouth every 6 (six) hours as needed for moderate pain.   Yes [provider]  albuterol  (VENTOLIN  HFA) 108 (90 Base) MCG/ACT inhaler Inhale 2 puffs into the lungs every 6 (six) hours as needed for wheezing or shortness of breath. 12/28/23  Yes Hope Almarie ORN, NP  aspirin  81 MG chewable tablet Chew 81 mg by mouth daily.   Yes [provider]  atorvastatin  (LIPITOR ) 80 MG tablet TAKE 1 TABLET(80 MG) BY MOUTH DAILY 11/27/18  Yes Verlin Lonni BIRCH, MD  bisoprolol  (ZEBETA ) 5 MG tablet Take 0.5 tablets (2.5 mg total) by mouth daily. 06/26/24  Yes Milford, Harlene HERO, FNP  CALCIUM  PO Take 1 tablet by mouth daily.   Yes [provider]  Cholecalciferol (VITAMIN D3) 50 MCG (2000 UT) TABS Take 2,000 Units by mouth daily.   Yes [provider]  clopidogrel  (PLAVIX ) 75 MG tablet TAKE 1 TABLET(75 MG) BY MOUTH DAILY WITH BREAKFAST 11/27/23  Yes Sabharwal, Aditya, DO  digoxin  (LANOXIN ) 0.125 MG tablet Take 0.5 tablets (0.0625 mg total) by  mouth daily. 11/22/23  Yes Milford, Harlene HERO, FNP  furosemide  (LASIX ) 20 MG tablet TAKE 1 TABLET BY MOUTH AS NEEDED 02/27/24  Yes Sabharwal, Aditya, DO  levothyroxine  (SYNTHROID ) 25 MCG tablet Take 37.5 mcg by mouth daily before breakfast. 07/10/23  Yes Thapa, Sudan, MD  nitroGLYCERIN  (NITROSTAT ) 0.4 MG SL tablet Place 1 tablet (0.4 mg total) under the tongue every 5 (five) minutes x 3 doses as needed for chest pain. 06/14/24  Yes Verlin Lonni BIRCH, MD  nystatin cream (MYCOSTATIN) Apply 1 Application topically 2 (two) times daily as needed (rash). 05/09/22  Yes [provider]  ranolazine  (RANEXA ) 1000 MG SR tablet TAKE 1 TABLET(1000 MG) BY MOUTH TWICE DAILY 02/09/24  Yes Milford, [place], FNP  sacubitril -valsartan  (ENTRESTO ) 49-51 MG Take 1 tablet by mouth 2 (two) times daily. 05/01/24  Yes Clegg, Amy D, NP  spironolactone  (ALDACTONE ) 25 MG tablet TAKE 1 TABLET(25 MG) BY MOUTH DAILY 07/03/23  Yes Sabharwal, Aditya, DO  umeclidinium-vilanterol (ANORO ELLIPTA ) 62.5-25 MCG/ACT AEPB Inhale 1 puff into the lungs daily. 12/28/23  Yes Hope Almarie ORN, NP  valACYclovir  (VALTREX ) 1000 MG tablet Take 1,000 mg by mouth daily.   Yes [provider]     Critical care time:  I personally  spent 45 minutes  on this patient which included: review of medical records, nursing notes, progress notes, evaluation, interpretation of lab data and diagnostic studies, taking independent history, performing exam, documenting plan, ordering diagnostics and interventions for the following critical care issues: Acute respiratory failure, Acute toxic and or metabolic encephalopathy, Severe metabolic derangements with the following interventions which included: titration of ventilatory support               [1]  Allergies Allergen Reactions   Lisinopril  Cough    Dry cough   "

## 2024-08-05 NOTE — Consult Note (Signed)
 NEUROLOGY CONSULT NOTE   Date of service: August 05, 2024 Patient Name: Contessa Preuss MRN:  [id number] DOB:  [birth year] Chief Complaint: ?seizure Requesting Provider: Trixie Nilda HERO, MD  History of Present Illness  Meeyah Ovitt is a 74 y.o. female with hx of CHF, PAD, recent R leg bypass 2/2 limb ischemia who had recurrent Vtachs and hypoxia tonight along with respiratory distress. In the setting of this, she was noted to have 2 brief episodes of stiffening. No significant post ictal period and Neurology consulted for concern for seizures.  There is no prior hx of seizures. Patient appears distressed and short of breath on Bipap and unable to obtain meaningful history. When asked if she has had seizure in the past, she shakes her head to say no.   ROS  Unable to ascertain due to on bipap, SOB.  Past History   Past Medical History:  Diagnosis Date   Abnormal EKG    AICD (automatic cardioverter/defibrillator) present    [employer]   Alcoholic intoxication without complication    Allergy    Cardiomyopathy, ischemic    Cataract    Chest pain 08/29/2016   Chest pain 08/29/2016   CHF (congestive heart failure) (HCC)    Chronic systolic heart failure (HCC) 08/29/2016   Coronary artery disease    Facial laceration    Fall 07/07/2016   Hyperlipidemia    Hypertension 03/03/2017   Hyponatremia    Hypothyroidism 07/08/2016   NSTEMI (non-ST elevated myocardial infarction) (HCC)    Syncope    Thyroid  disease     Past Surgical History:  Procedure Laterality Date   AXILLARY-FEMORAL BYPASS GRAFT Bilateral 07/29/2024   Procedure: CREATION, BYPASS, ARTERIAL, AXILLARY TO BILATERAL FEMORAL, USING GRAFT;  Surgeon: Lanis Fonda BRAVO, MD;  Location: [facility] OR;  Service: Vascular;  Laterality: Bilateral;   CARDIAC CATHETERIZATION N/A 07/08/2016   Procedure: Left Heart Cath and Coronary Angiography;  Surgeon: Lonni Hanson, MD;  Location: [facility] INVASIVE CV LAB;  Service: Cardiovascular;  Laterality:  N/A;   CARDIAC CATHETERIZATION N/A 08/29/2016   Procedure: Coronary Atherectomy;  Surgeon: Lonni Hanson, MD;  Location: [facility] INVASIVE CV LAB;  Service: Cardiovascular;  Laterality: N/A;   CARDIAC CATHETERIZATION N/A 08/29/2016   Procedure: Coronary Stent Intervention;  Surgeon: Lonni Hanson, MD;  Location: [facility] INVASIVE CV LAB;  Service: Cardiovascular;  Laterality: N/A;   CATARACT EXTRACTION, BILATERAL     COLONOSCOPY WITH PROPOFOL  N/A 11/18/2021   Procedure: COLONOSCOPY WITH PROPOFOL ;  Surgeon: Eda Iha, MD;  Location: [facility] ENDOSCOPY;  Service: Gastroenterology;  Laterality: N/A;   CORONARY ANGIOPLASTY     coronary stents      ENDARTERECTOMY FEMORAL Bilateral 07/29/2024   Procedure: ENDARTERECTOMY, FEMORAL;  Surgeon: Lanis Fonda BRAVO, MD;  Location: [facility] OR;  Service: Vascular;  Laterality: Bilateral;   HAND SURGERY Right    Dr Alyse Gaba   ICD IMPLANT N/A 08/28/2019   Procedure: ICD IMPLANT;  Surgeon: Inocencio Soyla Lunger, MD;  Location: [facility] INVASIVE CV LAB;  Service: Cardiovascular;  Laterality: N/A;   LEFT HEART CATH AND CORONARY ANGIOGRAPHY N/A 04/29/2022   Procedure: LEFT HEART CATH AND CORONARY ANGIOGRAPHY;  Surgeon: Anner Alm ORN, MD;  Location: [facility] INVASIVE CV LAB;  Service: Cardiovascular;  Laterality: N/A;   LOWER EXTREMITY ANGIOGRAPHY N/A 06/19/2024   Procedure: Lower Extremity Angiography;  Surgeon: Darron Deatrice LABOR, MD;  Location: [facility] INVASIVE CV LAB;  Service: Cardiovascular;  Laterality: N/A;   PARATHYROIDECTOMY Right 02/02/2021   Procedure: RIGHT SUPERIOR PARATHYROIDECTOMY;  Surgeon: Eletha Boas, MD;  Location: [facility] ORS;  Service: General;  Laterality: Right;   POLYPECTOMY  11/18/2021   Procedure: POLYPECTOMY;  Surgeon: Eda Iha, MD;  Location: [facility] ENDOSCOPY;  Service: Gastroenterology;;   RIGHT HEART CATH N/A 08/11/2022   Procedure: RIGHT HEART CATH;  Surgeon: Gardenia Led, DO;  Location: [facility] INVASIVE CV LAB;  Service: Cardiovascular;   Laterality: N/A;   VAGINAL HYSTERECTOMY      Family History: Family History  Problem Relation Age of Onset   Kidney failure Mother    Heart disease Mother        CHF   Heart disease Sister    Colon cancer Neg Hx    Esophageal cancer Neg Hx    Rectal cancer Neg Hx    Stomach cancer Neg Hx     Social History  reports that she quit smoking about 8 years ago. Her smoking use included cigarettes. She started smoking about 58 years ago. She has a 50 pack-year smoking history. She has never used smokeless tobacco. She reports that she does not currently use alcohol. She reports that she does not use drugs.  Allergies[1]  Medications  Current Medications[2]  Vitals   Vitals:   08/05/24 1524 08/05/24 1940 08/05/24 2026 08/05/24 2053  BP: (!) 117/51 (!) 160/82    Pulse: 88 (!) 106 (!) 129 83  Resp: 18 (!) 28 (!) 34 (!) 30  Temp: 97.9 F (36.6 C) (!) 97.1 F (36.2 C)    TempSrc: Oral Axillary    SpO2: 90% 95% 92% 100%  Weight:      Height:        Body mass index is 21.83 kg/m.   Physical Exam   General: Laying comfortably in bed; in no acute distress.  HENT: Normal oropharynx and mucosa. Normal external appearance of ears and nose.  Neck: Supple, no pain or tenderness  CV: No JVD. No peripheral edema.  Pulmonary: Symmetric Chest rise. Tachypneic. Abdomen: Soft to touch, non-tender.  Ext: No cyanosis, edema, or deformity  Skin: No rash. Normal palpation of skin.   Musculoskeletal: Normal digits and nails by inspection. No clubbing.   Neurologic Examination  Mental status/Cognition: Alert, oriented to self. Speech/language:comprehension intact, difficult to evaluate for dysarthria with Bipap. Cranial nerves:   CN II Pupils equal and reactive to light, no VF deficits    CN III,IV,VI EOM intact, no gaze preference or deviation, no nystagmus    CN V normal sensation in V1, V2, and V3 segments bilaterally    CN VII No obvious facial asymmetry noted, difficult to evaluate  with bipap   CN VIII normal hearing to speech    CN IX & X Protecting her airway right now   CN XI 5/5 head turn and 5/5 shoulder shrug bilaterally   CN XII midline tongue protrusion   Motor:  Muscle bulk: normal, tone normal Mvmt Root Nerve  Muscle Right Left Comments  SA C5/6 Ax Deltoid     EF C5/6 Mc Biceps 5 5   EE C6/7/8 Rad Triceps 5 5   WF C6/7 Med FCR     WE C7/8 PIN ECU     F Ab C8/T1 U ADM/FDI 5 5   HF L1/2/3 Fem Illopsoas 5 5   KE L2/3/4 Fem Quad     DF L4/5 D Peron Tib Ant 5 5   PF S1/2 Tibial Grc/Sol 5 5    Sensation:  Light touch Intact throughout   Pin prick    Temperature    Vibration  Proprioception    Coordination/Complex Motor:  - Finger to Nose intact BL - Heel to shin unable to get her to do given acuity of the situation - Rapid alternating movement are normal BL - Gait: deferred for pt safety.  Labs/Imaging/Neurodiagnostic studies   CBC:  Recent Labs  Lab 08-15-2024 1015 Aug 15, 2024 1214  WBC 8.7 12.4*  HGB 5.9* 9.0*  HCT 17.1* 25.9*  MCV 91.9 90.6  PLT 285 486*   Basic Metabolic Panel:  Lab Results  Component Value Date   NA 125 (L) 08/15/24   K 3.2 (L) 15-Aug-2024   CO2 25 2024/08/15   GLUCOSE 130 (H) August 15, 2024   BUN 18 Aug 15, 2024   CREATININE 0.71 2024/08/15   CALCIUM  7.7 (L) 08/15/2024   GFRNONAA >60 August 15, 2024   GFRAA 78 08/19/2019   Lipid Panel:  Lab Results  Component Value Date   LDLCALC 35 07/30/2024   HgbA1c:  Lab Results  Component Value Date   HGBA1C 5.4 05/30/2024   Urine Drug Screen:     Component Value Date/Time   LABOPIA NONE DETECTED 07/08/2016 0610   COCAINSCRNUR NONE DETECTED 07/08/2016 0610   LABBENZ NONE DETECTED 07/08/2016 0610   AMPHETMU NONE DETECTED 07/08/2016 0610   THCU NONE DETECTED 07/08/2016 0610   LABBARB NONE DETECTED 07/08/2016 0610    Alcohol Level     Component Value Date/Time   ETH 221 (H) 07/07/2016 2036   INR  Lab Results  Component Value Date   INR 1.1 07/24/2024   APTT   Lab Results  Component Value Date   APTT 29 07/23/2024   AED levels: No results found for: PHENYTOIN, ZONISAMIDE, LAMOTRIGINE, LEVETIRACETA   ASSESSMENT   Bessie Dandy is a 74 y.o. female with hx of CHF, PAD, recent R leg bypass 2/2 limb ischemia who had recurrent Vtachs and hypoxia tonight along with respiratory distress. In the setting of this, she was noted to have 2 brief episodes of stiffening. No significant post ictal period and Neurology consulted for concern for seizures.  Episode seems more concerning for convulsive syncope rather than seizures. There is no documented hx of seizures in her medical records that I could find. She is not on any medications that can lower seizure threshold. Her neuro exam is intact and she is awake despite SOB and being on Bipap.  My overall suspicion for seizures is low. She was loaded with Keppra  1500mg  prior to my arrival. Hold off on further AEDs. Will get routine CT Head and rEEG and if negative, no further workup.  RECOMMENDATIONS  -  Hold off on further AEDs. Will get routine CT Head and rEEG and if negative, no further workup. - Neurology will follow up on the studies above but we do not plan to see Ms. Cattaleya again inpatient unless requested by primary team or patient/family. ______________________________________________________________________  Plan discussed with Dr. Sundil and Dr. Sharie at the bedside.  Signed, Dvid Pendry, MD [facility]     [1]  Allergies Allergen Reactions   Lisinopril  Cough    Dry cough  [2]  Current Facility-Administered Medications:    acetaminophen  (TYLENOL ) tablet 650 mg, 650 mg, Oral, Q4H PRN, Rhyne, Samantha J, PA-C, 650 mg at 08/15/2024 9388   acetylcysteine  (MUCOMYST ) 20 % nebulizer / oral solution 4 mL, 4 mL, Nebulization, TID, Sundil, Subrina, MD   amiodarone  (NEXTERONE ) 1.8 mg/mL load via infusion 150 mg, 150 mg, Intravenous, STAT **FOLLOWED BY** amiodarone  (NEXTERONE   PREMIX) 360-4.14 MG/200ML-% (1.8 mg/mL) IV infusion, 30 mg/hr, Intravenous, Continuous **  FOLLOWED BY** [START ON 08/06/2024] amiodarone  (NEXTERONE  PREMIX) 360-4.14 MG/200ML-% (1.8 mg/mL) IV infusion, 30 mg/hr, Intravenous, Continuous, Sundil, Subrina, MD   amiodarone  (NEXTERONE  PREMIX) 360-4.14 MG/200ML-% (1.8 mg/mL) IV infusion, , , ,    aspirin  EC tablet 81 mg, 81 mg, Oral, Q0600, Rhyne, Samantha J, PA-C, 81 mg at 08/05/24 0604   atorvastatin  (LIPITOR ) tablet 80 mg, 80 mg, Oral, Daily, Rhyne, Samantha J, PA-C, 80 mg at 08/05/24 1007   calcium  carbonate (TUMS - dosed in mg elemental calcium ) chewable tablet 400 mg of elemental calcium , 400 mg of elemental calcium , Oral, Once PRN, Rhyne, Samantha J, PA-C   Chlorhexidine  Gluconate Cloth 2 % PADS 6 each, 6 each, Topical, Daily, Gherghe, Costin M, MD, 6 each at 08/05/24 1000   collagenase  (SANTYL ) ointment, , Topical, Daily, Gherghe, Costin M, MD, Given at 08/05/24 1200   digoxin  (LANOXIN ) tablet 0.0625 mg, 0.0625 mg, Oral, Daily, Rhyne, Samantha J, PA-C, 0.0625 mg at 08/05/24 1007   feeding supplement (BOOST / RESOURCE BREEZE) liquid 1 Container, 237 mL, Oral, TID WC, Robins, Joshua E, MD, 1 Container at 08/05/24 1200   furosemide  (LASIX ) injection 20 mg, 20 mg, Intravenous, STAT, Sundil, Subrina, MD   furosemide  (LASIX ) injection 60 mg, 60 mg, Intravenous, STAT, Sundil, Subrina, MD   furosemide  (LASIX ) injection 80 mg, 80 mg, Intravenous, BID, Segal, Jared E, DO, 80 mg at 08/05/24 1728   guaiFENesin -dextromethorphan (ROBITUSSIN DM) 100-10 MG/5ML syrup 5 mL, 5 mL, Oral, Q4H PRN, Trixie, Costin M, MD   HYDROmorphone  (DILAUDID ) injection 0.5 mg, 0.5 mg, Intravenous, Q3H PRN, Rhyne, Samantha J, PA-C   ipratropium-albuterol  (DUONEB) 0.5-2.5 (3) MG/3ML nebulizer solution 3 mL, 3 mL, Nebulization, Q4H, Colletta Manuelita Garre, PA-C, 3 mL at 08/05/24 8062   levETIRAcetam  (KEPPRA ) undiluted injection 1,500 mg, 1,500 mg, Intravenous, STAT, Sundil, Subrina, MD    levothyroxine  (SYNTHROID ) tablet 37.5 mcg, 37.5 mcg, Oral, Q0600, Rhyne, Samantha J, PA-C, 37.5 mcg at 08/05/24 0604   methylPREDNISolone  sodium succinate (SOLU-MEDROL ) 40 mg/mL injection 40 mg, 40 mg, Intravenous, Q12H, Sundil, Subrina, MD   milrinone  (PRIMACOR ) 20 MG/100 ML (0.2 mg/mL) infusion, 0.125 mcg/kg/min, Intravenous, Continuous, Colletta Manuelita Garre, PA-C, Last Rate: 2.1 mL/hr at 08/05/24 1212, 0.125 mcg/kg/min at 08/05/24 1212   ondansetron  (ZOFRAN ) injection 4 mg, 4 mg, Intravenous, Q6H PRN, Rhyne, Samantha J, PA-C   oxyCODONE -acetaminophen  (PERCOCET/ROXICET) 5-325 MG per tablet 1 tablet, 1 tablet, Oral, Q6H PRN, Rhyne, Samantha J, PA-C, 1 tablet at 08/05/24 1220   phenol (CHLORASEPTIC) mouth spray 1 spray, 1 spray, Mouth/Throat, PRN, Rhyne, Samantha J, PA-C   piperacillin -tazobactam (ZOSYN ) IVPB 3.375 g, 3.375 g, Intravenous, Q8H, Hindman, Katherine G, RPH, Last Rate: 12.5 mL/hr at 08/05/24 1306, 3.375 g at 08/05/24 1306   polyethylene glycol (MIRALAX  / GLYCOLAX ) packet 17 g, 17 g, Oral, Daily PRN, Gherghe, Costin M, MD   potassium chloride  SA (KLOR-CON  M) CR tablet 40-60 mEq, 40-60 mEq, Oral, Daily PRN, Rhyne, Samantha J, PA-C, 60 mEq at 07/29/24 1705   ranolazine  (RANEXA ) 12 hr tablet 1,000 mg, 1,000 mg, Oral, BID, Rhyne, Samantha J, PA-C, 1,000 mg at 08/05/24 1007   senna-docusate (Senokot-S) tablet 1 tablet, 1 tablet, Oral, QHS PRN, Gherghe, Costin M, MD   sodium chloride  flush (NS) 0.9 % injection 10-40 mL, 10-40 mL, Intracatheter, Q12H, Gherghe, Costin M, MD, 20 mL at 08/05/24 1008   sodium chloride  flush (NS) 0.9 % injection 10-40 mL, 10-40 mL, Intracatheter, PRN, Gherghe, Costin M, MD   sodium chloride  flush (NS) 0.9 % injection 3 mL,  3 mL, Intravenous, Q12H, Rhyne, Samantha J, PA-C, 3 mL at 08/05/24 0830   sodium chloride  flush (NS) 0.9 % injection 3 mL, 3 mL, Intravenous, PRN, Rhyne, Samantha J, PA-C   sodium chloride  HYPERTONIC 3 % nebulizer solution 4 mL, 4 mL, Nebulization,  BID, Gherghe, Costin M, MD, 4 mL at 08/05/24 1942   umeclidinium-vilanterol (ANORO ELLIPTA ) 62.5-25 MCG/ACT 1 puff, 1 puff, Inhalation, Daily, Rhyne, Samantha J, PA-C, 1 puff at 08/05/24 1127   valACYclovir  (VALTREX ) tablet 1,000 mg, 1,000 mg, Oral, Daily, Rhyne, Samantha J, PA-C, 1,000 mg at 08/05/24 1007

## 2024-08-05 NOTE — Progress Notes (Addendum)
 " Progress Note    08/05/2024 9:09 AM 7 Days Post-Op  Subjective:  SOB is only complaint.  Feeling a little better since breathing treatment this morning.   Vitals:   08/05/24 0600 08/05/24 0858  BP:  (!) 125/52  Pulse: 97 83  Resp: 18 19  Temp:  98 F (36.7 C)  SpO2: 98% 93%   Physical Exam: Lungs:  non labored Incisions:  incisional vac removed; R groin healing well with viable skin edges; L groin well appearing; R chest incision c/d/i Extremities:  1+ R radial pulse; brisk DP signals by doppler; R heel and GT wounds dry, no surrounding erythema Neurologic: a&O  CBC    Component Value Date/Time   WBC 11.4 (H) 08/04/2024 0500   RBC 2.74 (L) 08/04/2024 0500   HGB 8.7 (L) 08/04/2024 0500   HGB 13.6 06/11/2024 1039   HCT 24.9 (L) 08/04/2024 0500   HCT 39.0 06/11/2024 1039   PLT 443 (H) 08/04/2024 0500   PLT 335 06/11/2024 1039   MCV 90.9 08/04/2024 0500   MCV 102 (H) 06/11/2024 1039   MCH 31.8 08/04/2024 0500   MCHC 34.9 08/04/2024 0500   RDW 15.7 (H) 08/04/2024 0500   RDW 13.1 06/11/2024 1039   LYMPHSABS 1.4 07/25/2024 0253   LYMPHSABS 2.9 08/25/2016 1648   MONOABS 1.2 (H) 07/25/2024 0253   EOSABS 0.0 07/25/2024 0253   EOSABS 0.2 08/25/2016 1648   BASOSABS 0.0 07/25/2024 0253   BASOSABS 0.0 08/25/2016 1648    BMET    Component Value Date/Time   NA 124 (L) 08/04/2024 0500   NA 132 (L) 06/11/2024 1039   K 3.7 08/04/2024 0500   CL 88 (L) 08/04/2024 0500   CO2 27 08/04/2024 0500   GLUCOSE 144 (H) 08/04/2024 0500   BUN 17 08/04/2024 0500   BUN 15 06/11/2024 1039   CREATININE 0.70 08/04/2024 0500   CREATININE 1.13 (H) 05/30/2024 0851   CALCIUM  8.6 (L) 08/04/2024 0500   GFRNONAA >60 08/04/2024 0500   GFRAA 78 08/19/2019 0830    INR    Component Value Date/Time   INR 1.1 07/24/2024 1016     Intake/Output Summary (Last 24 hours) at 08/05/2024 0909 Last data filed at 08/05/2024 0300 Gross per 24 hour  Intake --  Output 1100 ml  Net -1100 ml      Assessment/Plan:  74 y.o. female is s/p R axillary bifemoral bypass  7 Days Post-Op   BLE well perfused by doppler exam.  R groin incisional vac was removed and seems to be healing well.  Continue dry gauze changes to both groins daily; no tape.  From a surgical standpoint she continues to be doing well.  OOB with mobility again today.  Home when improved medically.   Donnice Sender, PA-C [facility] [phone] 08/05/2024 9:09 AM  VASCULAR STAFF ADDENDUM: I have independently interviewed and examined the patient. I agree with the above.  [name] continues to struggle from a fluid overload standpoint, now with pneumonia. Would appreciate holding on steroids as this will significantly impact wound healing.  If her wounds do not heal and infection ensues, which she is already at high risk for due to malnutrition and fluid overload, the graft would need to be excised, which would likely result in limb loss. Plavix  held due to recent drop in Hct.  She remains tenuous from a postoperative cardiopulmonary standpoint. Will continue to follow, and appreciate the excellent care from Dr. Trixie Fonda FORBES Lanis MD  [facility] Phone Number: [phone] 08/05/2024 3:32 PM    "

## 2024-08-05 NOTE — Progress Notes (Signed)
 CRITICAL VALUE STICKER  CRITICAL VALUE: hgb 5.9  RECEIVER (on-site recipient of call): Rocky CHRISTELLA Latch   DATE & TIME NOTIFIED: 08/05/2024 1039  MESSENGER (representative from lab): Vangie Crutch   MD NOTIFIED:  Dr. JAYSON Fendt   TIME OF NOTIFICATION: 1040  RESPONSE:  repeat cbc ordered

## 2024-08-05 NOTE — Progress Notes (Signed)
 Lab review Glucose 281 Na 121 (corrects to 124 w/ glucose) K 5.2, Mg 2.8 Cr 1.2 (up from 0.7) Lactate elevated at 5.5 Plan Will trend current labs

## 2024-08-05 NOTE — Progress Notes (Signed)
 Physical Therapy Treatment Patient Details Name: Pam [name] MRN: [id number] DOB: 04/14/1951 Today's Date: 08/05/2024   History of Present Illness Pt is a 74 y.o female admitted 12/24 for SOB and bil LE edema. Chest x-ray showed pulmonary edema. Pt is scheduled for bypass surgery on 12/29. PMH: CAD, CHF, PVD, ICD, HLD, PAD, NSTEMI, cataracts    PT Comments  Pt in bed with HoB maximally elevated with increased work of breathing. Initially, reporting that she can not work with therapy because of her PNA. PT educated pt on the benefits of movement. Pt reports she does have to use the [facility]. Pt needs supplemental O2 bumped to 7L to maintain SpO2 >90%O2 with movement. Pt with increased productive cough with sitting up and reports breathing is easier. Pt able to stand x2 for pericare during sitting on BSC. With sitting back on [facility] pt agreeable to use flutter valve, which produced additional sputum clearance. Pt with increased fatigue able to return to supine and sit with HoB maximally elevated. With recovery after transfer pt is able to return to 5L O2 via [place] with SpO2 91%O2. Pt report fatigue but easier to breathe. PT encouraged flutter valve use at least 10x per hour. D/c plans remain appropriate at this time, however pt requiring increased assist today. PT will continue to follow acutely.     If plan is discharge home, recommend the following: A little help with walking and/or transfers;A little help with bathing/dressing/bathroom;Assist for transportation;Help with stairs or ramp for entrance   Can travel by private vehicle      Maybe  Equipment Recommendations  Rolling walker (2 wheels)       Precautions / Restrictions Precautions Precautions: Fall Recall of Precautions/Restrictions: Intact Precaution/Restrictions Comments: watch o2 Restrictions Weight Bearing Restrictions Per Provider Order: No     Mobility  Bed Mobility Overal bed mobility: Needs Assistance Bed Mobility: Sit to Supine,  Supine to Sit     Supine to sit: Contact guard Sit to supine: Min assist   General bed mobility comments: contact guard for safety, with HoB elevated and use of bed rail pt requires increased time and effort to come to the EoB, light min A for bringing LE back into bed    Transfers Overall transfer level: Needs assistance Equipment used: Rolling walker (2 wheels) Transfers: Sit to/from Stand, Bed to chair/wheelchair/BSC Sit to Stand: Min assist   Step pivot transfers: Contact guard assist, Min assist       General transfer comment: min A for steadying with power up to standing and for HHA stepping to [facility], with RW in front of her she is able to stand with CGA transfer from bed to [facility], minA HHA for pivoting          Balance Overall balance assessment: Needs assistance Sitting-balance support: No upper extremity supported, Feet supported Sitting balance-Leahy Scale: Fair Sitting balance - Comments: seated EOB   Standing balance support: Single extremity supported, Bilateral upper extremity supported Standing balance-Leahy Scale: Poor Standing balance comment: reliant of RW or HHA                            Communication Communication Communication: Impaired Factors Affecting Communication: Hearing impaired  Cognition Arousal: Alert Behavior During Therapy: WFL for tasks assessed/performed   PT - Cognitive impairments: No apparent impairments  Following commands: Intact      Cueing Cueing Techniques: Verbal cues     General Comments General comments (skin integrity, edema, etc.): pt on 5L O2 via [place] with SpO2 91%O2, requires 7LO2 via [place] for transfers and sitting on BSC. while sitting upright on BSC pt able to utilize flutter valve and spit up copious amounts of bloody sputum, when returned to supine pt able to return to 5L O2 via [place] and maintanin  SpO2 91%O2.      Pertinent Vitals/Pain Pain Assessment Pain Assessment:  0-10 Pain Score: 6  Pain Location: abdomen Pain Descriptors / Indicators: Discomfort, Grimacing, Guarding Pain Intervention(s): Limited activity within patient's tolerance, Repositioned     PT Goals (current goals can now be found in the care plan section) Acute Rehab PT Goals Patient Stated Goal: to feel better Progress towards PT goals: Not progressing toward goals - comment    Frequency    Min 2X/week       AM-PAC PT 6 Clicks Mobility   Outcome Measure  Help needed turning from your back to your side while in a flat bed without using bedrails?: A Little Help needed moving from lying on your back to sitting on the side of a flat bed without using bedrails?: A Little Help needed moving to and from a bed to a chair (including a wheelchair)?: A Little Help needed standing up from a chair using your arms (e.g., wheelchair or bedside chair)?: A Little Help needed to walk in hospital room?: A Little Help needed climbing 3-5 steps with a railing? : Total 6 Click Score: 16    End of Session Equipment Utilized During Treatment: Oxygen Activity Tolerance: Patient tolerated treatment well Patient left: with call bell/phone within reach;with family/visitor present;in chair;with chair alarm set Nurse Communication: Mobility status;Other (comment) (oxygen) PT Visit Diagnosis: Other abnormalities of gait and mobility (R26.89);Muscle weakness (generalized) (M62.81);Pain;Difficulty in walking, not elsewhere classified (R26.2) Pain - part of body:  (abdomen)     Time: 8371-8281 PT Time Calculation (min) (ACUTE ONLY): 50 min  Charges:    $Therapeutic Exercise: 8-22 mins $Therapeutic Activity: 38-52 mins PT General Charges $$ ACUTE PT VISIT: 1 Visit                     Claud Gowan B. Fleeta Lapidus PT, DPT Acute Rehabilitation Services Please use secure chat or  Call Office [phone]    [name] 08/05/2024, 5:30 PM

## 2024-08-06 ENCOUNTER — Inpatient Hospital Stay (HOSPITAL_COMMUNITY)

## 2024-08-06 DIAGNOSIS — R569 Unspecified convulsions: Secondary | ICD-10-CM | POA: Diagnosis not present

## 2024-08-06 DIAGNOSIS — I5023 Acute on chronic systolic (congestive) heart failure: Secondary | ICD-10-CM

## 2024-08-06 DIAGNOSIS — J90 Pleural effusion, not elsewhere classified: Secondary | ICD-10-CM | POA: Diagnosis not present

## 2024-08-06 DIAGNOSIS — J810 Acute pulmonary edema: Secondary | ICD-10-CM | POA: Diagnosis not present

## 2024-08-06 DIAGNOSIS — D649 Anemia, unspecified: Secondary | ICD-10-CM

## 2024-08-06 DIAGNOSIS — E039 Hypothyroidism, unspecified: Secondary | ICD-10-CM | POA: Diagnosis not present

## 2024-08-06 DIAGNOSIS — J449 Chronic obstructive pulmonary disease, unspecified: Secondary | ICD-10-CM | POA: Diagnosis not present

## 2024-08-06 DIAGNOSIS — J9601 Acute respiratory failure with hypoxia: Secondary | ICD-10-CM | POA: Diagnosis not present

## 2024-08-06 DIAGNOSIS — E871 Hypo-osmolality and hyponatremia: Secondary | ICD-10-CM | POA: Diagnosis not present

## 2024-08-06 DIAGNOSIS — R7989 Other specified abnormal findings of blood chemistry: Secondary | ICD-10-CM | POA: Diagnosis not present

## 2024-08-06 LAB — RESPIRATORY PANEL BY PCR

## 2024-08-06 LAB — BASIC METABOLIC PANEL WITH GFR
Anion gap: 12 (ref 5–15)
Anion gap: 11 (ref 5–15)
Anion gap: 12 (ref 5–15)
BUN: 23 mg/dL (ref 8–23)
BUN: 20 mg/dL (ref 8–23)
BUN: 19 mg/dL (ref 8–23)
CO2: 26 mmol/L (ref 22–32)
CO2: 28 mmol/L (ref 22–32)
CO2: 27 mmol/L (ref 22–32)
Calcium: 8.9 mg/dL (ref 8.9–10.3)
Calcium: 7.9 mg/dL — ABNORMAL LOW (ref 8.9–10.3)
Calcium: 8 mg/dL — ABNORMAL LOW (ref 8.9–10.3)
Chloride: 84 mmol/L — ABNORMAL LOW (ref 98–111)
Chloride: 86 mmol/L — ABNORMAL LOW (ref 98–111)
Chloride: 87 mmol/L — ABNORMAL LOW (ref 98–111)
Creatinine, Ser: 0.94 mg/dL (ref 0.44–1.00)
Creatinine, Ser: 0.91 mg/dL (ref 0.44–1.00)
Creatinine, Ser: 0.95 mg/dL (ref 0.44–1.00)
GFR, Estimated: >60 mL/min
GFR, Estimated: >60 mL/min
GFR, Estimated: >60 mL/min
Glucose, Bld: 191 mg/dL — ABNORMAL HIGH (ref 70–99)
Glucose, Bld: 202 mg/dL — ABNORMAL HIGH (ref 70–99)
Glucose, Bld: 213 mg/dL — ABNORMAL HIGH (ref 70–99)
Potassium: 3.4 mmol/L — ABNORMAL LOW (ref 3.5–5.1)
Potassium: 4.1 mmol/L (ref 3.5–5.1)
Potassium: 3.6 mmol/L (ref 3.5–5.1)
Sodium: 122 mmol/L — ABNORMAL LOW (ref 135–145)
Sodium: 125 mmol/L — ABNORMAL LOW (ref 135–145)
Sodium: 126 mmol/L — ABNORMAL LOW (ref 135–145)

## 2024-08-06 LAB — COOXEMETRY PANEL
Carboxyhemoglobin: 2.1 % — ABNORMAL HIGH (ref 0.5–1.5)
Carboxyhemoglobin: 2.3 % — ABNORMAL HIGH (ref 0.5–1.5)
Methemoglobin: 0.8 % (ref 0.0–1.5)
Methemoglobin: 1 % (ref 0.0–1.5)
O2 Saturation: 53 %
O2 Saturation: 63.5 %
Total hemoglobin: 8.9 g/dL — ABNORMAL LOW (ref 12.0–16.0)
Total hemoglobin: 7.9 g/dL — ABNORMAL LOW (ref 12.0–16.0)

## 2024-08-06 LAB — CBC
HCT: 22.7 % — ABNORMAL LOW (ref 36.0–46.0)
HCT: 22.1 % — ABNORMAL LOW (ref 36.0–46.0)
Hemoglobin: 8 g/dL — ABNORMAL LOW (ref 12.0–15.0)
Hemoglobin: 7.4 g/dL — ABNORMAL LOW (ref 12.0–15.0)
MCH: 31.6 pg (ref 26.0–34.0)
MCH: 31.1 pg (ref 26.0–34.0)
MCHC: 35.2 g/dL (ref 30.0–36.0)
MCHC: 33.5 g/dL (ref 30.0–36.0)
MCV: 89.7 fL (ref 80.0–100.0)
MCV: 92.9 fL (ref 80.0–100.0)
Platelets: 392 K/uL (ref 150–400)
Platelets: 414 K/uL — ABNORMAL HIGH (ref 150–400)
RBC: 2.53 MIL/uL — ABNORMAL LOW (ref 3.87–5.11)
RBC: 2.38 MIL/uL — ABNORMAL LOW (ref 3.87–5.11)
RDW: 15.4 % (ref 11.5–15.5)
RDW: 15.5 % (ref 11.5–15.5)
WBC: 9.9 K/uL (ref 4.0–10.5)
WBC: 11.1 K/uL — ABNORMAL HIGH (ref 4.0–10.5)
nRBC: 0 % (ref 0.0–0.2)
nRBC: 0 % (ref 0.0–0.2)

## 2024-08-06 LAB — POCT I-STAT 7, (LYTES, BLD GAS, ICA,H+H)
Acid-base deficit: 5 mmol/L — ABNORMAL HIGH (ref 0.0–2.0)
Bicarbonate: 20.1 mmol/L (ref 20.0–28.0)
Calcium, Ion: 1.08 mmol/L — ABNORMAL LOW (ref 1.15–1.40)
HCT: 30 % — ABNORMAL LOW (ref 36.0–46.0)
Hemoglobin: 10.2 g/dL — ABNORMAL LOW (ref 12.0–15.0)
O2 Saturation: 99 %
Patient temperature: 98.6
Potassium: 4.6 mmol/L (ref 3.5–5.1)
Sodium: 118 mmol/L — CL (ref 135–145)
TCO2: 21 mmol/L — ABNORMAL LOW (ref 22–32)
pCO2 arterial: 34.7 mmHg (ref 32–48)
pH, Arterial: 7.37 (ref 7.35–7.45)
pO2, Arterial: 132 mmHg — ABNORMAL HIGH (ref 83–108)

## 2024-08-06 LAB — HEPATIC FUNCTION PANEL
ALT: 21 U/L (ref 0–44)
AST: 36 U/L (ref 15–41)
Albumin: 3.2 g/dL — ABNORMAL LOW (ref 3.5–5.0)
Alkaline Phosphatase: 54 U/L (ref 38–126)
Bilirubin, Direct: 0.2 mg/dL (ref 0.0–0.2)
Indirect Bilirubin: 0.1 mg/dL — ABNORMAL LOW (ref 0.3–0.9)
Total Bilirubin: 0.4 mg/dL (ref 0.0–1.2)
Total Protein: 5.3 g/dL — ABNORMAL LOW (ref 6.5–8.1)

## 2024-08-06 LAB — CBC WITH DIFFERENTIAL/PLATELET
Abs Immature Granulocytes: 0.11 K/uL — ABNORMAL HIGH (ref 0.00–0.07)
Abs Immature Granulocytes: 0.08 K/uL — ABNORMAL HIGH (ref 0.00–0.07)
Basophils Absolute: 0 K/uL (ref 0.0–0.1)
Basophils Absolute: 0 K/uL (ref 0.0–0.1)
Basophils Relative: 0 %
Basophils Relative: 0 %
Eosinophils Absolute: 0 K/uL (ref 0.0–0.5)
Eosinophils Absolute: 0 K/uL (ref 0.0–0.5)
Eosinophils Relative: 0 %
Eosinophils Relative: 0 %
HCT: 22.2 % — ABNORMAL LOW (ref 36.0–46.0)
HCT: 21.7 % — ABNORMAL LOW (ref 36.0–46.0)
Hemoglobin: 7.8 g/dL — ABNORMAL LOW (ref 12.0–15.0)
Hemoglobin: 7.5 g/dL — ABNORMAL LOW (ref 12.0–15.0)
Immature Granulocytes: 1 %
Immature Granulocytes: 1 %
Lymphocytes Relative: 5 %
Lymphocytes Relative: 4 %
Lymphs Abs: 0.4 K/uL — ABNORMAL LOW (ref 0.7–4.0)
Lymphs Abs: 0.5 K/uL — ABNORMAL LOW (ref 0.7–4.0)
MCH: 31.6 pg (ref 26.0–34.0)
MCH: 31.5 pg (ref 26.0–34.0)
MCHC: 35.1 g/dL (ref 30.0–36.0)
MCHC: 34.6 g/dL (ref 30.0–36.0)
MCV: 89.9 fL (ref 80.0–100.0)
MCV: 91.2 fL (ref 80.0–100.0)
Monocytes Absolute: 0.8 K/uL (ref 0.1–1.0)
Monocytes Absolute: 1 K/uL (ref 0.1–1.0)
Monocytes Relative: 8 %
Monocytes Relative: 9 %
Neutro Abs: 7.8 K/uL — ABNORMAL HIGH (ref 1.7–7.7)
Neutro Abs: 9.3 K/uL — ABNORMAL HIGH (ref 1.7–7.7)
Neutrophils Relative %: 86 %
Neutrophils Relative %: 86 %
Platelets: 374 K/uL (ref 150–400)
Platelets: 389 K/uL (ref 150–400)
RBC: 2.47 MIL/uL — ABNORMAL LOW (ref 3.87–5.11)
RBC: 2.38 MIL/uL — ABNORMAL LOW (ref 3.87–5.11)
RDW: 15.3 % (ref 11.5–15.5)
RDW: 15.5 % (ref 11.5–15.5)
WBC: 9.1 K/uL (ref 4.0–10.5)
WBC: 10.8 K/uL — ABNORMAL HIGH (ref 4.0–10.5)
nRBC: 0 % (ref 0.0–0.2)
nRBC: 0 % (ref 0.0–0.2)

## 2024-08-06 LAB — GLUCOSE, CAPILLARY
Glucose-Capillary: 110 mg/dL — ABNORMAL HIGH (ref 70–99)
Glucose-Capillary: 121 mg/dL — ABNORMAL HIGH (ref 70–99)
Glucose-Capillary: 133 mg/dL — ABNORMAL HIGH (ref 70–99)
Glucose-Capillary: 141 mg/dL — ABNORMAL HIGH (ref 70–99)
Glucose-Capillary: 175 mg/dL — ABNORMAL HIGH (ref 70–99)
Glucose-Capillary: 196 mg/dL — ABNORMAL HIGH (ref 70–99)
Glucose-Capillary: 94 mg/dL (ref 70–99)

## 2024-08-06 LAB — IRON AND TIBC
Iron: 11 ug/dL — ABNORMAL LOW (ref 28–170)
Saturation Ratios: 3 % — ABNORMAL LOW (ref 10.4–31.8)
TIBC: 360 ug/dL (ref 250–450)
UIBC: 349 ug/dL

## 2024-08-06 LAB — PHOSPHORUS: Phosphorus: 3 mg/dL (ref 2.5–4.6)

## 2024-08-06 LAB — CG4 I-STAT (LACTIC ACID)
Lactic Acid, Venous: 2.1 mmol/L (ref 0.5–1.9)
Lactic Acid, Venous: 1.1 mmol/L (ref 0.5–1.9)

## 2024-08-06 LAB — FERRITIN: Ferritin: 56 ng/mL (ref 11–307)

## 2024-08-06 LAB — MAGNESIUM
Magnesium: 2.3 mg/dL (ref 1.7–2.4)
Magnesium: 2.2 mg/dL (ref 1.7–2.4)
Magnesium: 2.3 mg/dL (ref 1.7–2.4)

## 2024-08-06 LAB — MRSA NEXT GEN BY PCR, NASAL: MRSA by PCR Next Gen: NOT DETECTED

## 2024-08-06 LAB — PROTIME-INR
INR: 1.1 (ref 0.8–1.2)
Prothrombin Time: 15.2 s (ref 11.4–15.2)

## 2024-08-06 LAB — TROPONIN T, HIGH SENSITIVITY: Troponin T High Sensitivity: 209 ng/L (ref 0–19)

## 2024-08-06 MED ORDER — CLOPIDOGREL BISULFATE 75 MG PO TABS
75.0000 mg | ORAL_TABLET | Freq: Every day | ORAL | Status: DC
  Administered 2024-08-06: 75 mg via ORAL
  Filled 2024-08-06: qty 1

## 2024-08-06 MED ORDER — METHYLPREDNISOLONE SODIUM SUCC 40 MG IJ SOLR
40.0000 mg | Freq: Two times a day (BID) | INTRAMUSCULAR | Status: DC
  Administered 2024-08-06: 40 mg via INTRAVENOUS
  Filled 2024-08-06: qty 1

## 2024-08-06 MED ORDER — TOLVAPTAN 15 MG PO TABS
15.0000 mg | ORAL_TABLET | Freq: Once | ORAL | Status: AC
  Administered 2024-08-06: 15 mg via ORAL
  Filled 2024-08-06: qty 1

## 2024-08-06 MED ORDER — OXIDIZED CELLULOSE EX PADS
1.0000 | MEDICATED_PAD | Freq: Once | CUTANEOUS | Status: AC
  Administered 2024-08-06: 1 via TOPICAL
  Filled 2024-08-06: qty 1

## 2024-08-06 MED ORDER — POTASSIUM CHLORIDE CRYS ER 20 MEQ PO TBCR
20.0000 meq | EXTENDED_RELEASE_TABLET | Freq: Once | ORAL | Status: AC
  Administered 2024-08-06: 20 meq via ORAL
  Filled 2024-08-06: qty 1

## 2024-08-06 MED ORDER — SODIUM CHLORIDE 0.9 % IV SOLN: 250.0000 mL | INTRAVENOUS | Status: DC | PRN

## 2024-08-06 MED ORDER — ORAL CARE MOUTH RINSE: 15.0000 mL | OROMUCOSAL | Status: DC | PRN

## 2024-08-06 MED ORDER — POTASSIUM CHLORIDE 10 MEQ/50ML IV SOLN
10.0000 meq | INTRAVENOUS | Status: AC
  Administered 2024-08-06 (×6): 10 meq via INTRAVENOUS
  Filled 2024-08-06: qty 50

## 2024-08-06 MED ORDER — POTASSIUM CHLORIDE CRYS ER 20 MEQ PO TBCR
40.0000 meq | EXTENDED_RELEASE_TABLET | Freq: Once | ORAL | Status: AC
  Administered 2024-08-06: 40 meq via ORAL
  Filled 2024-08-06: qty 2

## 2024-08-06 MED ORDER — METHYLPREDNISOLONE SODIUM SUCC 40 MG IJ SOLR: 40.0000 mg | Freq: Two times a day (BID) | INTRAMUSCULAR | Status: DC

## 2024-08-06 MED ORDER — ORAL CARE MOUTH RINSE
15.0000 mL | OROMUCOSAL | Status: DC
  Administered 2024-08-06 – 2024-08-14 (×29): 15 mL via OROMUCOSAL

## 2024-08-06 MED ORDER — SODIUM CHLORIDE 0.9% FLUSH: 3.0000 mL | INTRAVENOUS | Status: DC | PRN

## 2024-08-06 MED ORDER — REVEFENACIN 175 MCG/3ML IN SOLN
175.0000 ug | Freq: Every day | RESPIRATORY_TRACT | Status: DC
  Administered 2024-08-06 – 2024-08-13 (×7): 175 ug via RESPIRATORY_TRACT
  Filled 2024-08-06 (×8): qty 3

## 2024-08-06 MED ORDER — SODIUM CHLORIDE 0.9% FLUSH
3.0000 mL | Freq: Two times a day (BID) | INTRAVENOUS | Status: DC
  Administered 2024-08-07: 3 mL via INTRAVENOUS

## 2024-08-06 MED ORDER — ARFORMOTEROL TARTRATE 15 MCG/2ML IN NEBU
15.0000 ug | INHALATION_SOLUTION | Freq: Two times a day (BID) | RESPIRATORY_TRACT | Status: DC
  Administered 2024-08-06 – 2024-08-13 (×15): 15 ug via RESPIRATORY_TRACT
  Filled 2024-08-06 (×16): qty 2

## 2024-08-06 MED ORDER — POTASSIUM CHLORIDE 10 MEQ/50ML IV SOLN: 10.0000 meq | INTRAVENOUS | Status: DC

## 2024-08-06 MED ORDER — SPIRONOLACTONE 25 MG PO TABS
25.0000 mg | ORAL_TABLET | Freq: Every day | ORAL | Status: DC
  Administered 2024-08-06 – 2024-08-11 (×6): 25 mg via ORAL
  Filled 2024-08-06 (×7): qty 1

## 2024-08-06 NOTE — Progress Notes (Addendum)
 Fluid and Electrolyte orders:   Hyponatremia 122 (corrects to 123 w/ current glucose)  Hypokalemia: K 3.4    Plan 6 runs KCL    Additional lab comments:  Lactic acid trending down 5.7-->2.1 Trop 210-->209 Co-ox holding @ 53 c/w yesterday around this time   Still awaiting formal read on CT head but I do not see anything acute     Maude FORBES Banner ACNP-BC [facility] Pulmonary/Critical Care Pager # [phone] OR # [phone] if no answer

## 2024-08-06 NOTE — Plan of Care (Signed)
   Problem: Education: Goal: Knowledge of General Education information will improve Description Including pain rating scale, medication(s)/side effects and non-pharmacologic comfort measures Outcome: Progressing   Problem: Clinical Measurements: Goal: Diagnostic test results will improve Outcome: Progressing Goal: Respiratory complications will improve Outcome: Progressing

## 2024-08-06 NOTE — Progress Notes (Signed)
 Occupational Therapy Treatment Patient Details Name: Pam [name] MRN: [id number] DOB: 1951-07-30 Today's Date: 08/06/2024   History of present illness Pt is a 74 y.o female admitted 12/24 for SOB and bil LE edema. Chest x-ray showed pulmonary edema. s/p 12/29 Right sided axillobifemoral bypass, Right common femoral endarterectomy. 1/5 evening hrs had 4 episodes of VT triggering ICD shocks and transferred to ICU with progressive respiratory failure.    PMH: CAD, CHF, PVD, ICD, HLD, PAD, NSTEMI, cataracts   OT comments  Pt transferred to ICU. Pt taken off of BiPap by nsg and placed on 15L HFNC to work with OT. Pt motivated with good participation. Able to stand on scale to weigh then completed at least 6 sit<>stands with min A for hygiene and to change linens. Pt assisted with washing her hair while seated EOB. VSS; no c/o dizziness. Given decline in function and prior level of independence, feel Pam [name] will benefit from intensive inpatient follow-up therapy, >3 hours/day to maximize functional level of independence with goal to DC home with husband. Acute OT to follow.       If plan is discharge home, recommend the following:  A little help with walking and/or transfers;A little help with bathing/dressing/bathroom;Assistance with cooking/housework;Assist for transportation;Help with stairs or ramp for entrance   Equipment Recommendations  BSC/3in1;Tub/shower bench;Other (comment)    Recommendations for Other Services      Precautions / Restrictions Precautions Precautions: Fall Recall of Precautions/Restrictions: Intact Precaution/Restrictions Comments: watch o2; wound vac; stage 3 sacral wounds Restrictions Weight Bearing Restrictions Per Provider Order: No       Mobility Bed Mobility Overal bed mobility: Needs Assistance Bed Mobility: Sidelying to Sit, Sit to Supine   Sidelying to sit: Mod assist   Sit to supine: Mod assist        Transfers Overall transfer level: Needs  assistance Equipment used: None, 1 person hand held assist (used scale like a stedy) Transfers: Sit to/from Stand Sit to Stand: Min assist                 Balance     Sitting balance-Leahy Scale: Fair       Standing balance-Leahy Scale: Poor Standing balance comment: reliant on external support                           ADL either performed or assessed with clinical judgement   ADL Overall ADL's : Needs assistance/impaired Eating/Feeding: Set up;Supervision/ safety   Grooming: Minimal assistance   Upper Body Bathing: Minimal assistance;Sitting   Lower Body Bathing: Moderate assistance;Sit to/from stand   Upper Body Dressing : Moderate assistance;Sitting   Lower Body Dressing: Maximal assistance;Sit to/from stand   Toilet Transfer: Minimal assistance   Toileting- Clothing Manipulation and Hygiene: Total assistance       Functional mobility during ADLs: Minimal assistance      Extremity/Trunk Assessment Upper Extremity Assessment Upper Extremity Assessment: Generalized weakness   Lower Extremity Assessment Lower Extremity Assessment: Defer to PT evaluation        Vision       Perception     Praxis     Communication Communication Communication: Impaired Factors Affecting Communication: Hearing impaired   Cognition Arousal: Alert Behavior During Therapy: [facility] for tasks assessed/performed Cognition:  (will further assess; cues to open eyes at times; following commands)  Following commands: Intact        Cueing      Exercises Exercises: General Lower Extremity General Exercises - Lower Extremity Ankle Circles/Pumps: AROM, Both, 20 reps, Supine Heel Slides: AROM, Both, 5 reps, Supine Hip Flexion/Marching: AROM, Strengthening, Seated, 5 reps, Both    Shoulder Instructions       General Comments      Pertinent Vitals/ Pain       Pain Assessment Pain Assessment: Faces Faces Pain  Scale: Hurts little more Pain Location: abdomen; bottom Pain Descriptors / Indicators: Discomfort, Grimacing, Guarding Pain Intervention(s): Limited activity within patient's tolerance  Home Living                                          Prior Functioning/Environment              Frequency  Min 2X/week        Progress Toward Goals  OT Goals(current goals can now be found in the care plan section)  Progress towards OT goals: Not progressing toward goals - comment;OT to reassess next treatment (medical downgrade to ICU)     Plan      Co-evaluation                 AM-PAC OT 6 Clicks Daily Activity     Outcome Measure   Help from another person eating meals?: A Little Help from another person taking care of personal grooming?: A Little Help from another person toileting, which includes using toliet, bedpan, or urinal?: A Lot Help from another person bathing (including washing, rinsing, drying)?: A Lot Help from another person to put on and taking off regular upper body clothing?: A Little Help from another person to put on and taking off regular lower body clothing?: A Lot 6 Click Score: 15    End of Session Equipment Utilized During Treatment: Oxygen (15 L)  OT Visit Diagnosis: Unsteadiness on feet (R26.81);Other abnormalities of gait and mobility (R26.89);Muscle weakness (generalized) (M62.81);Pain Pain - Right/Left: Right Pain - part of body:  (abdomen; bottom)   Activity Tolerance Patient tolerated treatment well   Patient Left in bed;with call bell/phone within reach;with bed alarm set;with nursing/sitter in room   Nurse Communication Mobility status        Time: 8261-8194 OT Time Calculation (min): 27 min  Charges: OT General Charges $OT Visit: 1 Visit OT Treatments $Self Care/Home Management : 23-37 mins  Kreg Sink, OT/L   Acute OT Clinical Specialist Acute Rehabilitation Services Pager [phone] Office  [phone]   [facility] 08/06/2024, 6:23 PM

## 2024-08-06 NOTE — Progress Notes (Signed)
 EEG complete - results pending

## 2024-08-06 NOTE — Procedures (Signed)
 Patient Name: Pam [name]  MRN: [id number]  Epilepsy Attending: Arlin MALVA Krebs  Referring Physician/Provider: Khaliqdina, Salman, MD  Date: 08/06/2024 Duration: 23.09 mins  Patient history: 74 year old female with 2 brief episodes of stiffening.  EEG to evaluate for seizure.  Level of alertness: Awake, asleep  AEDs during EEG study: None  Technical aspects: This EEG study was done with scalp electrodes positioned according to the 10-20 International system of electrode placement. Electrical activity was reviewed with band pass filter of 1-70Hz , sensitivity of 7 uV/mm, display speed of 68mm/sec with a 60Hz  notched filter applied as appropriate. EEG data were recorded continuously and digitally stored.  Video monitoring was available and reviewed as appropriate.  Description: The posterior dominant rhythm consists of 8-9 Hz activity of moderate voltage (25-35 uV) seen predominantly in posterior head regions, symmetric and reactive to eye opening and eye closing. Sleep was characterized by vertex waves, sleep spindles (12 to 14 Hz), maximal frontocentral region.  Hyperventilation and photic stimulation were not performed.     Of note, study was technically difficult due to significant myogenic artifact.  IMPRESSION: This technically difficult study is within normal limits. No seizures or epileptiform discharges were seen throughout the recording.  A normal interictal EEG does not exclude the diagnosis of epilepsy.   Jovan Colligan O Kazimierz Springborn

## 2024-08-06 NOTE — Progress Notes (Signed)
 Initial Nutrition Assessment  DOCUMENTATION CODES:   Non-severe (moderate) malnutrition in context of chronic illness  INTERVENTION:   Recommend liberalized diet (regular) once diet advanced  If unable to advance diet, recommend considering Cortrak with initiation of EN  Recommend Ensure Plus High Protein po once diet advanced,  each supplement provides 350 kcal and 20 grams of protein    NUTRITION DIAGNOSIS:   Moderate Malnutrition related to chronic illness as evidenced by moderate fat depletion, severe muscle depletion.   GOAL:   Patient will meet greater than or equal to 90% of their needs   MONITOR:   Diet advancement, PO intake, Supplement acceptance, Labs, Weight trends  REASON FOR ASSESSMENT:   Consult Assessment of nutrition requirement/status, Wound healing  ASSESSMENT:   74 yo female admitted on with acute respiratory failure secondary to heart failure, critical limb ischemia requiring R axillobifemoral bypass and common femoral endarterectomy on 12/29. Pt with acute n chronic hyponatremia (baseline in mid to upper 120s) with SIADH per Endocrinologist PMH includes ICM, HF, CAD, HTN, HLD, hypothyroidism, COPD  12/24 Admitted with Limb Ischemia 12/29 OR with Vascular Surgery: R axillobifemoral bypass and common femoral endarterectomy  1/05 Transferred to ICU, BiPap, ?seizures-neurology consulted  Pt currently on Bipap. Chest xray this AM Persistent mixed interstitial airspace process over the mid to lower lungs with small bilateral pleural effusions without significant change. Findings likely reflect infection and less likely edema per Radiologist  Currently NPO.  Noted pt previously on Renal/Carb Modified with 1200 mL fluid restriction, recorded po intake 100% of meals but nothing recorded since Breakfast on 1/01.  Pt wears dentures but not currently in place per RN  Unable to get detailed history and physical from pt at this time due to Bipap (pt  attempting to speak but difficult to understand), no family present.   +serous drainage from R groin (likely due to edema). Prevena VAC applied to R groin today (planning to leave in place for 7 days per Vascular)  CBG 94 at 1105 but serum glucose 202 at 1143. Question accuracy  Current wt: 55.7 kg Admit wt: 52 kg  Currently on lasix  gtt. Received tolvaptan  x 1 today  Reviewed WOC notes from 1/05. Pt with stage 3 pressure injuries to L/R buttock; Pt with stage 2 PI to sacrum on admission which as worsened and now a DTI  Anemia Panel ordered per MD, noted Iron infusions ordered  Labs: Sodium 125 (L)-serum glucose 202 Potassium 4.1 (wdl) BUN/Creatinine wdl Phosphorus 3.0 (wdl) Magnesium  2.2 (wdl)  Meds: SS novolog  Solumedrol   NUTRITION - FOCUSED PHYSICAL EXAM:  Flowsheet Row Most Recent Value  Orbital Region Moderate depletion  Upper Arm Region Moderate depletion  Thoracic and Lumbar Region Moderate depletion  Buccal Region Unable to assess  [Bipap]  Temple Region Moderate depletion  Clavicle Bone Region Severe depletion  Clavicle and Acromion Bone Region Severe depletion  Scapular Bone Region Severe depletion  Dorsal Hand Unable to assess  Patellar Region Severe depletion  Anterior Thigh Region Severe depletion  Posterior Calf Region Severe depletion  Edema (RD Assessment) Mild  Hair Reviewed  Eyes Reviewed  Mouth Unable to assess  [Bipap]  Skin Reviewed  Nails Reviewed    Diet Order:   Diet Order             Diet NPO time specified Except for: Sips with Meds  Diet effective now  EDUCATION NEEDS:   Not appropriate for education at this time  Skin:  Skin Assessment: Skin Integrity Issues: Skin Integrity Issues:: Unstageable Unstageable: mid sacrum (documented on 1/05-progressed from previously documented stage 2)  Last BM:  1/06  Height:   Ht Readings from Last 1 Encounters:  07/24/24 5' 3 (1.6 m)    Weight:   Wt  Readings from Last 1 Encounters:  08/06/24 55.7 kg     BMI:  Body mass index is 21.75 kg/m.  Estimated Nutritional Needs:   Kcal:  1550-1750 kcals  Protein:  75-85 g  Fluid:  1.5 L   Betsey Finger MS, RDN, LDN, CNSC Registered Dietitian 3 Clinical Nutrition RD Inpatient Contact Info in Amion

## 2024-08-06 NOTE — Progress Notes (Signed)
 "  NAME:  Pam [name], MRN:  [id number], DOB:  03/20/1951, LOS: 13 ADMISSION DATE:  07/24/2024,  CHIEF COMPLAINT: Acute hypercapnic respiratory failure  History of Present Illness:  74 year old female w/ h/o HFrEF, has ICD, HTN, PAD, HLD, came in initially on 12/24 w/ right sided leg pain and ischemia. Went to OR for right axillobifemoral bypass and fight common femoral endartectomy on 12/29.    Course has been c/b post op hypotension; placed on midodrine , volume overload w/ no sig response to IV diuresis. Hyponatremia (acute on chronic) baseline mid 120s, got tolvaptan  on 1/2.  ADV HF consulted on 1/2. PICC placed (CVP ~11) wt still up, diuretics adjusted. On 1/4 still reporting increased SOB. CXR raising concern for PNA so abx started. Na down to 124. 1/5 seen again by adv HF. Wheezing, steroids and BDs added. Milrinone  started. Lasix  up to 80mg /d, Spiro added. Na 128->125   On 1/5 evening hrs had 4 episodes of VT triggering ICD shocks. Administered additional amiodarone  bolus. Milrinone  stopped. K 3.2 , Mg 1.6. has chronic LAD. W/ mild ST elevation. During events there was change in mental status with concern for possible seizure however this was felt primarily to be syncopal event associated with the VT.   On 1/5 she was transferred to the intensive care for progressive respiratory failure and hemodynamic instability including recurrent VT    Pertinent  Medical History   Past Medical History:  Diagnosis Date   Abnormal EKG    AICD (automatic cardioverter/defibrillator) present    [employer]   Alcoholic intoxication without complication    Allergy    Cardiomyopathy, ischemic    Cataract    Chest pain 08/29/2016   Chest pain 08/29/2016   CHF (congestive heart failure) (HCC)    Chronic systolic heart failure (HCC) 08/29/2016   Coronary artery disease    Facial laceration    Fall 07/07/2016   Hyperlipidemia    Hypertension 03/03/2017   Hyponatremia    Hypothyroidism 07/08/2016    NSTEMI (non-ST elevated myocardial infarction) [facility])    Syncope    Thyroid  disease      Significant Hospital Events: Including procedures, antibiotic start and stop dates in addition to other pertinent events   12/24 admitted w/ critical limb ischemia 12/29 right axillobifemoral bypass and fight common femoral endartectomy   1/2.  ADV HF consulted on 1/2. PICC placed (CVP ~11) wt still up, diuretics adjusted.  1/4 still reporting increased SOB. CXR raising concern for PNA so abx started. Na down to 124.  evening hrs had 4 episodes of VT triggering ICD shocks. Administered additional amiodarone  bolus.  08/05/2024 - Moved to ICU on BiPaP for ARF.  Interim History / Subjective:  Seen and examined patient this morning in the ICU.  Patient is comfortable on BiPAP.  Denies any chest pain.  She has some bleeding from the left upper extremity because of heparin  shot.  Rollerball is applied there.  Hemoglobin is trending down but no obvious severe enough bleeding to explain this.  Hemodynamically stable.  Will keep monitoring hemoglobin.  Objective    Blood pressure (!) 110/47, pulse 95, temperature 100 F (37.8 C), resp. rate 14, height 5' 3 (1.6 m), weight 55.7 kg, SpO2 91%. CVP:  [1 mmHg-11 mmHg] 11 mmHg  Vent Mode: PCV;BIPAP FiO2 (%):  [40 %-100 %] 40 % Set Rate:  [15 bmp] 15 bmp PEEP:  [5 cmH20] 5 cmH20   Intake/Output Summary (Last 24 hours) at 08/06/2024 1538 Last data filed  at 08/06/2024 1500 Gross per 24 hour  Intake 1086.28 ml  Output 2570 ml  Net -1483.72 ml   Filed Weights   08/06/24 0000 08/06/24 0448  Weight: 55.7 kg 55.7 kg    Examination: Physical exam: General: Chronically ill-appearing female, lying on the bed, Comfortable on BiPAP. HEENT: [place]/AT, eyes anicteric.  moist mucus membranes Neuro: Alert, awake following commands Chest: Coarse breath sounds, no wheezes or rhonchi Heart: Regular rate and rhythm, no murmurs or gallops Abdomen: Soft, nontender, nondistended,  bowel sounds present    Resolved problem list   Assessment and Plan   Acute hypoxic respiratory failure secondary to diffuse pulmonary infiltrates consistent with Worsening pulmonary edema, complicated further by small bilateral pleural effusions, +/- pneumonia Concerns for COPD exacebration Wheezing likely cardiac wheeze in nature from heart failure.  Has had limited response to diuresis so far Plan Initiate noninvasive positive pressure ventilation On Lasix  drip at 10 mg/h. Appreciate cardiology input if no significant improvement will consider right heart cath. Will continue scheduled bronchodilators Continuous pulse oximetry N.p.o. Currently day #3 Zosyn , MRSA PCR was -24 hours again so no need to start vancomycin Continue IV steroids for now   Acute on chronic systolic heart failure.  Baseline EF 30 to 35%, current EF on 1 1 remains the same at 130 to 35%.  There is left ventricular regional wall abnormalities RV size moderately enlarged with moderately enlarged pulmonary artery systolic pressures Plan Continue telemetry monitoring Transduce central venous pressure Aggressive diuresis Inotrope he placed on hold due to recurrent VT Ensure mean arterial pressure greater than 65 SCVO2 is 63.5 Iron level is low infusions ordered by cardiology team.  Acute on Chronic Anemia No obvious bleeding Monitor Hb. Low threshold to CT scan abdomen if hemodynamic unstability   History of known coronary artery disease with chronically occluded LAD.  Currently with ST elevation in V1 and V2, with elevated troponin. cardiology aware.  Current troponin is 210 which is up from 160 12 days ago.  Suspect there is a degree of demand ischemia Plan Continue telemetry monitoring Cardiac enzymes stayed stable.   VT.  Status post ICD shock x 4 Plan Continue amiodarone  infusion Ensure potassium greater than 4 and magnesium  greater than 2 Telemetry monitoring Milrinone  stopped per cardiology  given concern about possible precipitating VT   Lactic acidosis. -resolved   Brief change in mental status-delirium Plan CT head shows nothing acute.  EEG shows no seizures.   Acute on chronic hyponatremia.  Suspect secondary to heart failure. Last sodium 125. Plan Continuing diuresis Serial chemistries Strict intake output Cardiology considering tolvaptan .   Fluid and electrolyte imbalance: Hypokalemia, hypochloremia, Hypomagnesemia. Plan Aggressive replacement given Lasix  drip   Hypothyroidism Plan Continuing Synthroid    Critical limb ischemia status post right sided axillobifemoral bypass with right common femoral endarterectomy on 12/29 Mild bleeding for heparin  shot from left arm Plan Continuing Plavix  and aspirin  as well as statin   Anemia.  Without evidence of bleeding. Plan Trend CBC   Hyperglycemia. Capillary blood glucose greater than 200 currently.  Suspect steroid related Plan  sliding scale insulin       Labs   CBC: Recent Labs  Lab 08/05/24 1214 08/05/24 2058 08/05/24 2103 08/05/24 2242 08/06/24 0501 08/06/24 0622 08/06/24 1143  WBC 12.4*  --  10.3  --  9.1 9.9 10.8*  NEUTROABS  --   --   --   --  7.8*  --  9.3*  HGB 9.0*   < > 9.8* 9.9* 7.8*  8.0* 7.5*  HCT 25.9*   < > 29.1* 29.0* 22.2* 22.7* 21.7*  MCV 90.6  --  93.0  --  89.9 89.7 91.2  PLT 486*  --  585*  --  374 392 389   < > = values in this interval not displayed.    Basic Metabolic Panel: Recent Labs  Lab 08/05/24 1015 08/05/24 1308 08/05/24 2058 08/05/24 2103 08/05/24 2242 08/06/24 0200 08/06/24 0500 08/06/24 1143  NA 128* 125* 118* 121* 121* 122*  --  125*  K 2.5* 3.2* 4.6 5.2* 3.9 3.4*  --  4.1  CL 94* 90*  --  83*  --  84*  --  86*  CO2 24 25  --  23  --  26  --  28  GLUCOSE 132* 130*  --  281*  --  191*  --  202*  BUN 16 18  --  23  --  23  --  20  CREATININE 0.63 0.71  --  1.20*  --  0.94  --  0.91  CALCIUM  7.0* 7.7*  --  8.7*  --  8.9  --  7.9*  MG 1.5* 1.6*   --  2.8*  --  2.3 2.2  --   PHOS  --   --   --   --   --  3.0  --   --    GFR: Estimated Creatinine Clearance: 45.5 mL/min (by C-G formula based on SCr of 0.91 mg/dL). Recent Labs  Lab 08/02/24 1247 08/03/24 0406 08/05/24 2103 08/06/24 0133 08/06/24 0343 08/06/24 0501 08/06/24 0622 08/06/24 1143  PROCALCITON <0.10  --   --   --   --   --   --   --   WBC  --    < > 10.3  --   --  9.1 9.9 10.8*  LATICACIDVEN  --   --  5.7* 2.1* 1.1  --   --   --    < > = values in this interval not displayed.    Liver Function Tests: Recent Labs  Lab 08/01/24 0817 08/03/24 0406 08/04/24 0500 08/06/24 1143  AST 29 28 34 36  ALT 13 13 17 21   ALKPHOS 59 57 64 54  BILITOT 0.4 0.4 0.4 0.4  PROT 5.4* 5.1* 5.5* 5.3*  ALBUMIN  3.3* 3.1* 3.4* 3.2*   No results for input(s): LIPASE, AMYLASE in the last 168 hours. No results for input(s): AMMONIA in the last 168 hours.  ABG    Component Value Date/Time   PHART 7.430 08/05/2024 2242   PCO2ART 35.1 08/05/2024 2242   PO2ART 132 (H) 08/05/2024 2242   HCO3 23.5 08/05/2024 2242   TCO2 25 08/05/2024 2242   ACIDBASEDEF 1.0 08/05/2024 2242   O2SAT 63.5 08/06/2024 0522     Coagulation Profile: No results for input(s): INR, PROTIME in the last 168 hours.  Cardiac Enzymes: No results for input(s): CKTOTAL, CKMB, CKMBINDEX, TROPONINI in the last 168 hours.  HbA1C: Hgb A1c MFr Bld  Date/Time Value Ref Range Status  05/30/2024 08:51 AM 5.4 <5.7 % Final    Comment:    For the purpose of screening for the presence of diabetes: . <5.7%       Consistent with the absence of diabetes 5.7-6.4%    Consistent with increased risk for diabetes             (prediabetes) > or =6.5%  Consistent with diabetes . This assay result is consistent with a decreased  risk of diabetes. . Currently, no consensus exists regarding use of hemoglobin A1c for diagnosis of diabetes in children. . According to American Diabetes Association  (ADA) guidelines, hemoglobin A1c <7.0% represents optimal control in non-pregnant diabetic patients. Different metrics may apply to specific patient populations.  Standards of Medical Care in Diabetes(ADA). .   07/09/2016 04:28 AM 5.3 4.8 - 5.6 % Final    Comment:    (NOTE)         Pre-diabetes: 5.7 - 6.4         Diabetes: >6.4         Glycemic control for adults with diabetes: <7.0     CBG: Recent Labs  Lab 08/05/24 2210 08/06/24 0050 08/06/24 0318 08/06/24 0735 08/06/24 1105  GLUCAP 270* 196* 141* 110* 94    Review of Systems:   12 point review of systems done which is negative for anything else other than what is mentioned in HPI.  Past Medical History:  She,  has a past medical history of Abnormal EKG, AICD (automatic cardioverter/defibrillator) present, Alcoholic intoxication without complication, Allergy, Cardiomyopathy, ischemic, Cataract, Chest pain (08/29/2016), Chest pain (08/29/2016), CHF (congestive heart failure) (HCC), Chronic systolic heart failure (HCC) (98/70/7981), Coronary artery disease, Facial laceration, Fall (07/07/2016), Hyperlipidemia, Hypertension (03/03/2017), Hyponatremia, Hypothyroidism (07/08/2016), NSTEMI (non-ST elevated myocardial infarction) (HCC), Syncope, and Thyroid  disease.   Surgical History:   Past Surgical History:  Procedure Laterality Date   AXILLARY-FEMORAL BYPASS GRAFT Bilateral 07/29/2024   Procedure: CREATION, BYPASS, ARTERIAL, AXILLARY TO BILATERAL FEMORAL, USING GRAFT;  Surgeon: Lanis Fonda BRAVO, MD;  Location: [facility] OR;  Service: Vascular;  Laterality: Bilateral;   CARDIAC CATHETERIZATION N/A 07/08/2016   Procedure: Left Heart Cath and Coronary Angiography;  Surgeon: Lonni Hanson, MD;  Location: [facility] INVASIVE CV LAB;  Service: Cardiovascular;  Laterality: N/A;   CARDIAC CATHETERIZATION N/A 08/29/2016   Procedure: Coronary Atherectomy;  Surgeon: Lonni Hanson, MD;  Location: [facility] INVASIVE CV LAB;  Service: Cardiovascular;   Laterality: N/A;   CARDIAC CATHETERIZATION N/A 08/29/2016   Procedure: Coronary Stent Intervention;  Surgeon: Lonni Hanson, MD;  Location: [facility] INVASIVE CV LAB;  Service: Cardiovascular;  Laterality: N/A;   CATARACT EXTRACTION, BILATERAL     COLONOSCOPY WITH PROPOFOL  N/A 11/18/2021   Procedure: COLONOSCOPY WITH PROPOFOL ;  Surgeon: Eda Iha, MD;  Location: [facility] ENDOSCOPY;  Service: Gastroenterology;  Laterality: N/A;   CORONARY ANGIOPLASTY     coronary stents      ENDARTERECTOMY FEMORAL Bilateral 07/29/2024   Procedure: ENDARTERECTOMY, FEMORAL;  Surgeon: Lanis Fonda BRAVO, MD;  Location: [facility] OR;  Service: Vascular;  Laterality: Bilateral;   HAND SURGERY Right    Dr Alyse Gaba   ICD IMPLANT N/A 08/28/2019   Procedure: ICD IMPLANT;  Surgeon: Inocencio Soyla Lunger, MD;  Location: [facility] INVASIVE CV LAB;  Service: Cardiovascular;  Laterality: N/A;   LEFT HEART CATH AND CORONARY ANGIOGRAPHY N/A 04/29/2022   Procedure: LEFT HEART CATH AND CORONARY ANGIOGRAPHY;  Surgeon: Anner Alm ORN, MD;  Location: [facility] INVASIVE CV LAB;  Service: Cardiovascular;  Laterality: N/A;   LOWER EXTREMITY ANGIOGRAPHY N/A 06/19/2024   Procedure: Lower Extremity Angiography;  Surgeon: Darron Deatrice LABOR, MD;  Location: [facility] INVASIVE CV LAB;  Service: Cardiovascular;  Laterality: N/A;   PARATHYROIDECTOMY Right 02/02/2021   Procedure: RIGHT SUPERIOR PARATHYROIDECTOMY;  Surgeon: Eletha Boas, MD;  Location: [facility] ORS;  Service: General;  Laterality: Right;   POLYPECTOMY  11/18/2021   Procedure: POLYPECTOMY;  Surgeon: Eda Iha, MD;  Location: [facility] ENDOSCOPY;  Service: Gastroenterology;;  RIGHT HEART CATH N/A 08/11/2022   Procedure: RIGHT HEART CATH;  Surgeon: Gardenia Led, DO;  Location: [facility] INVASIVE CV LAB;  Service: Cardiovascular;  Laterality: N/A;   VAGINAL HYSTERECTOMY       Social History:   reports that she quit smoking about 8 years ago. Her smoking use included cigarettes. She started smoking about  58 years ago. She has a 50 pack-year smoking history. She has never used smokeless tobacco. She reports that she does not currently use alcohol. She reports that she does not use drugs.   Family History:  Her family history includes Heart disease in her mother and sister; Kidney failure in her mother. There is no history of Colon cancer, Esophageal cancer, Rectal cancer, or Stomach cancer.   Allergies Allergies[1]   Home Medications  Prior to Admission medications  Medication Sig Start Date End Date Taking? Authorizing Provider  acetaminophen  (TYLENOL ) 500 MG tablet Take 1,000 mg by mouth every 6 (six) hours as needed for moderate pain.   Yes [provider]  albuterol  (VENTOLIN  HFA) 108 (90 Base) MCG/ACT inhaler Inhale 2 puffs into the lungs every 6 (six) hours as needed for wheezing or shortness of breath. 12/28/23  Yes Hope Almarie ORN, NP  aspirin  81 MG chewable tablet Chew 81 mg by mouth daily.   Yes [provider]  atorvastatin  (LIPITOR ) 80 MG tablet TAKE 1 TABLET(80 MG) BY MOUTH DAILY 11/27/18  Yes Verlin Lonni BIRCH, MD  bisoprolol  (ZEBETA ) 5 MG tablet Take 0.5 tablets (2.5 mg total) by mouth daily. 06/26/24  Yes Milford, Harlene HERO, FNP  CALCIUM  PO Take 1 tablet by mouth daily.   Yes [provider]  Cholecalciferol (VITAMIN D3) 50 MCG (2000 UT) TABS Take 2,000 Units by mouth daily.   Yes [provider]  clopidogrel  (PLAVIX ) 75 MG tablet TAKE 1 TABLET(75 MG) BY MOUTH DAILY WITH BREAKFAST 11/27/23  Yes Sabharwal, Aditya, DO  digoxin  (LANOXIN ) 0.125 MG tablet Take 0.5 tablets (0.0625 mg total) by mouth daily. 11/22/23  Yes Milford, Harlene HERO, FNP  furosemide  (LASIX ) 20 MG tablet TAKE 1 TABLET BY MOUTH AS NEEDED 02/27/24  Yes Sabharwal, Aditya, DO  levothyroxine  (SYNTHROID ) 25 MCG tablet Take 37.5 mcg by mouth daily before breakfast. 07/10/23  Yes Thapa, Sudan, MD  nitroGLYCERIN  (NITROSTAT ) 0.4 MG SL tablet Place 1 tablet (0.4 mg total) under the  tongue every 5 (five) minutes x 3 doses as needed for chest pain. 06/14/24  Yes Verlin Lonni BIRCH, MD  nystatin cream (MYCOSTATIN) Apply 1 Application topically 2 (two) times daily as needed (rash). 05/09/22  Yes [provider]  ranolazine  (RANEXA ) 1000 MG SR tablet TAKE 1 TABLET(1000 MG) BY MOUTH TWICE DAILY 02/09/24  Yes Milford, [place], FNP  sacubitril -valsartan  (ENTRESTO ) 49-51 MG Take 1 tablet by mouth 2 (two) times daily. 05/01/24  Yes Clegg, Amy D, NP  spironolactone  (ALDACTONE ) 25 MG tablet TAKE 1 TABLET(25 MG) BY MOUTH DAILY 07/03/23  Yes Sabharwal, Aditya, DO  umeclidinium-vilanterol (ANORO ELLIPTA ) 62.5-25 MCG/ACT AEPB Inhale 1 puff into the lungs daily. 12/28/23  Yes Hope Almarie ORN, NP  valACYclovir  (VALTREX ) 1000 MG tablet Take 1,000 mg by mouth daily.   Yes [provider]     Critical care time: 35 minutes    Tamela Stakes, MD  Attending Physician, Critical Care Medicine [facility] See Amion for pager If no response to pager, please call [phone] until 7pm After 7pm, Please call E-link [phone]           [  1]  Allergies Allergen Reactions   Lisinopril  Cough    Dry cough   "

## 2024-08-06 NOTE — Progress Notes (Addendum)
 "    Advanced Heart Failure Rounding Note  Cardiologist: Lonni Cash, MD  [facility] Cardiologist: Establishing Dr. Rolan  Patient Profile   Pam [name] is a 74 y.o. female with  history of chronic HFrEF/mixed cardiomyopathy, CAD, PAD, chronic hyponatremia, COPD.    Admitted with acute respiratory failure, acute on chronic CHF and hyponatremia.   Significant Events:    1/5: started on Milrinone  for low co-ox, 53% 1/5: moved to CCU for acute hypoxic respiratory failure requiring Bipap, ?seizure like activity. CXR w/ pulm edema. Runs of VT treated w/ ICD shocks, milrinone  d/ced  (K 3.2, Mg 1.6)   Subjective:    Now resting comfortably on BiPAP. No further VT on tele. Continues on amio gtt at 30/hr   Mg corrected, 2.2 this morning. K 3.4 (getting additional IV K runs)   Co-ox 64% off Milrinone .   Lactic acid cleared, 5.7>>2.1>>1.1  Diuresing well w/ Lasix  gtt. CVP reading low but remains well above admit wt.   On empiric abx per primary team for possible PNA. Respiratory panel pending.    Objective:   Weight Range: 55.7 kg Body mass index is 21.75 kg/m.   Vital Signs:   Temp:  [97.1 F (36.2 C)-98 F (36.7 C)] 97.2 F (36.2 C) (01/05 2203) Pulse Rate:  [80-136] 80 (01/06 0300) Resp:  [12-34] 12 (01/06 0400) BP: (117-165)/(48-88) 130/50 (01/06 0400) SpO2:  [79 %-100 %] 91 % (01/06 0300) FiO2 (%):  [36 %-100 %] 80 % (01/06 0218) Weight:  [55.7 kg] 55.7 kg (01/06 0448) Last BM Date : 08/06/24  Weight change: Filed Weights   08/06/24 0000 08/06/24 0448  Weight: 55.7 kg 55.7 kg    Intake/Output:   Intake/Output Summary (Last 24 hours) at 08/06/2024 0713 Last data filed at 08/06/2024 0500 Gross per 24 hour  Intake 620.45 ml  Output 2350 ml  Net -1729.55 ml     Physical Exam   General:  frail, chronically ill appearing F on Bipap.   Cor: RRR. No MRGs. JVD not well visualized.  Lungs: diminished at the bases bilaterally, diffuse expiratory wheezing   Extremities: warm and dry. No LEE   Telemetry   NSR 80s-90s, no further VT, personally reviewed   Labs   CBC Recent Labs    08/06/24 0501 08/06/24 0622  WBC 9.1 9.9  NEUTROABS 7.8*  --   HGB 7.8* 8.0*  HCT 22.2* 22.7*  MCV 89.9 89.7  PLT 374 392   Basic Metabolic Panel Recent Labs    98/94/73 2103 08/05/24 2242 08/06/24 0200 08/06/24 0500  NA 121* 121* 122*  --   K 5.2* 3.9 3.4*  --   CL 83*  --  84*  --   CO2 23  --  26  --   GLUCOSE 281*  --  191*  --   BUN 23  --  23  --   CREATININE 1.20*  --  0.94  --   CALCIUM  8.7*  --  8.9  --   MG 2.8*  --  2.3 2.2  PHOS  --   --  3.0  --    Liver Function Tests Recent Labs    08/04/24 0500  AST 34  ALT 17  ALKPHOS 64  BILITOT 0.4  PROT 5.5*  ALBUMIN  3.4*   No results for input(s): LIPASE, AMYLASE in the last 72 hours. Cardiac Enzymes No results for input(s): CKTOTAL, CKMB, CKMBINDEX, TROPONINI in the last 72 hours.  BNP: BNP (last 3 results) Recent Labs  11/22/23 1129 03/26/24 1423 05/29/24 1206  BNP 472.0* 614.1* 841.1*    ProBNP (last 3 results) Recent Labs    07/24/24 1016 08/02/24 1551  PROBNP 4,537.0* 5,039.0*     D-Dimer No results for input(s): DDIMER in the last 72 hours. Hemoglobin A1C No results for input(s): HGBA1C in the last 72 hours. Fasting Lipid Panel No results for input(s): CHOL, HDL, LDLCALC, TRIG, CHOLHDL, LDLDIRECT in the last 72 hours. Medications:   Scheduled Medications:  acetylcysteine   4 mL Nebulization TID   amiodarone   150 mg Intravenous STAT   aspirin  EC  81 mg Oral Q0600   atorvastatin   80 mg Oral Daily   Chlorhexidine  Gluconate Cloth  6 each Topical Daily   collagenase    Topical Daily   feeding supplement  237 mL Oral TID WC   heparin   5,000 Units Subcutaneous Q8H   insulin  aspart  0-15 Units Subcutaneous Q4H   ipratropium-albuterol   3 mL Nebulization Q4H   levothyroxine   37.5 mcg Oral Q0600   methylPREDNISolone   (SOLU-MEDROL ) injection  40 mg Intravenous Q12H   ranolazine   1,000 mg Oral BID   sodium chloride  flush  10-40 mL Intracatheter Q12H   sodium chloride  flush  3 mL Intravenous Q12H   sodium chloride  HYPERTONIC  4 mL Nebulization BID   umeclidinium-vilanterol  1 puff Inhalation Daily   valACYclovir   1,000 mg Oral Daily    Infusions:  amiodarone  30 mg/hr (08/06/24 0500)   amiodarone      furosemide  (LASIX ) 200 mg in dextrose  5 % 100 mL (2 mg/mL) infusion 10 mg/hr (08/06/24 0500)   piperacillin -tazobactam (ZOSYN )  IV 3.375 g (08/06/24 9372)   potassium chloride  10 mEq (08/06/24 0626)    PRN Medications: acetaminophen , amiodarone , calcium  carbonate, guaiFENesin -dextromethorphan, HYDROmorphone  (DILAUDID ) injection, ondansetron  (ZOFRAN ) IV, oxyCODONE -acetaminophen , phenol, polyethylene glycol, senna, sodium chloride  flush, sodium chloride  flush  Assessment/Plan   1. Acute hypoxemic respiratory failure: Patient is a prior smoker with h/o COPD but has not been on home oxygen, but requiring supplemental O2 this admission. Now on Bipap for worsening hypoxia  - 2/2 combination of acute on chronic CHF and COPD exacerbation +/- possible PNA. Viral respiratory panel also pending - Will continue to diurese w/ lasix  gtt, see below. If not improving over next couple of days may need RHC. - Empiric abx per primary - Schedule nebs, start steroids (discussed with [facility])  2. Acute on chronic systolic CHF: Ischemic cardiomyopathy, has MDT ICD.  Echo this admission with EF 30-35%, WMAs, normal RV, IVC not dilated.   - Started on milrinone  1/5 to facilitate diuresis. Now off w/ VT. Co-ox stable today off inotrope, 64% - CXR this am w/ pulmonary edema, continue Lasix  gtt at 10/hr + aggressive K supp  - Start spiro 25 mg daily   3. CAD: NSTEMI 12/17, CTO LAD with collaterals and severe pLCx/OM1 stenosis on cath.  She had PCI to pLCx and OM1.  No chest pain. Hs trop 210>>209, likely d/t demand ishemia  - Continue  ASA 81/Plavix  - Continue statin.  - Continue ranolazine . Monitor QTc on tele/EKGs   4. Hyponatremia: This appears chronic, Na in upper 120s -130 range generally, has been seen by endocrinology in the past.  Concern for possible SIADH +/- component d/t hypervolemia - Received tolvaptan  1/2 - Na 124 today, (corrected), ? Tolvaptan     5. PAD: S/p R sided axillobifemoral bypass using PTFE and R CFA endarterectomy this admission.  - ASA/Plavix  - Statin  6. Post-op anemia - Hgb 8.0 today -  also ? Component of IDA, will check iron studies  7. Hypokalemia - K 3.4 - Supp aggressively - Add spiro 25  8. VT - multiple runs this admit, treated w/ ICD shocks - in setting of low Mg and K + Milrinone  - continue amio gtt at 30/hr  - keep off Milrinone  - continue aggressive lyte supp. Keep K > 4.0, Mg > 2.0  - monitor QTc w/ Ranexa    CRITICAL CARE Performed by: Caffie Shed   Total critical care time: 15 minutes  Critical care time was exclusive of separately billable procedures and treating other patients.  Critical care was necessary to treat or prevent imminent or life-threatening deterioration.  Critical care was time spent personally by me on the following activities: development of treatment plan with patient and/or surrogate as well as nursing, discussions with consultants, evaluation of patient's response to treatment, examination of patient, obtaining history from patient or surrogate, ordering and performing treatments and interventions, ordering and review of laboratory studies, ordering and review of radiographic studies, pulse oximetry and re-evaluation of patient's condition.    Length of Stay: [id number] PA-C  08/06/2024, 7:13 AM  Advanced Heart Failure Team Pager [phone] (M-F; 7a - 5p)   Please visit [email or web address]: For overnight coverage please call cardiology fellow first. If fellow not available call Shock/ECMO MD on call.  For ECMO / Mechanical  Support (Impella, IABP, LVAD) issues call Shock / ECMO MD on call.    Patient seen with PA, I formulated the plan and agree with the above note.   She is on Bipap this morning.  Lactate down to 1.1.  Co-ox 64% off milrinone . BP stable off midodrine . On Lasix  gtt with I/Os net negative 2277, CVP 7-8 on my read.   CXR shows bilateral lower lobe airspace disease.  Now on Zosyn .   General: On Bipap Neck: JVP 7-8 cm, no thyromegaly or thyroid  nodule.  Lungs: Occasional rhonchi.  CV: Nondisplaced PMI.  Heart regular S1/S2, no S3/S4, no murmur.  No peripheral edema.   Abdomen: Soft, nontender, no hepatosplenomegaly, no distention.  Skin: Intact without lesions or rashes.  Neurologic: Alert and oriented x 3.  Psych: Normal affect. Extremities: No clubbing or cyanosis.  HEENT: Normal.   1. Acute hypoxemic respiratory failure: Patient is a prior smoker with h/o COPD but has not been on home oxygen.  Patient is now on Bipap.  CXR with bilateral lower lobe infiltrates, but procalcitonin has not been elevated.  Initial respiratory virus panel negative.  Concern for COPD exacerbation, ?viral infection.  Volume overload also thought to contribute.  - Continue Lasix  as below.   - Resent respiratory virus panel.  - Empiric Zosyn .  - On Solumedrol 40 mg IV bid for COPD exacerbation.  2. Acute on chronic systolic CHF: Ischemic cardiomyopathy, has MDT ICD.  Echo this admission with EF 30-35%, WMAs, normal RV, IVC not dilated.  Exam is difficult for volume but increased oxygen requirement and on Bipap.  Was started on milrinone  with low co-ox but this was stopped due to VT.  Good diuresis over the last day but still on Bipap.  CVP 7-8 on my read.  Co-ox 64% this morning off milrinone . Now off midodrine .  - Continue Lasix  gtt 10 mg/hr today.    - Spironolactone  25 mg daily.  - Would aim for RHC tomorrow to better understand her hemodynamics. Discussed risks/benefits with patient and husband, they agree to  procedure.   3. CAD: NSTEMI 12/17, CTO LAD  with collaterals and severe pLCx/OM1 stenosis on cath.  She had PCI to pLCx and OM1.  No chest pain.  - Continue ASA 81 - Continue statin.  - Continue ranolazine  4. Hyponatremia: This appears chronic, Na in upper 120s -130 range generally, has been seen by endocrinology in the past.  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  ?Baseline SIADH with additional component of hypervolemic hyponatremia this admission vs worsening SIADH due to COPD exacerbation/lung infection.  - Fluid restrict.   - Continue diuresis.  - Will give tolvaptan  15 mg x 1.  5. PAD: S/p R sided axillobifemoral bypass using PTFE and R CFA endarterectomy this admission.  - ASA - Statin - [facility] following.  6. Post-op anemia - Check Fe studies.  7. VT: Multiple runs with ICD shocks in setting of low K and Mg and milrinone  initiation.  Now off milrinone .  - Continue amiodarone  gtt 30 mg/hr.   CRITICAL CARE Performed by: Ezra Shuck  Total critical care time: 45 minutes  Critical care time was exclusive of separately billable procedures and treating other patients.  Critical care was necessary to treat or prevent imminent or life-threatening deterioration.  Critical care was time spent personally by me on the following activities: development of treatment plan with patient and/or surrogate as well as nursing, discussions with consultants, evaluation of patient's response to treatment, examination of patient, obtaining history from patient or surrogate, ordering and performing treatments and interventions, ordering and review of laboratory studies, ordering and review of radiographic studies, pulse oximetry and re-evaluation of patient's condition.  Ezra Shuck 08/06/2024 9:19 AM  "

## 2024-08-06 NOTE — Progress Notes (Signed)
 Repeat hgb 8 (down from 9.8)  Hemodynamics stable On exam the no tenderness The vascular surgical sites have no hematoma  Had BM last night no evidence blood in stool Plan  Will repeat CBC at noon Will s/o to day team, may need to consider CT imaging but currently she is having some nausea so will hold off

## 2024-08-06 NOTE — Progress Notes (Signed)
 CBC reporting as 7.8 down from 9.8 earlier.  No evidence of bleeding Had error in Hgb read yesterday as well Plan Will repeat CBC

## 2024-08-06 NOTE — Progress Notes (Signed)
" °   08/06/24 2134  BiPAP/CPAP/SIPAP  BiPAP/CPAP/SIPAP Pt Type Adult  BiPAP/CPAP/SIPAP SERVO  Mask Type Full face mask  Dentures removed? Yes - Placed in denture cup  Mask Size Medium  Respiratory Rate 22 breaths/min  IPAP 10 cmH20  EPAP 5 cmH2O  Pressure Support 5 cmH20  PEEP 5 cmH20  FiO2 (%) 40 %  Minute Ventilation 16.1  Leak 18  Peak Inspiratory Pressure (PIP) 10  Tidal Volume (Vt) 826  Patient Home Machine No  Patient Home Mask No  Patient Home Tubing No  Auto Titrate No  Press High Alarm 25 cmH2O  CPAP/SIPAP surface wiped down Yes  Device Plugged into RED Power Outlet Yes  Oxygen Percent 40 %  BiPAP/CPAP /SiPAP Vitals  SpO2 100 %  Bilateral Breath Sounds Clear    "

## 2024-08-06 NOTE — Progress Notes (Addendum)
 " Progress Note    08/06/2024 6:40 AM 8 Days Post-Op  Subjective:  awake on bipap.  Husband at bedside.  When asked she shakes her head yes to feeling better than last night  Chart reviewed-respiratory distress last evening and transferred to ICU.   ? Seizure and neurology consulted.   ST elevation with Q waves and cardiology on board.   Afebrile HR 70's-130's NSR and VT 110's-160's systolic 79-100% BiPap  Vitals:   08/06/24 0300 08/06/24 0400  BP: (!) 124/48 (!) 130/50  Pulse: 80   Resp: 15 12  Temp:    SpO2: 91%     Physical Exam: General:  no distress Cardiac:  regular Lungs:  mildly labored on bipap Incisions:  bilateral groins clean, right groin with staples in tact; right chest incision is clean and dry Extremities:  on the right: faint AT doppler signal; brisk right PT doppler signal.  On the left brisk DP and PT doppler signals.  Feet floated off bed.  Sore on right ankle and right great toe.  Abdomen:  soft  CBC    Component Value Date/Time   WBC 9.9 08/06/2024 0622   RBC 2.53 (L) 08/06/2024 0622   HGB 8.0 (L) 08/06/2024 0622   HGB 13.6 06/11/2024 1039   HCT 22.7 (L) 08/06/2024 0622   HCT 39.0 06/11/2024 1039   PLT 392 08/06/2024 0622   PLT 335 06/11/2024 1039   MCV 89.7 08/06/2024 0622   MCV 102 (H) 06/11/2024 1039   MCH 31.6 08/06/2024 0622   MCHC 35.2 08/06/2024 0622   RDW 15.4 08/06/2024 0622   RDW 13.1 06/11/2024 1039   LYMPHSABS 0.4 (L) 08/06/2024 0501   LYMPHSABS 2.9 08/25/2016 1648   MONOABS 0.8 08/06/2024 0501   EOSABS 0.0 08/06/2024 0501   EOSABS 0.2 08/25/2016 1648   BASOSABS 0.0 08/06/2024 0501   BASOSABS 0.0 08/25/2016 1648    BMET    Component Value Date/Time   NA 122 (L) 08/06/2024 0200   NA 132 (L) 06/11/2024 1039   K 3.4 (L) 08/06/2024 0200   CL 84 (L) 08/06/2024 0200   CO2 26 08/06/2024 0200   GLUCOSE 191 (H) 08/06/2024 0200   BUN 23 08/06/2024 0200   BUN 15 06/11/2024 1039   CREATININE 0.94 08/06/2024 0200   CREATININE  1.13 (H) 05/30/2024 0851   CALCIUM  8.9 08/06/2024 0200   GFRNONAA >60 08/06/2024 0200   GFRAA 78 08/19/2019 0830    INR    Component Value Date/Time   INR 1.1 07/24/2024 1016     Intake/Output Summary (Last 24 hours) at 08/06/2024 0640 Last data filed at 08/06/2024 0500 Gross per 24 hour  Intake 620.45 ml  Output 2350 ml  Net -1729.55 ml      Assessment/Plan:  74 y.o. female is s/p:  R axillary bifemoral bypass 07/29/2024 by Dr. Lanis for BLE CLI with rest pain 8 Days Post-Op   -pt with bilateral pedal pulses as above.   -incisions look good.  Dry gauze placed in bilateral groins to wick moisture.  Continue to do so daily and as needed.   -resp distress/pna-on bipap, diuresis, abx and solumedrol x 5 doses -receiving diuresis for fluid overload -hgb stable at 8.0 this morning -DVT prophylaxis:  sq heparin  -asa/statin   Lucie Apt, PA-C [facility] [phone] 08/06/2024 6:40 AM  VASCULAR STAFF ADDENDUM: I have independently interviewed and examined the patient. I agree with the above.   In short, the patient is critical.  When  I saw her yesterday, we had a long discussion regarding pneumonia, and the sequela that can arise. Now in the ICU due to pulmonary insufficiency, she is on BiPAP.  She continues to mentate, but I am very concerned that she could continue to decline, requiring intubation.  I again had a long discussion with her and her husband regarding her overall clinical status.  From a vascular standpoint, wounds are healing nicely, however she does have some serous drainage from the right groin.  This is due to fluid overload.  I am worried that if this continues, it will be an avenue for infection.  I am happy that she is on antibiotics. My plan is to place a Prevena vacuum dressing on the right groin today and leave in place for another 7 days in an effort to wick away the moisture.  Appreciate all the help from critical care colleagues  and nursing. Will continue to follow.    Fonda FORBES Rim MD [facility] Phone Number: [phone] 08/06/2024 7:46 AM      "

## 2024-08-06 NOTE — H&P (View-Only) (Signed)
 "    Advanced Heart Failure Rounding Note  Cardiologist: Lonni Cash, MD  [facility] Cardiologist: Establishing Dr. Rolan  Patient Profile   Pam [name] is a 74 y.o. female with  history of chronic HFrEF/mixed cardiomyopathy, CAD, PAD, chronic hyponatremia, COPD.    Admitted with acute respiratory failure, acute on chronic CHF and hyponatremia.   Significant Events:    1/5: started on Milrinone  for low co-ox, 53% 1/5: moved to CCU for acute hypoxic respiratory failure requiring Bipap, ?seizure like activity. CXR w/ pulm edema. Runs of VT treated w/ ICD shocks, milrinone  d/ced  (K 3.2, Mg 1.6)   Subjective:    Now resting comfortably on BiPAP. No further VT on tele. Continues on amio gtt at 30/hr   Mg corrected, 2.2 this morning. K 3.4 (getting additional IV K runs)   Co-ox 64% off Milrinone .   Lactic acid cleared, 5.7>>2.1>>1.1  Diuresing well w/ Lasix  gtt. CVP reading low but remains well above admit wt.   On empiric abx per primary team for possible PNA. Respiratory panel pending.    Objective:   Weight Range: 55.7 kg Body mass index is 21.75 kg/m.   Vital Signs:   Temp:  [97.1 F (36.2 C)-98 F (36.7 C)] 97.2 F (36.2 C) (01/05 2203) Pulse Rate:  [80-136] 80 (01/06 0300) Resp:  [12-34] 12 (01/06 0400) BP: (117-165)/(48-88) 130/50 (01/06 0400) SpO2:  [79 %-100 %] 91 % (01/06 0300) FiO2 (%):  [36 %-100 %] 80 % (01/06 0218) Weight:  [55.7 kg] 55.7 kg (01/06 0448) Last BM Date : 08/06/24  Weight change: Filed Weights   08/06/24 0000 08/06/24 0448  Weight: 55.7 kg 55.7 kg    Intake/Output:   Intake/Output Summary (Last 24 hours) at 08/06/2024 0713 Last data filed at 08/06/2024 0500 Gross per 24 hour  Intake 620.45 ml  Output 2350 ml  Net -1729.55 ml     Physical Exam   General:  frail, chronically ill appearing F on Bipap.   Cor: RRR. No MRGs. JVD not well visualized.  Lungs: diminished at the bases bilaterally, diffuse expiratory wheezing   Extremities: warm and dry. No LEE   Telemetry   NSR 80s-90s, no further VT, personally reviewed   Labs   CBC Recent Labs    08/06/24 0501 08/06/24 0622  WBC 9.1 9.9  NEUTROABS 7.8*  --   HGB 7.8* 8.0*  HCT 22.2* 22.7*  MCV 89.9 89.7  PLT 374 392   Basic Metabolic Panel Recent Labs    98/94/73 2103 08/05/24 2242 08/06/24 0200 08/06/24 0500  NA 121* 121* 122*  --   K 5.2* 3.9 3.4*  --   CL 83*  --  84*  --   CO2 23  --  26  --   GLUCOSE 281*  --  191*  --   BUN 23  --  23  --   CREATININE 1.20*  --  0.94  --   CALCIUM  8.7*  --  8.9  --   MG 2.8*  --  2.3 2.2  PHOS  --   --  3.0  --    Liver Function Tests Recent Labs    08/04/24 0500  AST 34  ALT 17  ALKPHOS 64  BILITOT 0.4  PROT 5.5*  ALBUMIN  3.4*   No results for input(s): LIPASE, AMYLASE in the last 72 hours. Cardiac Enzymes No results for input(s): CKTOTAL, CKMB, CKMBINDEX, TROPONINI in the last 72 hours.  BNP: BNP (last 3 results) Recent Labs  11/22/23 1129 03/26/24 1423 05/29/24 1206  BNP 472.0* 614.1* 841.1*    ProBNP (last 3 results) Recent Labs    07/24/24 1016 08/02/24 1551  PROBNP 4,537.0* 5,039.0*     D-Dimer No results for input(s): DDIMER in the last 72 hours. Hemoglobin A1C No results for input(s): HGBA1C in the last 72 hours. Fasting Lipid Panel No results for input(s): CHOL, HDL, LDLCALC, TRIG, CHOLHDL, LDLDIRECT in the last 72 hours. Medications:   Scheduled Medications:  acetylcysteine   4 mL Nebulization TID   amiodarone   150 mg Intravenous STAT   aspirin  EC  81 mg Oral Q0600   atorvastatin   80 mg Oral Daily   Chlorhexidine  Gluconate Cloth  6 each Topical Daily   collagenase    Topical Daily   feeding supplement  237 mL Oral TID WC   heparin   5,000 Units Subcutaneous Q8H   insulin  aspart  0-15 Units Subcutaneous Q4H   ipratropium-albuterol   3 mL Nebulization Q4H   levothyroxine   37.5 mcg Oral Q0600   methylPREDNISolone   (SOLU-MEDROL ) injection  40 mg Intravenous Q12H   ranolazine   1,000 mg Oral BID   sodium chloride  flush  10-40 mL Intracatheter Q12H   sodium chloride  flush  3 mL Intravenous Q12H   sodium chloride  HYPERTONIC  4 mL Nebulization BID   umeclidinium-vilanterol  1 puff Inhalation Daily   valACYclovir   1,000 mg Oral Daily    Infusions:  amiodarone  30 mg/hr (08/06/24 0500)   amiodarone      furosemide  (LASIX ) 200 mg in dextrose  5 % 100 mL (2 mg/mL) infusion 10 mg/hr (08/06/24 0500)   piperacillin -tazobactam (ZOSYN )  IV 3.375 g (08/06/24 9372)   potassium chloride  10 mEq (08/06/24 0626)    PRN Medications: acetaminophen , amiodarone , calcium  carbonate, guaiFENesin -dextromethorphan, HYDROmorphone  (DILAUDID ) injection, ondansetron  (ZOFRAN ) IV, oxyCODONE -acetaminophen , phenol, polyethylene glycol, senna, sodium chloride  flush, sodium chloride  flush  Assessment/Plan   1. Acute hypoxemic respiratory failure: Patient is a prior smoker with h/o COPD but has not been on home oxygen, but requiring supplemental O2 this admission. Now on Bipap for worsening hypoxia  - 2/2 combination of acute on chronic CHF and COPD exacerbation +/- possible PNA. Viral respiratory panel also pending - Will continue to diurese w/ lasix  gtt, see below. If not improving over next couple of days may need RHC. - Empiric abx per primary - Schedule nebs, start steroids (discussed with [facility])  2. Acute on chronic systolic CHF: Ischemic cardiomyopathy, has MDT ICD.  Echo this admission with EF 30-35%, WMAs, normal RV, IVC not dilated.   - Started on milrinone  1/5 to facilitate diuresis. Now off w/ VT. Co-ox stable today off inotrope, 64% - CXR this am w/ pulmonary edema, continue Lasix  gtt at 10/hr + aggressive K supp  - Start spiro 25 mg daily   3. CAD: NSTEMI 12/17, CTO LAD with collaterals and severe pLCx/OM1 stenosis on cath.  She had PCI to pLCx and OM1.  No chest pain. Hs trop 210>>209, likely d/t demand ishemia  - Continue  ASA 81/Plavix  - Continue statin.  - Continue ranolazine . Monitor QTc on tele/EKGs   4. Hyponatremia: This appears chronic, Na in upper 120s -130 range generally, has been seen by endocrinology in the past.  Concern for possible SIADH +/- component d/t hypervolemia - Received tolvaptan  1/2 - Na 124 today, (corrected), ? Tolvaptan     5. PAD: S/p R sided axillobifemoral bypass using PTFE and R CFA endarterectomy this admission.  - ASA/Plavix  - Statin  6. Post-op anemia - Hgb 8.0 today -  also ? Component of IDA, will check iron studies  7. Hypokalemia - K 3.4 - Supp aggressively - Add spiro 25  8. VT - multiple runs this admit, treated w/ ICD shocks - in setting of low Mg and K + Milrinone  - continue amio gtt at 30/hr  - keep off Milrinone  - continue aggressive lyte supp. Keep K > 4.0, Mg > 2.0  - monitor QTc w/ Ranexa    CRITICAL CARE Performed by: Caffie Shed   Total critical care time: 15 minutes  Critical care time was exclusive of separately billable procedures and treating other patients.  Critical care was necessary to treat or prevent imminent or life-threatening deterioration.  Critical care was time spent personally by me on the following activities: development of treatment plan with patient and/or surrogate as well as nursing, discussions with consultants, evaluation of patient's response to treatment, examination of patient, obtaining history from patient or surrogate, ordering and performing treatments and interventions, ordering and review of laboratory studies, ordering and review of radiographic studies, pulse oximetry and re-evaluation of patient's condition.    Length of Stay: [id number] PA-C  08/06/2024, 7:13 AM  Advanced Heart Failure Team Pager [phone] (M-F; 7a - 5p)   Please visit [email or web address]: For overnight coverage please call cardiology fellow first. If fellow not available call Shock/ECMO MD on call.  For ECMO / Mechanical  Support (Impella, IABP, LVAD) issues call Shock / ECMO MD on call.    Patient seen with PA, I formulated the plan and agree with the above note.   She is on Bipap this morning.  Lactate down to 1.1.  Co-ox 64% off milrinone . BP stable off midodrine . On Lasix  gtt with I/Os net negative 2277, CVP 7-8 on my read.   CXR shows bilateral lower lobe airspace disease.  Now on Zosyn .   General: On Bipap Neck: JVP 7-8 cm, no thyromegaly or thyroid  nodule.  Lungs: Occasional rhonchi.  CV: Nondisplaced PMI.  Heart regular S1/S2, no S3/S4, no murmur.  No peripheral edema.   Abdomen: Soft, nontender, no hepatosplenomegaly, no distention.  Skin: Intact without lesions or rashes.  Neurologic: Alert and oriented x 3.  Psych: Normal affect. Extremities: No clubbing or cyanosis.  HEENT: Normal.   1. Acute hypoxemic respiratory failure: Patient is a prior smoker with h/o COPD but has not been on home oxygen.  Patient is now on Bipap.  CXR with bilateral lower lobe infiltrates, but procalcitonin has not been elevated.  Initial respiratory virus panel negative.  Concern for COPD exacerbation, ?viral infection.  Volume overload also thought to contribute.  - Continue Lasix  as below.   - Resent respiratory virus panel.  - Empiric Zosyn .  - On Solumedrol 40 mg IV bid for COPD exacerbation.  2. Acute on chronic systolic CHF: Ischemic cardiomyopathy, has MDT ICD.  Echo this admission with EF 30-35%, WMAs, normal RV, IVC not dilated.  Exam is difficult for volume but increased oxygen requirement and on Bipap.  Was started on milrinone  with low co-ox but this was stopped due to VT.  Good diuresis over the last day but still on Bipap.  CVP 7-8 on my read.  Co-ox 64% this morning off milrinone . Now off midodrine .  - Continue Lasix  gtt 10 mg/hr today.    - Spironolactone  25 mg daily.  - Would aim for RHC tomorrow to better understand her hemodynamics. Discussed risks/benefits with patient and husband, they agree to  procedure.   3. CAD: NSTEMI 12/17, CTO LAD  with collaterals and severe pLCx/OM1 stenosis on cath.  She had PCI to pLCx and OM1.  No chest pain.  - Continue ASA 81 - Continue statin.  - Continue ranolazine  4. Hyponatremia: This appears chronic, Na in upper 120s -130 range generally, has been seen by endocrinology in the past.  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  ?Baseline SIADH with additional component of hypervolemic hyponatremia this admission vs worsening SIADH due to COPD exacerbation/lung infection.  - Fluid restrict.   - Continue diuresis.  - Will give tolvaptan  15 mg x 1.  5. PAD: S/p R sided axillobifemoral bypass using PTFE and R CFA endarterectomy this admission.  - ASA - Statin - [facility] following.  6. Post-op anemia - Check Fe studies.  7. VT: Multiple runs with ICD shocks in setting of low K and Mg and milrinone  initiation.  Now off milrinone .  - Continue amiodarone  gtt 30 mg/hr.   CRITICAL CARE Performed by: Ezra Shuck  Total critical care time: 45 minutes  Critical care time was exclusive of separately billable procedures and treating other patients.  Critical care was necessary to treat or prevent imminent or life-threatening deterioration.  Critical care was time spent personally by me on the following activities: development of treatment plan with patient and/or surrogate as well as nursing, discussions with consultants, evaluation of patient's response to treatment, examination of patient, obtaining history from patient or surrogate, ordering and performing treatments and interventions, ordering and review of laboratory studies, ordering and review of radiographic studies, pulse oximetry and re-evaluation of patient's condition.  Ezra Shuck 08/06/2024 9:19 AM  "

## 2024-08-07 ENCOUNTER — Encounter (HOSPITAL_COMMUNITY): Payer: Self-pay | Admitting: Cardiology

## 2024-08-07 ENCOUNTER — Inpatient Hospital Stay (HOSPITAL_COMMUNITY)

## 2024-08-07 ENCOUNTER — Encounter (HOSPITAL_COMMUNITY)
Admission: EM | Disposition: A | Discharge status: Hospice | Payer: Self-pay | Source: Ambulatory Visit | Attending: Internal Medicine

## 2024-08-07 ENCOUNTER — Ambulatory Visit (HOSPITAL_COMMUNITY): Admit: 2024-08-07 | Admitting: Cardiology

## 2024-08-07 DIAGNOSIS — I5023 Acute on chronic systolic (congestive) heart failure: Secondary | ICD-10-CM | POA: Diagnosis not present

## 2024-08-07 DIAGNOSIS — J449 Chronic obstructive pulmonary disease, unspecified: Secondary | ICD-10-CM | POA: Diagnosis not present

## 2024-08-07 DIAGNOSIS — J90 Pleural effusion, not elsewhere classified: Secondary | ICD-10-CM | POA: Diagnosis not present

## 2024-08-07 DIAGNOSIS — J9601 Acute respiratory failure with hypoxia: Secondary | ICD-10-CM | POA: Diagnosis not present

## 2024-08-07 DIAGNOSIS — J810 Acute pulmonary edema: Secondary | ICD-10-CM | POA: Diagnosis not present

## 2024-08-07 HISTORY — PX: RIGHT HEART CATH: CATH118263

## 2024-08-07 LAB — GLUCOSE, CAPILLARY
Glucose-Capillary: 207 mg/dL — ABNORMAL HIGH (ref 70–99)
Glucose-Capillary: 81 mg/dL (ref 70–99)
Glucose-Capillary: 131 mg/dL — ABNORMAL HIGH (ref 70–99)
Glucose-Capillary: 144 mg/dL — ABNORMAL HIGH (ref 70–99)
Glucose-Capillary: 95 mg/dL (ref 70–99)

## 2024-08-07 LAB — CBC
HCT: 20 % — ABNORMAL LOW (ref 36.0–46.0)
HCT: 25.7 % — ABNORMAL LOW (ref 36.0–46.0)
Hemoglobin: 6.9 g/dL — CL (ref 12.0–15.0)
Hemoglobin: 8.6 g/dL — ABNORMAL LOW (ref 12.0–15.0)
MCH: 31.8 pg (ref 26.0–34.0)
MCH: 31.2 pg (ref 26.0–34.0)
MCHC: 34.5 g/dL (ref 30.0–36.0)
MCHC: 33.5 g/dL (ref 30.0–36.0)
MCV: 92.2 fL (ref 80.0–100.0)
MCV: 93.1 fL (ref 80.0–100.0)
Platelets: 337 K/uL (ref 150–400)
Platelets: 351 K/uL (ref 150–400)
RBC: 2.17 MIL/uL — ABNORMAL LOW (ref 3.87–5.11)
RBC: 2.76 MIL/uL — ABNORMAL LOW (ref 3.87–5.11)
RDW: 15.3 % (ref 11.5–15.5)
RDW: 15.2 % (ref 11.5–15.5)
WBC: 9.9 K/uL (ref 4.0–10.5)
WBC: 12.7 K/uL — ABNORMAL HIGH (ref 4.0–10.5)
nRBC: 0.2 % (ref 0.0–0.2)
nRBC: 0.6 % — ABNORMAL HIGH (ref 0.0–0.2)

## 2024-08-07 LAB — POCT I-STAT EG7
Acid-Base Excess: 6 mmol/L — ABNORMAL HIGH (ref 0.0–2.0)
Acid-Base Excess: 6 mmol/L — ABNORMAL HIGH (ref 0.0–2.0)
Acid-Base Excess: 7 mmol/L — ABNORMAL HIGH (ref 0.0–2.0)
Bicarbonate: 30.1 mmol/L — ABNORMAL HIGH (ref 20.0–28.0)
Bicarbonate: 30.2 mmol/L — ABNORMAL HIGH (ref 20.0–28.0)
Bicarbonate: 31.2 mmol/L — ABNORMAL HIGH (ref 20.0–28.0)
Calcium, Ion: 0.99 mmol/L — ABNORMAL LOW (ref 1.15–1.40)
Calcium, Ion: 1.01 mmol/L — ABNORMAL LOW (ref 1.15–1.40)
Calcium, Ion: 1.01 mmol/L — ABNORMAL LOW (ref 1.15–1.40)
HCT: 22 % — ABNORMAL LOW (ref 36.0–46.0)
HCT: 23 % — ABNORMAL LOW (ref 36.0–46.0)
HCT: 24 % — ABNORMAL LOW (ref 36.0–46.0)
Hemoglobin: 7.5 g/dL — ABNORMAL LOW (ref 12.0–15.0)
Hemoglobin: 7.8 g/dL — ABNORMAL LOW (ref 12.0–15.0)
Hemoglobin: 8.2 g/dL — ABNORMAL LOW (ref 12.0–15.0)
O2 Saturation: 37 %
O2 Saturation: 45 %
O2 Saturation: 46 %
Potassium: 3.2 mmol/L — ABNORMAL LOW (ref 3.5–5.1)
Potassium: 3.2 mmol/L — ABNORMAL LOW (ref 3.5–5.1)
Potassium: 3.2 mmol/L — ABNORMAL LOW (ref 3.5–5.1)
Sodium: 131 mmol/L — ABNORMAL LOW (ref 135–145)
Sodium: 131 mmol/L — ABNORMAL LOW (ref 135–145)
Sodium: 131 mmol/L — ABNORMAL LOW (ref 135–145)
TCO2: 31 mmol/L (ref 22–32)
TCO2: 31 mmol/L (ref 22–32)
TCO2: 32 mmol/L (ref 22–32)
pCO2, Ven: 40.5 mmHg — ABNORMAL LOW (ref 44–60)
pCO2, Ven: 42.5 mmHg — ABNORMAL LOW (ref 44–60)
pCO2, Ven: 43 mmHg — ABNORMAL LOW (ref 44–60)
pH, Ven: 7.459 — ABNORMAL HIGH (ref 7.25–7.43)
pH, Ven: 7.468 — ABNORMAL HIGH (ref 7.25–7.43)
pH, Ven: 7.479 — ABNORMAL HIGH (ref 7.25–7.43)
pO2, Ven: 21 mmHg — CL (ref 32–45)
pO2, Ven: 23 mmHg — CL (ref 32–45)
pO2, Ven: 24 mmHg — CL (ref 32–45)

## 2024-08-07 LAB — BASIC METABOLIC PANEL WITH GFR
Anion gap: 10 (ref 5–15)
Anion gap: 11 (ref 5–15)
Anion gap: 11 (ref 5–15)
Anion gap: 12 (ref 5–15)
BUN: 21 mg/dL (ref 8–23)
BUN: 21 mg/dL (ref 8–23)
BUN: 26 mg/dL — ABNORMAL HIGH (ref 8–23)
BUN: 30 mg/dL — ABNORMAL HIGH (ref 8–23)
CO2: 29 mmol/L (ref 22–32)
CO2: 29 mmol/L (ref 22–32)
CO2: 30 mmol/L (ref 22–32)
CO2: 31 mmol/L (ref 22–32)
Calcium: 7.6 mg/dL — ABNORMAL LOW (ref 8.9–10.3)
Calcium: 7.8 mg/dL — ABNORMAL LOW (ref 8.9–10.3)
Calcium: 7.8 mg/dL — ABNORMAL LOW (ref 8.9–10.3)
Calcium: 8.6 mg/dL — ABNORMAL LOW (ref 8.9–10.3)
Chloride: 88 mmol/L — ABNORMAL LOW (ref 98–111)
Chloride: 89 mmol/L — ABNORMAL LOW (ref 98–111)
Chloride: 90 mmol/L — ABNORMAL LOW (ref 98–111)
Chloride: 90 mmol/L — ABNORMAL LOW (ref 98–111)
Creatinine, Ser: 0.97 mg/dL (ref 0.44–1.00)
Creatinine, Ser: 1 mg/dL (ref 0.44–1.00)
Creatinine, Ser: 1.05 mg/dL — ABNORMAL HIGH (ref 0.44–1.00)
Creatinine, Ser: 1.1 mg/dL — ABNORMAL HIGH (ref 0.44–1.00)
GFR, Estimated: 53 mL/min — ABNORMAL LOW
GFR, Estimated: 56 mL/min — ABNORMAL LOW
GFR, Estimated: 59 mL/min — ABNORMAL LOW
GFR, Estimated: >60 mL/min
Glucose, Bld: 157 mg/dL — ABNORMAL HIGH (ref 70–99)
Glucose, Bld: 160 mg/dL — ABNORMAL HIGH (ref 70–99)
Glucose, Bld: 186 mg/dL — ABNORMAL HIGH (ref 70–99)
Glucose, Bld: 90 mg/dL (ref 70–99)
Potassium: 3.1 mmol/L — ABNORMAL LOW (ref 3.5–5.1)
Potassium: 3.7 mmol/L (ref 3.5–5.1)
Potassium: 3.9 mmol/L (ref 3.5–5.1)
Potassium: 4 mmol/L (ref 3.5–5.1)
Sodium: 129 mmol/L — ABNORMAL LOW (ref 135–145)
Sodium: 130 mmol/L — ABNORMAL LOW (ref 135–145)
Sodium: 131 mmol/L — ABNORMAL LOW (ref 135–145)
Sodium: 132 mmol/L — ABNORMAL LOW (ref 135–145)

## 2024-08-07 LAB — COOXEMETRY PANEL
Carboxyhemoglobin: 2.6 % — ABNORMAL HIGH (ref 0.5–1.5)
Methemoglobin: <0.7 % (ref 0.0–1.5)
O2 Saturation: 61.5 %
Total hemoglobin: <6.9 g/dL — CL (ref 12.0–16.0)

## 2024-08-07 LAB — MAGNESIUM: Magnesium: 2.2 mg/dL (ref 1.7–2.4)

## 2024-08-07 LAB — SURGICAL PCR SCREEN
MRSA, PCR: NEGATIVE
Staphylococcus aureus: NEGATIVE

## 2024-08-07 LAB — PREPARE RBC (CROSSMATCH)

## 2024-08-07 MED ORDER — SODIUM CHLORIDE 0.9% IV SOLUTION: Freq: Once | INTRAVENOUS | Status: AC

## 2024-08-07 MED ORDER — LIDOCAINE HCL (PF) 1 % IJ SOLN
INTRAMUSCULAR | Status: AC
  Filled 2024-08-07: qty 30

## 2024-08-07 MED ORDER — AMIODARONE HCL 200 MG PO TABS
200.0000 mg | ORAL_TABLET | Freq: Two times a day (BID) | ORAL | Status: DC
  Administered 2024-08-07 – 2024-08-13 (×13): 200 mg via ORAL
  Filled 2024-08-07 (×13): qty 1

## 2024-08-07 MED ORDER — ENSURE PLUS HIGH PROTEIN PO LIQD
237.0000 mL | Freq: Two times a day (BID) | ORAL | Status: DC
  Administered 2024-08-07 – 2024-08-13 (×13): 237 mL via ORAL
  Filled 2024-08-07 (×2): qty 237

## 2024-08-07 MED ORDER — POTASSIUM CHLORIDE CRYS ER 20 MEQ PO TBCR
40.0000 meq | EXTENDED_RELEASE_TABLET | Freq: Once | ORAL | Status: AC
  Administered 2024-08-07: 40 meq via ORAL
  Filled 2024-08-07: qty 2

## 2024-08-07 MED ORDER — POTASSIUM CHLORIDE 20 MEQ PO PACK: 40.0000 meq | PACK | Freq: Once | ORAL | Status: DC

## 2024-08-07 MED ORDER — POTASSIUM CHLORIDE CRYS ER 20 MEQ PO TBCR: 20.0000 meq | EXTENDED_RELEASE_TABLET | Freq: Once | ORAL | Status: DC

## 2024-08-07 MED ORDER — ALBUTEROL SULFATE (2.5 MG/3ML) 0.083% IN NEBU
2.5000 mg | INHALATION_SOLUTION | RESPIRATORY_TRACT | Status: AC | PRN
  Administered 2024-08-07 – 2024-08-13 (×2): 2.5 mg via RESPIRATORY_TRACT
  Filled 2024-08-07 (×2): qty 3

## 2024-08-07 MED ORDER — POTASSIUM CHLORIDE CRYS ER 20 MEQ PO TBCR: 40.0000 meq | EXTENDED_RELEASE_TABLET | Freq: Once | ORAL | Status: DC

## 2024-08-07 MED ORDER — POTASSIUM CHLORIDE 20 MEQ PO PACK: 40.0000 meq | PACK | ORAL | Status: DC

## 2024-08-07 MED ORDER — MIRTAZAPINE 15 MG PO TABS
7.5000 mg | ORAL_TABLET | Freq: Every day | ORAL | Status: DC
  Administered 2024-08-07 – 2024-08-12 (×6): 7.5 mg via ORAL
  Filled 2024-08-07 (×7): qty 1

## 2024-08-07 MED ORDER — LIDOCAINE HCL (PF) 1 % IJ SOLN
INTRAMUSCULAR | Status: DC | PRN
  Administered 2024-08-07: 2 mL

## 2024-08-07 MED ORDER — HEPARIN (PORCINE) IN NACL 1000-0.9 UT/500ML-% IV SOLN
INTRAVENOUS | Status: DC | PRN
  Administered 2024-08-07: 500 mL

## 2024-08-07 NOTE — Progress Notes (Signed)
 Patient in no distress at this time, bipap not needed.

## 2024-08-07 NOTE — Progress Notes (Signed)
 "  NAME:  Pam [name], MRN:  [id number], DOB:  [birth year], LOS: 14 ADMISSION DATE:  07/24/2024,  CHIEF COMPLAINT: Acute hypercapnic respiratory failure  History of Present Illness:  74 year old female w/ h/o HFrEF, has ICD, HTN, PAD, HLD, came in initially on 12/24 w/ right sided leg pain and ischemia. Went to OR for right axillobifemoral bypass and fight common femoral endartectomy on 12/29.    Course has been c/b post op hypotension; placed on midodrine , volume overload w/ no sig response to IV diuresis. Hyponatremia (acute on chronic) baseline mid 120s, got tolvaptan  on 1/2.  ADV HF consulted on 1/2. PICC placed (CVP ~11) wt still up, diuretics adjusted. On 1/4 still reporting increased SOB. CXR raising concern for PNA so abx started. Na down to 124. 1/5 seen again by adv HF. Wheezing, steroids and BDs added. Milrinone  started. Lasix  up to 80mg /d, Spiro added. Na 128->125   On 1/5 evening hrs had 4 episodes of VT triggering ICD shocks. Administered additional amiodarone  bolus. Milrinone  stopped. K 3.2 , Mg 1.6. has chronic LAD. W/ mild ST elevation. During events there was change in mental status with concern for possible seizure however this was felt primarily to be syncopal event associated with the VT.   On 1/5 she was transferred to the intensive care for progressive respiratory failure and hemodynamic instability including recurrent VT    Pertinent  Medical History   Past Medical History:  Diagnosis Date   Abnormal EKG    AICD (automatic cardioverter/defibrillator) present    [employer]   Alcoholic intoxication without complication    Allergy    Cardiomyopathy, ischemic    Cataract    Chest pain 08/29/2016   Chest pain 08/29/2016   CHF (congestive heart failure) (HCC)    Chronic systolic heart failure (HCC) 08/29/2016   Coronary artery disease    Facial laceration    Fall 07/07/2016   Hyperlipidemia    Hypertension 03/03/2017   Hyponatremia    Hypothyroidism 07/08/2016    NSTEMI (non-ST elevated myocardial infarction) [facility])    Syncope    Thyroid  disease      Significant Hospital Events: Including procedures, antibiotic start and stop dates in addition to other pertinent events   12/24 admitted w/ critical limb ischemia 12/29 right axillobifemoral bypass and fight common femoral endartectomy   1/2.  ADV HF consulted on 1/2. PICC placed (CVP ~11) wt still up, diuretics adjusted.  1/4 still reporting increased SOB. CXR raising concern for PNA so abx started. Na down to 124.  evening hrs had 4 episodes of VT triggering ICD shocks. Administered additional amiodarone  bolus.  08/05/2024 - Moved to ICU on BiPaP for ARF. 08/06/2024 - S/P RHC. Stable to be transfered out of ICU  Interim History / Subjective:  Patient diuresed very well.  She is 9 L negative since admission.  She continues to be on Lasix  drip at 10 mg/h.  Her CT abdomen pelvis showed anasarca but no acute cause of the bleeding.  Hemoglobin today was 6.9 patient got 1 unit of blood and hemoglobin later today is 8.6.  Right heart cath today showed slightly elevated right heart pressures with a wedge of 15.  CVP remains low.  Sodium has corrected post tolvaptan .  She is much more alert this morning.  Objective    Blood pressure (!) 115/44, pulse 90, temperature 99.5 F (37.5 C), resp. rate 19, height 5' 3 (1.6 m), weight 55 kg, SpO2 98%. CVP:  [5 mmHg-77 mmHg] 6 mmHg  FiO2 (%):  [40 %] 40 % PEEP:  [5 cmH20] 5 cmH20 Pressure Support:  [5 cmH20] 5 cmH20   Intake/Output Summary (Last 24 hours) at 08/07/2024 1415 Last data filed at 08/07/2024 1410 Gross per 24 hour  Intake 968.17 ml  Output 3108 ml  Net -2139.83 ml   Filed Weights   08/06/24 0448 08/06/24 1810 08/07/24 0456  Weight: 55.7 kg 54.3 kg 55 kg    Examination: Physical exam: General: Alert and oriented no distress.  Lying on the bed HEENT: [place]/AT, eyes anicteric.  moist mucus membranes Neuro: Alert, awake following commands Chest: Coarse  breath sounds, no wheezes or rhonchi Heart: Regular rate and rhythm, no murmurs or gallops Abdomen: Soft, nontender, nondistended, bowel sounds present     Resolved problem list   Assessment and Plan   Acute hypoxic respiratory failure secondary to diffuse pulmonary infiltrates consistent with Worsening pulmonary edema, complicated further by small bilateral pleural effusions, +/- pneumonia -improving  Plan Patient is off NIV and high flow.  Currently on 3 L nasal cannula. On Lasix  drip at 10 mg/h. Appreciate cardiology input.  Right heart cath today results noted. Will continue scheduled bronchodilators Continuous pulse oximetry Diet is allowed now. Currently day #4/7 Zosyn , MRSA PCR was -24 hours again so no need to start vancomycin Stop IV steroids   Acute on chronic systolic heart failure.  Baseline EF 30 to 35%, current EF on 1 1 remains the same at 130 to 35%.  There is left ventricular regional wall abnormalities RV size moderately enlarged with moderately enlarged pulmonary artery systolic pressures Plan Continue telemetry monitoring Transduce central venous pressure Aggressive diuresis Inotrope he placed on hold due to recurrent VT Ensure mean arterial pressure greater than 65 SCVO2 is 61.5 Iron level is low infusions ordered by cardiology team.  Acute on Chronic Anemia etiology unclear No obvious bleeding Monitor Hb. CT abdomen pelvis did not show any acute cause of bleed hemoglobin improved after 1 unit blood transfusion.   History of known coronary artery disease with chronically occluded LAD.  Currently with ST elevation in V1 and V2, with elevated troponin.  Troponin is flat.  Likely demand ischemia.   Plan Continue telemetry monitoring Cardiac enzymes stayed stable.   VT.  Status post ICD shock x 4 Plan amiodarone  infusion changed to p.o. Ensure potassium greater than 4 and magnesium  greater than 2 Telemetry monitoring Milrinone  stopped per cardiology  given concern about possible precipitating VT   Lactic acidosis. -resolved   Brief change in mental status-delirium Plan CT head shows nothing acute.  EEG shows no seizures.   Acute on chronic hyponatremia.  Suspect secondary to heart failure. This has improved post tolvaptan  administration. Plan Continuing diuresis Serial chemistries Strict intake output Status post tolvaptan  on 08/06/2024.   Fluid and electrolyte imbalance: Hypokalemia, hypochloremia, Hypomagnesemia. Plan Aggressive replacement given Lasix  drip   Hypothyroidism Plan Continuing Synthroid    Critical limb ischemia status post right sided axillobifemoral bypass with right common femoral endarterectomy on 12/29 Mild bleeding for heparin  shot from left arm Plan Continuing Plavix  and aspirin  as well as statin   Anemia.  Without evidence of bleeding. Plan Trend CBC   Hyperglycemia. Capillary blood glucose greater than 200 currently.  Suspect steroid related Plan  sliding scale insulin     Discussed with cardiology okay to transfer the patient out.  TRH will be signed out to pick up from 08/08/2024.  Labs   CBC: Recent Labs  Lab 08/06/24 0501 08/06/24 0622 08/06/24 1143 08/06/24  1640 08/07/24 0158 08/07/24 1245  WBC 9.1 9.9 10.8* 11.1* 9.9 12.7*  NEUTROABS 7.8*  --  9.3*  --   --   --   HGB 7.8* 8.0* 7.5* 7.4* 6.9* 8.6*  HCT 22.2* 22.7* 21.7* 22.1* 20.0* 25.7*  MCV 89.9 89.7 91.2 92.9 92.2 93.1  PLT 374 392 389 414* 337 351    Basic Metabolic Panel: Recent Labs  Lab 08/05/24 2103 08/05/24 2242 08/06/24 0200 08/06/24 0500 08/06/24 1143 08/06/24 1640 08/06/24 2331 08/07/24 0158 08/07/24 1245  NA 121*   < > 122*  --  125* 126* 130* 131* 132*  K 5.2*   < > 3.4*  --  4.1 3.6 4.0 3.7 3.1*  CL 83*  --  84*  --  86* 87* 89* 90* 90*  CO2 23  --  26  --  28 27 31 29 30   GLUCOSE 281*  --  191*  --  202* 213* 157* 160* 186*  BUN 23  --  23  --  20 19 21 21  26*  CREATININE 1.20*  --  0.94  --  0.91  0.95 0.97 1.00 1.05*  CALCIUM  8.7*  --  8.9  --  7.9* 8.0* 7.8* 7.6* 7.8*  MG 2.8*  --  2.3 2.2  --  2.3  --  2.2  --   PHOS  --   --  3.0  --   --   --   --   --   --    < > = values in this interval not displayed.   GFR: Estimated Creatinine Clearance: 39.5 mL/min (A) (by C-G formula based on SCr of 1.05 mg/dL (H)). Recent Labs  Lab 08/02/24 1247 08/03/24 0406 08/05/24 2103 08/06/24 0133 08/06/24 0343 08/06/24 0501 08/06/24 1143 08/06/24 1640 08/07/24 0158 08/07/24 1245  PROCALCITON <0.10  --   --   --   --   --   --   --   --   --   WBC  --    < > 10.3  --   --    < > 10.8* 11.1* 9.9 12.7*  LATICACIDVEN  --   --  5.7* 2.1* 1.1  --   --   --   --   --    < > = values in this interval not displayed.    Liver Function Tests: Recent Labs  Lab 08/01/24 0817 08/03/24 0406 08/04/24 0500 08/06/24 1143  AST 29 28 34 36  ALT 13 13 17 21   ALKPHOS 59 57 64 54  BILITOT 0.4 0.4 0.4 0.4  PROT 5.4* 5.1* 5.5* 5.3*  ALBUMIN  3.3* 3.1* 3.4* 3.2*   No results for input(s): LIPASE, AMYLASE in the last 168 hours. No results for input(s): AMMONIA in the last 168 hours.  ABG    Component Value Date/Time   PHART 7.430 08/05/2024 2242   PCO2ART 35.1 08/05/2024 2242   PO2ART 132 (H) 08/05/2024 2242   HCO3 23.5 08/05/2024 2242   TCO2 25 08/05/2024 2242   ACIDBASEDEF 1.0 08/05/2024 2242   O2SAT 61.5 08/07/2024 0447     Coagulation Profile: Recent Labs  Lab 08/06/24 1640  INR 1.1    Cardiac Enzymes: No results for input(s): CKTOTAL, CKMB, CKMBINDEX, TROPONINI in the last 168 hours.  HbA1C: Hgb A1c MFr Bld  Date/Time Value Ref Range Status  05/30/2024 08:51 AM 5.4 <5.7 % Final    Comment:    For the purpose of screening for the presence of  diabetes: . <5.7%       Consistent with the absence of diabetes 5.7-6.4%    Consistent with increased risk for diabetes             (prediabetes) > or =6.5%  Consistent with diabetes . This assay result is consistent  with a decreased risk of diabetes. . Currently, no consensus exists regarding use of hemoglobin A1c for diagnosis of diabetes in children. . According to American Diabetes Association (ADA) guidelines, hemoglobin A1c <7.0% represents optimal control in non-pregnant diabetic patients. Different metrics may apply to specific patient populations.  Standards of Medical Care in Diabetes(ADA). .   07/09/2016 04:28 AM 5.3 4.8 - 5.6 % Final    Comment:    (NOTE)         Pre-diabetes: 5.7 - 6.4         Diabetes: >6.4         Glycemic control for adults with diabetes: <7.0     CBG: Recent Labs  Lab 08/06/24 1953 08/06/24 2339 08/07/24 0437 08/07/24 0724 08/07/24 1133  GLUCAP 121* 175* 207* 81 131*    Review of Systems:   12 point review of systems done which is negative for anything else other than what is mentioned in HPI.  Past Medical History:  She,  has a past medical history of Abnormal EKG, AICD (automatic cardioverter/defibrillator) present, Alcoholic intoxication without complication, Allergy, Cardiomyopathy, ischemic, Cataract, Chest pain (08/29/2016), Chest pain (08/29/2016), CHF (congestive heart failure) (HCC), Chronic systolic heart failure (HCC) (98/70/7981), Coronary artery disease, Facial laceration, Fall (07/07/2016), Hyperlipidemia, Hypertension (03/03/2017), Hyponatremia, Hypothyroidism (07/08/2016), NSTEMI (non-ST elevated myocardial infarction) (HCC), Syncope, and Thyroid  disease.   Surgical History:   Past Surgical History:  Procedure Laterality Date   AXILLARY-FEMORAL BYPASS GRAFT Bilateral 07/29/2024   Procedure: CREATION, BYPASS, ARTERIAL, AXILLARY TO BILATERAL FEMORAL, USING GRAFT;  Surgeon: Lanis Fonda BRAVO, MD;  Location: [facility] OR;  Service: Vascular;  Laterality: Bilateral;   CARDIAC CATHETERIZATION N/A 07/08/2016   Procedure: Left Heart Cath and Coronary Angiography;  Surgeon: Lonni Hanson, MD;  Location: [facility] INVASIVE CV LAB;  Service: Cardiovascular;   Laterality: N/A;   CARDIAC CATHETERIZATION N/A 08/29/2016   Procedure: Coronary Atherectomy;  Surgeon: Lonni Hanson, MD;  Location: [facility] INVASIVE CV LAB;  Service: Cardiovascular;  Laterality: N/A;   CARDIAC CATHETERIZATION N/A 08/29/2016   Procedure: Coronary Stent Intervention;  Surgeon: Lonni Hanson, MD;  Location: [facility] INVASIVE CV LAB;  Service: Cardiovascular;  Laterality: N/A;   CATARACT EXTRACTION, BILATERAL     COLONOSCOPY WITH PROPOFOL  N/A 11/18/2021   Procedure: COLONOSCOPY WITH PROPOFOL ;  Surgeon: Eda Iha, MD;  Location: [facility] ENDOSCOPY;  Service: Gastroenterology;  Laterality: N/A;   CORONARY ANGIOPLASTY     coronary stents      ENDARTERECTOMY FEMORAL Bilateral 07/29/2024   Procedure: ENDARTERECTOMY, FEMORAL;  Surgeon: Lanis Fonda BRAVO, MD;  Location: [facility] OR;  Service: Vascular;  Laterality: Bilateral;   HAND SURGERY Right    Dr Alyse Gaba   ICD IMPLANT N/A 08/28/2019   Procedure: ICD IMPLANT;  Surgeon: Inocencio Soyla Lunger, MD;  Location: [facility] INVASIVE CV LAB;  Service: Cardiovascular;  Laterality: N/A;   LEFT HEART CATH AND CORONARY ANGIOGRAPHY N/A 04/29/2022   Procedure: LEFT HEART CATH AND CORONARY ANGIOGRAPHY;  Surgeon: Anner Alm ORN, MD;  Location[facility] INVASIVE CV LAB;  Service: Cardiovascular;  Laterality: N/A;   LOWER EXTREMITY ANGIOGRAPHY N/A 06/19/2024   Procedure: Lower Extremity Angiography;  Surgeon: Darron Deatrice LABOR, MD;  Location: [facility] INVASIVE CV LAB;  Service: Cardiovascular;  Laterality: N/A;   PARATHYROIDECTOMY Right 02/02/2021   Procedure: RIGHT SUPERIOR PARATHYROIDECTOMY;  Surgeon: Eletha Boas, MD;  Location: [facility] ORS;  Service: General;  Laterality: Right;   POLYPECTOMY  11/18/2021   Procedure: POLYPECTOMY;  Surgeon: Eda Iha, MD;  Location: [facility] ENDOSCOPY;  Service: Gastroenterology;;   RIGHT HEART CATH N/A 08/11/2022   Procedure: RIGHT HEART CATH;  Surgeon: Gardenia Led, DO;  Location: [facility] INVASIVE CV LAB;  Service: Cardiovascular;   Laterality: N/A;   VAGINAL HYSTERECTOMY       Social History:   reports that she quit smoking about 8 years ago. Her smoking use included cigarettes. She started smoking about 58 years ago. She has a 50 pack-year smoking history. She has never used smokeless tobacco. She reports that she does not currently use alcohol. She reports that she does not use drugs.   Family History:  Her family history includes Heart disease in her mother and sister; Kidney failure in her mother. There is no history of Colon cancer, Esophageal cancer, Rectal cancer, or Stomach cancer.   Allergies Allergies[1]   Home Medications  Prior to Admission medications  Medication Sig Start Date End Date Taking? Authorizing Provider  acetaminophen  (TYLENOL ) 500 MG tablet Take 1,000 mg by mouth every 6 (six) hours as needed for moderate pain.   Yes [provider]  albuterol  (VENTOLIN  HFA) 108 (90 Base) MCG/ACT inhaler Inhale 2 puffs into the lungs every 6 (six) hours as needed for wheezing or shortness of breath. 12/28/23  Yes Hope Almarie ORN, NP  aspirin  81 MG chewable tablet Chew 81 mg by mouth daily.   Yes [provider]  atorvastatin  (LIPITOR ) 80 MG tablet TAKE 1 TABLET(80 MG) BY MOUTH DAILY 11/27/18  Yes Verlin Lonni BIRCH, MD  bisoprolol  (ZEBETA ) 5 MG tablet Take 0.5 tablets (2.5 mg total) by mouth daily. 06/26/24  Yes Milford, Harlene HERO, FNP  CALCIUM  PO Take 1 tablet by mouth daily.   Yes [provider]  Cholecalciferol (VITAMIN D3) 50 MCG (2000 UT) TABS Take 2,000 Units by mouth daily.   Yes [provider]  clopidogrel  (PLAVIX ) 75 MG tablet TAKE 1 TABLET(75 MG) BY MOUTH DAILY WITH BREAKFAST 11/27/23  Yes Sabharwal, Aditya, DO  digoxin  (LANOXIN ) 0.125 MG tablet Take 0.5 tablets (0.0625 mg total) by mouth daily. 11/22/23  Yes Milford, Harlene HERO, FNP  furosemide  (LASIX ) 20 MG tablet TAKE 1 TABLET BY MOUTH AS NEEDED 02/27/24  Yes Sabharwal, Aditya, DO  levothyroxine   (SYNTHROID ) 25 MCG tablet Take 37.5 mcg by mouth daily before breakfast. 07/10/23  Yes Thapa, Sudan, MD  nitroGLYCERIN  (NITROSTAT ) 0.4 MG SL tablet Place 1 tablet (0.4 mg total) under the tongue every 5 (five) minutes x 3 doses as needed for chest pain. 06/14/24  Yes Verlin Lonni BIRCH, MD  nystatin cream (MYCOSTATIN) Apply 1 Application topically 2 (two) times daily as needed (rash). 05/09/22  Yes [provider]  ranolazine  (RANEXA ) 1000 MG SR tablet TAKE 1 TABLET(1000 MG) BY MOUTH TWICE DAILY 02/09/24  Yes Milford, [place], FNP  sacubitril -valsartan  (ENTRESTO ) 49-51 MG Take 1 tablet by mouth 2 (two) times daily. 05/01/24  Yes Clegg, Amy D, NP  spironolactone  (ALDACTONE ) 25 MG tablet TAKE 1 TABLET(25 MG) BY MOUTH DAILY 07/03/23  Yes Sabharwal, Aditya, DO  umeclidinium-vilanterol (ANORO ELLIPTA ) 62.5-25 MCG/ACT AEPB Inhale 1 puff into the lungs daily. 12/28/23  Yes Hope Almarie ORN, NP  valACYclovir  (VALTREX ) 1000 MG tablet Take 1,000 mg by mouth daily.   Yes [provider]     Critical care time: 40 minutes    Tamela Stakes, MD  Attending Physician, Critical Care Medicine [facility] See Amion for pager If no response to pager, please call [phone] until 7pm After 7pm, Please call E-link [phone]            [1]  Allergies Allergen Reactions   Lisinopril  Cough    Dry cough   "

## 2024-08-07 NOTE — Progress Notes (Addendum)
" °  Progress Note    08/07/2024 6:35 AM 9 Days Post-Op  Subjective:  just back from CT scan.  Looks more comfortable today.  Says she can wiggle her toes on the right foot, which is better.    Tm 100 now 99.3 HR 80's-90's  90's-150's systolic 100% [id number]  Vitals:   08/07/24 0400 08/07/24 0412  BP: (!) 155/84   Pulse: 83   Resp: 14   Temp: 99.3 F (37.4 C)   SpO2: 100% 100%    Physical Exam: General:  no distress; appears comfortable Cardiac:  regular Lungs:  non labored Incisions:  right chest and left groin incisions look good.  Right groin with prevena and minimal drainage in canister Extremities:  brisk doppler flow right pero and PT and left PT and DP.     CBC    Component Value Date/Time   WBC 9.9 08/07/2024 0158   RBC 2.17 (L) 08/07/2024 0158   HGB 6.9 (LL) 08/07/2024 0158   HGB 13.6 06/11/2024 1039   HCT 20.0 (L) 08/07/2024 0158   HCT 39.0 06/11/2024 1039   PLT 337 08/07/2024 0158   PLT 335 06/11/2024 1039   MCV 92.2 08/07/2024 0158   MCV 102 (H) 06/11/2024 1039   MCH 31.8 08/07/2024 0158   MCHC 34.5 08/07/2024 0158   RDW 15.3 08/07/2024 0158   RDW 13.1 06/11/2024 1039   LYMPHSABS 0.5 (L) 08/06/2024 1143   LYMPHSABS 2.9 08/25/2016 1648   MONOABS 1.0 08/06/2024 1143   EOSABS 0.0 08/06/2024 1143   EOSABS 0.2 08/25/2016 1648   BASOSABS 0.0 08/06/2024 1143   BASOSABS 0.0 08/25/2016 1648    BMET    Component Value Date/Time   NA 131 (L) 08/07/2024 0158   NA 132 (L) 06/11/2024 1039   K 3.7 08/07/2024 0158   CL 90 (L) 08/07/2024 0158   CO2 29 08/07/2024 0158   GLUCOSE 160 (H) 08/07/2024 0158   BUN 21 08/07/2024 0158   BUN 15 06/11/2024 1039   CREATININE 1.00 08/07/2024 0158   CREATININE 1.13 (H) 05/30/2024 0851   CALCIUM  7.6 (L) 08/07/2024 0158   GFRNONAA 59 (L) 08/07/2024 0158   GFRAA 78 08/19/2019 0830    INR    Component Value Date/Time   INR 1.1 08/06/2024 1640     Intake/Output Summary (Last 24 hours) at 08/07/2024 9364 Last data  filed at 08/07/2024 0500 Gross per 24 hour  Intake 1052.74 ml  Output 3468 ml  Net -2415.26 ml      Assessment/Plan:  74 y.o. female is s/p:  R axillary bifemoral bypass 07/29/2024 by Dr. Lanis for BLE CLI with rest pain   9 Days Post-Op   Looks more comfortable today from respiratory standpoint.   -pt continues to have brisk doppler flow bilateral feet.  Able to wiggle toes right foot, which is subjectively improved per pt.  Continue to float heels off the bed.  -acute blood loss anemia-just back from CT a/p to evaluate for hematoma.   -DVT prophylaxis:  sq heparin . -continue asa/statin. -continue PT/OT.  Yesterday, OT recommending CIR   Samantha Rhyne, PA-C [facility] [phone] 08/07/2024 6:35 AM  VASCULAR STAFF ADDENDUM: I have independently interviewed and examined the patient. I agree with the above.  Better today, now nasal cannula from Bipap.  CT with significant pleural effusions bilaterally.  Needs continued aggressive diuresis.   Fonda FORBES Lanis MD [facility] Phone Number: [phone] 08/07/2024 5:24 PM   "

## 2024-08-07 NOTE — Interval H&P Note (Signed)
 History and Physical Interval Note:  08/07/2024 8:40 AM  Pam [name]  has presented today for surgery, with the diagnosis of hr.  The various methods of treatment have been discussed with the patient and family. After consideration of risks, benefits and other options for treatment, the patient has consented to  Procedures: RIGHT HEART CATH (N/A) as a surgical intervention.  The patient's history has been reviewed, patient examined, no change in status, stable for surgery.  I have reviewed the patient's chart and labs.  Questions were answered to the patient's satisfaction.     Draydon Clairmont [employer]

## 2024-08-07 NOTE — Progress Notes (Addendum)
 "    Advanced Heart Failure Rounding Note  Cardiologist: Lonni Cash, MD  AHF Cardiologist: Establishing Dr. Rolan  Patient Profile   Pam [name] is a 74 y.o. female with  history of chronic HFrEF/mixed cardiomyopathy, CAD, PAD, chronic hyponatremia, COPD.    Admitted with acute respiratory failure, acute on chronic CHF and hyponatremia.  Significant Events:    1/5: started on Milrinone  for low co-ox, 53% 1/5: moved to CCU for acute hypoxic respiratory failure requiring Bipap, ?seizure like activity. CXR w/ pulm edema. Runs of VT treated w/ ICD shocks, milrinone  d/ced  (K 3.2, Mg 1.6)   Subjective:    Co-ox 62%  Good diuresis yesterday, 2.8L in UOP. CVP 6-7 Na improved w/ Tolvaptan , 131 today. SCr 1.0, K 3,7, Mg 2.2   No further VT   Now off Bipap, on 6L [place]. Feels better today.   Drop in Hgb overnight down to 6.9. CT A/P completed this am to r/o hematoma (pending)    Objective:   Weight Range: 55 kg Body mass index is 21.48 kg/m.   Vital Signs:   Temp:  [99 F (37.2 C)-100 F (37.8 C)] 99.5 F (37.5 C) (01/07 0700) Pulse Rate:  [72-95] 85 (01/07 0700) Resp:  [10-22] 15 (01/07 0700) BP: (96-155)/(39-84) 98/57 (01/07 0700) SpO2:  [84 %-100 %] 100 % (01/07 0700) FiO2 (%):  [40 %-60 %] 40 % (01/07 0029) Weight:  [54.3 kg-55 kg] 55 kg (01/07 0456) Last BM Date : 08/06/24  Weight change: Filed Weights   08/06/24 0448 08/06/24 1810 08/07/24 0456  Weight: 55.7 kg 54.3 kg 55 kg    Intake/Output:   Intake/Output Summary (Last 24 hours) at 08/07/2024 0718 Last data filed at 08/07/2024 0500 Gross per 24 hour  Intake 980.75 ml  Output 2793 ml  Net -1812.25 ml     Physical Exam   General:  frail, fatigued, chronically ill appearing. NAD    Cor: RRR. No MRG.   Lungs: clear  Extremities: warm and dry. No LEE   Telemetry   NSR 80s, no further VT, personally reviewed   Labs   CBC Recent Labs    08/06/24 0501 08/06/24 0622 08/06/24 1143  08/06/24 1640 08/07/24 0158  WBC 9.1   < > 10.8* 11.1* 9.9  NEUTROABS 7.8*  --  9.3*  --   --   HGB 7.8*   < > 7.5* 7.4* 6.9*  HCT 22.2*   < > 21.7* 22.1* 20.0*  MCV 89.9   < > 91.2 92.9 92.2  PLT 374   < > 389 414* 337   < > = values in this interval not displayed.   Basic Metabolic Panel Recent Labs    98/93/73 0200 08/06/24 0500 08/06/24 1640 08/06/24 2331 08/07/24 0158  NA 122*   < > 126* 130* 131*  K 3.4*   < > 3.6 4.0 3.7  CL 84*   < > 87* 89* 90*  CO2 26   < > 27 31 29   GLUCOSE 191*   < > 213* 157* 160*  BUN 23   < > 19 21 21   CREATININE 0.94   < > 0.95 0.97 1.00  CALCIUM  8.9   < > 8.0* 7.8* 7.6*  MG 2.3   < > 2.3  --  2.2  PHOS 3.0  --   --   --   --    < > = values in this interval not displayed.   Liver Function Tests Recent Labs  08/06/24 1143  AST 36  ALT 21  ALKPHOS 54  BILITOT 0.4  PROT 5.3*  ALBUMIN  3.2*   No results for input(s): LIPASE, AMYLASE in the last 72 hours. Cardiac Enzymes No results for input(s): CKTOTAL, CKMB, CKMBINDEX, TROPONINI in the last 72 hours.  BNP: BNP (last 3 results) Recent Labs    11/22/23 1129 03/26/24 1423 05/29/24 1206  BNP 472.0* 614.1* 841.1*    ProBNP (last 3 results) Recent Labs    07/24/24 1016 08/02/24 1551  PROBNP 4,537.0* 5,039.0*     D-Dimer No results for input(s): DDIMER in the last 72 hours. Hemoglobin A1C No results for input(s): HGBA1C in the last 72 hours. Fasting Lipid Panel No results for input(s): CHOL, HDL, LDLCALC, TRIG, CHOLHDL, LDLDIRECT in the last 72 hours. Medications:   Scheduled Medications:  sodium chloride    Intravenous Once   acetylcysteine   4 mL Nebulization TID   arformoterol   15 mcg Nebulization BID   aspirin  EC  81 mg Oral Q0600   atorvastatin   80 mg Oral Daily   Chlorhexidine  Gluconate Cloth  6 each Topical Daily   collagenase    Topical Daily   heparin   5,000 Units Subcutaneous Q8H   insulin  aspart  0-15 Units Subcutaneous Q4H    ipratropium-albuterol   3 mL Nebulization Q4H   levothyroxine   37.5 mcg Oral Q0600   methylPREDNISolone  (SOLU-MEDROL ) injection  40 mg Intravenous Q12H   mouth rinse  15 mL Mouth Rinse 4 times per day   potassium chloride   20 mEq Oral Once   ranolazine   1,000 mg Oral BID   revefenacin   175 mcg Nebulization Daily   sodium chloride  flush  10-40 mL Intracatheter Q12H   sodium chloride  flush  3 mL Intravenous Q12H   sodium chloride  flush  3 mL Intravenous Q12H   sodium chloride  HYPERTONIC  4 mL Nebulization BID   spironolactone   25 mg Oral Daily   valACYclovir   1,000 mg Oral Daily    Infusions:  sodium chloride      amiodarone  30 mg/hr (08/07/24 0500)   furosemide  (LASIX ) 200 mg in dextrose  5 % 100 mL (2 mg/mL) infusion 10 mg/hr (08/07/24 0500)   piperacillin -tazobactam (ZOSYN )  IV 3.375 g (08/07/24 0510)    PRN Medications: sodium chloride , acetaminophen , calcium  carbonate, guaiFENesin -dextromethorphan, HYDROmorphone  (DILAUDID ) injection, ondansetron  (ZOFRAN ) IV, mouth rinse, oxyCODONE -acetaminophen , phenol, polyethylene glycol, senna, sodium chloride  flush, sodium chloride  flush, sodium chloride  flush  Assessment/Plan   1. Acute hypoxemic respiratory failure: Patient is a prior smoker with h/o COPD but has not been on home oxygen. Admit CXR with bilateral lower lobe infiltrates, but procalcitonin has not been elevated.  Respiratory virus panel negative.  Concern for COPD exacerbation.  Volume overload also thought to contribute. Improved today w/ diuresis and treatment of COPD. Off Bipap. On 6L [place].  - Continue Lasix  gtt for now, plan RHC today  - Empiric Zosyn  per CCM.  - On Solumedrol 40 mg IV bid for COPD exacerbation.  2. Acute on chronic systolic CHF: Ischemic cardiomyopathy, has MDT ICD.  Echo this admission with EF 30-35%, WMAs, normal RV, IVC not dilated.  Exam is difficult for volume but increased oxygen requirement and on Bipap.  Was started on milrinone  with low co-ox but this  was stopped due to VT. Co-ox 62% this morning off milrinone . Diuresing well w/ lasix  gtt, CVP 6-7   - Continue Lasix  gtt 10 mg/hr for now. Plan RHC this morning  - Spironolactone  25 mg daily.  3. CAD: NSTEMI 12/17, CTO  LAD with collaterals and severe pLCx/OM1 stenosis on cath.  She had PCI to pLCx and OM1.  No chest pain.  - Continue ASA 81 - Continue statin.  - Continue ranolazine  4. Hyponatremia: This appears chronic, Na in upper 120s -130 range generally, has been seen by endocrinology in the past.  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  ?Baseline SIADH with additional component of hypervolemic hyponatremia this admission vs worsening SIADH due to COPD exacerbation/lung infection. Tolvaptan  given 1/6. NA improving, 131 today - Fluid restrict.   - Continue diuresis.   5. PAD: S/p R sided axillobifemoral bypass using PTFE and R CFA endarterectomy this admission.  - ASA - Statin - VVS following.  6. Post-op anemia/ IDA  - further drop in Hgb down to 6.9. CT of A/P done to r/o hematoma, results pending - transfuse 1 uRBC  - Fe studies c/w IDA, Tsat 3%. Will assess need for IV Fe post RBC transfusion  7. VT: Multiple runs with ICD shocks in setting of low K and Mg and milrinone  initiation.  Now off milrinone . No further VT. K and Mg now replete  - Continue amiodarone  gtt 30 mg/hr.  - Keep K > 4.0 and Mg > 2.0   CRITICAL CARE Performed by: Caffie Shed   Total critical care time: 15 minutes  Critical care time was exclusive of separately billable procedures and treating other patients.  Critical care was necessary to treat or prevent imminent or life-threatening deterioration.  Critical care was time spent personally by me on the following activities: development of treatment plan with patient and/or surrogate as well as nursing, discussions with consultants, evaluation of patient's response to treatment, examination of patient, obtaining history from patient or surrogate, ordering and  performing treatments and interventions, ordering and review of laboratory studies, ordering and review of radiographic studies, pulse oximetry and re-evaluation of patient's condition.   Length of Stay: [address], PA-C  08/07/2024, 7:18 AM  Advanced Heart Failure Team Pager [phone] (M-F; 7a - 5p)   Please visit [email or web address]: For overnight coverage please call cardiology fellow first. If fellow not available call Shock/ECMO MD on call.  For ECMO / Mechanical Support (Impella, IABP, LVAD) issues call Shock / ECMO MD on call.    Patient seen with PA, I formulated the plan and agree with the above note.   RHC Procedural Findings: Hemodynamics (mmHg) RA mean 7 RV 43/12 PA 46/16, mean 29 PCWP mean 15 Oxygen saturations: PA 46% AO 90% Cardiac Output (Fick) 5.04  Cardiac Index (Fick) 3.22  PVR 2.8 WU Cardiac Output (Thermo) 3.49  Cardiac Index (Thermo) 2.23  PVR 4 WU PAPi 4.3   Patient is now on 6L [place], off Bipap.  Hgb down to 6.9 today with no overt bleeding. CT abdomen/pelvis with no RP hematoma or significant surgical site hematoma.   I/Os net negative 1750.   General: Frail Neck: No JVD, no thyromegaly or thyroid  nodule.  Lungs: Distant BS.  CV: Nondisplaced PMI.  Heart regular S1/S2, no S3/S4, no murmur.  Trace ankle edema.   Abdomen: Soft, nontender, no hepatosplenomegaly, no distention.  Skin: Intact without lesions or rashes.  Neurologic: Alert and oriented x 3.  Psych: Normal affect. Extremities: No clubbing or cyanosis.  HEENT: Normal.   RHC today showed upper normal PCWP and preserved cardiac output.  I do not think that CHF explains her degree of hypoxemia. She remains on 6L [place].  Respiratory panel repeated and again negative. ?COPD exacerbation.  - Continue Solumedrol  and Zosyn  for ?COPD exacerbation.  - Can continue Lasix  10 mg/hr until this evening since she will be getting blood.  Can likely transition to po diuretic for tomorrow.  - SBP 90s-100s,  continue spironolactone .  Will try to add low dose losartan  probably tomorrow.   Na up to 131, continue po fluid restriction.  No tolvaptan  today.   Can transition amiodarone  to po today, no further VT.   Hgb 6.9, no definite site for bleeding by CT abd/pelvis.  Will give 1 unit PRBCs and will also need IV Fe.   CRITICAL CARE Performed by: Ezra Shuck  Total critical care time: 35 minutes  Critical care time was exclusive of separately billable procedures and treating other patients.  Critical care was necessary to treat or prevent imminent or life-threatening deterioration.  Critical care was time spent personally by me on the following activities: development of treatment plan with patient and/or surrogate as well as nursing, discussions with consultants, evaluation of patient's response to treatment, examination of patient, obtaining history from patient or surrogate, ordering and performing treatments and interventions, ordering and review of laboratory studies, ordering and review of radiographic studies, pulse oximetry and re-evaluation of patient's condition.    "

## 2024-08-07 NOTE — Progress Notes (Addendum)
 Hgb drifted further down to 6.9  Plan Will transfuse 1 unit Will go ahead and get CT abd/pelvis looking for hematoma

## 2024-08-07 NOTE — Progress Notes (Signed)
 Inpatient Rehab Admissions Coordinator Note:   Per OT recommendations patient was screened for CIR candidacy by Reche FORBES Lowers, PT. At this time, pt appears to be a potential candidate for CIR. I will place an order for rehab consult for full assessment, per our protocol.  Please contact me any with questions.SABRA Reche Lowers, PT, DPT [phone] 08/07/2024 4:36 PM

## 2024-08-07 NOTE — Plan of Care (Signed)
  Problem: Health Behavior/Discharge Planning: Goal: Ability to manage health-related needs will improve Outcome: Progressing   Problem: Clinical Measurements: Goal: Ability to maintain clinical measurements within normal limits will improve Outcome: Progressing   Problem: Coping: Goal: Level of anxiety will decrease Outcome: Progressing   

## 2024-08-07 NOTE — Progress Notes (Deleted)
 Pharmacy Electrolyte Replacement  Recent Labs:  Recent Labs    08/06/24 0200 08/06/24 0500 08/07/24 0158  K 3.4*   < > 3.7  MG 2.3   < > 2.2  PHOS 3.0  --   --   CREATININE 0.94   < > 1.00   < > = values in this interval not displayed.    Low Critical Values (K </= 2.5, Phos </= 1, Mg </= 1) Present: None  Plan: KCL 40 meq PO x 1.   Kynslei Art, PharmD

## 2024-08-07 NOTE — Plan of Care (Signed)
  Problem: Education: Goal: Knowledge of General Education information will improve Description Including pain rating scale, medication(s)/side effects and non-pharmacologic comfort measures Outcome: Progressing   Problem: Clinical Measurements: Goal: Diagnostic test results will improve Outcome: Progressing Goal: Respiratory complications will improve Outcome: Progressing   Problem: Coping: Goal: Level of anxiety will decrease Outcome: Progressing   

## 2024-08-07 NOTE — Progress Notes (Signed)
 All patient belongings sent home with husband upon admission to [facility] ICU on 08/05/2024. Remaining items include patients cell phone, eyeglasses, and dentures.

## 2024-08-07 NOTE — Progress Notes (Signed)
 Physical Therapy Treatment Patient Details Name: Pam [name] MRN: [id number] DOB: Oct 26, 1950 Today's Date: 08/07/2024   History of Present Illness Pt is a 74 y.o female admitted 12/24 for SOB and bil LE edema. Chest x-ray showed pulmonary edema. Pt is scheduled for bypass surgery on 12/29. PMH: CAD, CHF, PVD, ICD, HLD, PAD, NSTEMI, cataracts    PT Comments  PT assisted RN in getting pt up and to recliner after heart cath this morning. Pt looks much better than prior PT session. Continues to need encouragement to participate but once she has started moving she does well. Pt is contact guard for bed mobility, contact guard to min A for transfer to recliner. Reminded pt about IS use. D/c plans remain appropriate with full support of husband at home. PT will continue to follow acutely.     If plan is discharge home, recommend the following: A little help with walking and/or transfers;A little help with bathing/dressing/bathroom;Assist for transportation;Help with stairs or ramp for entrance   Can travel by private vehicle        Equipment Recommendations  Rolling walker (2 wheels)    Recommendations for Other Services       Precautions / Restrictions Precautions Precautions: Fall Recall of Precautions/Restrictions: Intact Precaution/Restrictions Comments: watch o2 Restrictions Weight Bearing Restrictions Per Provider Order: No     Mobility  Bed Mobility Overal bed mobility: Needs Assistance Bed Mobility: Sit to Supine     Supine to sit: Contact guard, HOB elevated     General bed mobility comments: pt able to manage LE off bed and scoot her hips to EOB before pushing up from elevated HoB to come to static sitting    Transfers Overall transfer level: Needs assistance Equipment used: Rolling walker (2 wheels) Transfers: Sit to/from Stand, Bed to chair/wheelchair/BSC Sit to Stand: Min assist   Step pivot transfers: Contact guard assist, Min assist       General transfer  comment: good power up and light min A for steadying in standing, pt is able to take pivotal steps from bed to recliner, with contact guard and assist for management of Lines and leads        Balance Overall balance assessment: Needs assistance Sitting-balance support: No upper extremity supported, Feet supported Sitting balance-Leahy Scale: Fair Sitting balance - Comments: seated EOB   Standing balance support: Single extremity supported, Bilateral upper extremity supported Standing balance-Leahy Scale: Poor Standing balance comment: reliant of RW                            Communication Communication Communication: Impaired Factors Affecting Communication: Hearing impaired  Cognition Arousal: Alert Behavior During Therapy: WFL for tasks assessed/performed   PT - Cognitive impairments: No apparent impairments                         Following commands: Intact      Cueing Cueing Techniques: Verbal cues     General Comments General comments (skin integrity, edema, etc.): BP 106/56 with movement to chair, VSS on 6L O2 via [place]      Pertinent Vitals/Pain Pain Assessment Pain Assessment: Faces Faces Pain Scale: Hurts little more Pain Location: L groin and L foot Pain Descriptors / Indicators: Discomfort, Grimacing, Guarding Pain Intervention(s): Limited activity within patient's tolerance, Monitored during session, Repositioned     PT Goals (current goals can now be found in the care plan section) Acute Rehab PT  Goals Patient Stated Goal: to feel better Progress towards PT goals: Progressing toward goals    Frequency    Min 2X/week       AM-PAC PT 6 Clicks Mobility   Outcome Measure  Help needed turning from your back to your side while in a flat bed without using bedrails?: A Little Help needed moving from lying on your back to sitting on the side of a flat bed without using bedrails?: A Little Help needed moving to and from a bed to a  chair (including a wheelchair)?: A Little Help needed standing up from a chair using your arms (e.g., wheelchair or bedside chair)?: A Little Help needed to walk in hospital room?: A Little Help needed climbing 3-5 steps with a railing? : Total 6 Click Score: 16    End of Session Equipment Utilized During Treatment: Oxygen Activity Tolerance: Patient tolerated treatment well Patient left: with call bell/phone within reach;with family/visitor present;in chair;with chair alarm set Nurse Communication: Mobility status;Other (comment) (oxygen) PT Visit Diagnosis: Other abnormalities of gait and mobility (R26.89);Muscle weakness (generalized) (M62.81);Pain;Difficulty in walking, not elsewhere classified (R26.2) Pain - Right/Left: Left Pain - part of body: Ankle and joints of foot (groin)     Time: 8548-8491 PT Time Calculation (min) (ACUTE ONLY): 17 min  Charges:    $Therapeutic Activity: 8-22 mins PT General Charges $$ ACUTE PT VISIT: 1 Visit                     Renzo Vincelette B. Fleeta Lapidus PT, DPT Acute Rehabilitation Services Please use secure chat or  Call Office [phone]    [name] Fleeta [facility] 08/07/2024, 3:45 PM

## 2024-08-07 NOTE — Progress Notes (Signed)
 PT Cancellation Note  Patient Details Name: Pam [name] MRN: [id number] DOB: 17-May-1951   Cancelled Treatment:    Reason Eval/Treat Not Completed: (P) Patient at procedure or test/unavailable Pt is off floor for heart cath. PT will follow back later this afternoon as able.  Pam [name] PT, DPT Acute Rehabilitation [name] Please use secure chat or  Call Office [phone]    Pam [name] 08/07/2024, 8:31 AM

## 2024-08-07 NOTE — Consult Note (Addendum)
 WOC Nurse re-consult Note: Refer to previous WOC consult note on 1/5 for assessment and plan of care. Requested to assess sacrum; performed remotely after review of progress notes and photos in the EMR.  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  This was noted as present on admission. Darker colored skin remains, surrounding the location. Pt remains with Stage 3 pressure injuries to left and right buttocks which were NOT present on admission.   I will revise the current plan of care by discontinuing the use of Santyl .  Topical treatment orders provided for bedside nurses to perform as follows: Cut piece of Aquacel Soila # [phone]) and apply to sacrum and left and right buttocks wounds Q day, then cover with foam dressing. Change foam dressing Q 3 days or PRN soiling  WOC team will reassess the site Q 7-10 days to determine if a change in the plan of care is indicated at that time.   Thank-you,   Stephane Fought MSN, RN, CWOCN, CWCN-AP, CNS Contact Mon-Fri 0700-1500: [phone]

## 2024-08-08 ENCOUNTER — Inpatient Hospital Stay (HOSPITAL_COMMUNITY)

## 2024-08-08 DIAGNOSIS — E440 Moderate protein-calorie malnutrition: Secondary | ICD-10-CM | POA: Insufficient documentation

## 2024-08-08 DIAGNOSIS — I5020 Unspecified systolic (congestive) heart failure: Secondary | ICD-10-CM | POA: Diagnosis not present

## 2024-08-08 LAB — TYPE AND SCREEN
ABO/RH(D): A POS
Antibody Screen: NEGATIVE
Unit division: 0

## 2024-08-08 LAB — BPAM RBC
Blood Product Expiration Date: 202601132359
ISSUE DATE / TIME: 202601070942
Unit Type and Rh: 6200

## 2024-08-08 LAB — BASIC METABOLIC PANEL WITH GFR
Anion gap: 11 (ref 5–15)
Anion gap: 9 (ref 5–15)
BUN: 32 mg/dL — ABNORMAL HIGH (ref 8–23)
BUN: 33 mg/dL — ABNORMAL HIGH (ref 8–23)
CO2: 30 mmol/L (ref 22–32)
CO2: 31 mmol/L (ref 22–32)
Calcium: 8.7 mg/dL — ABNORMAL LOW (ref 8.9–10.3)
Calcium: 9.3 mg/dL (ref 8.9–10.3)
Chloride: 89 mmol/L — ABNORMAL LOW (ref 98–111)
Chloride: 90 mmol/L — ABNORMAL LOW (ref 98–111)
Creatinine, Ser: 1.02 mg/dL — ABNORMAL HIGH (ref 0.44–1.00)
Creatinine, Ser: 1.12 mg/dL — ABNORMAL HIGH (ref 0.44–1.00)
GFR, Estimated: 52 mL/min — ABNORMAL LOW
GFR, Estimated: 58 mL/min — ABNORMAL LOW
Glucose, Bld: 102 mg/dL — ABNORMAL HIGH (ref 70–99)
Glucose, Bld: 137 mg/dL — ABNORMAL HIGH (ref 70–99)
Potassium: 3.8 mmol/L (ref 3.5–5.1)
Potassium: 4.1 mmol/L (ref 3.5–5.1)
Sodium: 130 mmol/L — ABNORMAL LOW (ref 135–145)
Sodium: 130 mmol/L — ABNORMAL LOW (ref 135–145)

## 2024-08-08 LAB — COOXEMETRY PANEL
Carboxyhemoglobin: 3.9 % — ABNORMAL HIGH (ref 0.5–1.5)
Methemoglobin: 0.8 % (ref 0.0–1.5)
O2 Saturation: 46.1 %
Total hemoglobin: 8.5 g/dL — ABNORMAL LOW (ref 12.0–16.0)

## 2024-08-08 LAB — CULTURE, RESPIRATORY W GRAM STAIN

## 2024-08-08 LAB — GLUCOSE, CAPILLARY
Glucose-Capillary: 131 mg/dL — ABNORMAL HIGH (ref 70–99)
Glucose-Capillary: 134 mg/dL — ABNORMAL HIGH (ref 70–99)
Glucose-Capillary: 146 mg/dL — ABNORMAL HIGH (ref 70–99)
Glucose-Capillary: 157 mg/dL — ABNORMAL HIGH (ref 70–99)
Glucose-Capillary: 87 mg/dL (ref 70–99)
Glucose-Capillary: 92 mg/dL (ref 70–99)

## 2024-08-08 LAB — CBC
HCT: 23.9 % — ABNORMAL LOW (ref 36.0–46.0)
Hemoglobin: 8.2 g/dL — ABNORMAL LOW (ref 12.0–15.0)
MCH: 30.5 pg (ref 26.0–34.0)
MCHC: 34.3 g/dL (ref 30.0–36.0)
MCV: 88.8 fL (ref 80.0–100.0)
Platelets: 333 K/uL (ref 150–400)
RBC: 2.69 MIL/uL — ABNORMAL LOW (ref 3.87–5.11)
RDW: 16.2 % — ABNORMAL HIGH (ref 11.5–15.5)
WBC: 9.7 K/uL (ref 4.0–10.5)
nRBC: 0.5 % — ABNORMAL HIGH (ref 0.0–0.2)

## 2024-08-08 LAB — MAGNESIUM: Magnesium: 2.1 mg/dL (ref 1.7–2.4)

## 2024-08-08 MED ORDER — SODIUM CHLORIDE 0.9 % IV SOLN
500.0000 mg | Freq: Once | INTRAVENOUS | Status: AC
  Administered 2024-08-08: 500 mg via INTRAVENOUS
  Filled 2024-08-08: qty 25

## 2024-08-08 MED ORDER — POTASSIUM CHLORIDE CRYS ER 20 MEQ PO TBCR
40.0000 meq | EXTENDED_RELEASE_TABLET | Freq: Once | ORAL | Status: AC
  Administered 2024-08-08: 40 meq via ORAL
  Filled 2024-08-08: qty 2

## 2024-08-08 MED ORDER — FUROSEMIDE 20 MG PO TABS
20.0000 mg | ORAL_TABLET | Freq: Every day | ORAL | Status: DC
  Administered 2024-08-08: 20 mg via ORAL
  Filled 2024-08-08 (×2): qty 1

## 2024-08-08 MED ORDER — SACUBITRIL-VALSARTAN 24-26 MG PO TABS
1.0000 | ORAL_TABLET | Freq: Two times a day (BID) | ORAL | Status: DC
  Administered 2024-08-08 – 2024-08-10 (×6): 1 via ORAL
  Filled 2024-08-08 (×7): qty 1

## 2024-08-08 MED ORDER — INSULIN ASPART 100 UNIT/ML IJ SOLN: 0.0000 [IU] | Freq: Three times a day (TID) | INTRAMUSCULAR | Status: DC

## 2024-08-08 MED ORDER — IRON SUCROSE 500 MG IVPB - SIMPLE MED: 500.0000 mg | Freq: Once | INTRAVENOUS | Status: DC

## 2024-08-08 MED ORDER — IOHEXOL 350 MG/ML SOLN: 75.0000 mL | Freq: Once | INTRAVENOUS | Status: DC | PRN

## 2024-08-08 MED ORDER — IOHEXOL 350 MG/ML SOLN
75.0000 mL | Freq: Once | INTRAVENOUS | Status: AC | PRN
  Administered 2024-08-08: 75 mL via INTRAVENOUS

## 2024-08-08 NOTE — Progress Notes (Signed)
 Mobility Specialist: Progress Note   08/08/24 1505  Mobility  Activity Ambulated with assistance  Level of Assistance Contact guard assist, steadying assist  Assistive Device Front wheel walker  Distance Ambulated (ft) 18 ft  Activity Response Tolerated well  Mobility Referral Yes  Mobility visit 1 Mobility  Mobility Specialist Start Time (ACUTE ONLY) 1106    Pt received in bed, eager for mobility. Light minA for bed mobility to assist scooting EOB. CGA for STS, CGA initially for ambulation but pt progressed to close SV. Room distance mostly limited by lines. Ambulated a little past the sink then turned and went around the bed to the chair. Feeling good afterwards, no complaints. Left in chair with all needs met, call bell in reach. Husband present.  Ileana Lute Mobility Specialist Please contact via SecureChat or Rehab office at [phone]

## 2024-08-08 NOTE — Progress Notes (Signed)
 Occupational Therapy Treatment Patient Details Name: Pam [name] MRN: [id number] DOB: 11/20/1950 Today's Date: 08/08/2024   History of present illness Pt is a 74 y.o female admitted 12/24 for SOB and bil LE edema. Chest x-ray showed pulmonary edema. Pt is scheduled for bypass surgery on 12/29. PMH: CAD, CHF, PVD, ICD, HLD, PAD, NSTEMI, cataracts   OT comments   Pt progressing well towards goals. Progressed to complete bathing tasks with min assist, and toilet hygiene at CGA. Pt limited to seated ADLs d/t fatigue, requiring x3 rest breaks with activity. Continue to recommend >3 hours of skilled rehab daily to optimize independence levels. Will continue to follow acutely.       If plan is discharge home, recommend the following:  A little help with walking and/or transfers;A little help with bathing/dressing/bathroom;Assistance with cooking/housework;Assist for transportation;Help with stairs or ramp for entrance   Equipment Recommendations  BSC/3in1;Tub/shower bench;Other (comment)       Precautions / Restrictions Precautions Precautions: Fall Recall of Precautions/Restrictions: Intact Precaution/Restrictions Comments: watch o2 Restrictions Weight Bearing Restrictions Per Provider Order: No       Mobility Bed Mobility     General bed mobility comments: Received with RN in standing, returned to EOB with NT    Transfers Overall transfer level: Needs assistance Equipment used: Rolling walker (2 wheels) Transfers: Sit to/from Stand Sit to Stand: Contact guard assist           General transfer comment: CGA for balance, limited by decreased activity tolerance     Balance Overall balance assessment: Needs assistance Sitting-balance support: No upper extremity supported, Feet supported Sitting balance-Leahy Scale: Good     Standing balance support: Single extremity supported, Bilateral upper extremity supported Standing balance-Leahy Scale: Poor Standing balance  comment: reliant of RW       ADL either performed or assessed with clinical judgement   ADL Overall ADL's : Needs assistance/impaired     Grooming: Brushing hair;Sitting   Upper Body Bathing: Minimal assistance;Sitting   Lower Body Bathing: Minimal assistance;Sit to/from stand           Toilet Transfer: Contact guard assist;Ambulation;Rolling walker (2 wheels)   Toileting- Clothing Manipulation and Hygiene: Contact guard assist;Sit to/from stand       Functional mobility during ADLs: Contact guard assist;Rolling walker (2 wheels) General ADL Comments: Limited to seated ADLs d/t fatigue    Extremity/Trunk Assessment Upper Extremity Assessment Upper Extremity Assessment: Generalized weakness   Lower Extremity Assessment Lower Extremity Assessment: Defer to PT evaluation        Vision   Vision Assessment?: No apparent visual deficits         Communication Communication Communication: Impaired Factors Affecting Communication: Hearing impaired   Cognition Arousal: Alert Behavior During Therapy: WFL for tasks assessed/performed Cognition: No apparent impairments         Following commands: Intact        Cueing   Cueing Techniques: Verbal cues        General Comments Spouse present and supportive    Pertinent Vitals/ Pain       Pain Assessment Pain Assessment: Faces Faces Pain Scale: Hurts a little bit Pain Location: L groin and L foot Pain Descriptors / Indicators: Discomfort, Grimacing, Guarding Pain Intervention(s): Limited activity within patient's tolerance   Frequency  Min 2X/week        Progress Toward Goals  OT Goals(current goals can now be found in the care plan section)  Progress towards OT goals: Progressing toward goals  AM-PAC OT 6 Clicks Daily Activity     Outcome Measure   Help from another person eating meals?: None Help from another person taking care of personal grooming?: A Little Help from another  person toileting, which includes using toliet, bedpan, or urinal?: A Little Help from another person bathing (including washing, rinsing, drying)?: A Little Help from another person to put on and taking off regular upper body clothing?: A Little Help from another person to put on and taking off regular lower body clothing?: A Little 6 Click Score: 19    End of Session Equipment Utilized During Treatment: Rolling walker (2 wheels);Oxygen  OT Visit Diagnosis: Unsteadiness on feet (R26.81);Other abnormalities of gait and mobility (R26.89);Muscle weakness (generalized) (M62.81);Pain Pain - Right/Left: Right   Activity Tolerance Patient tolerated treatment well   Patient Left in bed;with call bell/phone within reach;with nursing/sitter in room   Nurse Communication Mobility status        Time: 8665-8575 OT Time Calculation (min): 50 min  Charges: OT General Charges $OT Visit: 1 Visit OT Treatments $Self Care/Home Management : 38-52 mins  Adrianne BROCKS, OT  Acute Rehabilitation Services Office [phone] Secure chat preferred   Adrianne GORMAN Savers 08/08/2024, 4:28 PM

## 2024-08-08 NOTE — Plan of Care (Signed)
 " Problem: Education: Goal: Knowledge of General Education information will improve Description: Including pain rating scale, medication(s)/side effects and non-pharmacologic comfort measures Outcome: Progressing   Problem: Health Behavior/Discharge Planning: Goal: Ability to manage health-related needs will improve Outcome: Progressing   Problem: Clinical Measurements: Goal: Ability to maintain clinical measurements within normal limits will improve Outcome: Progressing Goal: Will remain free from infection Outcome: Progressing Goal: Diagnostic test results will improve Outcome: Progressing Goal: Respiratory complications will improve Outcome: Progressing Goal: Cardiovascular complication will be avoided Outcome: Progressing   Problem: Activity: Goal: Risk for activity intolerance will decrease Outcome: Progressing   Problem: Nutrition: Goal: Adequate nutrition will be maintained Outcome: Progressing   Problem: Coping: Goal: Level of anxiety will decrease Outcome: Progressing   Problem: Elimination: Goal: Will not experience complications related to bowel motility Outcome: Progressing Goal: Will not experience complications related to urinary retention Outcome: Progressing   Problem: Pain Managment: Goal: General experience of comfort will improve and/or be controlled Outcome: Progressing   Problem: Safety: Goal: Ability to remain free from injury will improve Outcome: Progressing   Problem: Skin Integrity: Goal: Risk for impaired skin integrity will decrease Outcome: Progressing   Problem: Education: Goal: Knowledge of the prescribed therapeutic regimen will improve Outcome: Progressing   Problem: Bowel/Gastric: Goal: Gastrointestinal status for postoperative course will improve Outcome: Progressing   Problem: Cardiac: Goal: Ability to maintain an adequate cardiac output Outcome: Progressing Goal: Will show no evidence of cardiac arrhythmias Outcome:  Progressing   Problem: Nutritional: Goal: Will attain and maintain optimal nutritional status Outcome: Progressing   Problem: Neurological: Goal: Will regain or maintain usual level of consciousness Outcome: Progressing   Problem: Clinical Measurements: Goal: Ability to maintain clinical measurements within normal limits Outcome: Progressing Goal: Postoperative complications will be avoided or minimized Outcome: Progressing   Problem: Respiratory: Goal: Will regain and/or maintain adequate ventilation Outcome: Progressing Goal: Respiratory status will improve Outcome: Progressing   Problem: Skin Integrity: Goal: Demonstrates signs of wound healing without infection Outcome: Progressing   Problem: Urinary Elimination: Goal: Will remain free from infection Outcome: Progressing Goal: Ability to achieve and maintain adequate urine output Outcome: Progressing   Problem: Education: Goal: Ability to demonstrate management of disease process will improve Outcome: Progressing Goal: Ability to verbalize understanding of medication therapies will improve Outcome: Progressing Goal: Individualized Educational Video(s) Outcome: Progressing   Problem: Activity: Goal: Capacity to carry out activities will improve Outcome: Progressing   Problem: Cardiac: Goal: Ability to achieve and maintain adequate cardiopulmonary perfusion will improve Outcome: Progressing   Problem: Education: Goal: Ability to describe self-care measures that may prevent or decrease complications (Diabetes Survival Skills Education) will improve Outcome: Progressing Goal: Individualized Educational Video(s) Outcome: Progressing   Problem: Coping: Goal: Ability to adjust to condition or change in health will improve Outcome: Progressing   Problem: Fluid Volume: Goal: Ability to maintain a balanced intake and output will improve Outcome: Progressing   Problem: Health Behavior/Discharge Planning: Goal:  Ability to identify and utilize available resources and services will improve Outcome: Progressing Goal: Ability to manage health-related needs will improve Outcome: Progressing   Problem: Metabolic: Goal: Ability to maintain appropriate glucose levels will improve Outcome: Progressing   Problem: Nutritional: Goal: Maintenance of adequate nutrition will improve Outcome: Progressing Goal: Progress toward achieving an optimal weight will improve Outcome: Progressing   Problem: Skin Integrity: Goal: Risk for impaired skin integrity will decrease Outcome: Progressing   Problem: Tissue Perfusion: Goal: Adequacy of tissue perfusion will improve Outcome: Progressing   Problem:  Education: Goal: Understanding of CV disease, CV risk reduction, and recovery process will improve Outcome: Progressing Goal: Individualized Educational Video(s) Outcome: Progressing   "

## 2024-08-08 NOTE — Progress Notes (Signed)
" °  Inpatient Rehabilitation Admissions Coordinator   Met with patient and spouse at bedside for rehab assessment. We discussed goals and expectations of a possible CIR admit.  I discussed that PT recommends HH and OT recommends CIR. I discussed that she is currently not at level to pursue CIR level rehab. Therapy would be up out of the bed, not bed level exercises. Her choices currently would be SNF, HH or OP and if she would progress further with therapy, we could consider CIR if still recommended. She states it is early and just beginning to work with therapy. She prefers to go home but is open to further discussions. I will follow up with her in a couple of days. Please call me with any questions.   Heron Leavell, RN, MSN Rehab Admissions Coordinator [phone]   "

## 2024-08-08 NOTE — Progress Notes (Signed)
 " PROGRESS NOTE    Pam [name]  FMW:[id number] DOB: 09/10/1950 DOA: 07/24/2024 PCP: Andrew Truman GRADE., MD    Brief Narrative:  74 y.o. 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  Admission has been complicated by fluid overload, hypotension and respiratory insufficiency.  On 1/5 evening hrs had 4 episodes of VT triggering ICD shocks. Administered additional amiodarone  bolus. Milrinone  stopped. K 3.2 , Mg 1.6. has chronic LAD. W/ mild ST elevation. During events there was change in mental status with concern for possible seizure however this was felt primarily to be syncopal event associated with the VT.    Assessment and Plan:  Acute hypoxic respiratory failure secondary to diffuse pulmonary infiltrates consistent with Worsening pulmonary edema, complicated further by small bilateral pleural effusions, +/- pneumonia -improving  - Currently on 3 L nasal cannula. -continue scheduled bronchodilators -Currently day #5/7 Zosyn  (sputum culture showing Pseudomonas)   Acute on chronic systolic heart failure.  Baseline EF 30 to 35%, current EF on 1 1 remains the same at 130 to 35%.  There is left ventricular regional wall abnormalities RV size moderately enlarged with moderately enlarged pulmonary artery systolic pressures Continue telemetry monitoring Transduce central venous pressure Aggressive diuresis Iron  level is low infusions ordered by cardiology team.   Acute on Chronic Anemia etiology unclear No obvious bleeding Monitor Hb. CT abdomen pelvis did not show any acute cause of bleed hemoglobin improved after 1 unit blood transfusion.   History of known coronary artery disease with chronically occluded LAD.  Currently with ST elevation in V1 and V2, with elevated troponin.  Troponin is flat.  Likely demand ischemia.   -Defer to cardiology   VT.  Status post ICD shock x 4 amiodarone  infusion changed to p.o. Ensure potassium  greater than 4 and magnesium  greater than 2 Telemetry monitoring -Defer to cardiology   Lactic acidosis. -resolved   Brief change in mental status-delirium  CT head shows nothing acute.  EEG shows no seizures. -Appears to be back to baseline   Acute on chronic hyponatremia.  Suspect secondary to heart failure. Status post tolvaptan  on 08/06/2024. Daily labs    Hypokalemia, hypochloremia, Hypomagnesemia. -replete   Hypothyroidism Continuing Synthroid    Critical limb ischemia status post right sided axillobifemoral bypass with right common femoral endarterectomy on 12/29 Mild bleeding for heparin  shot from left arm Continuing Plavix  and aspirin  as well as statin     Hyperglycemia. -SSI  Pressure ulcers Wound 07/24/24 1830 Pressure Injury Sacrum Mid Stage 3 -  Full thickness tissue loss. Subcutaneous fat may be visible but bone, tendon or muscle are NOT exposed. (Active)     Wound 08/05/24 Pressure Injury Buttocks Right;Left Stage 3 -  Full thickness tissue loss. Subcutaneous fat may be visible but bone, tendon or muscle are NOT exposed. (Active)     ? CIR  DVT prophylaxis: SCDs Start: 08/05/24 2136 PLACE TED HOSE Start: 07/31/24 1415    Code Status: Full Code   Disposition Plan:  Level of care: Progressive Status is: Inpatient     Consultants:  Cardiology PCCM Neurology Vascular   Subjective: Up walking with ambulatory specialist   Objective: Vitals:   08/08/24 0836 08/08/24 0848 08/08/24 1127 08/08/24 1220  BP:  (!) 132/54 (!) 112/54   Pulse:   80   Resp:  20    Temp:  97.7 F (36.5 C) 98.2 F (36.8 C)   TempSrc:  Axillary    SpO2: 100%  94% 95%  Weight:      Height:        Intake/Output Summary (Last 24 hours) at 08/08/2024 1314 Last data filed at 08/08/2024 0700 Gross per 24 hour  Intake 1336.25 ml  Output 2440 ml  Net -1103.75 ml   Filed Weights   08/06/24 1810 08/07/24 0456 08/08/24 0347  Weight: 54.3 kg 55 kg 60 kg     Examination:   General: Appearance:  Elderly frail female in no acute distress     Lungs:     [place] respirations unlabored  Heart:    Normal heart rate.    MS:   All extremities are intact.    Neurologic:   Awake, alert       Data Reviewed: I have personally reviewed following labs and imaging studies  CBC: Recent Labs  Lab 08/06/24 0501 08/06/24 0622 08/06/24 1143 08/06/24 1640 08/07/24 0158 08/07/24 0905 08/07/24 0906 08/07/24 0915 08/07/24 1245 08/08/24 0543  WBC 9.1   < > 10.8* 11.1* 9.9  --   --   --  12.7* 9.7  NEUTROABS 7.8*  --  9.3*  --   --   --   --   --   --   --   HGB 7.8*   < > 7.5* 7.4* 6.9* 7.5* 8.2* 7.8* 8.6* 8.2*  HCT 22.2*   < > 21.7* 22.1* 20.0* 22.0* 24.0* 23.0* 25.7* 23.9*  MCV 89.9   < > 91.2 92.9 92.2  --   --   --  93.1 88.8  PLT 374   < > 389 414* 337  --   --   --  351 333   < > = values in this interval not displayed.   Basic Metabolic Panel: Recent Labs  Lab 08/06/24 0200 08/06/24 0500 08/06/24 1143 08/06/24 1640 08/06/24 2331 08/07/24 0158 08/07/24 0905 08/07/24 0915 08/07/24 1245 08/07/24 1935 08/08/24 0015 08/08/24 0543  NA 122*  --    < > 126*   < > 131*   < > 131* 132* 129* 130* 130*  K 3.4*  --    < > 3.6   < > 3.7   < > 3.2* 3.1* 3.9 3.8 4.1  CL 84*  --    < > 87*   < > 90*  --   --  90* 88* 89* 90*  CO2 26  --    < > 27   < > 29  --   --  30 29 30 31   GLUCOSE 191*  --    < > 213*   < > 160*  --   --  186* 90 137* 102*  BUN 23  --    < > 19   < > 21  --   --  26* 30* 33* 32*  CREATININE 0.94  --    < > 0.95   < > 1.00  --   --  1.05* 1.10* 1.12* 1.02*  CALCIUM  8.9  --    < > 8.0*   < > 7.6*  --   --  7.8* 8.6* 8.7* 9.3  MG 2.3 2.2  --  2.3  --  2.2  --   --   --   --   --  2.1  PHOS 3.0  --   --   --   --   --   --   --   --   --   --   --    < > =  values in this interval not displayed.   GFR: Estimated Creatinine Clearance: 40.6 mL/min (A) (by C-G formula based on SCr of 1.02 mg/dL (H)). Liver Function  Tests: Recent Labs  Lab 08/03/24 0406 08/04/24 0500 08/06/24 1143  AST 28 34 36  ALT 13 17 21   ALKPHOS 57 64 54  BILITOT 0.4 0.4 0.4  PROT 5.1* 5.5* 5.3*  ALBUMIN  3.1* 3.4* 3.2*   No results for input(s): LIPASE, AMYLASE in the last 168 hours. No results for input(s): AMMONIA in the last 168 hours. Coagulation Profile: Recent Labs  Lab 08/06/24 1640  INR 1.1   Cardiac Enzymes: No results for input(s): CKTOTAL, CKMB, CKMBINDEX, TROPONINI in the last 168 hours. BNP (last 3 results) Recent Labs    07/24/24 1016 08/02/24 1551  PROBNP 4,537.0* 5,039.0*   HbA1C: No results for input(s): HGBA1C in the last 72 hours. CBG: Recent Labs  Lab 08/07/24 2006 08/07/24 2356 08/08/24 0430 08/08/24 0818 08/08/24 1132  GLUCAP 95 157* 131* 87 134*   Lipid Profile: No results for input(s): CHOL, HDL, LDLCALC, TRIG, CHOLHDL, LDLDIRECT in the last 72 hours. Thyroid  Function Tests: No results for input(s): TSH, T4TOTAL, FREET4, T3FREE, THYROIDAB in the last 72 hours. Anemia Panel: Recent Labs    08/06/24 0819  FERRITIN 56  TIBC 360  IRON  11*   Sepsis Labs: Recent Labs  Lab 08/02/24 1247 08/05/24 2103 08/06/24 0133 08/06/24 0343  PROCALCITON <0.10  --   --   --   LATICACIDVEN  --  5.7* 2.1* 1.1    Recent Results (from the past 240 hours)  Respiratory (~20 pathogens) panel by PCR     Status: None   Collection Time: 08/02/24 12:34 PM   Specimen: Nasopharyngeal Swab; Respiratory  Result Value Ref Range Status   Adenovirus NOT DETECTED NOT DETECTED Final   Coronavirus 229E NOT DETECTED NOT DETECTED Final    Comment: (NOTE) The Coronavirus on the Respiratory Panel, DOES NOT test for the novel  Coronavirus (2019 nCoV)    Coronavirus HKU1 NOT DETECTED NOT DETECTED Final   Coronavirus NL63 NOT DETECTED NOT DETECTED Final   Coronavirus OC43 NOT DETECTED NOT DETECTED Final   Metapneumovirus NOT DETECTED NOT DETECTED Final   Rhinovirus  / Enterovirus NOT DETECTED NOT DETECTED Final   Influenza A NOT DETECTED NOT DETECTED Final   Influenza B NOT DETECTED NOT DETECTED Final   Parainfluenza Virus 1 NOT DETECTED NOT DETECTED Final   Parainfluenza Virus 2 NOT DETECTED NOT DETECTED Final   Parainfluenza Virus 3 NOT DETECTED NOT DETECTED Final   Parainfluenza Virus 4 NOT DETECTED NOT DETECTED Final   Respiratory Syncytial Virus NOT DETECTED NOT DETECTED Final   Bordetella pertussis NOT DETECTED NOT DETECTED Final   Bordetella Parapertussis NOT DETECTED NOT DETECTED Final   Chlamydophila pneumoniae NOT DETECTED NOT DETECTED Final   Mycoplasma pneumoniae NOT DETECTED NOT DETECTED Final    Comment: Performed at [facility], [address], [place], [place] [address]  Resp panel by RT-PCR (RSV, Flu A&B, Covid) Anterior Nasal Swab     Status: None   Collection Time: 08/02/24 12:34 PM   Specimen: Anterior Nasal Swab  Result Value Ref Range Status   SARS Coronavirus 2 by RT PCR NEGATIVE NEGATIVE Final   Influenza A by PCR NEGATIVE NEGATIVE Final   Influenza B by PCR NEGATIVE NEGATIVE Final    Comment: (NOTE) The Xpert Xpress SARS-CoV-2/FLU/RSV plus assay is intended as an aid in the diagnosis of influenza from Nasopharyngeal swab  specimens and should not be used as a sole basis for treatment. Nasal washings and aspirates are unacceptable for Xpert Xpress SARS-CoV-2/FLU/RSV testing.  Fact Sheet for Patients: bloggercourse.com  Fact Sheet for Healthcare Providers: seriousbroker.it  This test is not yet approved or cleared by the [place]  FDA and has been authorized for detection and/or diagnosis of SARS-CoV-2 by FDA under an Emergency Use Authorization (EUA). This EUA will remain in effect (meaning this test can be used) for the duration of the COVID-19 declaration under Section 564(b)(1) of the Act, 21 U.S.C. section 360bbb-3(b)(1), unless the authorization is  terminated or revoked.     Resp Syncytial Virus by PCR NEGATIVE NEGATIVE Final    Comment: (NOTE) Fact Sheet for Patients: bloggercourse.com  Fact Sheet for Healthcare Providers: seriousbroker.it  This test is not yet approved or cleared by the [place]  FDA and has been authorized for detection and/or diagnosis of SARS-CoV-2 by FDA under an Emergency Use Authorization (EUA). This EUA will remain in effect (meaning this test can be used) for the duration of the COVID-19 declaration under Section 564(b)(1) of the Act, 21 U.S.C. section 360bbb-3(b)(1), unless the authorization is terminated or revoked.  Performed at [facility], [address] [place], [place] [address]   Expectorated Sputum Assessment w Gram Stain, Rflx to Resp Cult     Status: None   Collection Time: 08/04/24 11:48 AM   Specimen: Sputum  Result Value Ref Range Status   Specimen Description SPU  Final   Special Requests SPU  Final   Sputum evaluation   Final    THIS SPECIMEN IS ACCEPTABLE FOR SPUTUM CULTURE Performed at [facility], [address] [place], [place] [address]    Report Status 08/05/2024 FINAL  Final  Culture, Respiratory w Gram Stain     Status: None   Collection Time: 08/04/24 11:48 AM   Specimen: Sputum  Result Value Ref Range Status   Specimen Description SPU  Final   Special Requests SPU Reflexed from [id number]  Final   Gram Stain   Final    RARE WBC SEEN NO ORGANISMS SEEN Performed at [facility], [address] [place], [place] [address]    Culture FEW PSEUDOMONAS AERUGINOSA  Final   Report Status 08/08/2024 FINAL  Final   Organism ID, Bacteria PSEUDOMONAS AERUGINOSA  Final      Susceptibility   Pseudomonas aeruginosa - MIC*    MEROPENEM 2 SENSITIVE Sensitive     CIPROFLOXACIN <=0.06 SENSITIVE Sensitive     IMIPENEM >=16 RESISTANT Resistant     PIP/TAZO Value in next row Sensitive      <=4 SENSITIVEThis  is a modified FDA-approved test that has been validated and its performance characteristics determined by the reporting laboratory.  This laboratory is certified under the Clinical Laboratory Improvement Amendments CLIA as qualified to perform high complexity clinical laboratory testing.    CEFEPIME  Value in next row Sensitive      <=4 SENSITIVEThis is a modified FDA-approved test that has been validated and its performance characteristics determined by the reporting laboratory.  This laboratory is certified under the Clinical Laboratory Improvement Amendments CLIA as qualified to perform high complexity clinical laboratory testing.    CEFTAZIDIME/AVIBACTAM Value in next row Sensitive      <=4 SENSITIVEThis is a modified FDA-approved test that has been validated and its performance characteristics determined by the reporting laboratory.  This laboratory is certified under the Clinical Laboratory Improvement Amendments CLIA as qualified  to perform high complexity clinical laboratory testing.    CEFTOLOZANE/TAZOBACTAM Value in next row Sensitive      <=4 SENSITIVEThis is a modified FDA-approved test that has been validated and its performance characteristics determined by the reporting laboratory.  This laboratory is certified under the Clinical Laboratory Improvement Amendments CLIA as qualified to perform high complexity clinical laboratory testing.    TOBRAMYCIN Value in next row Sensitive      <=4 SENSITIVEThis is a modified FDA-approved test that has been validated and its performance characteristics determined by the reporting laboratory.  This laboratory is certified under the Clinical Laboratory Improvement Amendments CLIA as qualified to perform high complexity clinical laboratory testing.    CEFTAZIDIME Value in next row Sensitive      <=4 SENSITIVEThis is a modified FDA-approved test that has been validated and its performance characteristics determined by the reporting laboratory.  This  laboratory is certified under the Clinical Laboratory Improvement Amendments CLIA as qualified to perform high complexity clinical laboratory testing.    * FEW PSEUDOMONAS AERUGINOSA  MRSA Next Gen by PCR, Nasal     Status: None   Collection Time: 08/04/24 12:30 PM   Specimen: Nasal Mucosa; Nasal Swab  Result Value Ref Range Status   MRSA by PCR Next Gen NOT DETECTED NOT DETECTED Final    Comment: (NOTE) The GeneXpert MRSA Assay (FDA approved for NASAL specimens only), is one component of a comprehensive MRSA colonization surveillance program. It is not intended to diagnose MRSA infection nor to guide or monitor treatment for MRSA infections. Test performance is not FDA approved in patients less than 44 years old. Performed at [facility], [address] [place], [place] [address]   MRSA Next Gen by PCR, Nasal     Status: None   Collection Time: 08/06/24 12:57 AM   Specimen: Nasal Mucosa; Nasal Swab  Result Value Ref Range Status   MRSA by PCR Next Gen NOT DETECTED NOT DETECTED Final    Comment: (NOTE) The GeneXpert MRSA Assay (FDA approved for NASAL specimens only), is one component of a comprehensive MRSA colonization surveillance program. It is not intended to diagnose MRSA infection nor to guide or monitor treatment for MRSA infections. Test performance is not FDA approved in patients less than 36 years old. Performed at [facility], [address] [place], [place] [address]   Respiratory (~20 pathogens) panel by PCR     Status: None   Collection Time: 08/06/24  8:17 AM   Specimen: Nasopharyngeal Swab; Respiratory  Result Value Ref Range Status   Adenovirus NOT DETECTED NOT DETECTED Final   Coronavirus 229E NOT DETECTED NOT DETECTED Final    Comment: (NOTE) The Coronavirus on the Respiratory Panel, DOES NOT test for the novel  Coronavirus (2019 nCoV)    Coronavirus HKU1 NOT DETECTED NOT DETECTED Final   Coronavirus NL63 NOT DETECTED NOT DETECTED Final    Coronavirus OC43 NOT DETECTED NOT DETECTED Final   Metapneumovirus NOT DETECTED NOT DETECTED Final   Rhinovirus / Enterovirus NOT DETECTED NOT DETECTED Final   Influenza A NOT DETECTED NOT DETECTED Final   Influenza B NOT DETECTED NOT DETECTED Final   Parainfluenza Virus 1 NOT DETECTED NOT DETECTED Final   Parainfluenza Virus 2 NOT DETECTED NOT DETECTED Final   Parainfluenza Virus 3 NOT DETECTED NOT DETECTED Final   Parainfluenza Virus 4 NOT DETECTED NOT DETECTED Final   Respiratory Syncytial Virus NOT DETECTED NOT DETECTED Final   Bordetella pertussis NOT DETECTED  NOT DETECTED Final   Bordetella Parapertussis NOT DETECTED NOT DETECTED Final   Chlamydophila pneumoniae NOT DETECTED NOT DETECTED Final   Mycoplasma pneumoniae NOT DETECTED NOT DETECTED Final    Comment: Performed at [facility], [address], [place], [place] [address]  Surgical pcr screen     Status: None   Collection Time: 08/07/24  1:58 AM   Specimen: Nasal Mucosa; Nasal Swab  Result Value Ref Range Status   MRSA, PCR NEGATIVE NEGATIVE Final   Staphylococcus aureus NEGATIVE NEGATIVE Final    Comment: (NOTE) The Xpert SA Assay (FDA approved for NASAL specimens in patients 33 years of age and older), is one component of a comprehensive surveillance program. It is not intended to diagnose infection nor to guide or monitor treatment. Performed at [facility], [address], [place], [place] [address]          Radiology Studies: CARDIAC CATHETERIZATION Result Date: 08/07/2024 1. Filling pressures in normal range. 2. Mild pulmonary hypertension. 3. Cardiac output appears preserved. 4. Good PAPi.   CT ABDOMEN PELVIS WO CONTRAST Result Date: 08/07/2024 EXAM: CT ABDOMEN AND PELVIS WITHOUT CONTRAST 08/07/2024 06:48:00 AM TECHNIQUE: CT of the abdomen and pelvis was performed without the administration of intravenous contrast. Multiplanar reformatted images are provided for review. Automated exposure  control, iterative reconstruction, and/or weight-based adjustment of the mA/kV was utilized to reduce the radiation dose to as low as reasonably achievable. COMPARISON: CTA abdomen and pelvis 07/05/2024. CLINICAL HISTORY: 74 year old female with recent right lower extremity vascular bypass due to limb ischemia, recurrent ventricular tachycardia, and respiratory distress. FINDINGS: LOWER CHEST: Partially visible cardiac pacemaker or ICD lead. Heart size remains normal. No pericardial effusion. Bilateral lung bases pleural effusion with simple fluid attenuation favoring transudate. Pleural fluid volume moderate, left greater than right, with bilateral significant lower lobe compressive atelectasis superimposed on diffuse emphysema. Early consolidation in both lower lobes. LIVER: The liver is unremarkable. GALLBLADDER AND BILE DUCTS: Gallbladder is unremarkable. No biliary ductal dilatation. SPLEEN: No acute abnormality. PANCREAS: No acute abnormality. ADRENAL GLANDS: No acute abnormality. KIDNEYS, URETERS AND BLADDER: Renal vascular calcifications. Nephrolithiasis felt less likely. Foley catheter decompresses the urinary bladder. No stones in the kidneys or ureters. No hydronephrosis. No perinephric or periureteral stranding. Urinary bladder is unremarkable. GI AND BOWEL: Redundant large bowel containing gas and stool similar to the CTA 07/05/2024. Fluid containing but nondilated small bowel loops. Mostly decompressed stomach. There is no bowel obstruction. PERITONEUM AND RETROPERITONEUM: No free fluid identified in the abdomen or pelvis. No free air. VASCULATURE: Aorta is normal in caliber. Severe diffuse calcified atherosclerosis, including subtotal occlusion of the proximal abdominal aorta on CTA 07/05/2024 (series 3 image 21 now), and vascular patency is not evaluated in the absence of IV contrast. New elongated right lateral body wall vascular bypass graft which bifurcates inferior to the umbilicus and  communicates with the bilateral femoral arteries. Graft patency not evaluated in the absence of IV contrast. LYMPH NODES: No lymphadenopathy. REPRODUCTIVE ORGANS: Incidental calcified pelvic phleboliths. No acute abnormality. BONES AND SOFT TISSUES: Osteopenia with stable lumbar spine degenerative change. Largely new from 07/05/2024 moderate to severe dependent body wall fluid or edema (series 3 image 40), mildly asymmetric greater on the left. No obvious body wall hematoma. Along the bypass graft no obvious hematoma on this non-contrast exam except for possible small oval subcutaneous collection superficial to the right femoral artery bifurcation on series 3 image 79, medial to skin staples there and encompassing 23 x  14 x 28 mm (AP x transverse x CC). Estimated volume 5 mL. No superficial soft tissue gas. No acute osseous abnormality. No focal soft tissue abnormality. IMPRESSION: 1. Moderate to severe mostly dependent body wall swelling/edema, but favor Anasarca (see #2), and no discrete body wall or post-operative hematoma except for a small and inconsequential appearing possible hematoma collection at the right groin (2.8 cm, estimated 5 mL). 2. Moderate bilateral layering pleural effusions with compressive lower lobe atelectasis, early consolidation. Underlying emphysema 3. Very Severe calcified atherosclerosis, and new long segment right chest through bilateral lower abdominal wall vascular bypass grafts. Patency not evaluated in the absence of IV contrast. Electronically signed by: Helayne Hurst MD 08/07/2024 07:23 AM EST RP Workstation: [id number]        Scheduled Meds:  acetylcysteine   4 mL Nebulization TID   amiodarone   200 mg Oral BID   arformoterol   15 mcg Nebulization BID   aspirin  EC  81 mg Oral Q0600   atorvastatin   80 mg Oral Daily   Chlorhexidine  Gluconate Cloth  6 each Topical Daily   feeding supplement  237 mL Oral BID BM   furosemide   20 mg Oral Daily   insulin  aspart  0-15 Units  Subcutaneous Q4H   ipratropium-albuterol   3 mL Nebulization Q4H   levothyroxine   37.5 mcg Oral Q0600   mirtazapine   7.5 mg Oral QHS   mouth rinse  15 mL Mouth Rinse 4 times per day   ranolazine   1,000 mg Oral BID   revefenacin   175 mcg Nebulization Daily   sacubitril -valsartan   1 tablet Oral BID   sodium chloride  flush  10-40 mL Intracatheter Q12H   sodium chloride  flush  3 mL Intravenous Q12H   spironolactone   25 mg Oral Daily   valACYclovir   1,000 mg Oral Daily   Continuous Infusions:  iron  sucrose (VENOFER ) 500 mg in sodium chloride  0.9 % 250 mL IVPB 500 mg (08/08/24 1136)   piperacillin -tazobactam (ZOSYN )  IV 3.375 g (08/08/24 0501)     LOS: 15 days    Time spent: 45 minutes spent on chart review, discussion with nursing staff, consultants, updating family and interview/physical exam; more than 50% of that time was spent in counseling and/or coordination of care.    Harlene RAYMOND Bowl, DO [facility] Available via Epic secure chat 7am-7pm After these hours, please refer to coverage provider listed on [email or web address] 08/08/2024, 1:14 PM   "

## 2024-08-08 NOTE — Progress Notes (Signed)
" °  Progress Note    08/08/2024 8:19 AM 1 Day Post-Op  Subjective: States she feels better than yesterday  Tm 100 now 99.3 HR 80's-90's  90's-150's systolic 100% [id number]  Vitals:   08/08/24 0347 08/08/24 0421  BP: (!) 138/56   Pulse: 87   Resp: 20   Temp: 98.3 F (36.8 C)   SpO2: 96% 97%    Physical Exam: General:  no distress; appears comfortable Cardiac:  regular Lungs:  non labored Incisions:  right chest and left groin incisions look good.  Right groin with prevena and minimal drainage in canister Extremities:  brisk doppler flow right pero and PT and left PT and DP.     CBC    Component Value Date/Time   WBC 9.7 08/08/2024 0543   RBC 2.69 (L) 08/08/2024 0543   HGB 8.2 (L) 08/08/2024 0543   HGB 13.6 06/11/2024 1039   HCT 23.9 (L) 08/08/2024 0543   HCT 39.0 06/11/2024 1039   PLT 333 08/08/2024 0543   PLT 335 06/11/2024 1039   MCV 88.8 08/08/2024 0543   MCV 102 (H) 06/11/2024 1039   MCH 30.5 08/08/2024 0543   MCHC 34.3 08/08/2024 0543   RDW 16.2 (H) 08/08/2024 0543   RDW 13.1 06/11/2024 1039   LYMPHSABS 0.5 (L) 08/06/2024 1143   LYMPHSABS 2.9 08/25/2016 1648   MONOABS 1.0 08/06/2024 1143   EOSABS 0.0 08/06/2024 1143   EOSABS 0.2 08/25/2016 1648   BASOSABS 0.0 08/06/2024 1143   BASOSABS 0.0 08/25/2016 1648    BMET    Component Value Date/Time   NA 130 (L) 08/08/2024 0543   NA 132 (L) 06/11/2024 1039   K 4.1 08/08/2024 0543   CL 90 (L) 08/08/2024 0543   CO2 31 08/08/2024 0543   GLUCOSE 102 (H) 08/08/2024 0543   BUN 32 (H) 08/08/2024 0543   BUN 15 06/11/2024 1039   CREATININE 1.02 (H) 08/08/2024 0543   CREATININE 1.13 (H) 05/30/2024 0851   CALCIUM  9.3 08/08/2024 0543   GFRNONAA 58 (L) 08/08/2024 0543   GFRAA 78 08/19/2019 0830    INR    Component Value Date/Time   INR 1.1 08/06/2024 1640     Intake/Output Summary (Last 24 hours) at 08/08/2024 0819 Last data filed at 08/08/2024 0400 Gross per 24 hour  Intake 1532.43 ml  Output 2790 ml  Net  -1257.57 ml      Assessment/Plan:  74 y.o. female is s/p:  R axillary bifemoral bypass 07/29/2024 by Dr. Lanis for BLE CLI with rest pain.  Admission has been complicated by fluid overload and respiratory insufficiency that was present prior to the operation.  Appreciate excellent care from heart failure team Will continue to follow intermittently Would appreciate ASA, statin for the time being.  Once hemoglobin is stable over several days, will add Plavix  back to her medication regimen. Needs continued aggressive diuresis.    Appreciate CIR consideration       "

## 2024-08-08 NOTE — Progress Notes (Signed)
 Patient has developed hemoptysis. Supplemental O2 requirement has increased. No other new signs/symptoms. Plan to check CTA chest.

## 2024-08-08 NOTE — Progress Notes (Signed)
 "    Advanced Heart Failure Rounding Note  Cardiologist: Lonni Cash, MD  [facility] Cardiologist: Establishing Dr. Rolan  Patient Profile   Pam [name] is a 74 y.o. female with  history of chronic HFrEF/mixed cardiomyopathy, CAD, PAD, chronic hyponatremia, COPD.    Admitted with acute respiratory failure, acute on chronic CHF and hyponatremia.  Significant Events:    1/5: started on Milrinone  for low co-ox, 53% 1/5: moved to CCU for acute hypoxic respiratory failure requiring Bipap, ?seizure like activity. CXR w/ pulm edema. Runs of VT treated w/ ICD shocks, milrinone  d/ced  (K 3.2, Mg 1.6)   Subjective:    Co-ox 46%. However, thermo and fick cardiac outputs were preserved on RHC yesterday.   CVP 8. Lasix  gtt stopped last night.  Remains on 4L O2 [place].  Reports she got to chair yesterday and ambulated a few steps in her room.  Objective:   Weight Range: 60 kg Body mass index is 23.43 kg/m.   Vital Signs:   Temp:  [97.7 F (36.5 C)-99.5 F (37.5 C)] 98.3 F (36.8 C) (01/08 0347) Pulse Rate:  [76-126] 87 (01/08 0347) Resp:  [8-25] 20 (01/08 0347) BP: (62-144)/(27-95) 138/56 (01/08 0347) SpO2:  [89 %-100 %] 97 % (01/08 0421) Weight:  [60 kg] 60 kg (01/08 0347) Last BM Date : 08/06/24  Weight change: Filed Weights   08/06/24 1810 08/07/24 0456 08/08/24 0347  Weight: 54.3 kg 55 kg 60 kg    Intake/Output:   Intake/Output Summary (Last 24 hours) at 08/08/2024 0706 Last data filed at 08/08/2024 0400 Gross per 24 hour  Intake 1566.31 ml  Output 2920 ml  Net -1353.69 ml     Physical Exam   General:  Frail. Chronically ill appearing Cor: JVP 8-10. Regular rate & rhythm. No murmurs. Lungs: + crackles, mildly increased work of breathing with pursed lip breathing Extremities: 1-2+ generalized edema Neuro: alert & orientedx3. Affect pleasant   Telemetry   SR 70s, no VT   Labs   CBC Recent Labs    08/06/24 0501 08/06/24 0622 08/06/24 1143 08/06/24 1640  08/07/24 1245 08/08/24 0543  WBC 9.1   < > 10.8*   < > 12.7* 9.7  NEUTROABS 7.8*  --  9.3*  --   --   --   HGB 7.8*   < > 7.5*   < > 8.6* 8.2*  HCT 22.2*   < > 21.7*   < > 25.7* 23.9*  MCV 89.9   < > 91.2   < > 93.1 88.8  PLT 374   < > 389   < > 351 333   < > = values in this interval not displayed.   Basic Metabolic Panel Recent Labs    98/93/73 0200 08/06/24 0500 08/07/24 0158 08/07/24 0905 08/08/24 0015 08/08/24 0543  NA 122*   < > 131*   < > 130* 130*  K 3.4*   < > 3.7   < > 3.8 4.1  CL 84*   < > 90*   < > 89* 90*  CO2 26   < > 29   < > 30 31  GLUCOSE 191*   < > 160*   < > 137* 102*  BUN 23   < > 21   < > 33* 32*  CREATININE 0.94   < > 1.00   < > 1.12* 1.02*  CALCIUM  8.9   < > 7.6*   < > 8.7* 9.3  MG 2.3   < >  2.2  --   --  2.1  PHOS 3.0  --   --   --   --   --    < > = values in this interval not displayed.   Liver Function Tests Recent Labs    08/06/24 1143  AST 36  ALT 21  ALKPHOS 54  BILITOT 0.4  PROT 5.3*  ALBUMIN  3.2*   No results for input(s): LIPASE, AMYLASE in the last 72 hours. Cardiac Enzymes No results for input(s): CKTOTAL, CKMB, CKMBINDEX, TROPONINI in the last 72 hours.  BNP: BNP (last 3 results) Recent Labs    11/22/23 1129 03/26/24 1423 05/29/24 1206  BNP 472.0* 614.1* 841.1*    ProBNP (last 3 results) Recent Labs    07/24/24 1016 08/02/24 1551  PROBNP 4,537.0* 5,039.0*     D-Dimer No results for input(s): DDIMER in the last 72 hours. Hemoglobin A1C No results for input(s): HGBA1C in the last 72 hours. Fasting Lipid Panel No results for input(s): CHOL, HDL, LDLCALC, TRIG, CHOLHDL, LDLDIRECT in the last 72 hours. Medications:   Scheduled Medications:  acetylcysteine   4 mL Nebulization TID   amiodarone   200 mg Oral BID   arformoterol   15 mcg Nebulization BID   aspirin  EC  81 mg Oral Q0600   atorvastatin   80 mg Oral Daily   Chlorhexidine  Gluconate Cloth  6 each Topical Daily   feeding  supplement  237 mL Oral BID BM   insulin  aspart  0-15 Units Subcutaneous Q4H   ipratropium-albuterol   3 mL Nebulization Q4H   levothyroxine   37.5 mcg Oral Q0600   mirtazapine   7.5 mg Oral QHS   mouth rinse  15 mL Mouth Rinse 4 times per day   ranolazine   1,000 mg Oral BID   revefenacin   175 mcg Nebulization Daily   sodium chloride  flush  10-40 mL Intracatheter Q12H   sodium chloride  flush  3 mL Intravenous Q12H   spironolactone   25 mg Oral Daily   valACYclovir   1,000 mg Oral Daily    Infusions:  piperacillin -tazobactam (ZOSYN )  IV 3.375 g (08/08/24 0501)    PRN Medications: acetaminophen , albuterol , calcium  carbonate, guaiFENesin -dextromethorphan, HYDROmorphone  (DILAUDID ) injection, ondansetron  (ZOFRAN ) IV, mouth rinse, oxyCODONE -acetaminophen , phenol, polyethylene glycol, senna, sodium chloride  flush, sodium chloride  flush  Assessment/Plan   1. Acute hypoxemic respiratory failure: Patient is a prior smoker with h/o COPD but has not been on home oxygen. Admit CXR with bilateral lower lobe infiltrates, but procalcitonin has not been elevated.  Respiratory virus panel negative.  Concern for COPD exacerbation.  Volume overload also thought to contribute. Improved w/ diuresis and treatment of COPD. Off Bipap. On 4L [place]. - Empiric Zosyn  and nebs per primary  2. Acute on chronic systolic CHF: Ischemic cardiomyopathy, has MDT ICD.  Echo this admission with EF 30-35%, WMAs, normal RV, IVC not dilated. Was started on milrinone  with low co-ox but this was stopped due to VT.    - RHC 1/7 w/ preserved CO and normal filling pressures - CVP 8. Start po lasix  20 daily (on PRN at home). Has generalized edema but intravascular volume is low - Spironolactone  25 mg daily.  - Start entresto  24/26 mg BID - Off SGLT2i with history of urinary incontinence - Could consider low dose bisoprolol  next   3. CAD: NSTEMI 12/17, CTO LAD with collaterals and severe pLCx/OM1 stenosis on cath.  She had PCI to pLCx and  OM1.  No chest pain.  - Continue ASA 81 - Continue statin.  - Continue  ranolazine   4. Hyponatremia: This appears chronic, Na in upper 120s -130 range generally, has been seen by endocrinology in the past.  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  ?Baseline SIADH with additional component of hypervolemic hyponatremia this admission vs worsening SIADH due to COPD exacerbation/lung infection. Tolvaptan  given 1/2 and 1/6.  - NA improved, 130 today - Diuretics as above  5. PAD: S/p R sided axillobifemoral bypass using PTFE and R CFA endarterectomy this admission.  - ASA - Statin - VVS following.   6. Post-op anemia/ IDA  - CT of A/P 1/7 w/ very small R groin hematoma, no significant bleed - 1 u RBCs 1/7 - Fe studies c/w IDA, Tsat 3%. Will review IV iron  with PharmD given recent transfusion  7. VT: Multiple runs with ICD shocks in setting of low K and Mg and milrinone  initiation.  Now off milrinone . No further VT. K and Mg now replete  - Continue po amiodarone  200 BID  Length of Stay: 15  Junelle Hashemi N, PA-C  08/08/2024, 7:06 AM  Advanced Heart Failure Team Pager [phone] (M-F; 7a - 5p)   Please visit [email or web address]: For overnight coverage please call cardiology fellow first. If fellow not available call Shock/ECMO MD on call.  For ECMO / Mechanical Support (Impella, IABP, LVAD) issues call Shock / ECMO MD on call.        "

## 2024-08-09 ENCOUNTER — Inpatient Hospital Stay (HOSPITAL_COMMUNITY)

## 2024-08-09 DIAGNOSIS — M7989 Other specified soft tissue disorders: Secondary | ICD-10-CM

## 2024-08-09 DIAGNOSIS — I5022 Chronic systolic (congestive) heart failure: Secondary | ICD-10-CM

## 2024-08-09 DIAGNOSIS — I5020 Unspecified systolic (congestive) heart failure: Secondary | ICD-10-CM | POA: Diagnosis not present

## 2024-08-09 DIAGNOSIS — Z87891 Personal history of nicotine dependence: Secondary | ICD-10-CM

## 2024-08-09 DIAGNOSIS — R042 Hemoptysis: Secondary | ICD-10-CM

## 2024-08-09 DIAGNOSIS — J439 Emphysema, unspecified: Secondary | ICD-10-CM

## 2024-08-09 DIAGNOSIS — I110 Hypertensive heart disease with heart failure: Principal | ICD-10-CM

## 2024-08-09 LAB — CBC
HCT: 25.1 % — ABNORMAL LOW (ref 36.0–46.0)
Hemoglobin: 8.5 g/dL — ABNORMAL LOW (ref 12.0–15.0)
MCH: 30.7 pg (ref 26.0–34.0)
MCHC: 33.9 g/dL (ref 30.0–36.0)
MCV: 90.6 fL (ref 80.0–100.0)
Platelets: 340 K/uL (ref 150–400)
RBC: 2.77 MIL/uL — ABNORMAL LOW (ref 3.87–5.11)
RDW: 16.1 % — ABNORMAL HIGH (ref 11.5–15.5)
WBC: 10.9 K/uL — ABNORMAL HIGH (ref 4.0–10.5)
nRBC: 0.3 % — ABNORMAL HIGH (ref 0.0–0.2)

## 2024-08-09 LAB — BASIC METABOLIC PANEL WITH GFR
Anion gap: 10 (ref 5–15)
BUN: 30 mg/dL — ABNORMAL HIGH (ref 8–23)
CO2: 29 mmol/L (ref 22–32)
Calcium: 9.4 mg/dL (ref 8.9–10.3)
Chloride: 92 mmol/L — ABNORMAL LOW (ref 98–111)
Creatinine, Ser: 0.96 mg/dL (ref 0.44–1.00)
GFR, Estimated: >60 mL/min
Glucose, Bld: 109 mg/dL — ABNORMAL HIGH (ref 70–99)
Potassium: 4.7 mmol/L (ref 3.5–5.1)
Sodium: 130 mmol/L — ABNORMAL LOW (ref 135–145)

## 2024-08-09 LAB — EXPECTORATED SPUTUM ASSESSMENT W GRAM STAIN, RFLX TO RESP C

## 2024-08-09 LAB — PRO BRAIN NATRIURETIC PEPTIDE: Pro Brain Natriuretic Peptide: 8218 pg/mL — ABNORMAL HIGH

## 2024-08-09 LAB — GLUCOSE, CAPILLARY
Glucose-Capillary: 106 mg/dL — ABNORMAL HIGH (ref 70–99)
Glucose-Capillary: 112 mg/dL — ABNORMAL HIGH (ref 70–99)
Glucose-Capillary: 118 mg/dL — ABNORMAL HIGH (ref 70–99)
Glucose-Capillary: 136 mg/dL — ABNORMAL HIGH (ref 70–99)
Glucose-Capillary: 93 mg/dL (ref 70–99)

## 2024-08-09 LAB — MAGNESIUM: Magnesium: 2 mg/dL (ref 1.7–2.4)

## 2024-08-09 MED ORDER — FUROSEMIDE 10 MG/ML IJ SOLN
40.0000 mg | Freq: Two times a day (BID) | INTRAMUSCULAR | Status: AC
  Administered 2024-08-09 (×2): 40 mg via INTRAVENOUS
  Filled 2024-08-09 (×3): qty 4

## 2024-08-09 MED ORDER — IPRATROPIUM-ALBUTEROL 0.5-2.5 (3) MG/3ML IN SOLN
3.0000 mL | Freq: Three times a day (TID) | RESPIRATORY_TRACT | Status: DC
  Administered 2024-08-10 – 2024-08-11 (×4): 3 mL via RESPIRATORY_TRACT
  Filled 2024-08-09 (×4): qty 3

## 2024-08-09 NOTE — Progress Notes (Signed)
 UE duplex: positive for DVT in the left axillary vein and positive for an SVT in the cephalic vein.  In light of hemoptysis will not use anticoagulation yet-- elevate extremity Harlene Bowl DO

## 2024-08-09 NOTE — Progress Notes (Addendum)
 "    Advanced Heart Failure Rounding Note  Cardiologist: Lonni Cash, MD  [facility] Cardiologist: Dr. Zenaida  Patient Profile   Pam [name] is a 74 y.o. female with  history of chronic HFrEF/mixed cardiomyopathy, CAD, PAD, chronic hyponatremia, COPD.    Admitted with acute respiratory failure, acute on chronic CHF and hyponatremia.  Significant Events:    1/5: started on Milrinone  for low co-ox, 53% 1/5: moved to CCU for acute hypoxic respiratory failure requiring Bipap, ?seizure like activity. CXR w/ pulm edema. Runs of VT treated w/ ICD shocks, milrinone  d/ced   Subjective:    Developed hemoptysis and O2 requirement increased overnight. CTA negative for PE, but there was airspace and interstitial infiltrates throughout, b/l pleural effusions and evidence of emphysema  Feeling a little better. Currently on 7L HFNC.   CVP 5.   Objective:   Weight Range: 62.1 kg Body mass index is 24.25 kg/m.   Vital Signs:   Temp:  [97.5 F (36.4 C)-98.6 F (37 C)] 97.5 F (36.4 C) (01/09 0351) Pulse Rate:  [77-88] 77 (01/09 0351) Resp:  [18-20] 18 (01/09 0351) BP: (108-137)/(46-59) 108/46 (01/09 0351) SpO2:  [90 %-100 %] 93 % (01/09 0351) Weight:  [62.1 kg] 62.1 kg (01/09 0351) Last BM Date : 08/07/23  Weight change: Filed Weights   08/07/24 0456 08/08/24 0347 08/09/24 0351  Weight: 55 kg 60 kg 62.1 kg    Intake/Output:   Intake/Output Summary (Last 24 hours) at 08/09/2024 0742 Last data filed at 08/09/2024 0519 Gross per 24 hour  Intake 240 ml  Output 1375 ml  Net -1135 ml     Physical Exam   General:  Frail, ill appearing Neck: No JVD. Cor: Regular rate & rhythm. No murmurs. Lungs: Diminished R base, + expiratory wheezes Abdomen: soft, nontender, nondistended. Extremities: LUE edema, no LE edema Neuro: alert & orientedx3. Affect pleasant    Telemetry   SR 70s, no VT  Labs   CBC Recent Labs    08/06/24 1143 08/06/24 1640 08/08/24 0543 08/09/24 0520   WBC 10.8*   < > 9.7 10.9*  NEUTROABS 9.3*  --   --   --   HGB 7.5*   < > 8.2* 8.5*  HCT 21.7*   < > 23.9* 25.1*  MCV 91.2   < > 88.8 90.6  PLT 389   < > 333 340   < > = values in this interval not displayed.   Basic Metabolic Panel Recent Labs    98/91/73 0543 08/09/24 0520  NA 130* 130*  K 4.1 4.7  CL 90* 92*  CO2 31 29  GLUCOSE 102* 109*  BUN 32* 30*  CREATININE 1.02* 0.96  CALCIUM  9.3 9.4  MG 2.1 2.0   Liver Function Tests Recent Labs    08/06/24 1143  AST 36  ALT 21  ALKPHOS 54  BILITOT 0.4  PROT 5.3*  ALBUMIN  3.2*   No results for input(s): LIPASE, AMYLASE in the last 72 hours. Cardiac Enzymes No results for input(s): CKTOTAL, CKMB, CKMBINDEX, TROPONINI in the last 72 hours.  BNP: BNP (last 3 results) Recent Labs    11/22/23 1129 03/26/24 1423 05/29/24 1206  BNP 472.0* 614.1* 841.1*    ProBNP (last 3 results) Recent Labs    07/24/24 1016 08/02/24 1551  PROBNP 4,537.0* 5,039.0*     D-Dimer No results for input(s): DDIMER in the last 72 hours. Hemoglobin A1C No results for input(s): HGBA1C in the last 72 hours. Fasting Lipid Panel No  results for input(s): CHOL, HDL, LDLCALC, TRIG, CHOLHDL, LDLDIRECT in the last 72 hours. Medications:   Scheduled Medications:  acetylcysteine   4 mL Nebulization TID   amiodarone   200 mg Oral BID   arformoterol   15 mcg Nebulization BID   aspirin  EC  81 mg Oral Q0600   atorvastatin   80 mg Oral Daily   Chlorhexidine  Gluconate Cloth  6 each Topical Daily   feeding supplement  237 mL Oral BID BM   furosemide   20 mg Oral Daily   insulin  aspart  0-6 Units Subcutaneous TID WC   ipratropium-albuterol   3 mL Nebulization Q4H   levothyroxine   37.5 mcg Oral Q0600   mirtazapine   7.5 mg Oral QHS   mouth rinse  15 mL Mouth Rinse 4 times per day   ranolazine   1,000 mg Oral BID   revefenacin   175 mcg Nebulization Daily   sacubitril -valsartan   1 tablet Oral BID   sodium chloride  flush  10-40  mL Intracatheter Q12H   sodium chloride  flush  3 mL Intravenous Q12H   spironolactone   25 mg Oral Daily   valACYclovir   1,000 mg Oral Daily    Infusions:  piperacillin -tazobactam (ZOSYN )  IV 3.375 g (08/09/24 0504)    PRN Medications: acetaminophen , albuterol , calcium  carbonate, guaiFENesin -dextromethorphan, HYDROmorphone  (DILAUDID ) injection, iohexol , ondansetron  (ZOFRAN ) IV, mouth rinse, oxyCODONE -acetaminophen , phenol, polyethylene glycol, senna, sodium chloride  flush, sodium chloride  flush  Assessment/Plan   1. Acute hypoxemic respiratory failure: Patient is a prior smoker with h/o COPD but has not been on home oxygen. Admit CXR with bilateral lower lobe infiltrates, but procalcitonin has not been elevated.  Respiratory virus panel negative.  Concern for COPD exacerbation.  Volume overload also thought to contribute. Improved w/ diuresis and treatment of COPD. Off Bipap.  - Empiric Zosyn , nebs per primary - Respiratory status worsened overnight and developed hemoptysis. CTA with no PE, diffuse infiltrates and pleural effusions - Normal filling pressures on RHC 2 days ago. Volume status appears low on exam and by labs. Hold off on increasing po lasix . - Discussed with Dr. Zenaida. Will ask Pulmonary to see again today to see if there is anything else we can do to optimize respiratory status. ? If thoracentesis would improve symptoms/hypoxia  2. Acute on chronic systolic CHF: Ischemic cardiomyopathy, has MDT ICD.  Echo this admission with EF 30-35%, WMAs, normal RV, IVC not dilated. Was started on milrinone  with low co-ox but this was stopped due to VT.    - RHC 1/7 w/ preserved CO and normal filling pressures - CVP 5. Continue po lasix  20 daily (on PRN at home). Has generalized edema but intravascular volume is low - Spironolactone  25 mg daily.  - Continue entresto  24/26 mg BID - Off SGLT2i with history of urinary incontinence  3. CAD: NSTEMI 12/17, CTO LAD with collaterals and severe  pLCx/OM1 stenosis on cath.  She had PCI to pLCx and OM1.  No chest pain.  - Continue ASA 81 - Continue statin.  - Continue ranolazine   4. Hyponatremia: This appears chronic, Na in upper 120s -130 range generally, has been seen by endocrinology in the past.  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  ?Baseline SIADH with additional component of hypervolemic hyponatremia this admission vs worsening SIADH due to COPD exacerbation/lung infection. Tolvaptan  given 1/2 and 1/6.  - NA improved, 130 - Diuretics as above  5. PAD: S/p R sided axillobifemoral bypass using PTFE and R CFA endarterectomy this admission.  - ASA. VVS planning to add palvix back once hemoglobin more  stable. - Statin  6. Post-op anemia/ IDA  - CT of A/P 1/7 w/ very small R groin hematoma, no significant bleed - 1 u RBCs 1/7 - Hgb stable 8.5 - Fe studies c/w IDA, Tsat 3%. Received IV iron .  7. VT: Multiple runs with ICD shocks in setting of low K and Mg and milrinone  initiation.  Now off milrinone . No further VT. K and Mg now replete  - Continue po amiodarone  200 BID  8. LUE swelling: Check venous duplex  Length of Stay: 16  Zairah Arista N, PA-C  08/09/2024, 7:42 AM  Advanced Heart Failure Team Pager [phone] (M-F; 7a - 5p)   Please visit [email or web address]: For overnight coverage please call cardiology fellow first. If fellow not available call Shock/ECMO MD on call.  For ECMO / Mechanical Support (Impella, IABP, LVAD) issues call Shock / ECMO MD on call.        "

## 2024-08-09 NOTE — Progress Notes (Signed)
 " PROGRESS NOTE    Pam [name]  FMW:[id number] DOB: 06-30-1951 DOA: 07/24/2024 PCP: Andrew Truman GRADE., MD    Brief Narrative:  74 y.o. 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  Admission has been complicated by fluid overload, hypotension and respiratory insufficiency.  On 1/5 evening hrs had 4 episodes of VT triggering ICD shocks. Administered additional amiodarone  bolus. Milrinone  stopped. K 3.2 , Mg 1.6. has chronic LAD. W/ mild ST elevation. During events there was change in mental status with concern for possible seizure however this was felt primarily to be syncopal event associated with the VT.  Transferred to [facility] on 1/8. 1/9-hemoptysis-PCCM reconsulted    Assessment and Plan:  Acute hypoxic respiratory failure secondary to diffuse pulmonary infiltrates consistent with Worsening pulmonary edema, complicated further by small bilateral pleural effusions, +/- pneumonia -improving  - Required increasing amount of oxygen overnight-CTA is suggestive of pleural effusion/fluid but no pulmonary emboli -continue scheduled bronchodilators -Currently day #6/7 Zosyn  (sputum culture showing Pseudomonas)-repeat culture   Acute on chronic systolic heart failure.  Baseline EF 30 to 35%, current EF on 1 1 remains the same at 130 to 35%.  There is left ventricular regional wall abnormalities RV size moderately enlarged with moderately enlarged pulmonary artery systolic pressures Continue telemetry monitoring Aggressive diuresis -Per cardiology team   Acute on Chronic Anemia etiology unclear Monitor Hb. -Status post PRBC   History of known coronary artery disease with chronically occluded LAD.  Currently with ST elevation in V1 and V2, with elevated troponin.  Troponin is flat.  Likely demand ischemia.   -Defer to cardiology   VT.  Status post ICD shock x 4 amiodarone  infusion changed to p.o. Ensure potassium greater than 4 and  magnesium  greater than 2 Telemetry monitoring -Defer to cardiology   Lactic acidosis. -resolved   Brief change in mental status-delirium  CT head shows nothing acute.  EEG shows no seizures. -Appears to be back to baseline   Acute on chronic hyponatremia.  Suspect secondary to heart failure. Status post tolvaptan  on 08/06/2024. Daily labs    Hypokalemia, hypochloremia, Hypomagnesemia. -replete   Hypothyroidism Continuing Synthroid    Critical limb ischemia status post right sided axillobifemoral bypass with right common femoral endarterectomy on 12/29 Mild bleeding for heparin  shot from left arm -Aspirin  as well as statin - Would like to resume Plavix  once bleeding stabilized     Hyperglycemia. -SSI  Pressure ulcers Wound 07/24/24 1830 Pressure Injury Sacrum Mid Stage 3 -  Full thickness tissue loss. Subcutaneous fat may be visible but bone, tendon or muscle are NOT exposed. (Active)     Wound 08/05/24 Pressure Injury Buttocks Right;Left Stage 3 -  Full thickness tissue loss. Subcutaneous fat may be visible but bone, tendon or muscle are NOT exposed. (Active)     ? CIR  DVT prophylaxis: SCDs Start: 08/05/24 2136 PLACE TED HOSE Start: 07/31/24 1415    Code Status: Full Code   Disposition Plan:  Level of care: Progressive Status is: Inpatient     Consultants:  Cardiology PCCM Neurology Vascular   Subjective: Had episodes of hemoptysis overnight with increased need for oxygen   Objective: Vitals:   08/09/24 0013 08/09/24 0351 08/09/24 0747 08/09/24 1103  BP: (!) 125/59 (!) 108/46 (!) 109/57   Pulse: 81 77    Resp: 20 18    Temp: 97.7 F (36.5 C) (!) 97.5 F (36.4 C)  97.6 F (36.4 C)  TempSrc: Oral  Oral  Oral  SpO2: 90% 93%    Weight:  62.1 kg    Height:        Intake/Output Summary (Last 24 hours) at 08/09/2024 1354 Last data filed at 08/09/2024 1300 Gross per 24 hour  Intake 240 ml  Output 1725 ml  Net -1485 ml   Filed Weights   08/07/24  0456 08/08/24 0347 08/09/24 0351  Weight: 55 kg 60 kg 62.1 kg    Examination:   General: Appearance:  Elderly frail female in no acute distress     Lungs:   Diminished  Heart:    Normal heart rate.    MS:   All extremities are intact.    Neurologic:   Awake, alert       Data Reviewed: I have personally reviewed following labs and imaging studies  CBC: Recent Labs  Lab 08/06/24 0501 08/06/24 0622 08/06/24 1143 08/06/24 1640 08/07/24 0158 08/07/24 0905 08/07/24 0906 08/07/24 0915 08/07/24 1245 08/08/24 0543 08/09/24 0520  WBC 9.1   < > 10.8* 11.1* 9.9  --   --   --  12.7* 9.7 10.9*  NEUTROABS 7.8*  --  9.3*  --   --   --   --   --   --   --   --   HGB 7.8*   < > 7.5* 7.4* 6.9*   < > 8.2* 7.8* 8.6* 8.2* 8.5*  HCT 22.2*   < > 21.7* 22.1* 20.0*   < > 24.0* 23.0* 25.7* 23.9* 25.1*  MCV 89.9   < > 91.2 92.9 92.2  --   --   --  93.1 88.8 90.6  PLT 374   < > 389 414* 337  --   --   --  351 333 340   < > = values in this interval not displayed.   Basic Metabolic Panel: Recent Labs  Lab 08/06/24 0200 08/06/24 0500 08/06/24 1143 08/06/24 1640 08/06/24 2331 08/07/24 0158 08/07/24 0905 08/07/24 1245 08/07/24 1935 08/08/24 0015 08/08/24 0543 08/09/24 0520  NA 122*  --    < > 126*   < > 131*   < > 132* 129* 130* 130* 130*  K 3.4*  --    < > 3.6   < > 3.7   < > 3.1* 3.9 3.8 4.1 4.7  CL 84*  --    < > 87*   < > 90*  --  90* 88* 89* 90* 92*  CO2 26  --    < > 27   < > 29  --  30 29 30 31 29   GLUCOSE 191*  --    < > 213*   < > 160*  --  186* 90 137* 102* 109*  BUN 23  --    < > 19   < > 21  --  26* 30* 33* 32* 30*  CREATININE 0.94  --    < > 0.95   < > 1.00  --  1.05* 1.10* 1.12* 1.02* 0.96  CALCIUM  8.9  --    < > 8.0*   < > 7.6*  --  7.8* 8.6* 8.7* 9.3 9.4  MG 2.3 2.2  --  2.3  --  2.2  --   --   --   --  2.1 2.0  PHOS 3.0  --   --   --   --   --   --   --   --   --   --   --    < > =  values in this interval not displayed.   GFR: Estimated Creatinine Clearance:  43.2 mL/min (by C-G formula based on SCr of 0.96 mg/dL). Liver Function Tests: Recent Labs  Lab 08/03/24 0406 08/04/24 0500 08/06/24 1143  AST 28 34 36  ALT 13 17 21   ALKPHOS 57 64 54  BILITOT 0.4 0.4 0.4  PROT 5.1* 5.5* 5.3*  ALBUMIN  3.1* 3.4* 3.2*   No results for input(s): LIPASE, AMYLASE in the last 168 hours. No results for input(s): AMMONIA in the last 168 hours. Coagulation Profile: Recent Labs  Lab 08/06/24 1640  INR 1.1   Cardiac Enzymes: No results for input(s): CKTOTAL, CKMB, CKMBINDEX, TROPONINI in the last 168 hours. BNP (last 3 results) Recent Labs    07/24/24 1016 08/02/24 1551  PROBNP 4,537.0* 5,039.0*   HbA1C: No results for input(s): HGBA1C in the last 72 hours. CBG: Recent Labs  Lab 08/08/24 1619 08/08/24 2027 08/09/24 0014 08/09/24 0638 08/09/24 1100  GLUCAP 92 146* 106* 136* 112*   Lipid Profile: No results for input(s): CHOL, HDL, LDLCALC, TRIG, CHOLHDL, LDLDIRECT in the last 72 hours. Thyroid  Function Tests: No results for input(s): TSH, T4TOTAL, FREET4, T3FREE, THYROIDAB in the last 72 hours. Anemia Panel: No results for input(s): VITAMINB12, FOLATE, FERRITIN, TIBC, IRON , RETICCTPCT in the last 72 hours.  Sepsis Labs: Recent Labs  Lab 08/05/24 2103 08/06/24 0133 08/06/24 0343  LATICACIDVEN 5.7* 2.1* 1.1    Recent Results (from the past 240 hours)  Respiratory (~20 pathogens) panel by PCR     Status: None   Collection Time: 08/02/24 12:34 PM   Specimen: Nasopharyngeal Swab; Respiratory  Result Value Ref Range Status   Adenovirus NOT DETECTED NOT DETECTED Final   Coronavirus 229E NOT DETECTED NOT DETECTED Final    Comment: (NOTE) The Coronavirus on the Respiratory Panel, DOES NOT test for the novel  Coronavirus (2019 nCoV)    Coronavirus HKU1 NOT DETECTED NOT DETECTED Final   Coronavirus NL63 NOT DETECTED NOT DETECTED Final   Coronavirus OC43 NOT DETECTED NOT DETECTED Final    Metapneumovirus NOT DETECTED NOT DETECTED Final   Rhinovirus / Enterovirus NOT DETECTED NOT DETECTED Final   Influenza A NOT DETECTED NOT DETECTED Final   Influenza B NOT DETECTED NOT DETECTED Final   Parainfluenza Virus 1 NOT DETECTED NOT DETECTED Final   Parainfluenza Virus 2 NOT DETECTED NOT DETECTED Final   Parainfluenza Virus 3 NOT DETECTED NOT DETECTED Final   Parainfluenza Virus 4 NOT DETECTED NOT DETECTED Final   Respiratory Syncytial Virus NOT DETECTED NOT DETECTED Final   Bordetella pertussis NOT DETECTED NOT DETECTED Final   Bordetella Parapertussis NOT DETECTED NOT DETECTED Final   Chlamydophila pneumoniae NOT DETECTED NOT DETECTED Final   Mycoplasma pneumoniae NOT DETECTED NOT DETECTED Final    Comment: Performed at [facility], [address] [place], [place] [address]  Resp panel by RT-PCR (RSV, Flu A&B, Covid) Anterior Nasal Swab     Status: None   Collection Time: 08/02/24 12:34 PM   Specimen: Anterior Nasal Swab  Result Value Ref Range Status   SARS Coronavirus 2 by RT PCR NEGATIVE NEGATIVE Final   Influenza A by PCR NEGATIVE NEGATIVE Final   Influenza B by PCR NEGATIVE NEGATIVE Final    Comment: (NOTE) The Xpert Xpress SARS-CoV-2/FLU/RSV plus assay is intended as an aid in the diagnosis of influenza from Nasopharyngeal swab specimens and should not be used as a sole basis for treatment. Nasal washings and aspirates are unacceptable for Xpert Xpress  SARS-CoV-2/FLU/RSV testing.  Fact Sheet for Patients: bloggercourse.com  Fact Sheet for Healthcare Providers: seriousbroker.it  This test is not yet approved or cleared by the [place]  FDA and has been authorized for detection and/or diagnosis of SARS-CoV-2 by FDA under an Emergency Use Authorization (EUA). This EUA will remain in effect (meaning this test can be used) for the duration of the COVID-19 declaration under Section 564(b)(1) of the Act, 21  U.S.C. section 360bbb-3(b)(1), unless the authorization is terminated or revoked.     Resp Syncytial Virus by PCR NEGATIVE NEGATIVE Final    Comment: (NOTE) Fact Sheet for Patients: bloggercourse.com  Fact Sheet for Healthcare Providers: seriousbroker.it  This test is not yet approved or cleared by the [place]  FDA and has been authorized for detection and/or diagnosis of SARS-CoV-2 by FDA under an Emergency Use Authorization (EUA). This EUA will remain in effect (meaning this test can be used) for the duration of the COVID-19 declaration under Section 564(b)(1) of the Act, 21 U.S.C. section 360bbb-3(b)(1), unless the authorization is terminated or revoked.  Performed at [facility], [address], [place], [place] [address]   Expectorated Sputum Assessment w Gram Stain, Rflx to Resp Cult     Status: None   Collection Time: 08/04/24 11:48 AM   Specimen: Sputum  Result Value Ref Range Status   Specimen Description SPU  Final   Special Requests SPU  Final   Sputum evaluation   Final    THIS SPECIMEN IS ACCEPTABLE FOR SPUTUM CULTURE Performed at [facility], [address], [place], [place] [address]    Report Status 08/05/2024 FINAL  Final  Culture, Respiratory w Gram Stain     Status: None   Collection Time: 08/04/24 11:48 AM   Specimen: Sputum  Result Value Ref Range Status   Specimen Description SPU  Final   Special Requests SPU Reflexed from [id number]  Final   Gram Stain   Final    RARE WBC SEEN NO ORGANISMS SEEN Performed at [facility], [address] [place], [place] [address]    Culture FEW PSEUDOMONAS AERUGINOSA  Final   Report Status 08/08/2024 FINAL  Final   Organism ID, Bacteria PSEUDOMONAS AERUGINOSA  Final      Susceptibility   Pseudomonas aeruginosa - MIC*    MEROPENEM 2 SENSITIVE Sensitive     CIPROFLOXACIN <=0.06 SENSITIVE Sensitive     IMIPENEM >=16 RESISTANT Resistant      PIP/TAZO Value in next row Sensitive      <=4 SENSITIVEThis is a modified FDA-approved test that has been validated and its performance characteristics determined by the reporting laboratory.  This laboratory is certified under the Clinical Laboratory Improvement Amendments CLIA as qualified to perform high complexity clinical laboratory testing.    CEFEPIME  Value in next row Sensitive      <=4 SENSITIVEThis is a modified FDA-approved test that has been validated and its performance characteristics determined by the reporting laboratory.  This laboratory is certified under the Clinical Laboratory Improvement Amendments CLIA as qualified to perform high complexity clinical laboratory testing.    CEFTAZIDIME/AVIBACTAM Value in next row Sensitive      <=4 SENSITIVEThis is a modified FDA-approved test that has been validated and its performance characteristics determined by the reporting laboratory.  This laboratory is certified under the Clinical Laboratory Improvement Amendments CLIA as qualified to perform high complexity clinical laboratory testing.    CEFTOLOZANE/TAZOBACTAM Value in next row Sensitive      <=  4 SENSITIVEThis is a modified FDA-approved test that has been validated and its performance characteristics determined by the reporting laboratory.  This laboratory is certified under the Clinical Laboratory Improvement Amendments CLIA as qualified to perform high complexity clinical laboratory testing.    TOBRAMYCIN Value in next row Sensitive      <=4 SENSITIVEThis is a modified FDA-approved test that has been validated and its performance characteristics determined by the reporting laboratory.  This laboratory is certified under the Clinical Laboratory Improvement Amendments CLIA as qualified to perform high complexity clinical laboratory testing.    CEFTAZIDIME Value in next row Sensitive      <=4 SENSITIVEThis is a modified FDA-approved test that has been validated and its performance  characteristics determined by the reporting laboratory.  This laboratory is certified under the Clinical Laboratory Improvement Amendments CLIA as qualified to perform high complexity clinical laboratory testing.    * FEW PSEUDOMONAS AERUGINOSA  MRSA Next Gen by PCR, Nasal     Status: None   Collection Time: 08/04/24 12:30 PM   Specimen: Nasal Mucosa; Nasal Swab  Result Value Ref Range Status   MRSA by PCR Next Gen NOT DETECTED NOT DETECTED Final    Comment: (NOTE) The GeneXpert MRSA Assay (FDA approved for NASAL specimens only), is one component of a comprehensive MRSA colonization surveillance program. It is not intended to diagnose MRSA infection nor to guide or monitor treatment for MRSA infections. Test performance is not FDA approved in patients less than 13 years old. Performed at [facility], [address] [place], [place] [address]   MRSA Next Gen by PCR, Nasal     Status: None   Collection Time: 08/06/24 12:57 AM   Specimen: Nasal Mucosa; Nasal Swab  Result Value Ref Range Status   MRSA by PCR Next Gen NOT DETECTED NOT DETECTED Final    Comment: (NOTE) The GeneXpert MRSA Assay (FDA approved for NASAL specimens only), is one component of a comprehensive MRSA colonization surveillance program. It is not intended to diagnose MRSA infection nor to guide or monitor treatment for MRSA infections. Test performance is not FDA approved in patients less than 76 years old. Performed at [facility], [address], [place], [place] [address]   Respiratory (~20 pathogens) panel by PCR     Status: None   Collection Time: 08/06/24  8:17 AM   Specimen: Nasopharyngeal Swab; Respiratory  Result Value Ref Range Status   Adenovirus NOT DETECTED NOT DETECTED Final   Coronavirus 229E NOT DETECTED NOT DETECTED Final    Comment: (NOTE) The Coronavirus on the Respiratory Panel, DOES NOT test for the novel  Coronavirus (2019 nCoV)    Coronavirus HKU1 NOT DETECTED NOT DETECTED  Final   Coronavirus NL63 NOT DETECTED NOT DETECTED Final   Coronavirus OC43 NOT DETECTED NOT DETECTED Final   Metapneumovirus NOT DETECTED NOT DETECTED Final   Rhinovirus / Enterovirus NOT DETECTED NOT DETECTED Final   Influenza A NOT DETECTED NOT DETECTED Final   Influenza B NOT DETECTED NOT DETECTED Final   Parainfluenza Virus 1 NOT DETECTED NOT DETECTED Final   Parainfluenza Virus 2 NOT DETECTED NOT DETECTED Final   Parainfluenza Virus 3 NOT DETECTED NOT DETECTED Final   Parainfluenza Virus 4 NOT DETECTED NOT DETECTED Final   Respiratory Syncytial Virus NOT DETECTED NOT DETECTED Final   Bordetella pertussis NOT DETECTED NOT DETECTED Final   Bordetella Parapertussis NOT DETECTED NOT DETECTED Final   Chlamydophila pneumoniae NOT DETECTED NOT DETECTED Final  Mycoplasma pneumoniae NOT DETECTED NOT DETECTED Final    Comment: Performed at [facility], [address], [place], [place] [address]  Surgical pcr screen     Status: None   Collection Time: 08/07/24  1:58 AM   Specimen: Nasal Mucosa; Nasal Swab  Result Value Ref Range Status   MRSA, PCR NEGATIVE NEGATIVE Final   Staphylococcus aureus NEGATIVE NEGATIVE Final    Comment: (NOTE) The Xpert SA Assay (FDA approved for NASAL specimens in patients 29 years of age and older), is one component of a comprehensive surveillance program. It is not intended to diagnose infection nor to guide or monitor treatment. Performed at [facility], [address], [place], [place] [address]          Radiology Studies: CT Angio Chest Pulmonary Embolism (PE) W or WO Contrast Result Date: 08/08/2024 EXAM: CTA of the Chest with contrast for PE 08/08/2024 11:22:18 PM TECHNIQUE: CTA of the chest was performed without and with the administration of 75 mL of intravenous contrast (iohexol  (OMNIPAQUE ) 350 MG/ML injection 75 mL IOHEXOL  350 MG/ML SOLN). Multiplanar reformatted images are provided for review. MIP images are provided for review.  Automated exposure control, iterative reconstruction, and/or weight based adjustment of the mA/kV was utilized to reduce the radiation dose to as low as reasonably achievable. COMPARISON: Chest radiograph 08/07/2023 and CT chest 01/16/2024. CLINICAL HISTORY: Pulmonary embolism (PE) suspected, high prob; hemoptysis. Hemoptysis with increased oxygen requirement. FINDINGS: PULMONARY ARTERIES: Pulmonary arteries are adequately opacified for evaluation. No pulmonary embolism. Main pulmonary artery is normal in caliber. MEDIASTINUM: Treatment enhancement. No pericardial effusions. Calcification of the aorta and coronary arteries. No aortic aneurysm. Cardiac pacemaker. Esophagus is decompressed. The thyroid  gland is unremarkable. LYMPH NODES: No significant lymphadenopathy. LUNGS AND PLEURA: Emphysematous changes throughout the lungs. Airspace and interstitial infiltrates throughout the lungs. This could represent pneumonia, edema, or aspiration. Airways are patent. Large bilateral pleural effusions. No pneumothorax. UPPER ABDOMEN: Limited images of the upper abdomen are unremarkable. SOFT TISSUES AND BONES: Degenerative changes in the spine. No acute bone or soft tissue abnormality. IMPRESSION: 1. No pulmonary embolism. 2. Airspace and interstitial infiltrates throughout the lungs, possibly representing pneumonia, edema, or aspiration. 3. Large bilateral pleural effusions. 4. Emphysematous changes throughout the lungs, and pulmonary emphysema is an independent risk factor for lung cancer and recommend consideration for evaluation for a low-dose CT lung cancer screening program. Electronically signed by: Elsie Gravely MD 08/08/2024 11:29 PM EST RP Workstation: [id number]        Scheduled Meds:  amiodarone   200 mg Oral BID   arformoterol   15 mcg Nebulization BID   aspirin  EC  81 mg Oral Q0600   atorvastatin   80 mg Oral Daily   Chlorhexidine  Gluconate Cloth  6 each Topical Daily   feeding supplement  237 mL  Oral BID BM   furosemide   40 mg Intravenous BID   insulin  aspart  0-6 Units Subcutaneous TID WC   ipratropium-albuterol   3 mL Nebulization Q4H   levothyroxine   37.5 mcg Oral Q0600   mirtazapine   7.5 mg Oral QHS   mouth rinse  15 mL Mouth Rinse 4 times per day   ranolazine   1,000 mg Oral BID   revefenacin   175 mcg Nebulization Daily   sacubitril -valsartan   1 tablet Oral BID   sodium chloride  flush  10-40 mL Intracatheter Q12H   sodium chloride  flush  3 mL Intravenous Q12H   spironolactone   25 mg Oral Daily   valACYclovir   1,000  mg Oral Daily   Continuous Infusions:  piperacillin -tazobactam (ZOSYN )  IV 3.375 g (08/09/24 0504)     LOS: 16 days    Time spent: 45 minutes spent on chart review, discussion with nursing staff, consultants, updating family and interview/physical exam; more than 50% of that time was spent in counseling and/or coordination of care.    Harlene RAYMOND Bowl, DO [facility] Available via Epic secure chat 7am-7pm After these hours, please refer to coverage provider listed on [email or web address] 08/09/2024, 1:54 PM   "

## 2024-08-09 NOTE — Consult Note (Signed)
 "  NAME:  Pam [name], MRN:  [id number], DOB:  06-Dec-1950, LOS: 16 ADMISSION DATE:  07/24/2024, CONSULTATION DATE:  08/09/2024 REFERRING MD:  TRH, CHIEF COMPLAINT:  hypoxemia   History of Present Illness:  74 year old female w/ h/o HFrEF, has ICD, HTN, PAD, HLD, came in initially on 12/24 w/ right sided leg pain and ischemia. Went to OR for right axillobifemoral bypass and fight common femoral endartectomy on 12/29.    Course has been c/b post op hypotension; placed on midodrine , volume overload w/ no sig response to IV diuresis. Hyponatremia (acute on chronic) baseline mid 120s, got tolvaptan  on 1/2.  ADV HF consulted on 1/2. PICC placed (CVP ~11) wt still up, diuretics adjusted. On 1/4 still reporting increased SOB. CXR raising concern for PNA so abx started. Na down to 124. 1/5 seen again by adv HF. Wheezing, steroids and BDs added. Milrinone  started. Lasix  up to 80mg /d, Spiro added. Na 128->125   On 1/5 evening hrs had 4 episodes of VT triggering ICD shocks. Administered additional amiodarone  bolus. Milrinone  stopped. K 3.2 , Mg 1.6. has chronic LAD. W/ mild ST elevation. During events there was change in mental status with concern for possible seizure however this was felt primarily to be syncopal event associated with the VT.   On 1/5 she was transferred to the intensive care for progressive respiratory failure and hemodynamic instability including recurrent VT  Pertinent  Medical History    Significant Hospital Events: Including procedures, antibiotic start and stop dates in addition to other pertinent events     Interim History / Subjective:  Remains hypoxemic.  Requiring several liters.  Not worsening but no real improvement.  Objective    Blood pressure (!) 112/48, pulse 77, temperature 97.9 F (36.6 C), temperature source Oral, resp. rate 18, height 5' 3 (1.6 m), weight 62.1 kg, SpO2 93%. CVP:  [3 mmHg-9 mmHg] 6 mmHg  FiO2 (%):  [96 %] 96 %   Intake/Output Summary (Last 24  hours) at 08/09/2024 1639 Last data filed at 08/09/2024 1300 Gross per 24 hour  Intake 240 ml  Output 1175 ml  Net -935 ml   Filed Weights   08/07/24 0456 08/08/24 0347 08/09/24 0351  Weight: 55 kg 60 kg 62.1 kg    Examination: General: Sitting up, receiving nebulizer treatment HENT: Atraumatic normocephalic Lungs: Clear anteriorly no wheeze Cardiovascular: Borderline tachycardic, no murmur, left upper extremity edema, minimal lower extremity edema in right upper extremity edema noted Abdomen: Nondistended  Resolved problem list   Assessment and Plan   Acute hypoxemic respiratory failure: Multifactorial but hospitalized for CHF exacerbation with most recent cross-sectional imaging 1/8 with bilateral pleural effusions and interlobular septal thickening ground glass opacities.  Most consistent with pulmonary edema and pulmonary venous hypertension, volume overload.  Inflammatory insult such as pneumonia or other is considered but felt less likely given the pattern.  Right heart catheterization reviewed with elevated wedge pressure.  Suspect shunt physiology related to atelectasis as well. -- Recommend ongoing IV diuresis, appreciate advanced heart failure team assistance -- Continue antibiotics -- Out of bed as much as possible, ambulate as able, incentive spirometry and flutter valve frequently, needs to mobilize with good pulmonary hygiene -- Oxygen saturation goal 88%, please wean oxygen as able  Emphysema: -- Continue nebulized therapies  Hemoptysis: No significant cough or hemoptysis during my evaluation in the patient room.  Suspect related to pulmonary venous hypertension and possible infectious source. -- If worsens consider TXA nebs  Case discussed with  attending hospitalist and heart failure team.  Labs   CBC: Recent Labs  Lab 08/06/24 0501 08/06/24 0622 08/06/24 1143 08/06/24 1640 08/07/24 0158 08/07/24 0905 08/07/24 0906 08/07/24 0915 08/07/24 1245  08/08/24 0543 08/09/24 0520  WBC 9.1   < > 10.8* 11.1* 9.9  --   --   --  12.7* 9.7 10.9*  NEUTROABS 7.8*  --  9.3*  --   --   --   --   --   --   --   --   HGB 7.8*   < > 7.5* 7.4* 6.9*   < > 8.2* 7.8* 8.6* 8.2* 8.5*  HCT 22.2*   < > 21.7* 22.1* 20.0*   < > 24.0* 23.0* 25.7* 23.9* 25.1*  MCV 89.9   < > 91.2 92.9 92.2  --   --   --  93.1 88.8 90.6  PLT 374   < > 389 414* 337  --   --   --  351 333 340   < > = values in this interval not displayed.    Basic Metabolic Panel: Recent Labs  Lab 08/06/24 0200 08/06/24 0500 08/06/24 1143 08/06/24 1640 08/06/24 2331 08/07/24 0158 08/07/24 0905 08/07/24 1245 08/07/24 1935 08/08/24 0015 08/08/24 0543 08/09/24 0520  NA 122*  --    < > 126*   < > 131*   < > 132* 129* 130* 130* 130*  K 3.4*  --    < > 3.6   < > 3.7   < > 3.1* 3.9 3.8 4.1 4.7  CL 84*  --    < > 87*   < > 90*  --  90* 88* 89* 90* 92*  CO2 26  --    < > 27   < > 29  --  30 29 30 31 29   GLUCOSE 191*  --    < > 213*   < > 160*  --  186* 90 137* 102* 109*  BUN 23  --    < > 19   < > 21  --  26* 30* 33* 32* 30*  CREATININE 0.94  --    < > 0.95   < > 1.00  --  1.05* 1.10* 1.12* 1.02* 0.96  CALCIUM  8.9  --    < > 8.0*   < > 7.6*  --  7.8* 8.6* 8.7* 9.3 9.4  MG 2.3 2.2  --  2.3  --  2.2  --   --   --   --  2.1 2.0  PHOS 3.0  --   --   --   --   --   --   --   --   --   --   --    < > = values in this interval not displayed.   GFR: Estimated Creatinine Clearance: 43.2 mL/min (by C-G formula based on SCr of 0.96 mg/dL). Recent Labs  Lab 08/05/24 2103 08/06/24 0133 08/06/24 0343 08/06/24 0501 08/07/24 0158 08/07/24 1245 08/08/24 0543 08/09/24 0520  WBC 10.3  --   --    < > 9.9 12.7* 9.7 10.9*  LATICACIDVEN 5.7* 2.1* 1.1  --   --   --   --   --    < > = values in this interval not displayed.    Liver Function Tests: Recent Labs  Lab 08/03/24 0406 08/04/24 0500 08/06/24 1143  AST 28 34 36  ALT 13 17 21   ALKPHOS 57 64  54  BILITOT 0.4 0.4 0.4  PROT 5.1* 5.5* 5.3*   ALBUMIN  3.1* 3.4* 3.2*   No results for input(s): LIPASE, AMYLASE in the last 168 hours. No results for input(s): AMMONIA in the last 168 hours.  ABG    Component Value Date/Time   PHART 7.430 08/05/2024 2242   PCO2ART 35.1 08/05/2024 2242   PO2ART 132 (H) 08/05/2024 2242   HCO3 30.1 (H) 08/07/2024 0915   TCO2 31 08/07/2024 0915   ACIDBASEDEF 1.0 08/05/2024 2242   O2SAT 46.1 08/08/2024 0525     Coagulation Profile: Recent Labs  Lab 08/06/24 1640  INR 1.1    Cardiac Enzymes: No results for input(s): CKTOTAL, CKMB, CKMBINDEX, TROPONINI in the last 168 hours.  HbA1C: Hgb A1c MFr Bld  Date/Time Value Ref Range Status  05/30/2024 08:51 AM 5.4 <5.7 % Final    Comment:    For the purpose of screening for the presence of diabetes: . <5.7%       Consistent with the absence of diabetes 5.7-6.4%    Consistent with increased risk for diabetes             (prediabetes) > or =6.5%  Consistent with diabetes . This assay result is consistent with a decreased risk of diabetes. . Currently, no consensus exists regarding use of hemoglobin A1c for diagnosis of diabetes in children. . According to American Diabetes Association (ADA) guidelines, hemoglobin A1c <7.0% represents optimal control in non-pregnant diabetic patients. Different metrics may apply to specific patient populations.  Standards of Medical Care in Diabetes(ADA). .   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   Surgical History:   Past Surgical History:  Procedure Laterality Date   AXILLARY-FEMORAL BYPASS GRAFT Bilateral 07/29/2024   Procedure: CREATION, BYPASS, ARTERIAL, AXILLARY TO BILATERAL FEMORAL, USING GRAFT;  Surgeon: Lanis Fonda BRAVO, MD;  Location: [facility] OR;  Service: Vascular;  Laterality: Bilateral;   CARDIAC CATHETERIZATION N/A 07/08/2016   Procedure: Left Heart Cath and Coronary Angiography;  Surgeon: Lonni Hanson, MD;  Location: [facility] INVASIVE CV LAB;  Service: Cardiovascular;  Laterality: N/A;   CARDIAC CATHETERIZATION N/A 08/29/2016   Procedure: Coronary Atherectomy;  Surgeon: Lonni Hanson, MD;  Location: [facility] INVASIVE CV LAB;  Service: Cardiovascular;  Laterality: N/A;   CARDIAC CATHETERIZATION N/A 08/29/2016   Procedure: Coronary Stent Intervention;  Surgeon: Lonni Hanson, MD;  Location: [facility] INVASIVE CV LAB;  Service: Cardiovascular;  Laterality: N/A;   CATARACT EXTRACTION, BILATERAL     COLONOSCOPY WITH PROPOFOL  N/A 11/18/2021   Procedure: COLONOSCOPY WITH PROPOFOL ;  Surgeon: Eda Iha, MD;  Location: [facility];  Service: Gastroenterology;  Laterality: N/A;   CORONARY ANGIOPLASTY     coronary stents      ENDARTERECTOMY FEMORAL Bilateral 07/29/2024   Procedure: ENDARTERECTOMY, FEMORAL;  Surgeon: Lanis Fonda BRAVO, MD;  Location: [facility] OR;  Service: Vascular;  Laterality: Bilateral;   HAND SURGERY Right    Dr Alyse Gaba   ICD IMPLANT N/A 08/28/2019   Procedure: ICD IMPLANT;  Surgeon: Inocencio Soyla Lunger, MD;  Location: [facility]  INVASIVE CV LAB;  Service: Cardiovascular;  Laterality: N/A;   LEFT HEART CATH AND CORONARY ANGIOGRAPHY N/A 04/29/2022   Procedure: LEFT HEART CATH AND CORONARY ANGIOGRAPHY;  Surgeon: Anner Alm ORN, MD;  Location: [facility] INVASIVE CV LAB;  Service: Cardiovascular;  Laterality: N/A;   LOWER EXTREMITY ANGIOGRAPHY N/A 06/19/2024   Procedure: Lower Extremity Angiography;  Surgeon: Darron Deatrice LABOR, MD;  Location: [facility] INVASIVE CV LAB;  Service: Cardiovascular;  Laterality: N/A;   PARATHYROIDECTOMY Right 02/02/2021   Procedure: RIGHT SUPERIOR PARATHYROIDECTOMY;  Surgeon: Eletha Boas, MD;  Location: [facility] ORS;  Service: General;  Laterality: Right;   POLYPECTOMY  11/18/2021   Procedure: POLYPECTOMY;  Surgeon: Eda Iha, MD;  Location: [facility]COPY;  Service: Gastroenterology;;   RIGHT HEART CATH N/A 08/11/2022   Procedure: RIGHT HEART CATH;  Surgeon: Gardenia Led, DO;  Location: [facility] INVASIVE CV LAB;  Service: Cardiovascular;  Laterality: N/A;   RIGHT HEART CATH N/A 08/07/2024   Procedure: RIGHT HEART CATH;  Surgeon: Rolan Ezra RAMAN, MD;  Location: [facility] INVASIVE CV LAB;  Service: Cardiovascular;  Laterality: N/A;   VAGINAL HYSTERECTOMY       Social History:   reports that she quit smoking about 8 years ago. Her smoking use included cigarettes. She started smoking about 58 years ago. She has a 50 pack-year smoking history. She has never used smokeless tobacco. She reports that she does not currently use alcohol . She reports that she does not use drugs.   Family History:  Her family history includes Heart disease in her mother and sister; Kidney failure in her mother. There is no history of Colon cancer, Esophageal cancer, Rectal cancer, or Stomach cancer.   Allergies Allergies[1]   Home Medications  Prior to Admission medications  Medication Sig Start Date End Date Taking? Authorizing Provider  acetaminophen  (TYLENOL ) 500 MG tablet Take 1,000 mg by mouth every 6 (six) hours as needed for  moderate pain.   Yes [provider]  albuterol  (VENTOLIN  HFA) 108 (90 Base) MCG/ACT inhaler Inhale 2 puffs into the lungs every 6 (six) hours as needed for wheezing or shortness of breath. 12/28/23  Yes Hope Almarie ORN, NP  aspirin  81 MG chewable tablet Chew 81 mg by mouth daily.   Yes [provider]  atorvastatin  (LIPITOR ) 80 MG tablet TAKE 1 TABLET(80 MG) BY MOUTH DAILY 11/27/18  Yes Verlin Lonni BIRCH, MD  bisoprolol  (ZEBETA ) 5 MG tablet Take 0.5 tablets (2.5 mg total) by mouth daily. 06/26/24  Yes Milford, Harlene HERO, FNP  CALCIUM  PO Take 1 tablet by mouth daily.   Yes [provider]  Cholecalciferol (VITAMIN D3) 50 MCG (2000 UT) TABS Take 2,000 Units by mouth daily.   Yes [provider]  clopidogrel  (PLAVIX ) 75 MG tablet TAKE 1 TABLET(75 MG) BY MOUTH DAILY WITH BREAKFAST 11/27/23  Yes Sabharwal, Aditya, DO  digoxin  (LANOXIN ) 0.125 MG tablet Take 0.5 tablets (0.0625 mg total) by mouth daily. 11/22/23  Yes [place], Harlene HERO, FNP  furosemide  (LASIX ) 20 MG tablet TAKE 1 TABLET BY MOUTH AS NEEDED  02/27/24  Yes Sabharwal, Aditya, DO  levothyroxine  (SYNTHROID ) 25 MCG tablet Take 37.5 mcg by mouth daily before breakfast. 07/10/23  Yes Thapa, [place], MD  nitroGLYCERIN  (NITROSTAT ) 0.4 MG SL tablet Place 1 tablet (0.4 mg total) under the tongue every 5 (five) minutes x 3 doses as needed for chest pain. 06/14/24  Yes Verlin Lonni BIRCH, MD  nystatin  cream (MYCOSTATIN ) Apply 1 Application topically 2 (two) times daily as needed (rash). 05/09/22  Yes [provider]  ranolazine  (RANEXA ) 1000 MG SR tablet TAKE 1 TABLET(1000 MG) BY MOUTH TWICE DAILY 02/09/24  Yes Mil[place], [place], FNP  sacubitril -valsartan  (ENTRESTO ) 49-51 MG Take 1 tablet by mouth 2 (two) times daily. 05/01/24  Yes Clegg, Amy D, NP  spironolactone  (ALDACTONE ) 25 MG tablet TAKE 1 TABLET(25 MG) BY MOUTH DAILY 07/03/23  Yes Sabharwal, Aditya, DO  umeclidinium-vilanterol (ANORO ELLIPTA ) 62.5-25  MCG/ACT AEPB Inhale 1 puff into the lungs daily. 12/28/23  Yes Hope Almarie ORN, NP  valACYclovir  (VALTREX ) 1000 MG tablet Take 1,000 mg by mouth daily.   Yes [provider]     Critical care time: n/a              [1]  Allergies Allergen Reactions   Lisinopril  Cough    Dry cough   "

## 2024-08-09 NOTE — Progress Notes (Signed)
 Left upper extremity venous duplex has been completed.  Results can be found in chart review under CV Proc.  08/09/2024 7:18 PM  Bana Borgmeyer Elden Appl, RVT.

## 2024-08-09 NOTE — Progress Notes (Signed)
 2200--Pt coughing up blood/bloody sputum. Hospitalist made aware; CTA chest ordered. En route to CT, RN noted red flecks in urine but no overt hematuria.

## 2024-08-09 NOTE — Plan of Care (Signed)
" °  Problem: Education: Goal: Knowledge of General Education information will improve Description: Including pain rating scale, medication(s)/side effects and non-pharmacologic comfort measures Outcome: Progressing   Problem: Health Behavior/Discharge Planning: Goal: Ability to manage health-related needs will improve Outcome: Progressing   Problem: Clinical Measurements: Goal: Will remain free from infection Outcome: Progressing Goal: Diagnostic test results will improve Outcome: Progressing Goal: Respiratory complications will improve Outcome: Progressing Goal: Cardiovascular complication will be avoided Outcome: Progressing   Problem: Activity: Goal: Risk for activity intolerance will decrease Outcome: Progressing   Problem: Nutrition: Goal: Adequate nutrition will be maintained Outcome: Progressing   Problem: Coping: Goal: Level of anxiety will decrease Outcome: Progressing   Problem: Elimination: Goal: Will not experience complications related to bowel motility Outcome: Progressing Goal: Will not experience complications related to urinary retention Outcome: Progressing   Problem: Safety: Goal: Ability to remain free from injury will improve Outcome: Progressing   Problem: Skin Integrity: Goal: Risk for impaired skin integrity will decrease Outcome: Progressing   Problem: Education: Goal: Knowledge of the prescribed therapeutic regimen will improve Outcome: Progressing   Problem: Bowel/Gastric: Goal: Gastrointestinal status for postoperative course will improve Outcome: Progressing   Problem: Cardiac: Goal: Ability to maintain an adequate cardiac output Outcome: Progressing Goal: Will show no evidence of cardiac arrhythmias Outcome: Progressing   Problem: Nutritional: Goal: Will attain and maintain optimal nutritional status Outcome: Progressing   Problem: Neurological: Goal: Will regain or maintain usual level of consciousness Outcome:  Progressing   Problem: Clinical Measurements: Goal: Ability to maintain clinical measurements within normal limits Outcome: Progressing Goal: Postoperative complications will be avoided or minimized Outcome: Progressing   Problem: Respiratory: Goal: Will regain and/or maintain adequate ventilation Outcome: Progressing Goal: Respiratory status will improve Outcome: Progressing   Problem: Skin Integrity: Goal: Demonstrates signs of wound healing without infection Outcome: Progressing   Problem: Urinary Elimination: Goal: Will remain free from infection Outcome: Progressing Goal: Ability to achieve and maintain adequate urine output Outcome: Progressing   Problem: Education: Goal: Ability to demonstrate management of disease process will improve Outcome: Progressing Goal: Ability to verbalize understanding of medication therapies will improve Outcome: Progressing Goal: Individualized Educational Video(s) Outcome: Progressing   Problem: Activity: Goal: Capacity to carry out activities will improve Outcome: Progressing   Problem: Cardiac: Goal: Ability to achieve and maintain adequate cardiopulmonary perfusion will improve Outcome: Progressing   Problem: Education: Goal: Ability to describe self-care measures that may prevent or decrease complications (Diabetes Survival Skills Education) will improve Outcome: Progressing Goal: Individualized Educational Video(s) Outcome: Progressing   Problem: Coping: Goal: Ability to adjust to condition or change in health will improve Outcome: Progressing   Problem: Fluid Volume: Goal: Ability to maintain a balanced intake and output will improve Outcome: Progressing   Problem: Health Behavior/Discharge Planning: Goal: Ability to identify and utilize available resources and services will improve Outcome: Progressing Goal: Ability to manage health-related needs will improve Outcome: Progressing   Problem: Metabolic: Goal:  Ability to maintain appropriate glucose levels will improve Outcome: Progressing   Problem: Nutritional: Goal: Maintenance of adequate nutrition will improve Outcome: Progressing Goal: Progress toward achieving an optimal weight will improve Outcome: Progressing   Problem: Skin Integrity: Goal: Risk for impaired skin integrity will decrease Outcome: Progressing   Problem: Tissue Perfusion: Goal: Adequacy of tissue perfusion will improve Outcome: Progressing   Problem: Education: Goal: Understanding of CV disease, CV risk reduction, and recovery process will improve Outcome: Progressing Goal: Individualized Educational Video(s) Outcome: Progressing   "

## 2024-08-09 NOTE — Progress Notes (Signed)
 Physical Therapy Treatment Patient Details Name: Pam [name] MRN: [id number] DOB: Aug 25, 1950 Today's Date: 08/09/2024   History of Present Illness Pt is a 74 y.o female admitted 12/24 for SOB and bil LE edema. Chest x-ray showed pulmonary edema. Pt is scheduled for bypass surgery on 12/29. PMH: CAD, CHF, PVD, ICD, HLD, PAD, NSTEMI, cataracts    PT Comments  Pt resting in bed on arrival, agreeable to session and demonstrating continued progress towards acute goals. Pt performing bed mobility with min A to manage trunk and scoot out to EOB. Pt demonstrating transfers and gait with RW for support with grossly CGA for safety with pt progressing gait distance with x1 standing rest break due to fatigue. Pt maintaining SpO2 >90% on 8L throughout session. Pt up in chair at end of session with all needs met. Encouraged pt to be up and mobilize frequently with staff over weekend and between therapies to maximize functional mobility gains with pt verbalizing understanding. Pt continues to benefit from skilled PT services to progress toward functional mobility goals.      If plan is discharge home, recommend the following: A little help with walking and/or transfers;A little help with bathing/dressing/bathroom;Assist for transportation;Help with stairs or ramp for entrance   Can travel by private vehicle        Equipment Recommendations  Rolling walker (2 wheels)    Recommendations for Other Services       Precautions / Restrictions Precautions Precautions: Fall;Other (comment) (prevena wound vac) Recall of Precautions/Restrictions: Intact Precaution/Restrictions Comments: watch o2 Restrictions Weight Bearing Restrictions Per Provider Order: No     Mobility  Bed Mobility Overal bed mobility: Needs Assistance Bed Mobility: Supine to Sit     Supine to sit: Min assist     General bed mobility comments: light min A to elevate trunk and scoot out to EOB    Transfers Overall transfer level:  Needs assistance Equipment used: Rolling walker (2 wheels) Transfers: Sit to/from Stand Sit to Stand: Contact guard assist           General transfer comment: pt standing from EOB at lowest heigh and low commode, performing x5 at end fo session from recliner    Ambulation/Gait Ambulation/Gait assistance: Contact guard assist Gait Distance (Feet): 15 Feet (+ 65') Assistive device: Rolling walker (2 wheels) Gait Pattern/deviations: Step-through pattern, Decreased stride length, Trunk flexed, Decreased step length - right, Shuffle Gait velocity: reduced     General Gait Details: steady gait with RW for support, x1 standing rest due to fatigue. pt reporting she felt shaky but not visibly tremulous, SpO2 >90% on 8L   Stairs             Wheelchair Mobility     Tilt Bed    Modified Rankin (Stroke Patients Only)       Balance Overall balance assessment: Needs assistance Sitting-balance support: No upper extremity supported, Feet supported Sitting balance-Leahy Scale: Good Sitting balance - Comments: seated EOB   Standing balance support: Single extremity supported, Bilateral upper extremity supported Standing balance-Leahy Scale: Poor Standing balance comment: reliant of RW                            Communication Communication Communication: Impaired Factors Affecting Communication: Hearing impaired  Cognition Arousal: Alert Behavior During Therapy: WFL for tasks assessed/performed   PT - Cognitive impairments: No apparent impairments  Following commands: Intact      Cueing Cueing Techniques: Verbal cues  Exercises Other Exercises Other Exercises: x5 serial sit<>stands    General Comments General comments (skin integrity, edema, etc.): spouse present and supportive      Pertinent Vitals/Pain Pain Assessment Pain Assessment: Faces Faces Pain Scale: No hurt Pain Intervention(s): Monitored during session     Home Living                          Prior Function            PT Goals (current goals can now be found in the care plan section) Acute Rehab PT Goals Patient Stated Goal: to feel better PT Goal Formulation: With patient/family Time For Goal Achievement: 08/09/24 Progress towards PT goals: Progressing toward goals    Frequency    Min 2X/week      PT Plan      Co-evaluation              AM-PAC PT 6 Clicks Mobility   Outcome Measure  Help needed turning from your back to your side while in a flat bed without using bedrails?: A Little Help needed moving from lying on your back to sitting on the side of a flat bed without using bedrails?: A Little Help needed moving to and from a bed to a chair (including a wheelchair)?: A Little Help needed standing up from a chair using your arms (e.g., wheelchair or bedside chair)?: A Little Help needed to walk in hospital room?: A Little Help needed climbing 3-5 steps with a railing? : Total 6 Click Score: 16    End of Session   Activity Tolerance: Patient tolerated treatment well Patient left: with call bell/phone within reach;with family/visitor present;in chair Nurse Communication: Mobility status (RN present during session) PT Visit Diagnosis: Other abnormalities of gait and mobility (R26.89);Muscle weakness (generalized) (M62.81);Pain;Difficulty in walking, not elsewhere classified (R26.2) Pain - Right/Left: Left Pain - part of body: Ankle and joints of foot     Time: 1431-1458 PT Time Calculation (min) (ACUTE ONLY): 27 min  Charges:    $Gait Training: 8-22 mins $Therapeutic Activity: 8-22 mins PT General Charges $$ ACUTE PT VISIT: 1 Visit                     Kyion Gautier R. PTA Acute Rehabilitation Services Office: [phone]   Therisa CHRISTELLA Boor 08/09/2024, 4:31 PM

## 2024-08-09 NOTE — Progress Notes (Addendum)
" °  Progress Note    08/09/2024 8:01 AM 2 Days Post-Op  Subjective:  says she is feeling okay this morning. Explains that she worked a lot with therapy yesterday and spent most of day in chair. She says she is unsure why she was coughing up blood   Vitals:   08/09/24 0351 08/09/24 0747  BP: (!) 108/46 (!) 109/57  Pulse: 77   Resp: 18   Temp: (!) 97.5 F (36.4 C)   SpO2: 93%    Physical Exam: Cardiac:  regular Lungs:  non labored, on HFNC Incisions:  right infraclavicular incision is clean, dry and intact. Left groin incision healing well.right groin with Prevena VAC Extremities:  BLE with doppler DP and PT signals, TED hose in place Abdomen:  soft Neurologic: alert and oriented  CBC    Component Value Date/Time   WBC 10.9 (H) 08/09/2024 0520   RBC 2.77 (L) 08/09/2024 0520   HGB 8.5 (L) 08/09/2024 0520   HGB 13.6 06/11/2024 1039   HCT 25.1 (L) 08/09/2024 0520   HCT 39.0 06/11/2024 1039   PLT 340 08/09/2024 0520   PLT 335 06/11/2024 1039   MCV 90.6 08/09/2024 0520   MCV 102 (H) 06/11/2024 1039   MCH 30.7 08/09/2024 0520   MCHC 33.9 08/09/2024 0520   RDW 16.1 (H) 08/09/2024 0520   RDW 13.1 06/11/2024 1039   LYMPHSABS 0.5 (L) 08/06/2024 1143   LYMPHSABS 2.9 08/25/2016 1648   MONOABS 1.0 08/06/2024 1143   EOSABS 0.0 08/06/2024 1143   EOSABS 0.2 08/25/2016 1648   BASOSABS 0.0 08/06/2024 1143   BASOSABS 0.0 08/25/2016 1648    BMET    Component Value Date/Time   NA 130 (L) 08/09/2024 0520   NA 132 (L) 06/11/2024 1039   K 4.7 08/09/2024 0520   CL 92 (L) 08/09/2024 0520   CO2 29 08/09/2024 0520   GLUCOSE 109 (H) 08/09/2024 0520   BUN 30 (H) 08/09/2024 0520   BUN 15 06/11/2024 1039   CREATININE 0.96 08/09/2024 0520   CREATININE 1.13 (H) 05/30/2024 0851   CALCIUM  9.4 08/09/2024 0520   GFRNONAA >60 08/09/2024 0520   GFRAA 78 08/19/2019 0830    INR    Component Value Date/Time   INR 1.1 08/06/2024 1640     Intake/Output Summary (Last 24 hours) at 08/09/2024  0801 Last data filed at 08/09/2024 0519 Gross per 24 hour  Intake 240 ml  Output 1375 ml  Net -1135 ml     Assessment/Plan:  74 y.o. female is s/p R axillary bifemoral bypass 07/29/2024 by Dr. Lanis for BLE CLI with rest pain.  Admission has been complicated by fluid overload and acute respiratory failure with acute on chronic CHF  Appreciate TRH and heart failure team assistance in her care Continue Aspirin  and statin. Add plavix  if hgb remains stable and hemoptysis resolved CT did not show anything acute Vascular will follow intermittently   Teretha Damme, PA-C [facility] [phone] 08/09/2024 8:01 AM  VASCULAR STAFF ADDENDUM: I have independently interviewed and examined the patient. I agree with the above.  Will continue to hold Plavix  due to bloody sputum.  May be added early next week Appreciate continued aggressive diuresis, pulmonary toilet Will continue to follow with plans to see Monday.  Fonda FORBES Lanis MD [facility] Phone Number: [phone] 08/09/2024 10:10 AM   "

## 2024-08-10 DIAGNOSIS — I5020 Unspecified systolic (congestive) heart failure: Secondary | ICD-10-CM | POA: Diagnosis not present

## 2024-08-10 LAB — CBC
HCT: 24.1 % — ABNORMAL LOW (ref 36.0–46.0)
Hemoglobin: 8.2 g/dL — ABNORMAL LOW (ref 12.0–15.0)
MCH: 30.8 pg (ref 26.0–34.0)
MCHC: 34 g/dL (ref 30.0–36.0)
MCV: 90.6 fL (ref 80.0–100.0)
Platelets: 335 K/uL (ref 150–400)
RBC: 2.66 MIL/uL — ABNORMAL LOW (ref 3.87–5.11)
RDW: 16.1 % — ABNORMAL HIGH (ref 11.5–15.5)
WBC: 14.4 K/uL — ABNORMAL HIGH (ref 4.0–10.5)
nRBC: 0.4 % — ABNORMAL HIGH (ref 0.0–0.2)

## 2024-08-10 LAB — BASIC METABOLIC PANEL WITH GFR
Anion gap: 7 (ref 5–15)
BUN: 34 mg/dL — ABNORMAL HIGH (ref 8–23)
CO2: 32 mmol/L (ref 22–32)
Calcium: 9.5 mg/dL (ref 8.9–10.3)
Chloride: 90 mmol/L — ABNORMAL LOW (ref 98–111)
Creatinine, Ser: 1.16 mg/dL — ABNORMAL HIGH (ref 0.44–1.00)
GFR, Estimated: 50 mL/min — ABNORMAL LOW
Glucose, Bld: 102 mg/dL — ABNORMAL HIGH (ref 70–99)
Potassium: 4.3 mmol/L (ref 3.5–5.1)
Sodium: 129 mmol/L — ABNORMAL LOW (ref 135–145)

## 2024-08-10 LAB — MAGNESIUM: Magnesium: 1.9 mg/dL (ref 1.7–2.4)

## 2024-08-10 LAB — HEPARIN LEVEL (UNFRACTIONATED): Heparin Unfractionated: <0.1 [IU]/mL — ABNORMAL LOW (ref 0.30–0.70)

## 2024-08-10 LAB — GLUCOSE, CAPILLARY
Glucose-Capillary: 117 mg/dL — ABNORMAL HIGH (ref 70–99)
Glucose-Capillary: 118 mg/dL — ABNORMAL HIGH (ref 70–99)
Glucose-Capillary: 110 mg/dL — ABNORMAL HIGH (ref 70–99)
Glucose-Capillary: 141 mg/dL — ABNORMAL HIGH (ref 70–99)

## 2024-08-10 MED ORDER — HEPARIN BOLUS VIA INFUSION
3000.0000 [IU] | Freq: Once | INTRAVENOUS | Status: AC
  Administered 2024-08-10: 3000 [IU] via INTRAVENOUS
  Filled 2024-08-10: qty 3000

## 2024-08-10 MED ORDER — HEPARIN (PORCINE) 25000 UT/250ML-% IV SOLN
900.0000 [IU]/h | INTRAVENOUS | Status: DC
  Administered 2024-08-10: 900 [IU]/h via INTRAVENOUS
  Administered 2024-08-11: 1000 [IU]/h via INTRAVENOUS
  Administered 2024-08-12 – 2024-08-13 (×2): 1100 [IU]/h via INTRAVENOUS
  Filled 2024-08-10 (×4): qty 250

## 2024-08-10 NOTE — Progress Notes (Signed)
 PHARMACY - ANTICOAGULATION CONSULT NOTE  Pharmacy Consult for heparin  Indication: DVT in left axillary vein   Allergies[1]  Patient Measurements: Height: 5' 3 (160 cm) Weight: 62.8 kg (138 lb 7.2 oz) IBW/kg (Calculated) : 52.4 HEPARIN  DW (KG): 54.3  Vital Signs: Temp: 98.3 F (36.8 C) (01/10 1338) Temp Source: Oral (01/10 1338) BP: 100/38 (01/10 1338) Pulse Rate: 71 (01/10 1338)  Labs: Recent Labs    08/08/24 0543 08/09/24 0520 08/10/24 0615  HGB 8.2* 8.5* 8.2*  HCT 23.9* 25.1* 24.1*  PLT 333 340 335  CREATININE 1.02* 0.96 1.16*    Estimated Creatinine Clearance: 35.7 mL/min (A) (by C-G formula based on SCr of 1.16 mg/dL (H)).   Medical History: Past Medical History:  Diagnosis Date   Abnormal EKG    AICD (automatic cardioverter/defibrillator) present    [employer]   Alcoholic intoxication without complication    Allergy    Cardiomyopathy, ischemic    Cataract    Chest pain 08/29/2016   Chest pain 08/29/2016   CHF (congestive heart failure) (HCC)    Chronic systolic heart failure (HCC) 08/29/2016   Coronary artery disease    Facial laceration    Fall 07/07/2016   Hyperlipidemia    Hypertension 03/03/2017   Hyponatremia    Hypothyroidism 07/08/2016   NSTEMI (non-ST elevated myocardial infarction) [facility])    Syncope    Thyroid  disease    Assessment: 74 yo female s/p R axillary bifemoral bypass 07/29/2024 with admission complicated by fluid overload, hypotension, and respiratory insufficiency. UE dulplex (1/9) positive for DVT in the left axillary vein and positive for an SVT in the cephalic vein. Anticoagulation was initially on hold due to hemoptysis, which has improved and now resolved.   Given the recent concerns for hemoptysis, will give a lower bolus dose of 3000 units x1 and initiate heparin  infusion rate of 900 units/hr. Would also target a goal heparin  level of 0.3-0.5 units/mL due to recent hemoptysis and could consider expanding goal range if  hemoptysis remains stable. Will check heparin  level at baseline, in 8 hours from starting infusion, and daily with morning labs.   Goal of Therapy:  Heparin  level 0.3-0.5 units/ml (due to recent hemoptysis)  Monitor platelets by anticoagulation protocol: Yes   Plan:  Give 3000 units bolus x 1 Start heparin  infusion at 900 units/hr Check anti-Xa level in 8 hours and daily while on heparin  Continue to monitor H&H and platelets, and signs/symptoms of bleed  Feliciano Close, PharmD PGY2 Infectious Diseases Pharmacy Resident     [1]  Allergies Allergen Reactions   Lisinopril  Cough    Dry cough

## 2024-08-10 NOTE — Progress Notes (Signed)
 Mobility Specialist: Progress Note   08/10/24 1000  Mobility  Activity Ambulated with assistance  Level of Assistance Standby assist, set-up cues, supervision of patient - no hands on  Assistive Device Front wheel walker  Distance Ambulated (ft) 150 ft  Activity Response Tolerated well  Mobility Referral Yes  Mobility visit 1 Mobility  Mobility Specialist Start Time (ACUTE ONLY) [id number]  Mobility Specialist Stop Time (ACUTE ONLY) 0958  Mobility Specialist Time Calculation (min) (ACUTE ONLY) 33 min    Pt received in bed, agreeable to mobility session. Husband present. SV throughout. C/o RLE pain and fatigue throughout. SpO2 >90% on 8LO2. Ambulated to the nursing station and back to room without fault. Wanted to sit up in the chair for awhile. Left in chair with all needs met, call bell in reach.   Ileana Lute Mobility Specialist Please contact via SecureChat or Rehab office at [phone]

## 2024-08-10 NOTE — Plan of Care (Signed)
" °  Problem: Clinical Measurements: Goal: Respiratory complications will improve Outcome: Progressing   Problem: Activity: Goal: Risk for activity intolerance will decrease Outcome: Progressing   Problem: Nutrition: Goal: Adequate nutrition will be maintained Outcome: Progressing   Problem: Clinical Measurements: Goal: Cardiovascular complication will be avoided Outcome: Progressing   Problem: Safety: Goal: Ability to remain free from injury will improve Outcome: Progressing   "

## 2024-08-10 NOTE — Progress Notes (Signed)
 " PROGRESS NOTE    Pam [name]  FMW:[id number] DOB: 31-Jan-1951 DOA: 07/24/2024 PCP: Andrew Truman GRADE., MD    Brief Narrative:  74 y.o. 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  Admission has been complicated by fluid overload, hypotension and respiratory insufficiency.  On 1/5 evening hrs had 4 episodes of VT triggering ICD shocks. Administered additional amiodarone  bolus. Milrinone  stopped. K 3.2 , Mg 1.6. has chronic LAD. W/ mild ST elevation. During events there was change in mental status with concern for possible seizure however this was felt primarily to be syncopal event associated with the VT.  Transferred to [facility] on 1/8. 1/9-hemoptysis-PCCM re-consulted 1/9-LUE DVT 1/10- heparin  started    Assessment and Plan:  Acute hypoxic respiratory failure secondary to diffuse pulmonary infiltrates consistent with Worsening pulmonary edema, complicated further by small bilateral pleural effusions, +/- pneumonia -improving  - Required increasing amount of oxygen overnight-CTA is suggestive of pleural effusion/fluid but no pulmonary emboli -continue scheduled bronchodilators -Currently day #7/7 Zosyn  (sputum culture showing Pseudomonas)-repeat culture pending   Acute on chronic systolic heart failure.  Baseline EF 30 to 35%, current EF  remains the same.  There is left ventricular regional wall abnormalities RV size moderately enlarged with moderately enlarged pulmonary artery systolic pressures Continue telemetry monitoring Aggressive diuresis Per cardiology team   UE duplex: positive for DVT in the left axillary vein and positive for an SVT in the cephalic vein.  -hematolysis resolved -will start heparin  (after discussion with vascular- can change to DOAC)  Acute on Chronic Anemia etiology unclear Monitor Hb. -Status post PRBC   History of known coronary artery disease with chronically occluded LAD.  Currently with ST  elevation in V1 and V2, with elevated troponin.  Troponin is flat.  Likely demand ischemia.   -Defer to cardiology   VT.  Status post ICD shock x 4 amiodarone  infusion changed to p.o. Ensure potassium greater than 4 and magnesium  greater than 2 Telemetry monitoring -Defer to cardiology   Lactic acidosis. -resolved   Brief change in mental status-delirium  CT head shows nothing acute.  EEG shows no seizures. -Appears to be back to baseline   Acute on chronic hyponatremia.  Suspect secondary to heart failure. Status post tolvaptan  on 08/06/2024. Daily labs    Hypokalemia, hypochloremia, Hypomagnesemia. -replete   Hypothyroidism Continuing Synthroid    Critical limb ischemia status post right sided axillobifemoral bypass with right common femoral endarterectomy on 12/29 Mild bleeding for heparin  shot from left arm -Aspirin  as well as statin - no plan for plavix  in light of DOAC need     Hyperglycemia. -SSI  Pressure ulcers Wound 07/24/24 1830 Pressure Injury Sacrum Mid Stage 3 -  Full thickness tissue loss. Subcutaneous fat may be visible but bone, tendon or muscle are NOT exposed. (Active)     Wound 08/05/24 Pressure Injury Buttocks Right;Left Stage 3 -  Full thickness tissue loss. Subcutaneous fat may be visible but bone, tendon or muscle are NOT exposed. (Active)     ? CIR  DVT prophylaxis: SCDs Start: 08/05/24 2136 PLACE TED HOSE Start: 07/31/24 1415    Code Status: Full Code   Disposition Plan:  Level of care: Progressive Status is: Inpatient     Consultants:  Cardiology PCCM Neurology Vascular   Subjective: No further hemoptysis    Objective: Vitals:   08/09/24 2250 08/10/24 0415 08/10/24 0807 08/10/24 1100  BP: (!) 113/49  (!) 108/55   Pulse: 78  76   Resp: 13  (!) 22   Temp: 97.9 F (36.6 C) 98.3 F (36.8 C) 97.9 F (36.6 C)   TempSrc: Oral Oral Oral Oral  SpO2: 94%  93%   Weight:  62.8 kg    Height:        Intake/Output Summary  (Last 24 hours) at 08/10/2024 1314 Last data filed at 08/10/2024 0700 Gross per 24 hour  Intake 240 ml  Output 300 ml  Net -60 ml   Filed Weights   08/08/24 0347 08/09/24 0351 08/10/24 0415  Weight: 60 kg 62.1 kg 62.8 kg    Examination:   General: Appearance:  Elderly frail female in no acute distress     Lungs:   Diminished  Heart:    Normal heart rate.    MS:   All extremities are intact.    Neurologic:   Awake, alert       Data Reviewed: I have personally reviewed following labs and imaging studies  CBC: Recent Labs  Lab 08/06/24 0501 08/06/24 0622 08/06/24 1143 08/06/24 1640 08/07/24 0158 08/07/24 0905 08/07/24 0915 08/07/24 1245 08/08/24 0543 08/09/24 0520 08/10/24 0615  WBC 9.1   < > 10.8*   < > 9.9  --   --  12.7* 9.7 10.9* 14.4*  NEUTROABS 7.8*  --  9.3*  --   --   --   --   --   --   --   --   HGB 7.8*   < > 7.5*   < > 6.9*   < > 7.8* 8.6* 8.2* 8.5* 8.2*  HCT 22.2*   < > 21.7*   < > 20.0*   < > 23.0* 25.7* 23.9* 25.1* 24.1*  MCV 89.9   < > 91.2   < > 92.2  --   --  93.1 88.8 90.6 90.6  PLT 374   < > 389   < > 337  --   --  351 333 340 335   < > = values in this interval not displayed.   Basic Metabolic Panel: Recent Labs  Lab 08/06/24 0200 08/06/24 0500 08/06/24 1640 08/06/24 2331 08/07/24 0158 08/07/24 0905 08/07/24 1935 08/08/24 0015 08/08/24 0543 08/09/24 0520 08/10/24 0615  NA 122*   < > 126*   < > 131*   < > 129* 130* 130* 130* 129*  K 3.4*   < > 3.6   < > 3.7   < > 3.9 3.8 4.1 4.7 4.3  CL 84*   < > 87*   < > 90*   < > 88* 89* 90* 92* 90*  CO2 26   < > 27   < > 29   < > 29 30 31 29  32  GLUCOSE 191*   < > 213*   < > 160*   < > 90 137* 102* 109* 102*  BUN 23   < > 19   < > 21   < > 30* 33* 32* 30* 34*  CREATININE 0.94   < > 0.95   < > 1.00   < > 1.10* 1.12* 1.02* 0.96 1.16*  CALCIUM  8.9   < > 8.0*   < > 7.6*   < > 8.6* 8.7* 9.3 9.4 9.5  MG 2.3   < > 2.3  --  2.2  --   --   --  2.1 2.0 1.9  PHOS 3.0  --   --   --   --   --   --   --    --   --   --    < > =  values in this interval not displayed.   GFR: Estimated Creatinine Clearance: 35.7 mL/min (A) (by C-G formula based on SCr of 1.16 mg/dL (H)). Liver Function Tests: Recent Labs  Lab 08/04/24 0500 08/06/24 1143  AST 34 36  ALT 17 21  ALKPHOS 64 54  BILITOT 0.4 0.4  PROT 5.5* 5.3*  ALBUMIN  3.4* 3.2*   No results for input(s): LIPASE, AMYLASE in the last 168 hours. No results for input(s): AMMONIA in the last 168 hours. Coagulation Profile: Recent Labs  Lab 08/06/24 1640  INR 1.1   Cardiac Enzymes: No results for input(s): CKTOTAL, CKMB, CKMBINDEX, TROPONINI in the last 168 hours. BNP (last 3 results) Recent Labs    07/24/24 1016 08/02/24 1551 08/09/24 1626  PROBNP 4,537.0* 5,039.0* 8,218.0*   HbA1C: No results for input(s): HGBA1C in the last 72 hours. CBG: Recent Labs  Lab 08/09/24 1100 08/09/24 1625 08/09/24 2128 08/10/24 0610 08/10/24 1140  GLUCAP 112* 93 118* 117* 118*   Lipid Profile: No results for input(s): CHOL, HDL, LDLCALC, TRIG, CHOLHDL, LDLDIRECT in the last 72 hours. Thyroid  Function Tests: No results for input(s): TSH, T4TOTAL, FREET4, T3FREE, THYROIDAB in the last 72 hours. Anemia Panel: No results for input(s): VITAMINB12, FOLATE, FERRITIN, TIBC, IRON , RETICCTPCT in the last 72 hours.  Sepsis Labs: Recent Labs  Lab 08/05/24 2103 08/06/24 0133 08/06/24 0343  LATICACIDVEN 5.7* 2.1* 1.1    Recent Results (from the past 240 hours)  Respiratory (~20 pathogens) panel by PCR     Status: None   Collection Time: 08/02/24 12:34 PM   Specimen: Nasopharyngeal Swab; Respiratory  Result Value Ref Range Status   Adenovirus NOT DETECTED NOT DETECTED Final   Coronavirus 229E NOT DETECTED NOT DETECTED Final    Comment: (NOTE) The Coronavirus on the Respiratory Panel, DOES NOT test for the novel  Coronavirus (2019 nCoV)    Coronavirus HKU1 NOT DETECTED NOT DETECTED Final    Coronavirus NL63 NOT DETECTED NOT DETECTED Final   Coronavirus OC43 NOT DETECTED NOT DETECTED Final   Metapneumovirus NOT DETECTED NOT DETECTED Final   Rhinovirus / Enterovirus NOT DETECTED NOT DETECTED Final   Influenza A NOT DETECTED NOT DETECTED Final   Influenza B NOT DETECTED NOT DETECTED Final   Parainfluenza Virus 1 NOT DETECTED NOT DETECTED Final   Parainfluenza Virus 2 NOT DETECTED NOT DETECTED Final   Parainfluenza Virus 3 NOT DETECTED NOT DETECTED Final   Parainfluenza Virus 4 NOT DETECTED NOT DETECTED Final   Respiratory Syncytial Virus NOT DETECTED NOT DETECTED Final   Bordetella pertussis NOT DETECTED NOT DETECTED Final   Bordetella Parapertussis NOT DETECTED NOT DETECTED Final   Chlamydophila pneumoniae NOT DETECTED NOT DETECTED Final   Mycoplasma pneumoniae NOT DETECTED NOT DETECTED Final    Comment: Performed at [facility], [address], [place], [place] [address]  Resp panel by RT-PCR (RSV, Flu A&B, Covid) Anterior Nasal Swab     Status: None   Collection Time: 08/02/24 12:34 PM   Specimen: Anterior Nasal Swab  Result Value Ref Range Status   SARS Coronavirus 2 by RT PCR NEGATIVE NEGATIVE Final   Influenza A by PCR NEGATIVE NEGATIVE Final   Influenza B by PCR NEGATIVE NEGATIVE Final    Comment: (NOTE) The Xpert Xpress SARS-CoV-2/FLU/RSV plus assay is intended as an aid in the diagnosis of influenza from Nasopharyngeal swab specimens and should not be used as a sole basis for treatment. Nasal washings and aspirates are unacceptable for Xpert Xpress SARS-CoV-2/FLU/RSV testing.  Fact  Sheet for Patients: bloggercourse.com  Fact Sheet for Healthcare Providers: seriousbroker.it  This test is not yet approved or cleared by the [place]  FDA and has been authorized for detection and/or diagnosis of SARS-CoV-2 by FDA under an Emergency Use Authorization (EUA). This EUA will remain in effect (meaning this  test can be used) for the duration of the COVID-19 declaration under Section 564(b)(1) of the Act, 21 U.S.C. section 360bbb-3(b)(1), unless the authorization is terminated or revoked.     Resp Syncytial Virus by PCR NEGATIVE NEGATIVE Final    Comment: (NOTE) Fact Sheet for Patients: bloggercourse.com  Fact Sheet for Healthcare Providers: seriousbroker.it  This test is not yet approved or cleared by the [place]  FDA and has been authorized for detection and/or diagnosis of SARS-CoV-2 by FDA under an Emergency Use Authorization (EUA). This EUA will remain in effect (meaning this test can be used) for the duration of the COVID-19 declaration under Section 564(b)(1) of the Act, 21 U.S.C. section 360bbb-3(b)(1), unless the authorization is terminated or revoked.  Performed at [facility], [address] [place], [place] [address]   Expectorated Sputum Assessment w Gram Stain, Rflx to Resp Cult     Status: None   Collection Time: 08/04/24 11:48 AM   Specimen: Sputum  Result Value Ref Range Status   Specimen Description SPU  Final   Special Requests SPU  Final   Sputum evaluation   Final    THIS SPECIMEN IS ACCEPTABLE FOR SPUTUM CULTURE Performed at [facility], [address] [place], [place] [address]    Report Status 08/05/2024 FINAL  Final  Culture, Respiratory w Gram Stain     Status: None   Collection Time: 08/04/24 11:48 AM   Specimen: Sputum  Result Value Ref Range Status   Specimen Description SPU  Final   Special Requests SPU Reflexed from [id number]  Final   Gram Stain   Final    RARE WBC SEEN NO ORGANISMS SEEN Performed at [facility], [address], [place], [place] [address]    Culture FEW PSEUDOMONAS AERUGINOSA  Final   Report Status 08/08/2024 FINAL  Final   Organism ID, Bacteria PSEUDOMONAS AERUGINOSA  Final      Susceptibility   Pseudomonas aeruginosa - MIC*    MEROPENEM 2  SENSITIVE Sensitive     CIPROFLOXACIN <=0.06 SENSITIVE Sensitive     IMIPENEM >=16 RESISTANT Resistant     PIP/TAZO Value in next row Sensitive      <=4 SENSITIVEThis is a modified FDA-approved test that has been validated and its performance characteristics determined by the reporting laboratory.  This laboratory is certified under the Clinical Laboratory Improvement Amendments CLIA as qualified to perform high complexity clinical laboratory testing.    CEFEPIME  Value in next row Sensitive      <=4 SENSITIVEThis is a modified FDA-approved test that has been validated and its performance characteristics determined by the reporting laboratory.  This laboratory is certified under the Clinical Laboratory Improvement Amendments CLIA as qualified to perform high complexity clinical laboratory testing.    CEFTAZIDIME/AVIBACTAM Value in next row Sensitive      <=4 SENSITIVEThis is a modified FDA-approved test that has been validated and its performance characteristics determined by the reporting laboratory.  This laboratory is certified under the Clinical Laboratory Improvement Amendments CLIA as qualified to perform high complexity clinical laboratory testing.    CEFTOLOZANE/TAZOBACTAM Value in next row Sensitive      <=4 SENSITIVEThis is a  modified FDA-approved test that has been validated and its performance characteristics determined by the reporting laboratory.  This laboratory is certified under the Clinical Laboratory Improvement Amendments CLIA as qualified to perform high complexity clinical laboratory testing.    TOBRAMYCIN Value in next row Sensitive      <=4 SENSITIVEThis is a modified FDA-approved test that has been validated and its performance characteristics determined by the reporting laboratory.  This laboratory is certified under the Clinical Laboratory Improvement Amendments CLIA as qualified to perform high complexity clinical laboratory testing.    CEFTAZIDIME Value in next row Sensitive       <=4 SENSITIVEThis is a modified FDA-approved test that has been validated and its performance characteristics determined by the reporting laboratory.  This laboratory is certified under the Clinical Laboratory Improvement Amendments CLIA as qualified to perform high complexity clinical laboratory testing.    * FEW PSEUDOMONAS AERUGINOSA  MRSA Next Gen by PCR, Nasal     Status: None   Collection Time: 08/04/24 12:30 PM   Specimen: Nasal Mucosa; Nasal Swab  Result Value Ref Range Status   MRSA by PCR Next Gen NOT DETECTED NOT DETECTED Final    Comment: (NOTE) The GeneXpert MRSA Assay (FDA approved for NASAL specimens only), is one component of a comprehensive MRSA colonization surveillance program. It is not intended to diagnose MRSA infection nor to guide or monitor treatment for MRSA infections. Test performance is not FDA approved in patients less than 38 years old. Performed at [facility], [address] [place], [place] [address]   MRSA Next Gen by PCR, Nasal     Status: None   Collection Time: 08/06/24 12:57 AM   Specimen: Nasal Mucosa; Nasal Swab  Result Value Ref Range Status   MRSA by PCR Next Gen NOT DETECTED NOT DETECTED Final    Comment: (NOTE) The GeneXpert MRSA Assay (FDA approved for NASAL specimens only), is one component of a comprehensive MRSA colonization surveillance program. It is not intended to diagnose MRSA infection nor to guide or monitor treatment for MRSA infections. Test performance is not FDA approved in patients less than 63 years old. Performed at [facility], [address] [place], [place] [address]   Respiratory (~20 pathogens) panel by PCR     Status: None   Collection Time: 08/06/24  8:17 AM   Specimen: Nasopharyngeal Swab; Respiratory  Result Value Ref Range Status   Adenovirus NOT DETECTED NOT DETECTED Final   Coronavirus 229E NOT DETECTED NOT DETECTED Final    Comment: (NOTE) The Coronavirus on the Respiratory Panel,  DOES NOT test for the novel  Coronavirus (2019 nCoV)    Coronavirus HKU1 NOT DETECTED NOT DETECTED Final   Coronavirus NL63 NOT DETECTED NOT DETECTED Final   Coronavirus OC43 NOT DETECTED NOT DETECTED Final   Metapneumovirus NOT DETECTED NOT DETECTED Final   Rhinovirus / Enterovirus NOT DETECTED NOT DETECTED Final   Influenza A NOT DETECTED NOT DETECTED Final   Influenza B NOT DETECTED NOT DETECTED Final   Parainfluenza Virus 1 NOT DETECTED NOT DETECTED Final   Parainfluenza Virus 2 NOT DETECTED NOT DETECTED Final   Parainfluenza Virus 3 NOT DETECTED NOT DETECTED Final   Parainfluenza Virus 4 NOT DETECTED NOT DETECTED Final   Respiratory Syncytial Virus NOT DETECTED NOT DETECTED Final   Bordetella pertussis NOT DETECTED NOT DETECTED Final   Bordetella Parapertussis NOT DETECTED NOT DETECTED Final   Chlamydophila pneumoniae NOT DETECTED NOT DETECTED Final   Mycoplasma pneumoniae  NOT DETECTED NOT DETECTED Final    Comment: Performed at [facility], [address] [place], [place] [address]  Surgical pcr screen     Status: None   Collection Time: 08/07/24  1:58 AM   Specimen: Nasal Mucosa; Nasal Swab  Result Value Ref Range Status   MRSA, PCR NEGATIVE NEGATIVE Final   Staphylococcus aureus NEGATIVE NEGATIVE Final    Comment: (NOTE) The Xpert SA Assay (FDA approved for NASAL specimens in patients 45 years of age and older), is one component of a comprehensive surveillance program. It is not intended to diagnose infection nor to guide or monitor treatment. Performed at [facility], [address], [place], [place] [address]   Expectorated Sputum Assessment w Gram Stain, Rflx to Resp Cult     Status: None   Collection Time: 08/09/24  7:44 AM   Specimen: Expectorated Sputum  Result Value Ref Range Status   Specimen Description EXPECTORATED SPUTUM  Final   Special Requests NONE  Final   Sputum evaluation   Final    THIS SPECIMEN IS ACCEPTABLE FOR SPUTUM  CULTURE Performed at [facility], [address], [place], [place] [address]    Report Status 08/09/2024 FINAL  Final  Culture, Respiratory w Gram Stain     Status: None (Preliminary result)   Collection Time: 08/09/24  7:44 AM  Result Value Ref Range Status   Specimen Description EXPECTORATED SPUTUM  Final   Special Requests NONE Reflexed from [id number]  Final   Gram Stain   Final    FEW WBC PRESENT, PREDOMINANTLY PMN NO ORGANISMS SEEN Performed at [facility], [address], [place], [place] [address]    Culture PENDING  Incomplete   Report Status PENDING  Incomplete         Radiology Studies: VAS US  UPPER EXTREMITY VENOUS DUPLEX Result Date: 08/10/2024 UPPER VENOUS STUDY  Patient Name:  BLAKELYNN SCHEELER  Date of Exam:   08/09/2024 Medical Rec #: [id number]    Accession #:    [id number] Date of Birth: 1950-09-23   Patient Gender: F Patient Age:   38 years Exam Location:  [facility] Procedure:      VAS US  UPPER EXTREMITY VENOUS DUPLEX Referring Phys: MANUELITA [facility] --------------------------------------------------------------------------------  Other Indications: Left upper extremity swelling. Comparison Study: No prior exam. Performing Technologist: Edilia Elden Appl  Examination Guidelines: A complete evaluation includes B-mode imaging, spectral Doppler, color Doppler, and power Doppler as needed of all accessible portions of each vessel. Bilateral testing is considered an integral part of a complete examination. Limited examinations for reoccurring indications may be performed as noted.  Right Findings: +----------+------------+---------+-----------+----------+-------+ RIGHT     CompressiblePhasicitySpontaneousPropertiesSummary +----------+------------+---------+-----------+----------+-------+ IJV           Full       Yes       Yes                      +----------+------------+---------+-----------+----------+-------+ Subclavian    Full       Yes       Yes                       +----------+------------+---------+-----------+----------+-------+  Left Findings: +----------+------------+---------+-----------+----------+-----------+ LEFT      CompressiblePhasicitySpontaneousProperties  Summary   +----------+------------+---------+-----------+----------+-----------+ IJV           Full       Yes       Yes  Resistance. +----------+------------+---------+-----------+----------+-----------+ Subclavian    Full       Yes       Yes                          +----------+------------+---------+-----------+----------+-----------+ Axillary    Partial      Yes       Yes                          +----------+------------+---------+-----------+----------+-----------+ Brachial      Full       Yes       Yes                          +----------+------------+---------+-----------+----------+-----------+ Radial        Full                                              +----------+------------+---------+-----------+----------+-----------+ Ulnar         Full                                              +----------+------------+---------+-----------+----------+-----------+ Basilic       Full       Yes       Yes                          +----------+------------+---------+-----------+----------+-----------+ Deep vein thrombosis noted in the left axillary vein. Superficial vein thrombosis noted in the left cephalic vein at the antecubital fossa and in the distal forearm by the IV line.  Summary:  Right: No evidence of thrombosis in the subclavian.  Left: Findings consistent with acute deep vein thrombosis involving the left axillary vein. Findings consistent with acute superficial vein thrombosis involving the left cephalic vein.  *See table(s) above for measurements and observations.  Diagnosing physician: Fonda Rim Electronically signed by Fonda Rim on 08/10/2024 at 8:34:59 AM.    Final    DG Chest 2 View Result Date:  08/09/2024 EXAM: 2 VIEW(S) XRAY OF THE CHEST 08/09/2024 09:29:00 AM COMPARISON: 08/06/2024 CLINICAL HISTORY: Chronic bilateral pleural effusions FINDINGS: LINES, TUBES AND DEVICES: Right PICC line in place. LUNGS AND PLEURA: Bilateral airspace disease consistent with pulmonary edema. Bilateral pleural effusions. No pneumothorax. HEART AND MEDIASTINUM: Cardiomegaly. Left pacemaker. Calcified aorta. BONES AND SOFT TISSUES: No acute osseous abnormality. IMPRESSION: 1. Similar appearance of likely pulmonary edema with cardiomegaly and bilateral pleural effusions. Electronically signed by: Michaeline Blanch MD 08/09/2024 04:25 PM EST RP Workstation: [id number]   CT Angio Chest Pulmonary Embolism (PE) W or WO Contrast Result Date: 08/08/2024 EXAM: CTA of the Chest with contrast for PE 08/08/2024 11:22:18 PM TECHNIQUE: CTA of the chest was performed without and with the administration of 75 mL of intravenous contrast (iohexol  (OMNIPAQUE ) 350 MG/ML injection 75 mL IOHEXOL  350 MG/ML SOLN). Multiplanar reformatted images are provided for review. MIP images are provided for review. Automated exposure control, iterative reconstruction, and/or weight based adjustment of the mA/kV was utilized to reduce the radiation dose to as low as reasonably achievable. COMPARISON: Chest radiograph 08/07/2023 and CT chest 01/16/2024. CLINICAL HISTORY: Pulmonary embolism (PE) suspected, high prob; hemoptysis.  Hemoptysis with increased oxygen requirement. FINDINGS: PULMONARY ARTERIES: Pulmonary arteries are adequately opacified for evaluation. No pulmonary embolism. Main pulmonary artery is normal in caliber. MEDIASTINUM: Treatment enhancement. No pericardial effusions. Calcification of the aorta and coronary arteries. No aortic aneurysm. Cardiac pacemaker. Esophagus is decompressed. The thyroid  gland is unremarkable. LYMPH NODES: No significant lymphadenopathy. LUNGS AND PLEURA: Emphysematous changes throughout the lungs. Airspace and interstitial  infiltrates throughout the lungs. This could represent pneumonia, edema, or aspiration. Airways are patent. Large bilateral pleural effusions. No pneumothorax. UPPER ABDOMEN: Limited images of the upper abdomen are unremarkable. SOFT TISSUES AND BONES: Degenerative changes in the spine. No acute bone or soft tissue abnormality. IMPRESSION: 1. No pulmonary embolism. 2. Airspace and interstitial infiltrates throughout the lungs, possibly representing pneumonia, edema, or aspiration. 3. Large bilateral pleural effusions. 4. Emphysematous changes throughout the lungs, and pulmonary emphysema is an independent risk factor for lung cancer and recommend consideration for evaluation for a low-dose CT lung cancer screening program. Electronically signed by: Elsie Gravely MD 08/08/2024 11:29 PM EST RP Workstation: [id number]        Scheduled Meds:  amiodarone   200 mg Oral BID   arformoterol   15 mcg Nebulization BID   aspirin  EC  81 mg Oral Q0600   atorvastatin   80 mg Oral Daily   Chlorhexidine  Gluconate Cloth  6 each Topical Daily   feeding supplement  237 mL Oral BID BM   insulin  aspart  0-6 Units Subcutaneous TID WC   ipratropium-albuterol   3 mL Nebulization TID   levothyroxine   37.5 mcg Oral Q0600   mirtazapine   7.5 mg Oral QHS   mouth rinse  15 mL Mouth Rinse 4 times per day   ranolazine   1,000 mg Oral BID   revefenacin   175 mcg Nebulization Daily   sacubitril -valsartan   1 tablet Oral BID   sodium chloride  flush  10-40 mL Intracatheter Q12H   sodium chloride  flush  3 mL Intravenous Q12H   spironolactone   25 mg Oral Daily   valACYclovir   1,000 mg Oral Daily   Continuous Infusions:  piperacillin -tazobactam (ZOSYN )  IV 3.375 g (08/10/24 0827)     LOS: 17 days    Time spent: 45 minutes spent on chart review, discussion with nursing staff, consultants, updating family and interview/physical exam; more than 50% of that time was spent in counseling and/or coordination of care.    Harlene RAYMOND Bowl, DO [facility] Available via Epic secure chat 7am-7pm After these hours, please refer to coverage provider listed on [email or web address] 08/10/2024, 1:14 PM   "

## 2024-08-11 DIAGNOSIS — I2510 Atherosclerotic heart disease of native coronary artery without angina pectoris: Secondary | ICD-10-CM | POA: Diagnosis not present

## 2024-08-11 DIAGNOSIS — I5020 Unspecified systolic (congestive) heart failure: Secondary | ICD-10-CM | POA: Diagnosis not present

## 2024-08-11 DIAGNOSIS — Z95810 Presence of automatic (implantable) cardiac defibrillator: Secondary | ICD-10-CM | POA: Diagnosis not present

## 2024-08-11 LAB — HEPARIN LEVEL (UNFRACTIONATED)
Heparin Unfractionated: 0.28 [IU]/mL — ABNORMAL LOW (ref 0.30–0.70)
Heparin Unfractionated: 0.35 [IU]/mL (ref 0.30–0.70)
Heparin Unfractionated: 0.27 [IU]/mL — ABNORMAL LOW (ref 0.30–0.70)

## 2024-08-11 LAB — BASIC METABOLIC PANEL WITH GFR
Anion gap: 8 (ref 5–15)
BUN: 34 mg/dL — ABNORMAL HIGH (ref 8–23)
CO2: 31 mmol/L (ref 22–32)
Calcium: 9.6 mg/dL (ref 8.9–10.3)
Chloride: 89 mmol/L — ABNORMAL LOW (ref 98–111)
Creatinine, Ser: 1.4 mg/dL — ABNORMAL HIGH (ref 0.44–1.00)
GFR, Estimated: 40 mL/min — ABNORMAL LOW
Glucose, Bld: 101 mg/dL — ABNORMAL HIGH (ref 70–99)
Potassium: 3.9 mmol/L (ref 3.5–5.1)
Sodium: 128 mmol/L — ABNORMAL LOW (ref 135–145)

## 2024-08-11 LAB — GLUCOSE, CAPILLARY
Glucose-Capillary: 98 mg/dL (ref 70–99)
Glucose-Capillary: 100 mg/dL — ABNORMAL HIGH (ref 70–99)
Glucose-Capillary: 104 mg/dL — ABNORMAL HIGH (ref 70–99)
Glucose-Capillary: 111 mg/dL — ABNORMAL HIGH (ref 70–99)

## 2024-08-11 LAB — CBC
HCT: 25.2 % — ABNORMAL LOW (ref 36.0–46.0)
Hemoglobin: 8.6 g/dL — ABNORMAL LOW (ref 12.0–15.0)
MCH: 31.4 pg (ref 26.0–34.0)
MCHC: 34.1 g/dL (ref 30.0–36.0)
MCV: 92 fL (ref 80.0–100.0)
Platelets: 392 K/uL (ref 150–400)
RBC: 2.74 MIL/uL — ABNORMAL LOW (ref 3.87–5.11)
RDW: 15.9 % — ABNORMAL HIGH (ref 11.5–15.5)
WBC: 15.5 K/uL — ABNORMAL HIGH (ref 4.0–10.5)
nRBC: 0 % (ref 0.0–0.2)

## 2024-08-11 LAB — MAGNESIUM: Magnesium: 1.9 mg/dL (ref 1.7–2.4)

## 2024-08-11 LAB — CULTURE, RESPIRATORY W GRAM STAIN

## 2024-08-11 MED ORDER — SODIUM CHLORIDE 0.9 % IV SOLN
2.0000 g | INTRAVENOUS | Status: DC
  Administered 2024-08-11: 2 g via INTRAVENOUS
  Filled 2024-08-11: qty 12.5

## 2024-08-11 MED ORDER — IPRATROPIUM-ALBUTEROL 0.5-2.5 (3) MG/3ML IN SOLN
3.0000 mL | Freq: Two times a day (BID) | RESPIRATORY_TRACT | Status: DC
  Administered 2024-08-11 – 2024-08-13 (×4): 3 mL via RESPIRATORY_TRACT
  Filled 2024-08-11 (×4): qty 3

## 2024-08-11 MED ORDER — FUROSEMIDE 20 MG PO TABS
20.0000 mg | ORAL_TABLET | Freq: Every day | ORAL | Status: DC
  Administered 2024-08-11: 20 mg via ORAL
  Filled 2024-08-11: qty 1

## 2024-08-11 MED ORDER — SACUBITRIL-VALSARTAN 24-26 MG PO TABS: 1.0000 | ORAL_TABLET | Freq: Two times a day (BID) | ORAL | Status: DC

## 2024-08-11 MED ORDER — MIDODRINE HCL 5 MG PO TABS
2.5000 mg | ORAL_TABLET | Freq: Once | ORAL | Status: AC
  Administered 2024-08-11: 2.5 mg via ORAL
  Filled 2024-08-11: qty 1

## 2024-08-11 NOTE — Progress Notes (Signed)
 "  Cardiology:  [facility]  Subjective:  Denies SSCP, palpitations or Dyspnea Upd to bathroom. Eating breakfast   Objective:  Vitals:   08/10/24 2009 08/10/24 2325 08/11/24 0123 08/11/24 0400  BP:  (!) 134/52  (!) 106/94  Pulse: 73 78  71  Resp: (!) 21 20  16   Temp:  97.7 F (36.5 C)  98.6 F (37 C)  TempSrc:  Oral  Oral  SpO2: 97% 96%  99%  Weight:   61.5 kg   Height:        Intake/Output from previous day:  Intake/Output Summary (Last 24 hours) at 08/11/2024 0741 Last data filed at 08/11/2024 0340 Gross per 24 hour  Intake 1513.78 ml  Output 300 ml  Net 1213.78 ml    Physical Exam:  Elderly female AICD under left clavicle no murmur LUE some swelling Post right Ax-Fem with wound vac over femoral insertion site No edema   Lab Results: Basic Metabolic Panel: Recent Labs    08/10/24 0615 08/11/24 0500  NA 129* 128*  K 4.3 3.9  CL 90* 89*  CO2 32 31  GLUCOSE 102* 101*  BUN 34* 34*  CREATININE 1.16* 1.40*  CALCIUM  9.5 9.6  MG 1.9 1.9   Liver Function Tests: No results for input(s): AST, ALT, ALKPHOS, BILITOT, PROT, ALBUMIN  in the last 72 hours. No results for input(s): LIPASE, AMYLASE in the last 72 hours. CBC: Recent Labs    08/10/24 0615 08/11/24 0500  WBC 14.4* 15.5*  HGB 8.2* 8.6*  HCT 24.1* 25.2*  MCV 90.6 92.0  PLT 335 392     Imaging: VAS US  UPPER EXTREMITY VENOUS DUPLEX Result Date: 08/10/2024 UPPER VENOUS STUDY  Patient Name:  Pam [name]  Date of Exam:   08/09/2024 Medical Rec #: [id number]    Accession #:    [id number] Date of Birth: 11/29/1950   Patient Gender: F Patient Age:   74 years Exam Location:  [facility] Procedure:      VAS US  UPPER EXTREMITY VENOUS DUPLEX Referring Phys: MANUELITA [facility] --------------------------------------------------------------------------------  Other Indications: Left upper extremity swelling. Comparison Study: No prior exam. Performing Technologist: Edilia Elden Appl  Examination  Guidelines: A complete evaluation includes B-mode imaging, spectral Doppler, color Doppler, and power Doppler as needed of all accessible portions of each vessel. Bilateral testing is considered an integral part of a complete examination. Limited examinations for reoccurring indications may be performed as noted.  Right Findings: +----------+------------+---------+-----------+----------+-------+ RIGHT     CompressiblePhasicitySpontaneousPropertiesSummary +----------+------------+---------+-----------+----------+-------+ IJV           Full       Yes       Yes                      +----------+------------+---------+-----------+----------+-------+ Subclavian    Full       Yes       Yes                      +----------+------------+---------+-----------+----------+-------+  Left Findings: +----------+------------+---------+-----------+----------+-----------+ LEFT      CompressiblePhasicitySpontaneousProperties  Summary   +----------+------------+---------+-----------+----------+-----------+ IJV           Full       Yes       Yes              Resistance. +----------+------------+---------+-----------+----------+-----------+ Subclavian    Full       Yes       Yes                          +----------+------------+---------+-----------+----------+-----------+  Axillary    Partial      Yes       Yes                          +----------+------------+---------+-----------+----------+-----------+ Brachial      Full       Yes       Yes                          +----------+------------+---------+-----------+----------+-----------+ Radial        Full                                              +----------+------------+---------+-----------+----------+-----------+ Ulnar         Full                                              +----------+------------+---------+-----------+----------+-----------+ Basilic       Full       Yes       Yes                           +----------+------------+---------+-----------+----------+-----------+ Deep vein thrombosis noted in the left axillary vein. Superficial vein thrombosis noted in the left cephalic vein at the antecubital fossa and in the distal forearm by the IV line.  Summary:  Right: No evidence of thrombosis in the subclavian.  Left: Findings consistent with acute deep vein thrombosis involving the left axillary vein. Findings consistent with acute superficial vein thrombosis involving the left cephalic vein.  *See table(s) above for measurements and observations.  Diagnosing physician: Fonda Rim Electronically signed by Fonda Rim on 08/10/2024 at 8:34:59 AM.    Final    DG Chest 2 View Result Date: 08/09/2024 EXAM: 2 VIEW(S) XRAY OF THE CHEST 08/09/2024 09:29:00 AM COMPARISON: 08/06/2024 CLINICAL HISTORY: Chronic bilateral pleural effusions FINDINGS: LINES, TUBES AND DEVICES: Right PICC line in place. LUNGS AND PLEURA: Bilateral airspace disease consistent with pulmonary edema. Bilateral pleural effusions. No pneumothorax. HEART AND MEDIASTINUM: Cardiomegaly. Left pacemaker. Calcified aorta. BONES AND SOFT TISSUES: No acute osseous abnormality. IMPRESSION: 1. Similar appearance of likely pulmonary edema with cardiomegaly and bilateral pleural effusions. Electronically signed by: Michaeline Blanch MD 08/09/2024 04:25 PM EST RP Workstation: [id number]    Cardiac Studies:  ECG: Sinus Right axis low voltage   Telemetry:  NSR no VT  Echo: 08/01/24 EF 30-35% trivial MR RV normal   Medications:    amiodarone   200 mg Oral BID   arformoterol   15 mcg Nebulization BID   aspirin  EC  81 mg Oral Q0600   atorvastatin   80 mg Oral Daily   Chlorhexidine  Gluconate Cloth  6 each Topical Daily   feeding supplement  237 mL Oral BID BM   insulin  aspart  0-6 Units Subcutaneous TID WC   ipratropium-albuterol   3 mL Nebulization TID   levothyroxine   37.5 mcg Oral Q0600   mirtazapine   7.5 mg Oral QHS   mouth rinse  15 mL Mouth Rinse  4 times per day   ranolazine   1,000 mg Oral BID   revefenacin   175 mcg Nebulization Daily   sacubitril -valsartan   1 tablet Oral BID  sodium chloride  flush  10-40 mL Intracatheter Q12H   sodium chloride  flush  3 mL Intravenous Q12H   spironolactone   25 mg Oral Daily   valACYclovir   1,000 mg Oral Daily      heparin  1,000 Units/hr (08/11/24 0340)   piperacillin -tazobactam (ZOSYN )  IV 3.375 g (08/11/24 0603)    Assessment/Plan:   CHF:  no peripheral edema but some edema over right surgical site On aldactone  will add back lasix   given I/O's positive a liter K 3.9 Cr 1.4 EF 30-35% Right heart cath 1/7 showed normal filling pressures with mild PHT , normal CO and good RV pulsatility PAPi Continue entresto  and aldactone   VT:  quiescent on amiodarone  200 mg bid. AICD in place  DVT:  LUE on heparin  some swelling in arm  CAD:  distant LCX atherectomy and known total LAD mid vessel with collaterals No angina  PVD:  post Ax fem on right foot perfused Wound Vac in place per VVS   Pam [name] 08/11/2024, 7:41 AM    "

## 2024-08-11 NOTE — Progress Notes (Signed)
 " PROGRESS NOTE    Pam [name]  FMW:[id number] DOB: [birth year] DOA: 07/24/2024 PCP: Andrew Truman GRADE., MD    Brief Narrative:  74 y.o. 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  Admission has been complicated by fluid overload, hypotension and respiratory insufficiency.  On 1/5 evening hrs had 4 episodes of VT triggering ICD shocks. Administered additional amiodarone  bolus. Milrinone  stopped. K 3.2 , Mg 1.6. has chronic LAD. W/ mild ST elevation. During events there was change in mental status with concern for possible seizure however this was felt primarily to be syncopal event associated with the VT.  Transferred to [facility] on 1/8. 1/9-hemoptysis-PCCM re-consulted 1/9-LUE DVT 1/10- heparin  started 1/11-creatinine trending up and blood pressure trending down-Entresto  held    Assessment and Plan:  Acute hypoxic respiratory failure secondary to diffuse pulmonary infiltrates consistent with Worsening pulmonary edema, complicated further by small bilateral pleural effusions, +/- pneumonia -improving  - Required increasing amount of oxygen overnight-CTA is suggestive of pleural effusion/fluid but no pulmonary emboli -continue scheduled bronchodilators - Finish Zosyn  (sputum culture showing Pseudomonas)-repeat culture pending   Acute on chronic systolic heart failure.  Baseline EF 30 to 35%, current EF  remains the same.  There is left ventricular regional wall abnormalities RV size moderately enlarged with moderately enlarged pulmonary artery systolic pressures Continue telemetry monitoring -diuresis Per cardiology team -1/11: Had episodes of hypotension-have held Entresto  for today, creatinine also trending up to 1.4   UE duplex: positive for DVT in the left axillary vein and positive for an SVT in the cephalic vein.  -hematolysis resolved -will start heparin  (after discussion with vascular- can change to DOAC once we know patient's  not bleeding)  Acute on Chronic Anemia etiology unclear Monitor Hb. -Status post PRBC   History of known coronary artery disease with chronically occluded LAD.  Currently with ST elevation in V1 and V2, with elevated troponin.  Troponin is flat.  Likely demand ischemia.   -Defer to cardiology   VT.  Status post ICD shock x 4 amiodarone  infusion changed to p.o. Ensure potassium greater than 4 and magnesium  greater than 2 Telemetry monitoring -Defer to cardiology   Lactic acidosis. -resolved   Brief change in mental status-delirium  CT head shows nothing acute.  EEG shows no seizures. -Appears to be back to baseline   Acute on chronic hyponatremia.  Suspect secondary to heart failure. Status post tolvaptan  on 08/06/2024. Daily labs    Hypokalemia, hypochloremia, Hypomagnesemia. -replete   Hypothyroidism Continuing Synthroid    Critical limb ischemia status post right sided axillobifemoral bypass with right common femoral endarterectomy on 12/29 Mild bleeding for heparin  shot from left arm -Aspirin  as well as statin - no plan for plavix  in light of DOAC need     Hyperglycemia. -SSI  Pressure ulcers Wound 07/24/24 1830 Pressure Injury Sacrum Mid Stage 3 -  Full thickness tissue loss. Subcutaneous fat may be visible but bone, tendon or muscle are NOT exposed. (Active)     Wound 08/05/24 Pressure Injury Buttocks Right;Left Stage 3 -  Full thickness tissue loss. Subcutaneous fat may be visible but bone, tendon or muscle are NOT exposed. (Active)     Patient not progressing well, will discuss with heart failure team on 1/12 and consider palliative care consult.  DVT prophylaxis: SCDs Start: 08/05/24 2136 PLACE TED HOSE Start: 07/31/24 1415    Code Status: Full Code   Disposition Plan:  Level of care: Progressive Status is:  Inpatient     Consultants:  Cardiology PCCM Neurology Vascular   Subjective: No further hemoptysis    Objective: Vitals:   08/11/24  0123 08/11/24 0400 08/11/24 0815 08/11/24 1210  BP:  (!) 106/94 (!) 92/40 (!) 91/45  Pulse:  71  69  Resp:  16  18  Temp:  98.6 F (37 C) 97.9 F (36.6 C) 98 F (36.7 C)  TempSrc:  Oral Oral Oral  SpO2:  99%  100%  Weight: 61.5 kg     Height:        Intake/Output Summary (Last 24 hours) at 08/11/2024 1356 Last data filed at 08/11/2024 1247 Gross per 24 hour  Intake 1364.97 ml  Output 300 ml  Net 1064.97 ml   Filed Weights   08/09/24 0351 08/10/24 0415 08/11/24 0123  Weight: 62.1 kg 62.8 kg 61.5 kg    Examination:   General: Appearance:  Elderly frail female in no acute distress     Lungs:   Diminished  Heart:    Normal heart rate.    MS:   All extremities are intact.    Neurologic:   Awake, alert       Data Reviewed: I have personally reviewed following labs and imaging studies  CBC: Recent Labs  Lab 08/06/24 0501 08/06/24 0622 08/06/24 1143 08/06/24 1640 08/07/24 1245 08/08/24 0543 08/09/24 0520 08/10/24 0615 08/11/24 0500  WBC 9.1   < > 10.8*   < > 12.7* 9.7 10.9* 14.4* 15.5*  NEUTROABS 7.8*  --  9.3*  --   --   --   --   --   --   HGB 7.8*   < > 7.5*   < > 8.6* 8.2* 8.5* 8.2* 8.6*  HCT 22.2*   < > 21.7*   < > 25.7* 23.9* 25.1* 24.1* 25.2*  MCV 89.9   < > 91.2   < > 93.1 88.8 90.6 90.6 92.0  PLT 374   < > 389   < > 351 333 340 335 392   < > = values in this interval not displayed.   Basic Metabolic Panel: Recent Labs  Lab 08/06/24 0200 08/06/24 0500 08/07/24 0158 08/07/24 0905 08/08/24 0015 08/08/24 0543 08/09/24 0520 08/10/24 0615 08/11/24 0500  NA 122*   < > 131*   < > 130* 130* 130* 129* 128*  K 3.4*   < > 3.7   < > 3.8 4.1 4.7 4.3 3.9  CL 84*   < > 90*   < > 89* 90* 92* 90* 89*  CO2 26   < > 29   < > 30 31 29  32 31  GLUCOSE 191*   < > 160*   < > 137* 102* 109* 102* 101*  BUN 23   < > 21   < > 33* 32* 30* 34* 34*  CREATININE 0.94   < > 1.00   < > 1.12* 1.02* 0.96 1.16* 1.40*  CALCIUM  8.9   < > 7.6*   < > 8.7* 9.3 9.4 9.5 9.6  MG  2.3   < > 2.2  --   --  2.1 2.0 1.9 1.9  PHOS 3.0  --   --   --   --   --   --   --   --    < > = values in this interval not displayed.   GFR: Estimated Creatinine Clearance: 29.6 mL/min (A) (by C-G formula based on SCr of 1.4 mg/dL (H)). Liver Function  Tests: Recent Labs  Lab 08/06/24 1143  AST 36  ALT 21  ALKPHOS 54  BILITOT 0.4  PROT 5.3*  ALBUMIN  3.2*   No results for input(s): LIPASE, AMYLASE in the last 168 hours. No results for input(s): AMMONIA in the last 168 hours. Coagulation Profile: Recent Labs  Lab 08/06/24 1640  INR 1.1   Cardiac Enzymes: No results for input(s): CKTOTAL, CKMB, CKMBINDEX, TROPONINI in the last 168 hours. BNP (last 3 results) Recent Labs    07/24/24 1016 08/02/24 1551 08/09/24 1626  PROBNP 4,537.0* 5,039.0* 8,218.0*   HbA1C: No results for input(s): HGBA1C in the last 72 hours. CBG: Recent Labs  Lab 08/10/24 1140 08/10/24 1625 08/10/24 2058 08/11/24 0600 08/11/24 1209  GLUCAP 118* 110* 141* 98 100*   Lipid Profile: No results for input(s): CHOL, HDL, LDLCALC, TRIG, CHOLHDL, LDLDIRECT in the last 72 hours. Thyroid  Function Tests: No results for input(s): TSH, T4TOTAL, FREET4, T3FREE, THYROIDAB in the last 72 hours. Anemia Panel: No results for input(s): VITAMINB12, FOLATE, FERRITIN, TIBC, IRON , RETICCTPCT in the last 72 hours.  Sepsis Labs: Recent Labs  Lab 08/05/24 2103 08/06/24 0133 08/06/24 0343  LATICACIDVEN 5.7* 2.1* 1.1    Recent Results (from the past 240 hours)  Respiratory (~20 pathogens) panel by PCR     Status: None   Collection Time: 08/02/24 12:34 PM   Specimen: Nasopharyngeal Swab; Respiratory  Result Value Ref Range Status   Adenovirus NOT DETECTED NOT DETECTED Final   Coronavirus 229E NOT DETECTED NOT DETECTED Final    Comment: (NOTE) The Coronavirus on the Respiratory Panel, DOES NOT test for the novel  Coronavirus (2019 nCoV)    Coronavirus HKU1  NOT DETECTED NOT DETECTED Final   Coronavirus NL63 NOT DETECTED NOT DETECTED Final   Coronavirus OC43 NOT DETECTED NOT DETECTED Final   Metapneumovirus NOT DETECTED NOT DETECTED Final   Rhinovirus / Enterovirus NOT DETECTED NOT DETECTED Final   Influenza A NOT DETECTED NOT DETECTED Final   Influenza B NOT DETECTED NOT DETECTED Final   Parainfluenza Virus 1 NOT DETECTED NOT DETECTED Final   Parainfluenza Virus 2 NOT DETECTED NOT DETECTED Final   Parainfluenza Virus 3 NOT DETECTED NOT DETECTED Final   Parainfluenza Virus 4 NOT DETECTED NOT DETECTED Final   Respiratory Syncytial Virus NOT DETECTED NOT DETECTED Final   Bordetella pertussis NOT DETECTED NOT DETECTED Final   Bordetella Parapertussis NOT DETECTED NOT DETECTED Final   Chlamydophila pneumoniae NOT DETECTED NOT DETECTED Final   Mycoplasma pneumoniae NOT DETECTED NOT DETECTED Final    Comment: Performed at [facility], [address] [place], [place] [address]  Resp panel by RT-PCR (RSV, Flu A&B, Covid) Anterior Nasal Swab     Status: None   Collection Time: 08/02/24 12:34 PM   Specimen: Anterior Nasal Swab  Result Value Ref Range Status   SARS Coronavirus 2 by RT PCR NEGATIVE NEGATIVE Final   Influenza A by PCR NEGATIVE NEGATIVE Final   Influenza B by PCR NEGATIVE NEGATIVE Final    Comment: (NOTE) The Xpert Xpress SARS-CoV-2/FLU/RSV plus assay is intended as an aid in the diagnosis of influenza from Nasopharyngeal swab specimens and should not be used as a sole basis for treatment. Nasal washings and aspirates are unacceptable for Xpert Xpress SARS-CoV-2/FLU/RSV testing.  Fact Sheet for Patients: bloggercourse.com  Fact Sheet for Healthcare Providers: seriousbroker.it  This test is not yet approved or cleared by the [place]  FDA and has been authorized for detection and/or diagnosis of SARS-CoV-2  by FDA under an Emergency Use Authorization (EUA). This EUA will  remain in effect (meaning this test can be used) for the duration of the COVID-19 declaration under Section 564(b)(1) of the Act, 21 U.S.C. section 360bbb-3(b)(1), unless the authorization is terminated or revoked.     Resp Syncytial Virus by PCR NEGATIVE NEGATIVE Final    Comment: (NOTE) Fact Sheet for Patients: bloggercourse.com  Fact Sheet for Healthcare Providers: seriousbroker.it  This test is not yet approved or cleared by the [place]  FDA and has been authorized for detection and/or diagnosis of SARS-CoV-2 by FDA under an Emergency Use Authorization (EUA). This EUA will remain in effect (meaning this test can be used) for the duration of the COVID-19 declaration under Section 564(b)(1) of the Act, 21 U.S.C. section 360bbb-3(b)(1), unless the authorization is terminated or revoked.  Performed at [facility], [address] [place], [place] [address]   Expectorated Sputum Assessment w Gram Stain, Rflx to Resp Cult     Status: None   Collection Time: 08/04/24 11:48 AM   Specimen: Sputum  Result Value Ref Range Status   Specimen Description SPU  Final   Special Requests SPU  Final   Sputum evaluation   Final    THIS SPECIMEN IS ACCEPTABLE FOR SPUTUM CULTURE Performed at [facility], [address], [place], [place] [address]    Report Status 08/05/2024 FINAL  Final  Culture, Respiratory w Gram Stain     Status: None   Collection Time: 08/04/24 11:48 AM   Specimen: Sputum  Result Value Ref Range Status   Specimen Description SPU  Final   Special Requests SPU Reflexed from [id number]  Final   Gram Stain   Final    RARE WBC SEEN NO ORGANISMS SEEN Performed at [facility], [address], [place], [place] [address]    Culture FEW PSEUDOMONAS AERUGINOSA  Final   Report Status 08/08/2024 FINAL  Final   Organism ID, Bacteria PSEUDOMONAS AERUGINOSA  Final      Susceptibility   Pseudomonas aeruginosa  - MIC*    MEROPENEM 2 SENSITIVE Sensitive     CIPROFLOXACIN <=0.06 SENSITIVE Sensitive     IMIPENEM >=16 RESISTANT Resistant     PIP/TAZO Value in next row Sensitive      <=4 SENSITIVEThis is a modified FDA-approved test that has been validated and its performance characteristics determined by the reporting laboratory.  This laboratory is certified under the Clinical Laboratory Improvement Amendments CLIA as qualified to perform high complexity clinical laboratory testing.    CEFEPIME  Value in next row Sensitive      <=4 SENSITIVEThis is a modified FDA-approved test that has been validated and its performance characteristics determined by the reporting laboratory.  This laboratory is certified under the Clinical Laboratory Improvement Amendments CLIA as qualified to perform high complexity clinical laboratory testing.    CEFTAZIDIME/AVIBACTAM Value in next row Sensitive      <=4 SENSITIVEThis is a modified FDA-approved test that has been validated and its performance characteristics determined by the reporting laboratory.  This laboratory is certified under the Clinical Laboratory Improvement Amendments CLIA as qualified to perform high complexity clinical laboratory testing.    CEFTOLOZANE/TAZOBACTAM Value in next row Sensitive      <=4 SENSITIVEThis is a modified FDA-approved test that has been validated and its performance characteristics determined by the reporting laboratory.  This laboratory is certified under the Clinical Laboratory Improvement Amendments CLIA as qualified to perform high complexity clinical  laboratory testing.    TOBRAMYCIN Value in next row Sensitive      <=4 SENSITIVEThis is a modified FDA-approved test that has been validated and its performance characteristics determined by the reporting laboratory.  This laboratory is certified under the Clinical Laboratory Improvement Amendments CLIA as qualified to perform high complexity clinical laboratory testing.    CEFTAZIDIME Value  in next row Sensitive      <=4 SENSITIVEThis is a modified FDA-approved test that has been validated and its performance characteristics determined by the reporting laboratory.  This laboratory is certified under the Clinical Laboratory Improvement Amendments CLIA as qualified to perform high complexity clinical laboratory testing.    * FEW PSEUDOMONAS AERUGINOSA  MRSA Next Gen by PCR, Nasal     Status: None   Collection Time: 08/04/24 12:30 PM   Specimen: Nasal Mucosa; Nasal Swab  Result Value Ref Range Status   MRSA by PCR Next Gen NOT DETECTED NOT DETECTED Final    Comment: (NOTE) The GeneXpert MRSA Assay (FDA approved for NASAL specimens only), is one component of a comprehensive MRSA colonization surveillance program. It is not intended to diagnose MRSA infection nor to guide or monitor treatment for MRSA infections. Test performance is not FDA approved in patients less than 91 years old. Performed at [facility], [address] [place], [place] [address]   MRSA Next Gen by PCR, Nasal     Status: None   Collection Time: 08/06/24 12:57 AM   Specimen: Nasal Mucosa; Nasal Swab  Result Value Ref Range Status   MRSA by PCR Next Gen NOT DETECTED NOT DETECTED Final    Comment: (NOTE) The GeneXpert MRSA Assay (FDA approved for NASAL specimens only), is one component of a comprehensive MRSA colonization surveillance program. It is not intended to diagnose MRSA infection nor to guide or monitor treatment for MRSA infections. Test performance is not FDA approved in patients less than 30 years old. Performed at [facility], [address] [place], [place] [address]   Respiratory (~20 pathogens) panel by PCR     Status: None   Collection Time: 08/06/24  8:17 AM   Specimen: Nasopharyngeal Swab; Respiratory  Result Value Ref Range Status   Adenovirus NOT DETECTED NOT DETECTED Final   Coronavirus 229E NOT DETECTED NOT DETECTED Final    Comment: (NOTE) The Coronavirus on the  Respiratory Panel, DOES NOT test for the novel  Coronavirus (2019 nCoV)    Coronavirus HKU1 NOT DETECTED NOT DETECTED Final   Coronavirus NL63 NOT DETECTED NOT DETECTED Final   Coronavirus OC43 NOT DETECTED NOT DETECTED Final   Metapneumovirus NOT DETECTED NOT DETECTED Final   Rhinovirus / Enterovirus NOT DETECTED NOT DETECTED Final   Influenza A NOT DETECTED NOT DETECTED Final   Influenza B NOT DETECTED NOT DETECTED Final   Parainfluenza Virus 1 NOT DETECTED NOT DETECTED Final   Parainfluenza Virus 2 NOT DETECTED NOT DETECTED Final   Parainfluenza Virus 3 NOT DETECTED NOT DETECTED Final   Parainfluenza Virus 4 NOT DETECTED NOT DETECTED Final   Respiratory Syncytial Virus NOT DETECTED NOT DETECTED Final   Bordetella pertussis NOT DETECTED NOT DETECTED Final   Bordetella Parapertussis NOT DETECTED NOT DETECTED Final   Chlamydophila pneumoniae NOT DETECTED NOT DETECTED Final   Mycoplasma pneumoniae NOT DETECTED NOT DETECTED Final    Comment: Performed at [facility], [address] [place], [place] [address]  Surgical pcr screen     Status: None   Collection  Time: 08/07/24  1:58 AM   Specimen: Nasal Mucosa; Nasal Swab  Result Value Ref Range Status   MRSA, PCR NEGATIVE NEGATIVE Final   Staphylococcus aureus NEGATIVE NEGATIVE Final    Comment: (NOTE) The Xpert SA Assay (FDA approved for NASAL specimens in patients 69 years of age and older), is one component of a comprehensive surveillance program. It is not intended to diagnose infection nor to guide or monitor treatment. Performed at [facility], [address], [place], [place] [address]   Expectorated Sputum Assessment w Gram Stain, Rflx to Resp Cult     Status: None   Collection Time: 08/09/24  7:44 AM   Specimen: Expectorated Sputum  Result Value Ref Range Status   Specimen Description EXPECTORATED SPUTUM  Final   Special Requests NONE  Final   Sputum evaluation   Final    THIS SPECIMEN IS ACCEPTABLE FOR  SPUTUM CULTURE Performed at [facility], [address], [place], [place] [address]    Report Status 08/09/2024 FINAL  Final  Culture, Respiratory w Gram Stain     Status: None (Preliminary result)   Collection Time: 08/09/24  7:44 AM  Result Value Ref Range Status   Specimen Description EXPECTORATED SPUTUM  Final   Special Requests NONE Reflexed from [id number]  Final   Gram Stain   Final    FEW WBC PRESENT, PREDOMINANTLY PMN NO ORGANISMS SEEN    Culture   Final    FEW KLEBSIELLA OXYTOCA SUSCEPTIBILITIES TO FOLLOW Performed at [facility], [address], [place], [place] [address]    Report Status PENDING  Incomplete         Radiology Studies: No results found.       Scheduled Meds:  amiodarone   200 mg Oral BID   arformoterol   15 mcg Nebulization BID   aspirin  EC  81 mg Oral Q0600   atorvastatin   80 mg Oral Daily   Chlorhexidine  Gluconate Cloth  6 each Topical Daily   feeding supplement  237 mL Oral BID BM   furosemide   20 mg Oral Daily   insulin  aspart  0-6 Units Subcutaneous TID WC   ipratropium-albuterol   3 mL Nebulization BID   levothyroxine   37.5 mcg Oral Q0600   mirtazapine   7.5 mg Oral QHS   mouth rinse  15 mL Mouth Rinse 4 times per day   ranolazine   1,000 mg Oral BID   revefenacin   175 mcg Nebulization Daily   [START ON 08/12/2024] sacubitril -valsartan   1 tablet Oral BID   sodium chloride  flush  10-40 mL Intracatheter Q12H   sodium chloride  flush  3 mL Intravenous Q12H   spironolactone   25 mg Oral Daily   valACYclovir   1,000 mg Oral Daily   Continuous Infusions:  heparin  1,000 Units/hr (08/11/24 1247)     LOS: 18 days    Time spent: 45 minutes spent on chart review, discussion with nursing staff, consultants, updating family and interview/physical exam; more than 50% of that time was spent in counseling and/or coordination of care.    Pam RAYMOND Bowl, DO [facility] Available via Epic secure chat 7am-7pm After these hours,  please refer to coverage provider listed on [email or web address] 08/11/2024, 1:56 PM   "

## 2024-08-11 NOTE — Progress Notes (Signed)
 PHARMACY - ANTICOAGULATION  Pharmacy Consult for heparin  Indication: DVT  Brief A/P: Heparin  level subtherapeutic Increase Heparin  rate  Allergies[1]  Patient Measurements: Height: 5' 3 (160 cm) Weight: 62.8 kg (138 lb 7.2 oz) IBW/kg (Calculated) : 52.4 HEPARIN  DW (KG): 54.3  Vital Signs: Temp: 97.7 F (36.5 C) (01/10 2325) Temp Source: Oral (01/10 2325) BP: 134/52 (01/10 2325) Pulse Rate: 78 (01/10 2325)  Labs: Recent Labs    08/08/24 0543 08/09/24 0520 08/10/24 0615 08/10/24 1455 08/11/24 0015  HGB 8.2* 8.5* 8.2*  --   --   HCT 23.9* 25.1* 24.1*  --   --   PLT 333 340 335  --   --   HEPARINUNFRC  --   --   --  <0.10* 0.28*  CREATININE 1.02* 0.96 1.16*  --   --     Estimated Creatinine Clearance: 35.7 mL/min (A) (by C-G formula based on SCr of 1.16 mg/dL (H)).   Assessment: 74 yo female s/p R axillary bifemoral bypass 07/29/2024, now with LUE DVT, for heparin   Goal of Therapy:  Heparin  level 0.3-0.5 units/ml (due to recent hemoptysis)  Monitor platelets by anticoagulation protocol: Yes   Plan:  Increase Heparin  1000 units/hr Follow-up am labs.  Cathlyn Arrant, PharmD, BCPS      [1]  Allergies Allergen Reactions   Lisinopril  Cough    Dry cough

## 2024-08-11 NOTE — Progress Notes (Signed)
 PHARMACY ANTIBIOTIC CONSULT NOTE   Pam [name] a 74 y.o. female admitted with HF exacerbation and possible PNA. Pt recently completed course of Zosyn  and repeat cx 1/9 growing Klebsiella oxytoca with Zosyn  resistance. Given lack of clinical improvement and continued high O2 requirement, decision made to start cefepime  in sonsultation with overnight [facility] MD.   Pharmacy has been consulted for Cefepime  dosing.  1/11: Scr 1.40 (trend up), WBC 15.5 (trend up)   Estimated Creatinine Clearance: 29.6 mL/min (A) (by C-G formula based on SCr of 1.4 mg/dL (H)).  Plan: START Cefepime  2g IV Q24H Monitor renal function, O2 requirement, F/U DOT   Allergies:  Allergies[1]  Filed Weights   08/09/24 0351 08/10/24 0415 08/11/24 0123  Weight: 62.1 kg (136 lb 14.5 oz) 62.8 kg (138 lb 7.2 oz) 61.5 kg (135 lb 9.3 oz)       Latest Ref Rng & Units 08/11/2024    5:00 AM 08/10/2024    6:15 AM 08/09/2024    5:20 AM  CBC  WBC 4.0 - 10.5 K/uL 15.5  14.4  10.9   Hemoglobin 12.0 - 15.0 g/dL 8.6  8.2  8.5   Hematocrit 36.0 - 46.0 % 25.2  24.1  25.1   Platelets 150 - 400 K/uL 392  335  340     Antibiotics Given (last 72 hours)     Date/Time Action Medication Dose Rate   08/08/24 2127 New Bag/Given   piperacillin -tazobactam (ZOSYN ) IVPB 3.375 g 3.375 g 12.5 mL/hr   08/09/24 0504 New Bag/Given   piperacillin -tazobactam (ZOSYN ) IVPB 3.375 g 3.375 g 12.5 mL/hr   08/09/24 1028 Given   valACYclovir  (VALTREX ) tablet 1,000 mg 1,000 mg    08/09/24 1536 New Bag/Given   piperacillin -tazobactam (ZOSYN ) IVPB 3.375 g 3.375 g 12.5 mL/hr   08/09/24 2059 New Bag/Given   piperacillin -tazobactam (ZOSYN ) IVPB 3.375 g 3.375 g 12.5 mL/hr   08/10/24 9176 Given   valACYclovir  (VALTREX ) tablet 1,000 mg 1,000 mg    08/10/24 0827 New Bag/Given   piperacillin -tazobactam (ZOSYN ) IVPB 3.375 g 3.375 g 12.5 mL/hr   08/10/24 1359 New Bag/Given   piperacillin -tazobactam (ZOSYN ) IVPB 3.375 g 3.375 g 12.5 mL/hr   08/10/24 2318 New  Bag/Given   piperacillin -tazobactam (ZOSYN ) IVPB 3.375 g 3.375 g 12.5 mL/hr   08/11/24 9396 New Bag/Given   piperacillin -tazobactam (ZOSYN ) IVPB 3.375 g 3.375 g 12.5 mL/hr   08/11/24 1025 Given   valACYclovir  (VALTREX ) tablet 1,000 mg 1,000 mg        Antimicrobials this admission: Zosyn  1/4 >> 1/11 CFP 1/11>>  Microbiology results: 1/9 sputum kleb oxytoca-(R Zosyn ) 1/6 flu neg  1/4 Sputum--PSA (R imipenem) 1/2 MRSA PCR: negative 1/2 flu neg  Thank you for allowing pharmacy to be a part of this patients care.  Massie Fila, PharmD Clinical Pharmacist  08/11/2024 8:13 PM        [1]  Allergies Allergen Reactions   Lisinopril  Cough    Dry cough

## 2024-08-11 NOTE — Progress Notes (Signed)
 PHARMACY - ANTICOAGULATION CONSULT NOTE  Pharmacy Consult for heparin  Indication: DVT in left axillary vein   Allergies[1]  Patient Measurements: Height: 5' 3 (160 cm) Weight: 61.5 kg (135 lb 9.3 oz) IBW/kg (Calculated) : 52.4 HEPARIN  DW (KG): 54.3  Vital Signs: Temp: 98.6 F (37 C) (01/11 0400) Temp Source: Oral (01/11 0400) BP: 106/94 (01/11 0400) Pulse Rate: 71 (01/11 0400)  Labs: Recent Labs    08/09/24 0520 08/10/24 0615 08/10/24 1455 08/11/24 0015 08/11/24 0500  HGB 8.5* 8.2*  --   --  8.6*  HCT 25.1* 24.1*  --   --  25.2*  PLT 340 335  --   --  392  HEPARINUNFRC  --   --  <0.10* 0.28* 0.35  CREATININE 0.96 1.16*  --   --  1.40*    Estimated Creatinine Clearance: 29.6 mL/min (A) (by C-G formula based on SCr of 1.4 mg/dL (H)).   Medical History: Past Medical History:  Diagnosis Date   Abnormal EKG    AICD (automatic cardioverter/defibrillator) present    [employer]tronic   Alcoholic intoxication without complication    Allergy    Cardiomyopathy, ischemic    Cataract    Chest pain 08/29/2016   Chest pain 08/29/2016   CHF (congestive heart failure) (HCC)    Chronic systolic heart failure (HCC) 08/29/2016   Coronary artery disease    Facial laceration    Fall 07/07/2016   Hyperlipidemia    Hypertension 03/03/2017   Hyponatremia    Hypothyroidism 07/08/2016   NSTEMI (non-ST elevated myocardial infarction) [facility])    Syncope    Thyroid  disease    Assessment: 74 yo female s/p R axillary bifemoral bypass 07/29/2024 with admission complicated by fluid overload, hypotension, and respiratory insufficiency. UE dulplex (1/9) positive for DVT in the left axillary vein and positive for an SVT in the cephalic vein. Anticoagulation was initially on hold due to hemoptysis, which has improved and now resolved.   1/11 AM update: HL 0.35 Hgb 8.6 PLT wnl No signs of bleeding / pauses w/ gtt  Goal of Therapy:  Heparin  level 0.3-0.5 units/ml (due to recent hemoptysis)   Monitor platelets by anticoagulation protocol: Yes   Plan:  Continue heparin  infusion at 100 units/hr Check confirmatory anti-Xa level in 8 hours and daily while on heparin  Continue to monitor H&H and platelets, and signs/symptoms of bleed   Adri Schloss BS, PharmD, BCPS Clinical Pharmacist 08/11/2024 6:56 AM  Contact: [phone] after 3 PM    [1]  Allergies Allergen Reactions   Lisinopril  Cough    Dry cough

## 2024-08-11 NOTE — Progress Notes (Signed)
 Informed patients blood pressure was low, checked on patients left leg and pressure was 87/50 with MAP of 61.  NO c/o of any side effects from the hypotension.  Dr Harlene Bowl notified. Orders received

## 2024-08-11 NOTE — Progress Notes (Signed)
" °  Progress Note    08/11/2024 8:01 AM 4 Days Post-Op  Subjective: Feels like she is getting better, stated she got up and walked yesterday with the help of PT and nursing   Vitals:   08/10/24 2325 08/11/24 0400  BP: (!) 134/52 (!) 106/94  Pulse: 78 71  Resp: 20 16  Temp: 97.7 F (36.5 C) 98.6 F (37 C)  SpO2: 96% 99%   Physical Exam: Cardiac:  regular Lungs:  non labored, on HFNC Incisions:   Continues to have anasarca right infraclavicular incision is clean, dry and intact. Left groin incision healing well. right groin with Prevena VAC-serous output. Extremities:  BLE with doppler DP and PT signals, TED hose in place Abdomen:  soft Neurologic: alert and oriented  CBC    Component Value Date/Time   WBC 15.5 (H) 08/11/2024 0500   RBC 2.74 (L) 08/11/2024 0500   HGB 8.6 (L) 08/11/2024 0500   HGB 13.6 06/11/2024 1039   HCT 25.2 (L) 08/11/2024 0500   HCT 39.0 06/11/2024 1039   PLT 392 08/11/2024 0500   PLT 335 06/11/2024 1039   MCV 92.0 08/11/2024 0500   MCV 102 (H) 06/11/2024 1039   MCH 31.4 08/11/2024 0500   MCHC 34.1 08/11/2024 0500   RDW 15.9 (H) 08/11/2024 0500   RDW 13.1 06/11/2024 1039   LYMPHSABS 0.5 (L) 08/06/2024 1143   LYMPHSABS 2.9 08/25/2016 1648   MONOABS 1.0 08/06/2024 1143   EOSABS 0.0 08/06/2024 1143   EOSABS 0.2 08/25/2016 1648   BASOSABS 0.0 08/06/2024 1143   BASOSABS 0.0 08/25/2016 1648    BMET    Component Value Date/Time   NA 128 (L) 08/11/2024 0500   NA 132 (L) 06/11/2024 1039   K 3.9 08/11/2024 0500   CL 89 (L) 08/11/2024 0500   CO2 31 08/11/2024 0500   GLUCOSE 101 (H) 08/11/2024 0500   BUN 34 (H) 08/11/2024 0500   BUN 15 06/11/2024 1039   CREATININE 1.40 (H) 08/11/2024 0500   CREATININE 1.13 (H) 05/30/2024 0851   CALCIUM  9.6 08/11/2024 0500   GFRNONAA 40 (L) 08/11/2024 0500   GFRAA 78 08/19/2019 0830    INR    Component Value Date/Time   INR 1.1 08/06/2024 1640     Intake/Output Summary (Last 24 hours) at 08/11/2024  0801 Last data filed at 08/11/2024 0340 Gross per 24 hour  Intake 1513.78 ml  Output 300 ml  Net 1213.78 ml     Assessment/Plan:  74 y.o. female is s/p R axillary bifemoral bypass 07/29/2024 by Dr. Lanis for BLE CLI with rest pain.  Admission has been complicated by fluid overload and acute respiratory failure with acute on chronic CHF  While the patient feels like she is doing better, I see no changes. Continues to have anasarca, protein malnutrition, pulmonary insufficiency requiring significant O2 supplementation From a wound standpoint, left groin dry, right groin with VAC, with continued serous output which is expected in her current state due to the anasarca.  Will continue to manage with the VAC.  Will move to regular VAC machine tomorrow, and leave the current vacuum dressing in place until Wednesday.  Patient remains critical, I am very concerned that she will not survive this admission and she has had no significant improvement over the last 2 weeks.  Continue to trend WBC-I do not have an etiology at this time other than bilateral pleural effusions, however the patient remains on antibiotics.      "

## 2024-08-11 NOTE — Plan of Care (Signed)
" °  Problem: Education: Goal: Knowledge of General Education information will improve Description: Including pain rating scale, medication(s)/side effects and non-pharmacologic comfort measures Outcome: Progressing   Problem: Clinical Measurements: Goal: Will remain free from infection Outcome: Progressing Goal: Diagnostic test results will improve Outcome: Progressing Goal: Respiratory complications will improve Outcome: Progressing   Problem: Activity: Goal: Risk for activity intolerance will decrease Outcome: Progressing   Problem: Nutrition: Goal: Adequate nutrition will be maintained Outcome: Progressing   Problem: Safety: Goal: Ability to remain free from injury will improve Outcome: Progressing   Problem: Education: Goal: Knowledge of the prescribed therapeutic regimen will improve Outcome: Progressing   Problem: Respiratory: Goal: Respiratory status will improve Outcome: Progressing   "

## 2024-08-11 NOTE — Progress Notes (Signed)
 PHARMACY - ANTICOAGULATION CONSULT NOTE  Pharmacy Consult for heparin  Indication: DVT in left axillary vein   Allergies[1]  Patient Measurements: Height: 5' 3 (160 cm) Weight: 61.5 kg (135 lb 9.3 oz) IBW/kg (Calculated) : 52.4 HEPARIN  DW (KG): 54.3  Vital Signs: Temp: 98 F (36.7 C) (01/11 1210) Temp Source: Oral (01/11 1210) BP: 91/45 (01/11 1210) Pulse Rate: 69 (01/11 1210)  Labs: Recent Labs    08/09/24 0520 08/10/24 0615 08/10/24 1455 08/11/24 0015 08/11/24 0500 08/11/24 1300  HGB 8.5* 8.2*  --   --  8.6*  --   HCT 25.1* 24.1*  --   --  25.2*  --   PLT 340 335  --   --  392  --   HEPARINUNFRC  --   --    < > 0.28* 0.35 0.27*  CREATININE 0.96 1.16*  --   --  1.40*  --    < > = values in this interval not displayed.    Estimated Creatinine Clearance: 29.6 mL/min (A) (by C-G formula based on SCr of 1.4 mg/dL (H)).   Medical History: Past Medical History:  Diagnosis Date   Abnormal EKG    AICD (automatic cardioverter/defibrillator) present    Medtronic   Alcoholic intoxication without complication    Allergy    Cardiomyopathy, ischemic    Cataract    Chest pain 08/29/2016   Chest pain 08/29/2016   CHF (congestive heart failure) (HCC)    Chronic systolic heart failure (HCC) 08/29/2016   Coronary artery disease    Facial laceration    Fall 07/07/2016   Hyperlipidemia    Hypertension 03/03/2017   Hyponatremia    Hypothyroidism 07/08/2016   NSTEMI (non-ST elevated myocardial infarction) [facility])    Syncope    Thyroid  disease    Assessment: 74 yo female s/p R axillary bifemoral bypass 07/29/2024 with admission complicated by fluid overload, hypotension, and respiratory insufficiency. UE dulplex (1/9) positive for DVT in the left axillary vein and positive for an SVT in the cephalic vein. Anticoagulation was initially on hold due to hemoptysis, which has improved and now resolved.   1/11 AM update: Heparin  level is slightly subtherapeutic at 0.27, Hgb 8.6,  PLT wnl, no signs of bleeding / pauses w/ gtt (per RN)  Goal of Therapy:  Heparin  level 0.3-0.5 units/ml (due to recent hemoptysis)  Monitor platelets by anticoagulation protocol: Yes   Plan:  Increase heparin  infusion to 1050 units/hr Check heparin  level in 8 hours and daily while on heparin  Continue to monitor H&H and platelets, and signs/symptoms of bleed  Feliciano Close, PharmD PGY2 Infectious Diseases Pharmacy Resident      [1]  Allergies Allergen Reactions   Lisinopril  Cough    Dry cough

## 2024-08-11 NOTE — Plan of Care (Signed)
   Problem: Education: Goal: Knowledge of General Education information will improve Description Including pain rating scale, medication(s)/side effects and non-pharmacologic comfort measures Outcome: Progressing

## 2024-08-11 NOTE — Progress Notes (Signed)
 Wound vac cartridge change..125 cc was placed in the biohazardous bin.

## 2024-08-12 DIAGNOSIS — I5023 Acute on chronic systolic (congestive) heart failure: Secondary | ICD-10-CM | POA: Diagnosis not present

## 2024-08-12 DIAGNOSIS — J9601 Acute respiratory failure with hypoxia: Secondary | ICD-10-CM | POA: Diagnosis not present

## 2024-08-12 DIAGNOSIS — I5020 Unspecified systolic (congestive) heart failure: Secondary | ICD-10-CM | POA: Diagnosis not present

## 2024-08-12 DIAGNOSIS — J150 Pneumonia due to Klebsiella pneumoniae: Secondary | ICD-10-CM

## 2024-08-12 LAB — GLUCOSE, CAPILLARY
Glucose-Capillary: 98 mg/dL (ref 70–99)
Glucose-Capillary: 109 mg/dL — ABNORMAL HIGH (ref 70–99)
Glucose-Capillary: 120 mg/dL — ABNORMAL HIGH (ref 70–99)
Glucose-Capillary: 124 mg/dL — ABNORMAL HIGH (ref 70–99)

## 2024-08-12 LAB — CBC
HCT: 23.3 % — ABNORMAL LOW (ref 36.0–46.0)
Hemoglobin: 7.7 g/dL — ABNORMAL LOW (ref 12.0–15.0)
MCH: 31 pg (ref 26.0–34.0)
MCHC: 33 g/dL (ref 30.0–36.0)
MCV: 94 fL (ref 80.0–100.0)
Platelets: 365 K/uL (ref 150–400)
RBC: 2.48 MIL/uL — ABNORMAL LOW (ref 3.87–5.11)
RDW: 17.6 % — ABNORMAL HIGH (ref 11.5–15.5)
WBC: 18 K/uL — ABNORMAL HIGH (ref 4.0–10.5)
nRBC: 0 % (ref 0.0–0.2)

## 2024-08-12 LAB — BASIC METABOLIC PANEL WITH GFR
Anion gap: 7 (ref 5–15)
BUN: 35 mg/dL — ABNORMAL HIGH (ref 8–23)
CO2: 32 mmol/L (ref 22–32)
Calcium: 9.3 mg/dL (ref 8.9–10.3)
Chloride: 89 mmol/L — ABNORMAL LOW (ref 98–111)
Creatinine, Ser: 1.35 mg/dL — ABNORMAL HIGH (ref 0.44–1.00)
GFR, Estimated: 41 mL/min — ABNORMAL LOW
Glucose, Bld: 109 mg/dL — ABNORMAL HIGH (ref 70–99)
Potassium: 4.3 mmol/L (ref 3.5–5.1)
Sodium: 128 mmol/L — ABNORMAL LOW (ref 135–145)

## 2024-08-12 LAB — HEPARIN LEVEL (UNFRACTIONATED): Heparin Unfractionated: 0.27 [IU]/mL — ABNORMAL LOW (ref 0.30–0.70)

## 2024-08-12 LAB — MAGNESIUM: Magnesium: 1.9 mg/dL (ref 1.7–2.4)

## 2024-08-12 LAB — LACTIC ACID, PLASMA: Lactic Acid, Venous: 1.2 mmol/L (ref 0.5–1.9)

## 2024-08-12 MED ORDER — COLLAGENASE 250 UNIT/GM EX OINT
TOPICAL_OINTMENT | Freq: Every day | CUTANEOUS | Status: DC
  Filled 2024-08-12: qty 30

## 2024-08-12 MED ORDER — FUROSEMIDE 10 MG/ML IJ SOLN
60.0000 mg | Freq: Two times a day (BID) | INTRAMUSCULAR | Status: AC
  Administered 2024-08-12 (×2): 60 mg via INTRAVENOUS
  Filled 2024-08-12 (×2): qty 6

## 2024-08-12 MED ORDER — MIDODRINE HCL 5 MG PO TABS
5.0000 mg | ORAL_TABLET | Freq: Three times a day (TID) | ORAL | Status: DC | PRN
  Administered 2024-08-12: 5 mg via ORAL
  Filled 2024-08-12 (×2): qty 1

## 2024-08-12 MED ORDER — ALBUMIN HUMAN 25 % IV SOLN
25.0000 g | Freq: Once | INTRAVENOUS | Status: AC
  Administered 2024-08-12: 25 g via INTRAVENOUS
  Filled 2024-08-12: qty 100

## 2024-08-12 MED ORDER — SODIUM CHLORIDE 0.9 % IV SOLN
2.0000 g | Freq: Two times a day (BID) | INTRAVENOUS | Status: DC
  Administered 2024-08-12 – 2024-08-13 (×3): 2 g via INTRAVENOUS
  Filled 2024-08-12 (×3): qty 12.5

## 2024-08-12 NOTE — Progress Notes (Signed)
 BP is 79/39. Patient asymptomatic. Plan to give albumin  and check labs.

## 2024-08-12 NOTE — Progress Notes (Signed)
 Notified md regarding bp 79/39 (50). Pt asymptomatic, will continue to monitor.

## 2024-08-12 NOTE — Progress Notes (Signed)
 Patient ID: Pam [name], female   DOB: 01-04-1951, 74 y.o.   MRN: [id number]     Advanced Heart Failure Rounding Note  Cardiologist: Lonni Cash, MD  [facility] Cardiologist: Establishing Dr. Rolan  Patient Profile   Pam [name] is a 74 y.o. female with  history of chronic HFrEF/mixed cardiomyopathy, CAD, PAD, chronic hyponatremia, COPD.    Admitted with acute respiratory failure, acute on chronic CHF and hyponatremia.  Significant Events:    1/5: started on Milrinone  for low co-ox, 53% 1/5: moved to CCU for acute hypoxic respiratory failure requiring Bipap, ?seizure like activity. CXR w/ pulm edema. Runs of VT treated w/ ICD shocks, milrinone  d/ced  (K 3.2, Mg 1.6) 1/7: RHC showed normal filling pressures, mild pulmonary hypertension, normal CO, good PAPi 1/8: CTA chest with no PE, airspace and interstitial infiltrates throughout lungs, large bilateral effusions, emphysema.  1/9: RUE US : DVT noted.    Subjective:    Lactate 1.2.  BP low overnight, received albumin .  101/49 today.   Klebsiella in sputum, now on cefepime .  WBCs 18.   No further VT   Hgb 8.6 => 7.7, no overt bleeding.  Heparin  gtt for LUE DVT.    Objective:   Weight Range: 61.1 kg Body mass index is 23.84 kg/m.   Vital Signs:   Temp:  [97.7 F (36.5 C)-98.7 F (37.1 C)] 97.7 F (36.5 C) (01/12 0349) Pulse Rate:  [64-72] 68 (01/12 0349) Resp:  [14-18] 14 (01/12 0349) BP: (79-101)/(39-49) 101/49 (01/12 0349) SpO2:  [96 %-100 %] 96 % (01/12 0349) Weight:  [61.1 kg] 61.1 kg (01/12 0500) Last BM Date : 08/11/24  Weight change: Filed Weights   08/10/24 0415 08/11/24 0123 08/12/24 0500  Weight: 62.8 kg 61.5 kg 61.1 kg    Intake/Output:   Intake/Output Summary (Last 24 hours) at 08/12/2024 0736 Last data filed at 08/12/2024 0300 Gross per 24 hour  Intake 691.19 ml  Output 250 ml  Net 441.19 ml     Physical Exam   General: NAD, frail Neck: JVP 10 cm, no thyromegaly or thyroid  nodule.   Lungs: Distant BS CV: Nondisplaced PMI.  Heart regular S1/S2, no S3/S4, no murmur.  1+ edema right arm.  Abdomen: Soft, nontender, no hepatosplenomegaly, no distention.  Skin: Intact without lesions or rashes.  Neurologic: Alert and oriented x 3.  Psych: Normal affect. Extremities: RLE wound vac HEENT: Normal.   Telemetry   NSR, personally reviewed   Labs   CBC Recent Labs    08/11/24 0500 08/12/24 0229  WBC 15.5* 18.0*  HGB 8.6* 7.7*  HCT 25.2* 23.3*  MCV 92.0 94.0  PLT 392 365   Basic Metabolic Panel Recent Labs    98/88/73 0500 08/12/24 0229  NA 128* 128*  K 3.9 4.3  CL 89* 89*  CO2 31 32  GLUCOSE 101* 109*  BUN 34* 35*  CREATININE 1.40* 1.35*  CALCIUM  9.6 9.3  MG 1.9 1.9   Liver Function Tests No results for input(s): AST, ALT, ALKPHOS, BILITOT, PROT, ALBUMIN  in the last 72 hours.  No results for input(s): LIPASE, AMYLASE in the last 72 hours. Cardiac Enzymes No results for input(s): CKTOTAL, CKMB, CKMBINDEX, TROPONINI in the last 72 hours.  BNP: BNP (last 3 results) Recent Labs    11/22/23 1129 03/26/24 1423 05/29/24 1206  BNP 472.0* 614.1* 841.1*    ProBNP (last 3 results) Recent Labs    07/24/24 1016 08/02/24 1551 08/09/24 1626  PROBNP 4,537.0* 5,039.0* 8,218.0*     D-Dimer  No results for input(s): DDIMER in the last 72 hours. Hemoglobin A1C No results for input(s): HGBA1C in the last 72 hours. Fasting Lipid Panel No results for input(s): CHOL, HDL, LDLCALC, TRIG, CHOLHDL, LDLDIRECT in the last 72 hours. Medications:   Scheduled Medications:  amiodarone   200 mg Oral BID   arformoterol   15 mcg Nebulization BID   aspirin  EC  81 mg Oral Q0600   atorvastatin   80 mg Oral Daily   Chlorhexidine  Gluconate Cloth  6 each Topical Daily   feeding supplement  237 mL Oral BID BM   furosemide   60 mg Intravenous BID   insulin  aspart  0-6 Units Subcutaneous TID WC   ipratropium-albuterol   3 mL  Nebulization BID   levothyroxine   37.5 mcg Oral Q0600   mirtazapine   7.5 mg Oral QHS   mouth rinse  15 mL Mouth Rinse 4 times per day   ranolazine   1,000 mg Oral BID   revefenacin   175 mcg Nebulization Daily   sodium chloride  flush  10-40 mL Intracatheter Q12H   sodium chloride  flush  3 mL Intravenous Q12H   spironolactone   25 mg Oral Daily   valACYclovir   1,000 mg Oral Daily    Infusions:  ceFEPime  (MAXIPIME ) IV 2 g (08/11/24 2326)   heparin  1,050 Units/hr (08/11/24 1516)    PRN Medications: acetaminophen , albuterol , calcium  carbonate, guaiFENesin -dextromethorphan, HYDROmorphone  (DILAUDID ) injection, iohexol , ondansetron  (ZOFRAN ) IV, mouth rinse, oxyCODONE -acetaminophen , phenol, polyethylene glycol, senna, sodium chloride  flush, sodium chloride  flush  Assessment/Plan   1. Acute hypoxemic respiratory failure: Multifactorial.  Patient is a prior smoker with h/o COPD but has not been on home oxygen. Admit CXR with bilateral lower lobe infiltrates, but procalcitonin has not been elevated.  Respiratory virus panel negative.  Concern for COPD exacerbation.  Volume overload also thought to contribute.  CTA chest on 1/8 with no PE, airspace and interstitial infiltrates throughout lungs, large bilateral effusions, emphysema.  She was on Solumedrol earlier in hospitalization, now off.  She remains on 5L [place].  Klebsiella PNA on sputum culture.  CVP mildly elevated at 10 today.  - Lasix  60 mg IV bid today.   - Cefepime  for Klebsiella.  2. Acute on chronic systolic CHF: Ischemic cardiomyopathy, has MDT ICD.  Echo this admission with EF 30-35%, WMAs, normal RV, IVC not dilated.  CVP 10 today.  BP low overnight, possible septic physiology with Klebsiella PNA.  - Lasix  60 mg IV bid and follow response.  - Hold Entresto  for now.  - Spironolactone  25 mg daily.  - No SLGT2 inhibitor with h/o urinary incontinence.  3. CAD: NSTEMI 12/17, CTO LAD with collaterals and severe pLCx/OM1 stenosis on cath.  She  had PCI to pLCx and OM1.  No chest pain.  - Continue ASA 81 - Continue statin.  - Continue ranolazine  4. Hyponatremia: This appears chronic, Na in upper 120s -130 range generally, has been seen by endocrinology in the past.  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  ?Baseline SIADH with additional component of hypervolemic hyponatremia this admission vs worsening SIADH due to COPD exacerbation/lung infection. Tolvaptan  given 1/6. Na 128 today.  - Fluid restrict.   - Continue diuresis.   5. PAD: S/p R sided axillobifemoral bypass using PTFE and R CFA endarterectomy this admission. Has wound vac present still.  - ASA - Statin - VVS following.  6. Post-op anemia/ IDA: Hgb 7.7 today, no overt bleeding.  With volume overload, would transfuse hgb < 7.  7. VT: Multiple runs with ICD shocks in setting  of low K and Mg and milrinone  initiation.  Now off milrinone . No further VT. K and Mg now replete  - po amiodarone  200 bid.  8. RUE DVT: Continue heparin  gtt.   Ezra Shuck 08/12/2024 7:47 AM

## 2024-08-12 NOTE — Progress Notes (Signed)
 PT Cancellation Note  Patient Details Name: Pam [name] MRN: [id number] DOB: 11/09/1950   Cancelled Treatment:    Reason Eval/Treat Not Completed: Fatigue/lethargy limiting ability to participate (Pt reports she cannot participate in PT treatment d/t fatigue. She declined to transfer to recliner chair. BP 104/49 (64). Will follow-up as schedule permits.)  Randall SAUNDERS, PT, DPT Acute Rehabilitation Services Office: [phone] Secure Chat Preferred  [name] 08/12/2024, 1:27 PM

## 2024-08-12 NOTE — Progress Notes (Signed)
 PT Cancellation Note  Patient Details Name: Pam [name] MRN: [id number] DOB: [birth year]   Cancelled Treatment:    Reason Eval/Treat Not Completed: Fatigue/lethargy limiting ability to participate;Medical issues which prohibited therapy (Per MD and RN, pt has been hypotensive and is very fatigued. Requested PT hold this morning and check back later this afternoon for treatment with BP MAP goal >65.)  Randall SAUNDERS, PT, DPT Acute Rehabilitation Services Office: [phone] Secure Chat Preferred  [name] 08/12/2024, 9:40 AM

## 2024-08-12 NOTE — Consult Note (Signed)
 WOC Nurse wound follow up Wound type: Stage 3 Pressure Injury Measurement: 6cm x 7.5cm x 0.1cm  Wound bed:75% yellow fibrinous/ 25% pale pink Drainage (amount, consistency, odor) serosanguinous  Periwound: intact  Dressing procedure/placement/frequency: DC Aquacel Begin Santyl  1/4 layer to wound, top with saline moist gauze and dry dressing.   Turn and reposition per hospital policy, however patient prefers to be supine with HOB up >30 degrees which is contraindicated for offloading the sacrum. But the patient is SOB otherwise   WOC Nurse team will follow with you and see patient within 10 days for wound assessments.  Please notify WOC nurses of any acute changes in the wounds or any new areas of concern Jeanett Antonopoulos [facility] MSN, RN,CWOCN, CNS, CWON-AP [phone]

## 2024-08-12 NOTE — Progress Notes (Signed)
 PHARMACY - ANTICOAGULATION CONSULT NOTE  Pharmacy Consult for heparin  Indication: DVT in left axillary vein   Allergies[1]  Patient Measurements: Height: 5' 3 (160 cm) Weight: 61.1 kg (134 lb 9.6 oz) IBW/kg (Calculated) : 52.4 HEPARIN  DW (KG): 54.3  Vital Signs: Temp: 97.7 F (36.5 C) (01/12 0349) Temp Source: Oral (01/12 0349) BP: 101/49 (01/12 0349) Pulse Rate: 68 (01/12 0349)  Labs: Recent Labs    08/10/24 0615 08/10/24 1455 08/11/24 0500 08/11/24 1300 08/12/24 0048 08/12/24 0229  HGB 8.2*  --  8.6*  --   --  7.7*  HCT 24.1*  --  25.2*  --   --  23.3*  PLT 335  --  392  --   --  365  HEPARINUNFRC  --    < > 0.35 0.27* 0.27*  --   CREATININE 1.16*  --  1.40*  --   --  1.35*   < > = values in this interval not displayed.    Estimated Creatinine Clearance: 30.7 mL/min (A) (by C-G formula based on SCr of 1.35 mg/dL (H)).   Medical History: Past Medical History:  Diagnosis Date   Abnormal EKG    AICD (automatic cardioverter/defibrillator) present    [employer]   Alcoholic intoxication without complication    Allergy    Cardiomyopathy, ischemic    Cataract    Chest pain 08/29/2016   Chest pain 08/29/2016   CHF (congestive heart failure) (HCC)    Chronic systolic heart failure (HCC) 08/29/2016   Coronary artery disease    Facial laceration    Fall 07/07/2016   Hyperlipidemia    Hypertension 03/03/2017   Hyponatremia    Hypothyroidism 07/08/2016   NSTEMI (non-ST elevated myocardial infarction) [facility])    Syncope    Thyroid  disease    Assessment: 74 yo female s/p R axillary bifemoral bypass 07/29/2024 with admission complicated by fluid overload, hypotension, and respiratory insufficiency. UE dulplex (1/9) positive for DVT in the left axillary vein and positive for an SVT in the cephalic vein. Anticoagulation was initially on hold due to hemoptysis, which has improved and now resolved.   Heparin  drip 1050 uts/hr with heparin  level 0.27 slightly less than  goal. No bleeding noted, hgb fell sligtly Hgb 8.6>7.7 in last 2 days PLT wnl.  Goal of Therapy:  Heparin  level 0.3-0.5 units/ml (due to recent hemoptysis)  Monitor platelets by anticoagulation protocol: Yes   Plan:  Increase heparin  infusion to 1100 units/hr Check heparin  level and CBC daily while on heparin  Continue to monitor H&H and platelets, and signs/symptoms of bleed    Olam Chalk Pharm.D. CPP, BCPS Clinical Pharmacist [phone] 08/12/2024 8:30 AM        [1]  Allergies Allergen Reactions   Lisinopril  Cough    Dry cough

## 2024-08-12 NOTE — Progress Notes (Signed)
 "  NAME:  Pam [name], MRN:  [id number], DOB:  1951-03-14, LOS: 19 ADMISSION DATE:  07/24/2024, CONSULTATION DATE:  08/12/2024 REFERRING MD:  TRH, CHIEF COMPLAINT:  hypoxemia   History of Present Illness:  74 year old female w/ h/o HFrEF, has ICD, HTN, PAD, HLD, came in initially on 12/24 w/ right sided leg pain and ischemia. Went to OR for right axillobifemoral bypass and fight common femoral endartectomy on 12/29.    Course has been c/b post op hypotension; placed on midodrine , volume overload w/ no sig response to IV diuresis. Hyponatremia (acute on chronic) baseline mid 120s, got tolvaptan  on 1/2.  ADV HF consulted on 1/2. PICC placed (CVP ~11) wt still up, diuretics adjusted. On 1/4 still reporting increased SOB. CXR raising concern for PNA so abx started. Na down to 124. 1/5 seen again by adv HF. Wheezing, steroids and BDs added. Milrinone  started. Lasix  up to 80mg /d, Spiro added. Na 128->125   On 1/5 evening hrs had 4 episodes of VT triggering ICD shocks. Administered additional amiodarone  bolus. Milrinone  stopped. K 3.2 , Mg 1.6. has chronic LAD. W/ mild ST elevation. During events there was change in mental status with concern for possible seizure however this was felt primarily to be syncopal event associated with the VT.   On 1/5 she was transferred to the intensive care for progressive respiratory failure and hemodynamic instability including recurrent VT  Pertinent  Medical History    Significant Hospital Events: Including procedures, antibiotic start and stop dates in addition to other pertinent events     Interim History / Subjective:  Remains hypoxemic.  But improved on flow rate.  After diuresis.  Appreciate heart failure assistance.  Repeat culture grew Pseudomonas now on cefepime .  Objective    Blood pressure (!) 90/48, pulse 73, temperature 98.2 F (36.8 C), temperature source Oral, resp. rate 20, height 5' 3 (1.6 m), weight 61.1 kg, SpO2 94%. CVP:  [11 mmHg-61 mmHg]  13 mmHg      Intake/Output Summary (Last 24 hours) at 08/12/2024 1631 Last data filed at 08/12/2024 1314 Gross per 24 hour  Intake 960 ml  Output 250 ml  Net 710 ml   Filed Weights   08/10/24 0415 08/11/24 0123 08/12/24 0500  Weight: 62.8 kg 61.5 kg 61.1 kg    Examination: General: Sitting up, receiving nebulizer treatment HENT: Atraumatic normocephalic Lungs: Clear anteriorly no wheeze Cardiovascular: Borderline tachycardic, no murmur, left upper extremity edema, minimal lower extremity edema in right upper extremity edema noted Abdomen: Nondistended  Resolved problem list   Assessment and Plan   Acute hypoxemic respiratory failure: Multifactorial but hospitalized for CHF exacerbation with most recent cross-sectional imaging 1/8 with bilateral pleural effusions and interlobular septal thickening ground glass opacities.  Most consistent with pulmonary edema and pulmonary venous hypertension, volume overload.  Worsening pneumonia with gram-negative organisms on 2 different lower respiratory cultures. -- Diuresis at the discretion of heart failure service -- Continue antibiotic as below -- Out of bed as much as possible, ambulate as able, incentive spirometry and flutter valve frequently, needs to mobilize with good pulmonary hygiene -- Oxygen saturation goal 88%, please wean oxygen as able  Emphysema: -- Continue nebulized therapies  [facility] pneumonia: -- Recommend 7 days of coverage, cefepime  IV while admitted, appears sensitive to fluoroquinolones which could be administered orally if ready for discharge from hospital prior to ending course  No further recommendations, PCCM will sign off.  Labs   CBC: Recent Labs  Lab 08/06/24 0501  08/06/24 0622 08/06/24 1143 08/06/24 1640 08/08/24 0543 08/09/24 0520 08/10/24 0615 08/11/24 0500 08/12/24 0229  WBC 9.1   < > 10.8*   < > 9.7 10.9* 14.4* 15.5* 18.0*  NEUTROABS 7.8*  --  9.3*  --   --   --   --   --   --    HGB 7.8*   < > 7.5*   < > 8.2* 8.5* 8.2* 8.6* 7.7*  HCT 22.2*   < > 21.7*   < > 23.9* 25.1* 24.1* 25.2* 23.3*  MCV 89.9   < > 91.2   < > 88.8 90.6 90.6 92.0 94.0  PLT 374   < > 389   < > 333 340 335 392 365   < > = values in this interval not displayed.    Basic Metabolic Panel: Recent Labs  Lab 08/06/24 0200 08/06/24 0500 08/08/24 0543 08/09/24 0520 08/10/24 0615 08/11/24 0500 08/12/24 0229  NA 122*   < > 130* 130* 129* 128* 128*  K 3.4*   < > 4.1 4.7 4.3 3.9 4.3  CL 84*   < > 90* 92* 90* 89* 89*  CO2 26   < > 31 29 32 31 32  GLUCOSE 191*   < > 102* 109* 102* 101* 109*  BUN 23   < > 32* 30* 34* 34* 35*  CREATININE 0.94   < > 1.02* 0.96 1.16* 1.40* 1.35*  CALCIUM  8.9   < > 9.3 9.4 9.5 9.6 9.3  MG 2.3   < > 2.1 2.0 1.9 1.9 1.9  PHOS 3.0  --   --   --   --   --   --    < > = values in this interval not displayed.   GFR: Estimated Creatinine Clearance: 30.7 mL/min (A) (by C-G formula based on SCr of 1.35 mg/dL (H)). Recent Labs  Lab 08/05/24 2103 08/06/24 0133 08/06/24 0343 08/06/24 0501 08/09/24 0520 08/10/24 0615 08/11/24 0500 08/12/24 0229 08/12/24 0230  WBC 10.3  --   --    < > 10.9* 14.4* 15.5* 18.0*  --   LATICACIDVEN 5.7* 2.1* 1.1  --   --   --   --   --  1.2   < > = values in this interval not displayed.    Liver Function Tests: Recent Labs  Lab 08/06/24 1143  AST 36  ALT 21  ALKPHOS 54  BILITOT 0.4  PROT 5.3*  ALBUMIN  3.2*   No results for input(s): LIPASE, AMYLASE in the last 168 hours. No results for input(s): AMMONIA in the last 168 hours.  ABG    Component Value Date/Time   PHART 7.430 08/05/2024 2242   PCO2ART 35.1 08/05/2024 2242   PO2ART 132 (H) 08/05/2024 2242   HCO3 30.1 (H) 08/07/2024 0915   TCO2 31 08/07/2024 0915   ACIDBASEDEF 1.0 08/05/2024 2242   O2SAT 46.1 08/08/2024 0525     Coagulation Profile: Recent Labs  Lab 08/06/24 1640  INR 1.1    Cardiac Enzymes: No results for input(s): CKTOTAL, CKMB,  CKMBINDEX, TROPONINI in the last 168 hours.  HbA1C: Hgb A1c MFr Bld  Date/Time Value Ref Range Status  05/30/2024 08:51 AM 5.4 <5.7 % Final    Comment:    For the purpose of screening for the presence of diabetes: . <5.7%       Consistent with the absence of diabetes 5.7-6.4%    Consistent with increased risk for diabetes             (  prediabetes) > or =6.5%  Consistent with diabetes . This assay result is consistent with a decreased risk of diabetes. . Currently, no consensus exists regarding use of hemoglobin A1c for diagnosis of diabetes in children. . According to American Diabetes Association (ADA) guidelines, hemoglobin A1c <7.0% represents optimal control in non-pregnant diabetic patients. Different metrics may apply to specific patient populations.  Standards of Medical Care in Diabetes(ADA). .   07/09/2016 04:28 AM 5.3 4.8 - 5.6 % Final    Comment:    (NOTE)         Pre-diabetes: 5.7 - 6.4         Diabetes: >6.4         Glycemic control for adults with diabetes: <7.0     CBG: Recent Labs  Lab 08/11/24 1703 08/11/24 2128 08/12/24 0614 08/12/24 1211 08/12/24 1548  GLUCAP 104* 111* 98 109* 120*    Review of Systems:   No chest pain, And to review systems otherwise negative  Past Medical History:  She,  has a past medical history of Abnormal EKG, AICD (automatic cardioverter/defibrillator) present, Alcoholic intoxication without complication, Allergy, Cardiomyopathy, ischemic, Cataract, Chest pain (08/29/2016), Chest pain (08/29/2016), CHF (congestive heart failure) (HCC), Chronic systolic heart failure (HCC) (98/70/7981), Coronary artery disease, Facial laceration, Fall (07/07/2016), Hyperlipidemia, Hypertension (03/03/2017), Hyponatremia, Hypothyroidism (07/08/2016), NSTEMI (non-ST elevated myocardial infarction) (HCC), Syncope, and Thyroid  disease.   Surgical History:   Past Surgical History:  Procedure Laterality Date   AXILLARY-FEMORAL BYPASS  GRAFT Bilateral 07/29/2024   Procedure: CREATION, BYPASS, ARTERIAL, AXILLARY TO BILATERAL FEMORAL, USING GRAFT;  Surgeon: Lanis Fonda BRAVO, MD;  Location: [facility] OR;  Service: Vascular;  Laterality: Bilateral;   CARDIAC CATHETERIZATION N/A 07/08/2016   Procedure: Left Heart Cath and Coronary Angiography;  Surgeon: Lonni Hanson, MD;  Location: [facility] INVASIVE CV LAB;  Service: Cardiovascular;  Laterality: N/A;   CARDIAC CATHETERIZATION N/A 08/29/2016   Procedure: Coronary Atherectomy;  Surgeon: Lonni Hanson, MD;  Location: [facility] INVASIVE CV LAB;  Service: Cardiovascular;  Laterality: N/A;   CARDIAC CATHETERIZATION N/A 08/29/2016   Procedure: Coronary Stent Intervention;  Surgeon: Lonni Hanson, MD;  Location: [facility] INVASIVE CV LAB;  Service: Cardiovascular;  Laterality: N/A;   CATARACT EXTRACTION, BILATERAL     COLONOSCOPY WITH PROPOFOL  N/A 11/18/2021   Procedure: COLONOSCOPY WITH PROPOFOL ;  Surgeon: Eda Iha, MD;  Location: [facility] ENDOSCOPY;  Service: Gastroenterology;  Laterality: N/A;   CORONARY ANGIOPLASTY     coronary stents      ENDARTERECTOMY FEMORAL Bilateral 07/29/2024   Procedure: ENDARTERECTOMY, FEMORAL;  Surgeon: Lanis Fonda BRAVO, MD;  Location: [facility] OR;  Service: Vascular;  Laterality: Bilateral;   HAND SURGERY Right    Dr Alyse Gaba   ICD IMPLANT N/A 08/28/2019   Procedure: ICD IMPLANT;  Surgeon: Inocencio Soyla Lunger, MD;  Location: [facility] INVASIVE CV LAB;  Service: Cardiovascular;  Laterality: N/A;   LEFT HEART CATH AND CORONARY ANGIOGRAPHY N/A 04/29/2022   Procedure: LEFT HEART CATH AND CORONARY ANGIOGRAPHY;  Surgeon: Anner Alm ORN, MD;  Location: [facility] INVASIVE CV LAB;  Service: Cardiovascular;  Laterality: N/A;   LOWER EXTREMITY ANGIOGRAPHY N/A 06/19/2024   Procedure: Lower Extremity Angiography;  Surgeon: Darron Deatrice LABOR, MD;  Location: [facility] INVASIVE CV LAB;  Service: Cardiovascular;  Laterality: N/A;   PARATHYROIDECTOMY Right 02/02/2021   Procedure: RIGHT SUPERIOR  PARATHYROIDECTOMY;  Surgeon: Eletha Boas, MD;  Location: [facility] ORS;  Service: General;  Laterality: Right;   POLYPECTOMY  11/18/2021   Procedure: POLYPECTOMY;  Surgeon: Eda Iha, MD;  Location:  [facility] ENDOSCOPY;  Service: Gastroenterology;;   RIGHT HEART CATH N/A 08/11/2022   Procedure: RIGHT HEART CATH;  Surgeon: Gardenia Led, DO;  Location: [facility] INVASIVE CV LAB;  Service: Cardiovascular;  Laterality: N/A;   RIGHT HEART CATH N/A 08/07/2024   Procedure: RIGHT HEART CATH;  Surgeon: Rolan Ezra RAMAN, MD;  Location: [facility] INVASIVE CV LAB;  Service: Cardiovascular;  Laterality: N/A;   VAGINAL HYSTERECTOMY       Social History:   reports that she quit smoking about 8 years ago. Her smoking use included cigarettes. She started smoking about 58 years ago. She has a 50 pack-year smoking history. She has never used smokeless tobacco. She reports that she does not currently use alcohol . She reports that she does not use drugs.   Family History:  Her family history includes Heart disease in her mother and sister; Kidney failure in her mother. There is no history of Colon cancer, Esophageal cancer, Rectal cancer, or Stomach cancer.   Allergies Allergies[1]   Home Medications  Prior to Admission medications  Medication Sig Start Date End Date Taking? Authorizing Provider  acetaminophen  (TYLENOL ) 500 MG tablet Take 1,000 mg by mouth every 6 (six) hours as needed for moderate pain.   Yes [provider]  albuterol  (VENTOLIN  HFA) 108 (90 Base) MCG/ACT inhaler Inhale 2 puffs into the lungs every 6 (six) hours as needed for wheezing or shortness of breath. 12/28/23  Yes Hope Almarie ORN, NP  aspirin  81 MG chewable tablet Chew 81 mg by mouth daily.   Yes [provider]  atorvastatin  (LIPITOR ) 80 MG tablet TAKE 1 TABLET(80 MG) BY MOUTH DAILY 11/27/18  Yes Verlin Lonni BIRCH, MD  bisoprolol  (ZEBETA ) 5 MG tablet Take 0.5 tablets (2.5 mg total) by mouth daily. 06/26/24  Yes Milford,  Harlene HERO, FNP  CALCIUM  PO Take 1 tablet by mouth daily.   Yes [provider]  Cholecalciferol (VITAMIN D3) 50 MCG (2000 UT) TABS Take 2,000 Units by mouth daily.   Yes [provider]  clopidogrel  (PLAVIX ) 75 MG tablet TAKE 1 TABLET(75 MG) BY MOUTH DAILY WITH BREAKFAST 11/27/23  Yes Sabharwal, Aditya, DO  digoxin  (LANOXIN ) 0.125 MG tablet Take 0.5 tablets (0.0625 mg total) by mouth daily. 11/22/23  Yes Milford, Harlene HERO, FNP  furosemide  (LASIX ) 20 MG tablet TAKE 1 TABLET BY MOUTH AS NEEDED 02/27/24  Yes Sabharwal, Aditya, DO  levothyroxine  (SYNTHROID ) 25 MCG tablet Take 37.5 mcg by mouth daily before breakfast. 07/10/23  Yes Thapa, Sudan, MD  nitroGLYCERIN  (NITROSTAT ) 0.4 MG SL tablet Place 1 tablet (0.4 mg total) under the tongue every 5 (five) minutes x 3 doses as needed for chest pain. 06/14/24  Yes Verlin Lonni BIRCH, MD  nystatin  cream (MYCOSTATIN ) Apply 1 Application topically 2 (two) times daily as needed (rash). 05/09/22  Yes [provider]  ranolazine  (RANEXA ) 1000 MG SR tablet TAKE 1 TABLET(1000 MG) BY MOUTH TWICE DAILY 02/09/24  Yes Milford, Epworth, FNP  sacubitril -valsartan  (ENTRESTO ) 49-51 MG Take 1 tablet by mouth 2 (two) times daily. 05/01/24  Yes Clegg, Amy D, NP  spironolactone  (ALDACTONE ) 25 MG tablet TAKE 1 TABLET(25 MG) BY MOUTH DAILY 07/03/23  Yes Sabharwal, Aditya, DO  umeclidinium-vilanterol (ANORO ELLIPTA ) 62.5-25 MCG/ACT AEPB Inhale 1 puff into the lungs daily. 12/28/23  Yes Hope Almarie ORN, NP  valACYclovir  (VALTREX ) 1000 MG tablet Take 1,000 mg by mouth daily.   Yes [provider]     Critical care time: n/a               [  1]  Allergies Allergen Reactions   Lisinopril  Cough    Dry cough   "

## 2024-08-12 NOTE — Plan of Care (Signed)
" °  Problem: Education: Goal: Knowledge of General Education information will improve Description: Including pain rating scale, medication(s)/side effects and non-pharmacologic comfort measures Outcome: Progressing   Problem: Health Behavior/Discharge Planning: Goal: Ability to manage health-related needs will improve Outcome: Progressing   Problem: Clinical Measurements: Goal: Will remain free from infection Outcome: Progressing   Problem: Coping: Goal: Level of anxiety will decrease Outcome: Progressing   Problem: Elimination: Goal: Will not experience complications related to bowel motility Outcome: Progressing Goal: Will not experience complications related to urinary retention Outcome: Progressing   Problem: Pain Managment: Goal: General experience of comfort will improve and/or be controlled Outcome: Progressing   Problem: Safety: Goal: Ability to remain free from injury will improve Outcome: Progressing   Problem: Skin Integrity: Goal: Risk for impaired skin integrity will decrease Outcome: Progressing   Problem: Education: Goal: Knowledge of the prescribed therapeutic regimen will improve Outcome: Progressing   Problem: Bowel/Gastric: Goal: Gastrointestinal status for postoperative course will improve Outcome: Progressing   Problem: Cardiac: Goal: Ability to maintain an adequate cardiac output Outcome: Progressing Goal: Will show no evidence of cardiac arrhythmias Outcome: Progressing   Problem: Nutritional: Goal: Will attain and maintain optimal nutritional status Outcome: Progressing   Problem: Neurological: Goal: Will regain or maintain usual level of consciousness Outcome: Progressing   Problem: Clinical Measurements: Goal: Ability to maintain clinical measurements within normal limits Outcome: Progressing Goal: Postoperative complications will be avoided or minimized Outcome: Progressing   Problem: Respiratory: Goal: Will regain and/or  maintain adequate ventilation Outcome: Progressing Goal: Respiratory status will improve Outcome: Progressing   Problem: Skin Integrity: Goal: Demonstrates signs of wound healing without infection Outcome: Progressing   Problem: Urinary Elimination: Goal: Will remain free from infection Outcome: Progressing Goal: Ability to achieve and maintain adequate urine output Outcome: Progressing   Problem: Education: Goal: Ability to demonstrate management of disease process will improve Outcome: Progressing Goal: Ability to verbalize understanding of medication therapies will improve Outcome: Progressing Goal: Individualized Educational Video(s) Outcome: Progressing   Problem: Activity: Goal: Capacity to carry out activities will improve Outcome: Progressing   Problem: Cardiac: Goal: Ability to achieve and maintain adequate cardiopulmonary perfusion will improve Outcome: Progressing   Problem: Education: Goal: Ability to describe self-care measures that may prevent or decrease complications (Diabetes Survival Skills Education) will improve Outcome: Progressing Goal: Individualized Educational Video(s) Outcome: Progressing   Problem: Coping: Goal: Ability to adjust to condition or change in health will improve Outcome: Progressing   Problem: Fluid Volume: Goal: Ability to maintain a balanced intake and output will improve Outcome: Progressing   Problem: Health Behavior/Discharge Planning: Goal: Ability to identify and utilize available resources and services will improve Outcome: Progressing Goal: Ability to manage health-related needs will improve Outcome: Progressing   Problem: Metabolic: Goal: Ability to maintain appropriate glucose levels will improve Outcome: Progressing   Problem: Nutritional: Goal: Maintenance of adequate nutrition will improve Outcome: Progressing Goal: Progress toward achieving an optimal weight will improve Outcome: Progressing   Problem:  Skin Integrity: Goal: Risk for impaired skin integrity will decrease Outcome: Progressing   Problem: Tissue Perfusion: Goal: Adequacy of tissue perfusion will improve Outcome: Progressing   Problem: Education: Goal: Understanding of CV disease, CV risk reduction, and recovery process will improve Outcome: Progressing Goal: Individualized Educational Video(s) Outcome: Progressing   "

## 2024-08-12 NOTE — Plan of Care (Signed)

## 2024-08-12 NOTE — Progress Notes (Addendum)
 " Progress Note    08/12/2024 8:14 AM 5 Days Post-Op  Subjective: No complaints, was hypotensive overnight   Vitals:   08/12/24 0216 08/12/24 0349  BP: (!) 79/39 (!) 101/49  Pulse: 64 68  Resp: 18 14  Temp: 97.7 F (36.5 C) 97.7 F (36.5 C)  SpO2: 100% 96%    Physical Exam: General: Chronically ill-appearing, no acute distress Cardiac: Regular rate and rhythm Lungs: Nonlabored, on supplemental oxygen Incisions: Right infraclavicular incision dry and intact.  Left groin incision dry and intact.  Right groin with Prevena VAC with good seal, serosanguineous output Extremities: BLE warm and well-perfused  CBC    Component Value Date/Time   WBC 18.0 (H) 08/12/2024 0229   RBC 2.48 (L) 08/12/2024 0229   HGB 7.7 (L) 08/12/2024 0229   HGB 13.6 06/11/2024 1039   HCT 23.3 (L) 08/12/2024 0229   HCT 39.0 06/11/2024 1039   PLT 365 08/12/2024 0229   PLT 335 06/11/2024 1039   MCV 94.0 08/12/2024 0229   MCV 102 (H) 06/11/2024 1039   MCH 31.0 08/12/2024 0229   MCHC 33.0 08/12/2024 0229   RDW 17.6 (H) 08/12/2024 0229   RDW 13.1 06/11/2024 1039   LYMPHSABS 0.5 (L) 08/06/2024 1143   LYMPHSABS 2.9 08/25/2016 1648   MONOABS 1.0 08/06/2024 1143   EOSABS 0.0 08/06/2024 1143   EOSABS 0.2 08/25/2016 1648   BASOSABS 0.0 08/06/2024 1143   BASOSABS 0.0 08/25/2016 1648    BMET    Component Value Date/Time   NA 128 (L) 08/12/2024 0229   NA 132 (L) 06/11/2024 1039   K 4.3 08/12/2024 0229   CL 89 (L) 08/12/2024 0229   CO2 32 08/12/2024 0229   GLUCOSE 109 (H) 08/12/2024 0229   BUN 35 (H) 08/12/2024 0229   BUN 15 06/11/2024 1039   CREATININE 1.35 (H) 08/12/2024 0229   CREATININE 1.13 (H) 05/30/2024 0851   CALCIUM  9.3 08/12/2024 0229   GFRNONAA 41 (L) 08/12/2024 0229   GFRAA 78 08/19/2019 0830    INR    Component Value Date/Time   INR 1.1 08/06/2024 1640     Intake/Output Summary (Last 24 hours) at 08/12/2024 0814 Last data filed at 08/12/2024 0300 Gross per 24 hour  Intake  691.19 ml  Output 250 ml  Net 441.19 ml      Assessment/Plan:  74 y.o. female is s/p: Right axillary bifemoral bypass on 07/29/2024   - She is resting comfortably this morning without any complaints.  She denies any pain.  She does remain in critical condition due to fluid overload, pulmonary insufficiency, malnutrition -Right infraclavicular and left groin incisions are intact and dry -Right groin incision with Prevena VAC with good seal.  Serosanguineous output in canister.  Will keep in place until Wednesday -Bilateral lower extremities remain well-perfused s/p bypass -Leukocytosis continues to trend upwards, at 18 this morning.  Unsure of etiology.  No concern for infection from bypass at this time   Ahmed Holster, [place] [facility] [phone] 08/12/2024 8:14 AM  VASCULAR STAFF ADDENDUM: I have independently interviewed and examined the patient. I agree with the above.  Patient continues to do poorly.  She has had no major improvement since her admission.  I had a long conversation with her today regarding her overall clinical status.  There is no question, that she has the willpower, unfortunately her body is failing her.  After our discussion, [name] elected to move to DNR/DNI.  I have updated Dr. Vann.  Truth Wolaver E  Lanis MD [facility] Phone Number: [phone] 08/12/2024 11:54 AM    "

## 2024-08-12 NOTE — Progress Notes (Signed)
 " PROGRESS NOTE    Pam [name]  FMW:[id number] DOB: 1950-11-18 DOA: 07/24/2024 PCP: Andrew Truman GRADE., MD    Brief Narrative:  74 y.o. 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  Admission has been complicated by fluid overload, hypotension and respiratory insufficiency.  On 1/5 evening hrs had 4 episodes of VT triggering ICD shocks. Administered additional amiodarone  bolus. Milrinone  stopped. K 3.2 , Mg 1.6. has chronic LAD. W/ mild ST elevation. During events there was change in mental status with concern for possible seizure however this was felt primarily to be syncopal event associated with the VT.  Transferred to [facility] on 1/8. 1/9-hemoptysis-PCCM re-consulted 1/9-LUE DVT 1/10- heparin  started 1/11-creatinine trending up and blood pressure trending down-Entresto  held    Assessment and Plan:  Acute hypoxic respiratory failure secondary to diffuse pulmonary infiltrates consistent with Worsening pulmonary edema, complicated further by small bilateral pleural effusions, +/- pneumonia -improving  - Required increasing amount of oxygen overnight-CTA is suggestive of pleural effusion/fluid but no pulmonary emboli -continue scheduled bronchodilators - Finish Zosyn  (sputum culture showing Pseudomonas)-repeat culture with Klebsiella so have started another IV antibiotic (cefepime )   Acute on chronic systolic heart failure.  Baseline EF 30 to 35%, current EF  remains the same.  There is left ventricular regional wall abnormalities RV size moderately enlarged with moderately enlarged pulmonary artery systolic pressures Continue telemetry monitoring -diuresis Per cardiology team    UE duplex: positive for DVT in the left axillary vein and positive for an SVT in the cephalic vein.  -hematolysis resolved -will start heparin  (after discussion with vascular- can change to DOAC once we know patient's not bleeding)  Acute on Chronic Anemia  etiology unclear Monitor Hb. -Status post PRBC   History of known coronary artery disease with chronically occluded LAD.  Currently with ST elevation in V1 and V2, with elevated troponin.  Troponin is flat.  Likely demand ischemia.   -Defer to cardiology   VT.  Status post ICD shock x 4 amiodarone  infusion changed to p.o. Ensure potassium greater than 4 and magnesium  greater than 2 Telemetry monitoring -Defer to cardiology   Lactic acidosis. -resolved   Brief change in mental status-delirium  CT head shows nothing acute.  EEG shows no seizures. -Appears to be back to baseline   Acute on chronic hyponatremia.  Suspect secondary to heart failure. Status post tolvaptan  on 08/06/2024. Daily labs    Hypokalemia, hypochloremia, Hypomagnesemia. -replete   Hypothyroidism Continuing Synthroid    Critical limb ischemia status post right sided axillobifemoral bypass with right common femoral endarterectomy on 12/29 Mild bleeding for heparin  shot from left arm -Aspirin  as well as statin - no plan for plavix  in light of DOAC need     Hyperglycemia. -SSI  Hypertension - Midodrine   Pressure ulcers Wound 07/24/24 1830 Pressure Injury Sacrum Mid Stage 3 -  Full thickness tissue loss. Subcutaneous fat may be visible but bone, tendon or muscle are NOT exposed. (Active)     Wound 08/05/24 Pressure Injury Buttocks Right;Left Stage 3 -  Full thickness tissue loss. Subcutaneous fat may be visible but bone, tendon or muscle are NOT exposed. (Active)     Patient not progressing well, will get palliative care consult.  Appreciate Dr. Silver discussing CODE STATUS with patient, she is now a DNR  DVT prophylaxis: SCDs Start: 08/05/24 2136 PLACE TED HOSE Start: 07/31/24 1415    Code Status: Limited: Do not attempt resuscitation (DNR) -DNR-LIMITED -Do Not  Intubate/DNI    Disposition Plan:  Level of care: Progressive Status is: Inpatient     Consultants:   Cardiology PCCM Neurology Vascular   Subjective: Blood pressure has been low on and off Patient very fatigued today with worsening shortness of breath   Objective: Vitals:   08/12/24 0855 08/12/24 0923 08/12/24 0930 08/12/24 1200  BP: (!) 72/46 (!) 92/53 (!) 94/44 97/68  Pulse: 69 68 67 72  Resp: 17 (!) 22 (!) 22 19  Temp: 98.1 F (36.7 C)   98.6 F (37 C)  TempSrc: Oral   Oral  SpO2: 97%  100% 98%  Weight:      Height:        Intake/Output Summary (Last 24 hours) at 08/12/2024 1233 Last data filed at 08/12/2024 9147 Gross per 24 hour  Intake 811.19 ml  Output 250 ml  Net 561.19 ml   Filed Weights   08/10/24 0415 08/11/24 0123 08/12/24 0500  Weight: 62.8 kg 61.5 kg 61.1 kg    Examination:   General: Appearance:  Elderly frail female in no acute distress   Left upper arm swelling  Lungs:   Diminished  Heart:    Normal heart rate.    MS:   All extremities are intact.    Neurologic:   Awake, alert       Data Reviewed: I have personally reviewed following labs and imaging studies  CBC: Recent Labs  Lab 08/06/24 0501 08/06/24 0622 08/06/24 1143 08/06/24 1640 08/08/24 0543 08/09/24 0520 08/10/24 0615 08/11/24 0500 08/12/24 0229  WBC 9.1   < > 10.8*   < > 9.7 10.9* 14.4* 15.5* 18.0*  NEUTROABS 7.8*  --  9.3*  --   --   --   --   --   --   HGB 7.8*   < > 7.5*   < > 8.2* 8.5* 8.2* 8.6* 7.7*  HCT 22.2*   < > 21.7*   < > 23.9* 25.1* 24.1* 25.2* 23.3*  MCV 89.9   < > 91.2   < > 88.8 90.6 90.6 92.0 94.0  PLT 374   < > 389   < > 333 340 335 392 365   < > = values in this interval not displayed.   Basic Metabolic Panel: Recent Labs  Lab 08/06/24 0200 08/06/24 0500 08/08/24 0543 08/09/24 0520 08/10/24 0615 08/11/24 0500 08/12/24 0229  NA 122*   < > 130* 130* 129* 128* 128*  K 3.4*   < > 4.1 4.7 4.3 3.9 4.3  CL 84*   < > 90* 92* 90* 89* 89*  CO2 26   < > 31 29 32 31 32  GLUCOSE 191*   < > 102* 109* 102* 101* 109*  BUN 23   < > 32* 30* 34* 34*  35*  CREATININE 0.94   < > 1.02* 0.96 1.16* 1.40* 1.35*  CALCIUM  8.9   < > 9.3 9.4 9.5 9.6 9.3  MG 2.3   < > 2.1 2.0 1.9 1.9 1.9  PHOS 3.0  --   --   --   --   --   --    < > = values in this interval not displayed.   GFR: Estimated Creatinine Clearance: 30.7 mL/min (A) (by C-G formula based on SCr of 1.35 mg/dL (H)). Liver Function Tests: Recent Labs  Lab 08/06/24 1143  AST 36  ALT 21  ALKPHOS 54  BILITOT 0.4  PROT 5.3*  ALBUMIN  3.2*   No results  for input(s): LIPASE, AMYLASE in the last 168 hours. No results for input(s): AMMONIA in the last 168 hours. Coagulation Profile: Recent Labs  Lab 08/06/24 1640  INR 1.1   Cardiac Enzymes: No results for input(s): CKTOTAL, CKMB, CKMBINDEX, TROPONINI in the last 168 hours. BNP (last 3 results) Recent Labs    07/24/24 1016 08/02/24 1551 08/09/24 1626  PROBNP 4,537.0* 5,039.0* 8,218.0*   HbA1C: No results for input(s): HGBA1C in the last 72 hours. CBG: Recent Labs  Lab 08/11/24 1209 08/11/24 1703 08/11/24 2128 08/12/24 0614 08/12/24 1211  GLUCAP 100* 104* 111* 98 109*   Lipid Profile: No results for input(s): CHOL, HDL, LDLCALC, TRIG, CHOLHDL, LDLDIRECT in the last 72 hours. Thyroid  Function Tests: No results for input(s): TSH, T4TOTAL, FREET4, T3FREE, THYROIDAB in the last 72 hours. Anemia Panel: No results for input(s): VITAMINB12, FOLATE, FERRITIN, TIBC, IRON , RETICCTPCT in the last 72 hours.  Sepsis Labs: Recent Labs  Lab 08/05/24 2103 08/06/24 0133 08/06/24 0343 08/12/24 0230  LATICACIDVEN 5.7* 2.1* 1.1 1.2    Recent Results (from the past 240 hours)  Respiratory (~20 pathogens) panel by PCR     Status: None   Collection Time: 08/02/24 12:34 PM   Specimen: Nasopharyngeal Swab; Respiratory  Result Value Ref Range Status   Adenovirus NOT DETECTED NOT DETECTED Final   Coronavirus 229E NOT DETECTED NOT DETECTED Final    Comment: (NOTE) The Coronavirus  on the Respiratory Panel, DOES NOT test for the novel  Coronavirus (2019 nCoV)    Coronavirus HKU1 NOT DETECTED NOT DETECTED Final   Coronavirus NL63 NOT DETECTED NOT DETECTED Final   Coronavirus OC43 NOT DETECTED NOT DETECTED Final   Metapneumovirus NOT DETECTED NOT DETECTED Final   Rhinovirus / Enterovirus NOT DETECTED NOT DETECTED Final   Influenza A NOT DETECTED NOT DETECTED Final   Influenza B NOT DETECTED NOT DETECTED Final   Parainfluenza Virus 1 NOT DETECTED NOT DETECTED Final   Parainfluenza Virus 2 NOT DETECTED NOT DETECTED Final   Parainfluenza Virus 3 NOT DETECTED NOT DETECTED Final   Parainfluenza Virus 4 NOT DETECTED NOT DETECTED Final   Respiratory Syncytial Virus NOT DETECTED NOT DETECTED Final   Bordetella pertussis NOT DETECTED NOT DETECTED Final   Bordetella Parapertussis NOT DETECTED NOT DETECTED Final   Chlamydophila pneumoniae NOT DETECTED NOT DETECTED Final   Mycoplasma pneumoniae NOT DETECTED NOT DETECTED Final    Comment: Performed at [facility], [address] [place], [place] [address]  Resp panel by RT-PCR (RSV, Flu A&B, Covid) Anterior Nasal Swab     Status: None   Collection Time: 08/02/24 12:34 PM   Specimen: Anterior Nasal Swab  Result Value Ref Range Status   SARS Coronavirus 2 by RT PCR NEGATIVE NEGATIVE Final   Influenza A by PCR NEGATIVE NEGATIVE Final   Influenza B by PCR NEGATIVE NEGATIVE Final    Comment: (NOTE) The Xpert Xpress SARS-CoV-2/FLU/RSV plus assay is intended as an aid in the diagnosis of influenza from Nasopharyngeal swab specimens and should not be used as a sole basis for treatment. Nasal washings and aspirates are unacceptable for Xpert Xpress SARS-CoV-2/FLU/RSV testing.  Fact Sheet for Patients: bloggercourse.com  Fact Sheet for Healthcare Providers: seriousbroker.it  This test is not yet approved or cleared by the [place]  FDA and has been authorized for  detection and/or diagnosis of SARS-CoV-2 by FDA under an Emergency Use Authorization (EUA). This EUA will remain in effect (meaning this test can be used) for the duration of the  COVID-19 declaration under Section 564(b)(1) of the Act, 21 U.S.C. section 360bbb-3(b)(1), unless the authorization is terminated or revoked.     Resp Syncytial Virus by PCR NEGATIVE NEGATIVE Final    Comment: (NOTE) Fact Sheet for Patients: bloggercourse.com  Fact Sheet for Healthcare Providers: seriousbroker.it  This test is not yet approved or cleared by the [place]  FDA and has been authorized for detection and/or diagnosis of SARS-CoV-2 by FDA under an Emergency Use Authorization (EUA). This EUA will remain in effect (meaning this test can be used) for the duration of the COVID-19 declaration under Section 564(b)(1) of the Act, 21 U.S.C. section 360bbb-3(b)(1), unless the authorization is terminated or revoked.  Performed at [facility], [address], [place], [place] [address]   Expectorated Sputum Assessment w Gram Stain, Rflx to Resp Cult     Status: None   Collection Time: 08/04/24 11:48 AM   Specimen: Sputum  Result Value Ref Range Status   Specimen Description SPU  Final   Special Requests SPU  Final   Sputum evaluation   Final    THIS SPECIMEN IS ACCEPTABLE FOR SPUTUM CULTURE Performed at [facility], [address] [place], [place] [address]    Report Status 08/05/2024 FINAL  Final  Culture, Respiratory w Gram Stain     Status: None   Collection Time: 08/04/24 11:48 AM   Specimen: Sputum  Result Value Ref Range Status   Specimen Description SPU  Final   Special Requests SPU Reflexed from [id number]  Final   Gram Stain   Final    RARE WBC SEEN NO ORGANISMS SEEN Performed at [facility], [address], [place], [place] [address]    Culture FEW PSEUDOMONAS AERUGINOSA  Final   Report Status 08/08/2024 FINAL   Final   Organism ID, Bacteria PSEUDOMONAS AERUGINOSA  Final      Susceptibility   Pseudomonas aeruginosa - MIC*    MEROPENEM 2 SENSITIVE Sensitive     CIPROFLOXACIN <=0.06 SENSITIVE Sensitive     IMIPENEM >=16 RESISTANT Resistant     PIP/TAZO Value in next row Sensitive      <=4 SENSITIVEThis is a modified FDA-approved test that has been validated and its performance characteristics determined by the reporting laboratory.  This laboratory is certified under the Clinical Laboratory Improvement Amendments CLIA as qualified to perform high complexity clinical laboratory testing.    CEFEPIME  Value in next row Sensitive      <=4 SENSITIVEThis is a modified FDA-approved test that has been validated and its performance characteristics determined by the reporting laboratory.  This laboratory is certified under the Clinical Laboratory Improvement Amendments CLIA as qualified to perform high complexity clinical laboratory testing.    CEFTAZIDIME/AVIBACTAM Value in next row Sensitive      <=4 SENSITIVEThis is a modified FDA-approved test that has been validated and its performance characteristics determined by the reporting laboratory.  This laboratory is certified under the Clinical Laboratory Improvement Amendments CLIA as qualified to perform high complexity clinical laboratory testing.    CEFTOLOZANE/TAZOBACTAM Value in next row Sensitive      <=4 SENSITIVEThis is a modified FDA-approved test that has been validated and its performance characteristics determined by the reporting laboratory.  This laboratory is certified under the Clinical Laboratory Improvement Amendments CLIA as qualified to perform high complexity clinical laboratory testing.    TOBRAMYCIN Value in next row Sensitive      <=4 SENSITIVEThis is a modified FDA-approved test that has been  validated and its performance characteristics determined by the reporting laboratory.  This laboratory is certified under the Clinical Laboratory  Improvement Amendments CLIA as qualified to perform high complexity clinical laboratory testing.    CEFTAZIDIME Value in next row Sensitive      <=4 SENSITIVEThis is a modified FDA-approved test that has been validated and its performance characteristics determined by the reporting laboratory.  This laboratory is certified under the Clinical Laboratory Improvement Amendments CLIA as qualified to perform high complexity clinical laboratory testing.    * FEW PSEUDOMONAS AERUGINOSA  MRSA Next Gen by PCR, Nasal     Status: None   Collection Time: 08/04/24 12:30 PM   Specimen: Nasal Mucosa; Nasal Swab  Result Value Ref Range Status   MRSA by PCR Next Gen NOT DETECTED NOT DETECTED Final    Comment: (NOTE) The GeneXpert MRSA Assay (FDA approved for NASAL specimens only), is one component of a comprehensive MRSA colonization surveillance program. It is not intended to diagnose MRSA infection nor to guide or monitor treatment for MRSA infections. Test performance is not FDA approved in patients less than 61 years old. Performed at [facility], [address] [place], [place] [address]   MRSA Next Gen by PCR, Nasal     Status: None   Collection Time: 08/06/24 12:57 AM   Specimen: Nasal Mucosa; Nasal Swab  Result Value Ref Range Status   MRSA by PCR Next Gen NOT DETECTED NOT DETECTED Final    Comment: (NOTE) The GeneXpert MRSA Assay (FDA approved for NASAL specimens only), is one component of a comprehensive MRSA colonization surveillance program. It is not intended to diagnose MRSA infection nor to guide or monitor treatment for MRSA infections. Test performance is not FDA approved in patients less than 70 years old. Performed at [facility], [address], [place], [place] [address]   Respiratory (~20 pathogens) panel by PCR     Status: None   Collection Time: 08/06/24  8:17 AM   Specimen: Nasopharyngeal Swab; Respiratory  Result Value Ref Range Status   Adenovirus NOT  DETECTED NOT DETECTED Final   Coronavirus 229E NOT DETECTED NOT DETECTED Final    Comment: (NOTE) The Coronavirus on the Respiratory Panel, DOES NOT test for the novel  Coronavirus (2019 nCoV)    Coronavirus HKU1 NOT DETECTED NOT DETECTED Final   Coronavirus NL63 NOT DETECTED NOT DETECTED Final   Coronavirus OC43 NOT DETECTED NOT DETECTED Final   Metapneumovirus NOT DETECTED NOT DETECTED Final   Rhinovirus / Enterovirus NOT DETECTED NOT DETECTED Final   Influenza A NOT DETECTED NOT DETECTED Final   Influenza B NOT DETECTED NOT DETECTED Final   Parainfluenza Virus 1 NOT DETECTED NOT DETECTED Final   Parainfluenza Virus 2 NOT DETECTED NOT DETECTED Final   Parainfluenza Virus 3 NOT DETECTED NOT DETECTED Final   Parainfluenza Virus 4 NOT DETECTED NOT DETECTED Final   Respiratory Syncytial Virus NOT DETECTED NOT DETECTED Final   Bordetella pertussis NOT DETECTED NOT DETECTED Final   Bordetella Parapertussis NOT DETECTED NOT DETECTED Final   Chlamydophila pneumoniae NOT DETECTED NOT DETECTED Final   Mycoplasma pneumoniae NOT DETECTED NOT DETECTED Final    Comment: Performed at [facility], [address] [place], [place] [address]  Surgical pcr screen     Status: None   Collection Time: 08/07/24  1:58 AM   Specimen: Nasal Mucosa; Nasal Swab  Result Value Ref Range Status   MRSA, PCR NEGATIVE NEGATIVE Final  Staphylococcus aureus NEGATIVE NEGATIVE Final    Comment: (NOTE) The Xpert SA Assay (FDA approved for NASAL specimens in patients 44 years of age and older), is one component of a comprehensive surveillance program. It is not intended to diagnose infection nor to guide or monitor treatment. Performed at [facility], [address] [place], [place] [address]   Expectorated Sputum Assessment w Gram Stain, Rflx to Resp Cult     Status: None   Collection Time: 08/09/24  7:44 AM   Specimen: Expectorated Sputum  Result Value Ref Range Status   Specimen Description  EXPECTORATED SPUTUM  Final   Special Requests NONE  Final   Sputum evaluation   Final    THIS SPECIMEN IS ACCEPTABLE FOR SPUTUM CULTURE Performed at [facility], [address], [place], [place] [address]    Report Status 08/09/2024 FINAL  Final  Culture, Respiratory w Gram Stain     Status: None   Collection Time: 08/09/24  7:44 AM  Result Value Ref Range Status   Specimen Description EXPECTORATED SPUTUM  Final   Special Requests NONE Reflexed from [id number]  Final   Gram Stain   Final    FEW WBC PRESENT, PREDOMINANTLY PMN NO ORGANISMS SEEN Performed at [facility], [address], [place], [place] [address]    Culture FEW KLEBSIELLA OXYTOCA  Final   Report Status 08/11/2024 FINAL  Final   Organism ID, Bacteria KLEBSIELLA OXYTOCA  Final      Susceptibility   Klebsiella oxytoca - MIC*    AMPICILLIN >=32 RESISTANT Resistant     CEFEPIME  <=0.12 SENSITIVE Sensitive     ERTAPENEM <=0.12 SENSITIVE Sensitive     CEFTRIAXONE  1 SENSITIVE Sensitive     CIPROFLOXACIN <=0.06 SENSITIVE Sensitive     GENTAMICIN  <=1 SENSITIVE Sensitive     MEROPENEM <=0.25 SENSITIVE Sensitive     TRIMETH/SULFA <=20 SENSITIVE Sensitive     AMPICILLIN/SULBACTAM >=32 RESISTANT Resistant     PIP/TAZO Value in next row Resistant      >=128 RESISTANTThis is a modified FDA-approved test that has been validated and its performance characteristics determined by the reporting laboratory.  This laboratory is certified under the Clinical Laboratory Improvement Amendments CLIA as qualified to perform high complexity clinical laboratory testing.    * FEW KLEBSIELLA OXYTOCA         Radiology Studies: No results found.       Scheduled Meds:  amiodarone   200 mg Oral BID   arformoterol   15 mcg Nebulization BID   aspirin  EC  81 mg Oral Q0600   atorvastatin   80 mg Oral Daily   Chlorhexidine  Gluconate Cloth  6 each Topical Daily   feeding supplement  237 mL Oral BID BM   furosemide   60 mg Intravenous  BID   insulin  aspart  0-6 Units Subcutaneous TID WC   ipratropium-albuterol   3 mL Nebulization BID   levothyroxine   37.5 mcg Oral Q0600   mirtazapine   7.5 mg Oral QHS   mouth rinse  15 mL Mouth Rinse 4 times per day   ranolazine   1,000 mg Oral BID   revefenacin   175 mcg Nebulization Daily   sodium chloride  flush  10-40 mL Intracatheter Q12H   sodium chloride  flush  3 mL Intravenous Q12H   valACYclovir   1,000 mg Oral Daily   Continuous Infusions:  ceFEPime  (MAXIPIME ) IV 2 g (08/12/24 1018)   heparin  1,100 Units/hr (08/12/24 0929)     LOS: 19 days  Time spent: 45 minutes spent on chart review, discussion with nursing staff, consultants, updating family and interview/physical exam; more than 50% of that time was spent in counseling and/or coordination of care.    Harlene RAYMOND Bowl, DO [facility] Available via Epic secure chat 7am-7pm After these hours, please refer to coverage provider listed on [email or web address] 08/12/2024, 12:33 PM   "

## 2024-08-12 NOTE — Progress Notes (Signed)
 Patient BP 76/44 at this time. Dr Juvenal and Dr Rolan notified. Patient states that she feels fatigued but no dizziness or heart palpitations.  Midodrine  ordered and BP medications Dc'd. CVP 12.   0930 BP 94/44, cycling q15  0940 Midodrine  administered and Lasix  60 mg administered per Dr Johnita Skeens order.   1028 Patient had episode of SOB and CP. O2 increased to 7L and EKG performed. O2 at 94% on 7L. Patient feeling panicked.  Doyal, RN - charge in room. Dr Rolan and Dr Juvenal notified. EKG result NSR.

## 2024-08-13 ENCOUNTER — Telehealth (HOSPITAL_COMMUNITY): Payer: Self-pay

## 2024-08-13 ENCOUNTER — Ambulatory Visit (HOSPITAL_COMMUNITY): Admitting: Cardiology

## 2024-08-13 ENCOUNTER — Other Ambulatory Visit (HOSPITAL_COMMUNITY): Payer: Self-pay

## 2024-08-13 DIAGNOSIS — I5023 Acute on chronic systolic (congestive) heart failure: Secondary | ICD-10-CM | POA: Diagnosis not present

## 2024-08-13 DIAGNOSIS — J9601 Acute respiratory failure with hypoxia: Secondary | ICD-10-CM | POA: Diagnosis not present

## 2024-08-13 LAB — CBC
HCT: 21.3 % — ABNORMAL LOW (ref 36.0–46.0)
Hemoglobin: 7.2 g/dL — ABNORMAL LOW (ref 12.0–15.0)
MCH: 31.6 pg (ref 26.0–34.0)
MCHC: 33.8 g/dL (ref 30.0–36.0)
MCV: 93.4 fL (ref 80.0–100.0)
Platelets: 323 K/uL (ref 150–400)
RBC: 2.28 MIL/uL — ABNORMAL LOW (ref 3.87–5.11)
RDW: 18 % — ABNORMAL HIGH (ref 11.5–15.5)
WBC: 12.9 K/uL — ABNORMAL HIGH (ref 4.0–10.5)
nRBC: 0 % (ref 0.0–0.2)

## 2024-08-13 LAB — BASIC METABOLIC PANEL WITH GFR
Anion gap: 7 (ref 5–15)
BUN: 33 mg/dL — ABNORMAL HIGH (ref 8–23)
CO2: 31 mmol/L (ref 22–32)
Calcium: 9.3 mg/dL (ref 8.9–10.3)
Chloride: 89 mmol/L — ABNORMAL LOW (ref 98–111)
Creatinine, Ser: 1.29 mg/dL — ABNORMAL HIGH (ref 0.44–1.00)
GFR, Estimated: 44 mL/min — ABNORMAL LOW
Glucose, Bld: 103 mg/dL — ABNORMAL HIGH (ref 70–99)
Potassium: 3.9 mmol/L (ref 3.5–5.1)
Sodium: 127 mmol/L — ABNORMAL LOW (ref 135–145)

## 2024-08-13 LAB — PREPARE RBC (CROSSMATCH)

## 2024-08-13 LAB — HEPARIN LEVEL (UNFRACTIONATED): Heparin Unfractionated: 0.4 [IU]/mL (ref 0.30–0.70)

## 2024-08-13 LAB — GLUCOSE, CAPILLARY
Glucose-Capillary: 118 mg/dL — ABNORMAL HIGH (ref 70–99)
Glucose-Capillary: 135 mg/dL — ABNORMAL HIGH (ref 70–99)
Glucose-Capillary: 145 mg/dL — ABNORMAL HIGH (ref 70–99)
Glucose-Capillary: 147 mg/dL — ABNORMAL HIGH (ref 70–99)

## 2024-08-13 MED ORDER — FUROSEMIDE 10 MG/ML IJ SOLN
10.0000 mg/h | INTRAVENOUS | Status: DC
  Administered 2024-08-13: 10 mg/h via INTRAVENOUS
  Filled 2024-08-13: qty 20

## 2024-08-13 MED ORDER — TRANEXAMIC ACID FOR INHALATION
500.0000 mg | Freq: Once | RESPIRATORY_TRACT | Status: AC
  Administered 2024-08-13: 500 mg via RESPIRATORY_TRACT
  Filled 2024-08-13: qty 10

## 2024-08-13 MED ORDER — MORPHINE SULFATE (PF) 2 MG/ML IV SOLN
1.0000 mg | INTRAVENOUS | Status: DC | PRN
  Administered 2024-08-13: 2 mg via INTRAVENOUS
  Administered 2024-08-13: 4 mg via INTRAVENOUS
  Administered 2024-08-13: 3 mg via INTRAVENOUS
  Administered 2024-08-14: 4 mg via INTRAVENOUS
  Administered 2024-08-14 (×2): 2 mg via INTRAVENOUS
  Filled 2024-08-13: qty 2
  Filled 2024-08-13: qty 1
  Filled 2024-08-13: qty 2
  Filled 2024-08-13: qty 1
  Filled 2024-08-13: qty 2
  Filled 2024-08-13: qty 1

## 2024-08-13 MED ORDER — RANOLAZINE ER 500 MG PO TB12
500.0000 mg | ORAL_TABLET | Freq: Two times a day (BID) | ORAL | Status: DC
  Administered 2024-08-13: 500 mg via ORAL
  Filled 2024-08-13: qty 1

## 2024-08-13 MED ORDER — POLYVINYL ALCOHOL 1.4 % OP SOLN: 1.0000 [drp] | Freq: Four times a day (QID) | OPHTHALMIC | Status: DC | PRN

## 2024-08-13 MED ORDER — ONDANSETRON HCL 4 MG/2ML IJ SOLN
4.0000 mg | Freq: Four times a day (QID) | INTRAMUSCULAR | Status: DC | PRN
  Administered 2024-08-14: 4 mg via INTRAVENOUS
  Filled 2024-08-13: qty 2

## 2024-08-13 MED ORDER — HALOPERIDOL LACTATE 5 MG/ML IJ SOLN: 0.5000 mg | INTRAMUSCULAR | Status: DC | PRN

## 2024-08-13 MED ORDER — BIOTENE DRY MOUTH MT LIQD: 15.0000 mL | OROMUCOSAL | Status: DC | PRN

## 2024-08-13 MED ORDER — NYSTATIN 100000 UNIT/ML MT SUSP
5.0000 mL | Freq: Four times a day (QID) | OROMUCOSAL | Status: DC
  Administered 2024-08-13 (×2): 500000 [IU] via ORAL
  Filled 2024-08-13 (×2): qty 5

## 2024-08-13 MED ORDER — GLYCOPYRROLATE 0.2 MG/ML IJ SOLN
0.2000 mg | INTRAMUSCULAR | Status: DC | PRN
  Administered 2024-08-14: 0.2 mg via INTRAVENOUS
  Filled 2024-08-13: qty 1

## 2024-08-13 MED ORDER — POTASSIUM CHLORIDE CRYS ER 20 MEQ PO TBCR: 20.0000 meq | EXTENDED_RELEASE_TABLET | Freq: Once | ORAL | Status: DC

## 2024-08-13 MED ORDER — FUROSEMIDE 10 MG/ML IJ SOLN: 80.0000 mg | Freq: Four times a day (QID) | INTRAMUSCULAR | Status: DC

## 2024-08-13 MED ORDER — ACETAMINOPHEN 325 MG PO TABS: 650.0000 mg | ORAL_TABLET | Freq: Four times a day (QID) | ORAL | Status: DC | PRN

## 2024-08-13 MED ORDER — MORPHINE SULFATE (PF) 2 MG/ML IV SOLN
1.0000 mg | INTRAVENOUS | Status: DC | PRN
  Administered 2024-08-13: 1 mg via INTRAVENOUS
  Filled 2024-08-13: qty 1

## 2024-08-13 MED ORDER — POTASSIUM CHLORIDE CRYS ER 20 MEQ PO TBCR
40.0000 meq | EXTENDED_RELEASE_TABLET | Freq: Once | ORAL | Status: AC
  Administered 2024-08-13: 40 meq via ORAL
  Filled 2024-08-13: qty 2

## 2024-08-13 MED ORDER — GLYCOPYRROLATE 0.2 MG/ML IJ SOLN: 0.2000 mg | INTRAMUSCULAR | Status: DC | PRN

## 2024-08-13 MED ORDER — LORAZEPAM 2 MG/ML IJ SOLN: 1.0000 mg | INTRAMUSCULAR | Status: DC | PRN

## 2024-08-13 MED ORDER — MORPHINE SULFATE (PF) 2 MG/ML IV SOLN
0.5000 mg | INTRAVENOUS | Status: DC | PRN
  Administered 2024-08-13: 0.5 mg via INTRAVENOUS
  Filled 2024-08-13: qty 1

## 2024-08-13 MED ORDER — ONDANSETRON 4 MG PO TBDP: 4.0000 mg | ORAL_TABLET | Freq: Four times a day (QID) | ORAL | Status: DC | PRN

## 2024-08-13 MED ORDER — LORAZEPAM 2 MG/ML PO CONC: 1.0000 mg | ORAL | Status: DC | PRN

## 2024-08-13 MED ORDER — HALOPERIDOL LACTATE 2 MG/ML PO CONC: 0.5000 mg | ORAL | Status: DC | PRN

## 2024-08-13 MED ORDER — SODIUM CHLORIDE 0.9% IV SOLUTION: Freq: Once | INTRAVENOUS | Status: AC

## 2024-08-13 MED ORDER — ACETAMINOPHEN 650 MG RE SUPP: 650.0000 mg | Freq: Four times a day (QID) | RECTAL | Status: DC | PRN

## 2024-08-13 MED ORDER — FUROSEMIDE 10 MG/ML IJ SOLN
80.0000 mg | Freq: Once | INTRAMUSCULAR | Status: AC
  Administered 2024-08-13: 80 mg via INTRAVENOUS
  Filled 2024-08-13: qty 8

## 2024-08-13 MED ORDER — GLYCOPYRROLATE 1 MG PO TABS: 1.0000 mg | ORAL_TABLET | ORAL | Status: DC | PRN

## 2024-08-13 MED ORDER — MAGNESIUM SULFATE 2 GM/50ML IV SOLN
2.0000 g | Freq: Once | INTRAVENOUS | Status: AC
  Administered 2024-08-13: 2 g via INTRAVENOUS
  Filled 2024-08-13: qty 50

## 2024-08-13 MED ORDER — HALOPERIDOL 0.5 MG PO TABS: 0.5000 mg | ORAL_TABLET | ORAL | Status: DC | PRN

## 2024-08-13 MED ORDER — LORAZEPAM 1 MG PO TABS: 1.0000 mg | ORAL_TABLET | ORAL | Status: DC | PRN

## 2024-08-13 NOTE — Progress Notes (Signed)
 " PROGRESS NOTE    Pam [name]  FMW:[id number] DOB: 02-09-1951 DOA: 07/24/2024 PCP: Andrew Truman GRADE., MD    Brief Narrative:  74 y.o. 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  Admission has been complicated by fluid overload, hypotension and respiratory insufficiency.  On 1/5 evening hrs had 4 episodes of VT triggering ICD shocks. Administered additional amiodarone  bolus. Milrinone  stopped. K 3.2 , Mg 1.6. has chronic LAD. W/ mild ST elevation. During events there was change in mental status with concern for possible seizure however this was felt primarily to be syncopal event associated with the VT.  Transferred to [facility] on 1/8. 1/9-hemoptysis-PCCM re-consulted 1/9-LUE DVT 1/10- heparin  started 1/12-palliative care consulted after discussion with family, patient made DNR 1/13-hemoglobin trending down with no overt signs of bleeding-getting 1 unit PRBC    Assessment and Plan:  Acute hypoxic respiratory failure secondary to diffuse pulmonary infiltrates consistent with Worsening pulmonary edema, complicated further by small bilateral pleural effusions, +/- pneumonia -improving  - Required increasing amount of oxygen overnight-CTA is suggestive of pleural effusion/fluid but no pulmonary emboli -continue scheduled bronchodilators - Finished Zosyn  (sputum culture showing Pseudomonas)-repeat culture growing Klebsiella so have started another IV antibiotic (cefepime )   Acute on chronic systolic heart failure.  Baseline EF 30 to 35%, current EF  remains the same.  There is left ventricular regional wall abnormalities RV size moderately enlarged with moderately enlarged pulmonary artery systolic pressures Continue telemetry monitoring -diuresis Per cardiology team  ? Thrush - Added nystatin  swish and swallow   UE duplex: positive for DVT in the left axillary vein and positive for an SVT in the cephalic vein.  -hematolysis  resolved -will start heparin  (after discussion with vascular- can change to DOAC once we know patient's not bleeding)  Acute on Chronic Anemia etiology unclear Monitor Hb. -Status post PRBC   History of known coronary artery disease with chronically occluded LAD.  Currently with ST elevation in V1 and V2, with elevated troponin.  Troponin is flat.  Likely demand ischemia.   -Defer to cardiology   VT.  Status post ICD shock x 4 amiodarone  infusion changed to p.o. Ensure potassium greater than 4 and magnesium  greater than 2 Telemetry monitoring -Defer to cardiology   Lactic acidosis. -resolved   Brief change in mental status-delirium  CT head shows nothing acute.  EEG shows no seizures. -Appears to be back to baseline   Acute on chronic hyponatremia.  Suspect secondary to heart failure. Status post tolvaptan  on 08/06/2024. Daily labs    Hypokalemia, hypochloremia, Hypomagnesemia. -replete   Hypothyroidism Continuing Synthroid    Critical limb ischemia status post right sided axillobifemoral bypass with right common femoral endarterectomy on 12/29 Mild bleeding for heparin  shot from left arm -Aspirin  as well as statin - no plan for plavix  in light of DOAC need     Hyperglycemia. -SSI  hypotension - Midodrine   Pressure ulcers Wound 07/24/24 1830 Pressure Injury Sacrum Mid Stage 3 -  Full thickness tissue loss. Subcutaneous fat may be visible but bone, tendon or muscle are NOT exposed. (Active)     Wound 08/05/24 Pressure Injury Buttocks Right;Left Stage 3 -  Full thickness tissue loss. Subcutaneous fat may be visible but bone, tendon or muscle are NOT exposed. (Active)     Overall poor prognosis-palliative care to meet with patient and family to discuss goals of care  DVT prophylaxis: Place TED hose Start: 08/13/24 0734 SCDs Start: 08/05/24 2136 PLACE  TED HOSE Start: 07/31/24 1415    Code Status: Limited: Do not attempt resuscitation (DNR) -DNR-LIMITED -Do Not  Intubate/DNI    Disposition Plan:  Level of care: Progressive Status is: Inpatient     Consultants:  Cardiology PCCM Neurology Vascular   Subjective: Agreeable for 1 unit PRBC Still with cough, dry at times and feeling like things are getting stuck in her throat  Objective: Vitals:   08/13/24 0226 08/13/24 0700 08/13/24 0809 08/13/24 0818  BP:   (!) 91/49   Pulse:  67 70 68  Resp:  19 16 16   Temp:   98.8 F (37.1 C)   TempSrc:   Oral   SpO2:  99% 96% 100%  Weight: 61.3 kg     Height:        Intake/Output Summary (Last 24 hours) at 08/13/2024 1039 Last data filed at 08/13/2024 0920 Gross per 24 hour  Intake 1929.44 ml  Output 175 ml  Net 1754.44 ml   Filed Weights   08/11/24 0123 08/12/24 0500 08/13/24 0226  Weight: 61.5 kg 61.1 kg 61.3 kg    Examination:   General: Appearance:  Elderly frail female in no acute distress   Left upper arm swelling  Lungs:   Diminished, no wheezing  Heart:    Normal heart rate.    MS:   All extremities are intact.    Neurologic:   Awake, alert       Data Reviewed: I have personally reviewed following labs and imaging studies  CBC: Recent Labs  Lab 08/06/24 1143 08/06/24 1640 08/09/24 0520 08/10/24 0615 08/11/24 0500 08/12/24 0229 08/13/24 0532  WBC 10.8*   < > 10.9* 14.4* 15.5* 18.0* 12.9*  NEUTROABS 9.3*  --   --   --   --   --   --   HGB 7.5*   < > 8.5* 8.2* 8.6* 7.7* 7.2*  HCT 21.7*   < > 25.1* 24.1* 25.2* 23.3* 21.3*  MCV 91.2   < > 90.6 90.6 92.0 94.0 93.4  PLT 389   < > 340 335 392 365 323   < > = values in this interval not displayed.   Basic Metabolic Panel: Recent Labs  Lab 08/08/24 0543 08/09/24 0520 08/10/24 0615 08/11/24 0500 08/12/24 0229 08/13/24 0532  NA 130* 130* 129* 128* 128* 127*  K 4.1 4.7 4.3 3.9 4.3 3.9  CL 90* 92* 90* 89* 89* 89*  CO2 31 29 32 31 32 31  GLUCOSE 102* 109* 102* 101* 109* 103*  BUN 32* 30* 34* 34* 35* 33*  CREATININE 1.02* 0.96 1.16* 1.40* 1.35* 1.29*   CALCIUM  9.3 9.4 9.5 9.6 9.3 9.3  MG 2.1 2.0 1.9 1.9 1.9  --    GFR: Estimated Creatinine Clearance: 32.1 mL/min (A) (by C-G formula based on SCr of 1.29 mg/dL (H)). Liver Function Tests: Recent Labs  Lab 08/06/24 1143  AST 36  ALT 21  ALKPHOS 54  BILITOT 0.4  PROT 5.3*  ALBUMIN  3.2*   No results for input(s): LIPASE, AMYLASE in the last 168 hours. No results for input(s): AMMONIA in the last 168 hours. Coagulation Profile: Recent Labs  Lab 08/06/24 1640  INR 1.1   Cardiac Enzymes: No results for input(s): CKTOTAL, CKMB, CKMBINDEX, TROPONINI in the last 168 hours. BNP (last 3 results) Recent Labs    07/24/24 1016 08/02/24 1551 08/09/24 1626  PROBNP 4,537.0* 5,039.0* 8,218.0*   HbA1C: No results for input(s): HGBA1C in the last 72 hours. CBG: Recent Labs  Lab 08/12/24 0614 08/12/24 1211 08/12/24 1548 08/12/24 2056 08/13/24 0547  GLUCAP 98 109* 120* 124* 118*   Lipid Profile: No results for input(s): CHOL, HDL, LDLCALC, TRIG, CHOLHDL, LDLDIRECT in the last 72 hours. Thyroid  Function Tests: No results for input(s): TSH, T4TOTAL, FREET4, T3FREE, THYROIDAB in the last 72 hours. Anemia Panel: No results for input(s): VITAMINB12, FOLATE, FERRITIN, TIBC, IRON , RETICCTPCT in the last 72 hours.  Sepsis Labs: Recent Labs  Lab 08/12/24 0230  LATICACIDVEN 1.2    Recent Results (from the past 240 hours)  Expectorated Sputum Assessment w Gram Stain, Rflx to Resp Cult     Status: None   Collection Time: 08/04/24 11:48 AM   Specimen: Sputum  Result Value Ref Range Status   Specimen Description SPU  Final   Special Requests SPU  Final   Sputum evaluation   Final    THIS SPECIMEN IS ACCEPTABLE FOR SPUTUM CULTURE Performed at [facility], [address], [place], [place] [address]    Report Status 08/05/2024 FINAL  Final  Culture, Respiratory w Gram Stain     Status: None   Collection Time: 08/04/24 11:48  AM   Specimen: Sputum  Result Value Ref Range Status   Specimen Description SPU  Final   Special Requests SPU Reflexed from [id number]  Final   Gram Stain   Final    RARE WBC SEEN NO ORGANISMS SEEN Performed at [facility], [address] [place], [place] [address]    Culture FEW PSEUDOMONAS AERUGINOSA  Final   Report Status 08/08/2024 FINAL  Final   Organism ID, Bacteria PSEUDOMONAS AERUGINOSA  Final      Susceptibility   Pseudomonas aeruginosa - MIC*    MEROPENEM 2 SENSITIVE Sensitive     CIPROFLOXACIN <=0.06 SENSITIVE Sensitive     IMIPENEM >=16 RESISTANT Resistant     PIP/TAZO Value in next row Sensitive      <=4 SENSITIVEThis is a modified FDA-approved test that has been validated and its performance characteristics determined by the reporting laboratory.  This laboratory is certified under the Clinical Laboratory Improvement Amendments CLIA as qualified to perform high complexity clinical laboratory testing.    CEFEPIME  Value in next row Sensitive      <=4 SENSITIVEThis is a modified FDA-approved test that has been validated and its performance characteristics determined by the reporting laboratory.  This laboratory is certified under the Clinical Laboratory Improvement Amendments CLIA as qualified to perform high complexity clinical laboratory testing.    CEFTAZIDIME/AVIBACTAM Value in next row Sensitive      <=4 SENSITIVEThis is a modified FDA-approved test that has been validated and its performance characteristics determined by the reporting laboratory.  This laboratory is certified under the Clinical Laboratory Improvement Amendments CLIA as qualified to perform high complexity clinical laboratory testing.    CEFTOLOZANE/TAZOBACTAM Value in next row Sensitive      <=4 SENSITIVEThis is a modified FDA-approved test that has been validated and its performance characteristics determined by the reporting laboratory.  This laboratory is certified under the Clinical Laboratory  Improvement Amendments CLIA as qualified to perform high complexity clinical laboratory testing.    TOBRAMYCIN Value in next row Sensitive      <=4 SENSITIVEThis is a modified FDA-approved test that has been validated and its performance characteristics determined by the reporting laboratory.  This laboratory is certified under the Clinical Laboratory Improvement Amendments CLIA as qualified to perform high complexity clinical laboratory testing.    CEFTAZIDIME Value in  next row Sensitive      <=4 SENSITIVEThis is a modified FDA-approved test that has been validated and its performance characteristics determined by the reporting laboratory.  This laboratory is certified under the Clinical Laboratory Improvement Amendments CLIA as qualified to perform high complexity clinical laboratory testing.    * FEW PSEUDOMONAS AERUGINOSA  MRSA Next Gen by PCR, Nasal     Status: None   Collection Time: 08/04/24 12:30 PM   Specimen: Nasal Mucosa; Nasal Swab  Result Value Ref Range Status   MRSA by PCR Next Gen NOT DETECTED NOT DETECTED Final    Comment: (NOTE) The GeneXpert MRSA Assay (FDA approved for NASAL specimens only), is one component of a comprehensive MRSA colonization surveillance program. It is not intended to diagnose MRSA infection nor to guide or monitor treatment for MRSA infections. Test performance is not FDA approved in patients less than 11 years old. Performed at [facility], [address], [place], [place] [address]   MRSA Next Gen by PCR, Nasal     Status: None   Collection Time: 08/06/24 12:57 AM   Specimen: Nasal Mucosa; Nasal Swab  Result Value Ref Range Status   MRSA by PCR Next Gen NOT DETECTED NOT DETECTED Final    Comment: (NOTE) The GeneXpert MRSA Assay (FDA approved for NASAL specimens only), is one component of a comprehensive MRSA colonization surveillance program. It is not intended to diagnose MRSA infection nor to guide or monitor treatment for MRSA  infections. Test performance is not FDA approved in patients less than 18 years old. Performed at [facility], [address] [place], [place] [address]   Respiratory (~20 pathogens) panel by PCR     Status: None   Collection Time: 08/06/24  8:17 AM   Specimen: Nasopharyngeal Swab; Respiratory  Result Value Ref Range Status   Adenovirus NOT DETECTED NOT DETECTED Final   Coronavirus 229E NOT DETECTED NOT DETECTED Final    Comment: (NOTE) The Coronavirus on the Respiratory Panel, DOES NOT test for the novel  Coronavirus (2019 nCoV)    Coronavirus HKU1 NOT DETECTED NOT DETECTED Final   Coronavirus NL63 NOT DETECTED NOT DETECTED Final   Coronavirus OC43 NOT DETECTED NOT DETECTED Final   Metapneumovirus NOT DETECTED NOT DETECTED Final   Rhinovirus / Enterovirus NOT DETECTED NOT DETECTED Final   Influenza A NOT DETECTED NOT DETECTED Final   Influenza B NOT DETECTED NOT DETECTED Final   Parainfluenza Virus 1 NOT DETECTED NOT DETECTED Final   Parainfluenza Virus 2 NOT DETECTED NOT DETECTED Final   Parainfluenza Virus 3 NOT DETECTED NOT DETECTED Final   Parainfluenza Virus 4 NOT DETECTED NOT DETECTED Final   Respiratory Syncytial Virus NOT DETECTED NOT DETECTED Final   Bordetella pertussis NOT DETECTED NOT DETECTED Final   Bordetella Parapertussis NOT DETECTED NOT DETECTED Final   Chlamydophila pneumoniae NOT DETECTED NOT DETECTED Final   Mycoplasma pneumoniae NOT DETECTED NOT DETECTED Final    Comment: Performed at [facility], [address] [place], [place] [address]  Surgical pcr screen     Status: None   Collection Time: 08/07/24  1:58 AM   Specimen: Nasal Mucosa; Nasal Swab  Result Value Ref Range Status   MRSA, PCR NEGATIVE NEGATIVE Final   Staphylococcus aureus NEGATIVE NEGATIVE Final    Comment: (NOTE) The Xpert SA Assay (FDA approved for NASAL specimens in patients 6 years of age and older), is one component of a comprehensive surveillance program. It is  not  intended to diagnose infection nor to guide or monitor treatment. Performed at [facility], [address] [place], [place] [address]   Expectorated Sputum Assessment w Gram Stain, Rflx to Resp Cult     Status: None   Collection Time: 08/09/24  7:44 AM   Specimen: Expectorated Sputum  Result Value Ref Range Status   Specimen Description EXPECTORATED SPUTUM  Final   Special Requests NONE  Final   Sputum evaluation   Final    THIS SPECIMEN IS ACCEPTABLE FOR SPUTUM CULTURE Performed at [facility], [address] [place], [place] [address]    Report Status 08/09/2024 FINAL  Final  Culture, Respiratory w Gram Stain     Status: None   Collection Time: 08/09/24  7:44 AM  Result Value Ref Range Status   Specimen Description EXPECTORATED SPUTUM  Final   Special Requests NONE Reflexed from [id number]  Final   Gram Stain   Final    FEW WBC PRESENT, PREDOMINANTLY PMN NO ORGANISMS SEEN Performed at [facility], [address] [place], [place] [address]    Culture FEW KLEBSIELLA OXYTOCA  Final   Report Status 08/11/2024 FINAL  Final   Organism ID, Bacteria KLEBSIELLA OXYTOCA  Final      Susceptibility   Klebsiella oxytoca - MIC*    AMPICILLIN >=32 RESISTANT Resistant     CEFEPIME  <=0.12 SENSITIVE Sensitive     ERTAPENEM <=0.12 SENSITIVE Sensitive     CEFTRIAXONE  1 SENSITIVE Sensitive     CIPROFLOXACIN <=0.06 SENSITIVE Sensitive     GENTAMICIN  <=1 SENSITIVE Sensitive     MEROPENEM <=0.25 SENSITIVE Sensitive     TRIMETH/SULFA <=20 SENSITIVE Sensitive     AMPICILLIN/SULBACTAM >=32 RESISTANT Resistant     PIP/TAZO Value in next row Resistant      >=128 RESISTANTThis is a modified FDA-approved test that has been validated and its performance characteristics determined by the reporting laboratory.  This laboratory is certified under the Clinical Laboratory Improvement Amendments CLIA as qualified to perform high complexity clinical laboratory testing.    * FEW KLEBSIELLA  OXYTOCA         Radiology Studies: No results found.       Scheduled Meds:  sodium chloride    Intravenous Once   amiodarone   200 mg Oral BID   arformoterol   15 mcg Nebulization BID   aspirin  EC  81 mg Oral Q0600   atorvastatin   80 mg Oral Daily   Chlorhexidine  Gluconate Cloth  6 each Topical Daily   collagenase    Topical Daily   feeding supplement  237 mL Oral BID BM   insulin  aspart  0-6 Units Subcutaneous TID WC   ipratropium-albuterol   3 mL Nebulization BID   levothyroxine   37.5 mcg Oral Q0600   mirtazapine   7.5 mg Oral QHS   nystatin   5 mL Oral QID   mouth rinse  15 mL Mouth Rinse 4 times per day   ranolazine   500 mg Oral BID   revefenacin   175 mcg Nebulization Daily   sodium chloride  flush  10-40 mL Intracatheter Q12H   sodium chloride  flush  3 mL Intravenous Q12H   valACYclovir   1,000 mg Oral Daily   Continuous Infusions:  ceFEPime  (MAXIPIME ) IV 2 g (08/12/24 2246)   furosemide  (LASIX ) 200 mg in dextrose  5 % 100 mL (2 mg/mL) infusion     heparin  1,100 Units/hr (08/12/24 1444)     LOS: 20 days    Time spent: 45  minutes spent on chart review, discussion with nursing staff, consultants, updating family and interview/physical exam; more than 50% of that time was spent in counseling and/or coordination of care.    Harlene RAYMOND Bowl, DO [facility] Available via Epic secure chat 7am-7pm After these hours, please refer to coverage provider listed on [email or web address] 08/13/2024, 10:39 AM   "

## 2024-08-13 NOTE — Progress Notes (Signed)
 Physical Therapy Treatment Patient Details Name: Pam [name] MRN: [id number] DOB: Nov 20, 1950 Today's Date: 08/13/2024   History of Present Illness Pt is a 74 y.o female admitted 07/24/24 for SOB and bil LE edema. Chest x-ray showed pulmonary edema. Pt s/p R axillary bifemoral bypass 12/29. Pt underwent right heart cath 1/7. LUE DVT 1/9. Heparin  started 1/10. Pt with Hgb trending down, pending 1 unit PRBCs 1/13. PMHx: CAD, CHF, PVD, ICD, HLD, PAD, NSTEMI, cataracts.    PT Comments  Pt greeted supine in bed, pleasant and agreeable to PT session. She is making slow but steady progress towards her acute PT goals. Donned ted hose bilaterally while supine in bed. Her BP was stable initially on the low side, but improved with position changes and MAP >65. Pt transferred using RW with CGA. Utilized +2 assist for safety as well as line/lead management during OOB mobility. Pt took a few steps forward/backward from the recliner chair within the room twice. Distance limited d/t fatigue and pt c/o shakiness. Pt desaturated to 85% SpO2 on 3L HFNC, required bump up to 4L in order to recover SpO2 89-91%. Pt will benefit from intensive inpatient follow-up therapy, >3 hours/day to optimize independence and safety.    If plan is discharge home, recommend the following: A little help with walking and/or transfers;A little help with bathing/dressing/bathroom;Assist for transportation;Help with stairs or ramp for entrance;Assistance with cooking/housework   Can travel by [employer] walker (2 wheels);Wheelchair (measurements PT);Wheelchair cushion (measurements PT);BSC/3in1    Recommendations for Other Services       Precautions / Restrictions Precautions Precautions: Fall Recall of Precautions/Restrictions: Intact Precaution/Restrictions Comments: Prevena Wound Vac RLE; watch BP and SpO2 Restrictions Weight Bearing Restrictions Per Provider Order: No      Mobility  Bed Mobility Overal bed mobility: Needs Assistance Bed Mobility: Supine to Sit     Supine to sit: Contact guard, HOB elevated, Used rails     General bed mobility comments: Pt sat up on L side of bed with increased time. Assist to manage lines/leads. Cues for sequencing. She slowly brought BLE off EOB. Assist to elevate trunk and scoot hips fwd.    Transfers Overall transfer level: Needs assistance Equipment used: Rolling walker (2 wheels) Transfers: Sit to/from Stand, Bed to chair/wheelchair/BSC Sit to Stand: Contact guard assist   Step pivot transfers: Contact guard assist, +2 safety/equipment       General transfer comment: Pt stood from lowest bed height. Cued proper hand placement using RW. Powered up with CGA. Transferred to recliner chair on her left. Good eccentric control, reaching back for arm rest. Assist to manage lines/leads.    Ambulation/Gait Ambulation/Gait assistance: Contact guard assist, +2 safety/equipment Gait Distance (Feet): 5 Feet (x2) Assistive device: Rolling walker (2 wheels) Gait Pattern/deviations: Step-to pattern, Decreased step length - right, Decreased step length - left, Decreased stride length Gait velocity: decr     General Gait Details: Pt took a few steps forwards/backwards from recliner chair. She maintained upright posture with good proximity to RW. Cued PLB technique throughout. SpO2 85% on 3L, bumped up to 4L with recovery to 89-90%. Distance limited d/t fatigue.   Stairs             Wheelchair Mobility     Tilt Bed    Modified Rankin (Stroke Patients Only)       Balance Overall balance assessment: Needs assistance Sitting-balance support: No upper extremity supported, Feet supported Sitting  balance-Leahy Scale: Fair Sitting balance - Comments: Sat EOB   Standing balance support: Bilateral upper extremity supported, During functional activity, Reliant on assistive device for balance Standing  balance-Leahy Scale: Poor Standing balance comment: Pt dependent on RW                            Communication Communication Communication: Impaired Factors Affecting Communication: Hearing impaired  Cognition Arousal: Alert Behavior During Therapy: WFL for tasks assessed/performed   PT - Cognitive impairments: No apparent impairments                         Following commands: Intact      Cueing Cueing Techniques: Verbal cues  Exercises      General Comments General comments (skin integrity, edema, etc.): Husband present and supportive throughout session. Donned [employer]. BP: seated 96/55 (67), standing 118/99 (106), end of session in recliner chair with feet elevated 130/62 (81). Pt greeted on 3L O2 via HFNC. SpO2 96% at rest, desat to 85% during activity, bump up to 4L with recovery into 89-91%. Titrated back to 3L with SpO2 95% at rest.      Pertinent Vitals/Pain Pain Assessment Pain Assessment: Faces Faces Pain Scale: Hurts little more Pain Location: Generalized Pain Descriptors / Indicators: Discomfort, Aching Pain Intervention(s): Monitored during session, Limited activity within patient's tolerance, Repositioned    Home Living                          Prior Function            PT Goals (current goals can now be found in the care plan section) Acute Rehab PT Goals Patient Stated Goal: Regain strength and endurance PT Goal Formulation: With patient/family Time For Goal Achievement: 08/27/24 Potential to Achieve Goals: Fair Progress towards PT goals: Progressing toward goals;Goals updated    Frequency    Min 2X/week      PT Plan      Co-evaluation              AM-PAC PT 6 Clicks Mobility   Outcome Measure  Help needed turning from your back to your side while in a flat bed without using bedrails?: A Little Help needed moving from lying on your back to sitting on the side of a flat bed without using bedrails?:  A Little Help needed moving to and from a bed to a chair (including a wheelchair)?: A Little Help needed standing up from a chair using your arms (e.g., wheelchair or bedside chair)?: A Little Help needed to walk in hospital room?: Total Help needed climbing 3-5 steps with a railing? : Total 6 Click Score: 14    End of Session Equipment Utilized During Treatment: Oxygen Activity Tolerance: Patient tolerated treatment well Patient left: in chair;with call bell/phone within reach;with chair alarm set;with family/visitor present Nurse Communication: Mobility status PT Visit Diagnosis: Other abnormalities of gait and mobility (R26.89);Muscle weakness (generalized) (M62.81);Pain;Difficulty in walking, not elsewhere classified (R26.2)     Time: 1008-1040 PT Time Calculation (min) (ACUTE ONLY): 32 min  Charges:    $Gait Training: 8-22 mins $Therapeutic Activity: 8-22 mins PT General Charges $$ ACUTE PT VISIT: 1 Visit                     Randall SAUNDERS, PT, DPT Acute Rehabilitation Services Office: [phone] Secure Chat Preferred  Delon  CHRISTELLA Callander 08/13/2024, 12:49 PM

## 2024-08-13 NOTE — Consult Note (Signed)
 "                                                  Palliative Care Consult Note                                  Date: 08/13/2024   Patient Name: Pam [name]  DOB: 1950-10-30  MRN: [id number]  Age / Sex: 74 y.o., female  PCP: Andrew Truman GRADE., MD Referring Physician: Juvenal Harlene PENNER, DO  Reason for Consultation: Establishing goals of care  HPI/Patient Profile: 74 y.o. female  with past medical history of chronic systolic heart failure, ischemic cardiomyopathy s/p AICD, and CAD with chronically occluded LAD who was admitted on 07/24/2024 with critical limb ischemia and is status post right axillary bifemoral bypass on 07/29/2024.  Hospitalization has been complicated by acute respiratory failure secondary to worsening pulmonary edema, acute on chronic CHF, and V-tach status post ICD shock x 4.  Palliative Medicine has been consulted for goals of care discussions and complex medical decision making.   Clinical Assessment and Goals of Care:   Extensive chart review has been completed including labs, vital signs, imaging, progress/consult notes, orders, medications and available advance directive documents.    I met with patient and her husband Pam [name] to discuss diagnosis, prognosis, GOC, EOL wishes, disposition, and options. She is currently on 4-5 L oxygen. She is on lasix  and heparin  infusions. She has a right groin wound with prevena.   I introduced Palliative Medicine as specialized medical care for people living with serious illness. It focuses on providing relief from the symptoms and stress of a serious illness.   Created space and opportunity for patient and husband to express thoughts and feelings regarding current medical situation. Values and goals of care were attempted to be elicited.  Social History: Patient and husband have been married for 27 years. Together, they have 4 children from previous marriages. Prior to admission, patient was fully independent and  driving.  Discussion: We discussed patient's current illness and what it means in the larger context of her ongoing co-morbidities. Reviewed hospital course and current clinical condition.   Reviewed patient's multiple issues including respiratory failure, CHF, fluid overload, VT triggering ICD shocks, and  Patient and husband have a realistic understanding of her current medical situation. She tells me that she has a bad heart and bad lungs. She tells me she was high risk for surgery, and since then it has been one thing after another. She understands that her condition is not improving. She also tells me her doctors have said there is nothing else that can be done.   Patient expresses she is tired/frustrated with being in the hospital and wants to go home to enjoy the time she has left.  Husband reports understanding she would be going home to pass away. Discussed de-escalating current medical interventions, and allowing a natural course to occur. I shared my concern that her time may be quite limited when current supportive care is discontinued.  Provided education and counseling at length on the philosophy and benefits of hospice care. Discussed the focus is comfort and dignity, in the setting of end-stage illness. Discussed the benefits of hospice including personal care for the patient, support for the family, and DME in the  home.  Also discussed turning off patient's ICD to prevent it from firing and causing discomfort during the dying process. Patient prefers to do this once she is at home.   After further discussion, patient and husband want to proceed with a referral for home hospice and express interest in [facility]. Questions and concerns addressed. Emotional support provided.    Review of Systems  Constitutional:  Positive for fatigue.  Respiratory:  Positive for shortness of breath.     Objective:   Primary Diagnoses: Present on Admission:  Heart failure with  reduced ejection fraction (HCC)  Acute respiratory failure with hypoxia (HCC)  Pressure injury of skin  Elevated troponin  Hyponatremia  Critical limb ischemia of right lower extremity (HCC)  COPD (chronic obstructive pulmonary disease) (HCC)  Essential hypertension  Hypothyroidism  Limb ischemia   Physical Exam Vitals reviewed.  Constitutional:      General: She is not in acute distress.    Appearance: She is ill-appearing.  Cardiovascular:     Rate and Rhythm: Normal rate.  Pulmonary:     Effort: Accessory muscle usage present. No respiratory distress.  Neurological:     Mental Status: She is alert and oriented to person, place, and time.    Palliative Assessment/Data: PPS 40%     Assessment & Plan:   SUMMARY OF RECOMMENDATIONS   DNR/DNI  Continue current supportive care for now Goal is to get home with hospice - referral to [facility] Discussed turning off ICD - patient prefers to do this when she gets home PMT will follow-up tomorrow  Primary Decision Maker: PATIENT  Symptom Management:  Continue Dilaudid  0.5 mg IV every 3 hours as needed for pain  Prognosis:  < 2 weeks   Discussed with: Dr. Juvenal, [facility], and RN   Thank you for allowing us  to participate in the care of Pam [name]   I personally spent a total of 78 minutes in the care of the patient today including preparing to see the patient, getting/reviewing separately obtained history, performing a medically appropriate exam/evaluation, counseling and educating, referring and communicating with other health care professionals, and documenting clinical information in the EHR.   Signed by: Recardo Loll, NP Palliative Medicine Team  Team Phone # [phone]  For individual providers, please see AMION                "

## 2024-08-13 NOTE — Progress Notes (Addendum)
" ° °  Night Coverage Note  Patient was seen for hemoptysis and worsening hypoxia. Husband at bedside.   Patient is alert and oriented. She is tachypneic, sitting upright, and using accessory muscles, reports feeling SOB.   IV heparin  had been stopped and she was given TXA nebs and low-dose morphine .   Patient and her husband state that they were planning to meet with a hospice agency tomorrow to arrange home hospice.   We discussed the patient's acute worsening and the option of transitioning to full comfort care tonight in the hospital.   They want a few minutes to discuss with each other but agree with treating dyspnea with morphine  for now as she is already on 100% FiO2 and Lasix  infusion and still struggling.    Addendum (20:40): Patient and her husband are ready to transition to full comfort measures now. Plan to turn the monitor off and focus on treating her symptoms as she passes.  "

## 2024-08-13 NOTE — Progress Notes (Signed)
 Nutrition Follow-up  DOCUMENTATION CODES:   Non-severe (moderate) malnutrition in context of chronic illness  INTERVENTION:  Continue regular diet as ordered Ensure Plus High Protein po BID, each supplement provides 350 kcal and 20 grams of protein.  NUTRITION DIAGNOSIS:   Moderate Malnutrition related to chronic illness as evidenced by moderate fat depletion, severe muscle depletion. - remains applicable  GOAL:   Patient will meet greater than or equal to 90% of their needs - goal progressing  MONITOR:   Diet advancement, PO intake, Supplement acceptance, Labs, Weight trends  REASON FOR ASSESSMENT:   Consult Assessment of nutrition requirement/status, Wound healing  ASSESSMENT:   74 yo female admitted on with acute respiratory failure secondary to heart failure, critical limb ischemia requiring R axillobifemoral bypass and common femoral endarterectomy on 12/29. Pt with acute n chronic hyponatremia (baseline in mid to upper 120s) with SIADH per Endocrinologist PMH includes ICM, HF, CAD, HTN, HLD, hypothyroidism, COPD  12/24 Admitted with Limb Ischemia 12/29 OR with Vascular Surgery: R axillobifemoral bypass and common femoral endarterectomy  1/05 Transferred to ICU, BiPap, ?seizures-neurology consulted 1/07: RHC- normal filling pressured, mild pulmonary HTN 108: CT abdomen/chest-no PE, airspace and interstitial infiltrates throughout lungs, large bilateral effusions, emphysema   Of note, pt with worsening PNA, continues on abx therapy.  Per Vascular, pt with good perfusion to bilateral feet.  CVP up today, HF increased diuretic  Checked in with patient. She is sitting up in chair at time of visit. C/o feeling unwell for several reasons r/t multiple medical issues. She reports that her appetite was getting better but c/o a sore throat for which she has nystatin  swish and swallow ordered QID for question of thrush.   She is able to order foods that are soft enough for her  to tolerate. She is requesting to continue with regular diet for most liberal meal options. She also endorses consuming Ensure BID.   Meal completions: 1/10:100% breakfast, 50% lunch, 50% dinner 1/11: 100% breakfast 1/12: 100% breakfast, 100% lunch, 50% dinner 1/13: 75% breakfast  Current wt: 61.3 kg Admit wt: 52 kg  Medications: SSI 0-6 units TID, nystatin  swish/swallow QID, IV abx  Labs:  Sodium 127 Chloride 89 BUN 33 Cr 1.29 GFR 44 CBG's 109-124 x24 hours  Diet Order:   Diet Order             Diet regular Room service appropriate? Yes; Fluid consistency: Thin  Diet effective now                   EDUCATION NEEDS:   Not appropriate for education at this time  Skin:  Skin Assessment: Skin Integrity Issues: Skin Integrity Issues:: Unstageable Unstageable: mid sacrum (documented on 1/05-progressed from previously documented stage 2)  Last BM:  1/06  Height:   Ht Readings from Last 1 Encounters:  08/06/24 5' 3 (1.6 m)    Weight:   Wt Readings from Last 1 Encounters:  08/13/24 61.3 kg   BMI:  Body mass index is 23.93 kg/m.  Estimated Nutritional Needs:   Kcal:  1550-1750 kcals  Protein:  75-85 g  Fluid:  1.5 L  Royce Maris, RDN, LDN Clinical Nutrition See AMiON for contact information.

## 2024-08-13 NOTE — Telephone Encounter (Signed)
 Pharmacy Patient Advocate Encounter  Insurance verification completed.    The patient is insured through [employer]. Patient has Medicare and is not eligible for a copay card, but may be able to apply for patient assistance or Medicare RX Payment Plan (Patient Must reach out to their plan, if eligible for payment plan), if available.    Ran test claim for Farxiga 10mg  tablet and the current 30 day co-pay is $30.  Ran test claim for Jardiance  10mg  tablet and the current 30 day co-pay is $30.  Ran test claim for Generic Entresto  24-26mg  tablet and the current 30 day co-pay is $10.  Ran test claim for Eliquis 5mg  tablet and the current 30 day co-pay is $45.  Ran test claim for Xarelto 20mg  tablet and the current 30 day co-pay is $45.  This test claim was processed through [employer]- copay amounts may vary at other pharmacies due to [employer], or as the patient moves through the different stages of their insurance plan.

## 2024-08-13 NOTE — Significant Event (Signed)
 Rapid Response Event Note   Reason for Call :  Sever respiratory distress and hemoptysis  Initial Focused Assessment:  She is alert and oriented.  She is in sever respiratory distress.  Lung sounds are rhonchi through out.  She is coughing up bright red blood.   BP 159/82  HR 88  RR 22-25  O2 sat 85-92%   Interventions:  Lasix  gtt infusing Heparin  gtt stopped NT suction Tranexamic acid  nebulizer 0.5mg  Morphine  IV Heated High Flow [place] per RT & NRB as tolerated  Reassessment:  She is continuing to be very labored and distressed, having significant difficulty breathing. Dr Dyana at bedside, spoke with patient and husband regarding goals of care She continued to have more and more difficulty maintaining her O2 sat  1 mg Morphine  IV Transitioned to comfort care.  Plan of Care:     Event Summary:   MD Notified: Dr Juvenal & Dr Dyana Call Time: 1825 Arrival Time:  1842 End Time: 0845  Elvin Portland, RN

## 2024-08-13 NOTE — TOC Progression Note (Signed)
 Transition of Care [facility]) - Progression Note    Patient Details  Name: Pam [name] MRN: [id number] Date of Birth: 02-25-1951  Transition of [facility]) CM/SW Contact  Waddell Barnie Rama, RN Phone Number: 08/13/2024, 5:01 PM  Clinical Narrative:    Per palliative patient wants [facility] home hospice, she has a groin wound with praveena wound vac .  This NCM confirmed with patient , states yes wants home with hospice with [facility],  NCM sent referral to [facility] with [facility].    1/14- patient is now comfort care and going to [facility], notified CSW.                     Expected Discharge Plan and Services                                               Social Drivers of Health (SDOH) Interventions SDOH Screenings   Food Insecurity: No Food Insecurity (07/25/2024)  Housing: Unknown (07/25/2024)  Transportation Needs: No Transportation Needs (07/25/2024)  Utilities: Not At Risk (07/25/2024)  Alcohol  Screen: Low Risk (04/29/2022)  Financial Resource Strain: Low Risk (04/29/2022)  Social Connections: Unknown (07/30/2024)  Tobacco Use: Medium Risk (07/29/2024)    Readmission Risk Interventions     No data to display

## 2024-08-13 NOTE — Progress Notes (Addendum)
" °  Progress Note    08/13/2024 6:53 AM 6 Days Post-Op  Subjective:  sitting up in bed eating breakfast.  In good spirits.   Afebrile 98% 3LO2NC  Vitals:   08/12/24 1947 08/13/24 0000  BP:  (!) 90/51  Pulse: 69 69  Resp: 20 19  Temp:  98.7 F (37.1 C)  SpO2: 99% 97%    Physical Exam: General:  no distress Lungs:  non labored Incisions:  right groin with prevena with good seal; left groin is clean and healing nicely.  Right chest incision also healing nicely. Extremities:  bilateral feet are warm and well perfused.    CBC    Component Value Date/Time   WBC 12.9 (H) 08/13/2024 0532   RBC 2.28 (L) 08/13/2024 0532   HGB 7.2 (L) 08/13/2024 0532   HGB 13.6 06/11/2024 1039   HCT 21.3 (L) 08/13/2024 0532   HCT 39.0 06/11/2024 1039   PLT 323 08/13/2024 0532   PLT 335 06/11/2024 1039   MCV 93.4 08/13/2024 0532   MCV 102 (H) 06/11/2024 1039   MCH 31.6 08/13/2024 0532   MCHC 33.8 08/13/2024 0532   RDW 18.0 (H) 08/13/2024 0532   RDW 13.1 06/11/2024 1039   LYMPHSABS 0.5 (L) 08/06/2024 1143   LYMPHSABS 2.9 08/25/2016 1648   MONOABS 1.0 08/06/2024 1143   EOSABS 0.0 08/06/2024 1143   EOSABS 0.2 08/25/2016 1648   BASOSABS 0.0 08/06/2024 1143   BASOSABS 0.0 08/25/2016 1648    BMET    Component Value Date/Time   NA 127 (L) 08/13/2024 0532   NA 132 (L) 06/11/2024 1039   K 3.9 08/13/2024 0532   CL 89 (L) 08/13/2024 0532   CO2 31 08/13/2024 0532   GLUCOSE 103 (H) 08/13/2024 0532   BUN 33 (H) 08/13/2024 0532   BUN 15 06/11/2024 1039   CREATININE 1.29 (H) 08/13/2024 0532   CREATININE 1.13 (H) 05/30/2024 0851   CALCIUM  9.3 08/13/2024 0532   GFRNONAA 44 (L) 08/13/2024 0532   GFRAA 78 08/19/2019 0830    INR    Component Value Date/Time   INR 1.1 08/06/2024 1640     Intake/Output Summary (Last 24 hours) at 08/13/2024 0653 Last data filed at 08/13/2024 0300 Gross per 24 hour  Intake 1919.44 ml  Output 175 ml  Net 1744.44 ml      Assessment/Plan:  74 y.o. female  is s/p:  R axillary bifemoral bypass 07/29/2024 by Dr. Lanis for BLE CLI with rest pain   6 Days Post-Op   -bilateral feet warm and well perfused.  Incisions look good.  -leukocytosis improved this am.  Pt afebrile -acute blood loss anemia-slow decrease of hgb over past several days.  Transfuse per primary team.  Pt on heparin  gtt for RUE DVT -after long discussion with Dr. Lanis yesterday, pt elected to be DNI/DNR   Lucie Apt, PA-C [facility] [phone] 08/13/2024 6:53 AM  VASCULAR STAFF ADDENDUM: I have independently interviewed and examined the patient. I agree with the above.  Plan will be prevena dressing change tomorrow as the dressing continues to wick away serous fluid from anasarca   Fonda FORBES Lanis MD [facility] Phone Number: [phone] 08/13/2024 11:17 AM    "

## 2024-08-13 NOTE — TOC Progression Note (Signed)
 Transition of Care [facility]) - Progression Note    Patient Details  Name: Pam [name] MRN: [id number] Date of Birth: Oct 10, 1950  [facility]) CM/SW Contact  Arlana JINNY Nicholaus ISRAEL Phone Number: [phone] 08/13/2024, 11:19 AM  Clinical Narrative:   [facility] CSW reviewed patients chart for discharge readiness, patient not medically stable for d/c. ICM will continue to follow.   [facility] CSW/CM will continue to follow and monitor for dc readiness.                         Expected Discharge Plan and Services                                               Social Drivers of Health (SDOH) Interventions SDOH Screenings   Food Insecurity: No Food Insecurity (07/25/2024)  Housing: Unknown (07/25/2024)  Transportation Needs: No Transportation Needs (07/25/2024)  Utilities: Not At Risk (07/25/2024)  Alcohol  Screen: Low Risk (04/29/2022)  Financial Resource Strain: Low Risk (04/29/2022)  Social Connections: Unknown (07/30/2024)  Tobacco Use: Medium Risk (07/29/2024)    Readmission Risk Interventions     No data to display

## 2024-08-13 NOTE — Progress Notes (Signed)
" ° °  Inpatient Rehabilitation Admissions Coordinator   I continue to follow patient's progress at a distance. She continues to not be at a level to tolerate CIR level rehab.  Heron Leavell, RN, MSN Rehab Admissions Coordinator [phone] 08/13/2024 12:36 PM  "

## 2024-08-13 NOTE — Progress Notes (Signed)
 Patient developed hemoptysis again on 1/13 PM.  Heparin  gtt stopped.  Plan is for patient to transition to hospice at home.  Will try TXA nebs and IV Morphine  in an attempt to make patient comfortable and stop the bleeding.  Unfortunately she may progress to needing full comfort in the hospital.  Overnight coverage aware. Kraig Genis

## 2024-08-13 NOTE — Progress Notes (Addendum)
 PHARMACY - ANTICOAGULATION CONSULT NOTE  Pharmacy Consult for heparin  Indication: DVT in left axillary vein   Allergies[1]  Patient Measurements: Height: 5' 3 (160 cm) Weight: 61.3 kg (135 lb 1.6 oz) IBW/kg (Calculated) : 52.4 HEPARIN  DW (KG): 54.3  Vital Signs: Temp: 98.7 F (37.1 C) (01/13 0000) Temp Source: Oral (01/13 0000) BP: 90/51 (01/13 0000) Pulse Rate: 69 (01/13 0000)  Labs: Recent Labs    08/11/24 0500 08/11/24 1300 08/12/24 0048 08/12/24 0229 08/13/24 0532  HGB 8.6*  --   --  7.7* 7.2*  HCT 25.2*  --   --  23.3* 21.3*  PLT 392  --   --  365 323  HEPARINUNFRC 0.35 0.27* 0.27*  --  0.40  CREATININE 1.40*  --   --  1.35* 1.29*    Estimated Creatinine Clearance: 32.1 mL/min (A) (by C-G formula based on SCr of 1.29 mg/dL (H)).   Medical History: Past Medical History:  Diagnosis Date   Abnormal EKG    AICD (automatic cardioverter/defibrillator) present    [employer]   Alcoholic intoxication without complication    Allergy    Cardiomyopathy, ischemic    Cataract    Chest pain 08/29/2016   Chest pain 08/29/2016   CHF (congestive heart failure) (HCC)    Chronic systolic heart failure (HCC) 08/29/2016   Coronary artery disease    Facial laceration    Fall 07/07/2016   Hyperlipidemia    Hypertension 03/03/2017   Hyponatremia    Hypothyroidism 07/08/2016   NSTEMI (non-ST elevated myocardial infarction) (HCC)    Syncope    Thyroid  disease    Assessment: 74 yo female s/p R axillary bifemoral bypass 07/29/2024 with admission complicated by fluid overload, hypotension, and respiratory insufficiency. UE dulplex (1/9) positive for DVT in the left axillary vein and positive for an SVT in the cephalic vein. Anticoagulation was initially on hold due to hemoptysis, which has improved and now resolved.   Heparin  level therapeutic (0.4) on 1100 units/hr.  Hgb continues to trend down the past three days ((8.6 > 7.7 > 7.2), pltc stable.    AM update - 1 PRBC  ordered this morning.  CHF team also starting lasix  infusion.  Goal of Therapy:  Heparin  level 0.3-0.5 units/ml (due to recent hemoptysis)  Monitor platelets by anticoagulation protocol: Yes   Plan:  Continue heparin  infusion 1100 units/hr Check heparin  level and CBC daily while on heparin  Continue to monitor H&H and platelets, and signs/symptoms of bleed  Maurilio Fila, PharmD Clinical Pharmacist 08/13/2024  7:03 AM     [1]  Allergies Allergen Reactions   Lisinopril  Cough    Dry cough

## 2024-08-13 NOTE — Progress Notes (Addendum)
 Patient ID: Pam [name], female   DOB: Sep 15, 1950, 74 y.o.   MRN: [id number]     Advanced Heart Failure Rounding Note  Cardiologist: Lonni Cash, MD  [facility] Cardiologist: Establishing Dr. Rolan  Patient Profile  Pam [name] is a 74 y.o. female with  history of chronic HFrEF/mixed cardiomyopathy, CAD, PAD, chronic hyponatremia, COPD.    Admitted with acute respiratory failure, acute on chronic CHF and hyponatremia. Significant Events:   1/5: started on Milrinone  for low co-ox, 53% 1/5: moved to CCU for acute hypoxic respiratory failure requiring Bipap, ?seizure like activity. CXR w/ pulm edema. Runs of VT treated w/ ICD shocks, milrinone  d/ced  (K 3.2, Mg 1.6) 1/7: RHC showed normal filling pressures, mild pulmonary hypertension, normal CO, good PAPi 1/8: CTA chest with no PE, airspace and interstitial infiltrates throughout lungs, large bilateral effusions, emphysema.  1/9: RUE US : DVT noted.  Subjective:    SBP 90s overnight.   Klebsiella in sputum, now on cefepime .  WBCs 18>12.9.   No further VT   Hgb 8.6 => 7.7>7.2, no overt bleeding.  Heparin  gtt for LUE DVT.   Scr 1.35>1.29 with diuresis. I&O not accurate. Weight up 1lb. CVP 12/13  Feels about the same as yesterday, still with some SOB. Sitting up in chair during meals for the most part.   Objective:   Weight Range: 61.3 kg Body mass index is 23.93 kg/m.   Vital Signs:   Temp:  [98.1 F (36.7 C)-98.7 F (37.1 C)] 98.7 F (37.1 C) (01/13 0000) Pulse Rate:  [67-73] 69 (01/13 0000) Resp:  [16-24] 19 (01/13 0000) BP: (72-98)/(35-68) 90/51 (01/13 0000) SpO2:  [93 %-100 %] 97 % (01/13 0000) Weight:  [61.3 kg] 61.3 kg (01/13 0226) Last BM Date : 08/13/24  Weight change: Filed Weights   08/11/24 0123 08/12/24 0500 08/13/24 0226  Weight: 61.5 kg 61.1 kg 61.3 kg    Intake/Output:   Intake/Output Summary (Last 24 hours) at 08/13/2024 0718 Last data filed at 08/13/2024 0300 Gross per 24 hour  Intake 1919.44  ml  Output 175 ml  Net 1744.44 ml     Physical Exam   General:  elderly, chronically ill appearing.  No respiratory difficulty Neck: JVD ~10/11 cm.  Cor: Regular rate & rhythm. No murmurs. Lungs: clear, exp wheezes Extremities: +1 BLE edema  Neuro: alert & oriented x 3. Affect pleasant.   Telemetry   NSR 70s (Personally reviewed)    Labs   CBC Recent Labs    08/12/24 0229 08/13/24 0532  WBC 18.0* 12.9*  HGB 7.7* 7.2*  HCT 23.3* 21.3*  MCV 94.0 93.4  PLT 365 323   Basic Metabolic Panel Recent Labs    98/88/73 0500 08/12/24 0229 08/13/24 0532  NA 128* 128* 127*  K 3.9 4.3 3.9  CL 89* 89* 89*  CO2 31 32 31  GLUCOSE 101* 109* 103*  BUN 34* 35* 33*  CREATININE 1.40* 1.35* 1.29*  CALCIUM  9.6 9.3 9.3  MG 1.9 1.9  --    Liver Function Tests No results for input(s): AST, ALT, ALKPHOS, BILITOT, PROT, ALBUMIN  in the last 72 hours.  No results for input(s): LIPASE, AMYLASE in the last 72 hours. Cardiac Enzymes No results for input(s): CKTOTAL, CKMB, CKMBINDEX, TROPONINI in the last 72 hours.  BNP: BNP (last 3 results) Recent Labs    11/22/23 1129 03/26/24 1423 05/29/24 1206  BNP 472.0* 614.1* 841.1*    ProBNP (last 3 results) Recent Labs    07/24/24 1016 08/02/24 1551 08/09/24  1626  PROBNP 4,537.0* 5,039.0* 8,218.0*     D-Dimer No results for input(s): DDIMER in the last 72 hours. Hemoglobin A1C No results for input(s): HGBA1C in the last 72 hours. Fasting Lipid Panel No results for input(s): CHOL, HDL, LDLCALC, TRIG, CHOLHDL, LDLDIRECT in the last 72 hours. Medications:   Scheduled Medications:  amiodarone   200 mg Oral BID   arformoterol   15 mcg Nebulization BID   aspirin  EC  81 mg Oral Q0600   atorvastatin   80 mg Oral Daily   Chlorhexidine  Gluconate Cloth  6 each Topical Daily   collagenase    Topical Daily   feeding supplement  237 mL Oral BID BM   insulin  aspart  0-6 Units Subcutaneous TID WC    ipratropium-albuterol   3 mL Nebulization BID   levothyroxine   37.5 mcg Oral Q0600   mirtazapine   7.5 mg Oral QHS   mouth rinse  15 mL Mouth Rinse 4 times per day   ranolazine   1,000 mg Oral BID   revefenacin   175 mcg Nebulization Daily   sodium chloride  flush  10-40 mL Intracatheter Q12H   sodium chloride  flush  3 mL Intravenous Q12H   valACYclovir   1,000 mg Oral Daily    Infusions:  ceFEPime  (MAXIPIME ) IV 2 g (08/12/24 2246)   heparin  1,100 Units/hr (08/12/24 1444)    PRN Medications: acetaminophen , albuterol , calcium  carbonate, guaiFENesin -dextromethorphan, HYDROmorphone  (DILAUDID ) injection, iohexol , midodrine , ondansetron  (ZOFRAN ) IV, mouth rinse, oxyCODONE -acetaminophen , phenol, polyethylene glycol, senna, sodium chloride  flush, sodium chloride  flush  Assessment/Plan   1. Acute hypoxemic respiratory failure: Multifactorial.  Patient is a prior smoker with h/o COPD but has not been on home oxygen. Admit CXR with bilateral lower lobe infiltrates, but procalcitonin has not been elevated.  Respiratory virus panel negative.  Concern for COPD exacerbation.  Volume overload also thought to contribute.  CTA chest on 1/8 with no PE, airspace and interstitial infiltrates throughout lungs, large bilateral effusions, emphysema.  She was on Solumedrol earlier in hospitalization, now off.  Down to 3L [place].  Klebsiella PNA on sputum culture.   - CVP 12/13. Increase Lasix  80 mg IV bid today.   - Cefepime  for Klebsiella.  2. Acute on chronic systolic CHF: Ischemic cardiomyopathy, has MDT ICD.  Echo this admission with EF 30-35%, WMAs, normal RV, IVC not dilated.   - CVP 12/13 today.  BP low (90s), possible septic physiology with Klebsiella PNA.  - Lasix  80 mg IV bid and follow response. Husband reports brisk UOP while foley was in place, will bladder scan. Strict I&O.  - Holding Entresto  and spiro for now.  - No SLGT2 inhibitor with h/o urinary incontinence.  3. CAD: NSTEMI 12/17, CTO LAD with  collaterals and severe pLCx/OM1 stenosis on cath.  She had PCI to pLCx and OM1.  No chest pain.  - Continue ASA 81 - Continue statin.  - Continue ranolazine  4. Hyponatremia: This appears chronic, Na in upper 120s -130 range generally, has been seen by endocrinology in the past.  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  ?Baseline SIADH with additional component of hypervolemic hyponatremia this admission vs worsening SIADH due to COPD exacerbation/lung infection. Tolvaptan  given 1/6. Na 127 today.  - Fluid restrict.   - Continue diuresis.   5. PAD: S/p R sided axillobifemoral bypass using PTFE and R CFA endarterectomy this admission. Has wound vac present still.  - ASA - Statin - VVS following.  6. Post-op anemia/ IDA: Hgb 7.2 today, no overt bleeding.   7. VT: Multiple runs with ICD  shocks in setting of low K and Mg and milrinone  initiation.  Now off milrinone . No further VT. K and Mg now replete  - po amiodarone  200 bid.  8. RUE DVT: Continue heparin  gtt.   Beckey LITTIE Coe 08/13/2024 7:18 AM  Patient seen with NP, I formulated the plan and agree with the above note.   CVP 12 on my read today.  Still short of breath.  Now on cefepime  for Klebsiella PNA.  Creatinine stable 1.29.  SBP 90s, getting BP readings from leg.   General: NAD, frail.  Neck: JVP 10-12 cm, no thyromegaly or thyroid  nodule.  Lungs: Rhonchi bilaterally.  CV: Nondisplaced PMI.  Heart regular S1/S2, no S3/S4, no murmur.  Trace ankle edema.  Abdomen: Soft, nontender, no hepatosplenomegaly, no distention.  Skin: Intact without lesions or rashes.  Neurologic: Alert and oriented x 3.  Psych: Normal affect. Extremities: No clubbing or cyanosis. LUE swelling.  HEENT: Normal.   CVP 12 and still short of breath.  I will give Lasix  80 mg IV x 1 and put her back on Lasix  gtt 10 mg/hr.  She did well in the past on this regimen.   Continue cefepime  for Klebsiella PNA.   Hgb 7.2, no overt bleeding. She will be on Lasix  gtt today so will  give 1 unit PRBCs.  She will continue heparin  gtt for now with new RUE DVT.   Mobilize.   Ezra Shuck 08/13/2024 9:46 AM

## 2024-08-14 DIAGNOSIS — I5023 Acute on chronic systolic (congestive) heart failure: Secondary | ICD-10-CM | POA: Diagnosis not present

## 2024-08-14 LAB — TYPE AND SCREEN
ABO/RH(D): A POS
Antibody Screen: NEGATIVE
Unit division: 0

## 2024-08-14 LAB — BPAM RBC
Blood Product Expiration Date: 202601262359
ISSUE DATE / TIME: 202601131215
Unit Type and Rh: 6200

## 2024-08-14 MED ORDER — MORPHINE SULFATE (PF) 2 MG/ML IV SOLN: 1.0000 mg | INTRAVENOUS | Status: AC | PRN

## 2024-08-14 MED ORDER — LORAZEPAM 2 MG/ML PO CONC: 1.0000 mg | ORAL | Status: AC | PRN

## 2024-08-14 NOTE — OR Nursing (Signed)
Comfort care measures continue.

## 2024-08-14 NOTE — TOC Transition Note (Deleted)
 Transition of Care [facility]) - Transition note   Patient Details  Name: Pam [name] MRN: [id number] Date of Birth: February 21, 1951  Transition of Care [facility]) CM/SW Contact:  Arlana JINNY Nicholaus ISRA[place] Phone Number: [phone] 08/14/2024, 1:37 PM   Clinical Narrative:   [facility] CSW called to schedule patient hospital follow up appointment for 08/20/24 at 2:00 PM.   Appointment cancelled since Hospice is following.          Patient Goals and CMS Choice            Discharge Placement                       Discharge Plan and Services Additional resources added to the After Visit Summary for                                       Social Drivers of Health (SDOH) Interventions SDOH Screenings   Food Insecurity: No Food Insecurity (07/25/2024)  Housing: Unknown (07/25/2024)  Transportation Needs: No Transportation Needs (07/25/2024)  Utilities: Not At Risk (07/25/2024)  Alcohol  Screen: Low Risk (04/29/2022)  Financial Resource Strain: Low Risk (04/29/2022)  Social Connections: Unknown (07/30/2024)  Tobacco Use: Medium Risk (07/29/2024)     Readmission Risk Interventions     No data to display

## 2024-08-14 NOTE — Progress Notes (Signed)
" ° °  Inpatient Rehabilitation Admissions Coordinator   Noted plans for home hospice. We will sign off.  Heron Leavell, RN, MSN Rehab Admissions Coordinator [phone] 08/14/2024 8:15 AM  "

## 2024-08-14 NOTE — TOC Transition Note (Addendum)
 Transition of Care [facility]) - Discharge Note   Patient Details  Name: Pam [name] MRN: [id number] Date of Birth: 24-Jun-1951  Transition [facility]) CM/SW Contact:  Luise JAYSON Pan, LCSWA Phone Number: 08/14/2024, 4:03 PM   Clinical Narrative:   Patient will DC to: [facility] place Anticipated DC date: 08/14/2024  Family notified: Ditton,Nickolas Spouse, Emergency Contact [phone]   Transport by: [facility]   Per MD patient ready for DC to [facility]. RN, patient, patient's family, and facility notified of DC. Discharge Summary and FL2 sent to facility. DC packet on chart. Ambulance transport requested for patient 4:03 PM.   4:12 PM Per [facility] request, CSW called [facility] to inform patient will need to be transported to [facility] after 8:30pm.  CSW will sign off for now as social work intervention is no longer needed. Please consult us  again if new needs arise.      Final next level of care: Hospice Medical Facility Barriers to Discharge: Barriers Resolved   Patient Goals and CMS Choice            Discharge Placement              Patient chooses bed at: Other - please specify in the comment section below: [facility]) Patient to be transferred to facility by: [facility] Name of family member notified: Gaylord,Nickolas  Spouse, Emergency Contact  [phone] Patient and family notified of of transfer: 08/14/24  Discharge Plan and Services Additional resources added to the After Visit Summary for                                       Social Drivers of Health (SDOH) Interventions SDOH Screenings   Food Insecurity: No Food Insecurity (07/25/2024)  Housing: Unknown (07/25/2024)  Transportation Needs: No Transportation Needs (07/25/2024)  Utilities: Not At Risk (07/25/2024)  Alcohol  Screen: Low Risk (04/29/2022)  Financial Resource Strain: Low Risk (04/29/2022)  Social Connections: Unknown (07/30/2024)  Tobacco Use: Medium Risk (07/29/2024)     Readmission  Risk Interventions     No data to display

## 2024-08-14 NOTE — Progress Notes (Signed)
 Patient on high flow nasal cannula this morning  Prevena vacuum dressing no longer on, no seal.  Had a small amount of sanguinous output in the canister.  When taken down, there was no bleeding.  Dry dressings placed.  These can be changed as needed.  I told Pam [name] that I would be praying for her. She is comfort care  No plans to return to the operating room.  Will continue to monitor the left groin.

## 2024-08-14 NOTE — Progress Notes (Signed)
 Pam [name] [facility]  Hospice hospital liaison note   Referral received from [facility] for family interest in [facility].   Met with patient and family in room to explain services and hospice philosophy and all questions answered.  [facility] is able to accept patient this afternoon once consents are complete.    RN staff, you may call report at any time to [phone], room is assigned when report is called.  Please leave IV intact and send completed DNR with patient.   Updated attending and [facility] manager via [employer].  Thank you for the opportunity to participate in this patient's care  Amy Darien BSN, RN [facility] Liaison [phone]

## 2024-08-14 NOTE — Progress Notes (Signed)
 Patient is being discharged to [facility], report called to [facility], [facility]. The patient and her husband are aware the plan is for transport after 8:30 pm

## 2024-08-14 NOTE — Progress Notes (Signed)
 Comfort care orders in place, patient is alert and oriented and requests that ICD not be turned off until her husband arrives

## 2024-08-14 NOTE — Progress Notes (Addendum)
 Patient ID: Pam [name], female   DOB: Aug 26, 1950, 74 y.o.   MRN: [id number]     Advanced Heart Failure Rounding Note  Cardiologist: Lonni Cash, MD  [facility] Cardiologist: Establishing Dr. Rolan  Patient Profile  Pam [name] is a 74 y.o. female with  history of chronic HFrEF/mixed cardiomyopathy, CAD, PAD, chronic hyponatremia, COPD.    Admitted with acute respiratory failure, acute on chronic CHF and hyponatremia. Significant Events:   1/5: started on Milrinone  for low co-ox, 53% 1/5: moved to CCU for acute hypoxic respiratory failure requiring Bipap, ?seizure like activity. CXR w/ pulm edema. Runs of VT treated w/ ICD shocks, milrinone  d/ced  (K 3.2, Mg 1.6) 1/7: RHC showed normal filling pressures, mild pulmonary hypertension, normal CO, good PAPi 1/8: CTA chest with no PE, airspace and interstitial infiltrates throughout lungs, large bilateral effusions, emphysema.  1/9: RUE US : DVT noted.  1/11: Sputum Cx + to Klebsiella, resistant to zosyn . Switched to cefepime   Subjective:    Pt now electing for home hospice. Made DNR. Comfort measures only but wishes to keep ICD on until she is at home. Referral has been placed to [facility].    Getting PRN comfort meds.    Objective:   Weight Range: 61.3 kg Body mass index is 23.93 kg/m.   Vital Signs:   Temp:  [96.7 F (35.9 C)-98.8 F (37.1 C)] 98.8 F (37.1 C) (01/13 1817) Pulse Rate:  [59-103] 82 (01/13 1817) Resp:  [12-34] 19 (01/13 1817) BP: (91-148)/(34-79) 148/79 (01/13 1817) SpO2:  [59 %-100 %] 100 % (01/14 0611) FiO2 (%):  [100 %] 100 % (01/13 2008) Last BM Date : 08/12/24  Weight change: Filed Weights   08/11/24 0123 08/12/24 0500 08/13/24 0226  Weight: 61.5 kg 61.1 kg 61.3 kg    Intake/Output:   Intake/Output Summary (Last 24 hours) at 08/14/2024 0753 Last data filed at 08/14/2024 0600 Gross per 24 hour  Intake 1471.88 ml  Output 0 ml  Net 1471.88 ml     Physical Exam   GENERAL: weak,  frail/cachetic appearing, NAD  Lungs- diminished at bases b/l  CARDIAC:  JVP elevated to jaw          Regular rhythm, tachy rate ABDOMEN: Soft, non-tender, non-distended.  EXTREMITIES: cold distal ext, 1+ b/l pedal edema  NEUROLOGIC: No obvious FND   Telemetry   N/A (comfort)   Labs   CBC Recent Labs    08/12/24 0229 08/13/24 0532  WBC 18.0* 12.9*  HGB 7.7* 7.2*  HCT 23.3* 21.3*  MCV 94.0 93.4  PLT 365 323   Basic Metabolic Panel Recent Labs    98/87/73 0229 08/13/24 0532  NA 128* 127*  K 4.3 3.9  CL 89* 89*  CO2 32 31  GLUCOSE 109* 103*  BUN 35* 33*  CREATININE 1.35* 1.29*  CALCIUM  9.3 9.3  MG 1.9  --    Liver Function Tests No results for input(s): AST, ALT, ALKPHOS, BILITOT, PROT, ALBUMIN  in the last 72 hours.  No results for input(s): LIPASE, AMYLASE in the last 72 hours. Cardiac Enzymes No results for input(s): CKTOTAL, CKMB, CKMBINDEX, TROPONINI in the last 72 hours.  BNP: BNP (last 3 results) Recent Labs    11/22/23 1129 03/26/24 1423 05/29/24 1206  BNP 472.0* 614.1* 841.1*    ProBNP (last 3 results) Recent Labs    07/24/24 1016 08/02/24 1551 08/09/24 1626  PROBNP 4,537.0* 5,039.0* 8,218.0*     D-Dimer No results for input(s): DDIMER in the last 72 hours. Hemoglobin A1C  No results for input(s): HGBA1C in the last 72 hours. Fasting Lipid Panel No results for input(s): CHOL, HDL, LDLCALC, TRIG, CHOLHDL, LDLDIRECT in the last 72 hours. Medications:   Scheduled Medications:  Chlorhexidine  Gluconate Cloth  6 each Topical Daily   mouth rinse  15 mL Mouth Rinse 4 times per day   sodium chloride  flush  10-40 mL Intracatheter Q12H   sodium chloride  flush  3 mL Intravenous Q12H    Infusions:    PRN Medications: acetaminophen  **OR** acetaminophen , antiseptic oral rinse, artificial tears, glycopyrrolate  **OR** glycopyrrolate  **OR** glycopyrrolate , haloperidol  **OR** haloperidol  **OR** haloperidol   lactate, LORazepam  **OR** LORazepam  **OR** LORazepam , morphine  injection, ondansetron  **OR** ondansetron  (ZOFRAN ) IV, mouth rinse, phenol, sodium chloride  flush, sodium chloride  flush  Assessment/Plan   1. Acute hypoxemic respiratory failure: Multifactorial.  Patient is a prior smoker with h/o COPD but has not been on home oxygen. Admit CXR with bilateral lower lobe infiltrates, but procalcitonin has not been elevated.  Respiratory virus panel negative.  Concern for COPD exacerbation.  Volume overload also thought to contribute.  CTA chest on 1/8 with no PE, airspace and interstitial infiltrates throughout lungs, large bilateral effusions, emphysema.  She was on Solumedrol earlier in hospitalization, now off. Klebsiella PNA on sputum culture, resistant to zosyn . Abx switched to cefepime . Pt now comfort care. All abx stopped.  - continue supp O2 for comfort  2. Acute on chronic systolic CHF: Ischemic cardiomyopathy, has MDT ICD.  Echo this admission with EF 30-35%, WMAs, normal RV, IVC not dilated.  Possible septic physiology with Klebsiella PNA.  - per above, comfort measures only. Can use IV Lasix  PRN  3. CAD: NSTEMI 12/17, CTO LAD with collaterals and severe pLCx/OM1 stenosis on cath.  She had PCI to pLCx and OM1.  No chest pain.  -  ASA, statin and ranolazine  discontinued 4. Hyponatremia: This appears chronic, Na in upper 120s -130 range generally, has been seen by endocrinology in the past.  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  ?Baseline SIADH with additional component of hypervolemic hyponatremia this admission vs worsening SIADH due to COPD exacerbation/lung infection. Tolvaptan  given 1/6.  - now comfort, discontinued lab draws - fluid restrictions lifted  5. PAD: S/p R sided axillobifemoral bypass using PTFE and R CFA endarterectomy this admission. Has wound vac present still.   6. Post-op anemia/ IDA: no overt bleeding.   7. VT: Multiple runs with ICD shocks in setting of low K and Mg and milrinone   initiation.  Now off milrinone . No further VT.  - PCM has discussed turning off patient's ICD to prevent it from firing and causing discomfort during the dying process. Patient prefers to do this once she is at home w/ hospice support. 8. RUE DVT: initially on heparin  gtt, d/c given comfort measures   Plan is home w/ hospice support. PCM assisting w/ finding home agency. Appreciate their assistance.   Pam Shed, PA-C  08/14/2024 7:53 AM  Patient seen with PA, I formulated the plan and agree with the above note.   Patient developed hemoptysis last night and worsening respiratory distress.  Plan made to move to comfort care.  She is now off her cardiac meds, no labs being drawn.  Goal to get home with hospice.   General: Frail, thin Neck: No JVD, no thyromegaly or thyroid  nodule.  Lungs: Rhonchi bilaterally.  CV: Nondisplaced PMI.  Heart regular S1/S2, no S3/S4, no murmur.  No peripheral edema.   Abdomen: Soft, nontender, no hepatosplenomegaly, no distention.  Skin: Intact without lesions  or rashes.  Neurologic: Alert and oriented x 3.  Psych: Normal affect. Extremities: No clubbing or cyanosis.  HEENT: Normal.   Comfort care, goal is home with hospice.  She wants to keep ICD tachy therapies on until she gets home.   Cardiology to sign off.   Pam [name] 08/14/2024 8:22 AM

## 2024-08-14 NOTE — Discharge Summary (Signed)
 Physician Discharge Summary  Pam [name] FMW:[id number] DOB: 22-Feb-1951 DOA: 07/24/2024  PCP: Andrew Truman GRADE., MD  Admit date: 07/24/2024 Discharge date: 08/14/2024  Time spent: 45 minutes  Recommendations for Outpatient Follow-up:  Residential hospice for end-of-life care   Discharge Diagnoses:  Acute hypoxic respiratory failure Acute on chronic systolic CHF CAD Ventricular tachycardia Lactic acidosis   Elevated troponin   Hyponatremia   Critical limb ischemia of right lower extremity (HCC)   COPD (chronic obstructive pulmonary disease) (HCC)   Essential hypertension   Hypothyroidism   Pressure injury of skin, POA   Limb ischemia   Malnutrition of moderate degree   Discharge Condition: Poor  Diet recommendation: Comfort feeds  Filed Weights   08/11/24 0123 08/12/24 0500 08/13/24 0226  Weight: 61.5 kg 61.1 kg 61.3 kg    History of present illness:  74 y.o. 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  Admitted to ICU with respiratory failure complicated by fluid overload, hypotension and respiratory insufficiency.  On 1/5 evening hrs had 4 episodes of VT triggering ICD shocks. Administered additional amiodarone  bolus. Milrinone  stopped. K 3.2 , Mg 1.6. has chronic LAD. W/ mild ST elevation. During events there was change in mental status with concern for possible seizure however this was felt primarily to be syncopal event associated with the VT.  Transferred to [facility] on 1/8. 1/9-hemoptysis-PCCM re-consulted 1/9-LUE DVT 1/10- heparin  started 1/12-palliative care consulted after discussion with family, patient made DNR 1/13-worsening hypoxia, hemoptysis, now comfort care   Hospital Course:   Acute hypoxic respiratory failure  - Combination of healthcare associated pneumonia, COPD exacerbation and CHF - Completed antibiotic course, diuresed, treated with pulmonary toilet - Finished Zosyn  (sputum culture  showing Pseudomonas)-repeat culture grew Klebsiella, switched to cefepime  -Continue to decline with worsening respiratory distress, hemoptysis, seen by pulmonary critical care again, and CHF team, palliative care was consulted this admission, transitioned to comfort care -Plan for residential hospice for end-of-life care   Acute on chronic systolic heart failure.  Baseline EF 30 to 35%, current EF  remains the same.  There is left ventricular regional wall abnormalities RV size moderately enlarged with moderately enlarged pulmonary artery systolic pressures -Now comfort care    UE duplex: positive for DVT in the left axillary vein and positive for an SVT in the cephalic vein.    Acute on Chronic Anemia etiology unclear   History of known coronary artery disease with chronically occluded LAD.  Currently with ST elevation in V1 and V2, with elevated troponin.  Troponin is flat.  Likely demand ischemia.   -Defer to cardiology   VT.  Status post ICD shock x 4 amiodarone  infusion changed to p.o. Now comfort care   Lactic acidosis. -resolved   Brief change in mental status-delirium CT head shows nothing acute.  EEG shows no seizures. -Appears to be back to baseline   Acute on chronic hyponatremia.  Suspect secondary to heart failure. Status post tolvaptan  on 08/06/2024. Daily labs    Hypokalemia, hypochloremia, Hypomagnesemia. -replete   Hypothyroidism Continuing Synthroid    Critical limb ischemia status post right sided axillobifemoral bypass with right common femoral endarterectomy on 12/29 Mild bleeding for heparin  shot from left arm     Pressure ulcers Wound 07/24/24 1830 Pressure Injury Sacrum Mid Stage 3 -  Full thickness tissue loss. Subcutaneous fat may be visible but bone, tendon or muscle are NOT exposed. (Active)     Wound 08/05/24 Pressure Injury Buttocks  Right;Left Stage 3 -  Full thickness tissue loss. Subcutaneous fat may be visible but bone, tendon or muscle are NOT  exposed. (Active)     Discharge Exam: Vitals:   08/14/24 0611 08/14/24 0912  BP:    Pulse:    Resp:  (!) 24  Temp:    SpO2: 100%    Gen: Awake, Alert, Oriented X 2, frail cachectic HEENT: no JVD Lungs: Scattered rhonchi CVS: S1S2/RRR Abd: soft, Non tender, non distended, BS present Extremities: No edema Skin: no new rashes on exposed skin   Discharge Instructions    Allergies as of 08/14/2024       Reactions   Lisinopril  Cough   Dry cough        Medication List     STOP taking these medications    acetaminophen  500 MG tablet Commonly known as: TYLENOL    albuterol  108 (90 Base) MCG/ACT inhaler Commonly known as: VENTOLIN  HFA   aspirin  81 MG chewable tablet   atorvastatin  80 MG tablet Commonly known as: LIPITOR    bisoprolol  5 MG tablet Commonly known as: ZEBETA    CALCIUM  PO   clopidogrel  75 MG tablet Commonly known as: PLAVIX    digoxin  0.125 MG tablet Commonly known as: LANOXIN    furosemide  20 MG tablet Commonly known as: LASIX    levothyroxine  25 MCG tablet Commonly known as: SYNTHROID    nitroGLYCERIN  0.4 MG SL tablet Commonly known as: NITROSTAT    nystatin  cream Commonly known as: MYCOSTATIN    ranolazine  1000 MG SR tablet Commonly known as: RANEXA    sacubitril -valsartan  49-51 MG Commonly known as: Entresto    spironolactone  25 MG tablet Commonly known as: ALDACTONE    umeclidinium-vilanterol 62.5-25 MCG/ACT Aepb Commonly known as: Anoro Ellipta    valACYclovir  1000 MG tablet Commonly known as: VALTREX    Vitamin D3 50 MCG (2000 UT) Tabs       TAKE these medications    LORazepam  2 MG/ML concentrated solution Commonly known as: ATIVAN  Place 0.5 mLs (1 mg total) under the tongue every 4 (four) hours as needed for anxiety.   morphine  (PF) 2 MG/ML injection Inject 0.5 mLs (1 mg total) into the vein every 2 (two) hours as needed (To alleviate signs and symptoms of distress).       Allergies[1]  Follow-up Information      Zenaida Morene PARAS, MD Follow up on 08/16/2024.   Specialty: Cardiology Why: 9 am Contact information: [address] [place] [address] [phone]                  The results of significant diagnostics from this hospitalization (including imaging, microbiology, ancillary and laboratory) are listed below for reference.    Significant Diagnostic Studies: VAS US  UPPER EXTREMITY VENOUS DUPLEX Result Date: 08/10/2024 UPPER VENOUS STUDY  Patient Name:  Pam [name]  Date of Exam:   08/09/2024 Medical Rec #: [id number]    Accession #:    [id number] Date of Birth: [birth year]   Patient Gender: F Patient Age:   33 years Exam Location:  [facility] Procedure:      VAS US  UPPER EXTREMITY VENOUS DUPLEX Referring Phys: MANUELITA [facility] --------------------------------------------------------------------------------  Other Indications: Left upper extremity swelling. Comparison Study: No prior exam. Performing Technologist: Edilia Elden Appl  Examination Guidelines: A complete evaluation includes B-mode imaging, spectral Doppler, color Doppler, and power Doppler as needed of all accessible portions of each vessel. Bilateral testing is considered an integral part of a complete examination. Limited examinations for reoccurring indications may be performed as  noted.  Right Findings: +----------+------------+---------+-----------+----------+-------+ RIGHT     CompressiblePhasicitySpontaneousPropertiesSummary +----------+------------+---------+-----------+----------+-------+ IJV           Full       Yes       Yes                      +----------+------------+---------+-----------+----------+-------+ Subclavian    Full       Yes       Yes                      +----------+------------+---------+-----------+----------+-------+  Left Findings: +----------+------------+---------+-----------+----------+-----------+ LEFT      CompressiblePhasicitySpontaneousProperties   Summary   +----------+------------+---------+-----------+----------+-----------+ IJV           Full       Yes       Yes              Resistance. +----------+------------+---------+-----------+----------+-----------+ Subclavian    Full       Yes       Yes                          +----------+------------+---------+-----------+----------+-----------+ Axillary    Partial      Yes       Yes                          +----------+------------+---------+-----------+----------+-----------+ Brachial      Full       Yes       Yes                          +----------+------------+---------+-----------+----------+-----------+ Radial        Full                                              +----------+------------+---------+-----------+----------+-----------+ Ulnar         Full                                              +----------+------------+---------+-----------+----------+-----------+ Basilic       Full       Yes       Yes                          +----------+------------+---------+-----------+----------+-----------+ Deep vein thrombosis noted in the left axillary vein. Superficial vein thrombosis noted in the left cephalic vein at the antecubital fossa and in the distal forearm by the IV line.  Summary:  Right: No evidence of thrombosis in the subclavian.  Left: Findings consistent with acute deep vein thrombosis involving the left axillary vein. Findings consistent with acute superficial vein thrombosis involving the left cephalic vein.  *See table(s) above for measurements and observations.  Diagnosing physician: Fonda Rim Electronically signed by Fonda Rim on 08/10/2024 at 8:34:59 AM.    Final    DG Chest 2 View Result Date: 08/09/2024 EXAM: 2 VIEW(S) XRAY OF THE CHEST 08/09/2024 09:29:00 AM COMPARISON: 08/06/2024 CLINICAL HISTORY: Chronic bilateral pleural effusions FINDINGS: LINES, TUBES AND DEVICES: Right PICC line in place. LUNGS AND PLEURA: Bilateral  airspace disease consistent with pulmonary edema. Bilateral pleural  effusions. No pneumothorax. HEART AND MEDIASTINUM: Cardiomegaly. Left pacemaker. Calcified aorta. BONES AND SOFT TISSUES: No acute osseous abnormality. IMPRESSION: 1. Similar appearance of likely pulmonary edema with cardiomegaly and bilateral pleural effusions. Electronically signed by: Michaeline Blanch MD 08/09/2024 04:25 PM EST RP Workstation: [id number]   CT Angio Chest Pulmonary Embolism (PE) W or WO Contrast Result Date: 08/08/2024 EXAM: CTA of the Chest with contrast for PE 08/08/2024 11:22:18 PM TECHNIQUE: CTA of the chest was performed without and with the administration of 75 mL of intravenous contrast (iohexol  (OMNIPAQUE ) 350 MG/ML injection 75 mL IOHEXOL  350 MG/ML SOLN). Multiplanar reformatted images are provided for review. MIP images are provided for review. Automated exposure control, iterative reconstruction, and/or weight based adjustment of the mA/kV was utilized to reduce the radiation dose to as low as reasonably achievable. COMPARISON: Chest radiograph 08/07/2023 and CT chest 01/16/2024. CLINICAL HISTORY: Pulmonary embolism (PE) suspected, high prob; hemoptysis. Hemoptysis with increased oxygen requirement. FINDINGS: PULMONARY ARTERIES: Pulmonary arteries are adequately opacified for evaluation. No pulmonary embolism. Main pulmonary artery is normal in caliber. MEDIASTINUM: Treatment enhancement. No pericardial effusions. Calcification of the aorta and coronary arteries. No aortic aneurysm. Cardiac pacemaker. Esophagus is decompressed. The thyroid  gland is unremarkable. LYMPH NODES: No significant lymphadenopathy. LUNGS AND PLEURA: Emphysematous changes throughout the lungs. Airspace and interstitial infiltrates throughout the lungs. This could represent pneumonia, edema, or aspiration. Airways are patent. Large bilateral pleural effusions. No pneumothorax. UPPER ABDOMEN: Limited images of the upper abdomen are unremarkable. SOFT  TISSUES AND BONES: Degenerative changes in the spine. No acute bone or soft tissue abnormality. IMPRESSION: 1. No pulmonary embolism. 2. Airspace and interstitial infiltrates throughout the lungs, possibly representing pneumonia, edema, or aspiration. 3. Large bilateral pleural effusions. 4. Emphysematous changes throughout the lungs, and pulmonary emphysema is an independent risk factor for lung cancer and recommend consideration for evaluation for a low-dose CT lung cancer screening program. Electronically signed by: Elsie Gravely MD 08/08/2024 11:29 PM EST RP Workstation: [id number]   CARDIAC CATHETERIZATION Result Date: 08/07/2024 1. Filling pressures in normal range. 2. Mild pulmonary hypertension. 3. Cardiac output appears preserved. 4. Good PAPi.   CT ABDOMEN PELVIS WO CONTRAST Result Date: 08/07/2024 EXAM: CT ABDOMEN AND PELVIS WITHOUT CONTRAST 08/07/2024 06:48:00 AM TECHNIQUE: CT of the abdomen and pelvis was performed without the administration of intravenous contrast. Multiplanar reformatted images are provided for review. Automated exposure control, iterative reconstruction, and/or weight-based adjustment of the mA/kV was utilized to reduce the radiation dose to as low as reasonably achievable. COMPARISON: CTA abdomen and pelvis 07/05/2024. CLINICAL HISTORY: 74 year old female with recent right lower extremity vascular bypass due to limb ischemia, recurrent ventricular tachycardia, and respiratory distress. FINDINGS: LOWER CHEST: Partially visible cardiac pacemaker or ICD lead. Heart size remains normal. No pericardial effusion. Bilateral lung bases pleural effusion with simple fluid attenuation favoring transudate. Pleural fluid volume moderate, left greater than right, with bilateral significant lower lobe compressive atelectasis superimposed on diffuse emphysema. Early consolidation in both lower lobes. LIVER: The liver is unremarkable. GALLBLADDER AND BILE DUCTS: Gallbladder is unremarkable. No  biliary ductal dilatation. SPLEEN: No acute abnormality. PANCREAS: No acute abnormality. ADRENAL GLANDS: No acute abnormality. KIDNEYS, URETERS AND BLADDER: Renal vascular calcifications. Nephrolithiasis felt less likely. Foley catheter decompresses the urinary bladder. No stones in the kidneys or ureters. No hydronephrosis. No perinephric or periureteral stranding. Urinary bladder is unremarkable. GI AND BOWEL: Redundant large bowel containing gas and stool similar to the CTA 07/05/2024. Fluid containing but nondilated small bowel loops. Mostly decompressed stomach. There  is no bowel obstruction. PERITONEUM AND RETROPERITONEUM: No free fluid identified in the abdomen or pelvis. No free air. VASCULATURE: Aorta is normal in caliber. Severe diffuse calcified atherosclerosis, including subtotal occlusion of the proximal abdominal aorta on CTA 07/05/2024 (series 3 image 21 now), and vascular patency is not evaluated in the absence of IV contrast. New elongated right lateral body wall vascular bypass graft which bifurcates inferior to the umbilicus and communicates with the bilateral femoral arteries. Graft patency not evaluated in the absence of IV contrast. LYMPH NODES: No lymphadenopathy. REPRODUCTIVE ORGANS: Incidental calcified pelvic phleboliths. No acute abnormality. BONES AND SOFT TISSUES: Osteopenia with stable lumbar spine degenerative change. Largely new from 07/05/2024 moderate to severe dependent body wall fluid or edema (series 3 image 40), mildly asymmetric greater on the left. No obvious body wall hematoma. Along the bypass graft no obvious hematoma on this non-contrast exam except for possible small oval subcutaneous collection superficial to the right femoral artery bifurcation on series 3 image 79, medial to skin staples there and encompassing 23 x 14 x 28 mm (AP x transverse x CC). Estimated volume 5 mL. No superficial soft tissue gas. No acute osseous abnormality. No focal soft tissue abnormality.  IMPRESSION: 1. Moderate to severe mostly dependent body wall swelling/edema, but favor Anasarca (see #2), and no discrete body wall or post-operative hematoma except for a small and inconsequential appearing possible hematoma collection at the right groin (2.8 cm, estimated 5 mL). 2. Moderate bilateral layering pleural effusions with compressive lower lobe atelectasis, early consolidation. Underlying emphysema 3. Very Severe calcified atherosclerosis, and new long segment right chest through bilateral lower abdominal wall vascular bypass grafts. Patency not evaluated in the absence of IV contrast. Electronically signed by: Helayne Hurst MD 08/07/2024 07:23 AM EST RP Workstation: [id number]   EEG adult Result Date: 08/06/2024 Shelton Arlin KIDD, MD     08/06/2024 10:24 AM Patient Name: Pam [name] MRN: [id number] Epilepsy Attending: Arlin KIDD Shelton Referring Physician/Provider: Khaliqdina, Salman, MD Date: 08/06/2024 Duration: 23.09 mins Patient history: 74 year old female with 2 brief episodes of stiffening.  EEG to evaluate for seizure. Level of alertness: Awake, asleep AEDs during EEG study: None Technical aspects: This EEG study was done with scalp electrodes positioned according to the 10-20 International system of electrode placement. Electrical activity was reviewed with band pass filter of 1-70Hz , sensitivity of 7 uV/mm, display speed of 28mm/sec with a 60Hz  notched filter applied as appropriate. EEG data were recorded continuously and digitally stored.  Video monitoring was available and reviewed as appropriate. Description: The posterior dominant rhythm consists of 8-9 Hz activity of moderate voltage (25-35 uV) seen predominantly in posterior head regions, symmetric and reactive to eye opening and eye closing. Sleep was characterized by vertex waves, sleep spindles (12 to 14 Hz), maximal frontocentral region.  Hyperventilation and photic stimulation were not performed.   Of note, study was technically difficult  due to significant myogenic artifact. IMPRESSION: This technically difficult study is within normal limits. No seizures or epileptiform discharges were seen throughout the recording. A normal interictal EEG does not exclude the diagnosis of epilepsy. Arlin KIDD Shelton   DG Chest Port 1 View Result Date: 08/06/2024 CLINICAL DATA:  Respiratory failure. EXAM: PORTABLE CHEST 1 VIEW COMPARISON:  08/05/2024 FINDINGS: Single lead left-sided pacemaker unchanged. Right-sided PICC line unchanged. Lungs are adequately inflated demonstrate persistent mixed interstitial airspace process over the mid to lower lungs with small bilateral pleural effusions without significant change. Findings likely reflect infection and less likely edema. Cardiomediastinal  silhouette and remainder of the exam is unchanged. IMPRESSION: Persistent mixed interstitial airspace process over the mid to lower lungs with small bilateral pleural effusions without significant change. Findings likely reflect infection and less likely edema. Electronically Signed   By: Toribio Agreste M.D.   On: 08/06/2024 08:00   CT HEAD WO CONTRAST ( ) Result Date: 08/06/2024 EXAM: CT HEAD WITHOUT 08/05/2024 11:56:02 PM TECHNIQUE: CT of the head was performed without the administration of intravenous contrast. Automated exposure control, iterative reconstruction, and/or weight based adjustment of the mA/kV was utilized to reduce the radiation dose to as low as reasonably achievable. COMPARISON: CT head 07/07/2016. CLINICAL HISTORY: 74 year old female with altered mental status, seizure. FINDINGS: BRAIN AND VENTRICLES: Brain volume is normal for age. No acute intracranial hemorrhage. No mass effect or midline shift. No extra-axial fluid collection. No evidence of acute infarct. No hydrocephalus. Gray white differentiation is within normal limits for age. No suspicious intracranial vascular hyperdensity. Severe calcified atherosclerosis at the skull base. ORBITS: No acute  abnormality. No gaze deviation. SINUSES AND MASTOIDS: Visible paranasal sinuses, tympanic cavities and mastoids are well aerated. SOFT TISSUES AND SKULL: No acute skull fracture. No acute soft tissue abnormality. IMPRESSION: 1. Normal for age non-contrast CT appearance of the brain. 2. Advanced calcified atherosclerosis at the skull base. Electronically signed by: Helayne Hurst MD 08/06/2024 05:58 AM EST RP Workstation: [id number]   DG Chest Port 1 View Result Date: 08/05/2024 EXAM: 1 VIEW(S) XRAY OF THE CHEST 08/05/2024 08:51:00 PM COMPARISON: 08/04/2024 CLINICAL HISTORY: SOB (shortness of breath) FINDINGS: LINES, TUBES AND DEVICES: Right PICC line stable in place. Single lead left implanted cardiac device stable in place. LUNGS AND PLEURA: Chronic interstitial lung disease. Diffuse bilateral airspace opacities, slightly worsened from prior. Small bilateral pleural effusions. No pneumothorax. HEART AND MEDIASTINUM: Aortic atherosclerosis. Single lead left implanted cardiac device stable in place. BONES AND SOFT TISSUES: No acute osseous abnormality. IMPRESSION: 1. Diffuse bilateral airspace opacities, slightly worsened from prior. 2. Small bilateral pleural effusions. 3. Chronic interstitial lung disease. Electronically signed by: Elsie Gravely MD 08/05/2024 08:52 PM EST RP Workstation: [id number]   DG CHEST PORT 1 VIEW Result Date: 08/04/2024 EXAM: 1 VIEW(S) XRAY OF THE CHEST 08/04/2024 09:49:00 AM COMPARISON: 08/02/2024 CLINICAL HISTORY: Dyspnea FINDINGS: LINES, TUBES AND DEVICES: Right PICC line with the tip overlying the expected region of the superior vena cava. Left single lead cardiac defibrillator in place. LUNGS AND PLEURA: Emphysema with chronic coarsened interstitial markings. Increased bibasilar patchy airspace opacities. Grossly stable bilateral pleural effusions. No pneumothorax. HEART AND MEDIASTINUM: Aortic atherosclerosis. BONES AND SOFT TISSUES: No acute osseous abnormality. IMPRESSION: 1.  Increased bibasilar patchy airspace opacities. 2. Grossly stable bilateral pleural effusions. Electronically signed by: Morgane Naveau MD 08/04/2024 10:13 AM EST RP Workstation: [id number]   DG Chest 2 View Result Date: 08/02/2024 CLINICAL DATA:  Pleural effusion.  Shortness of breath. EXAM: CHEST - 2 VIEW COMPARISON:  07/24/2024 FINDINGS: Left-sided pacemaker in place. The heart is normal in size. Emphysema with diffuse interstitial coarsening. Small pleural effusions with patchy bibasilar airspace disease, left greater than right, slight worsening. No pneumothorax. IMPRESSION: 1. Small pleural effusions with patchy bibasilar airspace disease, left greater than right, slight worsening from prior. 2. Emphysema with chronic interstitial coarsening. Electronically Signed   By: Andrea Gasman M.D.   On: 08/02/2024 15:38   US  EKG SITE RITE Result Date: 08/02/2024 If Site Rite image not attached, placement could not be confirmed due to current cardiac rhythm.  ECHOCARDIOGRAM COMPLETE Result Date: 08/01/2024  ECHOCARDIOGRAM REPORT   Patient Name:   Pam [name] Date of Exam: 08/01/2024 Medical Rec #:  [id number]   Height:       63.0 in Accession #:    [id number]  Weight:       126.0 lb Date of Birth:  March 21, 1951  BSA:          1.589 m Patient Age:    73 years    BP:           120/53 mmHg Patient Gender: F           HR:           69 bpm. Exam Location:  Inpatient Procedure: 2D Echo, Cardiac Doppler, Color Doppler and Intracardiac            Opacification Agent (Both Spectral and Color Flow Doppler were            utilized during procedure). Indications:    CHF  History:        Patient has prior history of Echocardiogram examinations, most                 recent 07/02/2024. CHF, CAD, COPD; Risk Factors:Hypertension.  Sonographer:    Philomena Daring Referring Phys: COSTIN M GHERGHE IMPRESSIONS  1. Left ventricular ejection fraction, by estimation, is 30 to 35%. The left ventricle has moderately decreased function. The  left ventricle demonstrates regional wall motion abnormalities (see scoring diagram/findings for description). Left ventricular  diastolic parameters are indeterminate. Elevated left atrial pressure.  2. Right ventricular systolic function is normal. The right ventricular size is moderately enlarged. There is mildly elevated pulmonary artery systolic pressure.  3. Left atrial size was moderately dilated.  4. Right atrial size was moderately dilated.  5. Large pleural effusion.  6. The mitral valve is degenerative. Trivial mitral valve regurgitation. No evidence of mitral stenosis. Moderate mitral annular calcification.  7. The aortic valve is abnormal. Aortic valve regurgitation is not visualized. Aortic valve sclerosis/calcification is present, without any evidence of aortic stenosis.  8. The inferior vena cava is normal in size with greater than 50% respiratory variability, suggesting right atrial pressure of 3 mmHg. Comparison(s): A prior study was performed on 07/02/24. Prior images reviewed side by side. Left ventricular ejection fraction and wall motion abnormalities are slightly worse on today's study. FINDINGS  Left Ventricle: Contrast swirling in the apex, cannot rule out a small spical thrombus. Left ventricular ejection fraction, by estimation, is 30 to 35%. The left ventricle has moderately decreased function. The left ventricle demonstrates regional wall motion abnormalities. Definity  contrast agent was given IV to delineate the left ventricular endocardial borders. The left ventricular internal cavity size was normal in size. There is no left ventricular hypertrophy. Left ventricular diastolic function could not be evaluated due to paced rhythm. Left ventricular diastolic parameters are indeterminate. Elevated left atrial pressure.  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 The  inferior wall, posterior wall, basal anteroseptal segment, basal anterolateral segment, basal anterior segment, and basal inferoseptal segment are hypokinetic. Right Ventricle: The right ventricular size is moderately enlarged. No increase in right ventricular wall thickness. Right ventricular systolic function is normal. There is mildly elevated pulmonary artery systolic pressure. The tricuspid regurgitant velocity is 2.99 m/s, and with an assumed right atrial pressure of 3 mmHg, the estimated right ventricular systolic pressure is 38.8 mmHg. Left Atrium: Left atrial  size was moderately dilated. Right Atrium: Right atrial size was moderately dilated. Pericardium: Trivial pericardial effusion is present. The pericardial effusion is posterior to the left ventricle. Mitral Valve: The mitral valve is degenerative in appearance. There is moderate thickening of the mitral valve leaflet(s). Moderate mitral annular calcification. Trivial mitral valve regurgitation. No evidence of mitral valve stenosis. Tricuspid Valve: The tricuspid valve is normal in structure. Tricuspid valve regurgitation is mild . No evidence of tricuspid stenosis. Aortic Valve: The aortic valve is abnormal. Aortic valve regurgitation is not visualized. Aortic valve sclerosis/calcification is present, without any evidence of aortic stenosis. Pulmonic Valve: The pulmonic valve was normal in structure. Pulmonic valve regurgitation is not visualized. No evidence of pulmonic stenosis. Aorta: The aortic root is normal in size and structure. Venous: The inferior vena cava is normal in size with greater than 50% respiratory variability, suggesting right atrial pressure of 3 mmHg. IAS/Shunts: No atrial level shunt detected by color flow Doppler. Additional Comments: A device lead is visualized in the right ventricle and right atrium. There is a large pleural effusion.  LEFT VENTRICLE PLAX 2D LVIDd:         4.10 cm   Diastology LVIDs:         3.30 cm   LV e'  medial:    5.87 cm/s LV PW:         1.00 cm   LV E/e' medial:  17.0 LV IVS:        1.10 cm   LV e' lateral:   8.81 cm/s LVOT diam:     1.70 cm   LV E/e' lateral: 11.3 LV SV:         50 LV SV Index:   32 LVOT Area:     2.27 cm  RIGHT VENTRICLE             IVC RV S prime:     10.60 cm/s  IVC diam: 1.50 cm TAPSE (M-mode): 2.2 cm LEFT ATRIUM             Index        RIGHT ATRIUM           Index LA diam:        3.70 cm 2.33 cm/m   RA Area:     19.10 cm LA Vol (A2C):   53.6 ml 33.73 ml/m  RA Volume:   60.00 ml  37.76 ml/m LA Vol (A4C):   52.2 ml 32.85 ml/m LA Biplane Vol: 54.7 ml 34.43 ml/m  AORTIC VALVE LVOT Vmax:   104.00 cm/s LVOT Vmean:  69.200 cm/s LVOT VTI:    0.221 m  AORTA Ao Root diam: 2.40 cm MITRAL VALVE               TRICUSPID VALVE MV Area (PHT): 3.89 cm    TR Peak grad:   35.8 mmHg MV Decel Time: 195 msec    TR Vmax:        299.00 cm/s MV E velocity: 99.60 cm/s MV A velocity: 65.00 cm/s  SHUNTS MV E/A ratio:  1.53        Systemic VTI:  0.22 m                            Systemic Diam: 1.70 cm Emeline Calender Electronically signed by Emeline Calender Signature Date/Time: 08/01/2024/11:52:37 AM    Final    DG Chest Port 1 View Result Date: 07/24/2024 CLINICAL DATA:  Shortness of breath. EXAM: PORTABLE CHEST 1 VIEW COMPARISON:  09/26/2022 FINDINGS: Interstitial and patchy airspace disease is seen in both lung bases with small bilateral pleural effusions. Cardiopericardial silhouette is at upper limits of normal for size. Single lead left-sided AICD evident. Telemetry leads overlie the chest. IMPRESSION: Interstitial and patchy airspace disease in both lung bases with small bilateral pleural effusions. Imaging features suggest dependent pulmonary edema although pneumonia not excluded. Electronically Signed   By: Camellia Candle M.D.   On: 07/24/2024 10:59    Microbiology: Recent Results (from the past 240 hours)  Expectorated Sputum Assessment w Gram Stain, Rflx to Resp Cult     Status: None   Collection  Time: 08/04/24 11:48 AM   Specimen: Sputum  Result Value Ref Range Status   Specimen Description SPU  Final   Special Requests SPU  Final   Sputum evaluation   Final    THIS SPECIMEN IS ACCEPTABLE FOR SPUTUM CULTURE Performed at [facility], [address] [place], [place] [address]    Report Status 08/05/2024 FINAL  Final  Culture, Respiratory w Gram Stain     Status: None   Collection Time: 08/04/24 11:48 AM   Specimen: Sputum  Result Value Ref Range Status   Specimen Description SPU  Final   Special Requests SPU Reflexed from [id number]  Final   Gram Stain   Final    RARE WBC SEEN NO ORGANISMS SEEN Performed at [facility], [address] [place], [place] [address]    Culture FEW PSEUDOMONAS AERUGINOSA  Final   Report Status 08/08/2024 FINAL  Final   Organism ID, Bacteria PSEUDOMONAS AERUGINOSA  Final      Susceptibility   Pseudomonas aeruginosa - MIC*    MEROPENEM 2 SENSITIVE Sensitive     CIPROFLOXACIN <=0.06 SENSITIVE Sensitive     IMIPENEM >=16 RESISTANT Resistant     PIP/TAZO Value in next row Sensitive      <=4 SENSITIVEThis is a modified FDA-approved test that has been validated and its performance characteristics determined by the reporting laboratory.  This laboratory is certified under the Clinical Laboratory Improvement Amendments CLIA as qualified to perform high complexity clinical laboratory testing.    CEFEPIME  Value in next row Sensitive      <=4 SENSITIVEThis is a modified FDA-approved test that has been validated and its performance characteristics determined by the reporting laboratory.  This laboratory is certified under the Clinical Laboratory Improvement Amendments CLIA as qualified to perform high complexity clinical laboratory testing.    CEFTAZIDIME/AVIBACTAM Value in next row Sensitive      <=4 SENSITIVEThis is a modified FDA-approved test that has been validated and its performance characteristics determined by the reporting laboratory.   This laboratory is certified under the Clinical Laboratory Improvement Amendments CLIA as qualified to perform high complexity clinical laboratory testing.    CEFTOLOZANE/TAZOBACTAM Value in next row Sensitive      <=4 SENSITIVEThis is a modified FDA-approved test that has been validated and its performance characteristics determined by the reporting laboratory.  This laboratory is certified under the Clinical Laboratory Improvement Amendments CLIA as qualified to perform high complexity clinical laboratory testing.    TOBRAMYCIN Value in next row Sensitive      <=4 SENSITIVEThis is a modified FDA-approved test that has been validated and its performance characteristics determined by the reporting laboratory.  This laboratory is certified under the Clinical Laboratory Improvement Amendments CLIA as qualified to perform high complexity clinical laboratory testing.  CEFTAZIDIME Value in next row Sensitive      <=4 SENSITIVEThis is a modified FDA-approved test that has been validated and its performance characteristics determined by the reporting laboratory.  This laboratory is certified under the Clinical Laboratory Improvement Amendments CLIA as qualified to perform high complexity clinical laboratory testing.    * FEW PSEUDOMONAS AERUGINOSA  MRSA Next Gen by PCR, Nasal     Status: None   Collection Time: 08/04/24 12:30 PM   Specimen: Nasal Mucosa; Nasal Swab  Result Value Ref Range Status   MRSA by PCR Next Gen NOT DETECTED NOT DETECTED Final    Comment: (NOTE) The GeneXpert MRSA Assay (FDA approved for NASAL specimens only), is one component of a comprehensive MRSA colonization surveillance program. It is not intended to diagnose MRSA infection nor to guide or monitor treatment for MRSA infections. Test performance is not FDA approved in patients less than 78 years old. Performed at [facility], [address], [place], [place] [address]   MRSA Next Gen by PCR, Nasal     Status: None    Collection Time: 08/06/24 12:57 AM   Specimen: Nasal Mucosa; Nasal Swab  Result Value Ref Range Status   MRSA by PCR Next Gen NOT DETECTED NOT DETECTED Final    Comment: (NOTE) The GeneXpert MRSA Assay (FDA approved for NASAL specimens only), is one component of a comprehensive MRSA colonization surveillance program. It is not intended to diagnose MRSA infection nor to guide or monitor treatment for MRSA infections. Test performance is not FDA approved in patients less than 43 years old. Performed at [facility], [address] [place], [place] [address]   Respiratory (~20 pathogens) panel by PCR     Status: None   Collection Time: 08/06/24  8:17 AM   Specimen: Nasopharyngeal Swab; Respiratory  Result Value Ref Range Status   Adenovirus NOT DETECTED NOT DETECTED Final   Coronavirus 229E NOT DETECTED NOT DETECTED Final    Comment: (NOTE) The Coronavirus on the Respiratory Panel, DOES NOT test for the novel  Coronavirus (2019 nCoV)    Coronavirus HKU1 NOT DETECTED NOT DETECTED Final   Coronavirus NL63 NOT DETECTED NOT DETECTED Final   Coronavirus OC43 NOT DETECTED NOT DETECTED Final   Metapneumovirus NOT DETECTED NOT DETECTED Final   Rhinovirus / Enterovirus NOT DETECTED NOT DETECTED Final   Influenza A NOT DETECTED NOT DETECTED Final   Influenza B NOT DETECTED NOT DETECTED Final   Parainfluenza Virus 1 NOT DETECTED NOT DETECTED Final   Parainfluenza Virus 2 NOT DETECTED NOT DETECTED Final   Parainfluenza Virus 3 NOT DETECTED NOT DETECTED Final   Parainfluenza Virus 4 NOT DETECTED NOT DETECTED Final   Respiratory Syncytial Virus NOT DETECTED NOT DETECTED Final   Bordetella pertussis NOT DETECTED NOT DETECTED Final   Bordetella Parapertussis NOT DETECTED NOT DETECTED Final   Chlamydophila pneumoniae NOT DETECTED NOT DETECTED Final   Mycoplasma pneumoniae NOT DETECTED NOT DETECTED Final    Comment: Performed at [facility], [address], [place], [place]  [address]  Surgical pcr screen     Status: None   Collection Time: 08/07/24  1:58 AM   Specimen: Nasal Mucosa; Nasal Swab  Result Value Ref Range Status   MRSA, PCR NEGATIVE NEGATIVE Final   Staphylococcus aureus NEGATIVE NEGATIVE Final    Comment: (NOTE) The Xpert SA Assay (FDA approved for NASAL specimens in patients 67 years of age and older), is one component of a comprehensive  surveillance program. It is not intended to diagnose infection nor to guide or monitor treatment. Performed at [facility], [address] [place], [place] [address]   Expectorated Sputum Assessment w Gram Stain, Rflx to Resp Cult     Status: None   Collection Time: 08/09/24  7:44 AM   Specimen: Expectorated Sputum  Result Value Ref Range Status   Specimen Description EXPECTORATED SPUTUM  Final   Special Requests NONE  Final   Sputum evaluation   Final    THIS SPECIMEN IS ACCEPTABLE FOR SPUTUM CULTURE Performed at [facility], [address] [place], [place] [address]    Report Status 08/09/2024 FINAL  Final  Culture, Respiratory w Gram Stain     Status: None   Collection Time: 08/09/24  7:44 AM  Result Value Ref Range Status   Specimen Description EXPECTORATED SPUTUM  Final   Special Requests NONE Reflexed from [id number]  Final   Gram Stain   Final    FEW WBC PRESENT, PREDOMINANTLY PMN NO ORGANISMS SEEN Performed at [facility], [address] [place], [place] [address]    Culture FEW KLEBSIELLA OXYTOCA  Final   Report Status 08/11/2024 FINAL  Final   Organism ID, Bacteria KLEBSIELLA OXYTOCA  Final      Susceptibility   Klebsiella oxytoca - MIC*    AMPICILLIN >=32 RESISTANT Resistant     CEFEPIME  <=0.12 SENSITIVE Sensitive     ERTAPENEM <=0.12 SENSITIVE Sensitive     CEFTRIAXONE  1 SENSITIVE Sensitive     CIPROFLOXACIN <=0.06 SENSITIVE Sensitive     GENTAMICIN  <=1 SENSITIVE Sensitive     MEROPENEM <=0.25 SENSITIVE Sensitive     TRIMETH/SULFA <=20 SENSITIVE Sensitive      AMPICILLIN/SULBACTAM >=32 RESISTANT Resistant     PIP/TAZO Value in next row Resistant      >=128 RESISTANTThis is a modified FDA-approved test that has been validated and its performance characteristics determined by the reporting laboratory.  This laboratory is certified under the Clinical Laboratory Improvement Amendments CLIA as qualified to perform high complexity clinical laboratory testing.    * FEW KLEBSIELLA OXYTOCA     Labs: Basic Metabolic Panel: Recent Labs  Lab 08/08/24 0543 08/09/24 0520 08/10/24 0615 08/11/24 0500 08/12/24 0229 08/13/24 0532  NA 130* 130* 129* 128* 128* 127*  K 4.1 4.7 4.3 3.9 4.3 3.9  CL 90* 92* 90* 89* 89* 89*  CO2 31 29 32 31 32 31  GLUCOSE 102* 109* 102* 101* 109* 103*  BUN 32* 30* 34* 34* 35* 33*  CREATININE 1.02* 0.96 1.16* 1.40* 1.35* 1.29*  CALCIUM  9.3 9.4 9.5 9.6 9.3 9.3  MG 2.1 2.0 1.9 1.9 1.9  --    Liver Function Tests: No results for input(s): AST, ALT, ALKPHOS, BILITOT, PROT, ALBUMIN  in the last 168 hours. No results for input(s): LIPASE, AMYLASE in the last 168 hours. No results for input(s): AMMONIA in the last 168 hours. CBC: Recent Labs  Lab 08/09/24 0520 08/10/24 0615 08/11/24 0500 08/12/24 0229 08/13/24 0532  WBC 10.9* 14.4* 15.5* 18.0* 12.9*  HGB 8.5* 8.2* 8.6* 7.7* 7.2*  HCT 25.1* 24.1* 25.2* 23.3* 21.3*  MCV 90.6 90.6 92.0 94.0 93.4  PLT 340 335 392 365 323   Cardiac Enzymes: No results for input(s): CKTOTAL, CKMB, CKMBINDEX, TROPONINI in the last 168 hours. BNP: BNP (last 3 results) Recent Labs    11/22/23 1129 03/26/24 1423 05/29/24 1206  BNP 472.0* 614.1* 841.1*    ProBNP (last 3  results) Recent Labs    07/24/24 1016 08/02/24 1551 08/09/24 1626  PROBNP 4,537.0* 5,039.0* 8,218.0*    CBG: Recent Labs  Lab 08/12/24 2056 08/13/24 0547 08/13/24 1104 08/13/24 1740 08/13/24 1813  GLUCAP 124* 118* 135* 145* 147*       Signed:  Sigurd Pac MD.  [facility] 08/14/2024, 11:25 AM       [1]  Allergies Allergen Reactions   Lisinopril  Cough    Dry cough

## 2024-08-14 NOTE — Progress Notes (Signed)
 "                                                                                                                                                                                                         Palliative Medicine Progress Note   Patient Name: Pam [name]       Date: 08/14/2024 DOB: 1951/03/08  Age: 74 y.o. MRN#: [id number] Attending Physician: Fairy Frames, MD Primary Care Physician: Andrew Truman GRADE., MD Admit Date: 07/24/2024   HPI/Patient Profile: 74 y.o. female  with past medical history of chronic systolic heart failure, ischemic cardiomyopathy s/p AICD, and CAD with chronically occluded LAD who was admitted on 07/24/2024 with critical limb ischemia and is status post right axillary bifemoral bypass on 07/29/2024.  Hospitalization has been complicated by acute respiratory failure secondary to worsening pulmonary edema, acute on chronic CHF, and V-tach status post ICD shock x 4.   Palliative Medicine has been consulted for goals of care discussions and complex medical decision making.   Subjective: Chart reviewed. Note rapid response called overnight due to worsening respiratory status.   Bedside visit. Patient is currently on HFNC at 30 L.  I discussed with patient and husband my concern that she is not stable to transfer home with hospice. I offered and explained that she may be able to transfer to an inpatient hospice facility if oxygen can be weaned down to 15 L.   Reviewed hospice philosophy as de-escalating medical interventions and allowing a natural course to occur. Again discussed turning off patient's ICD to prevent it from firing and causing discomfort during the dying process - explained it would need to be deactivated prior to transfer to the hospice facility.  Additional time spent coordinating and discussing plan of care with attending MD, Pam [name] team, and hospice liaison. I also personally spoke with device rep for [employer] and confirmed that patient's ICD had  been deactivated.    Objective:  Physical Exam Vitals reviewed.  Constitutional:      General: She is not in acute distress.    Appearance: She is ill-appearing.  Cardiovascular:     Rate and Rhythm: Normal rate.  Pulmonary:     Effort: Accessory muscle usage present. No respiratory distress.  Neurological:     Mental Status: She is alert and oriented to person, place, and time.             Vital Signs: BP (!) 148/79 (BP Location: Left Leg)   Pulse 82   Temp 98.8 F (37.1 C) (Oral)   Resp (!) 24  Ht 5' 3 (1.6 m)   Wt 61.3 kg   SpO2 100%   BMI 23.93 kg/m  SpO2: SpO2: 100 % O2 Device: O2 Device: Heated High Flow Nasal Cannula O2 Flow Rate: O2 Flow Rate (L/min): (S) 30 L/min    Palliative Medicine Assessment & Plan   Assessment: Principal Problem:   Heart failure with reduced ejection fraction (HCC) Active Problems:   Hypothyroidism   Hyponatremia   Essential hypertension   Acute respiratory failure with hypoxia (HCC)   Elevated troponin   Pressure injury of skin   Critical limb ischemia of right lower extremity (HCC)   COPD (chronic obstructive pulmonary disease) (HCC)   Limb ischemia   Malnutrition of moderate degree    Recommendations/Plan: Continue comfort focused care Transfer to [employer] oxygen down as tolerated based on comfort - Give benzos and/or opioids for shortness of breath, dyspnea, increased work of breathing or respiratory rate over 22 PMT will continue to follow and support  Symptom Management:  Morphine  prn for pain or dyspnea Lorazepam  (ATIVAN ) prn for anxiety Haloperidol  (HALDOL ) prn for agitation  Glycopyrrolate  (ROBINUL ) for excessive secretions Ondansetron  (ZOFRAN ) prn for nausea Polyvinyl alcohol  (LIQUIFILM TEARS) prn for dry eyes Antiseptic oral rinse (BIOTENE) prn for dry mouth   Code Status: DNR - comfort   Prognosis:  < 2 weeks  Discharge Planning: Hospice facility    Thank you for allowing the  Palliative Medicine Team to assist in the care of this patient.   I personally spent a total of 55 minutes in the care of the patient today including preparing to see the patient, getting/reviewing separately obtained history, performing a medically appropriate exam/evaluation, counseling and educating, placing orders, documenting clinical information in the EHR, and coordinating care.    Laporcha Marchesi B Lylianna Fraiser, NP   Please contact Palliative Medicine Team phone at [phone] for questions and concerns.  For individual providers, please see AMION.      "

## 2024-08-16 ENCOUNTER — Ambulatory Visit (HOSPITAL_COMMUNITY): Admitting: Cardiology

## 2024-08-19 ENCOUNTER — Encounter (HOSPITAL_COMMUNITY): Payer: Self-pay | Admitting: Vascular Surgery

## 2024-08-20 ENCOUNTER — Ambulatory Visit: Payer: Federal, State, Local not specified - PPO

## 2024-09-01 DEATH — deceased

## 2024-09-30 ENCOUNTER — Other Ambulatory Visit

## 2024-10-02 ENCOUNTER — Ambulatory Visit: Admitting: Endocrinology

## 2024-11-19 ENCOUNTER — Ambulatory Visit: Payer: Federal, State, Local not specified - PPO

## 2025-02-18 ENCOUNTER — Ambulatory Visit: Payer: Federal, State, Local not specified - PPO

## 2025-05-20 ENCOUNTER — Ambulatory Visit: Payer: Federal, State, Local not specified - PPO

## 2025-08-19 ENCOUNTER — Ambulatory Visit: Payer: Federal, State, Local not specified - PPO
